# Patient Record
Sex: Male | Born: 1955 | Race: White | Hispanic: No | Smoking: Former smoker
Health system: Southern US, Community
[De-identification: ages and names within clinical notes are randomized; demographics above are authoritative.]

## PROBLEM LIST (undated history)

## (undated) DIAGNOSIS — I10 Essential (primary) hypertension: Secondary | ICD-10-CM

## (undated) DIAGNOSIS — T148XXA Other injury of unspecified body region, initial encounter: Secondary | ICD-10-CM

## (undated) DIAGNOSIS — E119 Type 2 diabetes mellitus without complications: Secondary | ICD-10-CM

## (undated) DIAGNOSIS — L24A9 Irritant contact dermatitis due friction or contact with other specified body fluids: Secondary | ICD-10-CM

## (undated) DIAGNOSIS — M1990 Unspecified osteoarthritis, unspecified site: Secondary | ICD-10-CM

## (undated) HISTORY — PX: NO PAST SURGERIES: SHX2092

## (undated) HISTORY — PX: COLONOSCOPY: SHX174

---

## 1998-08-20 ENCOUNTER — Emergency Department (HOSPITAL_COMMUNITY): Admission: EM | Admit: 1998-08-20 | Discharge: 1998-08-20 | Payer: Self-pay | Admitting: Emergency Medicine

## 1998-08-20 ENCOUNTER — Encounter: Payer: Self-pay | Admitting: Emergency Medicine

## 2006-01-23 ENCOUNTER — Ambulatory Visit (HOSPITAL_COMMUNITY): Admission: RE | Admit: 2006-01-23 | Discharge: 2006-01-23 | Payer: Self-pay | Admitting: Family Medicine

## 2006-04-27 ENCOUNTER — Encounter (HOSPITAL_BASED_OUTPATIENT_CLINIC_OR_DEPARTMENT_OTHER): Admission: RE | Admit: 2006-04-27 | Discharge: 2006-07-26 | Payer: Self-pay | Admitting: Surgery

## 2006-07-02 ENCOUNTER — Encounter: Admission: RE | Admit: 2006-07-02 | Discharge: 2006-07-02 | Payer: Self-pay | Admitting: Family Medicine

## 2006-07-30 ENCOUNTER — Encounter (HOSPITAL_BASED_OUTPATIENT_CLINIC_OR_DEPARTMENT_OTHER): Admission: RE | Admit: 2006-07-30 | Discharge: 2006-10-28 | Payer: Self-pay | Admitting: Internal Medicine

## 2013-07-09 ENCOUNTER — Inpatient Hospital Stay (HOSPITAL_COMMUNITY)
Admission: EM | Admit: 2013-07-09 | Discharge: 2013-07-18 | DRG: 728 | Disposition: A | Discharge status: Home Health | Payer: Medicare HMO | Attending: Internal Medicine | Admitting: Internal Medicine

## 2013-07-09 ENCOUNTER — Encounter (HOSPITAL_COMMUNITY): Payer: Self-pay | Admitting: Emergency Medicine

## 2013-07-09 DIAGNOSIS — Z6841 Body Mass Index (BMI) 40.0 and over, adult: Secondary | ICD-10-CM

## 2013-07-09 DIAGNOSIS — Z79899 Other long term (current) drug therapy: Secondary | ICD-10-CM

## 2013-07-09 DIAGNOSIS — E1165 Type 2 diabetes mellitus with hyperglycemia: Secondary | ICD-10-CM

## 2013-07-09 DIAGNOSIS — F17200 Nicotine dependence, unspecified, uncomplicated: Secondary | ICD-10-CM | POA: Diagnosis present

## 2013-07-09 DIAGNOSIS — R609 Edema, unspecified: Secondary | ICD-10-CM | POA: Diagnosis present

## 2013-07-09 DIAGNOSIS — N498 Inflammatory disorders of other specified male genital organs: Principal | ICD-10-CM | POA: Diagnosis present

## 2013-07-09 DIAGNOSIS — I861 Scrotal varices: Secondary | ICD-10-CM | POA: Diagnosis present

## 2013-07-09 DIAGNOSIS — D72829 Elevated white blood cell count, unspecified: Secondary | ICD-10-CM | POA: Diagnosis present

## 2013-07-09 DIAGNOSIS — R319 Hematuria, unspecified: Secondary | ICD-10-CM | POA: Diagnosis present

## 2013-07-09 DIAGNOSIS — N433 Hydrocele, unspecified: Secondary | ICD-10-CM | POA: Diagnosis present

## 2013-07-09 DIAGNOSIS — IMO0002 Reserved for concepts with insufficient information to code with codable children: Secondary | ICD-10-CM | POA: Diagnosis present

## 2013-07-09 DIAGNOSIS — I872 Venous insufficiency (chronic) (peripheral): Secondary | ICD-10-CM | POA: Diagnosis present

## 2013-07-09 DIAGNOSIS — IMO0001 Reserved for inherently not codable concepts without codable children: Secondary | ICD-10-CM

## 2013-07-09 DIAGNOSIS — L97909 Non-pressure chronic ulcer of unspecified part of unspecified lower leg with unspecified severity: Secondary | ICD-10-CM | POA: Diagnosis present

## 2013-07-09 DIAGNOSIS — N492 Inflammatory disorders of scrotum: Secondary | ICD-10-CM | POA: Diagnosis present

## 2013-07-09 HISTORY — DX: Type 2 diabetes mellitus without complications: E11.9

## 2013-07-09 NOTE — ED Notes (Signed)
Pt arrived to the ED with a complaint of testicle pain.  Pt states that the pain started on Friday.  Pt wife states that the color has been red but that one time today she states they appeared bluish.  Pt states that he has been having difficulty urinating.  Pt states it has been painful to urinate.

## 2013-07-10 ENCOUNTER — Emergency Department (HOSPITAL_COMMUNITY): Payer: Medicare HMO

## 2013-07-10 ENCOUNTER — Encounter (HOSPITAL_COMMUNITY): Payer: Self-pay

## 2013-07-10 DIAGNOSIS — N492 Inflammatory disorders of scrotum: Secondary | ICD-10-CM | POA: Diagnosis present

## 2013-07-10 DIAGNOSIS — N498 Inflammatory disorders of other specified male genital organs: Principal | ICD-10-CM

## 2013-07-10 LAB — POCT I-STAT, CHEM 8
BUN: 27 mg/dL — ABNORMAL HIGH (ref 6–23)
Calcium, Ion: 1.09 mmol/L — ABNORMAL LOW (ref 1.12–1.23)
Chloride: 100 mEq/L (ref 96–112)
Creatinine, Ser: 1 mg/dL (ref 0.50–1.35)
Glucose, Bld: 363 mg/dL — ABNORMAL HIGH (ref 70–99)
HCT: 50 % (ref 39.0–52.0)
Hemoglobin: 17 g/dL (ref 13.0–17.0)
Potassium: 4.1 mEq/L (ref 3.7–5.3)
Sodium: 133 mEq/L — ABNORMAL LOW (ref 137–147)
TCO2: 21 mmol/L (ref 0–100)

## 2013-07-10 LAB — URINALYSIS, ROUTINE W REFLEX MICROSCOPIC
Glucose, UA: >1000 mg/dL — AB
Hgb urine dipstick: NEGATIVE
Ketones, ur: 15 mg/dL — AB
Nitrite: NEGATIVE
Protein, ur: 100 mg/dL — AB
Specific Gravity, Urine: >1.046 — ABNORMAL HIGH (ref 1.005–1.030)
Urobilinogen, UA: 4 mg/dL — ABNORMAL HIGH (ref 0.0–1.0)
pH: 5 (ref 5.0–8.0)

## 2013-07-10 LAB — CBC
HCT: 45.5 % (ref 39.0–52.0)
Hemoglobin: 15.8 g/dL (ref 13.0–17.0)
MCH: 29.6 pg (ref 26.0–34.0)
MCHC: 34.7 g/dL (ref 30.0–36.0)
MCV: 85.4 fL (ref 78.0–100.0)
Platelets: 177 10*3/uL (ref 150–400)
RBC: 5.33 MIL/uL (ref 4.22–5.81)
RDW: 14.1 % (ref 11.5–15.5)
WBC: 19.4 10*3/uL — ABNORMAL HIGH (ref 4.0–10.5)

## 2013-07-10 LAB — COMPREHENSIVE METABOLIC PANEL
ALT: 12 U/L (ref 0–53)
AST: 13 U/L (ref 0–37)
Albumin: 2.9 g/dL — ABNORMAL LOW (ref 3.5–5.2)
Alkaline Phosphatase: 103 U/L (ref 39–117)
BUN: 24 mg/dL — ABNORMAL HIGH (ref 6–23)
CO2: 20 mEq/L (ref 19–32)
Calcium: 8.5 mg/dL (ref 8.4–10.5)
Chloride: 95 mEq/L — ABNORMAL LOW (ref 96–112)
Creatinine, Ser: 0.99 mg/dL (ref 0.50–1.35)
GFR calc Af Amer: >90 mL/min (ref >90)
GFR calc non Af Amer: 90 mL/min — ABNORMAL LOW (ref >90)
Glucose, Bld: 358 mg/dL — ABNORMAL HIGH (ref 70–99)
Potassium: 4.3 mEq/L (ref 3.7–5.3)
Sodium: 132 mEq/L — ABNORMAL LOW (ref 137–147)
Total Bilirubin: 1.3 mg/dL — ABNORMAL HIGH (ref 0.3–1.2)
Total Protein: 7.7 g/dL (ref 6.0–8.3)

## 2013-07-10 LAB — BASIC METABOLIC PANEL
BUN: 24 mg/dL — ABNORMAL HIGH (ref 6–23)
CO2: 23 mEq/L (ref 19–32)
Calcium: 8.4 mg/dL (ref 8.4–10.5)
Chloride: 97 mEq/L (ref 96–112)
Creatinine, Ser: 0.83 mg/dL (ref 0.50–1.35)
GFR calc Af Amer: >90 mL/min (ref >90)
GFR calc non Af Amer: >90 mL/min (ref >90)
Glucose, Bld: 321 mg/dL — ABNORMAL HIGH (ref 70–99)
Potassium: 4.1 mEq/L (ref 3.7–5.3)
Sodium: 132 mEq/L — ABNORMAL LOW (ref 137–147)

## 2013-07-10 LAB — HEMOGLOBIN A1C
Hgb A1c MFr Bld: 11 % — ABNORMAL HIGH (ref <5.7)
Mean Plasma Glucose: 269 mg/dL — ABNORMAL HIGH (ref <117)

## 2013-07-10 LAB — URINE MICROSCOPIC-ADD ON

## 2013-07-10 LAB — GLUCOSE, CAPILLARY: Glucose-Capillary: 308 mg/dL — ABNORMAL HIGH (ref 70–99)

## 2013-07-10 LAB — CG4 I-STAT (LACTIC ACID): Lactic Acid, Venous: 2.06 mmol/L (ref 0.5–2.2)

## 2013-07-10 MED ORDER — ONDANSETRON HCL 4 MG/2ML IJ SOLN
4.0000 mg | Freq: Once | INTRAMUSCULAR | Status: AC
  Administered 2013-07-10: 4 mg via INTRAVENOUS
  Filled 2013-07-10: qty 2

## 2013-07-10 MED ORDER — ZOLPIDEM TARTRATE 5 MG PO TABS: 5.0000 mg | ORAL_TABLET | Freq: Every evening | ORAL | Status: DC | PRN

## 2013-07-10 MED ORDER — SODIUM CHLORIDE 0.9 % IV SOLN
INTRAVENOUS | Status: DC
  Administered 2013-07-10 – 2013-07-18 (×6): via INTRAVENOUS

## 2013-07-10 MED ORDER — POLYETHYLENE GLYCOL 3350 17 G PO PACK
17.0000 g | PACK | Freq: Every day | ORAL | Status: DC | PRN
  Filled 2013-07-10: qty 1

## 2013-07-10 MED ORDER — INSULIN ASPART 100 UNIT/ML ~~LOC~~ SOLN
0.0000 [IU] | Freq: Three times a day (TID) | SUBCUTANEOUS | Status: DC
  Administered 2013-07-10: 15 [IU] via SUBCUTANEOUS
  Administered 2013-07-11 (×3): 11 [IU] via SUBCUTANEOUS
  Administered 2013-07-12: 7 [IU] via SUBCUTANEOUS
  Administered 2013-07-12: 11 [IU] via SUBCUTANEOUS
  Administered 2013-07-12: 15:00:00 via SUBCUTANEOUS
  Administered 2013-07-13 (×3): 7 [IU] via SUBCUTANEOUS
  Administered 2013-07-14 (×3): 4 [IU] via SUBCUTANEOUS
  Administered 2013-07-15 (×2): 3 [IU] via SUBCUTANEOUS
  Administered 2013-07-16: 4 [IU] via SUBCUTANEOUS
  Administered 2013-07-16 – 2013-07-17 (×3): 3 [IU] via SUBCUTANEOUS

## 2013-07-10 MED ORDER — OXYCODONE HCL 5 MG PO TABS
5.0000 mg | ORAL_TABLET | ORAL | Status: DC | PRN
  Administered 2013-07-10 – 2013-07-16 (×7): 5 mg via ORAL
  Filled 2013-07-10 (×7): qty 1

## 2013-07-10 MED ORDER — HYDROMORPHONE HCL PF 1 MG/ML IJ SOLN
1.0000 mg | INTRAMUSCULAR | Status: DC | PRN
  Administered 2013-07-10 – 2013-07-17 (×7): 1 mg via INTRAVENOUS
  Filled 2013-07-10 (×8): qty 1

## 2013-07-10 MED ORDER — PIPERACILLIN-TAZOBACTAM 3.375 G IVPB
3.3750 g | Freq: Three times a day (TID) | INTRAVENOUS | Status: DC
  Filled 2013-07-10: qty 50

## 2013-07-10 MED ORDER — ONDANSETRON HCL 4 MG PO TABS: 4.0000 mg | ORAL_TABLET | Freq: Four times a day (QID) | ORAL | Status: DC | PRN

## 2013-07-10 MED ORDER — ONDANSETRON HCL 4 MG/2ML IJ SOLN: 4.0000 mg | Freq: Four times a day (QID) | INTRAMUSCULAR | Status: DC | PRN

## 2013-07-10 MED ORDER — PIPERACILLIN-TAZOBACTAM 3.375 G IVPB
3.3750 g | Freq: Four times a day (QID) | INTRAVENOUS | Status: DC
  Filled 2013-07-10 (×2): qty 50

## 2013-07-10 MED ORDER — PIPERACILLIN-TAZOBACTAM 3.375 G IVPB
3.3750 g | Freq: Four times a day (QID) | INTRAVENOUS | Status: DC
  Administered 2013-07-10 – 2013-07-18 (×31): 3.375 g via INTRAVENOUS
  Filled 2013-07-10 (×36): qty 50

## 2013-07-10 MED ORDER — VANCOMYCIN HCL 10 G IV SOLR
2000.0000 mg | Freq: Once | INTRAVENOUS | Status: AC
  Administered 2013-07-10: 2000 mg via INTRAVENOUS
  Filled 2013-07-10: qty 2000

## 2013-07-10 MED ORDER — VANCOMYCIN HCL 10 G IV SOLR
1500.0000 mg | Freq: Two times a day (BID) | INTRAVENOUS | Status: DC
  Administered 2013-07-10 – 2013-07-12 (×5): 1500 mg via INTRAVENOUS
  Filled 2013-07-10 (×5): qty 1500

## 2013-07-10 MED ORDER — PIPERACILLIN-TAZOBACTAM 3.375 G IVPB 30 MIN
3.3750 g | INTRAVENOUS | Status: AC
  Administered 2013-07-10: 3.375 g via INTRAVENOUS
  Filled 2013-07-10: qty 50

## 2013-07-10 MED ORDER — PIPERACILLIN-TAZOBACTAM 3.375 G IVPB
3.3750 g | Freq: Once | INTRAVENOUS | Status: AC
  Administered 2013-07-10: 3.375 g via INTRAVENOUS
  Filled 2013-07-10: qty 50

## 2013-07-10 MED ORDER — INSULIN ASPART 100 UNIT/ML ~~LOC~~ SOLN
0.0000 [IU] | Freq: Every day | SUBCUTANEOUS | Status: DC
  Administered 2013-07-10 – 2013-07-12 (×3): 3 [IU] via SUBCUTANEOUS
  Administered 2013-07-13 – 2013-07-16 (×2): 2 [IU] via SUBCUTANEOUS

## 2013-07-10 MED ORDER — FENTANYL CITRATE 0.05 MG/ML IJ SOLN
50.0000 ug | INTRAMUSCULAR | Status: DC | PRN
  Administered 2013-07-10 (×2): 50 ug via INTRAVENOUS
  Filled 2013-07-10 (×2): qty 2

## 2013-07-10 MED ORDER — IOHEXOL 300 MG/ML  SOLN
100.0000 mL | Freq: Once | INTRAMUSCULAR | Status: AC | PRN
  Administered 2013-07-10: 100 mL via INTRAVENOUS

## 2013-07-10 NOTE — ED Notes (Signed)
Patient has been transferred to a hospital bed.

## 2013-07-10 NOTE — Consult Note (Signed)
Urology Consult  CC: Referring physician: Dr. Rod Can Reason for referral: Scrotal cellulitis  History of Present Illness: [name] is a 58 year old male who reports he has never been previously diagnosed with diabetes. 2 days ago he began to have some discomfort in the area of the scrotum. This progressed to pain. It was not associated with any urinary symptoms such as dysuria or hematuria. He also denies any objective fever but felt as if he might have had a low-grade fever while at home. He has never had cellulitis in the past nor has he had any prior urologic difficulties. He reports there has been swelling of the scrotum. The pain in his scrotum is not relieved by positional change and is worsened by palpation. It's moderate in severity. He has been found to have an elevated white count and elevated serum glucose.  History reviewed. No pertinent past medical history. History reviewed. No pertinent past surgical history.  Medications: Prior to Admission:  (Not in a hospital admission) Scheduled:  Continuous:   Allergies: No Known Allergies  History reviewed. No pertinent family history.  Social History:  reports that he has been smoking Cigarettes.  He has been smoking about 1.00 pack per day. He does not have any smokeless tobacco history on file. He reports that he does not drink alcohol or use illicit drugs.  Review of Systems: Pertinent items are noted in HPI. A comprehensive review of systems was negative except as noted above.  Physical Exam:  Vital signs in last 24 hours: Temp:  [98.6 F (37 C)-98.8 F (37.1 C)] 98.6 F (37 C) (01/26 0528) Pulse Rate:  [89-98] 98 (01/26 0528) Resp:  [17-20] 20 (01/26 0528) BP: (98-107)/(53-72) 98/53 mmHg (01/26 0528) SpO2:  [90 %-96 %] 90 % (01/26 0528) Weight:  [204.119 kg (450 lb)] 204.119 kg (450 lb) (01/25 2110) General appearance: alert and appears stated age Head: Normocephalic, without obvious abnormality, atraumatic Eyes:  conjunctivae/corneas clear. EOM's intact.  Oropharynx: moist mucous membranes Neck: supple, symmetrical, trachea midline Resp: normal respiratory effort Cardio: regular rate and rhythm Back: symmetric, no curvature. ROM normal. No CVA tenderness. GI: soft, obese, non-tender; bowel sounds normal; no masses,  no organomegaly  Male genitalia: Phallus is enveloped by the scrotal skin but palpable. I was able to visualize the glans and it appeared normal. The scrotal skin is edematous with no crepitus or fluctuance. It is mildly erythematous and warm. It is slightly tender. The testis are difficult to palpate do to scrotal wall edema. Extremities: extremities normal, atraumatic, no cyanosis or edema Skin: Skin color normal. He has significant lower extremity venous stasis changes. Neurologic: Grossly normal  Laboratory Data:   Recent Labs  07/10/13 0035 07/10/13 0057  WBC 19.4*  --   HGB 15.8 17.0  HCT 45.5 50.0   BMET  Recent Labs  07/10/13 0035 07/10/13 0057  NA 132* 133*  K 4.3 4.1  CL 95* 100  CO2 20  --   GLUCOSE 358* 363*  BUN 24* 27*  CREATININE 0.99 1.00  CALCIUM 8.5  --    No results found for this basename: LABPT, INR,  in the last 72 hours No results found for this basename: LABURIN,  in the last 72 hours No results found for this or any previous visit. Creatinine:  Recent Labs  07/10/13 0035 07/10/13 0057  CREATININE 0.99 1.00    Imaging: Ct Pelvis W Contrast  07/10/2013   CLINICAL DATA:  Testicular pain and discoloration  EXAM: CT PELVIS  WITH CONTRAST  TECHNIQUE: Multidetector CT imaging of the pelvis was performed using the standard protocol following the bolus administration of intravenous contrast.  CONTRAST:  100mL OMNIPAQUE IOHEXOL 300 MG/ML  SOLN  COMPARISON:  07/10/2013 ultrasound  FINDINGS: Visualized intraperitoneal contents within normal limits.  Left greater than right pelvic sidewall adenopathy, measuring 2.7 cm on the left and 1.6 cm on the  right. Prominent bilateral inguinal chain adenopathy, left greater than right.  Marked scrotal wall edema and subcutaneous fat stranding of the perineum. No subcutaneous gas. No significant inguinal hernia.  No acute osseous finding.  No appreciable vascular abnormality.  IMPRESSION: Scrotal wall thickening/ edema. May reflect cellulitis. Fournier's gangrene not excluded.  Enlarged pelvic and inguinal lymph nodes, most pronounced along the left pelvic sidewall. Recommend follow up to document resolution/ exclude malignancy.   Electronically Signed   By: Jearld LeschAndrew  DelGaizo M.D.   On: 07/10/2013 03:01   [place] Scrotum  07/10/2013   CLINICAL DATA:  Testicular pain and swelling  EXAM: SCROTAL ULTRASOUND  DOPPLER ULTRASOUND OF THE TESTICLES  TECHNIQUE: Complete ultrasound examination of the testicles, epididymis, and other scrotal structures was performed. Color and spectral Doppler ultrasound were also utilized to evaluate blood flow to the testicles.  COMPARISON:  None.  FINDINGS: Right testicle  Measurements: 4.3 x 2.0 x 2.9 cm. No mass or microlithiasis visualized.  Left testicle  Measurements: 3.8 x 2.2 x 2.7 cm. No mass or microlithiasis visualized.  Right epididymis:  Normal in size and appearance.  Left epididymis:  Normal in size and appearance.  Hydrocele:  Trace on the left.  Varicocele:  None visualized.  Pulsed Doppler interrogation of both testes demonstrates low resistance arterial and venous waveforms bilaterally.  Other:  Diffuse scrotal wall edema.  IMPRESSION: Normal sonographic appearance to the testicles. Color Doppler flow with arterial and venous waveforms documented bilaterally.  Diffuse scrotal wall edema.   Electronically Signed   By: Jearld LeschAndrew  DelGaizo M.D.   On: 07/10/2013 01:07   [place] Art/ven Flow Abd Pelv Doppler  07/10/2013   CLINICAL DATA:  Testicular pain and swelling  EXAM: SCROTAL ULTRASOUND  DOPPLER ULTRASOUND OF THE TESTICLES  TECHNIQUE: Complete ultrasound examination of the testicles,  epididymis, and other scrotal structures was performed. Color and spectral Doppler ultrasound were also utilized to evaluate blood flow to the testicles.  COMPARISON:  None.  FINDINGS: Right testicle  Measurements: 4.3 x 2.0 x 2.9 cm. No mass or microlithiasis visualized.  Left testicle  Measurements: 3.8 x 2.2 x 2.7 cm. No mass or microlithiasis visualized.  Right epididymis:  Normal in size and appearance.  Left epididymis:  Normal in size and appearance.  Hydrocele:  Trace on the left.  Varicocele:  None visualized.  Pulsed Doppler interrogation of both testes demonstrates low resistance arterial and venous waveforms bilaterally.  Other:  Diffuse scrotal wall edema.  IMPRESSION: Normal sonographic appearance to the testicles. Color Doppler flow with arterial and venous waveforms documented bilaterally.  Diffuse scrotal wall edema.   Electronically Signed   By: Jearld LeschAndrew  DelGaizo M.D.   On: 07/10/2013 01:07   I have reviewed his CT scan and scrotal ultrasound images. The testicles appear normal and there appears to be no evidence of abscess or gas within the scrotal skin.  Impression/Assessment:  Scrotal cellulitis: It appears he has a significant scrotal cellulitis with edema of the scrotal skin but there is no evidence of crepitus or fluctuance and therefore no need for any form of surgical intervention at this time. I  have discussed with the patient that this condition can progress to a point where surgical intervention may be necessary despite appropriate antibiotic therapy although it would appear this has been discovered and is being treated adequately at a stage therefore abscess formation or Fournier's gangrene has formed. His white count is elevated and although he has not been diagnosed with diabetes previously it would appear from his blood sugar that he may be diabetic as well. He is currently on Zosyn and vancomycin which is adequate coverage.  Plan:  1. I agree completely with the current  antibiotic regimen. 2. Observation with serial examination but no need for surgical intervention at this time.  Bright Spielmann C 07/10/2013, 6:52 AM

## 2013-07-10 NOTE — Progress Notes (Addendum)
[facility] PROGRESS NOTE  [name] EVO:[id number] DOB: 24-Oct-1955 DOA: 07/09/2013 PCP: No primary provider on file.  Brief narrative: 58 year old male who presented to [facility] ED 07/09/2013 with scrotal edema. CT abd/pelvis revealed possible cellulitis and/or Fournier gangrene. Pt started on zosyn on admission. Normal scrotal ultrasound.  Assessment/Plan:  Principal Problem:   Cellulitis, scrotum - agree with the management of cellulitis with zosyn - appreciate GU consult and recommendations    Code Status: full code Family Communication: no family at the bedside Disposition Plan: home when stable   Manson Passey, MD  [facility] Pager [phone]  If 7PM-7AM, please contact night-coverage [email or web address] Password [email or web address] 07/10/2013, 6:12 PM   LOS: 1 day   Consultants:  Urology   Procedures:  None   Antibiotics:  Zosyn 07/10/2013 -->  vanco 07/10/2013 -->  HPI/Subjective: No acute events since admission.   Objective: Filed Vitals:   07/09/13 2110 07/10/13 0528 07/10/13 1123 07/10/13 1503  BP: 107/72 98/53 108/54 123/73  Pulse: 89 98 96 97  Temp: 98.8 F (37.1 C) 98.6 F (37 C) 98.8 F (37.1 C) 98.6 F (37 C)  TempSrc: Oral Oral Oral Oral  Resp: 17 20 14 16   Height: 6\' 1"  (1.854 m)     Weight: 204.119 kg (450 lb)     SpO2: 96% 90% 92% 94%   No intake or output data in the 24 hours ending 07/10/13 1812  Exam:   General:  Pt is alert, follows commands appropriately, not in acute distress  Cardiovascular: Regular rate and rhythm, S1/S2 appreciated  Respiratory: Clear to auscultation bilaterally, no wheezing, no crackles, no rhonchi  Abdomen: Soft, non tender, non distended, bowel sounds present, no guarding  Extremities: No edema, pulses DP and PT palpable bilaterally; perineal area cellulitis  Neuro: Grossly nonfocal  Data Reviewed: Basic Metabolic Panel:  Recent Labs Lab 07/10/13 0035 07/10/13 0057 07/10/13 1421  NA 132* 133*  132*  K 4.3 4.1 4.1  CL 95* 100 97  CO2 20  --  23  GLUCOSE 358* 363* 321*  BUN 24* 27* 24*  CREATININE 0.99 1.00 0.83  CALCIUM 8.5  --  8.4   Liver Function Tests:  Recent Labs Lab 07/10/13 0035  AST 13  ALT 12  ALKPHOS 103  BILITOT 1.3*  PROT 7.7  ALBUMIN 2.9*   No results found for this basename: LIPASE, AMYLASE,  in the last 168 hours No results found for this basename: AMMONIA,  in the last 168 hours CBC:  Recent Labs Lab 07/10/13 0035 07/10/13 0057  WBC 19.4*  --   HGB 15.8 17.0  HCT 45.5 50.0  MCV 85.4  --   PLT 177  --    Cardiac Enzymes: No results found for this basename: CKTOTAL, CKMB, CKMBINDEX, TROPONINI,  in the last 168 hours BNP: No components found with this basename: POCBNP,  CBG:  Recent Labs Lab 07/10/13 1555  GLUCAP 308*    No results found for this or any previous visit (from the past 240 hour(s)).   Studies: Ct Pelvis W Contrast 07/10/2013    IMPRESSION: Scrotal wall thickening/ edema. May reflect cellulitis. Fournier's gangrene not excluded.  Enlarged pelvic and inguinal lymph nodes, most pronounced along the left pelvic sidewall. Recommend follow up to document resolution/ exclude malignancy.     US Scrotum 07/10/2013    IMPRESSION: Normal sonographic appearance to the testicles. Color Doppler flow with arterial and venous waveforms documented bilaterally.  Diffuse scrotal wall edema.  [place] Art/ven Flow Abd Pelv Doppler 07/10/2013    IMPRESSION: Normal sonographic appearance to the testicles. Color Doppler flow with arterial and venous waveforms documented bilaterally.  Diffuse scrotal wall edema.      Scheduled Meds: . insulin aspart  0-20 Units Subcutaneous TID WC  . insulin aspart  0-5 Units Subcutaneous QHS  . piperacillin-tazobactam (ZOSYN)  IV  3.375 g Intravenous Q6H  . vancomycin  1,500 mg Intravenous Q12H   Continuous Infusions: . sodium chloride 100 mL/hr at 07/10/13 [phone]

## 2013-07-10 NOTE — Progress Notes (Signed)
ANTIBIOTIC CONSULT NOTE - INITIAL  Pharmacy Consult for Zosyn, vancomycin Indication: scrotal cellulitis  No Known Allergies  Patient Measurements: Height: 6\' 1"  (185.4 cm) Weight: 450 lb (204.119 kg) IBW/kg (Calculated) : 79.9   Vital Signs: Temp: 98.8 F (37.1 C) (01/26 1123) Temp src: Oral (01/26 1123) BP: 108/54 mmHg (01/26 1123) Pulse Rate: 96 (01/26 1123) Intake/Output from previous day:   Intake/Output from this shift:    Labs:  Recent Labs  07/10/13 0035 07/10/13 0057  WBC 19.4*  --   HGB 15.8 17.0  PLT 177  --   CREATININE 0.99 1.00   Estimated Creatinine Clearance: 151.2 ml/min (by C-G formula based on Cr of 1). No results found for this basename: VANCOTROUGH, VANCOPEAK, VANCORANDOM, GENTTROUGH, GENTPEAK, GENTRANDOM, TOBRATROUGH, TOBRAPEAK, TOBRARND, AMIKACINPEAK, AMIKACINTROU, AMIKACIN,  in the last 72 hours   Microbiology: No results found for this or any previous visit (from the past 720 hour(s)).  Medical History: Morbid obesity   Medications:  Scheduled:  . insulin aspart  0-20 Units Subcutaneous TID WC  . insulin aspart  0-5 Units Subcutaneous QHS   Infusions:  . sodium chloride 100 mL/hr at 07/10/13 0640  . piperacillin-tazobactam    . piperacillin-tazobactam (ZOSYN)  IV    . vancomycin     PRN: HYDROmorphone (DILAUDID) injection, ondansetron (ZOFRAN) IV, ondansetron, oxyCODONE, zolpidem  Assessment: 58 y/o M presented to ED with scrotal cellulitis.  Has already received Zosyn 3.375 grams IV x1 (0113) and vancomycin 2000 mg IV x 1 (0200).  Orders received to continue both antibiotics with pharmacy dosing.  Risk factors for Fournier's gangrene as outlined by MD are noted (morbid obesity, hyperglycemia with likely underlying DM).  Goal of Therapy:   Proper antibiotic dosing for weight and renal function; eradication of infection  Vancomycin trough 15-20  Plan:  1. Repeat Zosyn 3.375 grams IV over 30 mins x 1 stat 2. Zosyn 3.375 grams  IV q6h by extended infusion, each dose over 4 hours.  (Empirically increased dosage from the standard q8h extended infusion regimen due to morbid obesity). 3. Vancomycin 1500 mg IV q12h, next dose at 2pm 4. Check vancomycin trough at steady-state 5. Follow serum creatinine, any culture data, and clinical course.   Elie Goody, PharmD, BCPS Pager: [phone] 07/10/2013  12:56 PM

## 2013-07-10 NOTE — Progress Notes (Signed)
Inpatient Diabetes Program Recommendations  AACE/ADA: New Consensus Statement on Inpatient Glycemic Control (2013)  Target Ranges:  Prepandial:   less than 140 mg/dL      Peak postprandial:   less than 180 mg/dL (1-2 hours)      Critically ill patients:  140 - 180 mg/dL   Reason for Visit: Hyperglycemia  Briefly spoke with pt about hyperglycemia.  Awaiting HgbA1C results for firm diagnosis of DM.  Pt has symptoms of hyperglycemia , polyuria, polydipsia and has felt sluggish with no energy in last few weeks. Has positive family hx DM. Encouraged pt to view diabetes videos on pt ed channel and will order Living Well With Diabetes book. Pt interested in OP Diabetes Education. Discussed HgbA1C meaning very briefly.  Results for AMEN, STASZAK (MRN [id number]) as of 07/10/2013 17:25  Ref. Range 07/10/2013 15:55  Glucose-Capillary Latest Range: 70-99 mg/dL 308 (H)  Results for ABRHAM, MASLOWSKI (MRN [id number]) as of 07/10/2013 17:25  Ref. Range 07/10/2013 14:21  Sodium Latest Range: 137-147 mEq/L 132 (L)  Potassium Latest Range: 3.7-5.3 mEq/L 4.1  Chloride Latest Range: 96-112 mEq/L 97  CO2 Latest Range: 19-32 mEq/L 23  BUN Latest Range: 6-23 mg/dL 24 (H)  Creatinine Latest Range: 0.50-1.35 mg/dL 0.83  Calcium Latest Range: 8.4-10.5 mg/dL 8.4  GFR calc non Af Amer Latest Range: >90 mL/min >90  GFR calc Af Amer Latest Range: >90 mL/min >90  Glucose Latest Range: 70-99 mg/dL 321 (H)   Results for RITHIK, ODEA (MRN [id number]) as of 07/10/2013 17:25  Ref. Range 07/10/2013 00:35 07/10/2013 00:57 07/10/2013 14:21  Glucose Latest Range: 70-99 mg/dL 358 (H) 363 (H) 321 (H)   ?new diagnosis of DM. Will f/u in am with HgbA1C results.   Will probably need to go home on insulin.  If so will order starter kit and RN to begin education.  Thank you. Lorenda Peck, RD, LDN, CDE Inpatient Diabetes Coordinator [phone]

## 2013-07-10 NOTE — ED Provider Notes (Signed)
CSN: [id number]     Arrival date & time 07/09/13  2104 History   First MD Initiated Contact with Patient 07/09/13 2353     Chief Complaint  Patient presents with  . Testicle Pain   (Consider location/radiation/quality/duration/timing/severity/associated sxs/prior Treatment) HPI History provided by patient. Testicle pain and swelling and redness onset 3 days ago and progressively worsening. He has associated hard knot left inguinal region. He denies history of trauma or history of hernia in the past. He presents tonight because now he has associated difficulty urinating. He had some hematuria at home tonight. No dysuria. No urgency or frequency. No urinary retention. No abdominal pain otherwise. No back pain. No fevers or chills. No nausea vomiting. No history of same. is not diabetic.  History reviewed. No pertinent past medical history. History reviewed. No pertinent past surgical history. History reviewed. No pertinent family history. History  Substance Use Topics  . Smoking status: Current Some Day Smoker -- 1.00 packs/day    Types: Cigarettes  . Smokeless tobacco: Not on file  . Alcohol Use: No    Review of Systems  Constitutional: Negative for fever and chills.  Respiratory: Negative for shortness of breath.   Cardiovascular: Negative for chest pain.  Gastrointestinal: Negative for abdominal pain.  Genitourinary: Positive for hematuria, scrotal swelling, difficulty urinating and testicular pain.  Musculoskeletal: Negative for back pain.  Skin: Positive for rash.  Neurological: Negative for headaches.  All other systems reviewed and are negative.    Allergies  Review of patient's allergies indicates no known allergies.  Home Medications   Current Outpatient Rx  Name  Route  Sig  Dispense  Refill  . diphenhydrAMINE (BENADRYL) 25 MG tablet   Oral   Take 25 mg by mouth every 6 (six) hours as needed for itching or allergies.         . furosemide (LASIX) 20 MG tablet  Oral   Take 20 mg by mouth daily as needed for edema.         . naproxen sodium (ANAPROX) 220 MG tablet   Oral   Take 440 mg by mouth 2 (two) times daily as needed (pain).          BP 107/72  Pulse 89  Temp(Src) 98.8 F (37.1 C) (Oral)  Resp 17  Ht 6\' 1"  (1.854 m)  Wt 450 lb (204.119 kg)  BMI 59.38 kg/m2  SpO2 96% Physical Exam  Constitutional: He is oriented to person, place, and time. He appears well-developed and well-nourished.  HENT:  Head: Normocephalic and atraumatic.  Eyes: EOM are normal. Pupils are equal, round, and reactive to light.  Neck: Neck supple.  Cardiovascular: Normal rate, regular rhythm and intact distal pulses.   Pulmonary/Chest: Effort normal and breath sounds normal. No respiratory distress.  Abdominal: Soft. He exhibits no distension. There is no tenderness.  Obese  Genitourinary:  Scrotum is enlarged erythematous. No crepitus. Erythema extends to the inguinal region. There is fullness in the left inguinal region with mild tenderness. No reducible hernia.  Musculoskeletal: Normal range of motion. He exhibits no edema.  Neurological: He is alert and oriented to person, place, and time.  Skin: Skin is warm and dry.    ED Course  Procedures (including critical care time) Labs Review Labs Reviewed  CBC - Abnormal; Notable for the following:    WBC 19.4 (*)    All other components within normal limits  URINALYSIS, ROUTINE W REFLEX MICROSCOPIC - Abnormal; Notable for the following:  Color, Urine AMBER (*)    APPearance CLOUDY (*)    Specific Gravity, Urine >1.046 (*)    Glucose, UA >1000 (*)    Bilirubin Urine MODERATE (*)    Ketones, ur 15 (*)    Protein, ur 100 (*)    Urobilinogen, UA 4.0 (*)    Leukocytes, UA TRACE (*)    All other components within normal limits  COMPREHENSIVE METABOLIC PANEL - Abnormal; Notable for the following:    Sodium 132 (*)    Chloride 95 (*)    Glucose, Bld 358 (*)    BUN 24 (*)    Albumin 2.9 (*)     Total Bilirubin 1.3 (*)    GFR calc non Af Amer 90 (*)    All other components within normal limits  URINE MICROSCOPIC-ADD ON - Abnormal; Notable for the following:    Squamous Epithelial / LPF FEW (*)    Casts GRANULAR CAST (*)    All other components within normal limits  POCT I-STAT, CHEM 8 - Abnormal; Notable for the following:    Sodium 133 (*)    BUN 27 (*)    Glucose, Bld 363 (*)    Calcium, Ion 1.09 (*)    All other components within normal limits  URINALYSIS, ROUTINE W REFLEX MICROSCOPIC  CG4 I-STAT (LACTIC ACID)   Imaging Review Ct Pelvis W Contrast  07/10/2013   CLINICAL DATA:  Testicular pain and discoloration  EXAM: CT PELVIS WITH CONTRAST  TECHNIQUE: Multidetector CT imaging of the pelvis was performed using the standard protocol following the bolus administration of intravenous contrast.  CONTRAST:  100mL OMNIPAQUE IOHEXOL 300 MG/ML  SOLN  COMPARISON:  07/10/2013 ultrasound  FINDINGS: Visualized intraperitoneal contents within normal limits.  Left greater than right pelvic sidewall adenopathy, measuring 2.7 cm on the left and 1.6 cm on the right. Prominent bilateral inguinal chain adenopathy, left greater than right.  Marked scrotal wall edema and subcutaneous fat stranding of the perineum. No subcutaneous gas. No significant inguinal hernia.  No acute osseous finding.  No appreciable vascular abnormality.  IMPRESSION: Scrotal wall thickening/ edema. May reflect cellulitis. Fournier's gangrene not excluded.  Enlarged pelvic and inguinal lymph nodes, most pronounced along the left pelvic sidewall. Recommend follow up to document resolution/ exclude malignancy.   Electronically Signed   By: Jearld LeschAndrew  DelGaizo M.D.   On: 07/10/2013 03:01   [place] Scrotum  07/10/2013   CLINICAL DATA:  Testicular pain and swelling  EXAM: SCROTAL ULTRASOUND  DOPPLER ULTRASOUND OF THE TESTICLES  TECHNIQUE: Complete ultrasound examination of the testicles, epididymis, and other scrotal structures was performed.  Color and spectral Doppler ultrasound were also utilized to evaluate blood flow to the testicles.  COMPARISON:  None.  FINDINGS: Right testicle  Measurements: 4.3 x 2.0 x 2.9 cm. No mass or microlithiasis visualized.  Left testicle  Measurements: 3.8 x 2.2 x 2.7 cm. No mass or microlithiasis visualized.  Right epididymis:  Normal in size and appearance.  Left epididymis:  Normal in size and appearance.  Hydrocele:  Trace on the left.  Varicocele:  None visualized.  Pulsed Doppler interrogation of both testes demonstrates low resistance arterial and venous waveforms bilaterally.  Other:  Diffuse scrotal wall edema.  IMPRESSION: Normal sonographic appearance to the testicles. Color Doppler flow with arterial and venous waveforms documented bilaterally.  Diffuse scrotal wall edema.   Electronically Signed   By: Jearld LeschAndrew  DelGaizo M.D.   On: 07/10/2013 01:07   [place] Art/ven Flow Abd Pelv Doppler  07/10/2013   CLINICAL DATA:  Testicular pain and swelling  EXAM: SCROTAL ULTRASOUND  DOPPLER ULTRASOUND OF THE TESTICLES  TECHNIQUE: Complete ultrasound examination of the testicles, epididymis, and other scrotal structures was performed. Color and spectral Doppler ultrasound were also utilized to evaluate blood flow to the testicles.  COMPARISON:  None.  FINDINGS: Right testicle  Measurements: 4.3 x 2.0 x 2.9 cm. No mass or microlithiasis visualized.  Left testicle  Measurements: 3.8 x 2.2 x 2.7 cm. No mass or microlithiasis visualized.  Right epididymis:  Normal in size and appearance.  Left epididymis:  Normal in size and appearance.  Hydrocele:  Trace on the left.  Varicocele:  None visualized.  Pulsed Doppler interrogation of both testes demonstrates low resistance arterial and venous waveforms bilaterally.  Other:  Diffuse scrotal wall edema.  IMPRESSION: Normal sonographic appearance to the testicles. Color Doppler flow with arterial and venous waveforms documented bilaterally.  Diffuse scrotal wall edema.   Electronically  Signed   By: Jearld Lesch M.D.   On: 07/10/2013 01:07   IVfs, IV fentanyl.IV ABx.  On recheck pain improved and is feeling better.   4:01 AM d/w Urology DR Vernie Ammons will evaluate. Plan MED admit  MDM  Diagnosis: Cellulitis of the scrotum, elevated blood sugar  Evaluated with ultrasound, labs, CT scan reviewed as above Treated with IV fluids, IV antibiotics, IV narcotics Urology consult in MED admit   Sunnie Nielsen, MD 07/10/13 [phone]

## 2013-07-10 NOTE — H&P (Signed)
[facility]  History and Physical [name] L. Matthew Snuffer, MD Pager [phone] (if 7P to 7A, page night hospitalist on amion.[name] BSJ:[id number] DOB: [birth year] DOA: 07/09/2013  Referring physician: ED  PCP: No primary provider on file.   Chief Complaint: scrotal cellulitis   HPI:  Pt presents w/ scrotal edema. U/S shows thickening wall but normal doppler parameters to r/o torsion and hydro/varicocele. CT A/P shows cellulitis w/ associated inguinal/pelvic LAD. Pt is afebrile  But does have leukocytosis and hyperglycemia. Van/zosyn given in ED.   Pt was also having complaints of hematuria. U/A pending at this time   Review of Systems:  Neg except as noted above   History reviewed. No pertinent past medical history.  History reviewed. No pertinent past surgical history.  Social History:  reports that he has been smoking Cigarettes.  He has been smoking about 1.00 pack per day. He does not have any smokeless tobacco history on file. He reports that he does not drink alcohol or use illicit drugs.  No Known Allergies  History reviewed. No pertinent family history.   Prior to Admission medications   Medication Sig Start Date End Date Taking? Authorizing Provider  diphenhydrAMINE (BENADRYL) 25 MG tablet Take 25 mg by mouth every 6 (six) hours as needed for itching or allergies.   Yes Historical Provider, MD  furosemide (LASIX) 20 MG tablet Take 20 mg by mouth daily as needed for edema.   Yes Historical Provider, MD  naproxen sodium (ANAPROX) 220 MG tablet Take 440 mg by mouth 2 (two) times daily as needed (pain).   Yes Historical Provider, MD   Physical Exam: Filed Vitals:   07/09/13 2110  BP: 107/72  Pulse: 89  Temp: 98.8 F (37.1 C)  TempSrc: Oral  Resp: 17  Height: 6\' 1"  (1.854 m)  Weight: 204.119 kg (450 lb)  SpO2: 96%     General:  WM in NAD   HEENT:  Anicteric sclera, MMM, partially edentulous  Cardiovascular: RRR, no MRG   Respiratory: CTAB, no  wheezes/rales   Abdomen: soft, NT, ND   Skin: scrotal edema. Lacy erythematous rash of UE B/L   Musculoskeletal: no focal deficits   GU:  Induration, edema, and erythema of scrotum. Induration and erythema spreads proximally on the L towards the inguinal region   Wt Readings from Last 3 Encounters:  07/09/13 204.119 kg (450 lb)    Labs on Admission:  Basic Metabolic Panel:  Recent Labs Lab 07/10/13 0035 07/10/13 0057  NA 132* 133*  K 4.3 4.1  CL 95* 100  CO2 20  --   GLUCOSE 358* 363*  BUN 24* 27*  CREATININE 0.99 1.00  CALCIUM 8.5  --     Liver Function Tests:  Recent Labs Lab 07/10/13 0035  AST 13  ALT 12  ALKPHOS 103  BILITOT 1.3*  PROT 7.7  ALBUMIN 2.9*   No results found for this basename: LIPASE, AMYLASE,  in the last 168 hours No results found for this basename: AMMONIA,  in the last 168 hours  CBC:  Recent Labs Lab 07/10/13 0035 07/10/13 0057  WBC 19.4*  --   HGB 15.8 17.0  HCT 45.5 50.0  MCV 85.4  --   PLT 177  --     Cardiac Enzymes: No results found for this basename: CKTOTAL, CKMB, CKMBINDEX, TROPONINI,  in the last 168 hours  Troponin (Point of Care Test) No results found for this basename: TROPIPOC,  in the last 72 hours  BNP (last 3 results) No results found for this basename: PROBNP,  in the last 8760 hours  CBG: No results found for this basename: GLUCAP,  in the last 168 hours   Radiological Exams on Admission: Ct Pelvis W Contrast  07/10/2013   CLINICAL DATA:  Testicular pain and discoloration  EXAM: CT PELVIS WITH CONTRAST  TECHNIQUE: Multidetector CT imaging of the pelvis was performed using the standard protocol following the bolus administration of intravenous contrast.  CONTRAST:  100mL OMNIPAQUE IOHEXOL 300 MG/ML  SOLN  COMPARISON:  07/10/2013 ultrasound  FINDINGS: Visualized intraperitoneal contents within normal limits.  Left greater than right pelvic sidewall adenopathy, measuring 2.7 cm on the left and 1.6 cm on  the right. Prominent bilateral inguinal chain adenopathy, left greater than right.  Marked scrotal wall edema and subcutaneous fat stranding of the perineum. No subcutaneous gas. No significant inguinal hernia.  No acute osseous finding.  No appreciable vascular abnormality.  IMPRESSION: Scrotal wall thickening/ edema. May reflect cellulitis. Fournier's gangrene not excluded.  Enlarged pelvic and inguinal lymph nodes, most pronounced along the left pelvic sidewall. Recommend follow up to document resolution/ exclude malignancy.   Electronically Signed   By: Jearld LeschAndrew  DelGaizo M.D.   On: 07/10/2013 03:01   [place] Scrotum  07/10/2013   CLINICAL DATA:  Testicular pain and swelling  EXAM: SCROTAL ULTRASOUND  DOPPLER ULTRASOUND OF THE TESTICLES  TECHNIQUE: Complete ultrasound examination of the testicles, epididymis, and other scrotal structures was performed. Color and spectral Doppler ultrasound were also utilized to evaluate blood flow to the testicles.  COMPARISON:  None.  FINDINGS: Right testicle  Measurements: 4.3 x 2.0 x 2.9 cm. No mass or microlithiasis visualized.  Left testicle  Measurements: 3.8 x 2.2 x 2.7 cm. No mass or microlithiasis visualized.  Right epididymis:  Normal in size and appearance.  Left epididymis:  Normal in size and appearance.  Hydrocele:  Trace on the left.  Varicocele:  None visualized.  Pulsed Doppler interrogation of both testes demonstrates low resistance arterial and venous waveforms bilaterally.  Other:  Diffuse scrotal wall edema.  IMPRESSION: Normal sonographic appearance to the testicles. Color Doppler flow with arterial and venous waveforms documented bilaterally.  Diffuse scrotal wall edema.   Electronically Signed   By: Jearld LeschAndrew  DelGaizo M.D.   On: 07/10/2013 01:07   [place] Art/ven Flow Abd Pelv Doppler  07/10/2013   CLINICAL DATA:  Testicular pain and swelling  EXAM: SCROTAL ULTRASOUND  DOPPLER ULTRASOUND OF THE TESTICLES  TECHNIQUE: Complete ultrasound examination of the  testicles, epididymis, and other scrotal structures was performed. Color and spectral Doppler ultrasound were also utilized to evaluate blood flow to the testicles.  COMPARISON:  None.  FINDINGS: Right testicle  Measurements: 4.3 x 2.0 x 2.9 cm. No mass or microlithiasis visualized.  Left testicle  Measurements: 3.8 x 2.2 x 2.7 cm. No mass or microlithiasis visualized.  Right epididymis:  Normal in size and appearance.  Left epididymis:  Normal in size and appearance.  Hydrocele:  Trace on the left.  Varicocele:  None visualized.  Pulsed Doppler interrogation of both testes demonstrates low resistance arterial and venous waveforms bilaterally.  Other:  Diffuse scrotal wall edema.  IMPRESSION: Normal sonographic appearance to the testicles. Color Doppler flow with arterial and venous waveforms documented bilaterally.  Diffuse scrotal wall edema.   Electronically Signed   By: Jearld LeschAndrew  DelGaizo M.D.   On: 07/10/2013 01:07        Assessment/Plan Scrotal cellulitis  - cont on vanc/zosyn. NS  at 100cc/hr. Dilaudid 2mg  q2hr IV prn pain control. Daily CBC. ED physician is consulting urology this morning given his risk for having fournier's gangrene (uncontrolled diabetes and the proximal advancement of the induration into the inguinal region)  Hyperglycemia - need to r/o DM. check A1C. SSI. IVF   Code Status: full Family Communication: significant other at bedside  Disposition Plan/Anticipated LOS: 5 days   Time spent: 40 minutes    Alysia Penna, MD  Internal Medicine Pager [phone] If 7PM-7AM, please contact night-coverage at [email or web address], password [facility] 07/10/2013, 3:31 AM

## 2013-07-11 DIAGNOSIS — IMO0001 Reserved for inherently not codable concepts without codable children: Secondary | ICD-10-CM

## 2013-07-11 DIAGNOSIS — E1165 Type 2 diabetes mellitus with hyperglycemia: Secondary | ICD-10-CM

## 2013-07-11 LAB — CBC
HCT: 40.3 % (ref 39.0–52.0)
Hemoglobin: 13.6 g/dL (ref 13.0–17.0)
MCH: 29.1 pg (ref 26.0–34.0)
MCHC: 33.7 g/dL (ref 30.0–36.0)
MCV: 86.3 fL (ref 78.0–100.0)
Platelets: 160 10*3/uL (ref 150–400)
RBC: 4.67 MIL/uL (ref 4.22–5.81)
RDW: 14.2 % (ref 11.5–15.5)
WBC: 14.7 10*3/uL — ABNORMAL HIGH (ref 4.0–10.5)

## 2013-07-11 LAB — GLUCOSE, CAPILLARY
Glucose-Capillary: 297 mg/dL — ABNORMAL HIGH (ref 70–99)
Glucose-Capillary: 288 mg/dL — ABNORMAL HIGH (ref 70–99)
Glucose-Capillary: 254 mg/dL — ABNORMAL HIGH (ref 70–99)
Glucose-Capillary: 211 mg/dL — ABNORMAL HIGH (ref 70–99)
Glucose-Capillary: 271 mg/dL — ABNORMAL HIGH (ref 70–99)

## 2013-07-11 MED ORDER — LIVING WELL WITH DIABETES BOOK
Freq: Once | Status: AC
  Administered 2013-07-11: 12:00:00
  Filled 2013-07-11: qty 1

## 2013-07-11 MED ORDER — BD GETTING STARTED TAKE HOME KIT: 1/2ML X 30G SYRINGES: 1.0000 | Freq: Once | Status: DC

## 2013-07-11 MED ORDER — BD GETTING STARTED TAKE HOME KIT: 3/10ML X 30G SYRINGES
1.0000 | Freq: Once | Status: AC
  Administered 2013-07-11: 1
  Filled 2013-07-11: qty 1

## 2013-07-11 NOTE — Progress Notes (Signed)
Patient evaluated for [facility] Care Management services. Confirmed at bedside patient's Primary Care MD as being Dr [name]. Explained services to patient who states he is not interested in a nurse calling him frequently and requesting home visits. States he has had experience in the past with an insurance company that had similar services and he changed insurance carriers because of it. He states it is bothersome and he is not interested at this time. Explained that [facility] Care Management could assist with education and so forth regarding his newly diagnosis of DM. Despite offer and explanation, patient declined. Left brochure at bedside and contact information for him to call in case he changes his mind. Will make inpatient RNCM aware of visit.  Matthew Noble, MSN, RN,[name]- [facility] [phone]

## 2013-07-11 NOTE — Progress Notes (Signed)
Patient ID: [name], male   DOB: 07/09/1955, 58 y.o.   MRN: [id number]  Subjective: The patient reports a discomfort in his scrotum has diminished. He indicates that there is still swelling of the scrotum. He was concerned about that. Is not having any other complaint.  Objective: Vital signs in last 24 hours: Temp:  [98.6 F (37 C)-99.1 F (37.3 C)] 99.1 F (37.3 C) (01/27 0600) Pulse Rate:  [74-97] 74 (01/27 0600) Resp:  [14-16] 16 (01/27 0600) BP: (108-148)/(54-76) 148/76 mmHg (01/27 0600) SpO2:  [92 %-94 %] 92 % (01/27 0600) Weight:  [192.552 kg (424 lb 8 oz)] 192.552 kg (424 lb 8 oz) (01/27 0600)A  Intake/Output from previous day: 01/26 0701 - 01/27 0700 In: 2258.3 [I.V.:2258.3] Out: 600 [Urine:600] Intake/Output this shift:    History reviewed. No pertinent past medical history.  Physical Exam:  Lungs - Normal respiratory effort, chest expands symmetrically.  Abdomen - Soft, non-tender & non-distended. GU - the scrotum remains swollen and edematous. There is erythema. It does extend into the left inguinal region with some induration in that location. There is no fluctuance or crepitus. It is mildly tender to palpation.  Lab Results:  Recent Labs  07/10/13 0035 07/10/13 0057 07/11/13 0525  WBC 19.4*  --  14.7*  HGB 15.8 17.0 13.6  HCT 45.5 50.0 40.3   BMET  Recent Labs  07/10/13 0035 07/10/13 0057 07/10/13 1421  NA 132* 133* 132*  K 4.3 4.1 4.1  CL 95* 100 97  CO2 20  --  23  GLUCOSE 358* 363* 321*  BUN 24* 27* 24*  CREATININE 0.99 1.00 0.83  CALCIUM 8.5  --  8.4   No results found for this basename: LABURIN,  in the last 72 hours No results found for this or any previous visit.  Studies/Results: @[id number] @  Assessment: Scrotal cellulitis - he has had clinical improvement with a decrease in discomfort in his scrotum. His examination continues to reveal no evidence of Fournier's gangrene or abscess formation. He does have a fairly significant  cellulitis with associated scrotal swelling. I have reassured the patient that the swelling will eventually resolved but may lag behind the resolution of his cellulitis. He appears to be responding to antibiotic therapy appropriately with no evidence of fever and a marked improvement in his white blood cell count.  Plan: 1. Continue current antibiotic regimen. 2. Recheck CBC in a.m. 3. I will reexamine him again in 24 hours.  Kaelan Amble C 07/11/2013, 7:08 AM

## 2013-07-11 NOTE — Progress Notes (Signed)
[facility] PROGRESS NOTE  Katheren ShamsCharles J Bose ZOX:[id number] DOB: 01/23/1956 DOA: 07/09/2013 PCP: [name],[name], MD  Brief narrative: 58 year old [facility] with scrotal edema. CT abd/pelvis revealed possible cellulitis and/or Fournier gangrene. Pt started on zosyn on admission. Normal scrotal ultrasound.   Assessment/Plan:   Principal Problem:  Cellulitis, scrotum  - agree with the management of cellulitis with zosyn and vancomycin - appreciate GU following   Code Status: full code  Family Communication: no family at the bedside  Disposition Plan: home when stable   Consultants:  Urology  Procedures:  None  Antibiotics:  Zosyn 07/10/2013 -->  vanco 07/10/2013 -->    Manson PasseyEVINE, Graeson Nouri, MD  [facility] Pager [phone]  If 7PM-7AM, please contact night-coverage [email or web address] Password [email or web address] 07/11/2013, 8:32 PM   LOS: 2 days    HPI/Subjective: No acute overnight events.   Objective: Filed Vitals:   07/10/13 1503 07/10/13 2200 07/11/13 0600 07/11/13 1412  BP: 123/73 123/64 148/76 100/71  Pulse: 97 87 74 96  Temp: 98.6 F (37 C) 99 F (37.2 C) 99.1 F (37.3 C) 98.8 F (37.1 C)  TempSrc: Oral Oral Oral Oral  Resp: 16 16 16 18   Height:      Weight:   192.552 kg (424 lb 8 oz)   SpO2: 94% 92% 92% 94%    Intake/Output Summary (Last 24 hours) at 07/11/13 2032 Last data filed at 07/11/13 1847  Gross per 24 hour  Intake 4110.33 ml  Output   1800 ml  Net 2310.33 ml    Exam:   General:  Pt is alert, follows commands appropriately, not in acute distress  Cardiovascular: Regular rate and rhythm, S1/S2 appreciated   Respiratory: Clear to auscultation bilaterally, no wheezing, no crackles, no rhonchi  Abdomen: Soft, non tender, non distended, bowel sounds present, no guarding  Extremities: chronic skin changes bilaterally, scrotal swelling and cellulitis  Neuro: Grossly nonfocal  Data Reviewed: Basic Metabolic  Panel:  Recent Labs Lab 07/10/13 0035 07/10/13 0057 07/10/13 1421  NA 132* 133* 132*  K 4.3 4.1 4.1  CL 95* 100 97  CO2 20  --  23  GLUCOSE 358* 363* 321*  BUN 24* 27* 24*  CREATININE 0.99 1.00 0.83  CALCIUM 8.5  --  8.4   Liver Function Tests:  Recent Labs Lab 07/10/13 0035  AST 13  ALT 12  ALKPHOS 103  BILITOT 1.3*  PROT 7.7  ALBUMIN 2.9*   No results found for this basename: LIPASE, AMYLASE,  in the last 168 hours No results found for this basename: AMMONIA,  in the last 168 hours CBC:  Recent Labs Lab 07/10/13 0035 07/10/13 0057 07/11/13 0525  WBC 19.4*  --  14.7*  HGB 15.8 17.0 13.6  HCT 45.5 50.0 40.3  MCV 85.4  --  86.3  PLT 177  --  160   Cardiac Enzymes: No results found for this basename: CKTOTAL, CKMB, CKMBINDEX, TROPONINI,  in the last 168 hours BNP: No components found with this basename: POCBNP,  CBG:  Recent Labs Lab 07/10/13 1555 07/10/13 2151 07/11/13 0742 07/11/13 1108  GLUCAP 308* 297* 288* 254*    No results found for this or any previous visit (from the past 240 hour(s)).   Studies: Ct Pelvis W Contrast  07/10/2013   CLINICAL DATA:  Testicular pain and discoloration  EXAM: CT PELVIS WITH CONTRAST  TECHNIQUE: Multidetector CT imaging of the pelvis was performed using the standard protocol following the bolus administration  of intravenous contrast.  CONTRAST:  OMNIPAQUE IOHEXOL 300 MG/ML  SOLN  COMPARISON:  07/10/2013 ultrasound  FINDINGS: Visualized intraperitoneal contents within normal limits.  Left greater than right pelvic sidewall adenopathy, measuring 2.7 cm on the left and 1.6 cm on the right. Prominent bilateral inguinal chain adenopathy, left greater than right.  Marked scrotal wall edema and subcutaneous fat stranding of the perineum. No subcutaneous gas. No significant inguinal hernia.  No acute osseous finding.  No appreciable vascular abnormality.  IMPRESSION: Scrotal wall thickening/ edema. May reflect cellulitis.  Fournier's gangrene not excluded.  Enlarged pelvic and inguinal lymph nodes, most pronounced along the left pelvic sidewall. Recommend follow up to document resolution/ exclude malignancy.   Electronically Signed   By: Jearld Lesch M.D.   On: 07/10/2013 03:01   US Scrotum  07/10/2013   CLINICAL DATA:  Testicular pain and swelling  EXAM: SCROTAL ULTRASOUND  DOPPLER ULTRASOUND OF THE TESTICLES  TECHNIQUE: Complete ultrasound examination of the testicles, epididymis, and other scrotal structures was performed. Color and spectral Doppler ultrasound were also utilized to evaluate blood flow to the testicles.  COMPARISON:  None.  FINDINGS: Right testicle  Measurements: 4.3 x 2.0 x 2.9 cm. No mass or microlithiasis visualized.  Left testicle  Measurements: 3.8 x 2.2 x 2.7 cm. No mass or microlithiasis visualized.  Right epididymis:  Normal in size and appearance.  Left epididymis:  Normal in size and appearance.  Hydrocele:  Trace on the left.  Varicocele:  None visualized.  Pulsed Doppler interrogation of both testes demonstrates low resistance arterial and venous waveforms bilaterally.  Other:  Diffuse scrotal wall edema.  IMPRESSION: Normal sonographic appearance to the testicles. Color Doppler flow with arterial and venous waveforms documented bilaterally.  Diffuse scrotal wall edema.   Electronically Signed   By: Jearld Lesch M.D.   On: 07/10/2013 01:07   [place] Art/ven Flow Abd Pelv Doppler  07/10/2013   CLINICAL DATA:  Testicular pain and swelling  EXAM: SCROTAL ULTRASOUND  DOPPLER ULTRASOUND OF THE TESTICLES  TECHNIQUE: Complete ultrasound examination of the testicles, epididymis, and other scrotal structures was performed. Color and spectral Doppler ultrasound were also utilized to evaluate blood flow to the testicles.  COMPARISON:  None.  FINDINGS: Right testicle  Measurements: 4.3 x 2.0 x 2.9 cm. No mass or microlithiasis visualized.  Left testicle  Measurements: 3.8 x 2.2 x 2.7 cm. No mass or  microlithiasis visualized.  Right epididymis:  Normal in size and appearance.  Left epididymis:  Normal in size and appearance.  Hydrocele:  Trace on the left.  Varicocele:  None visualized.  Pulsed Doppler interrogation of both testes demonstrates low resistance arterial and venous waveforms bilaterally.  Other:  Diffuse scrotal wall edema.  IMPRESSION: Normal sonographic appearance to the testicles. Color Doppler flow with arterial and venous waveforms documented bilaterally.  Diffuse scrotal wall edema.   Electronically Signed   By: Jearld Lesch M.D.   On: 07/10/2013 01:07    Scheduled Meds: . insulin aspart  0-20 Units Subcutaneous TID WC  . insulin aspart  0-5 Units Subcutaneous QHS  . piperacillin-tazobactam (ZOSYN)  IV  3.375 g Intravenous Q6H  . vancomycin  1,500 mg Intravenous Q12H   Continuous Infusions: . sodium chloride 100 mL/hr at 07/11/13 5597

## 2013-07-11 NOTE — Progress Notes (Signed)
Inpatient Diabetes Program Recommendations  AACE/ADA: New Consensus Statement on Inpatient Glycemic Control (2013)  Target Ranges:  Prepandial:   less than 140 mg/dL      Peak postprandial:   less than 180 mg/dL (1-2 hours)      Critically ill patients:  140 - 180 mg/dL   Reason for Visit: Newly-diagnosed DM  Diabetes history: None Outpatient Diabetes medications: None Current orders for Inpatient glycemic control: Novolog resistant tidwc and hs  Pt eating well. Discussed results of HgbA1C and what the goals are.  Discussed importance of diabetes education, learning insulin administration and monitoring blood sugars at home. Will need PCP to manage diabetes after discharge. Will also need prescription for glucose meter and supplies prior to discharge.  RN to begin teaching insulin administration today. Answered questions and pt appears to be motivated to make some lifestyle changes to assist with glycemic control.  Inpatient Diabetes Program Recommendations Insulin - Basal: Add Lantus 38 units QHS     (192 x .2units/kg) Insulin - Meal Coverage: Add meal coverage insulin - Novolog 6 units tidwc HgbA1C: 11.0% - uncontrolled Outpatient Referral: Will order OP Diabetes Education consult for newly-diagnosed DM  Note: Will continue to follow. Thank you. Ailene Ards, RD, LDN, CDE Inpatient Diabetes Coordinator [phone]

## 2013-07-11 NOTE — Consult Note (Signed)
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  Left LE with linear ulceration. He is self care at home; at one time and for two years he was seen by the outpatient wound care center and he stopped going because the wound was not improving, the copays were getting expensive and he decided he could care for his legs at home. Wound type:venous insufficiency vs lymphedema Pressure Ulcer POA: No Measurement:left lateral LE:  4cm x 1cm x 0.4cm Wound bed:red, moist.  75% red, 25% thin yellow slough Drainage (amount, consistency, odor) scant amount of exudate.  Wound is currently left open to air Periwound:intact with hemosiderin staining Dressing procedure/placement/frequency: I will implement a conservative POC as the wounds are not the reason for the admission and they are stable.  Xeroform gauze is the wound contact layer with light compression applied from toe to knee (metatarsal head to patellar notch). The right lateral LE has a recently healed ulceration that is completely reepithelialized. WOC nursing team will not follow, but will remain available to this patient, the nursing and medical team.  Please re-consult if needed. Thanks, Ladona Mow, MSN, RN, GNP, [place], CWON-AP [phone])

## 2013-07-11 NOTE — Progress Notes (Signed)
Pt given booklet on living with diabetes and diabetic home starter kit. He declined watching videos on diabetes. Also not interested in instructions on insulin administration at this time.States he is able to monitor his own CBG and self administer insulin if needed.Will continue to monitor.

## 2013-07-11 NOTE — Clinical Documentation Improvement (Signed)
Possible Clinical conditions  Morbid Obesity W/ BMI 59.38 Other condition___________________ Cannot clinically determine _____________  Risk Factors: Sign & Symptoms: Ht.:  6\' 1"    Wt.:  450 lbs  BMI:  59.38  Treatment Carb modified diet  Thank You, Harless Litten ,RN Clinical Documentation Specialist:  [phone]  [facility]- Health Information Management

## 2013-07-12 ENCOUNTER — Inpatient Hospital Stay (HOSPITAL_COMMUNITY): Payer: Medicare HMO

## 2013-07-12 DIAGNOSIS — IMO0002 Reserved for concepts with insufficient information to code with codable children: Secondary | ICD-10-CM | POA: Diagnosis present

## 2013-07-12 DIAGNOSIS — E1165 Type 2 diabetes mellitus with hyperglycemia: Secondary | ICD-10-CM | POA: Diagnosis present

## 2013-07-12 LAB — CBC
HCT: 40.8 % (ref 39.0–52.0)
Hemoglobin: 13.5 g/dL (ref 13.0–17.0)
MCH: 28.7 pg (ref 26.0–34.0)
MCHC: 33.1 g/dL (ref 30.0–36.0)
MCV: 86.6 fL (ref 78.0–100.0)
Platelets: 170 10*3/uL (ref 150–400)
RBC: 4.71 MIL/uL (ref 4.22–5.81)
RDW: 14.1 % (ref 11.5–15.5)
WBC: 13.4 10*3/uL — ABNORMAL HIGH (ref 4.0–10.5)

## 2013-07-12 LAB — BASIC METABOLIC PANEL
BUN: 15 mg/dL (ref 6–23)
CO2: 21 mEq/L (ref 19–32)
Calcium: 7.6 mg/dL — ABNORMAL LOW (ref 8.4–10.5)
Chloride: 97 mEq/L (ref 96–112)
Creatinine, Ser: 0.82 mg/dL (ref 0.50–1.35)
GFR calc Af Amer: >90 mL/min (ref >90)
GFR calc non Af Amer: >90 mL/min (ref >90)
Glucose, Bld: 374 mg/dL — ABNORMAL HIGH (ref 70–99)
Potassium: 3.6 mEq/L — ABNORMAL LOW (ref 3.7–5.3)
Sodium: 131 mEq/L — ABNORMAL LOW (ref 137–147)

## 2013-07-12 LAB — GLUCOSE, CAPILLARY
Glucose-Capillary: 252 mg/dL — ABNORMAL HIGH (ref 70–99)
Glucose-Capillary: 236 mg/dL — ABNORMAL HIGH (ref 70–99)
Glucose-Capillary: 269 mg/dL — ABNORMAL HIGH (ref 70–99)
Glucose-Capillary: 290 mg/dL — ABNORMAL HIGH (ref 70–99)

## 2013-07-12 LAB — VANCOMYCIN, TROUGH: Vancomycin Tr: 7.8 ug/mL — ABNORMAL LOW (ref 10.0–20.0)

## 2013-07-12 MED ORDER — ENOXAPARIN SODIUM 60 MG/0.6ML ~~LOC~~ SOLN
50.0000 mg | SUBCUTANEOUS | Status: DC
  Administered 2013-07-12 – 2013-07-17 (×6): 50 mg via SUBCUTANEOUS
  Filled 2013-07-12 (×7): qty 0.6

## 2013-07-12 MED ORDER — INSULIN ASPART 100 UNIT/ML ~~LOC~~ SOLN
6.0000 [IU] | Freq: Three times a day (TID) | SUBCUTANEOUS | Status: DC
  Administered 2013-07-12 – 2013-07-14 (×5): 6 [IU] via SUBCUTANEOUS

## 2013-07-12 MED ORDER — VANCOMYCIN HCL 10 G IV SOLR
1250.0000 mg | Freq: Three times a day (TID) | INTRAVENOUS | Status: DC
  Administered 2013-07-13 – 2013-07-14 (×4): 1250 mg via INTRAVENOUS
  Filled 2013-07-12 (×7): qty 1250

## 2013-07-12 MED ORDER — INSULIN GLARGINE 100 UNIT/ML ~~LOC~~ SOLN
35.0000 [IU] | Freq: Every day | SUBCUTANEOUS | Status: DC
  Administered 2013-07-12: 35 [IU] via SUBCUTANEOUS
  Filled 2013-07-12 (×2): qty 0.35

## 2013-07-12 MED ORDER — BD GETTING STARTED TAKE HOME KIT: 3/10ML X 30G SYRINGES
1.0000 | Freq: Once | Status: AC
  Administered 2013-07-12: 1
  Filled 2013-07-12: qty 1

## 2013-07-12 MED ORDER — INSULIN PEN STARTER KIT: 1.0000 | Freq: Once | Status: DC

## 2013-07-12 NOTE — Progress Notes (Addendum)
Diabetic kit given to pt. Encourage to watch Videos on Diabetes. Scrotum very edematous and reddened, pt refused to elevate scrotum.

## 2013-07-12 NOTE — Consult Note (Signed)
   Subjective: The patient reports continued scrotal swelling but no new pain or systemic symptoms such as diaphoresis or fevers.  Objective: Vital signs in last 24 hours: Temp:  [98.7 F (37.1 C)-99.8 F (37.7 C)] 98.7 F (37.1 C) (01/28 0700) Pulse Rate:  [91-100] 100 (01/28 0700) Resp:  [18-20] 18 (01/28 0700) BP: (100-130)/(62-77) 130/77 mmHg (01/28 0700) SpO2:  [92 %-94 %] 92 % (01/28 0700) Weight:  [192.325 kg (424 lb)] 192.325 kg (424 lb) (01/28 0700)A  Intake/Output from previous day: 01/27 0701 - 01/28 0700 In: 1732 [P.O.:920; I.V.:812] Out: 1200 [Urine:1200] Intake/Output this shift:    History reviewed. No pertinent past medical history.  Physical Exam:  Lungs - Normal respiratory effort, chest expands symmetrically.  Abdomen - Soft, non-tender & non-distended. Scrotum - the scrotum is still erythematous and edematous.  There is no crepitus and I am not able to appreciate any fluctuance.  It is slightly tender to palpation.There is induration involving the left inguinal region as well.  Lab Results:  Recent Labs  07/10/13 0035 07/10/13 0057 07/11/13 0525 07/12/13 0530  WBC 19.4*  --  14.7* 13.4*  HGB 15.8 17.0 13.6 13.5  HCT 45.5 50.0 40.3 40.8   BMET  Recent Labs  07/10/13 0035 07/10/13 0057 07/10/13 1421  NA 132* 133* 132*  K 4.3 4.1 4.1  CL 95* 100 97  CO2 20  --  23  GLUCOSE 358* 363* 321*  BUN 24* 27* 24*  CREATININE 0.99 1.00 0.83  CALCIUM 8.5  --  8.4   No results found for this basename: LABURIN,  in the last 72 hours No results found for this or any previous visit.  Studies/Results: @RISRSLT24 @  Assessment: He continues to have scrotal erythema and edema but has no evidence whatsoever of Fournier's gangrene or crepitus.  I am not able to appreciate any fluctuance although he is still slightly tender.  His white blood cell count has continued to fall on antibiotic therapy however I'm concerned that the erythema has not diminished  significantly nor has the swelling.  I discussed with the patient repeating his scrotal ultrasound today to be sure there was no evidence for an infected hydrocele or other cause of his persistent swelling and erythema.  Plan:1.  Continue antibiotic therapy. 2.  I have ordered a repeat scrotal ultrasound for today.  Virna Livengood C 07/12/2013, 8:24 AM

## 2013-07-12 NOTE — Progress Notes (Signed)
Inpatient Diabetes Program Recommendations  AACE/ADA: New Consensus Statement on Inpatient Glycemic Control (2013)  Target Ranges:  Prepandial:   less than 140 mg/dL      Peak postprandial:   less than 180 mg/dL (1-2 hours)      Critically ill patients:  140 - 180 mg/dL   Reason for Visit: Hyperglycemia  Results for [name], [name] (MRN [id number]) as of 07/12/2013 12:03  Ref. Range 07/10/2013 15:55 07/10/2013 21:51 07/11/2013 07:42 07/11/2013 11:08 07/11/2013 16:39 07/11/2013 22:15 07/12/2013 07:54 07/12/2013 11:16  Glucose-Capillary Latest Range: 70-99 mg/dL 361 (H) 443 (H) 154 (H) 254 (H) 211 (H) 271 (H) 252 (H) 236 (H)   Hyperglycemia x 3 days - Needs basal insulin added. Would also benefit from addition of Novolog meal coverage insulin  Inpatient Diabetes Program Recommendations Insulin - Basal: Add Lantus 38 units QHS     (192 x .2units/kg) Insulin - Meal Coverage: Add meal coverage insulin - Novolog 6 units tidwc HgbA1C: 11.0% - uncontrolled Outpatient Referral: Will order OP Diabetes Education consult for newly-diagnosed DM  Note: Pt declined to watch diabetes videos and seems uninterested in learning how to inject insulin per RN notes. Will continue to encourage pt and discuss importance of glucose control.  Thank you. Ailene Ards, RD, LDN, CDE Inpatient Diabetes Coordinator [phone]

## 2013-07-12 NOTE — Progress Notes (Addendum)
ANTIBIOTIC CONSULT NOTE - Follow up  Pharmacy Consult for Zosyn, Vancomycin Indication: scrotal cellulitis  No Known Allergies  Patient Measurements: Height: 6\' 1"  (185.4 cm) Weight: 424 lb (192.325 kg) IBW/kg (Calculated) : 79.9   Vital Signs: Temp: 98.9 F (37.2 C) (01/28 0857) Temp src: Oral (01/28 0857) BP: 128/89 mmHg (01/28 0857) Pulse Rate: 86 (01/28 0857) Intake/Output from previous day: 01/27 0701 - 01/28 0700 In: 1732 [P.O.:920; I.V.:812] Out: 1200 [Urine:1200] Intake/Output from this shift: Total I/O In: 240 [P.O.:240] Out: -   Labs:  Recent Labs  07/10/13 0035 07/10/13 0057 07/10/13 1421 07/11/13 0525 07/12/13 0530  WBC 19.4*  --   --  14.7* 13.4*  HGB 15.8 17.0  --  13.6 13.5  PLT 177  --   --  160 170  CREATININE 0.99 1.00 0.83  --   --    Estimated Creatinine Clearance: 175.6 ml/min (by C-G formula based on Cr of 0.83). No results found for this basename: VANCOTROUGH, VANCOPEAK, VANCORANDOM, GENTTROUGH, GENTPEAK, GENTRANDOM, TOBRATROUGH, TOBRAPEAK, TOBRARND, AMIKACINPEAK, AMIKACINTROU, AMIKACIN,  in the last 72 hours   Microbiology: No results found for this or any previous visit (from the past 720 hour(s)).  Medical History: Morbid obesity   Medications:  Scheduled:  . insulin aspart  0-20 Units Subcutaneous TID WC  . insulin aspart  0-5 Units Subcutaneous QHS  . piperacillin-tazobactam (ZOSYN)  IV  3.375 g Intravenous Q6H  . vancomycin  1,500 mg Intravenous Q12H   Infusions:  . sodium chloride 100 mL/hr at 07/12/13 0846   PRN: HYDROmorphone (DILAUDID) injection, ondansetron (ZOFRAN) IV, ondansetron, oxyCODONE, polyethylene glycol, zolpidem  Assessment: 58 y/o M presented to ED with scrotal cellulitis. On 1/26 received Zosyn 3.375g IV x1 (0113) and Vanc 2g IV x 1 (0200) with orders to continue per pharmacy dosing. Risk factors for Fournier's gangrene as outlined by MD are noted (morbid obesity, hyperglycemia with likely underlying  DM).  1/26 >> Zosyn >> 1/26 >> Vanco >>  Tmax: AF WBCs: elev but improving Renal: SCr 0.83 on 1/26 (improved), CrCl 100 N  No cultures  Due to ongoing erythema and swelling, repeating ultrasound and continuing empiric abx for now.   Goal of Therapy:   Proper antibiotic dosing for weight and renal function; eradication of infection  Vancomycin trough 15-20  Plan:   Continue Zosyn 3.375g IV q6h by extended infusion, each dose over 4 hours.  (Empirically increased dosage from the standard q8h extended infusion regimen due to morbid obesity).  Continue Vancomycin 1500mg  IV q12h.   Check VT and SCr at 13:30 today  Follow serum creatinine, any culture data, and clinical course.  Charolotte Eke, PharmD, pager [phone]. 07/12/2013,9:53 AM.  Addendum:  Vanc trough 7.8mg /l, below goal on 1500mg  IV q12h. SCr remains wnl. Change Vanc to 1250mg  IV q8h, start tonight. Recheck Vanc trough as steady-state in ~24hrs.  Charolotte Eke, PharmD, pager [phone]. 07/12/2013,3:07 PM.

## 2013-07-12 NOTE — Progress Notes (Signed)
[facility] PROGRESS NOTE  Matthew ShamsCharles [name] General ZOX:[id number] DOB: 01/15/1956 DOA: 07/09/2013 PCP: [name]  Brief narrative: 58 year old male [facility] with scrotal edema. CT abd/pelvis revealed possible cellulitis and/or Fournier gangrene. Pt started on zosyn on admission. Normal scrotal ultrasound.   Assessment/Plan:   Principal Problem:  Cellulitis, scrotum  - On IV zosyn and vancomycin day 3 - per Dr.Ottelin -scrotal USG today -pain control  Leg wound -wound care  Uncontrolled DM -new onset -hbaic 11 -DM education -start lantus and meal coverage  DVT proph: start lovenox  Code Status: full code  Family Communication: no family at the bedside  Disposition Plan: home when stable   Consultants:  Urology  Procedures:  None  Antibiotics:  Zosyn 07/10/2013 -->  vanco 07/10/2013 -->    [name]  [facility] Pager [phone]  If 7PM-7AM, please contact night-coverage [email or web address] Password [email or web address] 07/12/2013, 12:56 PM   LOS: 3 days    HPI/Subjective: No acute overnight events, still complains of scrotal swelling and pain   Objective: Filed Vitals:   07/11/13 1412 07/11/13 2200 07/12/13 0700 07/12/13 0857  BP: 100/71 108/62 130/77 128/89  Pulse: 96 91 100 86  Temp: 98.8 F (37.1 C) 99.8 F (37.7 C) 98.7 F (37.1 C) 98.9 F (37.2 C)  TempSrc: Oral Oral Oral Oral  Resp: 18 20 18 20   Height:      Weight:   192.325 kg (424 lb)   SpO2: 94% 94% 92% 93%    Intake/Output Summary (Last 24 hours) at 07/12/13 1256 Last data filed at 07/12/13 0840  Gross per 24 hour  Intake   1612 ml  Output   1200 ml  Net    412 ml    Exam:   General:  Pt is alert, follows commands appropriately, not in acute distress  Cardiovascular: Regular rate and rhythm, S1/S2 appreciated   Respiratory: Clear to auscultation bilaterally, no wheezing, no crackles, no rhonchi  Abdomen: Soft, non tender, non distended, bowel  sounds present, no guarding  Extremities: chronic skin changes bilaterally, L lower leg with ulcer  GU: scrotal swelling and cellulitis  Neuro: Grossly nonfocal  Data Reviewed: Basic Metabolic Panel:  Recent Labs Lab 07/10/13 0035 07/10/13 0057 07/10/13 1421  NA 132* 133* 132*  K 4.3 4.1 4.1  CL 95* 100 97  CO2 20  --  23  GLUCOSE 358* 363* 321*  BUN 24* 27* 24*  CREATININE 0.99 1.00 0.83  CALCIUM 8.5  --  8.4   Liver Function Tests:  Recent Labs Lab 07/10/13 0035  AST 13  ALT 12  ALKPHOS 103  BILITOT 1.3*  PROT 7.7  ALBUMIN 2.9*   No results found for this basename: LIPASE, AMYLASE,  in the last 168 hours No results found for this basename: AMMONIA,  in the last 168 hours CBC:  Recent Labs Lab 07/10/13 0035 07/10/13 0057 07/11/13 0525 07/12/13 0530  WBC 19.4*  --  14.7* 13.4*  HGB 15.8 17.0 13.6 13.5  HCT 45.5 50.0 40.3 40.8  MCV 85.4  --  86.3 86.6  PLT 177  --  160 170   Cardiac Enzymes: No results found for this basename: CKTOTAL, CKMB, CKMBINDEX, TROPONINI,  in the last 168 hours BNP: No components found with this basename: POCBNP,  CBG:  Recent Labs Lab 07/11/13 1108 07/11/13 1639 07/11/13 2215 07/12/13 0754 07/12/13 1116  GLUCAP 254* 211* 271* 252* 236*    No results found for this or  any previous visit (from the past 240 hour(s)).   Studies: No results found.  Scheduled Meds: . insulin aspart  0-20 Units Subcutaneous TID WC  . insulin aspart  0-5 Units Subcutaneous QHS  . insulin aspart  6 Units Subcutaneous TID WC  . insulin glargine  35 Units Subcutaneous QHS  . piperacillin-tazobactam (ZOSYN)  IV  3.375 g Intravenous Q6H  . vancomycin  1,500 mg Intravenous Q12H   Continuous Infusions: . sodium chloride 100 mL/hr at 07/12/13 [phone]

## 2013-07-13 LAB — GLUCOSE, CAPILLARY
Glucose-Capillary: 210 mg/dL — ABNORMAL HIGH (ref 70–99)
Glucose-Capillary: 218 mg/dL — ABNORMAL HIGH (ref 70–99)
Glucose-Capillary: 220 mg/dL — ABNORMAL HIGH (ref 70–99)
Glucose-Capillary: 210 mg/dL — ABNORMAL HIGH (ref 70–99)

## 2013-07-13 LAB — BASIC METABOLIC PANEL
BUN: 13 mg/dL (ref 6–23)
CO2: 22 mEq/L (ref 19–32)
Calcium: 7.6 mg/dL — ABNORMAL LOW (ref 8.4–10.5)
Chloride: 98 mEq/L (ref 96–112)
Creatinine, Ser: 0.79 mg/dL (ref 0.50–1.35)
GFR calc Af Amer: >90 mL/min (ref >90)
GFR calc non Af Amer: >90 mL/min (ref >90)
Glucose, Bld: 252 mg/dL — ABNORMAL HIGH (ref 70–99)
Potassium: 3.3 mEq/L — ABNORMAL LOW (ref 3.7–5.3)
Sodium: 133 mEq/L — ABNORMAL LOW (ref 137–147)

## 2013-07-13 LAB — CBC
HCT: 39.7 % (ref 39.0–52.0)
Hemoglobin: 13.2 g/dL (ref 13.0–17.0)
MCH: 28.8 pg (ref 26.0–34.0)
MCHC: 33.2 g/dL (ref 30.0–36.0)
MCV: 86.5 fL (ref 78.0–100.0)
Platelets: 193 10*3/uL (ref 150–400)
RBC: 4.59 MIL/uL (ref 4.22–5.81)
RDW: 14.3 % (ref 11.5–15.5)
WBC: 11.5 10*3/uL — ABNORMAL HIGH (ref 4.0–10.5)

## 2013-07-13 MED ORDER — INSULIN GLARGINE 100 UNIT/ML ~~LOC~~ SOLN
45.0000 [IU] | Freq: Every day | SUBCUTANEOUS | Status: DC
  Administered 2013-07-13: 45 [IU] via SUBCUTANEOUS
  Filled 2013-07-13: qty 0.45

## 2013-07-13 NOTE — Progress Notes (Signed)
Patient ID: [name], male   DOB: 01-09-1956, 58 y.o.   MRN: [id number]  Subjective: [name] reports feeling better but indicates there is some drainage from the scrotum. He is otherwise without complaint.  Objective: Vital signs in last 24 hours: Temp:  [98.6 F (37 C)-100.7 F (38.2 C)] 98.6 F (37 C) (01/29 0500) Pulse Rate:  [86-102] 89 (01/29 0500) Resp:  [20] 20 (01/29 0500) BP: (111-131)/(70-89) 131/70 mmHg (01/29 0500) SpO2:  [93 %-95 %] 95 % (01/29 0500)A  Intake/Output from previous day: 01/28 0701 - 01/29 0700 In: 4002.3 [P.O.:1414; I.V.:2538.3; IV Piggyback:50] Out: 1340 [Urine:1340] Intake/Output this shift:    History reviewed. No pertinent past medical history.  Physical Exam:  Lungs - Normal respiratory effort, chest expands symmetrically.  Abdomen - Soft, obese, non-tender & non-distended. GU - the scrotum is erythematous. There is no crepitus. There is an area on the anterior aspect of the left hemiscrotum it appears to have opened and is draining serous fluid. It appears to be associated with an area of ecchymoses approximately 2 cm in size. This area was palpated extensively and I cannot appreciate fluctuance or express purulence.  Lab Results:  Recent Labs  07/11/13 0525 07/12/13 0530 07/13/13 0449  WBC 14.7* 13.4* 11.5*  HGB 13.6 13.5 13.2  HCT 40.3 40.8 39.7   BMET  Recent Labs  07/12/13 1415 07/13/13 0449  NA 131* 133*  K 3.6* 3.3*  CL 97 98  CO2 21 22  GLUCOSE 374* 252*  BUN 15 13  CREATININE 0.82 0.79  CALCIUM 7.6* 7.6*   I independently reviewed his scrotal ultrasound images.  Studies/Results: @RISRSLT24 @  Assessment: Scrotal cellulitis - clinically he continues to feel better and is not having any fever. His white blood cell count continues to fall and is nearly normal now. His scrotal ultrasound reveals no areas of abscess. There is thickening of the scrotal skin. There is a mild reactive hydrocele on the left-hand  side. He has developed some breakdown of the skin anteriorly on the scrotum. This currently does not appear to be associated with an abscess. I was unable to appreciate any fluctuance or express any purulence from the area and no abscess was identified on his ultrasound therefore there does not appear to be any surgical intervention that could be undertaken to hasten his recovery/improvement. I do not feel the small left hydrocele is of clinical significance as he does have some induration involving the cord region and likely decreased lymphatic drainage resulting in the formation of the hydrocele as well as the contribution from the overlying edematous skin. A varicocele was noted on his scrotal ultrasound however this is of no clinical significance.  Plan: 1. Continue intravenous antibiotics. 2. Local wound care to anterior scrotal skin.  3. He continues to have no evidence of surgically treatable disease.   [name] C 07/13/2013, 7:07 AM

## 2013-07-13 NOTE — Progress Notes (Signed)
Inpatient Diabetes Program Recommendations  AACE/ADA: New Consensus Statement on Inpatient Glycemic Control (2013)  Target Ranges:  Prepandial:   less than 140 mg/dL      Peak postprandial:   less than 180 mg/dL (1-2 hours)      Critically ill patients:  140 - 180 mg/dL   Reason for Visit: Hyperglycemia  Inpatient Diabetes Program Recommendations Insulin - Basal: Increase Lantus to 50 units QHS Insulin - Meal Coverage: Increase Novolog to 10 units tidwc  HgbA1C: 11.0% - uncontrolled Outpatient Referral: Will order OP Diabetes Education consult for newly-diagnosed DM  Note: Continue with inpatient education. Pt should be drawing up insulin and injecting. Also, allow pt to test CBGs. Will follow. Thank you. Ailene Ards, RD, LDN, CDE Inpatient Diabetes Coordinator [phone]

## 2013-07-13 NOTE — Progress Notes (Signed)
[facility] PROGRESS NOTE  [name] [id number] DOB: 04/14/1956 DOA: 07/09/2013 PCP: [name],[name], MD  Brief narrative: 58 year old [facility] with scrotal edema. CT abd/pelvis revealed possible cellulitis and/or Fournier gangrene. Pt started on zosyn on admission. Normal scrotal ultrasound.   Assessment/Plan:   Principal Problem:  Cellulitis, scrotum  -now with skin breakdown and discharge -On IV zosyn and vancomycin day 4 - per Dr.Ottelin -scrotal USG 1/28 noted -pain control  Leg wound -wound care  Uncontrolled DM -new onset -hbaic 11 -DM education -increase lantus to 45 units, continue meal coverage and SSI  DVT proph: start lovenox  Code Status: full code  Family Communication: no family at the bedside  Disposition Plan: home when stable   Consultants:  Urology  Procedures:  None  Antibiotics:  Zosyn 07/10/2013 -->  vanco 07/10/2013 -->    Zannie CoveJOSEPH,Zykeria Laguardia, MD  [facility] Pager [phone]  If 7PM-7AM, please contact night-coverage [email or web address] Password [email or web address] 07/13/2013, 11:39 AM   LOS: 4 days    HPI/Subjective: No acute overnight events, complains of scrotal swelling and discharge  Objective: Filed Vitals:   07/12/13 0700 07/12/13 0857 07/12/13 2051 07/13/13 0500  BP: 130/77 128/89 111/71 131/70  Pulse: 100 86 102 89  Temp: 98.7 F (37.1 C) 98.9 F (37.2 C) 100.7 F (38.2 C) 98.6 F (37 C)  TempSrc: Oral Oral Oral Oral  Resp: 18 20 20 20   Height:      Weight: 192.325 kg (424 lb)     SpO2: 92% 93% 94% 95%    Intake/Output Summary (Last 24 hours) at 07/13/13 1139 Last data filed at 07/13/13 1030  Gross per 24 hour  Intake 3765.33 ml  Output   1190 ml  Net 2575.33 ml    Exam:   General:  Pt is alert, follows commands appropriately, not in acute distress  Cardiovascular: Regular rate and rhythm, S1/S2 appreciated   Respiratory: Clear to auscultation bilaterally, no wheezing, no  crackles, no rhonchi  Abdomen: Soft, non tender, non distended, bowel sounds present, no guarding  Extremities: chronic skin changes bilaterally, L lower leg with ulcer  GU: scrotal swelling and cellulitis  Neuro: Grossly nonfocal  Data Reviewed: Basic Metabolic Panel:  Recent Labs Lab 07/10/13 0035 07/10/13 0057 07/10/13 1421 07/12/13 1415 07/13/13 0449  NA 132* 133* 132* 131* 133*  K 4.3 4.1 4.1 3.6* 3.3*  CL 95* 100 97 97 98  CO2 20  --  23 21 22   GLUCOSE 358* 363* 321* 374* 252*  BUN 24* 27* 24* 15 13  CREATININE 0.99 1.00 0.83 0.82 0.79  CALCIUM 8.5  --  8.4 7.6* 7.6*   Liver Function Tests:  Recent Labs Lab 07/10/13 0035  AST 13  ALT 12  ALKPHOS 103  BILITOT 1.3*  PROT 7.7  ALBUMIN 2.9*   No results found for this basename: LIPASE, AMYLASE,  in the last 168 hours No results found for this basename: AMMONIA,  in the last 168 hours CBC:  Recent Labs Lab 07/10/13 0035 07/10/13 0057 07/11/13 0525 07/12/13 0530 07/13/13 0449  WBC 19.4*  --  14.7* 13.4* 11.5*  HGB 15.8 17.0 13.6 13.5 13.2  HCT 45.5 50.0 40.3 40.8 39.7  MCV 85.4  --  86.3 86.6 86.5  PLT 177  --  160 170 193   Cardiac Enzymes: No results found for this basename: CKTOTAL, CKMB, CKMBINDEX, TROPONINI,  in the last 168 hours BNP: No components found with this basename: POCBNP,  CBG:  Recent Labs Lab 07/12/13 0754 07/12/13 1116 07/12/13 1723 07/12/13 2047 07/13/13 0736  GLUCAP 252* 236* 269* 290* 210*    No results found for this or any previous visit (from the past 240 hour(s)).   Studies: US Scrotum  07/12/2013   CLINICAL DATA:  Scrotal swelling.  EXAM: ULTRASOUND OF SCROTUM  TECHNIQUE: Complete ultrasound examination of the testicles, epididymis, and other scrotal structures was performed.  COMPARISON:  None.  FINDINGS: Right testicle  Measurements: 4.2 x 2.4 x 2.4 cm. No mass or microlithiasis visualized.  Left testicle  Measurements: 3.6 x 2.4 x 2.7 cm. No mass or  microlithiasis visualized.  Right epididymis:  Normal in size and appearance.  Left epididymis:  Normal in size and appearance.  Hydrocele: Small right hydrocele is identified. There is a larger partially loculated left hydrocele.  Varicocele:  Large right varicocele.  Other.  There is diffuse edema involving the scrotum.  IMPRESSION: 1. Scrotal edema. 2. Bilateral hydroceles, left greater than right. The left hydrocele appears partially loculated. 3. Right varicocele. 4. No evidence for testicular mass or torsion.   Electronically Signed   By: Signa Kell M.D.   On: 07/12/2013 13:20    Scheduled Meds: . enoxaparin (LOVENOX) injection  50 mg Subcutaneous Q24H  . insulin aspart  0-20 Units Subcutaneous TID WC  . insulin aspart  0-5 Units Subcutaneous QHS  . insulin aspart  6 Units Subcutaneous TID WC  . insulin glargine  45 Units Subcutaneous QHS  . piperacillin-tazobactam (ZOSYN)  IV  3.375 g Intravenous Q6H  . vancomycin  1,250 mg Intravenous Q8H   Continuous Infusions: . sodium chloride 100 mL/hr at 07/12/13 [id number]

## 2013-07-14 DIAGNOSIS — N492 Inflammatory disorders of scrotum: Secondary | ICD-10-CM | POA: Diagnosis not present

## 2013-07-14 LAB — CBC
HCT: 40 % (ref 39.0–52.0)
Hemoglobin: 13.5 g/dL (ref 13.0–17.0)
MCH: 29.1 pg (ref 26.0–34.0)
MCHC: 33.8 g/dL (ref 30.0–36.0)
MCV: 86.2 fL (ref 78.0–100.0)
Platelets: 224 10*3/uL (ref 150–400)
RBC: 4.64 MIL/uL (ref 4.22–5.81)
RDW: 14.5 % (ref 11.5–15.5)
WBC: 9.1 10*3/uL (ref 4.0–10.5)

## 2013-07-14 LAB — BASIC METABOLIC PANEL
BUN: 10 mg/dL (ref 6–23)
CO2: 23 mEq/L (ref 19–32)
Calcium: 7.7 mg/dL — ABNORMAL LOW (ref 8.4–10.5)
Chloride: 100 mEq/L (ref 96–112)
Creatinine, Ser: 0.69 mg/dL (ref 0.50–1.35)
GFR calc Af Amer: >90 mL/min (ref >90)
GFR calc non Af Amer: >90 mL/min (ref >90)
Glucose, Bld: 184 mg/dL — ABNORMAL HIGH (ref 70–99)
Potassium: 3.4 mEq/L — ABNORMAL LOW (ref 3.7–5.3)
Sodium: 136 mEq/L — ABNORMAL LOW (ref 137–147)

## 2013-07-14 LAB — GLUCOSE, CAPILLARY
Glucose-Capillary: 158 mg/dL — ABNORMAL HIGH (ref 70–99)
Glucose-Capillary: 181 mg/dL — ABNORMAL HIGH (ref 70–99)
Glucose-Capillary: 165 mg/dL — ABNORMAL HIGH (ref 70–99)
Glucose-Capillary: 123 mg/dL — ABNORMAL HIGH (ref 70–99)

## 2013-07-14 LAB — VANCOMYCIN, TROUGH: Vancomycin Tr: 19.1 ug/mL (ref 10.0–20.0)

## 2013-07-14 MED ORDER — INSULIN ASPART 100 UNIT/ML ~~LOC~~ SOLN
8.0000 [IU] | Freq: Three times a day (TID) | SUBCUTANEOUS | Status: DC
  Administered 2013-07-14 – 2013-07-17 (×10): 8 [IU] via SUBCUTANEOUS

## 2013-07-14 MED ORDER — VANCOMYCIN HCL 10 G IV SOLR
1500.0000 mg | Freq: Two times a day (BID) | INTRAVENOUS | Status: DC
  Administered 2013-07-14 – 2013-07-18 (×9): 1500 mg via INTRAVENOUS
  Filled 2013-07-14 (×10): qty 1500

## 2013-07-14 MED ORDER — INSULIN GLARGINE 100 UNIT/ML ~~LOC~~ SOLN
50.0000 [IU] | Freq: Every day | SUBCUTANEOUS | Status: DC
  Administered 2013-07-14 – 2013-07-17 (×4): 50 [IU] via SUBCUTANEOUS
  Filled 2013-07-14 (×5): qty 0.5

## 2013-07-14 NOTE — Progress Notes (Signed)
Patient ID: [name], male   DOB: 09/13/1955, 58 y.o.   MRN: [id number]  Subjective: The patient reports having experienced more drainage from the scrotum. He is not having any significant pain. The swelling of the scrotum has not diminished.  Objective: Vital signs in last 24 hours: Temp:  [98.2 F (36.8 C)-99.1 F (37.3 C)] 98.2 F (36.8 C) (01/30 0508) Pulse Rate:  [83-90] 86 (01/30 0508) Resp:  [20] 20 (01/30 0508) BP: (115-153)/(63-78) 123/63 mmHg (01/30 0508) SpO2:  [96 %] 96 % (01/30 0508) Weight:  [189.6 kg (417 lb 15.9 oz)-201.3 kg (443 lb 12.6 oz)] 189.6 kg (417 lb 15.9 oz) (01/30 0508)A  Intake/Output from previous day: 01/29 0701 - 01/30 0700 In: 1080 [P.O.:480; I.V.:600] Out: 950 [Urine:950] Intake/Output this shift:    History reviewed. No pertinent past medical history.  Physical Exam:  Lungs - Normal respiratory effort, chest expands symmetrically.  Abdomen - Soft, obese, non-tender & non-distended. Scrotum - It remains swollen and erythematous. I noted an area that appeared to be ecchymotic yesterday he now has turned necrotic. He's draining clear amber serous fluid however palpation revealed fluctuance and I was able to express purulent material. The scrotum was lifted and was fully evaluated with no other areas of fluctuance or concern. I was able to evaluate the undersurface of the scrotum and the perineum all the way down to the rectal region. No further areas of concern were noted.  Lab Results:  Recent Labs  07/12/13 0530 07/13/13 0449 07/14/13 0515  WBC 13.4* 11.5* 9.1  HGB 13.5 13.2 13.5  HCT 40.8 39.7 40.0   BMET  Recent Labs  07/13/13 0449 07/14/13 0515  NA 133* 136*  K 3.3* 3.4*  CL 98 100  CO2 22 23  GLUCOSE 252* 184*  BUN 13 10  CREATININE 0.79 0.69  CALCIUM 7.6* 7.7*   No results found for this basename: LABURIN,  in the last 72 hours No results found for this or any previous  visit.  Studies/Results: @RISRSLT24 @  Assessment: The scrotum has declared itself with the formation of an abscess and this is clearly why he was not improving as anticipated. His white blood cell count has continued to improve and is now within the normal range and he is afebrile indicating that the antibiotics are obviously helping however he needs abscess drainage and I discussed that with him today. It appears to be limited and not associated with any signs of Fournier's gangrene and therefore I do not believe this needs to be managed in the operating room and could be explored initially at the bedside with the possible need for surgical exploration depending on my findings. I went over the procedure of abscess drainage with him including its risks and complications and the anticipated probability of success as well as postoperative course. He understands and elected to proceed.  Procedure: At the bedside with the patient in the supine position I sterilely prepped the scrotum with Betadine and draped it with a sterile drape. I then injected 2% plain lidocaine into the scrotal skin over the area of fluctuance and made an incision approximately 2 cm in length. I then obtained a specimen for culture. The wound was then digitally explored and was found to be limited to the anterior scrotum. I broke up some loculations digitally and then irrigated the wound copiously with normal saline. I then packed the wound with half-inch plain gauze, applied a 4 x 4 and secured this with a Curlex. The patient  tolerated the procedure well.  Plan: 1. Continue antibiotics. 2. Wound care team will be consulted. 3. Due to the patient's body habitus he will not be able to change his dressing at home and therefore home health will need to be involved. 4. There was still some induration extending into the inguinal region which I can't explain but will need to be monitored as well. 5. A culture of the wound was obtained  today.  [name] C 07/14/2013, 10:17 AM

## 2013-07-14 NOTE — Progress Notes (Signed)
ANTIBIOTIC CONSULT NOTE - Follow up  Pharmacy Consult for Zosyn, Vancomycin Indication: scrotal cellulitis  No Known Allergies  Patient Measurements: Height: 6\' 1"  (185.4 cm) Weight: 417 lb 15.9 oz (189.6 kg) IBW/kg (Calculated) : 79.9   Vital Signs: Temp: 98.2 F (36.8 C) (01/30 0508) Temp src: Oral (01/30 0508) BP: 123/63 mmHg (01/30 0508) Pulse Rate: 86 (01/30 0508) Intake/Output from previous day: 01/29 0701 - 01/30 0700 In: 1080 [P.O.:480; I.V.:600] Out: 950 [Urine:950] Intake/Output from this shift:    Labs:  Recent Labs  07/12/13 0530 07/12/13 1415 07/13/13 0449 07/14/13 0515  WBC 13.4*  --  11.5* 9.1  HGB 13.5  --  13.2 13.5  PLT 170  --  193 224  CREATININE  --  0.82 0.79 0.69   Estimated Creatinine Clearance: 180.5 ml/min (by C-G formula based on Cr of 0.69).  Recent Labs  07/12/13 1415 07/14/13 0515  VANCOTROUGH 7.8* 19.1     Microbiology: No results found for this or any previous visit (from the past 720 hour(s)).  Medical History: Morbid obesity   Medications:  Scheduled:  . enoxaparin (LOVENOX) injection  50 mg Subcutaneous Q24H  . insulin aspart  0-20 Units Subcutaneous TID WC  . insulin aspart  0-5 Units Subcutaneous QHS  . insulin aspart  6 Units Subcutaneous TID WC  . insulin glargine  45 Units Subcutaneous QHS  . piperacillin-tazobactam (ZOSYN)  IV  3.375 g Intravenous Q6H  . vancomycin  1,250 mg Intravenous Q8H   Infusions:  . sodium chloride 50 mL/hr at 07/13/13 1234   PRN: HYDROmorphone (DILAUDID) injection, ondansetron (ZOFRAN) IV, ondansetron, oxyCODONE, polyethylene glycol, zolpidem  Assessment: 58 y/o M presented to ED with scrotal cellulitis. Risk factors for Fournier's gangrene as outlined by MD were noted (morbid obesity, hyperglycemia with likely underlying DM).  Orders were received to begin empiric Zosyn and vancomycin with pharmacy dosing.  1/26 >> Zosyn >> 1/26 >> Vanco >>  1/30: Tmax: AF WBCs: improved,  WNL Renal: SCr 0.69, CrCl > 100 mL/min/72kg  (weight-normalized)  No culture data Per urologist examination, no evidence of abscess or other surgically treatable disease.  Vancomycin trough therapeutic (19.1) but at upper end of goal range.   Concerned that further vancomycin accumulation will continue on q8h regimen and could increase the risk of nephrotoxicity.  Goal of Therapy:   Proper antibiotic dosing for weight and renal function; eradication of infection  Vancomycin trough 15-20  Plan:   Change vancomycin dosage to 1500 mg IV q12h  Continue Zosyn 3.375g IV q6h by extended infusion, each dose over 4 hours.  (Empirically using a dosage higher than usual q8h extended infusion regimen due to morbid obesity).  Follow serum creatinine and clinical course.  Elie Goody, PharmD, BCPS Pager: [phone] 07/14/2013  7:45 AM

## 2013-07-14 NOTE — Care Management Note (Unsigned)
    Page 1 of 1   07/14/2013     3:43:30 PM   CARE MANAGEMENT NOTE 07/14/2013  Patient:  [name], [name]   Account Number:  [id number]  Date Initiated:  07/14/2013  Documentation initiated by:  [facility]  Subjective/Objective Assessment:   58 year old male admitted with scrotal abscess.     Action/Plan:   From home, needs [facility] services for wound care.   Anticipated DC Date:  07/17/2013   Anticipated DC Plan:  [place]  CM consult      Choice offered to / List presented to:  C-1 Patient        [place] arranged  HH-1 RN      [facility].   Status of service:  In process, will continue to follow Medicare Important Message given?  NA - LOS <3 / Initial given by admissions (If response is "NO", the following Medicare IM given date fields will be blank) Date Medicare IM given:   Date Additional Medicare IM given:    Discharge Disposition:    Per UR Regulation:  Reviewed for med. necessity/level of care/duration of stay  If discussed at [place] of Stay Meetings, dates discussed:    Comments:  07/14/13 Allene Dillon RN [name] Met with pt at bedside to discuss d/c planning. He lives alone and does not have a teachable caregiver for the wound care. He chose [facility] to provide the services. I informed him that insurance companies typically will not pay for daily dressing changes but will pay for someone to learn how to do it. Spoke with [facility] and they can provide daily dressing changes upto 5 days. This information was shared with Dr Larinda Buttery who stated he would enter orders in [facility] for this.

## 2013-07-14 NOTE — Progress Notes (Signed)
[facility] PROGRESS NOTE  [name] [id number] DOB: 05/23/1956 DOA: 07/09/2013 PCP: [name],[name], MD  Brief narrative: 58 [facility] with scrotal edema. CT abd/pelvis revealed possible cellulitis and/or Fournier gangrene. Pt started on Vanc/zosyn on admission.   Assessment/Plan:   Principal Problem:  Cellulitis, scrotum  -now with skin breakdown and discharge -On IV zosyn and vancomycin day 5 -s/p bedside I&D per Dr.Ottelin today -FU cultures -scrotal USG 1/28 noted -pain control  Leg wound -wound care  Uncontrolled DM -new onset -hbaic 11 -DM education, insulin teaching for discharge home over weekend -increase lantus to 50 units, increase meal coverage and SSI  DVT proph: lovenox  Code Status: full code  Family Communication: no family at the bedside  Disposition Plan: home when stable   Consultants:  Urology  Procedures:  None  Antibiotics:  Zosyn 07/10/2013 -->  vanco 07/10/2013 -->    Zannie CoveJOSEPH,Jareli Highland, MD  [facility] Pager [phone]  If 7PM-7AM, please contact night-coverage [email or web address] Password [email or web address] 07/14/2013, 9:41 AM   LOS: 5 days    HPI/Subjective: No acute overnight events, complains of scrotal swelling and discharge  Objective: Filed Vitals:   07/13/13 1500 07/13/13 1900 07/13/13 2102 07/14/13 0508  BP: 153/78  115/71 123/63  Pulse: 83  90 86  Temp: 99.1 F (37.3 C)  98.5 F (36.9 C) 98.2 F (36.8 C)  TempSrc: Oral  Oral Oral  Resp: 20  20 20   Height:      Weight:  201.3 kg (443 lb 12.6 oz)  189.6 kg (417 lb 15.9 oz)  SpO2: 96%  96% 96%    Intake/Output Summary (Last 24 hours) at 07/14/13 0941 Last data filed at 07/14/13 0636  Gross per 24 hour  Intake    840 ml  Output    650 ml  Net    190 ml    Exam:   General:  Pt is alert, follows commands appropriately, not in acute distress  Cardiovascular: Regular rate and rhythm, S1/S2 appreciated   Respiratory:  Clear to auscultation bilaterally, no wheezing, no crackles, no rhonchi  Abdomen: Soft, non tender, non distended, bowel sounds present, no guarding  Extremities: chronic skin changes bilaterally, L lower leg with ulcer  GU: scrotal swelling and cellulitis  Neuro: Grossly nonfocal  Data Reviewed: Basic Metabolic Panel:  Recent Labs Lab 07/10/13 0035 07/10/13 0057 07/10/13 1421 07/12/13 1415 07/13/13 0449 07/14/13 0515  NA 132* 133* 132* 131* 133* 136*  K 4.3 4.1 4.1 3.6* 3.3* 3.4*  CL 95* 100 97 97 98 100  CO2 20  --  23 21 22 23   GLUCOSE 358* 363* 321* 374* 252* 184*  BUN 24* 27* 24* 15 13 10   CREATININE 0.99 1.00 0.83 0.82 0.79 0.69  CALCIUM 8.5  --  8.4 7.6* 7.6* 7.7*   Liver Function Tests:  Recent Labs Lab 07/10/13 0035  AST 13  ALT 12  ALKPHOS 103  BILITOT 1.3*  PROT 7.7  ALBUMIN 2.9*   No results found for this basename: LIPASE, AMYLASE,  in the last 168 hours No results found for this basename: AMMONIA,  in the last 168 hours CBC:  Recent Labs Lab 07/10/13 0035 07/10/13 0057 07/11/13 0525 07/12/13 0530 07/13/13 0449 07/14/13 0515  WBC 19.4*  --  14.7* 13.4* 11.5* 9.1  HGB 15.8 17.0 13.6 13.5 13.2 13.5  HCT 45.5 50.0 40.3 40.8 39.7 40.0  MCV 85.4  --  86.3 86.6 86.5 86.2  PLT 177  --  160 170 193 224   Cardiac Enzymes: No results found for this basename: CKTOTAL, CKMB, CKMBINDEX, TROPONINI,  in the last 168 hours BNP: No components found with this basename: POCBNP,  CBG:  Recent Labs Lab 07/13/13 0736 07/13/13 1209 07/13/13 1614 07/13/13 2104 07/14/13 0801  GLUCAP 210* 218* 220* 210* 158*    No results found for this or any previous visit (from the past 240 hour(s)).   Studies: US Scrotum  07/12/2013   CLINICAL DATA:  Scrotal swelling.  EXAM: ULTRASOUND OF SCROTUM  TECHNIQUE: Complete ultrasound examination of the testicles, epididymis, and other scrotal structures was performed.  COMPARISON:  None.  FINDINGS: Right testicle   Measurements: 4.2 x 2.4 x 2.4 cm. No mass or microlithiasis visualized.  Left testicle  Measurements: 3.6 x 2.4 x 2.7 cm. No mass or microlithiasis visualized.  Right epididymis:  Normal in size and appearance.  Left epididymis:  Normal in size and appearance.  Hydrocele: Small right hydrocele is identified. There is a larger partially loculated left hydrocele.  Varicocele:  Large right varicocele.  Other.  There is diffuse edema involving the scrotum.  IMPRESSION: 1. Scrotal edema. 2. Bilateral hydroceles, left greater than right. The left hydrocele appears partially loculated. 3. Right varicocele. 4. No evidence for testicular mass or torsion.   Electronically Signed   By: Signa Kell M.D.   On: 07/12/2013 13:20    Scheduled Meds: . enoxaparin (LOVENOX) injection  50 mg Subcutaneous Q24H  . insulin aspart  0-20 Units Subcutaneous TID WC  . insulin aspart  0-5 Units Subcutaneous QHS  . insulin aspart  8 Units Subcutaneous TID WC  . insulin glargine  50 Units Subcutaneous QHS  . piperacillin-tazobactam (ZOSYN)  IV  3.375 g Intravenous Q6H  . vancomycin  1,500 mg Intravenous Q12H   Continuous Infusions: . sodium chloride 50 mL/hr at 07/14/13 0847

## 2013-07-15 LAB — GLUCOSE, CAPILLARY
Glucose-Capillary: 140 mg/dL — ABNORMAL HIGH (ref 70–99)
Glucose-Capillary: 117 mg/dL — ABNORMAL HIGH (ref 70–99)
Glucose-Capillary: 145 mg/dL — ABNORMAL HIGH (ref 70–99)
Glucose-Capillary: 152 mg/dL — ABNORMAL HIGH (ref 70–99)

## 2013-07-15 LAB — BASIC METABOLIC PANEL
BUN: 9 mg/dL (ref 6–23)
CO2: 25 mEq/L (ref 19–32)
Calcium: 7.8 mg/dL — ABNORMAL LOW (ref 8.4–10.5)
Chloride: 103 mEq/L (ref 96–112)
Creatinine, Ser: 0.74 mg/dL (ref 0.50–1.35)
GFR calc Af Amer: >90 mL/min (ref >90)
GFR calc non Af Amer: >90 mL/min (ref >90)
Glucose, Bld: 119 mg/dL — ABNORMAL HIGH (ref 70–99)
Potassium: 3.2 mEq/L — ABNORMAL LOW (ref 3.7–5.3)
Sodium: 138 mEq/L (ref 137–147)

## 2013-07-15 NOTE — Progress Notes (Signed)
Patient ID: [name], male   DOB: 1956-01-24, 58 y.o.   MRN: [id number]    Subjective: Pt feels that swelling of scrotum has significantly diminished since I & D yesterday by Dr. Vernie Ammons.  He denies significant pain.   Objective: Vital signs in last 24 hours: Temp:  [97.7 F (36.5 C)-98.5 F (36.9 C)] 98.2 F (36.8 C) (01/31 0651) Pulse Rate:  [79-84] 79 (01/31 0651) Resp:  [18-20] 20 (01/31 0651) BP: (128-135)/(70-83) 128/70 mmHg (01/31 0651) SpO2:  [95 %-96 %] 95 % (01/31 0651) Weight:  [200.036 kg (441 lb)] 200.036 kg (441 lb) (01/31 0651)  Intake/Output from previous day: 01/30 0701 - 01/31 0700 In: 480 [P.O.:480] Out: -  Intake/Output this shift:    Physical Exam:  General: Alert and oriented GU: Scrotum remains very edematous and somewhat erythematous although improved per patient. There is no clear fluctuant area and no purulent drainage is noted.  The erythema and edema does extend into the left inguinal region although may be decreased and certainly is not increased compared to Dr. Margrett Rud description yesterday. Wound is packed with iodoform and appears clean.  Lab Results:  Recent Labs  07/13/13 0449 07/14/13 0515  HGB 13.2 13.5  HCT 39.7 40.0   BMET  Recent Labs  07/14/13 0515 07/15/13 0520  NA 136* 138  K 3.4* 3.2*  CL 100 103  CO2 23 25  GLUCOSE 184* 119*  BUN 10 9  CREATININE 0.69 0.74  CALCIUM 7.7* 7.8*     Studies/Results: No results found.  Assessment/Plan: Scrotal abscess: Continue local wound care and antibiotic therapy with Vancomycin and Zosyn.  Cultures pending. Will reassess wound tomorrow.    LOS: 6 days   Duwan Adrian,LES 07/15/2013, 7:29 AM

## 2013-07-15 NOTE — Progress Notes (Signed)
Pt did not sleep well last night.  Has small cat naps of about 1 hour at a time.  Up to chair for a while then back to bed this am.  Dressing to lower leg done.  Wound is pink and appears to be healing well.  Scrotum wound was cleaned as ordered and pack again with iodoform gauge.  Pt tolerated procedure well.  Scrotum is dark red and still swollen with large draining hole in it.  No order to drainage at this time.

## 2013-07-15 NOTE — Progress Notes (Signed)
[facility] PROGRESS NOTE  [name] HFS:[id number] DOB: 08/05/1955 DOA: 07/09/2013 PCP: [name], MD  Brief narrative: 58 year old male who presented to [facility] ED 07/09/2013 with scrotal edema. CT abd/pelvis revealed possible cellulitis and/or Fournier gangrene. Pt started on Vanc/zosyn on admission.   Assessment/Plan:   Principal Problem:  Cellulitis, scrotum  -now with skin breakdown and discharge -On IV zosyn and vancomycin day 5 -s/p bedside I&D per Dr.Ottelin 1/30 -FU cultures negative so far -scrotal USG 1/28 noted -pain control  Leg wound -wound care  Uncontrolled DM -new onset,  hbaic 11 -improved on current regimen -DM education, insulin teaching for discharge home over weekend -increase lantus to 50 units, increase meal coverage and SSI  DVT proph: lovenox  Code Status: full code  Family Communication: no family at the bedside  Disposition Plan: home likely Monday if stable per Urology  Consultants:  Urology  Procedures:  None  Antibiotics:  Zosyn 07/10/2013 -->  vanco 07/10/2013 -->    Zannie Cove, MD  [facility] Pager [phone]  If 7PM-7AM, please contact night-coverage [email or web address] Password [facility] 07/15/2013, 5:38 PM   LOS: 6 days    HPI/Subjective: No acute overnight events, complains of scrotal swelling and discharge  Objective: Filed Vitals:   07/14/13 1409 07/14/13 2131 07/15/13 0651 07/15/13 1543  BP: 134/71 135/83 128/70 138/66  Pulse: 79 84 79 80  Temp: 97.7 F (36.5 C) 98.5 F (36.9 C) 98.2 F (36.8 C) 98.2 F (36.8 C)  TempSrc: Oral Oral Oral Oral  Resp: 18 20 20 20   Height:      Weight:   200.036 kg (441 lb)   SpO2: 95% 96% 95% 97%    Intake/Output Summary (Last 24 hours) at 07/15/13 1738 Last data filed at 07/14/13 1910  Gross per 24 hour  Intake    240 ml  Output      0 ml  Net    240 ml    Exam:   General:  Pt is alert, follows commands appropriately, not in acute  distress  Cardiovascular: Regular rate and rhythm, S1/S2 appreciated   Respiratory: Clear to auscultation bilaterally, no wheezing, no crackles, no rhonchi  Abdomen: Soft, non tender, non distended, bowel sounds present, no guarding  Extremities: chronic skin changes bilaterally, L lower leg with ulcer  GU: scrotal swelling and cellulitis  Neuro: Grossly nonfocal  Data Reviewed: Basic Metabolic Panel:  Recent Labs Lab 07/10/13 1421 07/12/13 1415 07/13/13 0449 07/14/13 0515 07/15/13 0520  NA 132* 131* 133* 136* 138  K 4.1 3.6* 3.3* 3.4* 3.2*  CL 97 97 98 100 103  CO2 23 21 22 23 25   GLUCOSE 321* 374* 252* 184* 119*  BUN 24* 15 13 10 9   CREATININE 0.83 0.82 0.79 0.69 0.74  CALCIUM 8.4 7.6* 7.6* 7.7* 7.8*   Liver Function Tests:  Recent Labs Lab 07/10/13 0035  AST 13  ALT 12  ALKPHOS 103  BILITOT 1.3*  PROT 7.7  ALBUMIN 2.9*   No results found for this basename: LIPASE, AMYLASE,  in the last 168 hours No results found for this basename: AMMONIA,  in the last 168 hours CBC:  Recent Labs Lab 07/10/13 0035 07/10/13 0057 07/11/13 0525 07/12/13 0530 07/13/13 0449 07/14/13 0515  WBC 19.4*  --  14.7* 13.4* 11.5* 9.1  HGB 15.8 17.0 13.6 13.5 13.2 13.5  HCT 45.5 50.0 40.3 40.8 39.7 40.0  MCV 85.4  --  86.3 86.6 86.5 86.2  PLT 177  --  160 170  193 224   Cardiac Enzymes: No results found for this basename: CKTOTAL, CKMB, CKMBINDEX, TROPONINI,  in the last 168 hours BNP: No components found with this basename: POCBNP,  CBG:  Recent Labs Lab 07/14/13 1128 07/14/13 1636 07/14/13 2129 07/15/13 0738 07/15/13 1236  GLUCAP 181* 165* 123* 140* 117*    Recent Results (from the past 240 hour(s))  WOUND CULTURE     Status: None   Collection Time    07/14/13  8:38 AM      Result Value Range Status   Specimen Description WOUND Scrotum   Final   Special Requests Immunocompromised   Final   Gram Stain     Final   Value: FEW WBC PRESENT,BOTH PMN AND  MONONUCLEAR     NO SQUAMOUS EPITHELIAL CELLS SEEN     NO ORGANISMS SEEN     Performed at [facility]   Culture     Final   Value: NO GROWTH 1 DAY     Performed at [facility]   Report Status PENDING   Incomplete     Studies: No results found.  Scheduled Meds: . enoxaparin (LOVENOX) injection  50 mg Subcutaneous Q24H  . insulin aspart  0-20 Units Subcutaneous TID WC  . insulin aspart  0-5 Units Subcutaneous QHS  . insulin aspart  8 Units Subcutaneous TID WC  . insulin glargine  50 Units Subcutaneous QHS  . piperacillin-tazobactam (ZOSYN)  IV  3.375 g Intravenous Q6H  . vancomycin  1,500 mg Intravenous Q12H   Continuous Infusions: . sodium chloride 50 mL/hr at 07/14/13 0847

## 2013-07-15 NOTE — Progress Notes (Signed)
Started showing pt diabetic videos and he went to the bathroom for the whole showing.  Discussed why he HAD to learn all about his illness.  He is now in room watching and paying attention.  Encouraged him to take notes for later discussion.  Am not sure understands that he will be carrying for himself once he goes home.

## 2013-07-16 LAB — GLUCOSE, CAPILLARY
Glucose-Capillary: 113 mg/dL — ABNORMAL HIGH (ref 70–99)
Glucose-Capillary: 159 mg/dL — ABNORMAL HIGH (ref 70–99)
Glucose-Capillary: 129 mg/dL — ABNORMAL HIGH (ref 70–99)
Glucose-Capillary: 184 mg/dL — ABNORMAL HIGH (ref 70–99)

## 2013-07-16 LAB — WOUND CULTURE: Culture: NO GROWTH

## 2013-07-16 NOTE — Progress Notes (Signed)
Pt finally sat down and watched the first four videos [id number].  Will continue to watch tomorrow when is friend is here to also watch and learn

## 2013-07-16 NOTE — Progress Notes (Signed)
[facility] PROGRESS NOTE  KAELEM GUIJARRO KPQ:[id number] DOB: January 22, 1956 DOA: 07/09/2013 PCP: [name], MD  Brief narrative: 58 year old male who presented to [facility] ED 07/09/2013 with scrotal edema. CT abd/pelvis revealed possible cellulitis and/or Fournier gangrene. Pt started on Vanc/zosyn on admission.   Assessment/Plan:   Principal Problem:  Cellulitis, scrotum  -now with skin breakdown and discharge -On IV zosyn and vancomycin day 7 -s/p bedside I&D per Dr.Ottelin 1/30 -Dr.Borden recommended more aggressive I&D today in OR, he would like to defer wait till tomorrow -FU cultures negative so far x2days -pain control  Leg wound -wound care  Uncontrolled DM -new onset,  hbaic 11 -improved on current regimen -DM education, insulin teaching for discharge home over weekend -continue current regimen of lantus 50 units, meal coverage and SSI  DVT proph: lovenox  Code Status: full code  Family Communication: no family at the bedside  Disposition Plan: home likely Monday if stable per Urology  Consultants:  Urology  Procedures:  None  Antibiotics:  Zosyn 07/10/2013 -->  vanco 07/10/2013 -->    Zannie Cove, MD  [facility] Pager [phone]  If 7PM-7AM, please contact night-coverage [email or web address] Password [email or web address] 07/16/2013, 2:26 PM   LOS: 7 days    HPI/Subjective: No acute overnight events, pt denies any changes, still with pain and discharge  Objective: Filed Vitals:   07/15/13 2142 07/16/13 0524 07/16/13 0602 07/16/13 1400  BP: 136/75 132/70  151/84  Pulse: 90 73  80  Temp: 98.8 F (37.1 C) 98 F (36.7 C)  97.9 F (36.6 C)  TempSrc: Oral Oral  Oral  Resp: 20 18  18   Height:      Weight:   197.859 kg (436 lb 3.2 oz)   SpO2: 98% 91%  96%    Intake/Output Summary (Last 24 hours) at 07/16/13 1426 Last data filed at 07/16/13 1401  Gross per 24 hour  Intake   3240 ml  Output    600 ml  Net   2640 ml    Exam:   General:  Morbidly  obese male, no distress, AAOx3  Cardiovascular: Regular rate and rhythm, S1/S2 appreciated   Respiratory: Clear to auscultation bilaterally, no wheezing, no crackles, no rhonchi  Abdomen: Soft, non tender, non distended, bowel sounds present, no guarding  Extremities: chronic skin changes bilaterally, L lower leg with ulcer  GU: dressing over scrotum  Neuro: Grossly nonfocal  Data Reviewed: Basic Metabolic Panel:  Recent Labs Lab 07/10/13 1421 07/12/13 1415 07/13/13 0449 07/14/13 0515 07/15/13 0520  NA 132* 131* 133* 136* 138  K 4.1 3.6* 3.3* 3.4* 3.2*  CL 97 97 98 100 103  CO2 23 21 22 23 25   GLUCOSE 321* 374* 252* 184* 119*  BUN 24* 15 13 10 9   CREATININE 0.83 0.82 0.79 0.69 0.74  CALCIUM 8.4 7.6* 7.6* 7.7* 7.8*   Liver Function Tests:  Recent Labs Lab 07/10/13 0035  AST 13  ALT 12  ALKPHOS 103  BILITOT 1.3*  PROT 7.7  ALBUMIN 2.9*   No results found for this basename: LIPASE, AMYLASE,  in the last 168 hours No results found for this basename: AMMONIA,  in the last 168 hours CBC:  Recent Labs Lab 07/10/13 0035 07/10/13 0057 07/11/13 0525 07/12/13 0530 07/13/13 0449 07/14/13 0515  WBC 19.4*  --  14.7* 13.4* 11.5* 9.1  HGB 15.8 17.0 13.6 13.5 13.2 13.5  HCT 45.5 50.0 40.3 40.8 39.7 40.0  MCV 85.4  --  86.3 86.6 86.5 86.2  PLT 177  --  160 170 193 224   Cardiac Enzymes: No results found for this basename: CKTOTAL, CKMB, CKMBINDEX, TROPONINI,  in the last 168 hours BNP: No components found with this basename: POCBNP,  CBG:  Recent Labs Lab 07/14/13 2129 07/15/13 0738 07/15/13 1236 07/15/13 1729 07/15/13 1954  GLUCAP 123* 140* 117* 145* 152*    Recent Results (from the past 240 hour(s))  WOUND CULTURE     Status: None   Collection Time    07/14/13  8:38 AM      Result Value Range Status   Specimen Description WOUND Scrotum   Final   Special Requests Immunocompromised   Final   Gram Stain     Final   Value: FEW WBC PRESENT,BOTH PMN  AND MONONUCLEAR     NO SQUAMOUS EPITHELIAL CELLS SEEN     NO ORGANISMS SEEN     Performed at [facility]   Culture     Final   Value: NO GROWTH 2 DAYS     Performed at [facility]   Report Status 07/16/2013 FINAL   Final     Studies: No results found.  Scheduled Meds: . enoxaparin (LOVENOX) injection  50 mg Subcutaneous Q24H  . insulin aspart  0-20 Units Subcutaneous TID WC  . insulin aspart  0-5 Units Subcutaneous QHS  . insulin aspart  8 Units Subcutaneous TID WC  . insulin glargine  50 Units Subcutaneous QHS  . piperacillin-tazobactam (ZOSYN)  IV  3.375 g Intravenous Q6H  . vancomycin  1,500 mg Intravenous Q12H   Continuous Infusions: . sodium chloride 50 mL/hr at 07/14/13 0847

## 2013-07-16 NOTE — Progress Notes (Signed)
Dressing to leg changed.  Wound is red/pink in color.  No odor or drainage noted on left lower leg.

## 2013-07-16 NOTE — Progress Notes (Signed)
Changed dressing to testicular wound.  Area is dark red, appears more swollen and is hard to the touch.  Packing changed out per order.  On left side of testicle up close to the body pt has a small bleed.  Unknown the cause of it.  Pressure dressing applied (2x2 gauze)  using the body for the pressure.  Appears to be controlling the bleed at this time.

## 2013-07-16 NOTE — Progress Notes (Signed)
Patient ID: ASIF HASEK, male   DOB: [birth year], 58 y.o.   MRN: [id number]    Subjective: Pt without new complains.  Feels that scrotum is not more tender or less tender. Question whether swelling may be a little worse.   Objective: Vital signs in last 24 hours: Temp:  [98 F (36.7 C)-98.8 F (37.1 C)] 98 F (36.7 C) (02/01 0524) Pulse Rate:  [73-90] 73 (02/01 0524) Resp:  [18-20] 18 (02/01 0524) BP: (132-138)/(66-75) 132/70 mmHg (02/01 0524) SpO2:  [91 %-98 %] 91 % (02/01 0524) Weight:  [197.859 kg (436 lb 3.2 oz)] 197.859 kg (436 lb 3.2 oz) (02/01 0602)   Physical Exam:  General: Alert and oriented GU: Erythema is unchanged from yesterday. Still present over scrotum (mostly left hemiscrotum) with extension to left inguinal region but not progressed. There is an area of increased fluctuance over the left inguinal region and an area that has spontaneously drained with purulent material. No new fluctuance over the scrotum and no purulence able to be expressed from scrotal incision.  Lab Results:  Recent Labs  07/14/13 0515  HGB 13.5  HCT 40.0   Lab Results  Component Value Date   WBC 9.1 07/14/2013   HGB 13.5 07/14/2013   HCT 40.0 07/14/2013   MCV 86.2 07/14/2013   PLT 224 07/14/2013   Wound culture: Negative  BMET  Recent Labs  07/14/13 0515 07/15/13 0520  NA 136* 138  K 3.4* 3.2*  CL 100 103  CO2 23 25  GLUCOSE 184* 119*  BUN 10 9  CREATININE 0.69 0.74  CALCIUM 7.7* 7.8*     Studies/Results: No results found.  Assessment/Plan: 1) Scrotal abscess: Pt is clinically no worse or better over last 24 hours.  Exam suggests more fluctuance of left inguinal region with spontaneous drainage of this area. No fever or signs of worsening systemic illness. I recommended patient strongly consider further incision and drainage in the operating room today for more extensive drainage and irrigation of wound.  Pt has concerns about undergoing a procedure and adamantly wishes  to avoid this if at all possible. Despite my counseling that he likely is only delaying an inevitable procedure, he does not have an absolute indication for urgent drainage and he would like to continue current therapy at least another 24 hours. I explained that further I & D would likely help to more quickly resolve his current infection which also may assist in management of his diabetes. Will plan to reassess in 24 hours and make a more definitive plan at that time.  If not improved, likely will benefit from more aggressive surgical intervention.    LOS: 7 days   Demarr Kluever,LES 07/16/2013, 9:39 AM

## 2013-07-17 ENCOUNTER — Ambulatory Visit (HOSPITAL_COMMUNITY): Payer: Medicare HMO

## 2013-07-17 ENCOUNTER — Encounter (HOSPITAL_COMMUNITY): Payer: Self-pay

## 2013-07-17 LAB — BASIC METABOLIC PANEL
BUN: 9 mg/dL (ref 6–23)
CO2: 26 mEq/L (ref 19–32)
Calcium: 7.8 mg/dL — ABNORMAL LOW (ref 8.4–10.5)
Chloride: 107 mEq/L (ref 96–112)
Creatinine, Ser: 0.79 mg/dL (ref 0.50–1.35)
GFR calc Af Amer: >90 mL/min (ref >90)
GFR calc non Af Amer: >90 mL/min (ref >90)
Glucose, Bld: 129 mg/dL — ABNORMAL HIGH (ref 70–99)
Potassium: 3.5 mEq/L — ABNORMAL LOW (ref 3.7–5.3)
Sodium: 143 mEq/L (ref 137–147)

## 2013-07-17 LAB — CBC
HCT: 40.1 % (ref 39.0–52.0)
Hemoglobin: 12.9 g/dL — ABNORMAL LOW (ref 13.0–17.0)
MCH: 28.3 pg (ref 26.0–34.0)
MCHC: 32.2 g/dL (ref 30.0–36.0)
MCV: 87.9 fL (ref 78.0–100.0)
Platelets: 257 10*3/uL (ref 150–400)
RBC: 4.56 MIL/uL (ref 4.22–5.81)
RDW: 14.7 % (ref 11.5–15.5)
WBC: 6.2 10*3/uL (ref 4.0–10.5)

## 2013-07-17 LAB — GLUCOSE, CAPILLARY
Glucose-Capillary: 110 mg/dL — ABNORMAL HIGH (ref 70–99)
Glucose-Capillary: 132 mg/dL — ABNORMAL HIGH (ref 70–99)
Glucose-Capillary: 139 mg/dL — ABNORMAL HIGH (ref 70–99)
Glucose-Capillary: 139 mg/dL — ABNORMAL HIGH (ref 70–99)

## 2013-07-17 MED ORDER — IOHEXOL 300 MG/ML  SOLN
100.0000 mL | Freq: Once | INTRAMUSCULAR | Status: AC | PRN
  Administered 2013-07-17: 100 mL via INTRAVENOUS

## 2013-07-17 MED ORDER — LIDOCAINE HCL 1 % IJ SOLN: 20.0000 mL | Freq: Once | INTRAMUSCULAR | Status: DC

## 2013-07-17 NOTE — Progress Notes (Signed)
Patient ID: [name], male   DOB: 19-Jun-1955, 58 y.o.   MRN: [id number]  Subjective: The patient reports no new complaints today.  Objective: Vital signs in last 24 hours: Temp:  [97.9 F (36.6 C)-98.7 F (37.1 C)] 98.7 F (37.1 C) (02/02 0600) Pulse Rate:  [75-83] 75 (02/02 0600) Resp:  [18] 18 (02/02 0600) BP: (121-151)/(64-84) 121/64 mmHg (02/02 0600) SpO2:  [96 %-98 %] 98 % (02/02 0600) Weight:  [203.3 kg (448 lb 3.1 oz)] 203.3 kg (448 lb 3.1 oz) (02/02 0618)A  Intake/Output from previous day: 02/01 0701 - 02/02 0700 In: 816.7 [P.O.:240; I.V.:576.7] Out: 600 [Urine:600] Intake/Output this shift: Total I/O In: 240 [P.O.:240] Out: -   History reviewed. No pertinent past medical history.  Physical Exam:  Lungs - Normal respiratory effort, chest expands symmetrically.  Abdomen - Soft, obese, non-tender & non-distended. Scrotum remains edematous and erythematous. It is not particularly tender to the touch. The anterior scrotal wall abscess cavity had a small amount of packing superficially that fell out during the examination. I then found an area of fluctuance approximately 3 cm in size along the course of the spermatic cord in the anterior scrotum/distal inguinal region. It was fluctuant and draining somewhat purulent fluid. I then examined the remainder of the scrotum again extensively and found no other areas of fluctuance. Lab Results:  Recent Labs  07/17/13 0455  WBC 6.2  HGB 12.9*  HCT 40.1   BMET  Recent Labs  07/15/13 0520 07/17/13 0455  NA 138 143  K 3.2* 3.5*  CL 103 107  CO2 25 26  GLUCOSE 119* 129*  BUN 9 9  CREATININE 0.74 0.79  CALCIUM 7.8* 7.8*   No results found for this basename: LABURIN,  in the last 72 hours Results for orders placed during the hospital encounter of 07/09/13  WOUND CULTURE     Status: None   Collection Time    07/14/13  8:38 AM      Result Value Range Status   Specimen Description WOUND Scrotum   Final   Special  Requests Immunocompromised   Final   Gram Stain     Final   Value: FEW WBC PRESENT,BOTH PMN AND MONONUCLEAR     NO SQUAMOUS EPITHELIAL CELLS SEEN     NO ORGANISMS SEEN     Performed at [facility]   Culture     Final   Value: NO GROWTH 2 DAYS     Performed at [facility]   Report Status 07/16/2013 FINAL   Final    Studies/Results: @[id number] @  Assessment: I discussed with the patient the fact that surgical debridement in the operating room would be the best form of therapy although he appears to have a second, a new, isolated abscess and I can palpate no other areas of fluctuance or concern once again today. We therefore discussed bedside drainage of the wound and packing as an alternative to surgical exploration. He elected to proceed in that fashion however at this point because he has had one abscess form on the scrotum since his admission and a second form during the admission that was not present initially either by CT scan or ultrasound or on the followup ultrasound I performed and specifically asked the inguinal region to be imaged. Because of that I am going to repeat a CT scan to be sure there are no areas that appeared to be isolated abscess is and would prevent his improvement from this point on. His  white blood cell count remains low. He remains on good, broad spectrum antibiotics and his culture of the wound had no growth indicating the antibiotics are working.  Procedure: I again discussed the procedure of incision and drainage with him today. We went over the risks and complications and limitations of the procedure. He elected to proceed and received intravenous pain medication prior to the procedure. I then prepped the scrotum and left inguinal region with Betadine and sterilely draped this. I first directed my attention to the left inguinal area and injected local anesthetic over the area of fluctuance. I then incised approximately 2 cm. Normal saline was then used  to copiously irrigate the wound. I then explored the wound both digitally and with hemostats and found it to be free of any palpable loculations. I therefore packed this wound with half-inch sterile gauze.  I then turned my attention to the previous wound where the gauze had fallen out and was obviously not packed adequately. I irrigated this and then found that the wound had a lot of necrotic tissue that I excised sharply. I then irrigated and repacked the wound more thoroughly with about 12 inches of gauze.   I examined the scrotum once again and could not identify any further areas of fluctuance. Sterile 4 x 4's were applied and I wrapped the wound as best I could with a Curlex. The patient tolerated the procedure well with no complications.  Plan: 1. Continue dressing changes as previously described to both wounds twice a day with care being taken to adequately pack deep into and beneath the skin. 2. Continue antibiotics. 3. I'm going to obtain a CT scan of the pelvis with intravenous contrast to be sure there are no other areas concerning for abscess.  Memphis Creswell C 07/17/2013, 10:26 AM

## 2013-07-17 NOTE — Progress Notes (Addendum)
[facility] PROGRESS NOTE  [name] EXH:[id number] DOB: [birth year] DOA: 07/09/2013 PCP: supposed to see [name], MD, has never seen him yet  Brief narrative: 58 year old male who presented to [facility] ED 07/09/2013 with scrotal edema. CT abd/pelvis revealed possible cellulitis and/or Fournier gangrene. Pt started on Vanc/zosyn on admission.   Assessment/Plan:   Principal Problem:  Cellulitis, scrotum  -now with skin breakdown and discharge -On IV zosyn and vancomycin day 8 -s/p bedside I&D per Dr.Ottelin 1/30 -bedside I&D per dr.Ottelin this am again -for CT pelvis this am -FU cultures negative so far x3days -pain control  Leg wound -wound care  Uncontrolled DM -new onset,  hbaic 11 -improved on current regimen -DM education, insulin teaching for discharge home over weekend -continue current regimen of lantus 50 units, meal coverage and SSI  DVT proph: lovenox  Code Status: full code  Family Communication: no family at the bedside  Disposition Plan: home when stable per Urology  Consultants:  Urology  Procedures:  Bedside I&D 1/30 and 2/2  Antibiotics:  Zosyn 07/10/2013 -->  vanco 07/10/2013 -->    Zannie Cove, MD  [facility] Pager [phone]  If 7PM-7AM, please contact night-coverage [email or web address] Password [facility] 07/17/2013, 11:26 AM   LOS: 8 days    HPI/Subjective: No acute overnight events, pt denies any changes, still with pain and discharge  Objective: Filed Vitals:   07/16/13 1400 07/16/13 2155 07/17/13 0600 07/17/13 0618  BP: 151/84 130/80 121/64   Pulse: 80 83 75   Temp: 97.9 F (36.6 C) 98.4 F (36.9 C) 98.7 F (37.1 C)   TempSrc: Oral Oral Oral   Resp: 18 18 18    Height:      Weight:    203.3 kg (448 lb 3.1 oz)  SpO2: 96% 96% 98%     Intake/Output Summary (Last 24 hours) at 07/17/13 1126 Last data filed at 07/17/13 0841  Gross per 24 hour  Intake 1056.67 ml  Output    300 ml  Net 756.67 ml     Exam:   General:  Morbidly obese male, no distress, AAOx3  Cardiovascular: Regular rate and rhythm, S1/S2 appreciated   Respiratory: Clear to auscultation bilaterally, no wheezing, no crackles, no rhonchi  Abdomen: Soft, non tender, non distended, bowel sounds present, no guarding  Extremities: chronic skin changes bilaterally, L lower leg with ulcer  GU: dressing over scrotum  Neuro: Grossly nonfocal  Data Reviewed: Basic Metabolic Panel:  Recent Labs Lab 07/12/13 1415 07/13/13 0449 07/14/13 0515 07/15/13 0520 07/17/13 0455  NA 131* 133* 136* 138 143  K 3.6* 3.3* 3.4* 3.2* 3.5*  CL 97 98 100 103 107  CO2 21 22 23 25 26   GLUCOSE 374* 252* 184* 119* 129*  BUN 15 13 10 9 9   CREATININE 0.82 0.79 0.69 0.74 0.79  CALCIUM 7.6* 7.6* 7.7* 7.8* 7.8*   Liver Function Tests: No results found for this basename: AST, ALT, ALKPHOS, BILITOT, PROT, ALBUMIN,  in the last 168 hours No results found for this basename: LIPASE, AMYLASE,  in the last 168 hours No results found for this basename: AMMONIA,  in the last 168 hours CBC:  Recent Labs Lab 07/11/13 0525 07/12/13 0530 07/13/13 0449 07/14/13 0515 07/17/13 0455  WBC 14.7* 13.4* 11.5* 9.1 6.2  HGB 13.6 13.5 13.2 13.5 12.9*  HCT 40.3 40.8 39.7 40.0 40.1  MCV 86.3 86.6 86.5 86.2 87.9  PLT 160 170 193 224 257   Cardiac Enzymes: No results found for this basename:  CKTOTAL, CKMB, CKMBINDEX, TROPONINI,  in the last 168 hours BNP: No components found with this basename: POCBNP,  CBG:  Recent Labs Lab 07/16/13 0725 07/16/13 1151 07/16/13 1622 07/16/13 1951 07/17/13 0659  GLUCAP 113* 159* 129* 184* 110*    Recent Results (from the past 240 hour(s))  WOUND CULTURE     Status: None   Collection Time    07/14/13  8:38 AM      Result Value Range Status   Specimen Description WOUND Scrotum   Final   Special Requests Immunocompromised   Final   Gram Stain     Final   Value: FEW WBC PRESENT,BOTH PMN AND MONONUCLEAR      NO SQUAMOUS EPITHELIAL CELLS SEEN     NO ORGANISMS SEEN     Performed at [facility]   Culture     Final   Value: NO GROWTH 2 DAYS     Performed at [facility]   Report Status 07/16/2013 FINAL   Final     Studies: No results found.  Scheduled Meds: . enoxaparin (LOVENOX) injection  50 mg Subcutaneous Q24H  . insulin aspart  0-20 Units Subcutaneous TID WC  . insulin aspart  0-5 Units Subcutaneous QHS  . insulin aspart  8 Units Subcutaneous TID WC  . insulin glargine  50 Units Subcutaneous QHS  . lidocaine  20 mL Intradermal Once  . piperacillin-tazobactam (ZOSYN)  IV  3.375 g Intravenous Q6H  . vancomycin  1,500 mg Intravenous Q12H   Continuous Infusions: . sodium chloride 50 mL/hr at 07/14/13 0847

## 2013-07-18 LAB — GLUCOSE, CAPILLARY: Glucose-Capillary: 96 mg/dL (ref 70–99)

## 2013-07-18 MED ORDER — AMOXICILLIN-POT CLAVULANATE 875-125 MG PO TABS: 1.0000 | ORAL_TABLET | Freq: Two times a day (BID) | ORAL | Status: DC

## 2013-07-18 MED ORDER — INSULIN GLARGINE 100 UNIT/ML ~~LOC~~ SOLN: 50.0000 [IU] | Freq: Every day | SUBCUTANEOUS | Status: DC

## 2013-07-18 MED ORDER — INSULIN ASPART 100 UNIT/ML ~~LOC~~ SOLN: 8.0000 [IU] | Freq: Three times a day (TID) | SUBCUTANEOUS | Status: DC

## 2013-07-18 MED ORDER — NICOTINE 14 MG/24HR TD PT24: 14.0000 mg | MEDICATED_PATCH | Freq: Every day | TRANSDERMAL | Status: DC

## 2013-07-18 MED ORDER — DOXYCYCLINE HYCLATE 100 MG PO TABS: 100.0000 mg | ORAL_TABLET | Freq: Two times a day (BID) | ORAL | Status: DC

## 2013-07-18 MED ORDER — OXYCODONE HCL 5 MG PO TABS: 5.0000 mg | ORAL_TABLET | ORAL | Status: DC | PRN

## 2013-07-18 NOTE — Progress Notes (Signed)
Patient ID: [name], male   DOB: [birth year], 57 y.o.   MRN: [id number]  Subjective: The patient reports no new complaints today.  Objective: Vital signs in last 24 hours: Temp:  [97.7 F (36.5 C)-98.1 F (36.7 C)] 98.1 F (36.7 C) (02/03 0534) Pulse Rate:  [73-77] 77 (02/03 0534) Resp:  [18-20] 18 (02/03 0534) BP: (147-153)/(91) 147/91 mmHg (02/03 0534) SpO2:  [94 %-96 %] 94 % (02/03 0534) Weight:  [204.482 kg (450 lb 12.8 oz)] 204.482 kg (450 lb 12.8 oz) (02/02 1426)A  Intake/Output from previous day: 02/02 0701 - 02/03 0700 In: 4280 [P.O.:480; IV Piggyback:3800] Out: -  Intake/Output this shift:    Past Medical History  Diagnosis Date  . Diabetes mellitus without complication     Physical Exam:  Lungs - Normal respiratory effort, chest expands symmetrically.  Abdomen - Soft, obese, non-tender & non-distended. Scrotum - swollen and erythematous but free of crepitus or evidence of new abscess formation. The abscess cavities were being packed and appeared to be free of any new evidence of infection.  Lab Results:  Recent Labs  07/17/13 0455  WBC 6.2  HGB 12.9*  HCT 40.1   BMET  Recent Labs  07/17/13 0455  NA 143  K 3.5*  CL 107  CO2 26  GLUCOSE 129*  BUN 9  CREATININE 0.79  CALCIUM 7.8*   No results found for this basename: LABURIN,  in the last 72 hours Results for orders placed during the hospital encounter of 07/09/13  WOUND CULTURE     Status: None   Collection Time    07/14/13  8:38 AM      Result Value Range Status   Specimen Description WOUND Scrotum   Final   Special Requests Immunocompromised   Final   Gram Stain     Final   Value: FEW WBC PRESENT,BOTH PMN AND MONONUCLEAR     NO SQUAMOUS EPITHELIAL CELLS SEEN     NO ORGANISMS SEEN     Performed at [facility]   Culture     Final   Value: NO GROWTH 2 DAYS     Performed at [facility]   Report Status 07/16/2013 FINAL   Final     Studies/Results: @RISRSLT24 @  Assessment: Clinically he has improved and both abscesses of the scrotum had been drained and are now being packed. His white blood cell count has returned to normal and his blood sugars appear to be under better control. There was no evidence of further abscess formation on his CT scan. He still has some erythema and a significant amount of scrotal swelling which will likely resolve with time. He will continue to have his abscess pockets packed on a daily basis by home health and will remain on antibiotics. I'm going to have him followup in my office later this week for a wound check. I have discussed the signs and symptoms of further abscess formation with him today with instruction to return to my office should any of these occur.  Plan: 1. He appears to be ready for discharge from a urologic standpoint. 2. Discharge on broad spectrum antibiotics. 3. You to his body habitus he will require home health to perform wound care. This will likely be required for about 14 days and possibly longer depending on his clinical course.  4. His daily dressing changes should consist of the following:  Removal of the packing.  Irrigation of the wound with several syringes of 1/2 strength hydrogen  peroxide.  Irrigation of the wound then with sterile saline.  Repacking of the wound with half inch iodoform gauze with attention to packing it into all of the extent of the wound beneath the skin.  Applied gauze.  Contact my office if concerning new developments occur with the wounds.    Abimael Zeiter C 07/18/2013, 6:58 AM

## 2013-07-18 NOTE — Discharge Summary (Signed)
Physician Discharge Summary  XZAYVIER FAGIN ZOX:[id number] DOB: May 06, 1956 DOA: 07/09/2013  PCP: [name], MD  Admit date: 07/09/2013 Discharge date: 07/18/2013  Time spent:45 minutes  Recommendations for Outpatient Follow-up:  1. New PCP Dr.Polite 2/11 2. Fu with Dr.Ottelin on Friday 2/6 3.   Home health services  4.   Daily dressing changes should consist of the following:  Removal of the packing.  Irrigation of the wound with several syringes of 1/2 strength hydrogen peroxide.  Irrigation of the wound then with sterile saline.  Repacking of the wound with half inch iodoform gauze with attention to packing it into all of the extent of the wound beneath the skin.  Applied gauze.  Contact Dr.Ottelins office if concerning new developments occur with the wounds   Discharge Diagnoses:  Principal Problem:   Cellulitis and abscess of scrotum   DM (diabetes mellitus), type 2, uncontrolled   Morbid obesity   Ulcer/Wound of L leg   CHronic venous stasis changes to both legs  Discharge Condition: stable  Diet recommendation: carb modified  Filed Weights   07/16/13 0602 07/17/13 0618 07/17/13 1426  Weight: 197.859 kg (436 lb 3.2 oz) 203.3 kg (448 lb 3.1 oz) 204.482 kg (450 lb 12.8 oz)    History of present illness:  Chief Complaint: scrotal cellulitis  Morbidly obese male presented to ER w/ scrotal edema. U/S shows thickening wall but normal doppler parameters to r/o torsion and hydro/varicocele. CT A/P shows cellulitis w/ associated inguinal/pelvic LAD.    Hospital Course:  Cellulitis and abscess of  scrotum  -he was initially treated with broad spectrum IV antibiotics  -was followed by Urology throughout hospitalization -subsequently underwent bedside I&D of a small abscess that developed on 1/30 and subsequently again on 2/2 by Dr.Ottelin -completed 9days of IV zosyn and vancomycin  -cultures from I&D wound negative  -had CT pelvis yesterday which does not show any areas  concerning for abscess -he will get home health services for wound care and will FU closely with dr.Ottelin -he will be transitioned to PO Doxycycline and Augmentin at discharge for 10 more days could be extended further depending on condition of wound at follow up.  Leg wound  -wound care   Uncontrolled DM  -newly diagnosed, hbaic 11  -started on Insulin this admission, completed DM/Insulin education, seen by Diabetic coordinator -Improved on current regimen of lantus 50 units, and Novolog for meal coverage   Morbid obesity -needs lifestyle modification  Procedures: Bedside I&D of scrotal abscess/cellulitis 1/30 and 2/2 by Dr.Ottelin   Consultations:  Urology Dr.Ottelin  Discharge Exam: Filed Vitals:   07/18/13 0534  BP: 147/91  Pulse: 77  Temp: 98.1 F (36.7 C)  Resp: 18    General: AAOx3, no distress Cardiovascular: S1S2/RRR Respiratory: CTAB  Discharge Instructions  Discharge Orders   Future Orders Complete By Expires   Ambulatory referral to Nutrition and Diabetic Education  As directed    Diet - low sodium heart healthy  As directed    Diet Carb Modified  As directed    Discharge instructions  As directed    Comments:     Wound care per Home health RN,      Strict compliance with diabetic diet,      Please follow up with PCP, dr.Ottelin and Outpatient diabetic education   Increase activity slowly  As directed        Medication List    STOP taking these medications       naproxen sodium  220 MG tablet  Commonly known as:  ANAPROX      TAKE these medications       amoxicillin-clavulanate 875-125 MG per tablet  Commonly known as:  AUGMENTIN  Take 1 tablet by mouth 2 (two) times daily. For 10days     diphenhydrAMINE 25 MG tablet  Commonly known as:  BENADRYL  Take 25 mg by mouth every 6 (six) hours as needed for itching or allergies.     doxycycline 100 MG tablet  Commonly known as:  VIBRA-TABS  Take 1 tablet (100 mg total) by mouth 2 (two)  times daily. For 10 days     furosemide 20 MG tablet  Commonly known as:  LASIX  Take 20 mg by mouth daily as needed for edema.     insulin aspart 100 UNIT/ML injection  Commonly known as:  novoLOG  - Inject 8 Units into the skin 3 (three) times daily with meals. If CBGs <100: No Insulin  - CBGs 101-120: take 5 Units before meal if you will eat a whole meal  - CBGs  121-140:take 8 Units before meal if you will eat a whole meal  - CBGs 140-200: take 10 units before meal if you will eat a whole meal  - CBGs 201-300: take 14 units before meal if you will eat a whole meal     insulin glargine 100 UNIT/ML injection  Commonly known as:  LANTUS  Inject 0.5 mLs (50 Units total) into the skin at bedtime.     nicotine 14 mg/24hr patch  Commonly known as:  EQ NICOTINE  Place 1 patch (14 mg total) onto the skin daily.     oxyCODONE 5 MG immediate release tablet  Commonly known as:  Oxy IR/ROXICODONE  Take 1 tablet (5 mg total) by mouth every 4 (four) hours as needed for moderate pain or severe pain.       No Known Allergies     Follow-up Information   Call Garnett Farm, MD. (Call the office to make sure you get in for a wound check on Friday.)    Specialty:  Urology   Contact information:   [address] [facility]  [place] [address] [phone]       Follow up with [name], MD On 07/26/2013. (Time: 9:00am You will receive a new patient packet in the mail prior to this appt. Please fill out and bring in with you to your first appt. You may fax this information to [phone], Attention: Dr. [name] if you would like.)    Specialty:  Internal Medicine   Contact information:   [address] [place] [address] [phone]        The results of significant diagnostics from this hospitalization (including imaging, microbiology, ancillary and laboratory) are listed below for reference.    Significant  Diagnostic Studies: Ct Pelvis W Contrast  07/17/2013   CLINICAL DATA:  Scrotal swelling. Patient developed an abscess of the scrotum and underwent drainage on 07/14/2013.  EXAM: CT PELVIS WITH CONTRAST  TECHNIQUE: Multidetector CT imaging of the pelvis was performed using the standard protocol following the bolus administration of intravenous contrast.  CONTRAST:  OMNIPAQUE IOHEXOL 300 MG/ML  SOLN  COMPARISON:  07/10/2013  FINDINGS: There is persistent diffuse scrotal edema. Inferiorly, in the left hemiscrotum is a soft tissue defect which appears to have internal packing material. Gas in the scrotum at this level is consistent with the recent incision and  drainage. There is also a new 2.5 cm focus of high attenuation more cranially in the left hemiscrotum, of indeterminate significance by CT imaging. This may also be related to some packing material.  Stable appearance of the borderline inguinal lymphadenopathy bilaterally. There is slightly more skin thickening and subcutaneous edema in the suprapubic lower anterior abdominal wall.  No evidence for gas tracking up from the scrotum into the perineum.  IMPRESSION: Status post incision and drainage for left scrotal abscess. No evidence for a residual scrotal abscess at this time although there is persistent substantial scrotal edema.  Some interval increase in skin thickening and subcutaneous edema in the suprapubic lower anterior abdominal wall, but no evidence for edema or gas tracking up from the scrotum into the tissues of the perineum.   Electronically Signed   By: Kennith Center M.D.   On: 07/17/2013 15:31   Ct Pelvis W Contrast  07/10/2013   CLINICAL DATA:  Testicular pain and discoloration  EXAM: CT PELVIS WITH CONTRAST  TECHNIQUE: Multidetector CT imaging of the pelvis was performed using the standard protocol following the bolus administration of intravenous contrast.  CONTRAST:  OMNIPAQUE IOHEXOL 300 MG/ML  SOLN  COMPARISON:  07/10/2013  ultrasound  FINDINGS: Visualized intraperitoneal contents within normal limits.  Left greater than right pelvic sidewall adenopathy, measuring 2.7 cm on the left and 1.6 cm on the right. Prominent bilateral inguinal chain adenopathy, left greater than right.  Marked scrotal wall edema and subcutaneous fat stranding of the perineum. No subcutaneous gas. No significant inguinal hernia.  No acute osseous finding.  No appreciable vascular abnormality.  IMPRESSION: Scrotal wall thickening/ edema. May reflect cellulitis. Fournier's gangrene not excluded.  Enlarged pelvic and inguinal lymph nodes, most pronounced along the left pelvic sidewall. Recommend follow up to document resolution/ exclude malignancy.   Electronically Signed   By: Jearld Lesch M.D.   On: 07/10/2013 03:01   US Scrotum  07/12/2013   CLINICAL DATA:  Scrotal swelling.  EXAM: ULTRASOUND OF SCROTUM  TECHNIQUE: Complete ultrasound examination of the testicles, epididymis, and other scrotal structures was performed.  COMPARISON:  None.  FINDINGS: Right testicle  Measurements: 4.2 x 2.4 x 2.4 cm. No mass or microlithiasis visualized.  Left testicle  Measurements: 3.6 x 2.4 x 2.7 cm. No mass or microlithiasis visualized.  Right epididymis:  Normal in size and appearance.  Left epididymis:  Normal in size and appearance.  Hydrocele: Small right hydrocele is identified. There is a larger partially loculated left hydrocele.  Varicocele:  Large right varicocele.  Other.  There is diffuse edema involving the scrotum.  IMPRESSION: 1. Scrotal edema. 2. Bilateral hydroceles, left greater than right. The left hydrocele appears partially loculated. 3. Right varicocele. 4. No evidence for testicular mass or torsion.   Electronically Signed   By: Signa Kell M.D.   On: 07/12/2013 13:20   US Scrotum  07/10/2013   CLINICAL DATA:  Testicular pain and swelling  EXAM: SCROTAL ULTRASOUND  DOPPLER ULTRASOUND OF THE TESTICLES  TECHNIQUE: Complete ultrasound  examination of the testicles, epididymis, and other scrotal structures was performed. Color and spectral Doppler ultrasound were also utilized to evaluate blood flow to the testicles.  COMPARISON:  None.  FINDINGS: Right testicle  Measurements: 4.3 x 2.0 x 2.9 cm. No mass or microlithiasis visualized.  Left testicle  Measurements: 3.8 x 2.2 x 2.7 cm. No mass or microlithiasis visualized.  Right epididymis:  Normal in size and appearance.  Left epididymis:  Normal in size  and appearance.  Hydrocele:  Trace on the left.  Varicocele:  None visualized.  Pulsed Doppler interrogation of both testes demonstrates low resistance arterial and venous waveforms bilaterally.  Other:  Diffuse scrotal wall edema.  IMPRESSION: Normal sonographic appearance to the testicles. Color Doppler flow with arterial and venous waveforms documented bilaterally.  Diffuse scrotal wall edema.   Electronically Signed   By: Jearld LeschAndrew  DelGaizo M.D.   On: 07/10/2013 01:07   [place] Art/ven Flow Abd Pelv Doppler  07/10/2013   CLINICAL DATA:  Testicular pain and swelling  EXAM: SCROTAL ULTRASOUND  DOPPLER ULTRASOUND OF THE TESTICLES  TECHNIQUE: Complete ultrasound examination of the testicles, epididymis, and other scrotal structures was performed. Color and spectral Doppler ultrasound were also utilized to evaluate blood flow to the testicles.  COMPARISON:  None.  FINDINGS: Right testicle  Measurements: 4.3 x 2.0 x 2.9 cm. No mass or microlithiasis visualized.  Left testicle  Measurements: 3.8 x 2.2 x 2.7 cm. No mass or microlithiasis visualized.  Right epididymis:  Normal in size and appearance.  Left epididymis:  Normal in size and appearance.  Hydrocele:  Trace on the left.  Varicocele:  None visualized.  Pulsed Doppler interrogation of both testes demonstrates low resistance arterial and venous waveforms bilaterally.  Other:  Diffuse scrotal wall edema.  IMPRESSION: Normal sonographic appearance to the testicles. Color Doppler flow with arterial and  venous waveforms documented bilaterally.  Diffuse scrotal wall edema.   Electronically Signed   By: Jearld LeschAndrew  DelGaizo M.D.   On: 07/10/2013 01:07    Microbiology: Recent Results (from the past 240 hour(s))  WOUND CULTURE     Status: None   Collection Time    07/14/13  8:38 AM      Result Value Range Status   Specimen Description WOUND Scrotum   Final   Special Requests Immunocompromised   Final   Gram Stain     Final   Value: FEW WBC PRESENT,BOTH PMN AND MONONUCLEAR     NO SQUAMOUS EPITHELIAL CELLS SEEN     NO ORGANISMS SEEN     Performed at [facility]   Culture     Final   Value: NO GROWTH 2 DAYS     Performed at [facility]   Report Status 07/16/2013 FINAL   Final     Labs: Basic Metabolic Panel:  Recent Labs Lab 07/12/13 1415 07/13/13 0449 07/14/13 0515 07/15/13 0520 07/17/13 0455  NA 131* 133* 136* 138 143  K 3.6* 3.3* 3.4* 3.2* 3.5*  CL 97 98 100 103 107  CO2 21 22 23 25 26   GLUCOSE 374* 252* 184* 119* 129*  BUN 15 13 10 9 9   CREATININE 0.82 0.79 0.69 0.74 0.79  CALCIUM 7.6* 7.6* 7.7* 7.8* 7.8*   Liver Function Tests: No results found for this basename: AST, ALT, ALKPHOS, BILITOT, PROT, ALBUMIN,  in the last 168 hours No results found for this basename: LIPASE, AMYLASE,  in the last 168 hours No results found for this basename: AMMONIA,  in the last 168 hours CBC:  Recent Labs Lab 07/12/13 0530 07/13/13 0449 07/14/13 0515 07/17/13 0455  WBC 13.4* 11.5* 9.1 6.2  HGB 13.5 13.2 13.5 12.9*  HCT 40.8 39.7 40.0 40.1  MCV 86.6 86.5 86.2 87.9  PLT 170 193 224 257   Cardiac Enzymes: No results found for this basename: CKTOTAL, CKMB, CKMBINDEX, TROPONINI,  in the last 168 hours BNP: BNP (last 3 results) No results found for this basename: PROBNP,  in the last 8760 hours CBG:  Recent Labs Lab 07/17/13 0659 07/17/13 1141 07/17/13 1630 07/17/13 2116 07/18/13 0705  GLUCAP 110* 132* 139* 139* 96        Signed:  Sondi Desch  [facility] 07/18/2013, 1:02 PM

## 2013-07-19 LAB — GLUCOSE, CAPILLARY: Glucose-Capillary: 104 mg/dL — ABNORMAL HIGH (ref 70–99)

## 2013-08-02 ENCOUNTER — Ambulatory Visit: Payer: Medicare HMO | Admitting: Dietician

## 2013-08-04 ENCOUNTER — Encounter: Payer: Medicare HMO | Attending: Internal Medicine | Admitting: Dietician

## 2013-08-04 ENCOUNTER — Encounter: Payer: Self-pay | Admitting: Dietician

## 2013-08-04 VITALS — Ht 73.0 in | Wt >= 6400 oz

## 2013-08-04 DIAGNOSIS — IMO0002 Reserved for concepts with insufficient information to code with codable children: Secondary | ICD-10-CM

## 2013-08-04 DIAGNOSIS — E1165 Type 2 diabetes mellitus with hyperglycemia: Secondary | ICD-10-CM

## 2013-08-04 DIAGNOSIS — E119 Type 2 diabetes mellitus without complications: Secondary | ICD-10-CM | POA: Insufficient documentation

## 2013-08-04 NOTE — Progress Notes (Signed)
Appt start time: 1330 end time:  1430.  Assessment:  Patient was seen on 08/04/13 for individual diabetes education. Pt was recently hospitalized for infection, where he was diagnosed diabetic. He has made some significant changes since then, which include complete avoidance of pasta, bread, and most sweets.  Current HbA1c: 11.0  Preferred Learning Style:   No preference indicated   Learning Readiness:   Ready  MEDICATIONS: see list  DIETARY INTAKE: Usual eating pattern includes 3 meals and 1 snacks per day. Everyday foods include coffee, cereal, fruit.  Avoided foods include bread, pasta, sweets.    24-hr recall:  B ( AM): bowl of cereal (rice krispies or cheerios) with skim milk and banana. Coffee (french vanilla creamer 2-3 tbsp). May make eggs with [place] sausage in the morning. Has cut back on biscuits.  Snk ( AM): none. Cup of coffee  L ( PM): leftovers from dinner. A few kosher hot dogs (no bread). Coffee or diet ginger ale Snk ( PM): none D ( PM): sausage, peppers and onions, meatloaf, usually with a salad (lettuce/mixed greens with carrots, purple cabbage, italian dressing or [place]) and a veg. Recently been cutting back on meat portions in favor of salad or grilled veg. Says he has given up pasta, bread, sweets.  Snk ( PM): before bed will have a fruit cup in juice Beverages: coffee, diet ginger ale, water.   Pt says he has made a focus on limiting or removing starches, sweets, and cut back on meats in favor of veg to fill him.   Usual physical activity: walking throughout halls of his building- 15 min each 1-3 times per day.   Progress Towards Goal(s):  In progress.   Nutritional Diagnosis:  NI-5.8.2 Excessive carbohydrate intake As related to history of large portions of breads, pastas, and sweets.  As evidenced by HgbA1c 11.0, BMI>40.    Intervention:  Nutrition counseling provided.  Discussed diabetes disease process and treatment options.  Discussed  physiology of diabetes and role of obesity on insulin resistance.  Encouraged moderate weight reduction to improve glucose levels.  Discussed role of medications and diet in glucose control  Provided education on macronutrients on glucose levels.  Provided education on carb counting, importance of regularly scheduled meals/snacks, and meal planning  Discussed effects of physical activity on glucose levels and long-term glucose control.  Recommended 150 minutes of physical activity/week.  Reviewed patient medications.  Discussed role of medication on blood glucose and possible side effects  Discussed blood glucose monitoring and interpretation.  Discussed recommended target ranges and individual ranges.    Described short-term complications: hyper- and hypo-glycemia.  Discussed causes,symptoms, and treatment options.  Discussed prevention, detection, and treatment of long-term complications.  Discussed the role of prolonged elevated glucose levels on body systems.  Discussed role of stress on blood glucose levels and discussed strategies to manage psychosocial issues.  Discussed recommendations for long-term diabetes self-care.  Established checklist for medical, dental, and emotional self-care.  Teaching Method Utilized:  Visual Auditory  Handouts given during visit include:  Living Well with Diabetes  Best Pro, Fat, and CHO rich foods  The Plate Methods  [employer]  RD advised pt to increase exercise regimen to a goal of 60 minutes per day every day, in minimum 15 minutes increments.   Barriers to learning/adherence to lifestyle change: some food preferences, previously sedentary lifestyle, morbid obesity  Diabetes self-care support plan:   [facility] support group  Demonstrated degree of understanding via:  Teach  Back   Monitoring/Evaluation:  Dietary intake, exercise, portion control, and body weight in 2 month(s).

## 2013-08-30 ENCOUNTER — Encounter: Payer: Self-pay | Admitting: Podiatry

## 2013-08-30 ENCOUNTER — Ambulatory Visit (INDEPENDENT_AMBULATORY_CARE_PROVIDER_SITE_OTHER): Payer: Commercial Managed Care - HMO | Admitting: Podiatry

## 2013-08-30 VITALS — BP 111/51 | HR 87 | Resp 18 | Ht 73.0 in | Wt >= 6400 oz

## 2013-08-30 DIAGNOSIS — M79609 Pain in unspecified limb: Secondary | ICD-10-CM

## 2013-08-30 DIAGNOSIS — B351 Tinea unguium: Secondary | ICD-10-CM

## 2013-08-30 NOTE — Patient Instructions (Signed)
Diabetes and Foot Care Diabetes may cause you to have problems because of poor blood supply (circulation) to your feet and legs. This may cause the skin on your feet to become thinner, break easier, and heal more slowly. Your skin may become dry, and the skin may peel and crack. You may also have nerve damage in your legs and feet causing decreased feeling in them. You may not notice minor injuries to your feet that could lead to infections or more serious problems. Taking care of your feet is one of the most important things you can do for yourself.  HOME CARE INSTRUCTIONS  Wear shoes at all times, even in the house. Do not go barefoot. Bare feet are easily injured.  Check your feet daily for blisters, cuts, and redness. If you cannot see the bottom of your feet, use a mirror or ask someone for help.  Wash your feet with warm water (do not use hot water) and mild soap. Then pat your feet and the areas between your toes until they are completely dry. Do not soak your feet as this can dry your skin.  Apply a moisturizing lotion or petroleum jelly (that does not contain alcohol and is unscented) to the skin on your feet and to dry, brittle toenails. Do not apply lotion between your toes.  Trim your toenails straight across. Do not dig under them or around the cuticle. File the edges of your nails with an emery board or nail file.  Do not cut corns or calluses or try to remove them with medicine.  Wear clean socks or stockings every day. Make sure they are not too tight. Do not wear knee-high stockings since they may decrease blood flow to your legs.  Wear shoes that fit properly and have enough cushioning. To break in new shoes, wear them for just a few hours a day. This prevents you from injuring your feet. Always look in your shoes before you put them on to be sure there are no objects inside.  Do not cross your legs. This may decrease the blood flow to your feet.  If you find a minor scrape,  cut, or break in the skin on your feet, keep it and the skin around it clean and dry. These areas may be cleansed with mild soap and water. Do not cleanse the area with peroxide, alcohol, or iodine.  When you remove an adhesive bandage, be sure not to damage the skin around it.  If you have a wound, look at it several times a day to make sure it is healing.  Do not use heating pads or hot water bottles. They may burn your skin. If you have lost feeling in your feet or legs, you may not know it is happening until it is too late.  Make sure your health care provider performs a complete foot exam at least annually or more often if you have foot problems. Report any cuts, sores, or bruises to your health care provider immediately. SEEK MEDICAL CARE IF:   You have an injury that is not healing.  You have cuts or breaks in the skin.  You have an ingrown nail.  You notice redness on your legs or feet.  You feel burning or tingling in your legs or feet.  You have pain or cramps in your legs and feet.  Your legs or feet are numb.  Your feet always feel cold. SEEK IMMEDIATE MEDICAL CARE IF:   There is increasing redness,   swelling, or pain in or around a wound.  There is a red line that goes up your leg.  Pus is coming from a wound.  You develop a fever or as directed by your health care provider.  You notice a bad smell coming from an ulcer or wound. Document Released: 05/29/2000 Document Revised: 02/01/2013 Document Reviewed: 11/08/2012 [employer] Patient Information 2014 [employer]  

## 2013-08-30 NOTE — Progress Notes (Signed)
   Subjective:    Patient ID: [name], male    DOB: 12-17-1955, 58 y.o.   MRN: [id number]  HPI Comments: "Just checking the feet"  Patient states that he is diabetic and it was recommended by PCP to have feet checked. No real concerns, but he would like his toenails trimmed.      Review of Systems  Skin:       Open sores   All other systems reviewed and are negative.       Objective:   Physical Exam 58 year old white male orientated x3  Vascular: PTs 2/4 bilaterally. DP is 1/4 bilaterally  Neurological: Vibratory sensation intact bilaterally. Sensation to 10 g monofilament wire intact 3/5 right and 4/5 left. Ankle reflexes are reactive bilaterally.  Dermatological: Manson Passey hyperpigmentation skin lower legs with varicosities noted bilaterally. The toenails are elongated, brittle, discolored, incurvated and tender to palpation.  Musculoskeletal: No deformities noted. There is no restriction in the ankle, subtalar joint, midtarsal joints bilaterally.       Assessment & Plan:   Assessment: Mild sensory neuropathy bilaterally Satisfactory vascular status Venous disease bilaterally Symptomatic onychomycoses x10  Plan: Nails x10 are debrided back without a bleeding. Reappoint at three-month intervals. Information about diabetic footcare provided.

## 2013-08-31 ENCOUNTER — Encounter: Payer: Self-pay | Admitting: Podiatry

## 2013-10-06 ENCOUNTER — Encounter: Payer: Self-pay | Admitting: Dietician

## 2013-10-06 ENCOUNTER — Encounter: Payer: Medicare HMO | Attending: Internal Medicine | Admitting: Dietician

## 2013-10-06 VITALS — Wt 393.7 lb

## 2013-10-06 DIAGNOSIS — E1165 Type 2 diabetes mellitus with hyperglycemia: Secondary | ICD-10-CM

## 2013-10-06 DIAGNOSIS — IMO0002 Reserved for concepts with insufficient information to code with codable children: Secondary | ICD-10-CM

## 2013-10-06 DIAGNOSIS — E119 Type 2 diabetes mellitus without complications: Secondary | ICD-10-CM | POA: Insufficient documentation

## 2013-10-06 NOTE — Progress Notes (Signed)
A: Pt returns for F/U on type 2 DM and morbid obesity with fantastic results- nearly 30 lbs wt loss since visit here in late February. Long term goal of dropping to 350 lbs by end of calendar year.   Now walks at least 1/2 mile per day, up to 1 mile per day.   Now restricts pasta and bread, starch in general. Most meals are only proteins and vegetables- he portions mainly protein, veg, and salad. When he has sweet cravings, he mainly has fruit, fruit cups.   BG records show consistent readings in the 90-130 range.   For drinks, his focus is water, unsweetened seltzer water, coffee with sugar free and fat free creamer.   I: Continue plan of care. Increase exercise by 5 min per day q 2 weeks until 60 minutes per day is achieved. Pt is clearly well motivated, and understands methods to control cravings and reduce starch in his diet.  M/E: Monitor weight, HgbA1c, BG records, diet, exercise. F/U PRN.

## 2013-11-01 ENCOUNTER — Encounter (HOSPITAL_COMMUNITY): Payer: Medicare HMO

## 2013-11-29 ENCOUNTER — Encounter: Payer: Self-pay | Admitting: Podiatry

## 2013-11-29 ENCOUNTER — Ambulatory Visit (INDEPENDENT_AMBULATORY_CARE_PROVIDER_SITE_OTHER): Payer: Commercial Managed Care - HMO | Admitting: Podiatry

## 2013-11-29 VITALS — BP 148/79 | HR 80 | Resp 12

## 2013-11-29 DIAGNOSIS — B351 Tinea unguium: Secondary | ICD-10-CM

## 2013-11-29 DIAGNOSIS — M79609 Pain in unspecified limb: Secondary | ICD-10-CM | POA: Diagnosis not present

## 2013-11-29 NOTE — Progress Notes (Signed)
Patient ID: [name], male   DOB: 1955-09-08, 58 y.o.   MRN: [id number]  Subjective: This patient presents complaining of painful toenails  Objective: Discolored, hypertrophic, incurvated toenails x10  Assessment: Symptomatic onychomycoses x10  Plan: Nails x10 are debrided without a bleeding  Reappoint at three-month intervals

## 2013-11-30 ENCOUNTER — Other Ambulatory Visit (HOSPITAL_COMMUNITY): Payer: Self-pay | Admitting: Cardiology

## 2013-12-01 ENCOUNTER — Other Ambulatory Visit (HOSPITAL_COMMUNITY): Payer: Self-pay | Admitting: Cardiology

## 2013-12-01 DIAGNOSIS — I739 Peripheral vascular disease, unspecified: Secondary | ICD-10-CM

## 2013-12-04 ENCOUNTER — Ambulatory Visit (HOSPITAL_COMMUNITY): Payer: Medicare HMO | Attending: Geriatric Medicine | Admitting: Cardiology

## 2013-12-04 DIAGNOSIS — I739 Peripheral vascular disease, unspecified: Secondary | ICD-10-CM

## 2013-12-04 DIAGNOSIS — E785 Hyperlipidemia, unspecified: Secondary | ICD-10-CM | POA: Insufficient documentation

## 2013-12-04 DIAGNOSIS — F17200 Nicotine dependence, unspecified, uncomplicated: Secondary | ICD-10-CM | POA: Insufficient documentation

## 2013-12-04 DIAGNOSIS — L97209 Non-pressure chronic ulcer of unspecified calf with unspecified severity: Secondary | ICD-10-CM | POA: Insufficient documentation

## 2013-12-04 DIAGNOSIS — E1159 Type 2 diabetes mellitus with other circulatory complications: Secondary | ICD-10-CM | POA: Insufficient documentation

## 2013-12-04 NOTE — Progress Notes (Signed)
LEA Doppler performed 

## 2014-03-07 ENCOUNTER — Ambulatory Visit: Payer: Commercial Managed Care - HMO | Admitting: Podiatry

## 2014-03-28 ENCOUNTER — Ambulatory Visit: Payer: Commercial Managed Care - HMO | Admitting: Podiatry

## 2014-06-27 ENCOUNTER — Other Ambulatory Visit: Payer: Self-pay | Admitting: Gastroenterology

## 2014-08-13 ENCOUNTER — Other Ambulatory Visit: Payer: Self-pay | Admitting: Gastroenterology

## 2014-08-15 ENCOUNTER — Encounter (HOSPITAL_COMMUNITY): Payer: Self-pay | Admitting: *Deleted

## 2014-08-26 NOTE — Anesthesia Preprocedure Evaluation (Signed)
Anesthesia Evaluation    Reviewed: Allergy & Precautions, H&P , NPO status , Patient's Chart, lab work & pertinent test results  Airway        Dental   Pulmonary Current Smoker,          Cardiovascular negative cardio ROS      Neuro/Psych negative neurological ROS  negative psych ROS   GI/Hepatic negative GI ROS, Neg liver ROS,   Endo/Other  diabetes  Renal/GU negative Renal ROS     Musculoskeletal   Abdominal   Peds  Hematology   Anesthesia Other Findings   Reproductive/Obstetrics negative OB ROS                             Anesthesia Physical Anesthesia Plan  ASA: III  Anesthesia Plan: MAC   Post-op Pain Management:    Induction:   Airway Management Planned:   Additional Equipment:   Intra-op Plan:   Post-operative Plan:   Informed Consent:   Plan Discussed with:   Anesthesia Plan Comments:         Anesthesia Quick Evaluation

## 2014-08-27 ENCOUNTER — Ambulatory Visit (HOSPITAL_COMMUNITY)
Admission: RE | Admit: 2014-08-27 | Discharge: 2014-08-27 | Disposition: A | Discharge status: Home / Self Care | Payer: Commercial Managed Care - HMO | Source: Ambulatory Visit | Attending: Gastroenterology | Admitting: Gastroenterology

## 2014-08-27 ENCOUNTER — Ambulatory Visit (HOSPITAL_COMMUNITY): Payer: Commercial Managed Care - HMO | Admitting: Anesthesiology

## 2014-08-27 ENCOUNTER — Encounter (HOSPITAL_COMMUNITY): Payer: Self-pay

## 2014-08-27 ENCOUNTER — Ambulatory Visit (HOSPITAL_COMMUNITY): Admit: 2014-08-27 | Payer: Self-pay | Admitting: Gastroenterology

## 2014-08-27 ENCOUNTER — Encounter (HOSPITAL_COMMUNITY)
Admission: RE | Disposition: A | Discharge status: Home / Self Care | Payer: Self-pay | Source: Ambulatory Visit | Attending: Gastroenterology

## 2014-08-27 DIAGNOSIS — Z1211 Encounter for screening for malignant neoplasm of colon: Secondary | ICD-10-CM | POA: Diagnosis not present

## 2014-08-27 DIAGNOSIS — Z6841 Body Mass Index (BMI) 40.0 and over, adult: Secondary | ICD-10-CM | POA: Insufficient documentation

## 2014-08-27 DIAGNOSIS — F17210 Nicotine dependence, cigarettes, uncomplicated: Secondary | ICD-10-CM | POA: Insufficient documentation

## 2014-08-27 DIAGNOSIS — E119 Type 2 diabetes mellitus without complications: Secondary | ICD-10-CM | POA: Insufficient documentation

## 2014-08-27 DIAGNOSIS — K5730 Diverticulosis of large intestine without perforation or abscess without bleeding: Secondary | ICD-10-CM | POA: Insufficient documentation

## 2014-08-27 HISTORY — DX: Irritant contact dermatitis due friction or contact with other specified body fluids: L24.A9

## 2014-08-27 HISTORY — DX: Unspecified osteoarthritis, unspecified site: M19.90

## 2014-08-27 HISTORY — PX: FLEXIBLE SIGMOIDOSCOPY: SHX5431

## 2014-08-27 HISTORY — DX: Other injury of unspecified body region, initial encounter: T14.8XXA

## 2014-08-27 LAB — GLUCOSE, CAPILLARY: Glucose-Capillary: 84 mg/dL (ref 70–99)

## 2014-08-27 SURGERY — SIGMOIDOSCOPY, FLEXIBLE
Anesthesia: Monitor Anesthesia Care

## 2014-08-27 SURGERY — COLONOSCOPY WITH PROPOFOL
Anesthesia: Monitor Anesthesia Care

## 2014-08-27 MED ORDER — PROPOFOL 10 MG/ML IV BOLUS
INTRAVENOUS | Status: AC
  Filled 2014-08-27: qty 20

## 2014-08-27 MED ORDER — LIDOCAINE HCL (CARDIAC) 20 MG/ML IV SOLN
INTRAVENOUS | Status: AC
  Filled 2014-08-27: qty 5

## 2014-08-27 MED ORDER — KETAMINE HCL 10 MG/ML IJ SOLN
INTRAMUSCULAR | Status: DC | PRN
  Administered 2014-08-27 (×3): 10 mg via INTRAVENOUS

## 2014-08-27 MED ORDER — PROPOFOL 10 MG/ML IV BOLUS
INTRAVENOUS | Status: DC | PRN
  Administered 2014-08-27: 20 mg via INTRAVENOUS
  Administered 2014-08-27: 10 mg via INTRAVENOUS
  Administered 2014-08-27: 50 mg via INTRAVENOUS

## 2014-08-27 MED ORDER — PROPOFOL INFUSION 10 MG/ML OPTIME
INTRAVENOUS | Status: DC | PRN
  Administered 2014-08-27: 300 ug/kg/min via INTRAVENOUS

## 2014-08-27 MED ORDER — SODIUM CHLORIDE 0.9 % IV SOLN
INTRAVENOUS | Status: DC
  Administered 2014-08-27: 1000 mL via INTRAVENOUS

## 2014-08-27 MED ORDER — LACTATED RINGERS IV SOLN: INTRAVENOUS | Status: DC

## 2014-08-27 MED ORDER — METOCLOPRAMIDE HCL 5 MG/ML IJ SOLN
INTRAMUSCULAR | Status: DC | PRN
  Administered 2014-08-27: 10 mg via INTRAVENOUS

## 2014-08-27 MED ORDER — METOCLOPRAMIDE HCL 5 MG/ML IJ SOLN
INTRAMUSCULAR | Status: AC
Start: 2014-08-27 — End: 2014-08-27
  Filled 2014-08-27: qty 2

## 2014-08-27 MED ORDER — PROMETHAZINE HCL 25 MG/ML IJ SOLN: 6.2500 mg | INTRAMUSCULAR | Status: DC | PRN

## 2014-08-27 MED ORDER — LIDOCAINE HCL (CARDIAC) 20 MG/ML IV SOLN
INTRAVENOUS | Status: DC | PRN
  Administered 2014-08-27: 50 mg via INTRAVENOUS

## 2014-08-27 SURGICAL SUPPLY — 21 items

## 2014-08-27 NOTE — Anesthesia Postprocedure Evaluation (Signed)
Anesthesia Post Note  Patient: [name]  Procedure(s) Performed: Procedure(s) (LRB): COLONOSCOPY WITH PROPOFOL (N/A) FLEXIBLE SIGMOIDOSCOPY (N/A)  Anesthesia type: MAC  Patient location: PACU  Post pain: Pain level controlled  Post assessment: Patient's Cardiovascular Status Stable  Last Vitals:  Filed Vitals:   08/27/14 0835  BP: 100/47  Pulse: 78  Temp: 37 C  Resp: 17    Post vital signs: Reviewed and stable  Level of consciousness: sedated  Complications: No apparent anesthesia complications

## 2014-08-27 NOTE — Transfer of Care (Signed)
Immediate Anesthesia Transfer of Care Note  Patient: [name]  Procedure(s) Performed: Procedure(s): COLONOSCOPY WITH PROPOFOL (N/A) FLEXIBLE SIGMOIDOSCOPY (N/A)  Patient Location: Endo Recovery  Anesthesia Type:MAC  Level of Consciousness: Patient easily awoken, sedated, comfortable, cooperative, following commands, responds to stimulation.   Airway & Oxygen Therapy: Patient spontaneously breathing, ventilating well, oxygen via simple oxygen mask.  Post-op Assessment: Report given to PACU RN, vital signs reviewed and stable, moving all extremities.   Post vital signs: Reviewed and stable.  Complications: No apparent anesthesia complications

## 2014-08-27 NOTE — Op Note (Signed)
Procedure: Baseline screening colonoscopy. Unsatisfactory colon preparation  Endoscopist: Danise Edge  Premedication: Propofol administered by anesthesia  Procedure: The patient was placed in the left lateral decubitus position. Anal inspection and digital rectal exam were normal. The Pentax pediatric colonoscope was introduced into the rectum and advanced to the cecum. A normal-appearing ileocecal valve was identified. A normal-appearing appendiceal orifice was identified. Colonic preparation for the exam today was unsatisfactory to identify polyps. Withdrawal time was 18 minutes.  Rectum. Normal. Retroflexed view of the distal rectum normal  Sigmoid colon. Left colonic diverticulosis  Descending colon. Unsatisfactory preparation  Splenic flexure. Normal  Transverse colon. Unsatisfactory preparation  Hepatic flexure. Normal  Ascending colon. Unsatisfactory preparation  Cecum and ileocecal valve. Normal  Assessment:  #1. Unsatisfactory colon prep to identify colon polyps  #2. Left colonic diverticulosis  Recommendation: Reschedule screening colonoscopy

## 2014-08-27 NOTE — Discharge Instructions (Signed)

## 2014-08-27 NOTE — H&P (Signed)
Procedure: Baseline screening colonoscopy. No family history of colon cancer. BMI=47  History: The patient is a 59 year old male born 05/20/1946. He is scheduled to undergo his first screening colonoscopy with polypectomy to prevent colon cancer. He currently smokes cigarettes.  Past medical history: Morbid obesity. Hypercholesterolemia. Venous stasis dermatitis of the lower extremities. Type 2 diabetes mellitus. Drainage of a scrotal abscess.  Medication allergies: None  Exam: The patient is alert and lying comfortably on the endoscopy stretcher. Cardiac exam reveals a regular rhythm. Abdomen is soft and nontender to palpation. Lungs are clear to auscultation.  Plan: Proceed with baseline screening colonoscopy

## 2014-08-28 ENCOUNTER — Encounter (HOSPITAL_COMMUNITY): Payer: Self-pay | Admitting: Gastroenterology

## 2015-10-22 DIAGNOSIS — I8310 Varicose veins of unspecified lower extremity with inflammation: Secondary | ICD-10-CM | POA: Diagnosis not present

## 2015-10-22 DIAGNOSIS — Z0000 Encounter for general adult medical examination without abnormal findings: Secondary | ICD-10-CM | POA: Diagnosis not present

## 2015-10-22 DIAGNOSIS — Z1389 Encounter for screening for other disorder: Secondary | ICD-10-CM | POA: Diagnosis not present

## 2015-10-22 DIAGNOSIS — Z7984 Long term (current) use of oral hypoglycemic drugs: Secondary | ICD-10-CM | POA: Diagnosis not present

## 2015-10-22 DIAGNOSIS — Z6841 Body Mass Index (BMI) 40.0 and over, adult: Secondary | ICD-10-CM | POA: Diagnosis not present

## 2015-10-22 DIAGNOSIS — E7800 Pure hypercholesterolemia, unspecified: Secondary | ICD-10-CM | POA: Diagnosis not present

## 2015-10-22 DIAGNOSIS — E119 Type 2 diabetes mellitus without complications: Secondary | ICD-10-CM | POA: Diagnosis not present

## 2015-10-22 DIAGNOSIS — Z125 Encounter for screening for malignant neoplasm of prostate: Secondary | ICD-10-CM | POA: Diagnosis not present

## 2015-10-22 DIAGNOSIS — F17210 Nicotine dependence, cigarettes, uncomplicated: Secondary | ICD-10-CM | POA: Diagnosis not present

## 2016-01-20 ENCOUNTER — Other Ambulatory Visit: Payer: Self-pay | Admitting: Gastroenterology

## 2016-03-02 ENCOUNTER — Encounter (HOSPITAL_COMMUNITY): Payer: Self-pay | Admitting: *Deleted

## 2016-03-09 ENCOUNTER — Ambulatory Visit (HOSPITAL_COMMUNITY): Admission: RE | Admit: 2016-03-09 | Payer: PPO | Source: Ambulatory Visit | Admitting: Gastroenterology

## 2016-03-09 ENCOUNTER — Encounter (HOSPITAL_COMMUNITY): Admission: RE | Payer: Self-pay | Source: Ambulatory Visit

## 2016-03-09 SURGERY — COLONOSCOPY WITH PROPOFOL
Anesthesia: Monitor Anesthesia Care

## 2016-04-29 DIAGNOSIS — I8310 Varicose veins of unspecified lower extremity with inflammation: Secondary | ICD-10-CM | POA: Diagnosis not present

## 2016-04-29 DIAGNOSIS — Z6841 Body Mass Index (BMI) 40.0 and over, adult: Secondary | ICD-10-CM | POA: Diagnosis not present

## 2016-04-29 DIAGNOSIS — E119 Type 2 diabetes mellitus without complications: Secondary | ICD-10-CM | POA: Diagnosis not present

## 2016-04-29 DIAGNOSIS — Z794 Long term (current) use of insulin: Secondary | ICD-10-CM | POA: Diagnosis not present

## 2016-04-29 DIAGNOSIS — E7800 Pure hypercholesterolemia, unspecified: Secondary | ICD-10-CM | POA: Diagnosis not present

## 2016-04-29 DIAGNOSIS — F17210 Nicotine dependence, cigarettes, uncomplicated: Secondary | ICD-10-CM | POA: Diagnosis not present

## 2016-04-29 DIAGNOSIS — Z23 Encounter for immunization: Secondary | ICD-10-CM | POA: Diagnosis not present

## 2017-09-01 DIAGNOSIS — Z6841 Body Mass Index (BMI) 40.0 and over, adult: Secondary | ICD-10-CM | POA: Diagnosis not present

## 2017-09-01 DIAGNOSIS — Z7984 Long term (current) use of oral hypoglycemic drugs: Secondary | ICD-10-CM | POA: Diagnosis not present

## 2017-09-01 DIAGNOSIS — I8310 Varicose veins of unspecified lower extremity with inflammation: Secondary | ICD-10-CM | POA: Diagnosis not present

## 2017-09-01 DIAGNOSIS — E119 Type 2 diabetes mellitus without complications: Secondary | ICD-10-CM | POA: Diagnosis not present

## 2017-09-01 DIAGNOSIS — E7800 Pure hypercholesterolemia, unspecified: Secondary | ICD-10-CM | POA: Diagnosis not present

## 2017-09-01 DIAGNOSIS — F17210 Nicotine dependence, cigarettes, uncomplicated: Secondary | ICD-10-CM | POA: Diagnosis not present

## 2018-08-18 ENCOUNTER — Other Ambulatory Visit: Payer: Self-pay | Admitting: Internal Medicine

## 2018-08-18 ENCOUNTER — Other Ambulatory Visit (HOSPITAL_COMMUNITY): Payer: Self-pay | Admitting: Internal Medicine

## 2018-08-18 DIAGNOSIS — E1169 Type 2 diabetes mellitus with other specified complication: Secondary | ICD-10-CM | POA: Diagnosis not present

## 2018-08-18 DIAGNOSIS — F17210 Nicotine dependence, cigarettes, uncomplicated: Secondary | ICD-10-CM | POA: Diagnosis not present

## 2018-08-18 DIAGNOSIS — Z794 Long term (current) use of insulin: Secondary | ICD-10-CM | POA: Diagnosis not present

## 2018-08-18 DIAGNOSIS — I509 Heart failure, unspecified: Secondary | ICD-10-CM | POA: Diagnosis not present

## 2018-08-18 DIAGNOSIS — I87332 Chronic venous hypertension (idiopathic) with ulcer and inflammation of left lower extremity: Secondary | ICD-10-CM | POA: Diagnosis not present

## 2018-08-18 DIAGNOSIS — I10 Essential (primary) hypertension: Secondary | ICD-10-CM | POA: Diagnosis not present

## 2018-08-18 DIAGNOSIS — E7800 Pure hypercholesterolemia, unspecified: Secondary | ICD-10-CM | POA: Diagnosis not present

## 2018-08-18 DIAGNOSIS — L97929 Non-pressure chronic ulcer of unspecified part of left lower leg with unspecified severity: Secondary | ICD-10-CM | POA: Diagnosis not present

## 2018-09-14 ENCOUNTER — Ambulatory Visit (HOSPITAL_COMMUNITY): Payer: PPO

## 2018-10-03 DIAGNOSIS — I872 Venous insufficiency (chronic) (peripheral): Secondary | ICD-10-CM | POA: Diagnosis not present

## 2018-10-03 DIAGNOSIS — L03116 Cellulitis of left lower limb: Secondary | ICD-10-CM | POA: Diagnosis not present

## 2018-11-29 ENCOUNTER — Telehealth (HOSPITAL_COMMUNITY): Payer: Self-pay | Admitting: Cardiology

## 2018-11-29 NOTE — Telephone Encounter (Signed)
No answer, no message left

## 2018-11-30 ENCOUNTER — Other Ambulatory Visit (HOSPITAL_COMMUNITY): Payer: PPO

## 2018-12-13 ENCOUNTER — Telehealth (HOSPITAL_COMMUNITY): Payer: Self-pay

## 2018-12-13 NOTE — Telephone Encounter (Signed)

## 2018-12-13 NOTE — Telephone Encounter (Signed)
LMTCB COVID prescreening for echo. Cannot leave echo appt details due to no DPR on file. 

## 2018-12-14 ENCOUNTER — Ambulatory Visit (HOSPITAL_COMMUNITY): Payer: PPO | Attending: Cardiovascular Disease

## 2018-12-14 ENCOUNTER — Other Ambulatory Visit: Payer: Self-pay

## 2018-12-14 DIAGNOSIS — I509 Heart failure, unspecified: Secondary | ICD-10-CM

## 2018-12-20 ENCOUNTER — Encounter (HOSPITAL_BASED_OUTPATIENT_CLINIC_OR_DEPARTMENT_OTHER): Payer: PPO | Attending: Internal Medicine

## 2018-12-20 ENCOUNTER — Other Ambulatory Visit: Payer: Self-pay

## 2018-12-20 DIAGNOSIS — F17210 Nicotine dependence, cigarettes, uncomplicated: Secondary | ICD-10-CM | POA: Insufficient documentation

## 2018-12-20 DIAGNOSIS — I890 Lymphedema, not elsewhere classified: Secondary | ICD-10-CM | POA: Diagnosis not present

## 2018-12-20 DIAGNOSIS — L97822 Non-pressure chronic ulcer of other part of left lower leg with fat layer exposed: Secondary | ICD-10-CM | POA: Insufficient documentation

## 2018-12-20 DIAGNOSIS — Z794 Long term (current) use of insulin: Secondary | ICD-10-CM | POA: Diagnosis not present

## 2018-12-20 DIAGNOSIS — L97212 Non-pressure chronic ulcer of right calf with fat layer exposed: Secondary | ICD-10-CM | POA: Diagnosis not present

## 2018-12-20 DIAGNOSIS — L97222 Non-pressure chronic ulcer of left calf with fat layer exposed: Secondary | ICD-10-CM | POA: Diagnosis not present

## 2018-12-20 DIAGNOSIS — I872 Venous insufficiency (chronic) (peripheral): Secondary | ICD-10-CM | POA: Diagnosis not present

## 2018-12-20 DIAGNOSIS — E11622 Type 2 diabetes mellitus with other skin ulcer: Secondary | ICD-10-CM | POA: Insufficient documentation

## 2018-12-27 DIAGNOSIS — E11622 Type 2 diabetes mellitus with other skin ulcer: Secondary | ICD-10-CM | POA: Diagnosis not present

## 2018-12-27 DIAGNOSIS — I872 Venous insufficiency (chronic) (peripheral): Secondary | ICD-10-CM | POA: Diagnosis not present

## 2018-12-27 DIAGNOSIS — L97222 Non-pressure chronic ulcer of left calf with fat layer exposed: Secondary | ICD-10-CM | POA: Diagnosis not present

## 2018-12-27 DIAGNOSIS — L97212 Non-pressure chronic ulcer of right calf with fat layer exposed: Secondary | ICD-10-CM | POA: Diagnosis not present

## 2018-12-27 DIAGNOSIS — I890 Lymphedema, not elsewhere classified: Secondary | ICD-10-CM | POA: Diagnosis not present

## 2018-12-30 DIAGNOSIS — E11622 Type 2 diabetes mellitus with other skin ulcer: Secondary | ICD-10-CM | POA: Diagnosis not present

## 2019-01-03 DIAGNOSIS — L97212 Non-pressure chronic ulcer of right calf with fat layer exposed: Secondary | ICD-10-CM | POA: Diagnosis not present

## 2019-01-03 DIAGNOSIS — L97222 Non-pressure chronic ulcer of left calf with fat layer exposed: Secondary | ICD-10-CM | POA: Diagnosis not present

## 2019-01-03 DIAGNOSIS — E11622 Type 2 diabetes mellitus with other skin ulcer: Secondary | ICD-10-CM | POA: Diagnosis not present

## 2019-01-03 DIAGNOSIS — I872 Venous insufficiency (chronic) (peripheral): Secondary | ICD-10-CM | POA: Diagnosis not present

## 2019-01-06 DIAGNOSIS — E11622 Type 2 diabetes mellitus with other skin ulcer: Secondary | ICD-10-CM | POA: Diagnosis not present

## 2019-01-10 DIAGNOSIS — E11622 Type 2 diabetes mellitus with other skin ulcer: Secondary | ICD-10-CM | POA: Diagnosis not present

## 2019-01-10 DIAGNOSIS — I872 Venous insufficiency (chronic) (peripheral): Secondary | ICD-10-CM | POA: Diagnosis not present

## 2019-01-10 DIAGNOSIS — I890 Lymphedema, not elsewhere classified: Secondary | ICD-10-CM | POA: Diagnosis not present

## 2019-01-10 DIAGNOSIS — L97212 Non-pressure chronic ulcer of right calf with fat layer exposed: Secondary | ICD-10-CM | POA: Diagnosis not present

## 2019-01-10 DIAGNOSIS — L97222 Non-pressure chronic ulcer of left calf with fat layer exposed: Secondary | ICD-10-CM | POA: Diagnosis not present

## 2019-01-13 DIAGNOSIS — E11622 Type 2 diabetes mellitus with other skin ulcer: Secondary | ICD-10-CM | POA: Diagnosis not present

## 2019-01-17 ENCOUNTER — Other Ambulatory Visit: Payer: Self-pay

## 2019-01-17 ENCOUNTER — Encounter (HOSPITAL_BASED_OUTPATIENT_CLINIC_OR_DEPARTMENT_OTHER): Payer: PPO | Attending: Internal Medicine

## 2019-01-17 DIAGNOSIS — I110 Hypertensive heart disease with heart failure: Secondary | ICD-10-CM | POA: Diagnosis not present

## 2019-01-17 DIAGNOSIS — L97822 Non-pressure chronic ulcer of other part of left lower leg with fat layer exposed: Secondary | ICD-10-CM | POA: Diagnosis not present

## 2019-01-17 DIAGNOSIS — L03115 Cellulitis of right lower limb: Secondary | ICD-10-CM | POA: Diagnosis not present

## 2019-01-17 DIAGNOSIS — L97812 Non-pressure chronic ulcer of other part of right lower leg with fat layer exposed: Secondary | ICD-10-CM | POA: Diagnosis not present

## 2019-01-17 DIAGNOSIS — I890 Lymphedema, not elsewhere classified: Secondary | ICD-10-CM | POA: Insufficient documentation

## 2019-01-17 DIAGNOSIS — I509 Heart failure, unspecified: Secondary | ICD-10-CM | POA: Diagnosis not present

## 2019-01-17 DIAGNOSIS — I872 Venous insufficiency (chronic) (peripheral): Secondary | ICD-10-CM | POA: Diagnosis not present

## 2019-01-17 DIAGNOSIS — E119 Type 2 diabetes mellitus without complications: Secondary | ICD-10-CM | POA: Diagnosis not present

## 2019-01-17 DIAGNOSIS — L97222 Non-pressure chronic ulcer of left calf with fat layer exposed: Secondary | ICD-10-CM | POA: Diagnosis not present

## 2019-01-17 DIAGNOSIS — L97212 Non-pressure chronic ulcer of right calf with fat layer exposed: Secondary | ICD-10-CM | POA: Diagnosis not present

## 2019-01-20 DIAGNOSIS — L97822 Non-pressure chronic ulcer of other part of left lower leg with fat layer exposed: Secondary | ICD-10-CM | POA: Diagnosis not present

## 2019-01-24 ENCOUNTER — Other Ambulatory Visit (HOSPITAL_COMMUNITY)
Admission: RE | Admit: 2019-01-24 | Discharge: 2019-01-24 | Disposition: A | Discharge status: Home / Self Care | Payer: PPO | Source: Other Acute Inpatient Hospital | Attending: Internal Medicine | Admitting: Internal Medicine

## 2019-01-24 DIAGNOSIS — E119 Type 2 diabetes mellitus without complications: Secondary | ICD-10-CM | POA: Insufficient documentation

## 2019-01-24 DIAGNOSIS — L97222 Non-pressure chronic ulcer of left calf with fat layer exposed: Secondary | ICD-10-CM | POA: Diagnosis not present

## 2019-01-24 DIAGNOSIS — L97212 Non-pressure chronic ulcer of right calf with fat layer exposed: Secondary | ICD-10-CM | POA: Diagnosis not present

## 2019-01-24 DIAGNOSIS — I872 Venous insufficiency (chronic) (peripheral): Secondary | ICD-10-CM | POA: Diagnosis not present

## 2019-01-24 DIAGNOSIS — L97822 Non-pressure chronic ulcer of other part of left lower leg with fat layer exposed: Secondary | ICD-10-CM | POA: Diagnosis not present

## 2019-01-27 DIAGNOSIS — L97822 Non-pressure chronic ulcer of other part of left lower leg with fat layer exposed: Secondary | ICD-10-CM | POA: Diagnosis not present

## 2019-01-28 LAB — AEROBIC CULTURE W GRAM STAIN (SUPERFICIAL SPECIMEN): Gram Stain: NONE SEEN

## 2019-01-28 LAB — AEROBIC CULTURE? (SUPERFICIAL SPECIMEN)

## 2019-01-31 DIAGNOSIS — L97212 Non-pressure chronic ulcer of right calf with fat layer exposed: Secondary | ICD-10-CM | POA: Diagnosis not present

## 2019-01-31 DIAGNOSIS — I872 Venous insufficiency (chronic) (peripheral): Secondary | ICD-10-CM | POA: Diagnosis not present

## 2019-01-31 DIAGNOSIS — I890 Lymphedema, not elsewhere classified: Secondary | ICD-10-CM | POA: Diagnosis not present

## 2019-01-31 DIAGNOSIS — L97822 Non-pressure chronic ulcer of other part of left lower leg with fat layer exposed: Secondary | ICD-10-CM | POA: Diagnosis not present

## 2019-01-31 DIAGNOSIS — L97222 Non-pressure chronic ulcer of left calf with fat layer exposed: Secondary | ICD-10-CM | POA: Diagnosis not present

## 2019-02-03 DIAGNOSIS — L97822 Non-pressure chronic ulcer of other part of left lower leg with fat layer exposed: Secondary | ICD-10-CM | POA: Diagnosis not present

## 2019-02-07 DIAGNOSIS — L97822 Non-pressure chronic ulcer of other part of left lower leg with fat layer exposed: Secondary | ICD-10-CM | POA: Diagnosis not present

## 2019-02-07 DIAGNOSIS — I872 Venous insufficiency (chronic) (peripheral): Secondary | ICD-10-CM | POA: Diagnosis not present

## 2019-02-07 DIAGNOSIS — L97212 Non-pressure chronic ulcer of right calf with fat layer exposed: Secondary | ICD-10-CM | POA: Diagnosis not present

## 2019-02-10 DIAGNOSIS — L97822 Non-pressure chronic ulcer of other part of left lower leg with fat layer exposed: Secondary | ICD-10-CM | POA: Diagnosis not present

## 2019-02-14 ENCOUNTER — Other Ambulatory Visit: Payer: Self-pay

## 2019-02-14 ENCOUNTER — Encounter (HOSPITAL_BASED_OUTPATIENT_CLINIC_OR_DEPARTMENT_OTHER): Payer: PPO | Attending: Internal Medicine

## 2019-02-14 DIAGNOSIS — I872 Venous insufficiency (chronic) (peripheral): Secondary | ICD-10-CM | POA: Diagnosis not present

## 2019-02-14 DIAGNOSIS — I509 Heart failure, unspecified: Secondary | ICD-10-CM | POA: Diagnosis not present

## 2019-02-14 DIAGNOSIS — E11622 Type 2 diabetes mellitus with other skin ulcer: Secondary | ICD-10-CM | POA: Diagnosis not present

## 2019-02-14 DIAGNOSIS — L97822 Non-pressure chronic ulcer of other part of left lower leg with fat layer exposed: Secondary | ICD-10-CM | POA: Insufficient documentation

## 2019-02-14 DIAGNOSIS — E1151 Type 2 diabetes mellitus with diabetic peripheral angiopathy without gangrene: Secondary | ICD-10-CM | POA: Diagnosis not present

## 2019-02-14 DIAGNOSIS — L97812 Non-pressure chronic ulcer of other part of right lower leg with fat layer exposed: Secondary | ICD-10-CM | POA: Insufficient documentation

## 2019-02-14 DIAGNOSIS — I110 Hypertensive heart disease with heart failure: Secondary | ICD-10-CM | POA: Diagnosis not present

## 2019-02-14 DIAGNOSIS — L97212 Non-pressure chronic ulcer of right calf with fat layer exposed: Secondary | ICD-10-CM | POA: Diagnosis not present

## 2019-02-14 DIAGNOSIS — L97222 Non-pressure chronic ulcer of left calf with fat layer exposed: Secondary | ICD-10-CM | POA: Diagnosis not present

## 2019-02-14 DIAGNOSIS — I890 Lymphedema, not elsewhere classified: Secondary | ICD-10-CM | POA: Insufficient documentation

## 2019-02-17 DIAGNOSIS — E11622 Type 2 diabetes mellitus with other skin ulcer: Secondary | ICD-10-CM | POA: Diagnosis not present

## 2019-02-21 DIAGNOSIS — E11622 Type 2 diabetes mellitus with other skin ulcer: Secondary | ICD-10-CM | POA: Diagnosis not present

## 2019-02-23 DIAGNOSIS — I872 Venous insufficiency (chronic) (peripheral): Secondary | ICD-10-CM | POA: Diagnosis not present

## 2019-02-23 DIAGNOSIS — L97219 Non-pressure chronic ulcer of right calf with unspecified severity: Secondary | ICD-10-CM | POA: Diagnosis not present

## 2019-02-23 DIAGNOSIS — R600 Localized edema: Secondary | ICD-10-CM | POA: Diagnosis not present

## 2019-02-23 DIAGNOSIS — L97229 Non-pressure chronic ulcer of left calf with unspecified severity: Secondary | ICD-10-CM | POA: Diagnosis not present

## 2019-02-23 DIAGNOSIS — E11622 Type 2 diabetes mellitus with other skin ulcer: Secondary | ICD-10-CM | POA: Diagnosis not present

## 2019-02-28 DIAGNOSIS — E11622 Type 2 diabetes mellitus with other skin ulcer: Secondary | ICD-10-CM | POA: Diagnosis not present

## 2019-03-03 DIAGNOSIS — L97212 Non-pressure chronic ulcer of right calf with fat layer exposed: Secondary | ICD-10-CM | POA: Diagnosis not present

## 2019-03-03 DIAGNOSIS — L97222 Non-pressure chronic ulcer of left calf with fat layer exposed: Secondary | ICD-10-CM | POA: Diagnosis not present

## 2019-03-03 DIAGNOSIS — R600 Localized edema: Secondary | ICD-10-CM | POA: Diagnosis not present

## 2019-03-03 DIAGNOSIS — I872 Venous insufficiency (chronic) (peripheral): Secondary | ICD-10-CM | POA: Diagnosis not present

## 2019-03-03 DIAGNOSIS — E11622 Type 2 diabetes mellitus with other skin ulcer: Secondary | ICD-10-CM | POA: Diagnosis not present

## 2019-03-07 DIAGNOSIS — E11622 Type 2 diabetes mellitus with other skin ulcer: Secondary | ICD-10-CM | POA: Diagnosis not present

## 2019-03-10 DIAGNOSIS — I872 Venous insufficiency (chronic) (peripheral): Secondary | ICD-10-CM | POA: Diagnosis not present

## 2019-03-10 DIAGNOSIS — L97212 Non-pressure chronic ulcer of right calf with fat layer exposed: Secondary | ICD-10-CM | POA: Diagnosis not present

## 2019-03-10 DIAGNOSIS — L97222 Non-pressure chronic ulcer of left calf with fat layer exposed: Secondary | ICD-10-CM | POA: Diagnosis not present

## 2019-03-10 DIAGNOSIS — E11622 Type 2 diabetes mellitus with other skin ulcer: Secondary | ICD-10-CM | POA: Diagnosis not present

## 2019-03-10 DIAGNOSIS — L97812 Non-pressure chronic ulcer of other part of right lower leg with fat layer exposed: Secondary | ICD-10-CM | POA: Diagnosis not present

## 2019-03-14 DIAGNOSIS — E11622 Type 2 diabetes mellitus with other skin ulcer: Secondary | ICD-10-CM | POA: Diagnosis not present

## 2019-03-16 DIAGNOSIS — I87311 Chronic venous hypertension (idiopathic) with ulcer of right lower extremity: Secondary | ICD-10-CM | POA: Diagnosis not present

## 2019-03-17 ENCOUNTER — Other Ambulatory Visit: Payer: Self-pay

## 2019-03-17 ENCOUNTER — Encounter (HOSPITAL_BASED_OUTPATIENT_CLINIC_OR_DEPARTMENT_OTHER): Payer: PPO | Attending: Internal Medicine | Admitting: Internal Medicine

## 2019-03-17 DIAGNOSIS — I509 Heart failure, unspecified: Secondary | ICD-10-CM | POA: Insufficient documentation

## 2019-03-17 DIAGNOSIS — L97212 Non-pressure chronic ulcer of right calf with fat layer exposed: Secondary | ICD-10-CM | POA: Diagnosis not present

## 2019-03-17 DIAGNOSIS — I890 Lymphedema, not elsewhere classified: Secondary | ICD-10-CM | POA: Insufficient documentation

## 2019-03-17 DIAGNOSIS — E11622 Type 2 diabetes mellitus with other skin ulcer: Secondary | ICD-10-CM | POA: Diagnosis not present

## 2019-03-17 DIAGNOSIS — L97222 Non-pressure chronic ulcer of left calf with fat layer exposed: Secondary | ICD-10-CM | POA: Diagnosis not present

## 2019-03-17 DIAGNOSIS — L97822 Non-pressure chronic ulcer of other part of left lower leg with fat layer exposed: Secondary | ICD-10-CM | POA: Diagnosis not present

## 2019-03-17 DIAGNOSIS — I110 Hypertensive heart disease with heart failure: Secondary | ICD-10-CM | POA: Insufficient documentation

## 2019-03-17 DIAGNOSIS — I872 Venous insufficiency (chronic) (peripheral): Secondary | ICD-10-CM | POA: Diagnosis not present

## 2019-03-17 DIAGNOSIS — L97819 Non-pressure chronic ulcer of other part of right lower leg with unspecified severity: Secondary | ICD-10-CM | POA: Diagnosis not present

## 2019-03-17 NOTE — Progress Notes (Signed)
ALIXANDER, RALLIS ([id number]) Visit Report for 03/17/2019 Multi-Disciplinary Care Plan Details Patient Name: Date of Service: ALARIK, RADU 03/17/2019 8:00 AM Medical Record [id number] Patient Account Number: [id number] Date of Birth/Sex: Treating RN: 10-28-1955 (63 y.o. Ernestene Mention Primary Care Dozier Berkovich: Kandice Hams Other Clinician: Referring Janitza Revuelta: Treating Makailey Hodgkin/Extender:Robson, Alice Reichert, Lowella Dandy in Treatment: 0 Active Inactive Venous Leg Ulcer Nursing Diagnoses: Knowledge deficit related to disease process and management Potential for venous Insuffiency (use before diagnosis confirmed) Goals: Patient will maintain optimal edema control Date Initiated: 03/17/2019 Target Resolution Date: 04/14/2019 Goal Status: Active Interventions: Assess peripheral edema status every visit. Compression as ordered Treatment Activities: Therapeutic compression applied : 03/17/2019 Notes: Wound/Skin Impairment Nursing Diagnoses: Impaired tissue integrity Knowledge deficit related to ulceration/compromised skin integrity Goals: Patient/caregiver will verbalize understanding of skin care regimen Date Initiated: 03/17/2019 Target Resolution Date: 04/14/2019 Goal Status: Active Ulcer/skin breakdown will heal within 14 weeks Date Initiated: 03/17/2019 Target Resolution Date: 03/31/2019 Goal Status: Active Interventions: Assess patient/caregiver ability to obtain necessary supplies Assess patient/caregiver ability to perform ulcer/skin care regimen upon admission and as needed Assess ulceration(s) every visit Treatment Activities: Skin care regimen initiated : 03/17/2019 Topical wound management initiated : 03/17/2019 Notes: Electronic Signature(s) Signed: 03/17/2019 7:05:19 PM By: Baruch Gouty RN, [name] Entered By: Baruch Gouty on 03/17/2019 08:14:27 -------------------------------------------------------------------------------- Patient/Caregiver  Education Details Patient Name: Date of Service: [name] 10/2/2020andnbsp8:00 AM Medical Record [id number] Patient Account Number: [id number] Date of Birth/Gender: Treating RN: [birth year] (63 y.o. Ernestene Mention Primary Care Physician: Kandice Hams Other Clinician: Referring Physician: Treating Physician/Extender:Robson, Alice Reichert, Lowella Dandy in Treatment: 0 Education Assessment Education Provided To: Patient Education Topics Provided Venous: Methods: Explain/Verbal Responses: Reinforcements needed, State content correctly Wound/Skin Impairment: Methods: Explain/Verbal Responses: Reinforcements needed, State content correctly Electronic Signature(s) Signed: 03/17/2019 7:05:19 PM By: Baruch Gouty RN, [name] Entered By: Baruch Gouty on 03/17/2019 [place]

## 2019-03-17 NOTE — Progress Notes (Signed)
[name], Matthew J. ([id number]) Visit Report for 03/17/2019 HPI Details Patient Name: Date of Service: [name], Matthew J. 03/17/2019 8:00 AM Medical Record [id number] Patient Account Number: Date of Birth/Sex: Treating RN: 09/26/1955 (62 y.o. Male) Zenaida DeedBoehlein, Linda Primary Care Provider: [name], [name] Other Clinician: Referring Provider: Treating Provider/Extender:Daaiyah Baumert, Almira CoasterMichael Polite, [name] in Treatment: 12 History of Present Illness HPI Description: 63 year old male with several years history of bilateral lower extremity lymphedema, apparently had been established with wound clinic in 2008, now presents with worsening lower extremity ulcers on both sides. Patient was seen in April by his primary care physician and was prescribed a course of Bactrim for right leg ulcer with cellulitis, he has not been under any form of compression recently. Since April he has noticed gradual worsening of these wounds on both legs. He denies any significant pain, he has been using pads and dressing from over-the-counter purchase to keep the drainage down and he has noticed this has been increasing over the past couple of weeks. He denies any systemic symptoms of fevers chills or shakes. He continues to smoke a pack a day. He does not have a diagnosis of sleep apnea or has never been tested for it. Patient has history of congestive heart failure although latest echo shows preserved ejection fraction and normal chambers, type 2 diabetes on insulin and last known ABIs prior to the clinic in 2015 that were normal ABIs today in the clinic are 1.43 on the left and 1.22 on the right 7/14; patient admitted to the clinic last week. He has longstanding wounds on the left leg about a year and on the right leg for the last 6 months. The area on the left is almost circumferential. He has lymphedema changes of chronic venous insufficiency. Comes in today with leaking edema fluid extremely malodorous. He  is going to need to come in for a nurse change on Friday. 7/21; substantial wounds on the left leg and right lateral leg. All of these covered in adherent surface slough. We have been using silver alginate under 4-layer compression 7/28 substantial wounds on the left greater than right leg. The left is on the medial and posterior right on the lateral. Severe chronic venous insufficiency with secondary lymphedema. The patient states he has a history of wounds with the last one was 3 years ago. He does not wear stockings 8/4-Patient returns at 1 week, wounds are slightly improved than last time, we are continuing 4 layer compression 8/11-Patient returns at 1 week after being in 4 layer compression continues to have significant drainage on the right and apparently malodorous drainage on the left from the 2 wounds. We are using silver alginate 8/18; 4 layer compression for large wounds on the right lateral and left medial calfs in the setting of chronic venous insufficiency and secondary lymphedema. A culture was done last week that showed both Proteus and MSSA. I prescribed Augmentin twice daily for 7 days. This will cover any on cultured anaerobes there is apparently a very significant odor last week with purulent looking drainage 8/25; wounds have some surface debris but not much overall. The area on the left measures smaller. There are islands of epithelialization on the right lateral. Been using silver alginate. He is completing his Augmentin 9/1; surface area of the wounds is down, wound surfaces look excellent using Hydrofera Blue under 4-layer compression 9/10-Patient returns at 1 week with both legs in 4 layer compression wraps with Hydrofera Blue. The wound surfaces are continuing to improve  with a fair amount of exudate from the surfaces as expected 9/18; continue with Hydrofera Blue under 4-layer compression making good improvements in both wound areas. We are going to order him  bilateral juxta lites. With regards to the compression pumps he did not seem to enamored about the possible cost of this as he does not have a secondary insurance 9/25; the patient has a small wound on the right lateral and left posterior calf. Comes in today with considerable amount of edema in the right calf is wrapped fell down. There is also edema in the left mid calf. Even with our 4- layer compression we really do not have good edema control 10/2; small wound on the right is closed over. May be some non-completely epithelialized areas on the back of the right calf. Left posterior calf not much change although surface looks good. We do not have perfect edema control using Hydrofera Blue Were having issues with external compression garment ordering. Compression pumps are in the works. He will definitely need the latter Electronic Signature(s) Signed: 03/17/2019 6:19:43 PM By: Baltazar Najjar MD Entered By: Baltazar Najjar on 03/17/2019 09:20:42 -------------------------------------------------------------------------------- Physical Exam Details Patient Name: Date of Service: [name], [name] 03/17/2019 8:00 AM Medical Record [id number] Patient Account Number: Date of Birth/Sex: Treating RN: 1955-10-08 (62 y.o. Male) Zenaida Deed Primary Care Provider: [name] Other Clinician: Referring Provider: Treating Provider/Extender:Alonza Knisley, [name], [name] in Treatment: 12 Constitutional Patient is hypertensive.. Pulse regular and within target range for patient.Marland Kitchen Respirations regular, non-labored and within target range.. Temperature is normal and within the target range for the patient.Marland Kitchen Appears in no distress. Eyes Conjunctivae clear. No discharge.no icterus. Respiratory work of breathing is normal. Cardiovascular Pedal pulses palpable and strong bilaterally.. Edema present in both extremities. Nonpitting. We especially do not have good edema control on  the left leg. Integumentary (Hair, Skin) Severe bilateral hemosiderin deposition. Psychiatric appears at normal baseline. Electronic Signature(s) Signed: 03/17/2019 6:19:43 PM By: Baltazar Najjar MD Entered By: Baltazar Najjar on 03/17/2019 09:23:56 -------------------------------------------------------------------------------- Physician Orders Details Patient Name: Date of Service: [name], [name] 03/17/2019 8:00 AM Medical Record [id number] Patient Account Number: Date of Birth/Sex: Treating RN: 11-Aug-1955 (62 y.o. Male) Zenaida Deed Primary Care Provider: [name] Other Clinician: Referring Provider: Treating Provider/Extender:Savannha Welle, [name], [name] in Treatment: 12 Verbal / Phone Orders: No Diagnosis Coding ICD-10 Coding Code Description [phone] Non-pressure chronic ulcer of right calf with fat layer exposed L97.222 Non-pressure chronic ulcer of left calf with fat layer exposed I89.0 Lymphedema, not elsewhere classified E11.9 Type 2 diabetes mellitus without complications L03.115 Cellulitis of right lower limb Follow-up Appointments Return Appointment in 1 week. - Friday Nurse Visit: - Tuesday for rewrap if needed Dressing Change Frequency Wound #4 Left,Circumferential Lower Leg Do not change entire dressing for one week. Wound #5 Right,Lateral Lower Leg Do not change entire dressing for one week. Skin Barriers/Peri-Wound Care Barrier cream - as needed for maceration, wetness, or redness. Moisturizing lotion Wound Cleansing May shower with protection. Primary Wound Dressing Wound #4 Left,Circumferential Lower Leg Hydrofera Blue Wound #5 Right,Lateral Lower Leg Hydrofera Blue Secondary Dressing Wound #4 Left,Circumferential Lower Leg Dry Gauze Wound #5 Right,Lateral Lower Leg Dry Gauze ABD pad Edema Control 4 layer compression - Bilateral - use unna layer at top of calves to secure Avoid standing for long periods of  time Elevate legs to the level of the heart or above for 30 minutes daily and/or when sitting, a frequency of: - throughout  the day. Exercise regularly Segmental Compressive Device. - lymphadema pumps 60 minutes 2 times per day when available Electronic Signature(s) Signed: 03/17/2019 6:19:43 PM By: Baltazar Najjarobson, Teila Skalsky MD Signed: 03/17/2019 7:05:19 PM By: Zenaida DeedBoehlein, Linda RN, [name] Entered By: Zenaida DeedBoehlein, Linda on 03/17/2019 09:03:48 -------------------------------------------------------------------------------- Problem List Details Patient Name: Date of Service: [name], Matthew J. 03/17/2019 8:00 AM Medical Record [id number] Patient Account Number: Date of Birth/Sex: Treating RN: 02/20/1956 (62 y.o. Male) Zenaida DeedBoehlein, Linda Primary Care Provider: [name], [name] Other Clinician: Referring Provider: Treating Provider/Extender:[place] Scheiderer, Almira CoasterMichael Polite, [name] in Treatment: 12 Active Problems ICD-10 Evaluated Encounter Code Description Active Date Today Diagnosis L97.212 Non-pressure chronic ulcer of right calf with fat layer 12/20/2018 No Yes exposed L97.222 Non-pressure chronic ulcer of left calf with fat layer 12/20/2018 No Yes exposed I89.0 Lymphedema, not elsewhere classified 12/20/2018 No Yes E11.9 Type 2 diabetes mellitus without complications 12/20/2018 No Yes Inactive Problems ICD-10 Code Description Active Date Inactive Date L03.115 Cellulitis of right lower limb 01/31/2019 01/31/2019 Resolved Problems Electronic Signature(s) Signed: 03/17/2019 6:19:43 PM By: Baltazar Najjarobson, Jannifer Fischler MD Entered By: Baltazar Najjarobson, Braxtin Bamba on 03/17/2019 09:19:10 -------------------------------------------------------------------------------- Progress Note Details Patient Name: Date of Service: [name], Matthew J. 03/17/2019 8:00 AM Medical Record [id number] Patient Account Number: Date of Birth/Sex: Treating RN: 04/20/1956 (62 y.o. Male) Zenaida DeedBoehlein, Linda Primary Care Provider: [name], [name] Other  Clinician: Referring Provider: Treating Provider/Extender:Karinda Cabriales, Almira CoasterMichael Polite, Deirdre Peeronald D Weeks in Treatment: 12 Subjective History of Present Illness (HPI) 63 year old male with several years history of bilateral lower extremity lymphedema, apparently had been established with wound clinic in 2008, now presents with worsening lower extremity ulcers on both sides. Patient was seen in April by his primary care physician and was prescribed a course of Bactrim for right leg ulcer with cellulitis, he has not been under any form of compression recently. Since April he has noticed gradual worsening of these wounds on both legs. He denies any significant pain, he has been using pads and dressing from over-the- counter purchase to keep the drainage down and he has noticed this has been increasing over the past couple of weeks. He denies any systemic symptoms of fevers chills or shakes. He continues to smoke a pack a day. He does not have a diagnosis of sleep apnea or has never been tested for it. Patient has history of congestive heart failure although latest echo shows preserved ejection fraction and normal chambers, type 2 diabetes on insulin and last known ABIs prior to the clinic in 2015 that were normal ABIs today in the clinic are 1.43 on the left and 1.22 on the right 7/14; patient admitted to the clinic last week. He has longstanding wounds on the left leg about a year and on the right leg for the last 6 months. The area on the left is almost circumferential. He has lymphedema changes of chronic venous insufficiency. Comes in today with leaking edema fluid extremely malodorous. He is going to need to come in for a nurse change on Friday. 7/21; substantial wounds on the left leg and right lateral leg. All of these covered in adherent surface slough. We have been using silver alginate under 4-layer compression 7/28 substantial wounds on the left greater than right leg. The left is on the medial  and posterior right on the lateral. Severe chronic venous insufficiency with secondary lymphedema. The patient states he has a history of wounds with the last one was 3 years ago. He does not wear stockings 8/4-Patient returns at 1 week, wounds are slightly improved  than last time, we are continuing 4 layer compression 8/11-Patient returns at 1 week after being in 4 layer compression continues to have significant drainage on the right and apparently malodorous drainage on the left from the 2 wounds. We are using silver alginate 8/18; 4 layer compression for large wounds on the right lateral and left medial calfs in the setting of chronic venous insufficiency and secondary lymphedema. A culture was done last week that showed both Proteus and MSSA. I prescribed Augmentin twice daily for 7 days. This will cover any on cultured anaerobes there is apparently a very significant odor last week with purulent looking drainage 8/25; wounds have some surface debris but not much overall. The area on the left measures smaller. There are islands of epithelialization on the right lateral. Been using silver alginate. He is completing his Augmentin 9/1; surface area of the wounds is down, wound surfaces look excellent using Hydrofera Blue under 4-layer compression 9/10-Patient returns at 1 week with both legs in 4 layer compression wraps with Hydrofera Blue. The wound surfaces are continuing to improve with a fair amount of exudate from the surfaces as expected 9/18; continue with Hydrofera Blue under 4-layer compression making good improvements in both wound areas. We are going to order him bilateral juxta lites. With regards to the compression pumps he did not seem to enamored about the possible cost of this as he does not have a secondary insurance 9/25; the patient has a small wound on the right lateral and left posterior calf. Comes in today with considerable amount of edema in the right calf is wrapped fell  down. There is also edema in the left mid calf. Even with our 4- layer compression we really do not have good edema control 10/2; small wound on the right is closed over. May be some non-completely epithelialized areas on the back of the right calf. Left posterior calf not much change although surface looks good. We do not have perfect edema control using Hydrofera Blue Were having issues with external compression garment ordering. Compression pumps are in the works. He will definitely need the latter Objective Constitutional Patient is hypertensive.. Pulse regular and within target range for patient.Marland Kitchen Respirations regular, non-labored and within target range.. Temperature is normal and within the target range for the patient.Marland Kitchen Appears in no distress. Vitals Time Taken: 8:00 AM, Height: 73 in, Weight: 425 lbs, BMI: 56.1, Temperature: 98 F, Pulse: 89 bpm, Respiratory Rate: 16 breaths/min, Blood Pressure: 153/86 mmHg, Capillary Blood Glucose: 126 mg/dl. Eyes Conjunctivae clear. No discharge.no icterus. Respiratory work of breathing is normal. Cardiovascular Pedal pulses palpable and strong bilaterally.. Edema present in both extremities. Nonpitting. We especially do not have good edema control on the left leg. Psychiatric appears at normal baseline. Integumentary (Hair, Skin) Severe bilateral hemosiderin deposition. Wound #4 status is Open. Original cause of wound was Gradually Appeared. The wound is located on the Left,Circumferential Lower Leg. The wound measures 7.2cm length x 4.5cm width x 0.1cm depth; 25.447cm^2 area and 2.545cm^3 volume. There is Fat Layer (Subcutaneous Tissue) Exposed exposed. There is no tunneling or undermining noted. There is a medium amount of serosanguineous drainage noted. The wound margin is distinct with the outline attached to the wound base. There is large (67-100%) red, pink granulation within the wound bed. There is a small (1-33%) amount of necrotic  tissue within the wound bed including Adherent Slough. Wound #5 status is Open. Original cause of wound was Gradually Appeared. The wound is located on the Right,Lateral  Lower Leg. The wound measures 0.5cm length x 0.5cm width x 0.1cm depth; 0.196cm^2 area and 0.02cm^3 volume. There is Fat Layer (Subcutaneous Tissue) Exposed exposed. There is no tunneling or undermining noted. There is a small amount of serosanguineous drainage noted. The wound margin is distinct with the outline attached to the wound base. There is large (67-100%) red, pink granulation within the wound bed. There is no necrotic tissue within the wound bed. Wound #6 status is Healed - Epithelialized. Original cause of wound was Gradually Appeared. The wound is located on the Right,Proximal,Lateral Lower Leg. The wound measures 0cm length x 0cm width x 0cm depth; 0cm^2 area and 0cm^3 volume. 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 There was a Four Layer Compression Therapy Procedure by Shawn Stall, RN. Post procedure Diagnosis Wound #4: Same as Pre-Procedure Wound #5 Pre-procedure diagnosis of Wound #5 is a Venous Leg Ulcer located on the Right,Lateral Lower Leg . There was a Four Layer Compression Therapy Procedure by Shawn Stall, RN. Post procedure Diagnosis Wound #5: Same as Pre-Procedure Plan Follow-up Appointments: Return Appointment in 1 week. - Friday Nurse Visit: - Tuesday for rewrap if needed Dressing Change Frequency: Wound #4 Left,Circumferential Lower Leg: Do not change entire dressing for one week. Wound #5 Right,Lateral Lower Leg: Do not change entire dressing for one  week. Skin Barriers/Peri-Wound Care: Barrier cream - as needed for maceration, wetness, or redness. Moisturizing lotion Wound Cleansing: May shower with protection. Primary Wound Dressing: Wound #4 Left,Circumferential Lower Leg: Hydrofera Blue Wound #5 Right,Lateral Lower Leg: Hydrofera Blue Secondary Dressing: Wound #4 Left,Circumferential Lower Leg: Dry Gauze Wound #5 Right,Lateral Lower Leg: Dry Gauze ABD pad Edema Control: 4 layer compression - Bilateral - use unna layer at top of calves to secure Avoid standing for long periods of time Elevate legs to the level of the heart or above for 30 minutes daily and/or when sitting, a frequency of: - throughout the day. Exercise regularly Segmental Compressive Device. - lymphadema pumps 60 minutes 2 times per day when available 1. Hydrofera Blue is the primary dressing 2. 4 layer compression bilaterally 3. We especially do not have good edema control even with 4-layer compression on the left. There is no evidence of infection however and no evidence of a DVT 4. He will definitely require external compression pumps. I am doubtful that juxta lites will maintain this by themselves 5. He may need a lymphedema clinic. I would like to have the wounds healed Electronic Signature(s) Signed: 03/17/2019 6:19:43 PM By: Baltazar Najjar MD Entered By: Baltazar Najjar on 03/17/2019 09:25:29 -------------------------------------------------------------------------------- SuperBill Details Patient Name: Date of Service: [name], [name] 03/17/2019 Medical Record [id number] Patient Account Number: Date of Birth/Sex: Treating RN: [birth year] (62 y.o. Male) Zenaida Deed Primary Care Provider: [name] Other Clinician: Referring Provider: Treating Provider/Extender:Nicle Connole, [name], Deirdre Peer Weeks in Treatment: 12 Diagnosis Coding ICD-10 Codes Code Description [phone] Non-pressure chronic ulcer of right calf with fat  layer exposed L97.222 Non-pressure chronic ulcer of left calf with fat layer exposed I89.0 Lymphedema, not elsewhere classified E11.9 Type 2 diabetes mellitus without complications L03.115 Cellulitis of right lower limb Facility Procedures CPT4: Code [id number] Description: 581 BILATERAL: Application of multi-layer venous compression system; leg (below knee),  including ankle and foot. Modifier Quantity: 1 Physician Procedures Electronic Signature(s) Signed: 03/17/2019 6:19:43 PM By: Baltazar Najjar MD Entered By: Baltazar Najjar on 03/17/2019 09:25:53

## 2019-03-21 ENCOUNTER — Encounter (HOSPITAL_BASED_OUTPATIENT_CLINIC_OR_DEPARTMENT_OTHER): Payer: PPO | Admitting: Internal Medicine

## 2019-03-24 ENCOUNTER — Encounter (HOSPITAL_BASED_OUTPATIENT_CLINIC_OR_DEPARTMENT_OTHER): Payer: PPO | Admitting: Internal Medicine

## 2019-03-24 ENCOUNTER — Other Ambulatory Visit: Payer: Self-pay

## 2019-03-24 DIAGNOSIS — E11622 Type 2 diabetes mellitus with other skin ulcer: Secondary | ICD-10-CM | POA: Diagnosis not present

## 2019-03-24 DIAGNOSIS — L97219 Non-pressure chronic ulcer of right calf with unspecified severity: Secondary | ICD-10-CM | POA: Diagnosis not present

## 2019-03-24 DIAGNOSIS — I872 Venous insufficiency (chronic) (peripheral): Secondary | ICD-10-CM | POA: Diagnosis not present

## 2019-03-24 DIAGNOSIS — L97222 Non-pressure chronic ulcer of left calf with fat layer exposed: Secondary | ICD-10-CM | POA: Diagnosis not present

## 2019-03-27 DIAGNOSIS — I890 Lymphedema, not elsewhere classified: Secondary | ICD-10-CM | POA: Diagnosis not present

## 2019-03-28 ENCOUNTER — Ambulatory Visit (HOSPITAL_BASED_OUTPATIENT_CLINIC_OR_DEPARTMENT_OTHER): Payer: PPO | Admitting: Internal Medicine

## 2019-03-28 NOTE — Progress Notes (Signed)
CAYSON, KALB ([id number]) Visit Report for 03/24/2019 Arrival Information Details Patient Name: Date of Service: [name], [name] 03/24/2019 9:00 AM Medical Record [id number] Patient Account Number: [id number] Date of Birth/Sex: Treating RN: [birth year] (63 y.o. [name] Primary Care Briawna Carver: [name] Other Clinician: Referring Vedanshi Massaro: Treating Alois Mincer/Extender:Robson, [name], [name] in Treatment: 1 Visit Information History Since Last Visit Added or deleted any medications: No Patient Arrived: Ambulatory Any new allergies or adverse reactions: No Arrival Time: 09:05 Had a fall or experienced change in No Accompanied By: self activities of daily living that may affect Transfer Assistance: None risk of falls: Patient Identification Verified: Yes Signs or symptoms of abuse/neglect since last No Secondary Verification Process Completed: Yes visito Patient Requires Transmission-Based No Hospitalized since last visit: No Precautions: Implantable device outside of the clinic excluding No Patient Has Alerts: No cellular tissue based products placed in the center since last visit: Has Dressing in Place as Prescribed: Yes Has Compression in Place as Prescribed: Yes Pain Present Now: No Electronic Signature(s) Signed: 03/24/2019 5:13:27 PM By: Cherylin Mylar Entered By: Cherylin Mylar on 03/24/2019 09:05:44 -------------------------------------------------------------------------------- Compression Therapy Details Patient Name: Date of Service: [name] 03/24/2019 9:00 AM Medical Record [id number] Patient Account Number: [id number] Date of Birth/Sex: Treating RN: Aug 07, 1955 (63 y.o. Matthew Koller Primary Care Jerelene Salaam: [name] Other Clinician: Referring Harnoor Kohles: Treating Abdikadir Fohl/Extender:Robson, [name], [name] in Treatment: 1 Compression Therapy Performed for Wound Wound #4  Left,Circumferential Lower Leg Assessment: Performed By: Clinician Zandra Abts, RN Compression Type: Four Layer Post Procedure Diagnosis Same as Pre-procedure Electronic Signature(s) Signed: 03/28/2019 5:52:09 PM By: Zandra Abts RN, [name] Entered By: Zandra Abts on 03/24/2019 10:07:35 -------------------------------------------------------------------------------- Compression Therapy Details Patient Name: Date of Service: [name] 03/24/2019 9:00 AM Medical Record [id number] Patient Account Number: [id number] Date of Birth/Sex: Treating RN: 09/03/1955 (63 y.o. Matthew Koller Primary Care Lucy Woolever: [name] Other Clinician: Referring Sheilia Reznick: Treating Wesleigh Markovic/Extender:Robson, [name], [name] in Treatment: 1 Compression Therapy Performed for Wound NonWound Condition Lymphedema - [name] Leg Assessment: Performed By: Clinician Zandra Abts, RN Compression Type: Four Layer Post Procedure Diagnosis Same as Pre-procedure Electronic Signature(s) Signed: 03/28/2019 5:52:09 PM By: Zandra Abts RN, [name] Entered By: Zandra Abts on 03/24/2019 10:07:49 -------------------------------------------------------------------------------- Encounter Discharge Information Details Patient Name: Date of Service: [name] 03/24/2019 9:00 AM Medical Record [id number] Patient Account Number: [id number] Date of Birth/Sex: Treating RN: 14-Jul-1955 (63 y.o. Tammy Sours Primary Care Jolette Lana: [name] Other Clinician: Referring Jacayla Nordell: Treating Nalini Alcaraz/Extender:Robson, [name], [name] in Treatment: 1 Encounter Discharge Information Items Discharge Condition: Stable Ambulatory Status: Ambulatory Discharge Destination: Home Transportation: Private Auto Accompanied By: self Schedule Follow-up Appointment: Yes Clinical Summary of Care: Electronic Signature(s) Signed: 03/24/2019 6:01:54 PM By: Shawn Stall Entered By: Shawn Stall on 03/24/2019 10:59:51 -------------------------------------------------------------------------------- Lower Extremity Assessment Details Patient Name: Date of Service: [name], [name] 03/24/2019 9:00 AM Medical Record [id number] Patient Account Number: [id number] Date of Birth/Sex: Treating RN: 1956/02/15 (62 y.o. [name] Primary Care Davyn Elsasser: [name] Other Clinician: Referring Maple Odaniel: Treating Laveyah Oriol/Extender:Robson, [name], Deirdre Peer Weeks in Treatment: 1 Edema Assessment Assessed: [Left: No] [[name]: No] Edema: [Left: Yes] [[name]: Yes] Calf Left: [name]: Point of Measurement: 31 cm From Medial Instep 58.5 cm 55.5 cm Ankle Left: [name]: Point of Measurement: 11 cm From Medial Instep 28.5 cm 28.5 cm Vascular Assessment Pulses: Dorsalis Pedis Palpable: [Left:Yes] [[name]:Yes] Electronic Signature(s) Signed: 03/24/2019 5:13:27 PM  By: Cherylin Mylarwiggins, Shannon Entered By: Cherylin Mylarwiggins, Shannon on 03/24/2019 09:12:54 -------------------------------------------------------------------------------- Multi Wound Chart Details Patient Name: Date of Service: Matthew ShamsGATTO, [name]. 03/24/2019 9:00 AM Medical Record [id number] Patient Account Number: [id number] Date of Birth/Sex: Treating RN: 03/03/1956 (62 y.o. [name]) [name], [name] Primary Care Rudolph Dobler: [name], [name] Other Clinician: Referring Shelbee Apgar: Treating Khaliel Morey/Extender:Robson, Almira CoasterMichael Polite, [name] in Treatment: 1 Vital Signs Height(in): 73 Capillary Blood 135 Glucose(mg/dl): Weight(lbs): 914425 Pulse(bpm): 85 Body Mass Index(BMI): 56 Blood Pressure(mmHg): 161/78 Temperature(F): 97.8 Respiratory 17 Rate(breaths/min): Photos: [4:No Photos] [5:No Photos] [N/A:N/A] Wound Location: [4:Left Lower Leg - Circumferential] [5:[name] Lower Leg - Lateral N/A] Wounding Event: [4:Gradually Appeared] [5:Gradually Appeared] [N/A:N/A] 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] [5:Venous Leg Ulcer] [N/A:N/A] 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] [5:Lower Extremity] Date Acquired: [4:04/15/2018] [5:04/15/2018] [N/A:N/A] Weeks of Treatment: [4:13] [5:13] [N/A:N/A] Wound Status: [4:Open] [5:Healed - Epithelialized] [N/A:N/A] Clustered Wound: [4:Yes] [5:Yes] [N/A:N/A] Clustered Quantity: [4:3] [5:N/A] [N/A:N/A] Measurements L x W x D 3.5x4.5x0.1 [5:0x0x0] [N/A:N/A] (cm) Area (cm) : [4:12.37] [5:0] [N/A:N/A] Volume (cm) : [4:1.237] [5:0] [N/A:N/A] % Reduction in Area: [4:97.20%] [5:100.00%] [N/A:N/A] % Reduction in Volume: 97.20% [5:100.00%] [N/A:N/A] Classification: [4:Full Thickness Without Exposed Support Structures Exposed Support Structures] [5:Full Thickness Without] [N/A:N/A] Exudate Amount: [4:Medium] [5:None Present] [N/A:N/A] Exudate Type: [4:Serosanguineous] [5:N/A] [N/A:N/A] Exudate Color: [4:red, brown] [5:N/A] [N/A:N/A] Wound Margin: [4:Distinct, outline attached Distinct, outline attached N/A] Granulation Amount: [4:Large (67-100%)] [5:None Present (0%)] [N/A:N/A] Granulation Quality: [4:Red, Pink] [5:N/A] [N/A:N/A] Necrotic Amount: [4:Small (1-33%)] [5:None Present (0%)] [N/A:N/A] Exposed Structures: [4:Fat Layer (Subcutaneous Fascia: No Tissue) Exposed: Yes Fascia: No Tendon: No Muscle: No Joint: No Bone: No] [5:Fat Layer (Subcutaneous Tissue) Exposed: No Tendon: No Muscle: No Joint: No Bone: No] [N/A:N/A] Epithelialization: [4:Medium (34-66%)] [5:Large (67-100%) N/A] [N/A:N/A N/A] Treatment Notes Electronic Signature(s) Signed: 03/26/2019 10:27:00 AM By: Baltazar Najjarobson, Michael MD Signed: 03/28/2019 5:52:09 PM By: Zandra AbtsLynch, [name] RN, [name] Entered By: Baltazar Najjarobson, Michael on 03/24/2019 10:34:05 -------------------------------------------------------------------------------- Multi-Disciplinary Care Plan Details Patient Name: Date of Service: Matthew ShamsGATTO, Idriss [name]. 03/24/2019 9:00 AM Medical  Record [id number] Patient Account Number: [id number] Date of Birth/Sex: Treating RN: 11/29/1955 (63 y.o. [name]) [name], [name] Primary Care Timmia Cogburn: [name], [name] Other Clinician: Referring Kirstine Jacquin: Treating Koa Palla/Extender:Robson, Almira CoasterMichael Polite, [name] in Treatment: 1 Active Inactive Venous Leg Ulcer Nursing Diagnoses: Knowledge deficit related to disease process and management Potential for venous Insuffiency (use before diagnosis confirmed) Goals: Patient will maintain optimal edema control Date Initiated: 03/03/2019 Target Resolution Date: 03/31/2019 Goal Status: Active Interventions: Compression as ordered Provide education on venous insufficiency Treatment Activities: Therapeutic compression applied : 03/03/2019 Notes: Electronic Signature(s) Signed: 03/28/2019 5:52:09 PM By: Zandra AbtsLynch, [name] RN, [name] Entered By: Zandra AbtsLynch, [name] on 03/24/2019 10:15:57 -------------------------------------------------------------------------------- Pain Assessment Details Patient Name: Date of Service: Matthew ShamsGATTO, Truett [name]. 03/24/2019 9:00 AM Medical Record [id number] Patient Account Number: [id number] Date of Birth/Sex: Treating RN: 07/27/1955 (63 y.o. Matthew RightM) Dwiggins, Shannon Primary Care Zubair Lofton: [name], [name] Other Clinician: Referring Rodney Yera: Treating Verlyn Lambert/Extender:Robson, Almira CoasterMichael Polite, [name] in Treatment: 1 Active Problems Location of Pain Severity and Description of Pain Patient Has Paino No Site Locations Pain Management and Medication Current Pain Management: Electronic Signature(s) Signed: 03/24/2019 5:13:27 PM By: Cherylin Mylarwiggins, Shannon Entered By: Cherylin Mylarwiggins, Shannon on 03/24/2019 09:09:48 -------------------------------------------------------------------------------- Patient/Caregiver Education Details Patient Name: Date of Service: Matthew ShamsGATTO, Tyland [name]. 10/9/2020andnbsp9:00 AM Medical Record [id number] Patient Account Number:  [id number] Date of Birth/Gender: Treating RN: 01/22/1956 [age] y.o. [name]) [name], [name] Primary Care Physician: [name], [name] Other Clinician: Referring  Physician: Treating Physician/Extender:Robson, [name], [name] in Treatment: 1 Education Assessment Education Provided To: Patient Education Topics Provided Wound/Skin Impairment: Methods: Explain/Verbal Responses: State content correctly Electronic Signature(s) Signed: 03/28/2019 5:52:09 PM By: Zandra Abts RN, [name] Entered By: Zandra Abts on 03/24/2019 10:15:16 -------------------------------------------------------------------------------- Wound Assessment Details Patient Name: Date of Service: [name] 03/24/2019 9:00 AM Medical Record [id number] Patient Account Number: [id number] Date of Birth/Sex: Treating RN: 1955/09/27 (62 y.o. [name] Primary Care Shalayah Beagley: [name] Other Clinician: Referring Arvin Abello: Treating Juandedios Dudash/Extender:Robson, [name], Deirdre Peer Weeks in Treatment: 1 Wound Status Wound Number: 4 Primary Etiology: Venous Leg Ulcer Wound Location: Left Lower Leg - Circumferential Secondary Diabetic Wound/Ulcer of the Lower Etiology: Extremity Wounding Event: Gradually Appeared Wound Status: Open Date Acquired: 04/15/2018 Weeks Of Treatment: 13 Clustered Wound: Yes Photos Wound Measurements Length: (cm) 3.5 % Reduct Width: (cm) 4.5 % Reduct Depth: (cm) 0.1 Epitheli Clustered Quantity: 3 Tunnelin Area: (cm) 12.37 Undermi Volume: (cm) 1.237 Wound Description Full Thickness Without Exposed Support Foul Odo Classification: Structures Slough/F Wound Distinct, outline attached Margin: Exudate Medium Amount: Exudate Serosanguineous Type: Exudate red, brown Color: Wound Bed Granulation Amount: Large (67-100%) Granulation Quality: Red, Pink Fascia E Necrotic Amount: Small (1-33%) Fat Laye Necrotic Quality: Adherent Slough Tendon  E Muscle E Joint Ex Bon r After Cleansing: No ibrino Yes Exposed Structure xposed: No r (Subcutaneous Tissue) Exposed: Yes xposed: No xposed: No posed: No e Exposed: No ion in Area: 97.2% ion in Volume: 97.2% alization: Medium (34-66%) g: No ning: No Treatment Notes Wound #4 (Left, Circumferential Lower Leg) 1. Cleanse With Wound Cleanser Soap and water 2. Periwound Care Moisturizing lotion 3. Primary Dressing Applied Hydrofera Blue 4. Secondary Dressing ABD Pad 6. Support Layer Applied 4 layer compression wrap Notes netting. first layer of unna boot applied to upper portion of lower leg to aid in keeping compression wraps in place. Electronic Signature(s) Signed: 03/27/2019 3:43:33 PM By: Benjaman Kindler EMT/HBOT Signed: 03/27/2019 5:18:03 PM By: Cherylin Mylar Previous Signature: 03/24/2019 5:13:27 PM Version By: Cherylin Mylar Entered By: Benjaman Kindler on 03/27/2019 08:51:26 -------------------------------------------------------------------------------- Wound Assessment Details Patient Name: Date of Service: DORA, PRAH 03/24/2019 9:00 AM Medical Record [id number] Patient Account Number: [id number] Date of Birth/Sex: Treating RN: August 26, 1955 (63 y.o. [name] Primary Care Lari Linson: [name] Other Clinician: Referring Obadiah Dennard: Treating Mamta Rimmer/Extender:Robson, [name], Deirdre Peer Weeks in Treatment: 1 Wound Status Wound Number: 5 Primary Etiology: Venous Leg Ulcer Wound Location: [name] Lower Leg - Lateral Secondary Diabetic Wound/Ulcer of the Lower Etiology: Extremity Wounding Event: Gradually Appeared Wound Status: Healed - Epithelialized Date Acquired: 04/15/2018 Weeks Of Treatment: 13 Clustered Wound: Yes Photos Wound Measurements Length: (cm) 0 % Reduct Width: (cm) 0 % Reduct Depth: (cm) 0 Epitheli Area: (cm) 0 Tunneli Volume: (cm) 0 Undermi Wound Description Classification: Full Thickness Without  Exposed Support Foul Odo Structures Slough/F Wound Distinct, outline attached Margin: Exudate None Present Amount: Wound Bed Granulation Amount: None Present (0%) Necrotic Amount: None Present (0%) Fascia E Fat Laye Tendon E Muscle E Joint Ex Bone Exp r After Cleansing: No ibrino No Exposed Structure xposed: No r (Subcutaneous Tissue) Exposed: No xposed: No xposed: No posed: No osed: No ion in Area: 100% ion in Volume: 100% alization: Large (67-100%) ng: No ning: No Electronic Signature(s) Signed: 03/27/2019 3:43:33 PM By: Benjaman Kindler EMT/HBOT Signed: 03/27/2019 5:18:03 PM By: Cherylin Mylar Previous Signature: 03/24/2019 5:13:27 PM Version By: Cherylin Mylar Entered By: Benjaman Kindler on 03/27/2019 08:51:51 --------------------------------------------------------------------------------  Vitals Details Patient Name: Date of Service: ASHTIAN, VILLACIS 03/24/2019 9:00 AM Medical Record [id number] Patient Account Number: [id number] Date of Birth/Sex: Treating RN: 11-18-1955 (63 y.o. [name] Primary Care Loray Akard: Kandice Hams Other Clinician: Referring Khala Tarte: Treating Lowry Bala/Extender:Robson, Alice Reichert, Lowella Dandy in Treatment: 1 Vital Signs Time Taken: 09:05 Temperature (F): 97.8 Height (in): 73 Pulse (bpm): 85 Weight (lbs): 425 Respiratory Rate (breaths/min): 17 Body Mass Index (BMI): 56.1 Blood Pressure (mmHg): 161/78 Capillary Blood Glucose (mg/dl): 135 Reference Range: 80 - 120 mg / dl Notes patient reported CBG of 135 this morning Electronic Signature(s) Signed: 03/24/2019 5:13:27 PM By: Kela Millin Entered By: Kela Millin on 03/24/2019 09:09:42

## 2019-03-28 NOTE — Progress Notes (Signed)
[name], [name] ([id number]) Visit Report for 03/24/2019 HPI Details Patient Name: Date of Service: [name], [name] 03/24/2019 9:00 AM Medical Record [id number] Patient Account Number: [id number] Date of Birth/Sex: Treating RN: 1956-04-06 (63 y.o. [name] Primary Care Provider: Katy [name] Other Clinician: Referring Provider: Treating Provider/Extender:[name], [name], [name] in Treatment: 1 History of Present Illness HPI Description: 63 year old male with several years history of bilateral lower extremity lymphedema, apparently had been established with wound clinic in 2008, now presents with worsening lower extremity ulcers on both sides. Patient was seen in April by his primary care physician and was prescribed a course of Bactrim for right leg ulcer with cellulitis, he has not been under any form of compression recently. Since April he has noticed gradual worsening of these wounds on both legs. He denies any significant pain, he has been using pads and dressing from over-the-counter purchase to keep the drainage down and he has noticed this has been increasing over the past couple of weeks. He denies any systemic symptoms of fevers chills or shakes. He continues to smoke a pack a day. He does not have a diagnosis of sleep apnea or has never been tested for it. Patient has history of congestive heart failure although latest echo shows preserved ejection fraction and normal chambers, type 2 diabetes on insulin and last known ABIs prior to the clinic in 2015 that were normal ABIs today in the clinic are 1.43 on the left and 1.22 on the right 7/14; patient admitted to the clinic last week. He has longstanding wounds on the left leg about a year and on the right leg for the last 6 months. The area on the left is almost circumferential. He has lymphedema changes of chronic venous insufficiency. Comes in today with leaking edema fluid extremely malodorous.  He is going to need to come in for a nurse change on Friday. 7/21; substantial wounds on the left leg and right lateral leg. All of these covered in adherent surface slough. We have been using silver alginate under 4-layer compression 7/28 substantial wounds on the left greater than right leg. The left is on the medial and posterior right on the lateral. Severe chronic venous insufficiency with secondary lymphedema. The patient states he has a history of wounds with the last one was 3 years ago. He does not wear stockings 8/4-Patient returns at 1 week, wounds are slightly improved than last time, we are continuing 4 layer compression 8/11-Patient returns at 1 week after being in 4 layer compression continues to have significant drainage on the right and apparently malodorous drainage on the left from the 2 wounds. We are using silver alginate 8/18; 4 layer compression for large wounds on the right lateral and left medial calfs in the setting of chronic venous insufficiency and secondary lymphedema. A culture was done last week that showed both Proteus and MSSA. I prescribed Augmentin twice daily for 7 days. This will cover any on cultured anaerobes there is apparently a very significant odor last week with purulent looking drainage 8/25; wounds have some surface debris but not much overall. The area on the left measures smaller. There are islands of epithelialization on the right lateral. Been using silver alginate. He is completing his Augmentin 9/1; surface area of the wounds is down, wound surfaces look excellent using Hydrofera Blue under 4-layer compression 9/10-Patient returns at 1 week with both legs in 4 layer compression wraps with Hydrofera Blue. The wound surfaces are continuing to  improve with a fair amount of exudate from the surfaces as expected 9/18; continue with Hydrofera Blue under 4-layer compression making good improvements in both wound areas. We are going to order him  bilateral juxta lites. With regards to the compression pumps he did not seem to enamored about the possible cost of this as he does not have a secondary insurance 9/25; the patient has a small wound on the right lateral and left posterior calf. Comes in today with considerable amount of edema in the right calf is wrapped fell down. There is also edema in the left mid calf. Even with our 4- layer compression we really do not have good edema control 10/2; small wound on the right is closed over. May be some non-completely epithelialized areas on the back of the right calf. Left posterior calf not much change although surface looks good. We do not have perfect edema control using Hydrofera Blue Were having issues with external compression garment ordering. Compression pumps are in the works. He will definitely need the latter 10/9; he still does not have the juxta lite stockings. We are in process of ordering external compression pumps I think he will need both of these in order to maintain skin integrity. He does not have an open wound currently on the right leg. The only thing he has on the left is a superficial area on the medial mid calf. Electronic Signature(s) Signed: 03/26/2019 10:27:00 AM By: [name], Matthew Solorio MD Entered By: [name], [name] on 03/24/2019 10:39:23 -------------------------------------------------------------------------------- Physical Exam Details Patient Name: Date of Service: [name], Matthew J. 03/24/2019 9:00 AM Medical Record [id number] Patient Account Number: [id number] Date of Birth/Sex: Treating RN: 08/17/1955 (63 y.o. [name]) [name], Matthew Primary Care Provider: [name], [name] Other Clinician: Referring Provider: Treating Provider/Extender:[name], Matthew CoasterMichael [name], Matthew Guestonald [name] Weeks in Treatment: 1 Constitutional Patient is hypertensive.. Pulse regular and within target range for patient.Marland Kitchen. Respirations regular, non-labored and within target range.. Temperature  is normal and within the target range for the patient.Marland Kitchen. Appears in no distress. Eyes Conjunctivae clear. No discharge.no icterus. Respiratory work of breathing is normal. Cardiovascular Pedal pulses are palpable. Lymphatic None palpable in the popliteal area bilaterally. Integumentary (Hair, Skin) Severe hemosiderin deposition. Psychiatric appears at normal baseline. Notes Wound exam; the only remaining wound is on the left lateral mid calf. Very superficial no debridement is required. We have good edema control in our bilateral compression wraps. There is no open wound on the right Electronic Signature(s) Signed: 03/26/2019 10:27:00 AM By: [name], Kimberlyann Hollar MD Entered By: [name], Remedios Mckone on 03/24/2019 10:40:42 -------------------------------------------------------------------------------- Physician Orders Details Patient Name: Date of Service: [name], Matthew J. 03/24/2019 9:00 AM Medical Record [id number] Patient Account Number: [id number] Date of Birth/Sex: Treating RN: 10/03/1955 (63 y.o. [name]) [name], Matthew Primary Care Provider: [name], [name] Other Clinician: Referring Provider: Treating Provider/Extender:Zackry Deines, Matthew CoasterMichael [name], Matthew Guestonald [name] Weeks in Treatment: 1 Verbal / Phone Orders: No Diagnosis Coding ICD-10 Coding Code Description [phone] Non-pressure chronic ulcer of right calf with fat layer exposed L97.222 Non-pressure chronic ulcer of left calf with fat layer exposed I89.0 Lymphedema, not elsewhere classified E11.9 Type 2 diabetes mellitus without complications Follow-up Appointments Return Appointment in 1 week. - Friday Nurse Visit: - Tuesday for rewrap Dressing Change Frequency Wound #4 Left,Circumferential Lower Leg Do not change entire dressing for one week. Skin Barriers/Peri-Wound Care Wound #4 Left,Circumferential Lower Leg Barrier cream - as needed for maceration, wetness, or redness. Moisturizing lotion Wound Cleansing Wound #4  Left,Circumferential Lower Leg May shower with protection. Primary Wound  Dressing Wound #4 Left,Circumferential Lower Leg Hydrofera Blue Secondary Dressing Wound #4 Left,Circumferential Lower Leg ABD pad Edema Control 4 layer compression - Bilateral - use unna layer at top of calves to secure Avoid standing for long periods of time Elevate legs to the level of the heart or above for 30 minutes daily and/or when sitting, a frequency of: - throughout the day. Exercise regularly Segmental Compressive Device. - lymphadema pumps 60 minutes 2 times per day when available Other: - May [name]/c compression wrap on right leg and apply Juxtalite on next nurse visit Electronic Signature(s) Signed: 03/26/2019 10:27:00 AM By: Matthew Najjar MD Signed: 03/28/2019 5:52:09 PM By: Zandra Abts RN, [name] Entered By: Zandra Abts on 03/24/2019 10:06:15 -------------------------------------------------------------------------------- Problem List Details Patient Name: Date of Service: [name], [name] 03/24/2019 9:00 AM Medical Record [id number] Patient Account Number: [id number] Date of Birth/Sex: Treating RN: 09/18/1955 (63 y.o. [name] Primary Care Provider: Katy [name] Other Clinician: Referring Provider: Treating Provider/Extender:Kaylaann Mountz, [name], [name] in Treatment: 1 Active Problems ICD-10 Evaluated Encounter Code Description Active Date Today Diagnosis L97.212 Non-pressure chronic ulcer of right calf with fat layer 12/20/2018 No Yes exposed L97.222 Non-pressure chronic ulcer of left calf with fat layer 12/20/2018 No Yes exposed I89.0 Lymphedema, not elsewhere classified 12/20/2018 No Yes E11.9 Type 2 diabetes mellitus without complications 12/20/2018 No Yes Inactive Problems ICD-10 Code Description Active Date Inactive Date L03.115 Cellulitis of right lower limb 01/31/2019 01/31/2019 Resolved Problems Electronic Signature(s) Signed: 03/26/2019 10:27:00 AM  By: Matthew Najjar MD Entered By: Matthew Najjar on 03/24/2019 10:32:00 -------------------------------------------------------------------------------- Progress Note Details Patient Name: Date of Service: Matthew Shams 03/24/2019 9:00 AM Medical Record [id number] Patient Account Number: [id number] Date of Birth/Sex: Treating RN: 07-Feb-1956 (63 y.o. [name] Primary Care Provider: Katy [name] Other Clinician: Referring Provider: Treating Provider/Extender:Ruvim Risko, [name], [name] in Treatment: 1 Subjective History of Present Illness (HPI) 63 year old male with several years history of bilateral lower extremity lymphedema, apparently had been established with wound clinic in 2008, now presents with worsening lower extremity ulcers on both sides. Patient was seen in April by his primary care physician and was prescribed a course of Bactrim for right leg ulcer with cellulitis, he has not been under any form of compression recently. Since April he has noticed gradual worsening of these wounds on both legs. He denies any significant pain, he has been using pads and dressing from over-the- counter purchase to keep the drainage down and he has noticed this has been increasing over the past couple of weeks. He denies any systemic symptoms of fevers chills or shakes. He continues to smoke a pack a day. He does not have a diagnosis of sleep apnea or has never been tested for it. Patient has history of congestive heart failure although latest echo shows preserved ejection fraction and normal chambers, type 2 diabetes on insulin and last known ABIs prior to the clinic in 2015 that were normal ABIs today in the clinic are 1.43 on the left and 1.22 on the right 7/14; patient admitted to the clinic last week. He has longstanding wounds on the left leg about a year and on the right leg for the last 6 months. The area on the left is almost circumferential. He has  lymphedema changes of chronic venous insufficiency. Comes in today with leaking edema fluid extremely malodorous. He is going to need to come in for a nurse change on Friday. 7/21; substantial wounds on the left leg  and right lateral leg. All of these covered in adherent surface slough. We have been using silver alginate under 4-layer compression 7/28 substantial wounds on the left greater than right leg. The left is on the medial and posterior right on the lateral. Severe chronic venous insufficiency with secondary lymphedema. The patient states he has a history of wounds with the last one was 3 years ago. He does not wear stockings 8/4-Patient returns at 1 week, wounds are slightly improved than last time, we are continuing 4 layer compression 8/11-Patient returns at 1 week after being in 4 layer compression continues to have significant drainage on the right and apparently malodorous drainage on the left from the 2 wounds. We are using silver alginate 8/18; 4 layer compression for large wounds on the right lateral and left medial calfs in the setting of chronic venous insufficiency and secondary lymphedema. A culture was done last week that showed both Proteus and MSSA. I prescribed Augmentin twice daily for 7 days. This will cover any on cultured anaerobes there is apparently a very significant odor last week with purulent looking drainage 8/25; wounds have some surface debris but not much overall. The area on the left measures smaller. There are islands of epithelialization on the right lateral. Been using silver alginate. He is completing his Augmentin 9/1; surface area of the wounds is down, wound surfaces look excellent using Hydrofera Blue under 4-layer compression 9/10-Patient returns at 1 week with both legs in 4 layer compression wraps with Hydrofera Blue. The wound surfaces are continuing to improve with a fair amount of exudate from the surfaces as expected 9/18; continue with  Hydrofera Blue under 4-layer compression making good improvements in both wound areas. We are going to order him bilateral juxta lites. With regards to the compression pumps he did not seem to enamored about the possible cost of this as he does not have a secondary insurance 9/25; the patient has a small wound on the right lateral and left posterior calf. Comes in today with considerable amount of edema in the right calf is wrapped fell down. There is also edema in the left mid calf. Even with our 4- layer compression we really do not have good edema control 10/2; small wound on the right is closed over. May be some non-completely epithelialized areas on the back of the right calf. Left posterior calf not much change although surface looks good. We do not have perfect edema control using Hydrofera Blue Were having issues with external compression garment ordering. Compression pumps are in the works. He will definitely need the latter 10/9; he still does not have the juxta lite stockings. We are in process of ordering external compression pumps I think he will need both of these in order to maintain skin integrity. He does not have an open wound currently on the right leg. The only thing he has on the left is a superficial area on the medial mid calf. Objective Constitutional Patient is hypertensive.. Pulse regular and within target range for patient.Marland Kitchen Respirations regular, non-labored and within target range.. Temperature is normal and within the target range for the patient.Marland Kitchen Appears in no distress. Vitals Time Taken: 9:05 AM, Height: 73 in, Weight: 425 lbs, BMI: 56.1, Temperature: 97.8 F, Pulse: 85 bpm, Respiratory Rate: 17 breaths/min, Blood Pressure: 161/78 mmHg, Capillary Blood Glucose: 135 mg/dl. General Notes: patient reported CBG of 135 this morning Eyes Conjunctivae clear. No discharge.no icterus. Respiratory work of breathing is normal. Cardiovascular Pedal pulses are  palpable.  Lymphatic None palpable in the popliteal area bilaterally. Psychiatric appears at normal baseline. General Notes: Wound exam; the only remaining wound is on the left lateral mid calf. Very superficial no debridement is required. We have good edema control in our bilateral compression wraps. There is no open wound on the right Integumentary (Hair, Skin) Severe hemosiderin deposition. Wound #4 status is Open. Original cause of wound was Gradually Appeared. The wound is located on the Left,Circumferential Lower Leg. The wound measures 3.5cm length x 4.5cm width x 0.1cm depth; 12.37cm^2 area and 1.237cm^3 volume. There is Fat Layer (Subcutaneous Tissue) Exposed exposed. There is no tunneling or undermining noted. There is a medium amount of serosanguineous drainage noted. The wound margin is distinct with the outline attached to the wound base. There is large (67-100%) red, pink granulation within the wound bed. There is a small (1-33%) amount of necrotic tissue within the wound bed including Adherent Slough. Wound #5 status is Healed - Epithelialized. Original cause of wound was Gradually Appeared. The wound is located on the Right,Lateral Lower Leg. The wound measures 0cm length x 0cm width x 0cm depth; 0cm^2 area and 0cm^3 volume. There is no tunneling or undermining noted. There is a none present amount of drainage noted. The wound margin is distinct with the outline attached to the wound base. There is no granulation within the wound bed. There is no necrotic tissue within the wound bed. 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 There was a Four Layer  Compression Therapy Procedure by Zandra Abts, RN. Post procedure Diagnosis Wound #4: Same as Pre-Procedure There was a Four Layer Compression Therapy Procedure by Zandra Abts, RN. Post procedure Diagnosis Wound #: Same as Pre-Procedure Plan Follow-up Appointments: Return Appointment in 1 week. - Friday Nurse Visit: - Tuesday for rewrap Dressing Change Frequency: Wound #4 Left,Circumferential Lower Leg: Do not change entire dressing for one week. Skin Barriers/Peri-Wound Care: Wound #4 Left,Circumferential Lower Leg: Barrier cream - as needed for maceration, wetness, or redness. Moisturizing lotion Wound Cleansing: Wound #4 Left,Circumferential Lower Leg: May shower with protection. Primary Wound Dressing: Wound #4 Left,Circumferential Lower Leg: Hydrofera Blue Secondary Dressing: Wound #4 Left,Circumferential Lower Leg: ABD pad Edema Control: 4 layer compression - Bilateral - use unna layer at top of calves to secure Avoid standing for long periods of time Elevate legs to the level of the heart or above for 30 minutes daily and/or when sitting, a frequency of: - throughout the day. Exercise regularly Segmental Compressive Device. - lymphadema pumps 60 minutes 2 times per day when available Other: - May [name]/c compression wrap on right leg and apply Juxtalite on next nurse visit 1. Continue Hydrofera Blue ABDs 4-layer compression 2. I have wrapped his right leg with 4-layer compression even though he does not have a wound care while we await for his bilateral juxta lite stockings. Electronic Signature(s) Signed: 03/26/2019 10:27:00 AM By: Matthew Najjar MD Entered By: Matthew Najjar on 03/24/2019 10:43:31 -------------------------------------------------------------------------------- SuperBill Details Patient Name: Date of Service: [name], [name] 03/24/2019 Medical Record [id number] Patient Account Number: [id number] Date of Birth/Sex: Treating RN: 1956/03/18  (63 y.o. [name] Primary Care Provider: Katy [name] Other Clinician: Referring Provider: Treating Provider/Extender:Jamelia Varano, [name], Deirdre Peer  Weeks in Treatment: 1 Diagnosis Coding ICD-10 Codes Code Description [phone] Non-pressure chronic ulcer of right calf with fat layer exposed L97.222 Non-pressure chronic ulcer of left calf with fat layer exposed I89.0 Lymphedema, not elsewhere classified E11.9 Type 2 diabetes mellitus without complications Facility Procedures CPT4: Description Modifier Quantity Code [id number] BILATERAL: Application of multi-layer venous compression system; leg 1 (below knee), including ankle and foot. Physician Procedures CPT4 Code: [id number] Description: 99213 - WC PHYS LEVEL 3 - EST PT ICD-10 Diagnosis Description L97.222 Non-pressure chronic ulcer of left calf with fat lay L97.212 Non-pressure chronic ulcer of right calf with fat la I89.0 Lymphedema, not elsewhere classified E11.9 Type 2  diabetes mellitus without complications Modifier: er exposed yer exposed Quantity: 1 Electronic Signature(s) Signed: 03/26/2019 10:27:00 AM By: [name], Shamari Lofquist MD Entered By: [name], Theoplis Garciagarcia on 03/24/2019 10:44:15

## 2019-03-31 ENCOUNTER — Encounter (HOSPITAL_BASED_OUTPATIENT_CLINIC_OR_DEPARTMENT_OTHER): Payer: PPO | Admitting: Internal Medicine

## 2019-03-31 ENCOUNTER — Other Ambulatory Visit: Payer: Self-pay

## 2019-03-31 DIAGNOSIS — I890 Lymphedema, not elsewhere classified: Secondary | ICD-10-CM | POA: Diagnosis not present

## 2019-03-31 DIAGNOSIS — E11622 Type 2 diabetes mellitus with other skin ulcer: Secondary | ICD-10-CM | POA: Diagnosis not present

## 2019-03-31 DIAGNOSIS — L97222 Non-pressure chronic ulcer of left calf with fat layer exposed: Secondary | ICD-10-CM | POA: Diagnosis not present

## 2019-03-31 DIAGNOSIS — I872 Venous insufficiency (chronic) (peripheral): Secondary | ICD-10-CM | POA: Diagnosis not present

## 2019-03-31 NOTE — Progress Notes (Signed)
[name], Minard J. ([id number]) Visit Report for 03/31/2019 HPI Details Patient Name: Date of Service: [name], Bader J. 03/31/2019 8:45 AM Medical Record [id number] Patient Account Number: [id number] Date of Birth/Sex: Treating RN: 03/28/1956 (63 y.o. [name]) [name], [name] Primary Care Provider: [name], [name] Other Clinician: Referring Provider: Treating Provider/Extender:Reniya Mcclees, Almira CoasterMichael Polite, [name] in Treatment: 2 History of Present Illness HPI Description: 63 year old male with several years history of bilateral lower extremity lymphedema, apparently had been established with wound clinic in 2008, now presents with worsening lower extremity ulcers on both sides. Patient was seen in April by his primary care physician and was prescribed a course of Bactrim for right leg ulcer with cellulitis, he has not been under any form of compression recently. Since April he has noticed gradual worsening of these wounds on both legs. He denies any significant pain, he has been using pads and dressing from over-the-counter purchase to keep the drainage down and he has noticed this has been increasing over the past couple of weeks. He denies any systemic symptoms of fevers chills or shakes. He continues to smoke a pack a day. He does not have a diagnosis of sleep apnea or has never been tested for it. Patient has history of congestive heart failure although latest echo shows preserved ejection fraction and normal chambers, type 2 diabetes on insulin and last known ABIs prior to the clinic in 2015 that were normal ABIs today in the clinic are 1.43 on the left and 1.22 on the right 7/14; patient admitted to the clinic last week. He has longstanding wounds on the left leg about a year and on the right leg for the last 6 months. The area on the left is almost circumferential. He has lymphedema changes of chronic venous insufficiency. Comes in today with leaking edema fluid extremely  malodorous. He is going to need to come in for a nurse change on Friday. 7/21; substantial wounds on the left leg and right lateral leg. All of these covered in adherent surface slough. We have been using silver alginate under 4-layer compression 7/28 substantial wounds on the left greater than right leg. The left is on the medial and posterior right on the lateral. Severe chronic venous insufficiency with secondary lymphedema. The patient states he has a history of wounds with the last one was 3 years ago. He does not wear stockings 8/4-Patient returns at 1 week, wounds are slightly improved than last time, we are continuing 4 layer compression 8/11-Patient returns at 1 week after being in 4 layer compression continues to have significant drainage on the right and apparently malodorous drainage on the left from the 2 wounds. We are using silver alginate 8/18; 4 layer compression for large wounds on the right lateral and left medial calfs in the setting of chronic venous insufficiency and secondary lymphedema. A culture was done last week that showed both Proteus and MSSA. I prescribed Augmentin twice daily for 7 days. This will cover any on cultured anaerobes there is apparently a very significant odor last week with purulent looking drainage 8/25; wounds have some surface debris but not much overall. The area on the left measures smaller. There are islands of epithelialization on the right lateral. Been using silver alginate. He is completing his Augmentin 9/1; surface area of the wounds is down, wound surfaces look excellent using Hydrofera Blue under 4-layer compression 9/10-Patient returns at 1 week with both legs in 4 layer compression wraps with Hydrofera Blue. The wound surfaces are continuing to  improve with a fair amount of exudate from the surfaces as expected 9/18; continue with Hydrofera Blue under 4-layer compression making good improvements in both wound areas. We are going to  order him bilateral juxta lites. With regards to the compression pumps he did not seem to enamored about the possible cost of this as he does not have a secondary insurance 9/25; the patient has a small wound on the right lateral and left posterior calf. Comes in today with considerable amount of edema in the right calf is wrapped fell down. There is also edema in the left mid calf. Even with our 4- layer compression we really do not have good edema control 10/2; small wound on the right is closed over. May be some non-completely epithelialized areas on the back of the right calf. Left posterior calf not much change although surface looks good. We do not have perfect edema control using Hydrofera Blue Were having issues with external compression garment ordering. Compression pumps are in the works. He will definitely need the latter 10/9; he still does not have the juxta lite stockings. We are in process of ordering external compression pumps I think he will need both of these in order to maintain skin integrity. He does not have an open wound currently on the right leg. The only thing he has on the left is a superficial area on the medial mid calf. 10/16; he has his juxta lite stockings removed without on the right leg today. He also has his compression pumps. 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 [name] Primary Care Provider: [name] Other Clinician: Referring Provider: Treating  Provider/Extender:Joycelynn Fritsche, [name], [name] in Treatment: 2 Constitutional Patient is hypertensive.. Pulse regular and within target range for patient.Marland Kitchen Respirations regular, non-labored and within target range.. Temperature is normal and within the target range for the patient.Marland Kitchen Appears in no distress. Respiratory work of breathing is normal. Cardiovascular Pedal pulses palpable.. Integumentary (Hair, Skin) Severe bilateral hemosiderin deposition secondary to chronic venous insufficiency. Secondary lymphedema. Notes Wound exam; the only remaining wound is on the left lateral mid calf. Superficial. We have decent edema control bilaterally. Peripheral pulses are palpable Electronic Signature(s) Signed: 03/31/2019 5:33:39 PM By: Baltazar Najjar MD Entered By: Baltazar Najjar on 03/31/2019 10:08:35 -------------------------------------------------------------------------------- Physician Orders Details Patient Name: Date of Service: LUISMANUEL, CORMAN 03/31/2019 8:45 AM Medical Record [id number] Patient Account Number: [id number] Date of Birth/Sex: Treating RN: 1955/09/24 (63 y.o. [name] Primary Care Provider: [name] Other Clinician: Referring Provider: Treating Provider/Extender:Tenleigh Byer, [name], [name] in Treatment: 2 Verbal / Phone Orders: No Diagnosis Coding ICD-10 Coding Code Description [phone] Non-pressure chronic ulcer of right calf with fat layer exposed L97.222 Non-pressure chronic ulcer of left calf with fat layer exposed I89.0 Lymphedema, not elsewhere classified E11.9 Type 2 diabetes mellitus without complications Follow-up Appointments Return Appointment in 1 week. - Friday Dressing Change Frequency Wound #4 Left,Circumferential Lower Leg Do not change entire dressing for one week. Skin Barriers/Peri-Wound Care Wound #4 Left,Circumferential Lower Leg Barrier cream - as needed for maceration, wetness, or  redness. Moisturizing lotion Wound Cleansing Wound #  4 Left,Circumferential Lower Leg May shower with protection. Primary Wound Dressing Wound #4 Left,Circumferential Lower Leg Hydrofera Blue Secondary Dressing Wound #4 Left,Circumferential Lower Leg ABD pad Edema Control 4 layer compression: Left lower extremity - use unna layer at top of calf to secure Avoid standing for long periods of time Elevate legs to the level of the heart or above for 30 minutes daily and/or when sitting, a frequency of: - throughout the day. Exercise regularly Support Garment 30-40 mm/Hg pressure to: - Juxtalite to right leg daily Segmental Compressive Device. - lymphadema pumps 60 minutes 2 times per day when available Electronic Signature(s) Signed: 03/31/2019 5:33:39 PM By: Linton Ham MD Signed: 03/31/2019 6:00:55 PM By: Levan Hurst RN, [name] Entered By: Levan Hurst on 03/31/2019 09:58:45 -------------------------------------------------------------------------------- Problem List Details Patient Name: Date of Service: RAMIREZ, FULLBRIGHT 03/31/2019 8:45 AM Medical Record [id number] Patient Account Number: [id number] Date of Birth/Sex: Treating RN: Jun 18, 1955 (63 y.o. [name] Primary Care Provider: Kandice Hams Other Clinician: Referring Provider: Treating Provider/Extender:Roxanna Mcever, Alice Reichert, Lowella Dandy in Treatment: 2 Active Problems ICD-10 Evaluated Encounter Code Description Active Date Today Diagnosis L97.212 Non-pressure chronic ulcer of right calf with fat layer 12/20/2018 No Yes exposed L97.222 Non-pressure chronic ulcer of left calf with fat layer 12/20/2018 No Yes exposed I89.0 Lymphedema, not elsewhere classified 12/20/2018 No Yes E11.9 Type 2 diabetes mellitus without complications 1/0/9323 No Yes Inactive Problems ICD-10 Code Description Active Date Inactive Date L03.115 Cellulitis of right lower limb 01/31/2019 01/31/2019 Resolved  Problems Electronic Signature(s) Signed: 03/31/2019 5:33:39 PM By: Linton Ham MD Entered By: Linton Ham on 03/31/2019 10:06:15 -------------------------------------------------------------------------------- Progress Note Details Patient Name: Date of Service: [name] 03/31/2019 8:45 AM Medical Record [id number] Patient Account Number: [id number] Date of Birth/Sex: Treating RN: 1956/02/13 (63 y.o. [name] Primary Care Provider: Kandice Hams Other Clinician: Referring Provider: Treating Provider/Extender:Kayleeann Huxford, Alice Reichert, Lowella Dandy in Treatment: 2 Subjective History of Present Illness (HPI) 63 year old male with several years history of bilateral lower extremity lymphedema, apparently had been established with wound clinic in 2008, now presents with worsening lower extremity ulcers on both sides. Patient was seen in April by his primary care physician and was prescribed a course of Bactrim for right leg ulcer with cellulitis, he has not been under any form of compression recently. Since April he has noticed gradual worsening of these wounds on both legs. He denies any significant pain, he has been using pads and dressing from over-the- counter purchase to keep the drainage down and he has noticed this has been increasing over the past couple of weeks. He denies any systemic symptoms of fevers chills or shakes. He continues to smoke a pack a day. He does not have a diagnosis of sleep apnea or has never been tested for it. Patient has history of congestive heart failure although latest echo shows preserved ejection fraction and normal chambers, type 2 diabetes on insulin and last known ABIs prior to the clinic in 2015 that were normal ABIs today in the clinic are 1.43 on the left and 1.22 on the right 7/14; patient admitted to the clinic last week. He has longstanding wounds on the left leg about a year and on the right leg for the last 6  months. The area on the left is almost circumferential. He has lymphedema changes of chronic venous insufficiency. Comes in today with leaking edema fluid extremely malodorous. He is going to need to come in for a nurse change on Friday.  7/21; substantial wounds on the left leg and right lateral leg. All of these covered in adherent surface slough. We have been using silver alginate under 4-layer compression 7/28 substantial wounds on the left greater than right leg. The left is on the medial and posterior right on the lateral. Severe chronic venous insufficiency with secondary lymphedema. The patient states he has a history of wounds with the last one was 3 years ago. He does not wear stockings 8/4-Patient returns at 1 week, wounds are slightly improved than last time, we are continuing 4 layer compression 8/11-Patient returns at 1 week after being in 4 layer compression continues to have significant drainage on the right and apparently malodorous drainage on the left from the 2 wounds. We are using silver alginate 8/18; 4 layer compression for large wounds on the right lateral and left medial calfs in the setting of chronic venous insufficiency and secondary lymphedema. A culture was done last week that showed both Proteus and MSSA. I prescribed Augmentin twice daily for 7 days. This will cover any on cultured anaerobes there is apparently a very significant odor last week with purulent looking drainage 8/25; wounds have some surface debris but not much overall. The area on the left measures smaller. There are islands of epithelialization on the right lateral. Been using silver alginate. He is completing his Augmentin 9/1; surface area of the wounds is down, wound surfaces look excellent using Hydrofera Blue under 4-layer compression 9/10-Patient returns at 1 week with both legs in 4 layer compression wraps with Hydrofera Blue. The wound surfaces are continuing to improve with a fair amount of  exudate from the surfaces as expected 9/18; continue with Hydrofera Blue under 4-layer compression making good improvements in both wound areas. We are going to order him bilateral juxta lites. With regards to the compression pumps he did not seem to enamored about the possible cost of this as he does not have a secondary insurance 9/25; the patient has a small wound on the right lateral and left posterior calf. Comes in today with considerable amount of edema in the right calf is wrapped fell down. There is also edema in the left mid calf. Even with our 4- layer compression we really do not have good edema control 10/2; small wound on the right is closed over. May be some non-completely epithelialized areas on the back of the right calf. Left posterior calf not much change although surface looks good. We do not have perfect edema control using Hydrofera Blue Were having issues with external compression garment ordering. Compression pumps are in the works. He will definitely need the latter 10/9; he still does not have the juxta lite stockings. We are in process of ordering external compression pumps I think he will need both of these in order to maintain skin integrity. He does not have an open wound currently on the right leg. The only thing he has on the left is a superficial area on the medial mid calf. 10/16; he has his juxta lite stockings removed without on the right leg today. He also has his compression pumps. We still have an area open on the left leg we have been using Hydrofera Blue and wrapping this and 4-layer Objective Constitutional Patient is hypertensive.. Pulse regular and within target range for patient.Marland Kitchen. Respirations regular, non-labored and within target range.. Temperature is normal and within the target range for the patient.Marland Kitchen. Appears in no distress. Vitals Time Taken: 9:31 AM, Height: 73 in, Weight: 425 lbs,  BMI: 56.1, Temperature: 97.8 F, Pulse: 90 bpm, Respiratory  Rate: 18 breaths/min, Blood Pressure: 160/89 mmHg. Respiratory work of breathing is normal. Cardiovascular Pedal pulses palpable.. General Notes: Wound exam; the only remaining wound is on the left lateral mid calf. Superficial. We have decent edema control bilaterally. Peripheral pulses are palpable Integumentary (Hair, Skin) Severe bilateral hemosiderin deposition secondary to chronic venous insufficiency. Secondary lymphedema. Wound #4 status is Open. Original cause of wound was Gradually Appeared. The wound is located on the Left,Circumferential Lower Leg. The wound measures 2cm length x 1cm width x 0.1cm depth; 1.571cm^2 area and 0.157cm^3 volume. There is Fat Layer (Subcutaneous Tissue) Exposed exposed. There is no tunneling or undermining noted. There is a medium amount of serosanguineous drainage noted. The wound margin is distinct with the outline attached to the wound base. There is large (67-100%) red, pink granulation within the wound bed. There is a small (1-33%) amount of necrotic tissue within the wound bed including Adherent Slough. 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 There was a Four Layer Compression Therapy Procedure by Zandra Abts, RN. Post procedure Diagnosis Wound #4: Same as Pre-Procedure Plan Follow-up Appointments: Return Appointment in 1 week. - Friday Dressing Change Frequency: Wound #4 Left,Circumferential Lower Leg: Do not change entire dressing for one week. Skin Barriers/Peri-Wound Care: Wound #4 Left,Circumferential Lower Leg: Barrier cream - as needed for maceration, wetness, or redness. Moisturizing lotion Wound  Cleansing: Wound #4 Left,Circumferential Lower Leg: May shower with protection. Primary Wound Dressing: Wound #4 Left,Circumferential Lower Leg: Hydrofera Blue Secondary Dressing: Wound #4 Left,Circumferential Lower Leg: ABD pad Edema Control: 4 layer compression: Left lower extremity - use unna layer at top of calf to secure Avoid standing for long periods of time Elevate legs to the level of the heart or above for 30 minutes daily and/or when sitting, a frequency of: - throughout the day. Exercise regularly Support Garment 30-40 mm/Hg pressure to: - Juxtalite to right leg daily Segmental Compressive Device. - lymphadema pumps 60 minutes 2 times per day when available 1. Hydrofera Blue/ABDs/4-layer compression. 2. Juxta lite stocking to the right leg. 3. His compression pumps are being set up Electronic Signature(s) Signed: 03/31/2019 5:33:39 PM By: Baltazar Najjar MD Entered By: Baltazar Najjar on 03/31/2019 10:09:24 -------------------------------------------------------------------------------- SuperBill Details Patient Name: Date of Service: AKIO, HUDNALL 03/31/2019 Medical Record [id number] Patient Account Number: [id number] Date of Birth/Sex: Treating RN: Sep 06, 1955 (63 y.o. [name] Primary Care Provider: [name] Other Clinician: Referring Provider: Treating Provider/Extender:Lekeisha Arenas, [name], [name] in Treatment: 2 Diagnosis Coding ICD-10 Codes Code Description [phone] Non-pressure chronic ulcer of right calf with fat layer exposed L97.222 Non-pressure chronic ulcer of left calf with fat layer exposed I89.0 Lymphedema, not elsewhere classified E11.9 Type 2 diabetes mellitus without complications Facility Procedures CPT4 Code Description: [id number] (Facility Use Only) [phone] - APPLY MULTLAY COMPRS LWR LT LEG Modifier: Quantity: 1 Physician Procedures CPT4 Code: [id number] Description: 99213 - WC PHYS LEVEL 3 - EST PT  ICD-10 Diagnosis Description L97.212 Non-pressure chronic ulcer of right calf with fat la L97.222 Non-pressure chronic ulcer of left calf with fat lay I89.0 Lymphedema, not elsewhere classified Modifier: yer exposed er exposed Quantity: 1 Electronic Signature(s) Signed: 03/31/2019 5:33:39 PM  By: Baltazar Najjar MD Entered By: Baltazar Najjar on 03/31/2019 10:09:44

## 2019-04-07 ENCOUNTER — Other Ambulatory Visit: Payer: Self-pay

## 2019-04-07 ENCOUNTER — Encounter (HOSPITAL_BASED_OUTPATIENT_CLINIC_OR_DEPARTMENT_OTHER): Payer: PPO | Admitting: Internal Medicine

## 2019-04-07 DIAGNOSIS — I890 Lymphedema, not elsewhere classified: Secondary | ICD-10-CM | POA: Diagnosis not present

## 2019-04-07 DIAGNOSIS — E11622 Type 2 diabetes mellitus with other skin ulcer: Secondary | ICD-10-CM | POA: Diagnosis not present

## 2019-04-07 DIAGNOSIS — L97222 Non-pressure chronic ulcer of left calf with fat layer exposed: Secondary | ICD-10-CM | POA: Diagnosis not present

## 2019-04-07 DIAGNOSIS — I872 Venous insufficiency (chronic) (peripheral): Secondary | ICD-10-CM | POA: Diagnosis not present

## 2019-04-07 NOTE — Progress Notes (Signed)
[name], [name] ([id number]) Visit Report for 04/07/2019 HPI Details Patient Name: Date of Service: [name] 04/07/2019 9:00 AM Medical Record [id number] Patient Account Number: [id number] Date of Birth/Sex: Treating RN: May 03, 1956 (63 y.o. [name] Primary Care Provider: [name] Other Clinician: Referring Provider: Treating Provider/Extender:Robson, [name], [name] in Treatment: 3 History of Present Illness HPI Description: 63 year old male with several years history of bilateral lower extremity lymphedema, apparently had been established with wound clinic in 2008, now presents with worsening lower extremity ulcers on both sides. Patient was seen in April by his primary care physician and was prescribed a course of Bactrim for right leg ulcer with cellulitis, he has not been under any form of compression recently. Since April he has noticed gradual worsening of these wounds on both legs. He denies any significant pain, he has been using pads and dressing from over-the-counter purchase to keep the drainage down and he has noticed this has been increasing over the past couple of weeks. He denies any systemic symptoms of fevers chills or shakes. He continues to smoke a pack a day. He does not have a diagnosis of sleep apnea or has never been tested for it. Patient has history of congestive heart failure although latest echo shows preserved ejection fraction and normal chambers, type 2 diabetes on insulin and last known ABIs prior to the clinic in 2015 that were normal ABIs today in the clinic are 1.43 on the left and 1.22 on the right 7/14; patient admitted to the clinic last week. He has longstanding wounds on the left leg about a year and on the right leg for the last 6 months. The area on the left is almost circumferential. He has lymphedema changes of chronic venous insufficiency. Comes in today with leaking edema fluid extremely  malodorous. He is going to need to come in for a nurse change on Friday. 7/21; substantial wounds on the left leg and right lateral leg. All of these covered in adherent surface slough. We have been using silver alginate under 4-layer compression 7/28 substantial wounds on the left greater than right leg. The left is on the medial and posterior right on the lateral. Severe chronic venous insufficiency with secondary lymphedema. The patient states he has a history of wounds with the last one was 3 years ago. He does not wear stockings 8/4-Patient returns at 1 week, wounds are slightly improved than last time, we are continuing 4 layer compression 8/11-Patient returns at 1 week after being in 4 layer compression continues to have significant drainage on the right and apparently malodorous drainage on the left from the 2 wounds. We are using silver alginate 8/18; 4 layer compression for large wounds on the right lateral and left medial calfs in the setting of chronic venous insufficiency and secondary lymphedema. A culture was done last week that showed both Proteus and MSSA. I prescribed Augmentin twice daily for 7 days. This will cover any on cultured anaerobes there is apparently a very significant odor last week with purulent looking drainage 8/25; wounds have some surface debris but not much overall. The area on the left measures smaller. There are islands of epithelialization on the right lateral. Been using silver alginate. He is completing his Augmentin 9/1; surface area of the wounds is down, wound surfaces look excellent using Hydrofera Blue under 4-layer compression 9/10-Patient returns at 1 week with both legs in 4 layer compression wraps with Hydrofera Blue. The wound surfaces are continuing to  improve with a fair amount of exudate from the surfaces as expected 9/18; continue with Hydrofera Blue under 4-layer compression making good improvements in both wound areas. We are going to  order him bilateral juxta lites. With regards to the compression pumps he did not seem to enamored about the possible cost of this as he does not have a secondary insurance 9/25; the patient has a small wound on the right lateral and left posterior calf. Comes in today with considerable amount of edema in the right calf is wrapped fell down. There is also edema in the left mid calf. Even with our 4- layer compression we really do not have good edema control 10/2; small wound on the right is closed over. May be some non-completely epithelialized areas on the back of the right calf. Left posterior calf not much change although surface looks good. We do not have perfect edema control using Hydrofera Blue Were having issues with external compression garment ordering. Compression pumps are in the works. He will definitely need the latter 10/9; he still does not have the juxta lite stockings. We are in process of ordering external compression pumps I think he will need both of these in order to maintain skin integrity. He does not have an open wound currently on the right leg. The only thing he has on the left is a superficial area on the medial mid calf. 10/16; he has his juxta lite stockings removed without on the right leg today. He also has his compression pumps. We still have an area open on the left leg we have been using Hydrofera Blue and wrapping this and 4-layer 10/23; juxta lite stocking on the right leg. He is also at the use his compression pumps. We still have an open area on the medial left leg which is large although it appears that his wrap once again fell down there is localized edema just above the wound. We have been using Hydrofera Blue and 4-layer compression. Electronic Signature(s) Signed: 04/07/2019 6:06:00 PM By: Baltazar Najjarobson, Michael MD Entered By: Baltazar Najjarobson, Michael on 04/07/2019 09:38:30 -------------------------------------------------------------------------------- Physical Exam  Details Patient Name: Date of Service: Matthew ShamsGATTO, Matthew J. 04/07/2019 9:00 AM Medical Record [id number] Patient Account Number: [id number] Date of Birth/Sex: Treating RN: 09/07/1955 (63 y.o. Matthew KollerM) [name], [name] Primary Care Provider: [name], [name] Other Clinician: Referring Provider: Treating Provider/Extender:Robson, Almira CoasterMichael Polite, [name] in Treatment: 3 Constitutional Patient is hypertensive.. Pulse regular and within target range for patient.Marland Kitchen. Respirations regular, non-labored and within target range.. Temperature is normal and within the target range for the patient.Marland Kitchen. Appears in no distress. Eyes Conjunctivae clear. No discharge.no icterus. Respiratory work of breathing is normal. Cardiovascular Pedal pulses palpable on the left. Integumentary (Hair, Skin) No evidence of surrounding infection. Psychiatric appears at normal baseline. Notes Wound exam; the only remaining wound is on the left lateral mid calf which is superficial but larger. Not edema control is not as good today there is considerable swelling above the wound area. Peripheral pulses are palpable there is no surrounding infection Electronic Signature(s) Signed: 04/07/2019 6:06:00 PM By: Baltazar Najjarobson, Michael MD Entered By: Baltazar Najjarobson, Michael on 04/07/2019 09:39:39 -------------------------------------------------------------------------------- Physician Orders Details Patient Name: Date of Service: Matthew ShamsGATTO, Matthew J. 04/07/2019 9:00 AM Medical Record [id number] Patient Account Number: [id number] Date of Birth/Sex: Treating RN: 02/08/1956 (63 y.o. Matthew KollerM) [name], [name] Primary Care Provider: [name], [name] Other Clinician: Referring Provider: Treating Provider/Extender:Robson, Almira CoasterMichael Polite, [name] in Treatment: 3 Verbal / Phone Orders: No Diagnosis Coding ICD-10 Coding  Code Description [phone] Non-pressure chronic ulcer of right calf with fat layer exposed L97.222 Non-pressure chronic  ulcer of left calf with fat layer exposed I89.0 Lymphedema, not elsewhere classified E11.9 Type 2 diabetes mellitus without complications Follow-up Appointments Return Appointment in 2 weeks. Nurse Visit: - 1 week for rewrap Dressing Change Frequency Wound #4 Left,Circumferential Lower Leg Do not change entire dressing for one week. Skin Barriers/Peri-Wound Care Wound #4 Left,Circumferential Lower Leg Barrier cream - as needed for maceration, wetness, or redness. Moisturizing lotion Wound Cleansing Wound #4 Left,Circumferential Lower Leg May shower with protection. Primary Wound Dressing Wound #4 Left,Circumferential Lower Leg Polymem Silver Secondary Dressing Wound #4 Left,Circumferential Lower Leg ABD pad Edema Control 4 layer compression: Left lower extremity - use unna layer at top of calf to secure Avoid standing for long periods of time Elevate legs to the level of the heart or above for 30 minutes daily and/or when sitting, a frequency of: - throughout the day. Exercise regularly Support Garment 30-40 mm/Hg pressure to: - Juxtalite to right leg daily Segmental Compressive Device. - lymphadema pumps 60 minutes 2 times per day when available Electronic Signature(s) Signed: 04/07/2019 5:59:58 PM By: Zandra Abts RN, [name] Signed: 04/07/2019 6:06:00 PM By: Baltazar Najjar MD Entered By: Zandra Abts on 04/07/2019 09:20:29 -------------------------------------------------------------------------------- Problem List Details Patient Name: Date of Service: Matthew, [name] 04/07/2019 9:00 AM Medical Record [id number] Patient Account Number: [id number] Date of Birth/Sex: Treating RN: [birth year] (63 y.o. [name] Primary Care Provider: [name] Other Clinician: Referring Provider: Treating Provider/Extender:Robson, [name], [name] in Treatment: 3 Active Problems ICD-10 Evaluated Encounter Code Description Active Date Today  Diagnosis L97.212 Non-pressure chronic ulcer of right calf with fat layer 12/20/2018 No Yes exposed L97.222 Non-pressure chronic ulcer of left calf with fat layer 12/20/2018 No Yes exposed I89.0 Lymphedema, not elsewhere classified 12/20/2018 No Yes E11.9 Type 2 diabetes mellitus without complications 12/20/2018 No Yes Inactive Problems ICD-10 Code Description Active Date Inactive Date L03.115 Cellulitis of right lower limb 01/31/2019 01/31/2019 Resolved Problems Electronic Signature(s) Signed: 04/07/2019 6:06:00 PM By: Baltazar Najjar MD Entered By: Baltazar Najjar on 04/07/2019 09:37:38 -------------------------------------------------------------------------------- Progress Note Details Patient Name: Date of Service: Matthew Shams 04/07/2019 9:00 AM Medical Record [id number] Patient Account Number: [id number] Date of Birth/Sex: Treating RN: 06-20-1955 (63 y.o. [name] Primary Care Provider: [name] Other Clinician: Referring Provider: Treating Provider/Extender:Robson, [name], [name] in Treatment: 3 Subjective History of Present Illness (HPI) 63 year old male with several years history of bilateral lower extremity lymphedema, apparently had been established with wound clinic in 2008, now presents with worsening lower extremity ulcers on both sides. Patient was seen in April by his primary care physician and was prescribed a course of Bactrim for right leg ulcer with cellulitis, he has not been under any form of compression recently. Since April he has noticed gradual worsening of these wounds on both legs. He denies any significant pain, he has been using pads and dressing from over-the- counter purchase to keep the drainage down and he has noticed this has been increasing over the past couple of weeks. He denies any systemic symptoms of fevers chills or shakes. He continues to smoke a pack a day. He does not have a diagnosis of sleep apnea or  has never been tested for it. Patient has history of congestive heart failure although latest echo shows preserved ejection fraction and normal chambers, type 2 diabetes on insulin and last known ABIs prior to  the clinic in 2015 that were normal ABIs today in the clinic are 1.43 on the left and 1.22 on the right 7/14; patient admitted to the clinic last week. He has longstanding wounds on the left leg about a year and on the right leg for the last 6 months. The area on the left is almost circumferential. He has lymphedema changes of chronic venous insufficiency. Comes in today with leaking edema fluid extremely malodorous. He is going to need to come in for a nurse change on Friday. 7/21; substantial wounds on the left leg and right lateral leg. All of these covered in adherent surface slough. We have been using silver alginate under 4-layer compression 7/28 substantial wounds on the left greater than right leg. The left is on the medial and posterior right on the lateral. Severe chronic venous insufficiency with secondary lymphedema. The patient states he has a history of wounds with the last one was 3 years ago. He does not wear stockings 8/4-Patient returns at 1 week, wounds are slightly improved than last time, we are continuing 4 layer compression 8/11-Patient returns at 1 week after being in 4 layer compression continues to have significant drainage on the right and apparently malodorous drainage on the left from the 2 wounds. We are using silver alginate 8/18; 4 layer compression for large wounds on the right lateral and left medial calfs in the setting of chronic venous insufficiency and secondary lymphedema. A culture was done last week that showed both Proteus and MSSA. I prescribed Augmentin twice daily for 7 days. This will cover any on cultured anaerobes there is apparently a very significant odor last week with purulent looking drainage 8/25; wounds have some surface debris but not  much overall. The area on the left measures smaller. There are islands of epithelialization on the right lateral. Been using silver alginate. He is completing his Augmentin 9/1; surface area of the wounds is down, wound surfaces look excellent using Hydrofera Blue under 4-layer compression 9/10-Patient returns at 1 week with both legs in 4 layer compression wraps with Hydrofera Blue. The wound surfaces are continuing to improve with a fair amount of exudate from the surfaces as expected 9/18; continue with Hydrofera Blue under 4-layer compression making good improvements in both wound areas. We are going to order him bilateral juxta lites. With regards to the compression pumps he did not seem to enamored about the possible cost of this as he does not have a secondary insurance 9/25; the patient has a small wound on the right lateral and left posterior calf. Comes in today with considerable amount of edema in the right calf is wrapped fell down. There is also edema in the left mid calf. Even with our 4- layer compression we really do not have good edema control 10/2; small wound on the right is closed over. May be some non-completely epithelialized areas on the back of the right calf. Left posterior calf not much change although surface looks good. We do not have perfect edema control using Hydrofera Blue Were having issues with external compression garment ordering. Compression pumps are in the works. He will definitely need the latter 10/9; he still does not have the juxta lite stockings. We are in process of ordering external compression pumps I think he will need both of these in order to maintain skin integrity. He does not have an open wound currently on the right leg. The only thing he has on the left is a superficial area on the  medial mid calf. 10/16; he has his juxta lite stockings removed without on the right leg today. He also has his compression pumps. We still have an area open on  the left leg we have been using Hydrofera Blue and wrapping this and 4-layer 10/23; juxta lite stocking on the right leg. He is also at the use his compression pumps. We still have an open area on the medial left leg which is large although it appears that his wrap once again fell down there is localized edema just above the wound. We have been using Hydrofera Blue and 4-layer compression. Objective Constitutional Patient is hypertensive.. Pulse regular and within target range for patient.Marland Kitchen Respirations regular, non-labored and within target range.. Temperature is normal and within the target range for the patient.Marland Kitchen Appears in no distress. Vitals Time Taken: 8:56 AM, Height: 73 in, Weight: 425 lbs, BMI: 56.1, Temperature: 97.7 F, Pulse: 87 bpm, Respiratory Rate: 19 breaths/min, Blood Pressure: 154/87 mmHg. Eyes Conjunctivae clear. No discharge.no icterus. Respiratory work of breathing is normal. Cardiovascular Pedal pulses palpable on the left. Psychiatric appears at normal baseline. General Notes: Wound exam; the only remaining wound is on the left lateral mid calf which is superficial but larger. Not edema control is not as good today there is considerable swelling above the wound area. Peripheral pulses are palpable there is no surrounding infection Integumentary (Hair, Skin) No evidence of surrounding infection. Wound #4 status is Open. Original cause of wound was Gradually Appeared. The wound is located on the Left,Circumferential Lower Leg. The wound measures 2.8cm length x 2.4cm width x 0.1cm depth; 5.278cm^2 area and 0.528cm^3 volume. There is Fat Layer (Subcutaneous Tissue) Exposed exposed. There is no tunneling or undermining noted. There is a medium amount of serosanguineous drainage noted. The wound margin is distinct with the outline attached to the wound base. There is large (67-100%) red, pink granulation within the wound bed. There is a small (1-33%) amount of necrotic  tissue within the wound bed including Adherent Slough. 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 There was a Four Layer Compression Therapy Procedure by Levan Hurst, RN. Post procedure Diagnosis Wound #4: Same as Pre-Procedure Plan Follow-up Appointments: Return Appointment in 2 weeks. Nurse Visit: - 1 week for rewrap Dressing Change Frequency: Wound #4 Left,Circumferential Lower Leg: Do not change entire dressing for one week. Skin Barriers/Peri-Wound Care: Wound #4 Left,Circumferential Lower Leg: Barrier cream - as needed for maceration, wetness, or redness. Moisturizing lotion Wound Cleansing: Wound #4 Left,Circumferential Lower Leg: May shower with protection. Primary Wound Dressing: Wound #4 Left,Circumferential Lower Leg: Polymem Silver Secondary Dressing: Wound #4 Left,Circumferential Lower Leg: ABD pad Edema Control: 4 layer compression: Left lower extremity - use unna layer at top of calf to secure Avoid standing for long periods of time Elevate legs to the level of the heart or above for 30 minutes daily and/or when sitting, a frequency of: - throughout the day. Exercise regularly Support Garment 30-40 mm/Hg pressure to: - Juxtalite to right leg daily Segmental Compressive Device. - lymphadema pumps 60 minutes 2 times per day when available 1. Change the primary dressing to PolyMem Ag 2. Replace the 4-layer compression 3. I have asked him to use his compression pumps twice  a day to both legs over the wraps and juxta lite stocking Electronic Signature(s) Signed: 04/07/2019 6:06:00 PM By: Baltazar Najjar MD Entered By: Baltazar Najjar on  04/07/2019 09:40:31 -------------------------------------------------------------------------------- SuperBill Details Patient Name: Date of Service: Matthew, [name] 04/07/2019 Medical Record [id number] Patient Account Number: [id number] Date of Birth/Sex: Treating RN: [birth year] (63 y.o. [name] Primary Care Provider: [name] Other Clinician: Referring Provider: Treating Provider/Extender:Robson, [name], [name] in Treatment: 3 Diagnosis Coding ICD-10 Codes Code Description [phone] Non-pressure chronic ulcer of right calf with fat layer exposed L97.222 Non-pressure chronic ulcer of left calf with fat layer exposed I89.0 Lymphedema, not elsewhere classified E11.9 Type 2 diabetes mellitus without complications Facility Procedures CPT4 Code Description: [id number] (Facility Use Only) [phone] - APPLY MULTLAY COMPRS LWR LT LEG Modifier: Quantity: 1 Physician Procedures CPT4 Code: [id number] Description: 99213 - WC PHYS LEVEL 3 - EST PT ICD-10 Diagnosis Description L97.222 Non-pressure chronic ulcer of left calf with fat laye I89.0 Lymphedema, not elsewhere classified Modifier: r exposed Quantity: 1 Electronic Signature(s) Signed: 04/07/2019 6:06:00 PM By: Baltazar Najjar MD Entered By: Baltazar Najjar on 04/07/2019 09:41:15

## 2019-04-10 NOTE — Progress Notes (Signed)
NOACH, CALVILLO ([id number]) Visit Report for 04/07/2019 Arrival Information Details Patient Name: Date of Service: [name], [name] 04/07/2019 9:00 AM Medical Record [id number] Patient Account Number: [id number] Date of Birth/Sex: Treating RN: [birth year] (63 y.o. [name] Primary Care Vernica Wachtel: Kandice Hams Other Clinician: Referring Khrystal Jeanmarie: Treating Joron Velis/Extender:Robson, [name], Lowella Dandy in Treatment: 3 Visit Information History Since Last Visit Added or deleted any medications: No Patient Arrived: Ambulatory Any new allergies or adverse reactions: No Arrival Time: 08:53 Had a fall or experienced change in No Accompanied By: self activities of daily living that may affect Transfer Assistance: None risk of falls: Patient Identification Verified: Yes Signs or symptoms of abuse/neglect since last No Secondary Verification Process Completed: Yes visito Patient Requires Transmission-Based No Hospitalized since last visit: No Precautions: Implantable device outside of the clinic excluding No Patient Has Alerts: No cellular tissue based products placed in the center since last visit: Has Dressing in Place as Prescribed: Yes Has Compression in Place as Prescribed: Yes Pain Present Now: No Electronic Signature(s) Signed: 04/10/2019 5:21:50 PM By: Kela Millin Entered By: Kela Millin on 04/07/2019 08:56:18 -------------------------------------------------------------------------------- Compression Therapy Details Patient Name: Date of Service: [name] 04/07/2019 9:00 AM Medical Record [id number] Patient Account Number: [id number] Date of Birth/Sex: Treating RN: Apr 09, 1956 (63 y.o. [name] Primary Care Teleshia Lemere: Kandice Hams Other Clinician: Referring Dael Howland: Treating Leniyah Martell/Extender:Robson, [name], Lowella Dandy in Treatment: 3 Compression Therapy Performed for Wound Wound #4  Left,Circumferential Lower Leg Assessment: Performed By: Clinician Levan Hurst, RN Compression Type: Four Layer Post Procedure Diagnosis Same as Pre-procedure Electronic Signature(s) Signed: 04/07/2019 5:59:58 PM By: Levan Hurst RN, [name] Entered By: Levan Hurst on 04/07/2019 09:21:52 -------------------------------------------------------------------------------- Encounter Discharge Information Details Patient Name: Date of Service: [name] 04/07/2019 9:00 AM Medical Record [id number] Patient Account Number: [id number] Date of Birth/Sex: Treating RN: 02-21-1956 (63 y.o. [name] Primary Care Veatrice Eckstein: Kandice Hams Other Clinician: Referring Zakyria Metzinger: Treating Jadian Karman/Extender:Robson, [name], Lowella Dandy in Treatment: 3 Encounter Discharge Information Items Discharge Condition: Stable Ambulatory Status: Ambulatory Discharge Destination: Home Transportation: Private Auto Accompanied By: self Schedule Follow-up Appointment: Yes Clinical Summary of Care: Patient Declined Electronic Signature(s) Signed: 04/10/2019 5:21:50 PM By: Kela Millin Entered By: Kela Millin on 04/07/2019 09:48:13 -------------------------------------------------------------------------------- Lower Extremity Assessment Details Patient Name: Date of Service: [name], [name] 04/07/2019 9:00 AM Medical Record [id number] Patient Account Number: [id number] Date of Birth/Sex: Treating RN: [birth year] (62 y.o. [name] Primary Care Blaiden Werth: Kandice Hams Other Clinician: Referring Tahje Borawski: Treating Latajah Thuman/Extender:Robson, [name], Ashby Dawes Weeks in Treatment: 3 Edema Assessment Assessed: [Left: No] [Right: No] Edema: [Left: Ye] [Right: s] Calf Left: Right: Point of Measurement: 31 cm From Medial Instep 62 cm cm Ankle Left: Right: Point of Measurement: 11 cm From Medial Instep 29.9 cm cm Vascular  Assessment Pulses: Dorsalis Pedis Palpable: [Left:Yes] Electronic Signature(s) Signed: 04/10/2019 5:21:50 PM By: Kela Millin Entered By: Kela Millin on 04/07/2019 09:01:56 -------------------------------------------------------------------------------- Multi Wound Chart Details Patient Name: Date of Service: [name] 04/07/2019 9:00 AM Medical Record [id number] Patient Account Number: [id number] Date of Birth/Sex: Treating RN: May 18, 1956 (63 y.o. 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] Date Acquired: [4:04/15/2018] [N/A:N/A] Weeks of Treatment: [4:15] [N/A:N/A] Wound Status: [4:Open] [N/A:N/A] Clustered Wound: [4:Yes] [N/A:N/A] Clustered Quantity: [4:2] [N/A:N/A] Measurements L x W x D 2.8x2.4x0.1 [N/A:N/A] (cm) Area (cm) : [4:5.278] [N/A:N/A] Volume (cm) : [4:0.528] [N/A:N/A] % Reduction in Area: [4:98.80%] [N/A:N/A] % Reduction in Volume: [4:98.80%] [N/A:N/A] Classification: [4:Full Thickness Without Exposed Support Structures] [N/A:N/A] Exudate Amount: [4:Medium] [N/A:N/A] Exudate Type: [4:Serosanguineous] [N/A:N/A] Exudate Color: [4:red, brown] [N/A:N/A] Wound Margin: [4:Distinct, outline attached N/A] Granulation Amount: [4:Large (67-100%)] [N/A:N/A] Granulation Quality: [4:Red, Pink] [N/A:N/A] Necrotic Amount: [4:Small (1-33%)] [N/A:N/A] Exposed Structures: [4:Fat  Layer (Subcutaneous N/A Tissue) Exposed: Yes Fascia: No Tendon: No Muscle: No Joint: No Bone: No] Epithelialization: [4:Small (1-33%) Compression Therapy] [N/A:N/A N/A] Treatment Notes Electronic Signature(s) Signed: 04/07/2019 5:59:58 PM By: Zandra Abts RN, [name] Signed: 04/07/2019 6:06:00 PM By: Baltazar Najjar MD Entered By: Baltazar Najjar on 04/07/2019 09:37:49 -------------------------------------------------------------------------------- Multi-Disciplinary Care Plan Details Patient Name: Date of Service: [name] 04/07/2019 9:00 AM Medical Record [id number] Patient Account Number: [id number] Date of Birth/Sex: Treating RN: 09/28/1955 (63 y.o. [name] Primary Care Hani Patnode: [name] Other Clinician: Referring Jesalyn Finazzo: Treating Ahmon Tosi/Extender:Robson, [name], [name] in Treatment: 3 Active Inactive Venous Leg Ulcer Nursing Diagnoses: Knowledge deficit related to disease process and management Potential for venous Insuffiency (use before diagnosis confirmed) Goals: Patient will maintain optimal edema control Date Initiated: 03/03/2019 Target Resolution Date: 04/28/2019 Goal Status: Active Interventions: Compression as ordered Provide education on venous insufficiency Treatment Activities: Therapeutic compression applied : 03/03/2019 Notes: Electronic Signature(s) Signed: 04/07/2019 5:59:58 PM By: Zandra Abts RN, [name] Entered By: Zandra Abts on 04/07/2019 09:10:48 -------------------------------------------------------------------------------- Pain Assessment Details Patient Name: Date of Service: [name] 04/07/2019 9:00 AM Medical Record [id number] Patient Account Number: [id number] Date of Birth/Sex: Treating RN: Jan 09, 1956 (63 y.o. [name] Primary Care Kailan Carmen: [name] Other Clinician: Referring Kayode Petion: Treating Neko Mcgeehan/Extender:Robson, [name], [name] in Treatment: 3 Active Problems Location of Pain Severity and Description of Pain Patient Has Paino No Site Locations Pain Management and Medication Current Pain Management: Electronic Signature(s) Signed: 04/10/2019 5:21:50 PM By: Cherylin Mylar Entered By: Cherylin Mylar on 04/07/2019 08:57:19 -------------------------------------------------------------------------------- Patient/Caregiver Education Details Patient Name: Date of Service: [name] 10/23/2020andnbsp9:00 AM Medical Record [id number] Patient Account Number: [id number] Date of Birth/Gender: Treating RN: [birth year] (62 y.o. [name] Primary Care Physician: [name] Other Clinician: Referring Physician: Treating Physician/Extender:Robson, [name], [name] in Treatment: 3 Education Assessment Education Provided To: Patient Education Topics Provided Venous: Methods: Explain/Verbal Responses: State content correctly Wound/Skin Impairment: Methods: Explain/Verbal Responses: State content correctly Electronic Signature(s) Signed: 04/07/2019 5:59:58 PM By: Zandra Abts RN, [name] Entered By: Zandra Abts on 04/07/2019 09:11:00 -------------------------------------------------------------------------------- Wound Assessment Details Patient Name: Date of Service: [name] 04/07/2019 9:00 AM Medical Record [id number] Patient Account Number: [id number] Date of Birth/Sex: Treating RN: [name], 1956 (62 y.o. [name] Primary Care Peri Kreft: [name] Other Clinician: Referring Ramonica Grigg: Treating Itha Kroeker/Extender:Robson, [name], Deirdre Peer Weeks in Treatment: 3 Wound Status Wound Number: 4 Primary Etiology: Venous Leg Ulcer Wound Location: Left Lower Leg - Circumferential Secondary Diabetic Wound/Ulcer of the Lower Etiology: Extremity Wounding Event: Gradually Appeared Wound Status: Open Date Acquired:  04/15/2018 Weeks Of Treatment: 15 Clustered Wound: Yes Photos Wound Measurements Length: (cm) 2.8 % Red Width: (cm) 2.4 % Red Depth: (cm) 0.1 Epith Clustered Quantity: 2 Tunne Area: (cm)  5.278 Unde Volume: (cm) 0.528 Wound Description Full Thickness Without Exposed Support Classification: Structures Wound Distinct, outline attached Margin: Exudate Medium Amount: Exudate Serosanguineous Type: Exudate red, brown Color: Wound Bed Granulation Amount: Large (67-100%) Granulation Quality: Red, Pink Necrotic Amount: Small (1-33%) Necrotic Quality: Adherent Slough Foul Odor After Cleansing: No Slough/Fibrino Yes Exposed Structure Fascia Exposed: No Fat Layer (Subcutaneous Tissue) Exposed: Yes Tendon Exposed: No Muscle Exposed: No Joint Exposed: No Bone Exposed: No uction in Area: 98.8% uction in Volume: 98.8% elialization: Small (1-33%) ling: No rmining: No Treatment Notes Wound #4 (Left, Circumferential Lower Leg) 1. Cleanse With Wound Cleanser Soap and water 2. Periwound Care Barrier cream Moisturizing lotion 3. Primary Dressing Applied Polymem Ag 4. Secondary Dressing ABD Pad 6. Support Layer Applied 4 layer compression wrap [employer] Notes unna boot to upper lower leg. stockinette instead of stretch net Electronic Signature(s) Signed: 04/10/2019 4:53:58 PM By: Benjaman Kindler EMT/HBOT Signed: 04/10/2019 5:21:50 PM By: Cherylin Mylar Entered By: Benjaman Kindler on 04/10/2019 08:46:30 -------------------------------------------------------------------------------- Vitals Details Patient Name: Date of Service: [name] 04/07/2019 9:00 AM Medical Record [id number] Patient Account Number: [id number] Date of Birth/Sex: Treating RN: 06-Apr-1956 (63 y.o. [name] Primary Care Dilcia Rybarczyk: [name] Other Clinician: Referring Kandis Henry: Treating Chrys Landgrebe/Extender:Robson, [name], [name] in Treatment: 3 Vital  Signs Time Taken: 08:56 Temperature (F): 97.7 Height (in): 73 Pulse (bpm): 87 Weight (lbs): 425 Respiratory Rate (breaths/min): 19 Body Mass Index (BMI): 56.1 Blood Pressure (mmHg): 154/87 Reference Range: 80 - 120 mg / dl Electronic Signature(s) Signed: 04/10/2019 5:21:50 PM By: Cherylin Mylar Entered By: Cherylin Mylar on 04/07/2019 08:57:13

## 2019-04-12 ENCOUNTER — Encounter (HOSPITAL_BASED_OUTPATIENT_CLINIC_OR_DEPARTMENT_OTHER): Payer: PPO | Admitting: Physician Assistant

## 2019-04-12 ENCOUNTER — Other Ambulatory Visit: Payer: Self-pay

## 2019-04-12 DIAGNOSIS — E11622 Type 2 diabetes mellitus with other skin ulcer: Secondary | ICD-10-CM | POA: Diagnosis not present

## 2019-04-19 NOTE — Progress Notes (Signed)
ZYIER, DYKEMA ([id number]) Visit Report for 04/12/2019 SuperBill Details Patient Name: Date of Service: SPURGEON, GANCARZ 04/12/2019 Medical Record [id number] Patient Account Number: [id number] Date of Birth/Sex: Treating RN: [birth year] (63 y.o. 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

## 2019-04-21 ENCOUNTER — Encounter (HOSPITAL_BASED_OUTPATIENT_CLINIC_OR_DEPARTMENT_OTHER): Payer: PPO | Attending: Internal Medicine | Admitting: Internal Medicine

## 2019-04-21 ENCOUNTER — Other Ambulatory Visit: Payer: Self-pay

## 2019-04-21 DIAGNOSIS — L97222 Non-pressure chronic ulcer of left calf with fat layer exposed: Secondary | ICD-10-CM | POA: Diagnosis not present

## 2019-04-21 DIAGNOSIS — I110 Hypertensive heart disease with heart failure: Secondary | ICD-10-CM | POA: Insufficient documentation

## 2019-04-21 DIAGNOSIS — F17200 Nicotine dependence, unspecified, uncomplicated: Secondary | ICD-10-CM | POA: Diagnosis not present

## 2019-04-21 DIAGNOSIS — L97212 Non-pressure chronic ulcer of right calf with fat layer exposed: Secondary | ICD-10-CM | POA: Diagnosis not present

## 2019-04-21 DIAGNOSIS — Z794 Long term (current) use of insulin: Secondary | ICD-10-CM | POA: Diagnosis not present

## 2019-04-21 DIAGNOSIS — E11621 Type 2 diabetes mellitus with foot ulcer: Secondary | ICD-10-CM | POA: Insufficient documentation

## 2019-04-21 DIAGNOSIS — I890 Lymphedema, not elsewhere classified: Secondary | ICD-10-CM | POA: Insufficient documentation

## 2019-04-21 DIAGNOSIS — I872 Venous insufficiency (chronic) (peripheral): Secondary | ICD-10-CM | POA: Insufficient documentation

## 2019-04-21 DIAGNOSIS — I509 Heart failure, unspecified: Secondary | ICD-10-CM | POA: Insufficient documentation

## 2019-04-23 NOTE — Progress Notes (Addendum)
[name], [name] ([id number]) Visit Report for 04/21/2019 Arrival Information Details Patient Name: Date of Service: [name], [name] 04/21/2019 8:00 AM Medical Record [id number] Patient Account Number: [id number] Date of Birth/Sex: Treating RN: [birth year] (63 y.o. Harlon Flor, Millard.Loa Primary Care Gurnie Duris: [name] Other Clinician: Referring Adalynd Donahoe: Treating Tereka Thorley/Extender:Robson, [name], [name] in Treatment: 5 Visit Information History Since Last Visit Added or deleted any medications: No Patient Arrived: Ambulatory Any new allergies or adverse reactions: No Arrival Time: 08:00 Had a fall or experienced change in No Accompanied By: self activities of daily living that may affect Transfer Assistance: None risk of falls: Patient Identification Verified: Yes Signs or symptoms of abuse/neglect since last No Secondary Verification Process Completed: Yes visito Patient Requires Transmission-Based No Hospitalized since last visit: No Precautions: Implantable device outside of the clinic excluding No Patient Has Alerts: No cellular tissue based products placed in the center since last visit: Has Dressing in Place as Prescribed: Yes Has Compression in Place as Prescribed: Yes Pain Present Now: No Electronic Signature(s) Signed: 04/21/2019 5:26:58 PM By: Shawn Stall Entered By: Shawn Stall on 04/21/2019 08:01:19 -------------------------------------------------------------------------------- Compression Therapy Details Patient Name: Date of Service: Matthew, [name] 04/21/2019 8:00 AM Medical Record [id number] Patient Account Number: [id number] Date of Birth/Sex: Treating RN: Aug 17, 1955 (62 y.o. [name] Primary Care Greg Eckrich: [name] Other Clinician: Referring Savier Trickett: Treating Gaila Engebretsen/Extender:Robson, [name], [name] in Treatment: 5 Compression Therapy Performed for Wound Wound #4  Left,Circumferential Lower Leg Assessment: Performed By: Clinician Shawn Stall, RN Compression Type: Four Layer Post Procedure Diagnosis Same as Pre-procedure Electronic Signature(s) Signed: 04/21/2019 6:04:56 PM By: Cherylin Mylar Entered By: Cherylin Mylar on 04/21/2019 08:27:51 -------------------------------------------------------------------------------- Encounter Discharge Information Details Patient Name: Date of Service: Matthew Shams. 04/21/2019 8:00 AM Medical Record [id number] Patient Account Number: [id number] Date of Birth/Sex: Treating RN: 08/29/1955 (62 y.o. Tammy Sours Primary Care Lorik Guo: [name] Other Clinician: Referring Dilia Alemany: Treating Juvenal Umar/Extender:Robson, [name], [name] in Treatment: 5 Encounter Discharge Information Items Discharge Condition: Stable Ambulatory Status: Ambulatory Discharge Destination: Home Transportation: Private Auto Accompanied By: self Schedule Follow-up Appointment: Yes Clinical Summary of Care: Electronic Signature(s) Signed: 04/21/2019 5:26:58 PM By: Shawn Stall Entered By: Shawn Stall on 04/21/2019 08:49:00 -------------------------------------------------------------------------------- Lower Extremity Assessment Details Patient Name: Date of Service: Matthew, [name] 04/21/2019 8:00 AM Medical Record [id number] Patient Account Number: [id number] Date of Birth/Sex: Treating RN: [birth year] (63 y.o. Tammy Sours Primary Care Cloa Bushong: [name] Other Clinician: Referring Lun Muro: Treating Kaaren Nass/Extender:Robson, [name], Deirdre Peer Weeks in Treatment: 5 Edema Assessment Assessed: [Left: Yes] [Right: No] Edema: [Left: Ye] [Right: s] Calf Left: Right: Point of Measurement: 31 cm From Medial Instep 51 cm cm Ankle Left: Right: Point of Measurement: 11 cm From Medial Instep 27.1 cm cm Vascular Assessment Pulses: Dorsalis Pedis Palpable:  [Left:Yes] Electronic Signature(s) Signed: 04/21/2019 5:26:58 PM By: Shawn Stall Entered By: Shawn Stall on 04/21/2019 08:06:37 -------------------------------------------------------------------------------- Multi Wound Chart Details Patient Name: Date of Service: Matthew Shams 04/21/2019 8:00 AM Medical Record [id number] Patient Account Number: [id number] Date of Birth/Sex: Treating RN: January 28, 1956 (63 y.o. 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] Date Acquired: [4:04/15/2018] [N/A:N/A] Weeks of Treatment: [4:17] [N/A:N/A] Wound Status: [4:Open] [N/A:N/A] Clustered Wound: [4:Yes] [N/A:N/A] Clustered Quantity: [4:0] [N/A:N/A] Measurements L x W x D 0.8x0.4x0.1 [N/A:N/A] (cm) Area (cm) : [4:0.251] [N/A:N/A] Volume (cm) : [4:0.025] [N/A:N/A] % Reduction in Area: [4:99.90%] [N/A:N/A] % Reduction in Volume: [4:99.90%] [N/A:N/A] Classification: [4:Full Thickness Without Exposed Support Structures] [N/A:N/A] Exudate Amount: [4:Small] [N/A:N/A] Exudate Type: [4:Serosanguineous] [N/A:N/A] Exudate Color: [4:red, brown] [N/A:N/A] Wound Margin: [4:Flat and Intact] [N/A:N/A] Granulation Amount: [4:Large (67-100%)] [N/A:N/A] Granulation Quality: [4:Red, Pink] [N/A:N/A] Necrotic Amount: [4:None Present (0%)] [N/A:N/A] Exposed Structures: [4:Fat Layer (Subcutaneous N/A  Tissue) Exposed: Yes Fascia: No Tendon: No Muscle: No Joint: No Bone: No] Epithelialization: [4:Large (67-100%) Compression Therapy] [N/A:N/A N/A] Treatment Notes Electronic Signature(s) Signed: 04/21/2019 6:12:06 PM By: Zenaida Deed RN, [name] Signed: 04/23/2019 8:54:13 AM By: Baltazar Najjar MD Entered By: Baltazar Najjar on 04/21/2019 08:27:55 -------------------------------------------------------------------------------- Multi-Disciplinary Care Plan Details Patient Name: Date of Service: Matthew Shams. 04/21/2019 8:00 AM Medical Record [id number] Patient Account Number: [id number] Date of Birth/Sex: Treating RN: 04-16-1956 (63 y.o. Damaris Schooner Primary Care Vyctoria Dickman: [name] Other Clinician: Referring Ceira Hoeschen: Treating Lamyiah Crawshaw/Extender:Robson, [name], [name] in Treatment: 5 Active Inactive Venous Leg Ulcer Nursing Diagnoses: Knowledge deficit related to disease process and management Potential for venous Insuffiency (use before diagnosis confirmed) Goals: Patient will maintain optimal edema control Date Initiated: 03/03/2019 Target Resolution Date: 04/28/2019 Goal Status: Active Interventions: Compression as ordered Provide education on venous insufficiency Treatment Activities: Therapeutic compression applied : 03/03/2019 Notes: Electronic Signature(s) Signed: 04/21/2019 6:12:06 PM By: Zenaida Deed RN, [name] Entered By: Zenaida Deed on 04/21/2019 08:30:41 -------------------------------------------------------------------------------- Pain Assessment Details Patient Name: Date of Service: Matthew Shams 04/21/2019 8:00 AM Medical Record [id number] Patient Account Number: [id number] Date of Birth/Sex: Treating RN: [birth year] (63 y.o. Tammy Sours Primary Care Arnice Vanepps: [name] Other Clinician: Referring Jarrod Mcenery: Treating Angell Honse/Extender:Robson, [name], [name] in Treatment: 5 Active  Problems Location of Pain Severity and Description of Pain Patient Has Paino No Site Locations Rate the pain. Current Pain Level: 0 Pain Management and Medication Current Pain Management: Medication: No Cold Application: No Rest: No Massage: No Activity: No T.E.N.S.: No Heat Application: No Leg drop or elevation: No Is the Current Pain Management Adequate: Adequate How does your wound impact your activities of daily livingo Sleep: No Bathing: No Appetite: No Relationship With Others: No Bladder Continence: No Emotions: No Bowel Continence: No Work: No Toileting: No Drive: No Dressing: No Hobbies: No Electronic Signature(s) Signed: 04/21/2019 5:26:58 PM By: Shawn Stall Entered By: Shawn Stall on 04/21/2019 08:01:33 -------------------------------------------------------------------------------- Patient/Caregiver Education Details Patient Name: Date of Service: Matthew Shams 11/6/2020andnbsp8:00 AM Medical Record [id number] Patient Account Number: [id number] Date of Birth/Gender: Treating RN: 12-06-1955 (63 y.o. Damaris Schooner Primary Care Physician: [name] Other Clinician: Referring Physician: Treating Physician/Extender:Robson, [name], [name] in Treatment: 5 Education Assessment Education Provided To: Patient Education Topics Provided Venous: Handouts: Controlling Swelling with Compression Stockings , Controlling Swelling with Multilayered Compression Wraps Methods: Explain/Verbal Responses: State content correctly Electronic Signature(s) Signed: 04/21/2019 6:12:06 PM By: Zenaida Deed RN, [name] Entered By: Zenaida Deed on 04/21/2019 08:30:57 -------------------------------------------------------------------------------- Wound Assessment Details Patient Name: Date of Service: Matthew Shams 04/21/2019 8:00 AM Medical Record [id number] Patient Account Number: [id number] Date of Birth/Sex: Treating  RN: 12/18/1955 (63 y.o. Tammy Sours Primary Care Kae Lauman: [name]  Other Clinician: Referring Jacarie Pate: Treating Donshay Lupinski/Extender:Robson, Alice Reichert, Ashby Dawes Weeks in Treatment: 5 Wound Status Wound Number: 4 Primary Etiology: Venous Leg Ulcer Wound Location: Left Lower Leg - Circumferential Secondary Diabetic Wound/Ulcer of the Lower Etiology: Extremity Wounding Event: Gradually Appeared Wound Status: Open Date Acquired: 04/15/2018 Weeks Of Treatment: 17 Clustered Wound: Yes Photos Wound Measurements Length: (cm) 0.8 % Reduct Width: (cm) 0.4 % Reduct Depth: (cm) 0.1 Epitheli Clustered Quantity: 0 Tunnelin Area: (cm) 0.251 Undermi Volume: (cm) 0.025 Wound Description Full Thickness Without Exposed Support Foul Odo Classification: Structures Slough/F Wound Flat and Intact Margin: Exudate Small Amount: Exudate Serosanguineous Type: Exudate red, brown Color: Wound Bed Granulation Amount: Large (67-100%) Granulation Quality: Red, Pink Fascia E Necrotic Amount: None Present (0%) Fat Laye Tendon E Muscle E Joint Ex Bone Exp r After Cleansing: No ibrino No Exposed Structure xposed: No r (Subcutaneous Tissue) Exposed: Yes xposed: No xposed: No posed: No osed: No ion in Area: 99.9% ion in Volume: 99.9% alization: Large (67-100%) g: No ning: No Treatment Notes Wound #4 (Left, Circumferential Lower Leg) 1. Cleanse With Wound Cleanser Soap and water 2. Periwound Care Moisturizing lotion 3. Primary Dressing Applied Polymem Ag 4. Secondary Dressing Dry Gauze 6. Support Layer Applied 4 layer compression wrap Notes unna boot to upper lower leg. stockinette instead of stretch net Electronic Signature(s) Signed: 04/26/2019 4:27:20 PM By: Mikeal Hawthorne EMT/HBOT Signed: 04/26/2019 5:42:00 PM By: Deon Pilling Previous Signature: 04/21/2019 5:26:58 PM Version By: Deon Pilling Entered By: Mikeal Hawthorne on 04/26/2019  13:35:02 -------------------------------------------------------------------------------- Vitals Details Patient Name: Date of Service: [name]. 04/21/2019 8:00 AM Medical Record [id number] Patient Account Number: [id number] Date of Birth/Sex: Treating RN: [birth year] (63 y.o. Hessie Diener Primary Care Avari Gelles: Kandice Hams Other Clinician: Referring Marya Lowden: Treating Luevenia Mcavoy/Extender:Robson, Alice Reichert, Lowella Dandy in Treatment: 5 Vital Signs Time Taken: 08:00 Temperature (F): 98.1 Height (in): 73 Pulse (bpm): 81 Weight (lbs): 425 Respiratory Rate (breaths/min): 20 Body Mass Index (BMI): 56.1 Blood Pressure (mmHg): 160/84 Capillary Blood Glucose (mg/dl): 126 Reference Range: 80 - 120 mg / dl Electronic Signature(s) Signed: 04/21/2019 5:26:58 PM By: Deon Pilling Entered By: Deon Pilling on 04/21/2019 08:02:42

## 2019-04-23 NOTE — Progress Notes (Signed)
[name], [name] ([id number]) Visit Report for 04/21/2019 HPI Details Patient Name: Date of Service: [name], [name] 04/21/2019 8:00 AM Medical Record [id number] Patient Account Number: [id number] Date of Birth/Sex: Treating RN: 1956-01-19 (63 y.o. [name] Primary Care Provider: [name] Other Clinician: Referring Provider: Treating Provider/Extender:Dare Sanger, [name], [name] in Treatment: 5 History of Present Illness HPI Description: 63 year old male with several years history of bilateral lower extremity lymphedema, apparently had been established with wound clinic in 2008, now presents with worsening lower extremity ulcers on both sides. Patient was seen in April by his primary care physician and was prescribed a course of Bactrim for [name] leg ulcer with cellulitis, he has not been under any form of compression recently. Since April he has noticed gradual worsening of these wounds on both legs. He denies any significant pain, he has been using pads and dressing from over-the-counter purchase to keep the drainage down and he has noticed this has been increasing over the past couple of weeks. He denies any systemic symptoms of fevers chills or shakes. He continues to smoke a pack a day. He does not have a diagnosis of sleep apnea or has never been tested for it. Patient has history of congestive heart failure although latest echo shows preserved ejection fraction and normal chambers, type 2 diabetes on insulin and last known ABIs prior to the clinic in 2015 that were normal ABIs today in the clinic are 1.43 on the left and 1.22 on the [name] 7/14; patient admitted to the clinic last week. He has longstanding wounds on the left leg about a year and on the [name] leg for the last 6 months. The area on the left is almost circumferential. He has lymphedema changes of chronic venous insufficiency. Comes in today with leaking edema fluid extremely  malodorous. He is going to need to come in for a nurse change on Friday. 7/21; substantial wounds on the left leg and [name] lateral leg. All of these covered in adherent surface slough. We have been using silver alginate under 4-layer compression 7/28 substantial wounds on the left greater than [name] leg. The left is on the medial and posterior [name] on the lateral. Severe chronic venous insufficiency with secondary lymphedema. The patient states he has a history of wounds with the last one was 3 years ago. He does not wear stockings 8/4-Patient returns at 1 week, wounds are slightly improved than last time, we are continuing 4 layer compression 8/11-Patient returns at 1 week after being in 4 layer compression continues to have significant drainage on the [name] and apparently malodorous drainage on the left from the 2 wounds. We are using silver alginate 8/18; 4 layer compression for large wounds on the [name] lateral and left medial calfs in the setting of chronic venous insufficiency and secondary lymphedema. A culture was done last week that showed both Proteus and MSSA. I prescribed Augmentin twice daily for 7 days. This will cover any on cultured anaerobes there is apparently a very significant odor last week with purulent looking drainage 8/25; wounds have some surface debris but not much overall. The area on the left measures smaller. There are islands of epithelialization on the [name] lateral. Been using silver alginate. He is completing his Augmentin 9/1; surface area of the wounds is down, wound surfaces look excellent using Hydrofera Blue under 4-layer compression 9/10-Patient returns at 1 week with both legs in 4 layer compression wraps with Hydrofera Blue. The wound surfaces are continuing to  improve with a fair amount of exudate from the surfaces as expected 9/18; continue with Hydrofera Blue under 4-layer compression making good improvements in both wound areas. We are going to  order him bilateral juxta lites. With regards to the compression pumps he did not seem to enamored about the possible cost of this as he does not have a secondary insurance 9/25; the patient has a small wound on the [name] lateral and left posterior calf. Comes in today with considerable amount of edema in the [name] calf is wrapped fell down. There is also edema in the left mid calf. Even with our 4- layer compression we really do not have good edema control 10/2; small wound on the [name] is closed over. May be some non-completely epithelialized areas on the back of the [name] calf. Left posterior calf not much change although surface looks good. We do not have perfect edema control using Hydrofera Blue Were having issues with external compression garment ordering. Compression pumps are in the works. He will definitely need the latter 10/9; he still does not have the juxta lite stockings. We are in process of ordering external compression pumps I think he will need both of these in order to maintain skin integrity. He does not have an open wound currently on the [name] leg. The only thing he has on the left is a superficial area on the medial mid calf. 10/16; he has his juxta lite stockings removed without on the [name] leg today. He also has his compression pumps. We still have an area open on the left leg we have been using Hydrofera Blue and wrapping this and 4-layer 10/23; juxta lite stocking on the [name] leg. He is also at the use his compression pumps. We still have an open area on the medial left leg which is large although it appears that his wrap once again fell down there is localized edema just above the wound. We have been using Hydrofera Blue and 4-layer compression. 11/6; juxta lite stocking on the [name] leg he also uses his compression pumps twice a day he appears to have nice edema control. Still a very superficial open area on the medial left leg which is all that is left of a  circumferential area of skin breakdown. We have been using polymen since last week and 4-layer compression Electronic Signature(s) Signed: 04/23/2019 8:54:13 AM By: Linton Ham MD Entered By: Linton Ham on 04/21/2019 08:31:14 -------------------------------------------------------------------------------- Physical Exam Details Patient Name: Date of Service: [name] 04/21/2019 8:00 AM Medical Record [id number] Patient Account Number: [id number] Date of Birth/Sex: Treating RN: 1956/04/04 (63 y.o. [name] Primary Care Provider: Kandice Hams Other Clinician: Referring Provider: Treating Provider/Extender:Epimenio Schetter, Alice Reichert, Lowella Dandy in Treatment: 5 Constitutional Patient is hypertensive.. Pulse regular and within target range for patient.Marland Kitchen Respirations regular, non-labored and within target range.. Temperature is normal and within the target range for the patient.Marland Kitchen Appears in no distress. Eyes Conjunctivae clear. No discharge.no icterus. Respiratory work of breathing is normal. Cardiovascular Palpable on the left. Integumentary (Hair, Skin) Hemosiderin deposition. Psychiatric appears at normal baseline. Notes Wound exam; he seems to have an open area on the left medial calf I do not see much else here. Edema control is adequate certainly not ideal peripheral pulses are palpable there is no surrounding infection Electronic Signature(s) Signed: 04/23/2019 8:54:13 AM By: Linton Ham MD Entered By: Linton Ham on 04/21/2019 08:33:41 -------------------------------------------------------------------------------- Physician Orders Details Patient Name: Date of Service: [name]. 04/21/2019 8:00  AM Medical Record [id number] Patient Account Number: [id number] Date of Birth/Sex: Treating RN: 1956/03/11 (63 y.o. 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 Dressing Change Frequency Wound #4 Left,Circumferential Lower Leg Do not change entire dressing for one week. Skin Barriers/Peri-Wound Care Wound #4 Left,Circumferential Lower Leg Barrier cream - as needed for maceration, wetness, or redness. Moisturizing lotion Wound Cleansing Wound #4 Left,Circumferential Lower Leg May shower with protection. Primary Wound Dressing Wound #4 Left,Circumferential Lower Leg Polymem Silver Secondary Dressing Wound #4 Left,Circumferential Lower Leg ABD pad Edema Control 4 layer compression: Left lower extremity - use unna layer at top of calf to secure Avoid standing for long periods of time Elevate legs to the level of the heart or above for 30 minutes daily and/or when sitting, a frequency of: - throughout the day. Exercise regularly Support Garment 30-40 mm/Hg pressure to: - Juxtalite to [name] leg daily Segmental Compressive Device. - lymphadema pumps 60 minutes 2 times per day when available Electronic Signature(s) Signed: 04/21/2019 6:04:56 PM By: Cherylin Mylar Signed: 04/23/2019 8:54:13 AM By: Baltazar Najjar MD Entered By: Cherylin Mylar on 04/21/2019 08:26:55 -------------------------------------------------------------------------------- Problem List Details Patient Name: Date of Service: [name], [name] 04/21/2019 8:00 AM Medical Record [id number] Patient Account Number: [id number] Date of Birth/Sex: Treating RN: 09/27/1955 (63 y.o. [name] Primary Care Provider: [name] Other Clinician: Referring Provider: Treating Provider/Extender:Jared Cahn, [name], [name] in Treatment: 5 Active Problems ICD-10 Evaluated Encounter Code Description Active Date Today Diagnosis [id number] Non-pressure chronic ulcer of [name] calf with fat layer 12/20/2018 No Yes exposed L97.222 Non-pressure chronic ulcer of left calf with fat layer 12/20/2018 No Yes exposed I89.0 Lymphedema, not elsewhere classified 12/20/2018 No Yes E11.9 Type 2 diabetes mellitus without complications 12/20/2018 No Yes Inactive Problems ICD-10 Code Description Active Date Inactive Date L03.115 Cellulitis of [name] lower limb 01/31/2019 01/31/2019 Resolved Problems Electronic Signature(s) Signed: 04/23/2019 8:54:13 AM By: Baltazar Najjar MD Entered By: Baltazar Najjar on 04/21/2019 08:27:47 -------------------------------------------------------------------------------- Progress Note Details Patient Name: Date of Service: [name] 04/21/2019 8:00 AM Medical Record [id number] Patient Account Number: [id number] Date of Birth/Sex: Treating RN: [birth year] (63 y.o. [name] Primary Care Provider: [name] Other Clinician: Referring Provider: Treating Provider/Extender:Kayzen Kendzierski, [name], [name] in Treatment: 5 Subjective History of Present Illness (HPI) 63 year old male with several years history of bilateral lower extremity lymphedema, apparently had been established with wound clinic in 2008, now presents with worsening lower extremity ulcers on both sides. Patient was seen in April by his primary care physician and was prescribed a course of Bactrim for [name] leg ulcer with cellulitis, he has not been under any form of compression recently. Since April he has noticed gradual worsening of these wounds on both legs. He denies any significant pain, he has been using pads and dressing from over-the- counter purchase to keep the drainage down and  he has noticed this has been increasing over the past couple of weeks. He denies any systemic symptoms of fevers chills or shakes. He continues to smoke a pack a day. He does not have a  diagnosis of sleep apnea or has never been tested for it. Patient has history of congestive heart failure although latest echo shows preserved ejection fraction and normal chambers, type 2 diabetes on insulin and last known ABIs prior to the clinic in 2015 that were normal ABIs today in the clinic are 1.43 on the left and 1.22 on the [name] 7/14; patient admitted to the clinic last week. He has longstanding wounds on the left leg about a year and on the [name] leg for the last 6 months. The area on the left is almost circumferential. He has lymphedema changes of chronic venous insufficiency. Comes in today with leaking edema fluid extremely malodorous. He is going to need to come in for a nurse change on Friday. 7/21; substantial wounds on the left leg and [name] lateral leg. All of these covered in adherent surface slough. We have been using silver alginate under 4-layer compression 7/28 substantial wounds on the left greater than [name] leg. The left is on the medial and posterior [name] on the lateral. Severe chronic venous insufficiency with secondary lymphedema. The patient states he has a history of wounds with the last one was 3 years ago. He does not wear stockings 8/4-Patient returns at 1 week, wounds are slightly improved than last time, we are continuing 4 layer compression 8/11-Patient returns at 1 week after being in 4 layer compression continues to have significant drainage on the [name] and apparently malodorous drainage on the left from the 2 wounds. We are using silver alginate 8/18; 4 layer compression for large wounds on the [name] lateral and left medial calfs in the setting of chronic venous insufficiency and secondary lymphedema. A culture was done last week that showed both Proteus and MSSA.  I prescribed Augmentin twice daily for 7 days. This will cover any on cultured anaerobes there is apparently a very significant odor last week with purulent looking drainage 8/25; wounds have some surface debris but not much overall. The area on the left measures smaller. There are islands of epithelialization on the [name] lateral. Been using silver alginate. He is completing his Augmentin 9/1; surface area of the wounds is down, wound surfaces look excellent using Hydrofera Blue under 4-layer compression 9/10-Patient returns at 1 week with both legs in 4 layer compression wraps with Hydrofera Blue. The wound surfaces are continuing to improve with a fair amount of exudate from the surfaces as expected 9/18; continue with Hydrofera Blue under 4-layer compression making good improvements in both wound areas. We are going to order him bilateral juxta lites. With regards to the compression pumps he did not seem to enamored about the possible cost of this as he does not have a secondary insurance 9/25; the patient has a small wound on the [name] lateral and left posterior calf. Comes in today with considerable amount of edema in the [name] calf is wrapped fell down. There is also edema in the left mid calf. Even with our 4- layer compression we really do not have good edema control 10/2; small wound on the [name] is closed over. May be some non-completely epithelialized areas on the back of the [name] calf. Left posterior calf not much change although surface looks good. We do not have perfect edema control using Hydrofera Blue Were having issues with external compression garment ordering. Compression pumps are in the works. He will definitely need the latter 10/9; he still does not have the juxta lite stockings. We are in process of ordering external compression pumps I  think he will need both of these in order to maintain skin integrity. He does not have an open wound currently on the [name] leg. The  only thing he has on the left is a superficial area on the medial mid calf. 10/16; he has his juxta lite stockings removed without on the [name] leg today. He also has his compression pumps. We still have an area open on the left leg we have been using Hydrofera Blue and wrapping this and 4-layer 10/23; juxta lite stocking on the [name] leg. He is also at the use his compression pumps. We still have an open area on the medial left leg which is large although it appears that his wrap once again fell down there is localized edema just above the wound. We have been using Hydrofera Blue and 4-layer compression. 11/6; juxta lite stocking on the [name] leg he also uses his compression pumps twice a day he appears to have nice edema control. Still a very superficial open area on the medial left leg which is all that is left of a circumferential area of skin breakdown. We have been using polymen since last week and 4-layer compression Objective Constitutional Patient is hypertensive.. Pulse regular and within target range for patient.Marland Kitchen. Respirations regular, non-labored and within target range.. Temperature is normal and within the target range for the patient.Marland Kitchen. Appears in no distress. Vitals Time Taken: 8:00 AM, Height: 73 in, Weight: 425 lbs, BMI: 56.1, Temperature: 98.1 F, Pulse: 81 bpm, Respiratory Rate: 20 breaths/min, Blood Pressure: 160/84 mmHg, Capillary Blood Glucose: 126 mg/dl. Eyes Conjunctivae clear. No discharge.no icterus. Respiratory work of breathing is normal. Cardiovascular Palpable on the left. Psychiatric appears at normal baseline. General Notes: Wound exam; he seems to have an open area on the left medial calf I do not see much else here. Edema control is adequate certainly not ideal peripheral pulses are palpable there is no surrounding infection Integumentary (Hair, Skin) Hemosiderin deposition. Wound #4 status is Open. Original cause of wound was Gradually Appeared. The  wound is located on the Left,Circumferential Lower Leg. The wound measures 0.8cm length x 0.4cm width x 0.1cm depth; 0.251cm^2 area and 0.025cm^3 volume. There is Fat Layer (Subcutaneous Tissue) Exposed exposed. There is no tunneling or undermining noted. There is a small amount of serosanguineous drainage noted. The wound margin is flat and intact. There is large (67-100%) red, pink granulation within the wound bed. There is no necrotic tissue within the wound bed. 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 There was a Four Layer Compression Therapy Procedure by Shawn Stalleaton, Bobbi, RN. Post procedure Diagnosis Wound #4: Same as Pre-Procedure Plan Follow-up Appointments: Return Appointment in 1 week. Dressing Change Frequency: Wound #4 Left,Circumferential Lower Leg: Do not change entire dressing for one week. Skin Barriers/Peri-Wound Care: Wound #4 Left,Circumferential Lower Leg: Barrier cream - as needed for maceration, wetness, or redness. Moisturizing lotion Wound Cleansing: Wound #4 Left,Circumferential Lower Leg: May shower with protection. Primary Wound Dressing: Wound #4 Left,Circumferential Lower Leg: Polymem Silver Secondary Dressing: Wound #4 Left,Circumferential Lower Leg: ABD pad Edema Control: 4 layer compression: Left lower extremity - use unna layer at top of calf to secure Avoid standing for long periods of time Elevate legs to the  level of the heart or above for 30 minutes daily and/or when sitting, a frequency of: - throughout the day. Exercise regularly Support Garment 30-40 mm/Hg pressure to: - Juxtalite to [name] leg daily Segmental  Compressive Device. - lymphadema pumps 60 minutes 2 times per day when available 1. I continued with the polymen ABDs under 4-layer compression 2. He has a juxta lite stocking for the left leg 3. It appears to have adequate edema control in the left leg 4. Tolerating juxta light and pumping on the [name] Electronic Signature(s) Signed: 04/23/2019 8:54:13 AM By: Baltazar Najjarobson, Raden Byington MD Entered By: Baltazar Najjarobson, Uriyah Massimo on 04/21/2019 08:34:24 -------------------------------------------------------------------------------- SuperBill Details Patient Name: Date of Service: Matthew ShamsGATTO, Matthew J. 04/21/2019 Medical Record [id number] Patient Account Number: [id number] Date of Birth/Sex: Treating RN: 10/20/1955 (62 y.o. 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 L97.222 Non-pressure chronic ulcer of left calf with fat layer exposed I89.0 Lymphedema, not elsewhere classified E11.9 Type 2 diabetes mellitus without complications Facility Procedures CPT4 Code Description: [id number] (Facility Use Only) [id number] - APPLY MULTLAY COMPRS LWR LT LEG Modifier: Quantity: 1 Physician Procedures CPT4 Code: [id number] Description: 99213 - WC PHYS LEVEL 3 - EST PT ICD-10 Diagnosis Description L97.222 Non-pressure chronic ulcer of left calf with fat laye I89.0 Lymphedema, not elsewhere classified Modifier: r exposed Quantity: 1 Electronic Signature(s) Signed: 04/23/2019 8:54:13 AM By: Baltazar Najjarobson, Demeka Sutter MD Entered By: Baltazar Najjarobson, Lillianne Eick on 04/21/2019 08:34:47

## 2019-04-28 ENCOUNTER — Other Ambulatory Visit: Payer: Self-pay

## 2019-04-28 ENCOUNTER — Encounter (HOSPITAL_BASED_OUTPATIENT_CLINIC_OR_DEPARTMENT_OTHER): Payer: PPO | Admitting: Internal Medicine

## 2019-04-28 DIAGNOSIS — L97222 Non-pressure chronic ulcer of left calf with fat layer exposed: Secondary | ICD-10-CM | POA: Diagnosis not present

## 2019-04-28 DIAGNOSIS — E11621 Type 2 diabetes mellitus with foot ulcer: Secondary | ICD-10-CM | POA: Diagnosis not present

## 2019-04-28 DIAGNOSIS — I872 Venous insufficiency (chronic) (peripheral): Secondary | ICD-10-CM | POA: Diagnosis not present

## 2019-04-28 NOTE — Progress Notes (Addendum)
JAMEAL, [name] ([id number]) Visit Report for 04/28/2019 Debridement Details Patient Name: Date of Service: [name] 04/28/2019 8:00 AM Medical Record [id number] Patient Account Number: [id number] Date of Birth/Sex: 1955/07/14 (63 y.o. M) Treating RN: Zandra Abts Primary Care Provider: [name] Other Clinician: Referring Provider: Treating Provider/Extender:Jaimes Eckert, [name], [name] in Treatment: 6 Debridement Performed for Wound #4 Left,Circumferential Lower Leg Assessment: Performed By: Physician Maxwell Caul., MD Debridement Type: Debridement Severity of Tissue Pre Fat layer exposed Debridement: Level of Consciousness (Pre- Awake and Alert procedure): Pre-procedure Verification/Time Out Taken: Yes - 08:48 Start Time: 08:48 Total Area Debrided (L x W): 0.6 (cm) x 0.3 (cm) = 0.18 (cm) Tissue and other material Viable, Non-Viable, Subcutaneous, Skin: Epidermis, Fibrin/Exudate debrided: Level: Skin/Subcutaneous Tissue Debridement Description: Excisional Instrument: Curette Bleeding: Minimum Hemostasis Achieved: Pressure End Time: 08:49 Response to Treatment: Procedure was tolerated well Level of Consciousness Awake and Alert (Post-procedure): Post Debridement Measurements of Total Wound Length: (cm) 0.6 Width: (cm) 0.3 Depth: (cm) 0.1 Volume: (cm) 0.014 Character of Wound/Ulcer Post Improved Debridement: Severity of Tissue Post Debridement: Fat layer exposed Post Procedure Diagnosis Same as Pre-procedure Electronic Signature(s) Signed: 05/01/2019 5:46:17 PM By: Baltazar Najjar MD Signed: 05/01/2019 5:59:11 PM By: Zandra Abts RN, [name] Previous Signature: 04/28/2019 5:20:59 PM Version By: Cherylin Mylar Previous Signature: 04/28/2019 5:33:43 PM Version By: Baltazar Najjar MD Previous Signature: 04/28/2019 5:33:43 PM Version By: Baltazar Najjar MD Entered By: Zandra Abts on 05/01/2019  14:30:17 -------------------------------------------------------------------------------- HPI Details Patient Name: Date of Service: [name] 04/28/2019 8:00 AM Medical Record [id number] Patient Account Number: [id number] Date of Birth/Sex: Treating RN: 24-Jun-1955 (63 y.o. [name] Primary Care Provider: [name] Other Clinician: Referring Provider: Treating Provider/Extender:Ethridge Sollenberger, [name], [name] in Treatment: 6 History of Present Illness HPI Description: 63 year old male with several years history of bilateral lower extremity lymphedema, apparently had been established with wound clinic in 2008, now presents with worsening lower extremity ulcers on both sides. Patient was seen in April by his primary care physician and was prescribed a course of Bactrim for [name] leg ulcer with cellulitis, he has not been under any form of compression recently. Since April he has noticed gradual worsening of these wounds on both legs. He denies any significant pain, he has been using pads and dressing from over-the-counter purchase to keep the drainage down and he has noticed this has been increasing over the past couple of weeks. He denies any systemic symptoms of fevers chills or shakes. He continues to smoke a pack a day. He does not have a diagnosis of sleep apnea or has never been tested for it. Patient has history of congestive heart failure although latest echo shows preserved ejection fraction and normal chambers, type 2 diabetes on insulin and last known ABIs prior to the clinic in 2015 that were normal ABIs today in the clinic are 1.43 on the left and 1.22 on the [name] 7/14; patient admitted to the clinic last week. He has longstanding wounds on the left leg about a year and on the [name] leg for the last 6 months. The area on the left is almost circumferential. He has lymphedema changes of chronic venous insufficiency. Comes in today with  leaking edema fluid extremely malodorous. He is going to need to come in for a nurse change on Friday. 7/21; substantial wounds on the left leg and [name] lateral leg. All of these covered in adherent surface slough. We have been using silver alginate under  4-layer compression 7/28 substantial wounds on the left greater than [name] leg. The left is on the medial and posterior [name] on the lateral. Severe chronic venous insufficiency with secondary lymphedema. The patient states he has a history of wounds with the last one was 3 years ago. He does not wear stockings 8/4-Patient returns at 1 week, wounds are slightly improved than last time, we are continuing 4 layer compression 8/11-Patient returns at 1 week after being in 4 layer compression continues to have significant drainage on the [name] and apparently malodorous drainage on the left from the 2 wounds. We are using silver alginate 8/18; 4 layer compression for large wounds on the [name] lateral and left medial calfs in the setting of chronic venous insufficiency and secondary lymphedema. A culture was done last week that showed both Proteus and MSSA. I prescribed Augmentin twice daily for 7 days. This will cover any on cultured anaerobes there is apparently a very significant odor last week with purulent looking drainage 8/25; wounds have some surface debris but not much overall. The area on the left measures smaller. There are islands of epithelialization on the [name] lateral. Been using silver alginate. He is completing his Augmentin 9/1; surface area of the wounds is down, wound surfaces look excellent using Hydrofera Blue under 4-layer compression 9/10-Patient returns at 1 week with both legs in 4 layer compression wraps with Hydrofera Blue. The wound surfaces are continuing to improve with a fair amount of exudate from the surfaces as expected 9/18; continue with Hydrofera Blue under 4-layer compression making good improvements in both  wound areas. We are going to order him bilateral juxta lites. With regards to the compression pumps he did not seem to enamored about the possible cost of this as he does not have a secondary insurance 9/25; the patient has a small wound on the [name] lateral and left posterior calf. Comes in today with considerable amount of edema in the [name] calf is wrapped fell down. There is also edema in the left mid calf. Even with our 4- layer compression we really do not have good edema control 10/2; small wound on the [name] is closed over. May be some non-completely epithelialized areas on the back of the [name] calf. Left posterior calf not much change although surface looks good. We do not have perfect edema control using Hydrofera Blue Were having issues with external compression garment ordering. Compression pumps are in the works. He will definitely need the latter 10/9; he still does not have the juxta lite stockings. We are in process of ordering external compression pumps I think he will need both of these in order to maintain skin integrity. He does not have an open wound currently on the [name] leg. The only thing he has on the left is a superficial area on the medial mid calf. 10/16; he has his juxta lite stockings removed without on the [name] leg today. He also has his compression pumps. We still have an area open on the left leg we have been using Hydrofera Blue and wrapping this and 4-layer 10/23; juxta lite stocking on the [name] leg. He is also at the use his compression pumps. We still have an open area on the medial left leg which is large although it appears that his wrap once again fell down there is localized edema just above the wound. We have been using Hydrofera Blue and 4-layer compression. 11/6; juxta lite stocking on the [name] leg he also uses his compression pumps  twice a day he appears to have nice edema control. Still a very superficial open area on the medial left leg which is  all that is left of a circumferential area of skin breakdown. We have been using polymen since last week and 4-layer compression 11/13; juxta lite stocking on the [name] leg. He still has a small area on the medial left calf. We have been using polymen under compression. He claims to be compliant with his compression pumps Electronic Signature(s) Signed: 04/28/2019 5:33:43 PM By: Baltazar Najjarobson, Winda Summerall MD Entered By: Baltazar Najjarobson, Larwence Tu on 04/28/2019 09:46:44 -------------------------------------------------------------------------------- Physical Exam Details Patient Name: Date of Service: [name], Vashaun J. 04/28/2019 8:00 AM Medical Record [id number] Patient Account Number: [id number] Date of Birth/Sex: Treating RN: 01/27/1956 (63 y.o. Matthew RightM) Dwiggins, Shannon Primary Care Provider: [name], [name] Other Clinician: Referring Provider: Treating Provider/Extender:Shanise Balch, Almira CoasterMichael Polite, [name] in Treatment: 6 Constitutional Sitting or standing Blood Pressure is within target range for patient.. Pulse regular and within target range for patient.Marland Kitchen. Respirations regular, non-labored and within target range.. Temperature is normal and within the target range for the patient.Marland Kitchen. Appears in no distress. Notes Wound exam; open area on the left medial calf which is the same as last week. Using a #3 curette flaking skin and debris from the wound edges removed. The surface of the eye wound itself actually look quite good. No debridement was required. His edema control I think should be adequate for healing although it certainly not exemplary Electronic Signature(s) Signed: 04/28/2019 5:33:43 PM By: Baltazar Najjarobson, Dorothie Wah MD Entered By: Baltazar Najjarobson, Kinzi Frediani on 04/28/2019 09:47:32 -------------------------------------------------------------------------------- Physician Orders Details Patient Name: Date of Service: [name], Hector J. 04/28/2019 8:00 AM Medical Record [id number] Patient Account Number:  [id number] Date of Birth/Sex: Treating RN: 04/07/1956 (63 y.o. Matthew RightM) Dwiggins, Shannon Primary Care Provider: [name], [name] Other Clinician: Referring Provider: Treating Provider/Extender:Kanyia Heaslip, Almira CoasterMichael Polite, [name] in Treatment: 6 Verbal / Phone Orders: No Diagnosis Coding Follow-up Appointments Return Appointment in 1 week. Dressing Change Frequency Wound #4 Left,Circumferential Lower Leg Do not change entire dressing for one week. Skin Barriers/Peri-Wound Care Wound #4 Left,Circumferential Lower Leg Barrier cream - as needed for maceration, wetness, or redness. Moisturizing lotion Wound Cleansing Wound #4 Left,Circumferential Lower Leg May shower with protection. Primary Wound Dressing Wound #4 Left,Circumferential Lower Leg Polymem Silver Secondary Dressing Wound #4 Left,Circumferential Lower Leg ABD pad Edema Control 4 layer compression: Left lower extremity - use unna layer at top of calf to secure Avoid standing for long periods of time Elevate legs to the level of the heart or above for 30 minutes daily and/or when sitting, a frequency of: - throughout the day. Exercise regularly Support Garment 30-40 mm/Hg pressure to: - Juxtalite to [name] leg daily Segmental Compressive Device. - lymphadema pumps 60 minutes 2 times per day when available Electronic Signature(s) Signed: 04/28/2019 5:20:59 PM By: Cherylin Mylarwiggins, Shannon Signed: 04/28/2019 5:33:43 PM By: Baltazar Najjarobson, Erian Lariviere MD Entered By: Cherylin Mylarwiggins, Shannon on 04/28/2019 07:58:34 -------------------------------------------------------------------------------- Problem List Details Patient Name: Date of Service: [name], Larz J. 04/28/2019 8:00 AM Medical Record [id number] Patient Account Number: [id number] Date of Birth/Sex: Treating RN: 04/08/1956 (63 y.o. 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 12/20/2018 No Yes exposed L97.222 Non-pressure chronic ulcer of left calf with fat layer 12/20/2018 No Yes exposed I89.0 Lymphedema,  not elsewhere classified 12/20/2018 No Yes E11.9 Type 2 diabetes mellitus without complications 12/20/2018 No Yes Inactive Problems ICD-10 Code Description Active Date Inactive Date L03.115 Cellulitis of [name] lower limb 01/31/2019 01/31/2019 Resolved Problems Electronic Signature(s) Signed: 04/28/2019 5:33:43 PM By: Baltazar Najjar MD Entered By: Baltazar Najjar on 04/28/2019 09:45:47 -------------------------------------------------------------------------------- Progress Note Details Patient Name: Date of Service: [name] 04/28/2019 8:00 AM Medical Record [id number] Patient Account Number: [id number] Date of Birth/Sex: Treating RN: February 27, 1956 (63 y.o. [name] Primary Care Provider: [name] Other Clinician: Referring Provider: Treating Provider/Extender:Majestic Molony, [name], [name] in Treatment: 6 Subjective History of Present Illness (HPI) 63 year old male with several years history of bilateral lower extremity lymphedema, apparently had been established with wound clinic in 2008, now presents with worsening lower extremity ulcers on both sides. Patient was seen in April by his primary care physician and was prescribed a course of Bactrim for [name] leg ulcer with cellulitis, he has not been under any form of compression recently. Since April he has noticed gradual worsening of these wounds on both legs. He denies any significant pain, he has been using pads and dressing from over-the- counter purchase to keep the drainage down and he has noticed this has been increasing over the past couple of weeks. He denies any systemic  symptoms of fevers chills or shakes. He continues to smoke a pack a day. He does not have a diagnosis of sleep apnea or has never been tested for it. Patient has history of congestive heart failure although latest echo shows preserved ejection fraction and normal chambers, type 2 diabetes on insulin and last known ABIs prior to the clinic in 2015 that were normal ABIs today in the clinic are 1.43 on the left and 1.22 on the [name] 7/14; patient admitted to the clinic last week. He has longstanding wounds on the left leg about a year and on the [name] leg for the last 6 months. The area on the left is almost circumferential. He has lymphedema changes of chronic venous insufficiency. Comes in today with leaking edema fluid extremely malodorous. He is going to need to come in for a nurse change on Friday. 7/21; substantial wounds on the left leg and [name] lateral leg. All of these covered in adherent surface slough. We have been using silver alginate under 4-layer compression 7/28 substantial wounds on the left greater than [name] leg. The left is on the medial and posterior [name] on the lateral. Severe chronic venous insufficiency with secondary lymphedema. The patient states he has a history of wounds with the last one was 3 years ago. He does not wear stockings 8/4-Patient returns at 1 week, wounds are slightly improved than last time, we are continuing 4 layer compression 8/11-Patient returns at 1 week after being in 4 layer compression continues to have significant drainage on the [name] and apparently malodorous drainage on the left from the 2 wounds. We are using silver alginate 8/18; 4 layer compression for large wounds on the [name] lateral and left medial calfs in the setting of chronic venous insufficiency and secondary lymphedema. A culture was done last week that showed both Proteus and MSSA. I prescribed Augmentin twice daily for 7 days. This will cover any on cultured anaerobes there is  apparently a very significant odor last week with purulent looking drainage 8/25; wounds have some surface debris but not much overall. The area on the left measures smaller. There are islands of epithelialization on the [name] lateral. Been  using silver alginate. He is completing his Augmentin 9/1; surface area of the wounds is down, wound surfaces look excellent using Hydrofera Blue under 4-layer compression 9/10-Patient returns at 1 week with both legs in 4 layer compression wraps with Hydrofera Blue. The wound surfaces are continuing to improve with a fair amount of exudate from the surfaces as expected 9/18; continue with Hydrofera Blue under 4-layer compression making good improvements in both wound areas. We are going to order him bilateral juxta lites. With regards to the compression pumps he did not seem to enamored about the possible cost of this as he does not have a secondary insurance 9/25; the patient has a small wound on the [name] lateral and left posterior calf. Comes in today with considerable amount of edema in the [name] calf is wrapped fell down. There is also edema in the left mid calf. Even with our 4- layer compression we really do not have good edema control 10/2; small wound on the [name] is closed over. May be some non-completely epithelialized areas on the back of the [name] calf. Left posterior calf not much change although surface looks good. We do not have perfect edema control using Hydrofera Blue Were having issues with external compression garment ordering. Compression pumps are in the works. He will definitely need the latter 10/9; he still does not have the juxta lite stockings. We are in process of ordering external compression pumps I think he will need both of these in order to maintain skin integrity. He does not have an open wound currently on the [name] leg. The only thing he has on the left is a superficial area on the medial mid calf. 10/16; he has his  juxta lite stockings removed without on the [name] leg today. He also has his compression pumps. We still have an area open on the left leg we have been using Hydrofera Blue and wrapping this and 4-layer 10/23; juxta lite stocking on the [name] leg. He is also at the use his compression pumps. We still have an open area on the medial left leg which is large although it appears that his wrap once again fell down there is localized edema just above the wound. We have been using Hydrofera Blue and 4-layer compression. 11/6; juxta lite stocking on the [name] leg he also uses his compression pumps twice a day he appears to have nice edema control. Still a very superficial open area on the medial left leg which is all that is left of a circumferential area of skin breakdown. We have been using polymen since last week and 4-layer compression 11/13; juxta lite stocking on the [name] leg. He still has a small area on the medial left calf. We have been using polymen under compression. He claims to be compliant with his compression pumps Objective Constitutional Sitting or standing Blood Pressure is within target range for patient.. Pulse regular and within target range for patient.Marland Kitchen Respirations regular, non-labored and within target range.. Temperature is normal and within the target range for the patient.Marland Kitchen Appears in no distress. Vitals Time Taken: 8:02 AM, Height: 73 in, Weight: 425 lbs, BMI: 56.1, Temperature: 98.4 F, Pulse: 95 bpm, Respiratory Rate: 20 breaths/min, Blood Pressure: 139/66 mmHg, Capillary Blood Glucose: 126 mg/dl. General Notes: Wound exam; open area on the left medial calf which is the same as last week. Using a #3 curette flaking skin and debris from the wound edges removed. The surface of the eye wound itself actually look quite good. No  debridement was required. His edema control I think should be adequate for healing although it certainly not exemplary Integumentary (Hair,  Skin) Wound #4 status is Open. Original cause of wound was Gradually Appeared. The wound is located on the Left,Circumferential Lower Leg. The wound measures 0.6cm length x 0.3cm width x 0.1cm depth; 0.141cm^2 area and 0.014cm^3 volume. There is Fat Layer (Subcutaneous Tissue) Exposed exposed. There is no tunneling or undermining noted. There is a small amount of serosanguineous drainage noted. The wound margin is flat and intact. There is large (67-100%) red, pink granulation within the wound bed. There is no necrotic tissue within the wound bed. 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 There was a Excisional Skin/Subcutaneous Tissue Debridement with a total area of 0.18 sq cm performed by Maxwell Caulobson, Koty Anctil G., MD. With the following instrument(s): Curette to remove Viable and Non-Viable tissue/material. Material removed includes Subcutaneous Tissue, Skin: Epidermis, and Fibrin/Exudate. No specimens were taken. A time out was conducted at 08:48, prior to the start of the procedure. A Minimum amount of bleeding was controlled with Pressure. The procedure was tolerated well. Post Debridement Measurements: 0.6cm length x 0.3cm width x 0.1cm depth; 0.014cm^3 volume. Character of Wound/Ulcer Post Debridement is improved. Severity of Tissue Post Debridement is: Fat layer exposed. Post procedure Diagnosis Wound #4: Same as Pre-Procedure Pre-procedure diagnosis of Wound #4 is a Venous Leg Ulcer located on the Left,Circumferential Lower Leg . There was a Four Layer Compression Therapy Procedure by Shawn Stalleaton, Bobbi,  RN. Post procedure Diagnosis Wound #4: Same as Pre-Procedure Plan Follow-up Appointments: Return Appointment in 1 week. Dressing Change Frequency: Wound #4 Left,Circumferential Lower Leg: Do not change entire dressing for one week. Skin Barriers/Peri-Wound Care: Wound #4 Left,Circumferential Lower Leg: Barrier cream - as needed for maceration, wetness, or redness. Moisturizing lotion Wound Cleansing: Wound #4 Left,Circumferential Lower Leg: May shower with protection. Primary Wound Dressing: Wound #4 Left,Circumferential Lower Leg: Polymem Silver Secondary Dressing: Wound #4 Left,Circumferential Lower Leg: ABD pad Edema Control: 4 layer compression: Left lower extremity - use unna layer at top of calf to secure Avoid standing for long periods of time Elevate legs to the level of the heart or above for 30 minutes daily and/or when sitting, a frequency of: - throughout the day. Exercise regularly Support Garment 30-40 mm/Hg pressure to: - Juxtalite to [name] leg daily Segmental Compressive Device. - lymphadema pumps 60 minutes 2 times per day when available 1. Continue with polymenooABD under 4-layer compression 2. If this is not closed by next week we will have to change the primary dressing 3. His edema control should be adequate to heal this. I do not see any evidence of infection Electronic Signature(s) Signed: 05/01/2019 5:46:17 PM By: Baltazar Najjarobson, Kiarah Eckstein MD Signed: 05/01/2019 5:59:11 PM By: Zandra AbtsLynch, [name] RN, [name] Previous Signature: 04/28/2019 5:33:43 PM Version By: Baltazar Najjarobson, Tameshia Bonneville MD Entered By: Zandra AbtsLynch, [name] on 05/01/2019 14:30:28 -------------------------------------------------------------------------------- SuperBill Details Patient Name: Date of Service: [name], Raybon J. 04/28/2019 Medical Record [id number] Patient Account Number: [id number] Date of Birth/Sex: Treating RN: 01/20/1956 (63 y.o. 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 L97.222 Non-pressure chronic ulcer of left calf with fat layer exposed I89.0 Lymphedema, not elsewhere classified E11.9 Type 2 diabetes mellitus without complications Facility Procedures CPT4 Code: 91478295 Description: 11042 - DEB SUBQ TISSUE 20 SQ CM/< ICD-10 Diagnosis Description L97.222 Non-pressure chronic ulcer of left calf with fat layer Modifier: exposed Quantity: 1 Physician Procedures CPT4 Code: 6213086 Description: 11042 - WC PHYS SUBQ TISS 20 SQ CM ICD-10 Diagnosis Description L97.222 Non-pressure chronic ulcer of left calf with fat layer Modifier: exposed Quantity: 1 Electronic Signature(s) Signed: 04/28/2019 5:33:43 PM By: Baltazar Najjar MD Entered By: Baltazar Najjar on 04/28/2019 09:48:25

## 2019-05-05 ENCOUNTER — Other Ambulatory Visit: Payer: Self-pay

## 2019-05-05 ENCOUNTER — Encounter (HOSPITAL_BASED_OUTPATIENT_CLINIC_OR_DEPARTMENT_OTHER): Payer: PPO | Admitting: Internal Medicine

## 2019-05-05 DIAGNOSIS — I890 Lymphedema, not elsewhere classified: Secondary | ICD-10-CM | POA: Diagnosis not present

## 2019-05-05 DIAGNOSIS — L97222 Non-pressure chronic ulcer of left calf with fat layer exposed: Secondary | ICD-10-CM | POA: Diagnosis not present

## 2019-05-05 DIAGNOSIS — E11621 Type 2 diabetes mellitus with foot ulcer: Secondary | ICD-10-CM | POA: Diagnosis not present

## 2019-05-08 NOTE — Progress Notes (Signed)
NYHEEM, BINETTE ([id number]) Visit Report for 05/05/2019 HPI Details Patient Name: Date of Service: [name], [name] 05/05/2019 8:00 AM Medical Record [id number] Patient Account Number: [id number] Date of Birth/Sex: Treating RN: 1956/06/06 (63 y.o. Matthew Right Primary Care Provider: [name] Other Clinician: Referring Provider: Treating Provider/Extender:Hart Haas, [name], [name] in Treatment: 7 History of Present Illness HPI Description: 63 year old male with several years history of bilateral lower extremity lymphedema, apparently had been established with wound clinic in 2008, now presents with worsening lower extremity ulcers on both sides. Patient was seen in April by his primary care physician and was prescribed a course of Bactrim for right leg ulcer with cellulitis, he has not been under any form of compression recently. Since April he has noticed gradual worsening of these wounds on both legs. He denies any significant pain, he has been using pads and dressing from over-the-counter purchase to keep the drainage down and he has noticed this has been increasing over the past couple of weeks. He denies any systemic symptoms of fevers chills or shakes. He continues to smoke a pack a day. He does not have a diagnosis of sleep apnea or has never been tested for it. Patient has history of congestive heart failure although latest echo shows preserved ejection fraction and normal chambers, type 2 diabetes on insulin and last known ABIs prior to the clinic in 2015 that were normal ABIs today in the clinic are 1.43 on the left and 1.22 on the right 7/14; patient admitted to the clinic last week. He has longstanding wounds on the left leg about a year and on the right leg for the last 6 months. The area on the left is almost circumferential. He has lymphedema changes of chronic venous insufficiency. Comes in today with leaking edema fluid extremely  malodorous. He is going to need to come in for a nurse change on Friday. 7/21; substantial wounds on the left leg and right lateral leg. All of these covered in adherent surface slough. We have been using silver alginate under 4-layer compression 7/28 substantial wounds on the left greater than right leg. The left is on the medial and posterior right on the lateral. Severe chronic venous insufficiency with secondary lymphedema. The patient states he has a history of wounds with the last one was 3 years ago. He does not wear stockings 8/4-Patient returns at 1 week, wounds are slightly improved than last time, we are continuing 4 layer compression 8/11-Patient returns at 1 week after being in 4 layer compression continues to have significant drainage on the right and apparently malodorous drainage on the left from the 2 wounds. We are using silver alginate 8/18; 4 layer compression for large wounds on the right lateral and left medial calfs in the setting of chronic venous insufficiency and secondary lymphedema. A culture was done last week that showed both Proteus and MSSA. I prescribed Augmentin twice daily for 7 days. This will cover any on cultured anaerobes there is apparently a very significant odor last week with purulent looking drainage 8/25; wounds have some surface debris but not much overall. The area on the left measures smaller. There are islands of epithelialization on the right lateral. Been using silver alginate. He is completing his Augmentin 9/1; surface area of the wounds is down, wound surfaces look excellent using Hydrofera Blue under 4-layer compression 9/10-Patient returns at 1 week with both legs in 4 layer compression wraps with Hydrofera Blue. The wound surfaces are continuing to  improve with a fair amount of exudate from the surfaces as expected 9/18; continue with Hydrofera Blue under 4-layer compression making good improvements in both wound areas. We are going to  order him bilateral juxta lites. With regards to the compression pumps he did not seem to enamored about the possible cost of this as he does not have a secondary insurance 9/25; the patient has a small wound on the right lateral and left posterior calf. Comes in today with considerable amount of edema in the right calf is wrapped fell down. There is also edema in the left mid calf. Even with our 4- layer compression we really do not have good edema control 10/2; small wound on the right is closed over. May be some non-completely epithelialized areas on the back of the right calf. Left posterior calf not much change although surface looks good. We do not have perfect edema control using Hydrofera Blue Were having issues with external compression garment ordering. Compression pumps are in the works. He will definitely need the latter 10/9; he still does not have the juxta lite stockings. We are in process of ordering external compression pumps I think he will need both of these in order to maintain skin integrity. He does not have an open wound currently on the right leg. The only thing he has on the left is a superficial area on the medial mid calf. 10/16; he has his juxta lite stockings removed without on the right leg today. He also has his compression pumps. We still have an area open on the left leg we have been using Hydrofera Blue and wrapping this and 4-layer 10/23; juxta lite stocking on the right leg. He is also at the use his compression pumps. We still have an open area on the medial left leg which is large although it appears that his wrap once again fell down there is localized edema just above the wound. We have been using Hydrofera Blue and 4-layer compression. 11/6; juxta lite stocking on the right leg he also uses his compression pumps twice a day he appears to have nice edema control. Still a very superficial open area on the medial left leg which is all that is left of a  circumferential area of skin breakdown. We have been using polymen since last week and 4-layer compression 11/13; juxta lite stocking on the right leg. He still has a small area on the medial left calf. We have been using polymen under compression. He claims to be compliant with his compression pumps 11/20; juxta lite stockings on the right leg. Everything is closed on the medial and posterior left calf although this area is going to be difficult for him. Skin here is dry. He has juxta lite stockings for the left leg he is also compliant with compression pumps twice a day. Electronic Signature(s) Signed: 05/05/2019 6:05:38 PM By: Linton Ham MD Entered By: Linton Ham on 05/05/2019 09:04:47 -------------------------------------------------------------------------------- Physical Exam Details Patient Name: Date of Service: [name], [name] 05/05/2019 8:00 AM Medical Record [id number] Patient Account Number: [id number] Date of Birth/Sex: Treating RN: April 23, 1956 (63 y.o. [name] Primary Care Provider: Kandice Hams Other Clinician: Referring Provider: Treating Provider/Extender:Ladaisha Portillo, Alice Reichert, Lowella Dandy in Treatment: 7 Constitutional Sitting or standing Blood Pressure is within target range for patient.. Pulse regular and within target range for patient.Marland Kitchen Respirations regular, non-labored and within target range.. Temperature is normal and within the target range for the patient.Marland Kitchen Appears in no distress. Eyes Conjunctivae  clear. No discharge.no icterus. Respiratory work of breathing is normal. Cardiovascular Pedal pulses palpable and strong bilaterally.. Edema control is acceptable. Integumentary (Hair, Skin) Skin is dry and flaking. There is severe hemosiderin deposition and secondary lymphedema.Marland Kitchen Psychiatric appears at normal baseline. Notes Wound exam; open area on the left medial calf from last week is closed. The rest of the skin here  looks satisfactory there is still dry flaking skin and debris but no open wound. His edema control/lymphedema looks quite good. Electronic Signature(s) Signed: 05/05/2019 6:05:38 PM By: Baltazar Najjar MD Entered By: Baltazar Najjar on 05/05/2019 09:06:29 -------------------------------------------------------------------------------- Physician Orders Details Patient Name: Date of Service: TRYNT, PICKUS 05/05/2019 8:00 AM Medical Record [id number] Patient Account Number: [id number] Date of Birth/Sex: Treating RN: Dec 24, 1955 (63 y.o. 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 Exercise regularly Support Garment 30-40 mm/Hg pressure to: - Juxtalites Segmental Compressive Device. - lymphadema pumps 60 minutes 2 times per day when available Electronic Signature(s) Signed: 05/05/2019 6:05:38 PM By: Baltazar Najjar MD Signed: 05/08/2019 2:17:56 PM By: Cherylin Mylar Entered By: Cherylin Mylar on 05/05/2019 08:53:16 -------------------------------------------------------------------------------- Problem List Details Patient Name: Date of Service: [name], [name] 05/05/2019 8:00 AM Medical Record [id number] Patient Account Number:  [id number] Date of Birth/Sex: Treating RN: 11/22/1955 (63 y.o. Matthew Right Primary Care Provider: [name] Other Clinician: Referring Provider: Treating Provider/Extender:Cantrell Martus, [name], [name] in Treatment: 7 Active Problems ICD-10 Evaluated Encounter Code Description Active Date Today Diagnosis L97.212 Non-pressure chronic ulcer of right calf with fat layer 12/20/2018 No Yes exposed L97.222 Non-pressure chronic ulcer of left calf with fat layer 12/20/2018 No Yes exposed I89.0 Lymphedema, not elsewhere classified 12/20/2018 No Yes E11.9 Type 2 diabetes mellitus without complications 12/20/2018 No Yes Inactive Problems ICD-10 Code Description Active Date Inactive Date L03.115 Cellulitis of right lower limb 01/31/2019 01/31/2019 Resolved Problems Electronic Signature(s) Signed: 05/05/2019 6:05:38 PM By: Baltazar Najjar MD Entered By: Baltazar Najjar on 05/05/2019 09:03:54 -------------------------------------------------------------------------------- Progress Note Details Patient Name: Date of Service: Katheren [name] 05/05/2019 8:00 AM Medical Record [id number] Patient Account Number: [id number] Date of Birth/Sex: Treating RN: 1956-03-19 (63 y.o. Matthew Right Primary Care Provider: [name] Other Clinician: Referring Provider: Treating Provider/Extender:Janiel Derhammer, [name], [name] in Treatment: 7 Subjective History of Present Illness (HPI) 63 year old male with several years history of bilateral lower extremity lymphedema, apparently had been established with wound clinic in 2008, now presents with worsening lower extremity ulcers on both sides. Patient was seen in April by his primary care physician and was prescribed a course of Bactrim for right leg ulcer with cellulitis, he has not been under any form of compression recently. Since April he has noticed gradual worsening of these wounds on both legs. He denies  any significant pain, he has been using pads and dressing from over-the- counter purchase to keep the  drainage down and he has noticed this has been increasing over the past couple of weeks. He denies any systemic symptoms of fevers chills or shakes. He continues to smoke a pack a day. He does not have a diagnosis of sleep apnea or has never been tested for it. Patient has history of congestive heart failure although latest echo shows preserved ejection fraction and normal chambers, type 2 diabetes on insulin and last known ABIs prior to the clinic in 2015 that were normal ABIs today in the clinic are 1.43 on the left and 1.22 on the right 7/14; patient admitted to the clinic last week. He has longstanding wounds on the left leg about a year and on the right leg for the last 6 months. The area on the left is almost circumferential. He has lymphedema changes of chronic venous insufficiency. Comes in today with leaking edema fluid extremely malodorous. He is going to need to come in for a nurse change on Friday. 7/21; substantial wounds on the left leg and right lateral leg. All of these covered in adherent surface slough. We have been using silver alginate under 4-layer compression 7/28 substantial wounds on the left greater than right leg. The left is on the medial and posterior right on the lateral. Severe chronic venous insufficiency with secondary lymphedema. The patient states he has a history of wounds with the last one was 3 years ago. He does not wear stockings 8/4-Patient returns at 1 week, wounds are slightly improved than last time, we are continuing 4 layer compression 8/11-Patient returns at 1 week after being in 4 layer compression continues to have significant drainage on the right and apparently malodorous drainage on the left from the 2 wounds. We are using silver alginate 8/18; 4 layer compression for large wounds on the right lateral and left medial calfs in the setting of  chronic venous insufficiency and secondary lymphedema. A culture was done last week that showed both Proteus and MSSA. I prescribed Augmentin twice daily for 7 days. This will cover any on cultured anaerobes there is apparently a very significant odor last week with purulent looking drainage 8/25; wounds have some surface debris but not much overall. The area on the left measures smaller. There are islands of epithelialization on the right lateral. Been using silver alginate. He is completing his Augmentin 9/1; surface area of the wounds is down, wound surfaces look excellent using Hydrofera Blue under 4-layer compression 9/10-Patient returns at 1 week with both legs in 4 layer compression wraps with Hydrofera Blue. The wound surfaces are continuing to improve with a fair amount of exudate from the surfaces as expected 9/18; continue with Hydrofera Blue under 4-layer compression making good improvements in both wound areas. We are going to order him bilateral juxta lites. With regards to the compression pumps he did not seem to enamored about the possible cost of this as he does not have a secondary insurance 9/25; the patient has a small wound on the right lateral and left posterior calf. Comes in today with considerable amount of edema in the right calf is wrapped fell down. There is also edema in the left mid calf. Even with our 4- layer compression we really do not have good edema control 10/2; small wound on the right is closed over. May be some non-completely epithelialized areas on the back of the right calf. Left posterior calf not much change although surface looks good. We do not have perfect edema control using Hydrofera Blue Were  having issues with external compression garment ordering. Compression pumps are in the works. He will definitely need the latter 10/9; he still does not have the juxta lite stockings. We are in process of ordering external compression pumps I think he will  need both of these in order to maintain skin integrity. He does not have an open wound currently on the right leg. The only thing he has on the left is a superficial area on the medial mid calf. 10/16; he has his juxta lite stockings removed without on the right leg today. He also has his compression pumps. We still have an area open on the left leg we have been using Hydrofera Blue and wrapping this and 4-layer 10/23; juxta lite stocking on the right leg. He is also at the use his compression pumps. We still have an open area on the medial left leg which is large although it appears that his wrap once again fell down there is localized edema just above the wound. We have been using Hydrofera Blue and 4-layer compression. 11/6; juxta lite stocking on the right leg he also uses his compression pumps twice a day he appears to have nice edema control. Still a very superficial open area on the medial left leg which is all that is left of a circumferential area of skin breakdown. We have been using polymen since last week and 4-layer compression 11/13; juxta lite stocking on the right leg. He still has a small area on the medial left calf. We have been using polymen under compression. He claims to be compliant with his compression pumps 11/20; juxta lite stockings on the right leg. Everything is closed on the medial and posterior left calf although this area is going to be difficult for him. Skin here is dry. He has juxta lite stockings for the left leg he is also compliant with compression pumps twice a day. Objective Constitutional Sitting or standing Blood Pressure is within target range for patient.. Pulse regular and within target range for patient.Marland Kitchen. Respirations regular, non-labored and within target range.. Temperature is normal and within the target range for the patient.Marland Kitchen. Appears in no distress. Vitals Time Taken: 8:08 AM, Height: 73 in, Weight: 425 lbs, BMI: 56.1, Temperature: 97.9 F,  Pulse: 85 bpm, Respiratory Rate: 18 breaths/min, Blood Pressure: 134/79 mmHg. Eyes Conjunctivae clear. No discharge.no icterus. Respiratory work of breathing is normal. Cardiovascular Pedal pulses palpable and strong bilaterally.. Edema control is acceptable. Psychiatric appears at normal baseline. General Notes: Wound exam; open area on the left medial calf from last week is closed. The rest of the skin here looks satisfactory there is still dry flaking skin and debris but no open wound. His edema control/lymphedema looks quite good. Integumentary (Hair, Skin) Skin is dry and flaking. There is severe hemosiderin deposition and secondary lymphedema.. Wound #4 status is Open. Original cause of wound was Gradually Appeared. The wound is located on the Left,Circumferential Lower Leg. The wound measures 0cm length x 0cm width x 0cm depth; 0cm^2 area and 0cm^3 volume. There is Fat Layer (Subcutaneous Tissue) Exposed exposed. There is no tunneling or undermining noted. There is a small amount of serosanguineous drainage noted. The wound margin is flat and intact. There is large (67- 100%) red, pink granulation within the wound bed. There is no necrotic tissue within the wound bed. 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 Exercise regularly Support Garment 30-40 mm/Hg pressure to: - Juxtalites Segmental Compressive Device. - lymphadema pumps 60 minutes 2 times per day when available 1. I have  changed changed him to juxta lites emphasizing that the compression needs to be in the 30/40 mmHg range 2. He will need to lubricate his skin 3. Pumping twice a day and he appears to be compliant. 4. He can be discharged from the clinic Electronic Signature(s) Signed: 05/05/2019 6:05:38 PM By: Baltazar Najjar MD Entered By: Baltazar Najjar on 05/05/2019 09:07:41 -------------------------------------------------------------------------------- SuperBill Details Patient Name: Date of Service: [name], [name] 05/05/2019 Medical Record [id number] Patient Account Number: [id number] Date of Birth/Sex: Treating RN: [birth year] (63 y.o. 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

## 2019-05-23 NOTE — Progress Notes (Signed)
CLARON, ROSENCRANS ([id number]) Visit Report for 05/05/2019 Arrival Information Details Patient Name: Date of Service: [name], [name] 05/05/2019 8:00 AM Medical Record [id number] Patient Account Number: [id number] Date of Birth/Sex: Treating RN: 08/12/1955 (62 y.o. [name]) Yevonne Pax Primary Care Amil Moseman: [name] Other Clinician: Referring Antonetta Clanton: Treating Alanys Godino/Extender:Robson, [name], [name] in Treatment: 7 Visit Information History Since Last Visit All ordered tests and consults were completed: No Patient Arrived: Ambulatory Added or deleted any medications: No Arrival Time: 08:06 Any new allergies or adverse reactions: No Accompanied By: self Had a fall or experienced change in No Transfer Assistance: None activities of daily living that may affect Patient Identification Verified: Yes risk of falls: Secondary Verification Process Completed: Yes Signs or symptoms of abuse/neglect since last No Patient Requires Transmission-Based No visito Precautions: Hospitalized since last visit: No Patient Has Alerts: No Implantable device outside of the clinic excluding No cellular tissue based products placed in the center since last visit: Has Dressing in Place as Prescribed: Yes Has Compression in Place as Prescribed: Yes Pain Present Now: No Electronic Signature(s) Signed: 05/23/2019 2:50:47 PM By: Yevonne Pax RN Entered By: Yevonne Pax on 05/05/2019 08:08:19 -------------------------------------------------------------------------------- Clinic Level of Care Assessment Details Patient Name: Date of Service: [name], [name] 05/05/2019 8:00 AM Medical Record [id number] Patient Account Number: [id number] Date of Birth/Sex: Treating RN: 1956-01-29 (63 y.o. [name] Primary Care Elisha Mcgruder: [name] Other Clinician: Referring Kerolos Nehme: Treating Johanna Matto/Extender:Robson, [name], [name] in Treatment:  7 Clinic Level of Care Assessment Items TOOL 4 Quantity Score X - Use when only an EandM is performed on FOLLOW-UP visit 1 0 ASSESSMENTS - Nursing Assessment / Reassessment X - Reassessment of Co-morbidities (includes updates in patient status) 1 10 X - Reassessment of Adherence to Treatment Plan 1 5 ASSESSMENTS - Wound and Skin Assessment / Reassessment X - Simple Wound Assessment / Reassessment - one wound 1 5 []  - Complex Wound Assessment / Reassessment - multiple wounds 0 []  - Dermatologic / Skin Assessment (not related to wound area) 0 ASSESSMENTS - Focused Assessment X - Circumferential Edema Measurements - multi extremities 1 5 []  - Nutritional Assessment / Counseling / Intervention 0 []  - Lower Extremity Assessment (monofilament, tuning fork, pulses) 0 []  - Peripheral Arterial Disease Assessment (using hand held doppler) 0 ASSESSMENTS - Ostomy and/or Continence Assessment and Care []  - Incontinence Assessment and Management 0 []  - Ostomy Care Assessment and Management (repouching, etc.) 0 PROCESS - Coordination of Care X - Simple Patient / Family Education for ongoing care 1 15 []  - Complex (extensive) Patient / Family Education for ongoing care 0 X - Staff obtains , Records, Test Results / Process Orders 1 10 []  - Staff telephones HHA, Nursing Homes / Clarify orders / etc 0 []  - Routine Transfer to another Facility (non-emergent condition) 0 []  - Routine Hospital Admission (non-emergent condition) 0 []  - New Admissions / / Ordering NPWT, Apligraf, etc. 0 []  - Emergency Hospital Admission (emergent condition) 0 X - Simple Discharge Coordination 1 10 []  - Complex (extensive) Discharge Coordination 0 PROCESS - Special Needs []  - Pediatric / Minor Patient Management 0 []  - Isolation Patient Management 0 []  - Hearing / Language / Visual special needs 0 []  - Assessment of Community assistance (transportation, [name]/C planning, etc.) 0 []  - Additional  assistance / Altered mentation 0 []  - Support Surface(s) Assessment (bed, cushion, seat, etc.) 0 INTERVENTIONS - Wound Cleansing / Measurement X - Simple Wound  Cleansing - one wound 1 5 []  - Complex Wound Cleansing - multiple wounds 0 X - Wound Imaging (photographs - any number of wounds) 1 5 []  - Wound Tracing (instead of photographs) 0 X - Simple Wound Measurement - one wound 1 5 []  - Complex Wound Measurement - multiple wounds 0 INTERVENTIONS - Wound Dressings []  - Small Wound Dressing one or multiple wounds 0 []  - Medium Wound Dressing one or multiple wounds 0 []  - Large Wound Dressing one or multiple wounds 0 []  - Application of Medications - topical 0 []  - Application of Medications - injection 0 INTERVENTIONS - Miscellaneous []  - External ear exam 0 []  - Specimen Collection (cultures, biopsies, blood, body fluids, etc.) 0 []  - Specimen(s) / Culture(s) sent or taken to Lab for analysis 0 []  - Patient Transfer (multiple staff / Nurse, adultHoyer Lift / Similar devices) 0 []  - Simple Staple / Suture removal (25 or less) 0 []  - Complex Staple / Suture removal (26 or more) 0 []  - Hypo / Hyperglycemic Management (close monitor of Blood Glucose) 0 []  - Ankle / Brachial Index (ABI) - do not check if billed separately 0 X - Vital Signs 1 5 Has the patient been seen at the hospital within the last three years: Yes Total Score: 80 Level Of Care: New/Established - Level 3 Electronic Signature(s) Signed: 05/08/2019 2:17:56 PM By: Cherylin Mylarwiggins, Shannon Entered By: Cherylin Mylarwiggins, Shannon on 05/05/2019 08:54:49 -------------------------------------------------------------------------------- Encounter Discharge Information Details Patient Name: Date of Service: [name], Matthew J. 05/05/2019 8:00 AM Medical Record [id number] Patient Account Number: [id number] Date of Birth/Sex: Treating RN: 04/12/1956 (63 y.o. [name]) Deaton, [name] Primary Care Marrissa Dai: [name], [name] Other Clinician: Referring Brittany Osier:  Treating Zaylan Kissoon/Extender:Robson, Almira CoasterMichael Polite, Chalmers Guestonald [name] Weeks in Treatment: 7 Encounter Discharge Information Items Discharge Condition: Stable Ambulatory Status: Ambulatory Discharge Destination: Home Transportation: Private Auto Accompanied By: self Schedule Follow-up Appointment: No Clinical Summary of Care: Notes demonstrated and educated patient on how to apply juxtalites. Patient in agreement. Electronic Signature(s) Signed: 05/05/2019 6:01:02 PM By: Shawn Stalleaton, [name] Entered By: Shawn Stalleaton, [name] on 05/05/2019 09:21:24 -------------------------------------------------------------------------------- Lower Extremity Assessment Details Patient Name: Date of Service: [name], Matthew J. 05/05/2019 8:00 AM Medical Record [id number] Patient Account Number: [id number] Date of Birth/Sex: Treating RN: 12/23/1955 (62 y.o. Matthew PetitM) Yevonne PaxEpps, Carrie Primary Care Shearon Clonch: [name], [name] Other Clinician: Referring Sierrah Luevano: Treating Dossie Ocanas/Extender:Robson, Almira CoasterMichael Polite, Deirdre Peeronald [name] Weeks in Treatment: 7 Edema Assessment Assessed: [Left: No] [[name]: No] Edema: [Left: Ye] [[name]: s] Calf Left: [name]: Point of Measurement: 31 cm From Medial Instep 52 cm cm Ankle Left: [name]: Point of Measurement: 11 cm From Medial Instep 29 cm cm Electronic Signature(s) Signed: 05/23/2019 2:50:47 PM By: Yevonne PaxEpps, Carrie RN Entered By: Yevonne PaxEpps, Carrie on 05/05/2019 08:17:04 -------------------------------------------------------------------------------- Multi Wound Chart Details Patient Name: Date of Service: [name], Matthew J. 05/05/2019 8:00 AM Medical Record [id number] Patient Account Number: [id number] Date of Birth/Sex: Treating RN: 08/27/1955 (63 y.o. 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] [N/A:N/A] 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] Date Acquired: [4:04/15/2018] [N/A:N/A] Weeks of Treatment: [4:19] [N/A:N/A] Wound Status: [4:Open] [N/A:N/A] Clustered Wound: [4:Yes] [N/A:N/A] Clustered Quantity: [4:0] [N/A:N/A] Measurements L x W x  [name] 0x0x0 [N/A:N/A] (cm) Area (cm) : [4:0] [N/A:N/A] Volume (cm) : [4:0] [N/A:N/A] % Reduction in Area: [4:100.00%] [N/A:N/A] % Reduction in Volume: 100.00% [N/A:N/A] Classification: [4:Full Thickness Without Exposed Support Structures] [N/A:N/A] Exudate Amount: [4:Small] [N/A:N/A] Exudate Type: [4:Serosanguineous] [N/A:N/A] Exudate Color: [4:red, brown] [N/A:N/A] Wound Margin: [4:Flat and Intact] [N/A:N/A] Granulation Amount: [4:Large (67-100%)] [N/A:N/A] Granulation Quality: [4:Red, Pink] [N/A:N/A] Necrotic Amount: [4:None Present (0%)] [N/A:N/A] Exposed Structures: [4:Fat Layer (Subcutaneous N/A Tissue) Exposed: Yes Fascia: No Tendon: No Muscle: No Joint: No Bone: No Large (67-100%)] [N/A:N/A] Treatment Notes Electronic Signature(s) Signed: 05/05/2019 6:05:38 PM By: Baltazar Najjar MD Signed: 05/08/2019 2:17:56 PM By: Cherylin Mylar Entered By: Baltazar Najjar on 05/05/2019 09:04:01 -------------------------------------------------------------------------------- Multi-Disciplinary Care Plan Details Patient Name: Date of Service: Matthew Shams. 05/05/2019 8:00 AM Medical Record [id number] Patient Account Number: [id number] Date of Birth/Sex: Treating RN: 06-22-1955 (63 y.o. [name] Primary Care Elverna Caffee: [name] Other Clinician: Referring Kasy Iannacone: Treating  Takyla Kuchera/Extender:Robson, [name], [name] in Treatment: 7 Active Inactive Electronic Signature(s) Signed: 05/08/2019 2:17:56 PM By: Cherylin Mylar Entered By: Cherylin Mylar on 05/05/2019 08:53:53 -------------------------------------------------------------------------------- Pain Assessment Details Patient Name: Date of Service: [name], [name] 05/05/2019 8:00 AM Medical Record [id number] Patient Account Number: [id number] Date of Birth/Sex: Treating RN: [birth year] (62 y.o. [name]) Yevonne Pax Primary Care Megham Dwyer: [name] Other Clinician: Referring Yamilett Anastos: Treating Rutha Melgoza/Extender:Robson, [name], Deirdre Peer Weeks in Treatment: 7 Active Problems Location of Pain Severity and Description of Pain Patient Has Paino No Site Locations Pain Management and Medication Current Pain Management: Electronic Signature(s) Signed: 05/23/2019 2:50:47 PM By: Yevonne Pax RN Entered By: Yevonne Pax on 05/05/2019 08:10:13 -------------------------------------------------------------------------------- Patient/Caregiver Education Details Patient Name: Date of Service: Matthew Shams 11/20/2020andnbsp8:00 AM Medical Record [phone] Patient Account Number: [id number] Date of Birth/Gender: Treating RN: [birth year] (63 y.o. [name] Primary Care Physician: [name] Other Clinician: Referring Physician: Treating Physician/Extender:Robson, [name], [name] in Treatment: 7 Education Assessment Education Provided To: Patient Education Topics Provided Venous: Handouts: Controlling Swelling with Compression Stockings Methods: Explain/Verbal Responses: See progress note Electronic Signature(s) Signed: 05/08/2019 2:17:56 PM By: Cherylin Mylar Entered By: Cherylin Mylar on 05/05/2019 08:54:11 -------------------------------------------------------------------------------- Wound Assessment  Details Patient Name: Date of Service: Matthew Shams 05/05/2019 8:00 AM Medical Record [id number] Patient Account Number: [id number] Date of Birth/Sex: Treating RN: 04-23-1956 (62 y.o. [name]) Yevonne Pax Primary Care Jonette Wassel: [name] Other Clinician: Referring Reni Hausner: Treating Seibert Keeter/Extender:Robson, [name], Deirdre Peer Weeks in Treatment: 7 Wound Status Wound Number: 4 Primary Etiology: Venous Leg Ulcer Wound Location: Left Lower Leg - Circumferential Secondary Diabetic Wound/Ulcer of the Lower Etiology: Extremity Wounding Event: Gradually Appeared Wound Status: Healed - Epithelialized Date Acquired: 04/15/2018 Weeks Of Treatment: 19 Clustered Wound: Yes Photos Wound Measurements Length: (cm) 0 % Reduct Width: (cm) 0 % Reduct Depth: (cm) 0 Epitheli Clustered Quantity: 0 Tunnelin Area: (cm) 0 Undermi Volume: (cm) 0 Wound Description Classification: Full Thickness Without Exposed Support Foul Od Structures Slough/ Wound Flat and Intact Margin: Exudate Small Amount: Exudate Serosanguineous Type: Exudate red, brown Color: Wound Bed Granulation Amount: Large (67-100%) Granulation Quality: Red, Pink Fascia Necrotic Amount: None Present (0%) Fat Lay Tendon Muscle Joint E Bone Expose or After Cleansing: No Fibrino No Exposed Structure Exposed: No er (Subcutaneous Tissue) Exposed: Yes Exposed: No Exposed: No xposed: No [name]: No ion in Area: 100% ion in Volume: 100% alization: Large (67-100%) g: No ning: No Electronic Signature(s) Signed: 05/09/2019 7:37:04 AM By: Benjaman Kindler EMT/HBOT Signed: 05/23/2019  2:50:47 PM By: [name] RN Entered By: Mikeal Hawthorne on 05/08/2019 07:54:09 -------------------------------------------------------------------------------- Vitals Details Patient Name: Date of Service: [name] 05/05/2019 8:00 AM Medical Record [id number] Patient Account Number: [id number] Date of  Birth/Sex: Treating RN: January 15, 1956 (62 y.o. [name]) [name] Primary Care Taquila Leys: Kandice Hams Other Clinician: Referring Eylin Pontarelli: Treating Marquisa Salih/Extender:Robson, Alice Reichert, Lowella Dandy in Treatment: 7 Vital Signs Time Taken: 08:08 Temperature (F): 97.9 Height (in): 73 Pulse (bpm): 85 Weight (lbs): 425 Respiratory Rate (breaths/min): 18 Body Mass Index (BMI): 56.1 Blood Pressure (mmHg): 134/79 Reference Range: 80 - 120 mg / dl Electronic Signature(s) Signed: 05/23/2019 2:50:47 PM By: [name] RN Entered By: [name] on 05/05/2019 08:10:03

## 2019-05-23 NOTE — Progress Notes (Signed)
JERAMIE, SUGDEN ([id number]) Visit Report for 03/31/2019 Arrival Information Details Patient Name: Date of Service: JULIA, DEC 03/31/2019 8:45 AM Medical Record [id number] Patient Account Number: [id number] Date of Birth/Sex: Treating RN: 1956-03-23 (62 y.o. [name]) Yevonne Pax Primary Care Katalin Colledge: [name] Other Clinician: Referring Bev Drennen: Treating Ivie Savitt/Extender:Robson, [name], [name] in Treatment: 2 Visit Information History Since Last Visit All ordered tests and consults were completed: No Patient Arrived: Ambulatory Added or deleted any medications: No Arrival Time: 09:18 Any new allergies or adverse reactions: No Accompanied By: self Had a fall or experienced change in No Transfer Assistance: None activities of daily living that may affect Patient Identification Verified: Yes risk of falls: Secondary Verification Process Completed: Yes Signs or symptoms of abuse/neglect since last No Patient Requires Transmission-Based No visito Precautions: Hospitalized since last visit: No Patient Has Alerts: No Implantable device outside of the clinic excluding No cellular tissue based products placed in the center since last visit: Has Dressing in Place as Prescribed: Yes Has Compression in Place as Prescribed: Yes Pain Present Now: No Electronic Signature(s) Signed: 05/23/2019 3:01:15 PM By: Yevonne Pax RN Entered By: Yevonne Pax on 03/31/2019 09:31:17 -------------------------------------------------------------------------------- Compression Therapy Details Patient Name: Date of Service: DAKOTTA, SCHOEFF 03/31/2019 8:45 AM Medical Record [id number] Patient Account Number: [id number] Date of Birth/Sex: Treating RN: June 04, 1956 (63 y.o. [name] Primary Care Sabreena Vogan: [name] Other Clinician: Referring Raye Wiens: Treating Maela Takeda/Extender:Robson, [name], [name] in Treatment: 2 Compression  Therapy Performed for Wound Wound #4 Left,Circumferential Lower Leg Assessment: Performed By: Clinician Zandra Abts, RN Compression Type: Four Layer Post Procedure Diagnosis Same as Pre-procedure Electronic Signature(s) Signed: 03/31/2019 6:00:55 PM By: Zandra Abts RN, [name] Entered By: Zandra Abts on 03/31/2019 09:59:36 -------------------------------------------------------------------------------- Encounter Discharge Information Details Patient Name: Date of Service: MORDCHA, BURDETT 03/31/2019 8:45 AM Medical Record [id number] Patient Account Number: [id number] Date of Birth/Sex: Treating RN: 1956-02-12 (63 y.o. [name] Primary Care Tej Murdaugh: [name] Other Clinician: Referring Milla Wahlberg: Treating Arvada Seaborn/Extender:Robson, [name], [name] in Treatment: 2 Encounter Discharge Information Items Discharge Condition: Stable Ambulatory Status: Ambulatory Discharge Destination: Home Transportation: Private Auto Accompanied By: self Schedule Follow-up Appointment: Yes Clinical Summary of Care: Patient Declined Electronic Signature(s) Signed: 04/04/2019 6:16:57 PM By: Cherylin Mylar Entered By: Cherylin Mylar on 03/31/2019 10:36:00 -------------------------------------------------------------------------------- Lower Extremity Assessment Details Patient Name: Date of Service: SUMEDH, CARELOCK 03/31/2019 8:45 AM Medical Record [id number] Patient Account Number: [id number] Date of Birth/Sex: Treating RN: [birth year] (62 y.o. [name]) Yevonne Pax Primary Care Maziah Keeling: [name] Other Clinician: Referring Nathanuel Cabreja: Treating Davante Gerke/Extender:Robson, [name], Deirdre Peer Weeks in Treatment: 2 Edema Assessment Assessed: [Left: No] [Right: No] Edema: [Left: Yes] [Right: Yes] Calf Left: Right: Point of Measurement: 31 cm From Medial Instep 53 cm cm Ankle Left: Right: Point of Measurement: 11 cm From Medial Instep  30 cm cm Electronic Signature(s) Signed: 05/23/2019 3:01:15 PM By: Yevonne Pax RN Entered By: Yevonne Pax on 03/31/2019 09:33:38 -------------------------------------------------------------------------------- Multi Wound Chart Details Patient Name: Date of Service: [name] 03/31/2019 8:45 AM Medical Record [id number] Patient Account Number: [id number] Date of Birth/Sex: Treating RN: 1955-11-25 (63 y.o. 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] Date Acquired: [4:04/15/2018] [N/A:N/A] Weeks of Treatment: [4:14] [N/A:N/A] Wound Status: [4:Open] [N/A:N/A] Clustered Wound: [4:Yes] [N/A:N/A] Clustered Quantity: [4:3] [N/A:N/A] Measurements L x W x D 2x1x0.1 [N/A:N/A] (cm) Area (cm) : [4:1.571] [N/A:N/A] Volume (cm) : [4:0.157] [N/A:N/A] % Reduction in Area: [4:99.60%] [N/A:N/A] % Reduction in Volume: 99.60% [N/A:N/A] Classification: [4:Full Thickness Without Exposed Support Structures] [N/A:N/A] Exudate Amount: [4:Medium] [N/A:N/A] Exudate Type: [4:Serosanguineous] [N/A:N/A] Exudate Color: [4:red, brown] [N/A:N/A] Wound Margin: [4:Distinct, outline attached] [N/A:N/A] Granulation Amount: [4:Large (67-100%)] [N/A:N/A] Granulation Quality: [4:Red, Pink] [N/A:N/A] Necrotic Amount: [4:Small (1-33%)] [N/A:N/A] Exposed Structures: [4:Fat Layer (Subcutaneous Tissue) Exposed: Yes Fascia: No Tendon: No  Muscle: No Joint: No Bone: No] [N/A:N/A] Epithelialization: [4:Medium (34-66%) Compression Therapy] [N/A:N/A N/A] Treatment Notes Electronic Signature(s) Signed: 03/31/2019 5:33:39 PM By: Baltazar Najjar MD Signed: 03/31/2019 6:00:55 PM By: Zandra Abts RN, [name] Entered By: Baltazar Najjar on 03/31/2019 10:06:22 -------------------------------------------------------------------------------- Multi-Disciplinary Care Plan Details Patient Name: Date of Service: XXAVIER, NOON 03/31/2019 8:45 AM Medical Record [id number] Patient Account Number: [id number] Date of Birth/Sex: Treating RN: 1955/10/12 (63 y.o. [name] Primary Care Willodene Stallings: [name] Other Clinician: Referring Kenyen Candy: Treating Ilda Laskin/Extender:Robson, [name], [name] in Treatment: 2 Active Inactive Venous Leg Ulcer Nursing Diagnoses: Knowledge deficit related to disease process and management Potential for venous Insuffiency (use before diagnosis confirmed) Goals: Patient will maintain optimal edema control Date Initiated: 03/03/2019 Target Resolution Date: 04/28/2019 Goal Status: Active Interventions: Compression as ordered Provide education on venous insufficiency Treatment Activities: Therapeutic compression applied : 03/03/2019 Notes: Electronic Signature(s) Signed: 03/31/2019 6:00:55 PM By: Zandra Abts RN, [name] Entered By: Zandra Abts on 03/31/2019 09:24:22 -------------------------------------------------------------------------------- Pain Assessment Details Patient Name: Date of Service: [name] 03/31/2019 8:45 AM Medical Record [id number] Patient Account Number: [id number] Date of Birth/Sex: Treating RN: Jul 08, 1955 (62 y.o. [name] Primary Care Noal Abshier: [name] Other Clinician: Referring Tyshay Adee: Treating Sharvil Hoey/Extender:Robson, [name], Deirdre Peer Weeks in Treatment: 2 Active Problems Location of Pain  Severity and Description of Pain Patient Has Paino No Site Locations Pain Management and Medication Current Pain Management: Electronic Signature(s) Signed: 05/23/2019 3:01:15 PM By: Yevonne Pax RN Entered By: Yevonne Pax on 03/31/2019 09:33:13 -------------------------------------------------------------------------------- Patient/Caregiver Education Details Patient Name: Date of Service: [name] 10/16/2020andnbsp8:45 AM Medical Record [id number] Patient Account Number: [id number] Date of Birth/Gender: 01-03-1956 (62 y.o. M) Treating RN: Zandra Abts Primary Care Physician: [name] Other Clinician: Referring Physician: Treating Physician/Extender:Robson, [name], [name] in Treatment: 2 Education Assessment Education Provided To: Patient Education Topics Provided Wound/Skin Impairment: Methods: Explain/Verbal Responses: State content correctly Electronic Signature(s) Signed: 03/31/2019 6:00:55 PM By: Zandra Abts RN, [name] Entered By: Zandra Abts on 03/31/2019 09:24:32 -------------------------------------------------------------------------------- Wound Assessment Details Patient Name: Date of Service: YAQUB, ARNEY 03/31/2019 8:45 AM Medical Record [id number] Patient Account Number: [id number] Date of Birth/Sex: Treating RN: Jan 28, 1956 (62 y.o. [name]) Yevonne Pax Primary Care Aowyn Rozeboom: [name] Other Clinician: Referring Reylynn Vanalstine: Treating Mashawn Brazil/Extender:Robson, [name], Deirdre Peer Weeks in Treatment: 2 Wound Status Wound Number: 4 Primary Etiology: Venous Leg Ulcer Wound Location: Left Lower Leg - Circumferential Secondary Diabetic Wound/Ulcer of the Lower Etiology: Extremity Wounding Event: Gradually Appeared Wound Status: Open Date Acquired: 04/15/2018 Weeks Of Treatment: 14 Clustered Wound: Yes Photos Wound Measurements Length: (cm) 2 % Reduct Width: (cm) 1 % Reduct Depth: (cm) 0.1  Epitheli Clustered Quantity: 3 Tunnelin Area: (cm) 1.571 Undermi Volume: (  cm) 0.157 Wound Description Classification: Full Thickness Without Exposed Support Structures Wound Distinct, outline attached Margin: Exudate Medium Amount: Exudate Serosanguineous Type: Exudate red, brown Color: Wound Bed Granulation Amount: Large (67-100%) Granulation Quality: Red, Pink Necrotic Amount: Small (1-33%) Necrotic Quality: Adherent Slough Foul Odor After Cleansing: No Slough/Fibrino Yes Exposed Structure Fascia Exposed: No Fat Layer (Subcutaneous Tissue) Exposed: Yes Tendon Exposed: No Muscle Exposed: No Joint Exposed: No Bone Exposed: No ion in Area: 99.6% ion in Volume: 99.6% alization: Medium (34-66%) g: No ning: No Electronic Signature(s) Signed: 04/06/2019 4:25:07 PM By: Mikeal Hawthorne EMT/HBOT Signed: 05/23/2019 3:01:15 PM By: [name] RN Entered By: Mikeal Hawthorne on 04/05/2019 10:08:21 -------------------------------------------------------------------------------- Vitals Details Patient Name: Date of Service: [name] 03/31/2019 8:45 AM Medical Record [id number] Patient Account Number: [id number] Date of Birth/Sex: Treating RN: 1955-11-21 (62 y.o. [name]) [name] Primary Care Shifra Swartzentruber: Kandice Hams Other Clinician: Referring Duan Scharnhorst: Treating Yuki Purves/Extender:Robson, Alice Reichert, Ashby Dawes Weeks in Treatment: 2 Vital Signs Time Taken: 09:31 Temperature (F): 97.8 Height (in): 73 Pulse (bpm): 90 Weight (lbs): 425 Respiratory Rate (breaths/min): 18 Body Mass Index (BMI): 56.1 Blood Pressure (mmHg): 160/89 Reference Range: 80 - 120 mg / dl Electronic Signature(s) Signed: 05/23/2019 3:01:15 PM By: [name] RN Entered By: [name] on 03/31/2019 09:32:48

## 2019-05-23 NOTE — Progress Notes (Signed)
[name], [name] ([id number]) Visit Report for 04/28/2019 Arrival Information Details Patient Name: Date of Service: [name], [name] 04/28/2019 8:00 AM Medical Record [id number] Patient Account Number: [id number] Date of Birth/Sex: Treating RN: [birth year] (63 y.o. [name] Primary Care Shontez Sermon: [name] Other Clinician: Referring Ariann Khaimov: Treating Mckenize Mezera/Extender:Robson, [name], [name] in Treatment: 6 Visit Information History Since Last Visit Added or deleted any medications: No Patient Arrived: Ambulatory Any new allergies or adverse reactions: No Arrival Time: 07:57 Had a fall or experienced change in No Accompanied By: self activities of daily living that may affect Transfer Assistance: None risk of falls: Patient Identification Verified: Yes Signs or symptoms of abuse/neglect since last No Secondary Verification Process Completed: Yes visito Patient Requires Transmission-Based No Hospitalized since last visit: No Precautions: Implantable device outside of the clinic excluding No Patient Has Alerts: No cellular tissue based products placed in the center since last visit: Has Dressing in Place as Prescribed: Yes Pain Present Now: No Electronic Signature(s) Signed: 05/23/2019 3:00:22 PM By: Karl Ito Entered By: Karl Ito on 04/28/2019 08:02:39 -------------------------------------------------------------------------------- Compression Therapy Details Patient Name: Date of Service: [name] 04/28/2019 8:00 AM Medical Record [id number] Patient Account Number: [id number] Date of Birth/Sex: Treating RN: [birth year] (63 y.o. [name] Primary Care Makaylen Thieme: [name] Other Clinician: Referring Omaree Fuqua: Treating Breean Nannini/Extender:Robson, [name], [name] in Treatment: 6 Compression Therapy Performed for Wound Wound #4 Left,Circumferential Lower  Leg Assessment: Performed By: Clinician Shawn Stall, RN Compression Type: Four Layer Post Procedure Diagnosis Same as Pre-procedure Electronic Signature(s) Signed: 04/28/2019 5:20:59 PM By: Cherylin Mylar Entered By: Cherylin Mylar on 04/28/2019 08:48:26 -------------------------------------------------------------------------------- Encounter Discharge Information Details Patient Name: Date of Service: [name]. 04/28/2019 8:00 AM Medical Record [id number] Patient Account Number: [id number] Date of Birth/Sex: Treating RN: January 05, 1956 (63 y.o. Tammy Sours Primary Care Tahj Njoku: [name] Other Clinician: Referring Jyssica Rief: Treating Nery Kalisz/Extender:Robson, [name], [name] in Treatment: 6 Encounter Discharge Information Items Post Procedure Vitals Discharge Condition: Stable Temperature (F): 98.4 Ambulatory Status: Ambulatory Pulse (bpm): 95 Discharge Destination: Home Respiratory Rate (breaths/min): 20 Transportation: Private Auto Blood Pressure (mmHg): 139/66 Accompanied By: self Schedule Follow-up Appointment: Yes Clinical Summary of Care: Electronic Signature(s) Signed: 04/28/2019 5:02:36 PM By: Shawn Stall Entered By: Shawn Stall on 04/28/2019 09:03:34 -------------------------------------------------------------------------------- Lower Extremity Assessment Details Patient Name: Date of Service: [name], [name] 04/28/2019 8:00 AM Medical Record [id number] Patient Account Number: [id number] Date of Birth/Sex: Treating RN: [birth year] (62 y.o. Tammy Sours Primary Care Oren Barella: [name] Other Clinician: Referring Master Touchet: Treating Kasmira Cacioppo/Extender:Robson, [name], Deirdre Peer Weeks in Treatment: 6 Edema Assessment Assessed: [Left: Yes] [Right: No] Edema: [Left: Ye] [Right: s] Calf Left: Right: Point of Measurement: 31 cm From Medial Instep 51 cm cm Ankle Left: Right: Point of  Measurement: 11 cm From Medial Instep 27.1 cm cm Vascular Assessment Pulses: Dorsalis Pedis Palpable: [Left:Yes] Electronic Signature(s) Signed: 04/28/2019 5:02:36 PM By: Shawn Stall Entered By: Shawn Stall on 04/28/2019 08:09:53 -------------------------------------------------------------------------------- Multi Wound Chart Details Patient Name: Date of Service: [name] 04/28/2019 8:00 AM Medical Record [id number] Patient Account Number: [id number] Date of Birth/Sex: Treating RN: 03-Jun-1956 (63 y.o. 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] Date Acquired: [4:04/15/2018] [N/A:N/A] Weeks of Treatment: [4:18] [N/A:N/A] Wound Status: [4:Open] [N/A:N/A] Clustered Wound: [4:Yes] [N/A:N/A] Clustered Quantity: [4:0] [N/A:N/A] Measurements L x W x D 0.6x0.3x0.1 [N/A:N/A] (cm) Area (cm) : [4:0.141] [N/A:N/A] Volume (cm) : [4:0.014] [N/A:N/A] % Reduction in Area: [4:100.00%] [N/A:N/A] % Reduction in Volume: [4:100.00%] [N/A:N/A N/A] Classification: [4:Full Thickness Without Exposed Support Structures] [N/A:N/A N/A] Exudate Amount: [4:Small] [N/A:N/A N/A] Exudate Type: [4:Serosanguineous] [N/A:N/A N/A] Exudate Color: [4:red, brown] [N/A:N/A N/A] Wound Margin: [4:Flat and Intact] [N/A:N/A N/A] Granulation Amount: [4:Large (67-100%)] [N/A:N/A  N/A] Granulation Quality: [4:Red, Pink] [N/A:N/A N/A] Necrotic Amount: [4:None Present (0%)] [N/A:N/A N/A] Exposed Structures: [4:Fat Layer (Subcutaneous N/A Tissue) Exposed: Yes Fascia: No Tendon: No Muscle: No Joint: No Bone: No] [N/A:N/A] Epithelialization: [4:Large (67-100%)] [N/A:N/A N/A] Debridement: [4:Debridement - Excisional N/A] [N/A:N/A] Pre-procedure [4:08:48] [N/A:N/A N/A] Verification/Time Out Taken: Tissue Debrided: [4:Subcutaneous] [N/A:N/A N/A] Level: [4:Skin/Subcutaneous Tissue] [N/A:N/A N/A] Debridement Area (sq cm):0.18 [N/A:N/A N/A] Instrument: [4:Curette] [N/A:N/A N/A] Bleeding: [4:Minimum] [N/A:N/A N/A] Hemostasis Achieved: [4:Pressure] [N/A:N/A N/A] Debridement Treatment Procedure was tolerated [N/A:N/A N/A] Response: [4:well] Post Debridement [4:0.6x0.3x0.1] [N/A:N/A N/A] Measurements L x W x D (cm) Post Debridement [4:0.014] [N/A:N/A N/A] Volume: (cm) Procedures Performed: Compression Therapy [4:Debridement] [N/A:N/A N/A] Treatment Notes Wound #4 (Left, Circumferential Lower Leg) 1. Cleanse With Wound Cleanser Soap and water 2. Periwound Care Moisturizing lotion 3. Primary Dressing Applied Polymem Ag 4. Secondary Dressing Dry Gauze 6. Support Layer Applied 4 layer compression wrap Notes unna boot to upper lower leg. netting. Electronic Signature(s) Signed: 04/28/2019 5:20:59 PM By: Cherylin Mylar Signed: 04/28/2019 5:33:43 PM By: Baltazar Najjar MD Entered By: Baltazar Najjar on 04/28/2019 09:45:57 -------------------------------------------------------------------------------- Multi-Disciplinary Care Plan Details Patient Name: Date of Service: [name], [name] 04/28/2019 8:00 AM Medical Record [id number] Patient Account Number: [id number] Date of Birth/Sex: Treating RN: 08-24-1955 (63 y.o. [name] Primary Care Bray Vickerman: [name] Other Clinician: Referring Adren Dollins: Treating Josey Forcier/Extender:Robson,  [name], [name] in Treatment: 6 Active Inactive Venous Leg Ulcer Nursing Diagnoses: Knowledge deficit related to disease process and management Potential for venous Insuffiency (use before diagnosis confirmed) Goals: Patient will maintain optimal edema control Date Initiated: 03/03/2019 Target Resolution Date: 05/26/2019 Goal Status: Active Interventions: Compression as ordered Provide education on venous insufficiency Treatment Activities: Therapeutic compression applied : 03/03/2019 Notes: Electronic Signature(s) Signed: 04/28/2019 5:20:59 PM By: Cherylin Mylar Entered By: Cherylin Mylar on 04/28/2019 07:58:52 -------------------------------------------------------------------------------- Pain Assessment Details Patient Name: Date of Service: [name] 04/28/2019 8:00 AM Medical Record [id number] Patient Account Number: [id number] Date of Birth/Sex: Treating RN: 1955/11/28 (63 y.o. [name] Primary Care Aundrea Higginbotham: [name] Other Clinician: Referring Alejandria Wessells: Treating Shiah Berhow/Extender:Robson, [name], [name] in Treatment: 6 Active Problems Location of Pain Severity and Description of Pain Patient Has Paino No Site Locations Pain Management and Medication Current Pain Management: Electronic Signature(s) Signed: 04/28/2019 5:20:59 PM By: Cherylin Mylar Signed: 05/23/2019 3:00:22 PM By: Karl Ito Entered By: Karl Ito on 04/28/2019 08:03:10 -------------------------------------------------------------------------------- Patient/Caregiver Education Details Patient Name: Date of Service: [name] 11/13/2020andnbsp8:00 AM Medical Record [id number] Patient Account Number: [id number] Date of Birth/Gender: Treating RN: 1955/06/21 (63 y.o. [name] Primary Care Physician: [name] Other Clinician: Referring Physician: Treating Physician/Extender:Robson,  [name], [name] in Treatment: 6 Education Assessment Education Provided To: Patient Education Topics Provided Venous: Methods: Explain/Verbal Responses: State content  correctly Electronic Signature(s) Signed: 04/28/2019 5:20:59 PM By: Kela Millin Entered By: Kela Millin on 04/28/2019 07:59:18 -------------------------------------------------------------------------------- Wound Assessment Details Patient Name: Date of Service: [name], [name] 04/28/2019 8:00 AM Medical Record [id number] Patient Account Number: [id number] Date of Birth/Sex: Treating RN: 09/02/1955 (63 y.o. [name] Primary Care Denecia Brunette: Kandice Hams Other Clinician: Referring Jesselle Laflamme: Treating Chelse Matas/Extender:Robson, Alice Reichert, Lowella Dandy in Treatment: 6 Wound Status Wound Number: 4 Primary Etiology: Venous Leg Ulcer Wound Location: Left Lower Leg - Circumferential Secondary Diabetic Wound/Ulcer of the Lower Etiology: Extremity Wounding Event: Gradually Appeared Wound Status: Open Date Acquired: 04/15/2018 Weeks Of Treatment: 18 Clustered Wound: Yes Photos Wound Measurements Length: (cm) 0.6 % Reduct Width: (cm) 0.3 % Reduct Depth: (cm) 0.1 Epitheli Clustered Quantity: 0 Tunnelin Area: (cm) 0.141 Undermi Volume: (cm) 0.014 Wound Description Classification: Full Thickness Without Exposed Support Foul Od Structures Slough/ Wound Flat and Intact Margin: Exudate Small Amount: Exudate Serosanguineous Type: Exudate red, brown Color: Wound Bed Granulation Amount: Large (67-100%) Granulation Quality: Red, Pink Fascia Necrotic Amount: None Present (0%) Fat Layer Tendon Exp Muscle Exp Joint Expo Bone Expos or After Cleansing: No Fibrino No Exposed Structure Exposed: No (Subcutaneous Tissue) Exposed: Yes osed: No osed: No sed: No ed: No ion in Area: 100% ion in Volume: 100% alization: Large (67-100%) g: No ning:  No Electronic Signature(s) Signed: 05/02/2019 4:00:22 PM By: Mikeal Hawthorne EMT/HBOT Signed: 05/04/2019 6:05:31 PM By: Kela Millin Previous Signature: 04/28/2019 5:02:36 PM Version By: Deon Pilling Previous Signature: 04/28/2019 5:20:59 PM Version By: Kela Millin Entered By: Mikeal Hawthorne on 05/01/2019 08:41:27 -------------------------------------------------------------------------------- Vitals Details Patient Name: Date of Service: Matthew Henle. 04/28/2019 8:00 AM Medical Record [id number] Patient Account Number: [id number] Date of Birth/Sex: Treating RN: 09/29/1955 (63 y.o. [name] Primary Care Nakiya Rallis: Kandice Hams Other Clinician: Referring Jadier Rockers: Treating Dayami Taitt/Extender:Robson, Alice Reichert, Lowella Dandy in Treatment: 6 Vital Signs Time Taken: 08:02 Temperature (F): 98.4 Height (in): 73 Pulse (bpm): 95 Weight (lbs): 425 Respiratory Rate (breaths/min): 20 Body Mass Index (BMI): 56.1 Blood Pressure (mmHg): 139/66 Capillary Blood Glucose (mg/dl): 126 Reference Range: 80 - 120 mg / dl Electronic Signature(s) Signed: 05/23/2019 3:00:22 PM By: Sandre Kitty Entered By: Sandre Kitty on 04/28/2019 08:03:02

## 2019-05-23 NOTE — Progress Notes (Signed)
LENNON, BOUTWELL ([id number]) Visit Report for 04/12/2019 Arrival Information Details Patient Name: Date of Service: CAILAN, GENERAL 04/12/2019 11:30 AM Medical Record [id number] Patient Account Number: [id number] Date of Birth/Sex: Treating RN: 14-Jun-1956 (63 y.o. M) Primary Care Alontae Chaloux: [name] Other Clinician: Referring Kynzleigh Bandel: Treating Winifred Bodiford/Extender:Stone III, Aleda Grana, [name] in Treatment: 3 Visit Information History Since Last Visit Added or deleted any medications: No Patient Arrived: Ambulatory Any new allergies or adverse reactions: No Arrival Time: 11:16 Had a fall or experienced change in No Accompanied By: self activities of daily living that may affect Transfer Assistance: None risk of falls: Patient Identification Verified: Yes Signs or symptoms of abuse/neglect since last No Secondary Verification Process Yes visito Completed: Hospitalized since last visit: No Patient Requires Transmission-Based No Implantable device outside of the clinic excluding No Precautions: cellular tissue based products placed in the center Patient Has Alerts: No since last visit: Has Dressing in Place as Prescribed: Yes Pain Present Now: No Electronic Signature(s) Signed: 05/23/2019 3:02:15 PM By: Karl Ito Entered By: Karl Ito on 04/12/2019 11:16:28 -------------------------------------------------------------------------------- Compression Therapy Details Patient Name: Date of Service: FREDDRICK, GLADSON 04/12/2019 11:30 AM Medical Record [id number] Patient Account Number: [id number] Date of Birth/Sex: Treating RN: 10-Aug-1955 (63 y.o. Damaris Schooner Primary Care Montarius Kitagawa: [name] Other Clinician: Referring Damari Suastegui: Treating Kolbee Bogusz/Extender:Stone III, Aleda Grana, Deirdre Peer Weeks in Treatment: 3 Compression Therapy Performed for Wound Wound #4 Left,Circumferential Lower Leg Assessment: Performed By:  Clinician Zenaida Deed, RN Compression Type: Four Layer Electronic Signature(s) Signed: 04/12/2019 1:37:03 PM By: Zenaida Deed RN, [name] Entered By: Zenaida Deed on 04/12/2019 11:45:30 -------------------------------------------------------------------------------- Encounter Discharge Information Details Patient Name: Date of Service: ROXANNE, ORNER 04/12/2019 11:30 AM Medical Record [id number] Patient Account Number: [id number] Date of Birth/Sex: Treating RN: July 07, 1955 (63 y.o. Damaris Schooner Primary Care Zakariah Dejarnette: [name] Other Clinician: Referring Kazmir Oki: Treating Camran Keady/Extender:Stone III, Aleda Grana, [name] in Treatment: 3 Encounter Discharge Information Items Discharge Condition: Stable Ambulatory Status: Ambulatory Discharge Destination: Home Transportation: Private Auto Accompanied By: self Schedule Follow-up Appointment: Yes Clinical Summary of Care: Patient Declined Electronic Signature(s) Signed: 04/12/2019 1:37:03 PM By: Zenaida Deed RN, [name] Entered By: Zenaida Deed on 04/12/2019 11:46:28 -------------------------------------------------------------------------------- Patient/Caregiver Education Details Patient Name: [name] 10/28/2020andnbsp11:30 Date of Service: AM Medical Record [id number] Number: Patient Account Number: [id number] Treating RN: Date of Birth/Gender: [birth year] (62 y.o. Damaris Schooner) Other Clinician: Primary Care Treating Polite, Adine Madura Physician: Physician/Extender: Referring Physician: Renee Ramus in Treatment: 3 Education Assessment Education Provided To: Patient Education Topics Provided Venous: Methods: Explain/Verbal Responses: Reinforcements needed, State content correctly Notes pt states using lymphadema pumps 1-2 times per day Electronic Signature(s) Signed: 04/12/2019 1:37:03 PM By: Zenaida Deed RN, [name] Entered By: Zenaida Deed on 04/12/2019 11:47:08 -------------------------------------------------------------------------------- Wound Assessment Details Patient Name: Date of Service: TAKUYA, LARICCIA 04/12/2019 11:30 AM Medical Record [id number] Patient Account Number: [id number] Date of Birth/Sex: Treating RN: 05-Oct-1955 (62 y.o. Damaris Schooner Primary Care Heidy Mccubbin: [name] Other Clinician: Referring Maleek Craver: Treating Ammanda Dobbins/Extender:Stone III, Aleda Grana, Deirdre Peer Weeks in Treatment: 3 Wound Status Wound Number: 4 Primary Venous Leg Ulcer Etiology: Wound Location: Left Lower Leg - Circumferential Secondary Diabetic Wound/Ulcer of the Lower Wounding Event: Gradually Appeared Etiology: Extremity Date Acquired: 04/15/2018 Wound Status: Open Weeks Of Treatment: 16 Clustered Wound: Yes Wound Measurements Length: (cm) 2.8 Width: (cm) 2.4 Depth: (cm) 0.1 Clustered Quantity: 2 Area: (cm) 5.278 Volume: (cm) 0.528  Wound Description Classification: Full Thickness Without Exposed Suppo Structures Wound Flat and Intact Margin: Exudate Small Amount: Exudate Serosanguineous Type: Exudate red, brown Color: Wound Bed Granulation Amount: Large (67-100%) Granulation Quality: Red, Pink Necrotic Amount: Small (1-33%) Necrotic Quality: Adherent Slough dor After Cleansing: No /Fibrino Yes Exposed Structure Exposed: No er (Subcutaneous Tissue) Exposed: Yes Exposed: No Exposed: No ed: No d: No % Reduction in Area: 98.8% % Reduction in Volume: 98.8% Epithelialization: Medium (34-66%) Tunneling: No Undermining: No rt Foul [employer] Fat Lay Tendon Muscle Joint Expos Bone Expose Electronic Signature(s) Signed: 04/12/2019 1:37:03 PM By: Baruch Gouty RN, [name] Entered By: Baruch Gouty on 04/12/2019 11:45:13 -------------------------------------------------------------------------------- [place] Details Patient Name: Date of Service: [name] 04/12/2019 11:30 AM Medical Record [id number] Patient Account Number: [id number] Date of Birth/Sex: Treating RN: [birth year] (63 y.o. M) Primary Care Marv Alfrey: Kandice Hams Other Clinician: Referring Ely Spragg: Treating Kaulder Zahner/Extender:Stone III, Newell Coral, Ashby Dawes Weeks in Treatment: 3 Vital Signs Time Taken: 11:16 Temperature (F): 97.9 Height (in): 73 Pulse (bpm): 86 Weight (lbs): 425 Respiratory Rate (breaths/min): 19 Body Mass Index (BMI): 56.1 Blood Pressure (mmHg): 99/52 Capillary Blood Glucose (mg/dl): 135 Reference Range: 80 - 120 mg / dl Electronic Signature(s) Signed: 05/23/2019 3:02:15 PM By: Sandre Kitty Entered By: Sandre Kitty on 04/12/2019 11:19:42

## 2020-01-31 DIAGNOSIS — E119 Type 2 diabetes mellitus without complications: Secondary | ICD-10-CM | POA: Diagnosis not present

## 2020-01-31 DIAGNOSIS — M25579 Pain in unspecified ankle and joints of unspecified foot: Secondary | ICD-10-CM | POA: Diagnosis not present

## 2020-01-31 DIAGNOSIS — I87332 Chronic venous hypertension (idiopathic) with ulcer and inflammation of left lower extremity: Secondary | ICD-10-CM | POA: Diagnosis not present

## 2020-01-31 DIAGNOSIS — I10 Essential (primary) hypertension: Secondary | ICD-10-CM | POA: Diagnosis not present

## 2020-01-31 DIAGNOSIS — E1169 Type 2 diabetes mellitus with other specified complication: Secondary | ICD-10-CM | POA: Diagnosis not present

## 2020-01-31 DIAGNOSIS — E7800 Pure hypercholesterolemia, unspecified: Secondary | ICD-10-CM | POA: Diagnosis not present

## 2020-01-31 DIAGNOSIS — Z7984 Long term (current) use of oral hypoglycemic drugs: Secondary | ICD-10-CM | POA: Diagnosis not present

## 2020-06-06 ENCOUNTER — Ambulatory Visit: Payer: PPO | Attending: Internal Medicine

## 2020-06-06 DIAGNOSIS — Z23 Encounter for immunization: Secondary | ICD-10-CM

## 2020-06-06 NOTE — Progress Notes (Signed)
   Covid-19 Vaccination Clinic  Name:  [name]    MRN: [id number] DOB: [birth year]  06/06/2020  [name] was observed post Covid-19 immunization for 15 minutes without incident. He was provided with Vaccine Information Sheet and instruction to access the V-Safe system.   [name] was instructed to call 911 with any severe reactions post vaccine: Marland Kitchen Difficulty breathing  . Swelling of face and throat  . A fast heartbeat  . A bad rash all over body  . Dizziness and weakness   Immunizations Administered    Name Date Dose VIS Date Route   Moderna Covid-19 Booster Vaccine 06/06/2020  1:57 PM 0.25 mL 04/03/2020 Intramuscular   Manufacturer: Gala Murdoch   Lot: [id number]   NDC: 53664-403-47

## 2020-09-08 ENCOUNTER — Other Ambulatory Visit: Payer: Self-pay

## 2020-09-08 ENCOUNTER — Encounter (HOSPITAL_COMMUNITY): Payer: Self-pay | Admitting: Emergency Medicine

## 2020-09-08 ENCOUNTER — Encounter (HOSPITAL_COMMUNITY): Payer: Self-pay | Admitting: *Deleted

## 2020-09-08 ENCOUNTER — Emergency Department (HOSPITAL_COMMUNITY): Payer: PPO

## 2020-09-08 ENCOUNTER — Ambulatory Visit (HOSPITAL_COMMUNITY)
Admission: EM | Admit: 2020-09-08 | Discharge: 2020-09-08 | Disposition: A | Discharge status: Nursing Home (Includes ICF) | Payer: PPO | Attending: Emergency Medicine | Admitting: Emergency Medicine

## 2020-09-08 ENCOUNTER — Observation Stay (HOSPITAL_COMMUNITY)
Admission: EM | Admit: 2020-09-08 | Discharge: 2020-09-11 | Disposition: A | Discharge status: Home / Self Care | Payer: PPO | Attending: Internal Medicine | Admitting: Internal Medicine

## 2020-09-08 DIAGNOSIS — S2231XA Fracture of one rib, right side, initial encounter for closed fracture: Secondary | ICD-10-CM | POA: Diagnosis not present

## 2020-09-08 DIAGNOSIS — Z7984 Long term (current) use of oral hypoglycemic drugs: Secondary | ICD-10-CM | POA: Diagnosis not present

## 2020-09-08 DIAGNOSIS — E119 Type 2 diabetes mellitus without complications: Secondary | ICD-10-CM | POA: Diagnosis not present

## 2020-09-08 DIAGNOSIS — Z794 Long term (current) use of insulin: Secondary | ICD-10-CM | POA: Insufficient documentation

## 2020-09-08 DIAGNOSIS — R42 Dizziness and giddiness: Secondary | ICD-10-CM | POA: Diagnosis not present

## 2020-09-08 DIAGNOSIS — Z87891 Personal history of nicotine dependence: Secondary | ICD-10-CM | POA: Diagnosis not present

## 2020-09-08 DIAGNOSIS — IMO0002 Reserved for concepts with insufficient information to code with codable children: Secondary | ICD-10-CM | POA: Diagnosis present

## 2020-09-08 DIAGNOSIS — E785 Hyperlipidemia, unspecified: Secondary | ICD-10-CM | POA: Diagnosis not present

## 2020-09-08 DIAGNOSIS — I110 Hypertensive heart disease with heart failure: Secondary | ICD-10-CM | POA: Diagnosis not present

## 2020-09-08 DIAGNOSIS — I5022 Chronic systolic (congestive) heart failure: Principal | ICD-10-CM | POA: Diagnosis present

## 2020-09-08 DIAGNOSIS — E1165 Type 2 diabetes mellitus with hyperglycemia: Secondary | ICD-10-CM

## 2020-09-08 DIAGNOSIS — Z20822 Contact with and (suspected) exposure to covid-19: Secondary | ICD-10-CM | POA: Diagnosis not present

## 2020-09-08 DIAGNOSIS — R29898 Other symptoms and signs involving the musculoskeletal system: Secondary | ICD-10-CM

## 2020-09-08 DIAGNOSIS — R55 Syncope and collapse: Secondary | ICD-10-CM | POA: Diagnosis not present

## 2020-09-08 DIAGNOSIS — R9431 Abnormal electrocardiogram [ECG] [EKG]: Secondary | ICD-10-CM | POA: Diagnosis not present

## 2020-09-08 DIAGNOSIS — R609 Edema, unspecified: Secondary | ICD-10-CM | POA: Diagnosis not present

## 2020-09-08 DIAGNOSIS — I517 Cardiomegaly: Secondary | ICD-10-CM | POA: Diagnosis not present

## 2020-09-08 DIAGNOSIS — R200 Anesthesia of skin: Secondary | ICD-10-CM | POA: Diagnosis not present

## 2020-09-08 DIAGNOSIS — I429 Cardiomyopathy, unspecified: Secondary | ICD-10-CM | POA: Diagnosis not present

## 2020-09-08 DIAGNOSIS — I890 Lymphedema, not elsewhere classified: Secondary | ICD-10-CM | POA: Diagnosis not present

## 2020-09-08 DIAGNOSIS — R0602 Shortness of breath: Secondary | ICD-10-CM | POA: Diagnosis not present

## 2020-09-08 DIAGNOSIS — I10 Essential (primary) hypertension: Secondary | ICD-10-CM | POA: Diagnosis not present

## 2020-09-08 DIAGNOSIS — Z79899 Other long term (current) drug therapy: Secondary | ICD-10-CM | POA: Diagnosis not present

## 2020-09-08 DIAGNOSIS — I491 Atrial premature depolarization: Secondary | ICD-10-CM | POA: Diagnosis not present

## 2020-09-08 DIAGNOSIS — R531 Weakness: Secondary | ICD-10-CM | POA: Diagnosis not present

## 2020-09-08 HISTORY — DX: Essential (primary) hypertension: I10

## 2020-09-08 LAB — DIFFERENTIAL
Abs Immature Granulocytes: 0.02 10*3/uL (ref 0.00–0.07)
Basophils Absolute: 0.1 10*3/uL (ref 0.0–0.1)
Basophils Relative: 1 %
Eosinophils Absolute: 0.1 10*3/uL (ref 0.0–0.5)
Eosinophils Relative: 2 %
Immature Granulocytes: 0 %
Lymphocytes Relative: 14 %
Lymphs Abs: 1.2 10*3/uL (ref 0.7–4.0)
Monocytes Absolute: 0.6 10*3/uL (ref 0.1–1.0)
Monocytes Relative: 7 %
Neutro Abs: 6.3 10*3/uL (ref 1.7–7.7)
Neutrophils Relative %: 76 %

## 2020-09-08 LAB — CBC
HCT: 48.2 % (ref 39.0–52.0)
Hemoglobin: 16.3 g/dL (ref 13.0–17.0)
MCH: 29.1 pg (ref 26.0–34.0)
MCHC: 33.8 g/dL (ref 30.0–36.0)
MCV: 86.1 fL (ref 80.0–100.0)
Platelets: 167 10*3/uL (ref 150–400)
RBC: 5.6 MIL/uL (ref 4.22–5.81)
RDW: 13.8 % (ref 11.5–15.5)
WBC: 8.2 10*3/uL (ref 4.0–10.5)
nRBC: 0 % (ref 0.0–0.2)

## 2020-09-08 LAB — URINALYSIS, ROUTINE W REFLEX MICROSCOPIC
Bilirubin Urine: NEGATIVE
Glucose, UA: NEGATIVE mg/dL
Hgb urine dipstick: NEGATIVE
Ketones, ur: NEGATIVE mg/dL
Leukocytes,Ua: NEGATIVE
Nitrite: NEGATIVE
Protein, ur: NEGATIVE mg/dL
Specific Gravity, Urine: 1.02 (ref 1.005–1.030)
pH: 6 (ref 5.0–8.0)

## 2020-09-08 LAB — COMPREHENSIVE METABOLIC PANEL
ALT: 15 U/L (ref 0–44)
AST: 15 U/L (ref 15–41)
Albumin: 3.9 g/dL (ref 3.5–5.0)
Alkaline Phosphatase: 63 U/L (ref 38–126)
Anion gap: 8 (ref 5–15)
BUN: 23 mg/dL (ref 8–23)
CO2: 24 mmol/L (ref 22–32)
Calcium: 9.3 mg/dL (ref 8.9–10.3)
Chloride: 103 mmol/L (ref 98–111)
Creatinine, Ser: 0.98 mg/dL (ref 0.61–1.24)
GFR, Estimated: >60 mL/min (ref >60)
Glucose, Bld: 149 mg/dL — ABNORMAL HIGH (ref 70–99)
Potassium: 4.5 mmol/L (ref 3.5–5.1)
Sodium: 135 mmol/L (ref 135–145)
Total Bilirubin: 0.9 mg/dL (ref 0.3–1.2)
Total Protein: 7.2 g/dL (ref 6.5–8.1)

## 2020-09-08 LAB — RESP PANEL BY RT-PCR (FLU A&B, COVID) ARPGX2
Influenza A by PCR: NEGATIVE
Influenza B by PCR: NEGATIVE
SARS Coronavirus 2 by RT PCR: NEGATIVE

## 2020-09-08 LAB — BRAIN NATRIURETIC PEPTIDE: B Natriuretic Peptide: 104.2 pg/mL — ABNORMAL HIGH (ref 0.0–100.0)

## 2020-09-08 LAB — CBG MONITORING, ED: Glucose-Capillary: 140 mg/dL — ABNORMAL HIGH (ref 70–99)

## 2020-09-08 LAB — MAGNESIUM: Magnesium: 2 mg/dL (ref 1.7–2.4)

## 2020-09-08 MED ORDER — SODIUM CHLORIDE 0.9% FLUSH: 3.0000 mL | Freq: Once | INTRAVENOUS | Status: DC

## 2020-09-08 NOTE — ED Notes (Signed)
EMS called, reported pt has ST depression on EKG, emergency transportation requested, cardiac monitor.

## 2020-09-08 NOTE — ED Notes (Signed)
Pt transported to xray 

## 2020-09-08 NOTE — ED Provider Notes (Signed)
[facility] EMERGENCY DEPARTMENT Provider Note   CSN: [id number] Arrival date & time: 09/08/20  1634     History Chief Complaint  Patient presents with  . Weakness    [name] is a 65 y.o. male with history of elevated BMI, diabetes on Metformin, hyperlipidemia, hypertension, lymphedema presents to the ED for evaluation of lightheadedness and difficulty using his right leg.  Reports on Friday he noticed he felt lightheaded like he was going to faint every time he would go from sitting to standing.  His vision sometimes goes intermittently dark.  Had associated shortness of breath, breathing harder when standing.  Denies any room spinning sensation, chest pain, palpitations, syncope. No neck pain, arm pain, jaw pain, abdominal or back pain.  The next day he noticed that every time he would try to stand up from a sitting or laying down position his right leg felt like it would not hold his weight up.  No numbness, tingling or pain just felt like his leg was going to buckle from underneath him.  Now having both light headedness and leg symptoms at the same time when standing up.  After a few minutes of standing up this leg sensation and light headedness resolve and he is able to walk.  Now using a cane to prevent a fall. No headache.  Patient takes 20 mg of furosemide Monday and Sundays for his lymphedema and leg swelling.  At first he thought that maybe he was too dehydrated so he tried to drink some water.  Reports his legs will ache if he has a lot of fluid in them, no changes to this.  Reports baseline leg edema bilaterally but not any worse.  No history of blood clots.  No history of PE.  No history of strokes.  He reports chronic darkening and thickening of his skin in his legs for some time but denies any vessel or vascular problems in his legs.  He reports 2-3 years ago his PCP sent him for "blood flow" tests in his arms and legs but was told testing was normal.  Patient  seen at urgent care prior to ER earlier today.  He was transferred to the ED by urgent care by EMS for further evaluation.  Per urgent care NP note patient had an abnormal EKG "significant ST depression in leads I, 2 aVF, V4-V6".  Patient denies any chest pain.  Reports drinking 6 cups of coffee a day. Quit smoking cigarettes 6 months ago.    EKG interpretation from UC:  Sinus rhythm with Premature supraventricular complexes and with frequent Premature ventricular complexes Left axis deviation Right bundle branch block Abnormal ECG No old tracing to compare Confirmed by Charlton Haws [phone]) on 09/08/2020 5:01:37 PM HPI     Past Medical History:  Diagnosis Date  . Arthritis   . Diabetes mellitus without complication (HCC)   . Wound drainage    right leg 3/4 inch area open wound with clear drainage last 2 days, covering with gauze prn    Patient Active Problem List   Diagnosis Date Noted  . Scrotal wall abscess 07/14/2013    Class: Acute  . DM (diabetes mellitus), type 2, uncontrolled (HCC) 07/12/2013  . Morbid obesity (HCC) 07/12/2013  . Cellulitis, scrotum 07/10/2013    Past Surgical History:  Procedure Laterality Date  . FLEXIBLE SIGMOIDOSCOPY N/A 08/27/2014   Procedure: FLEXIBLE SIGMOIDOSCOPY;  Surgeon: [name], MD;  Location: [facility];  Service: Endoscopy;  Laterality: N/A;  .  NO PAST SURGERIES         Family History  Problem Relation Age of Onset  . Heart disease Mother   . Heart attack Father     Social History   Tobacco Use  . Smoking status: Current Some Day Smoker    Packs/day: 1.00    Years: 30.00    Pack years: 30.00    Types: Cigarettes  . Smokeless tobacco: Never Used  Substance Use Topics  . Alcohol use: No  . Drug use: No    Home Medications Prior to Admission medications   Medication Sig Start Date End Date Taking? Authorizing Provider  aspirin EC 81 MG tablet Take 81 mg by mouth daily.    [provider]  insulin  aspart (NOVOLOG) 100 UNIT/ML injection Inject 8 Units into the skin 3 (three) times daily with meals. If CBGs <100: No Insulin CBGs 101-120: take 5 Units before meal if you will eat a whole meal CBGs  121-140:take 8 Units before meal if you will eat a whole meal CBGs 140-200: take 10 units before meal if you will eat a whole meal CBGs 201-300: take 14 units before meal if you will eat a whole meal Patient not taking: Reported on 08/07/2014 07/18/13   Zannie CoveJoseph, Preetha, MD  insulin glargine (LANTUS) 100 UNIT/ML injection Inject 0.5 mLs (50 Units total) into the skin at bedtime. Patient taking differently: Inject 30 Units into the skin at bedtime.  07/18/13   Zannie CoveJoseph, Preetha, MD  metFORMIN (GLUCOPHAGE) 500 MG tablet Take 1,000 mg by mouth 2 (two) times daily with a meal.    [provider]  Multiple Vitamin (MULTIVITAMIN WITH MINERALS) TABS tablet Take 1 tablet by mouth every evening.     [provider]  tamsulosin (FLOMAX) 0.4 MG CAPS capsule Take 0.4 mg by mouth daily as needed (For urinary retention.).     [provider]    Allergies    Patient has no known allergies.  Review of Systems   Review of Systems  Eyes: Positive for visual disturbance.  Respiratory: Positive for shortness of breath.   Neurological: Positive for weakness (right leg weak, numb) and light-headedness.  All other systems reviewed and are negative.   Physical Exam Updated Vital Signs BP (!) 156/86 (BP Location: Right Wrist)   Pulse 79   Temp 98.8 F (37.1 C) (Oral)   Resp 19   SpO2 95%   Physical Exam Vitals and nursing note reviewed.  Constitutional:      General: He is not in acute distress.    Appearance: He is well-developed.     Comments: NAD.  HENT:     Head: Normocephalic and atraumatic.     Right Ear: External ear normal.     Left Ear: External ear normal.     Nose: Nose normal.  Eyes:     General: No scleral icterus.    Conjunctiva/sclera: Conjunctivae normal.   Cardiovascular:     Rate and Rhythm: Normal rate. Rhythm irregular.     Pulses:          Dorsalis pedis pulses are detected w/ Doppler on the right side and 1+ on the left side.     Heart sounds: Normal heart sounds.     Comments: Distant heart sounds.  Question irregular heartbeat although patient is having PVCs on monitor.  Very soft left DP pulse. Unable to palpate right DP pulse. Lymphedema/obesity limits exam. Will doppler pulses. No calf tenderness. No asymmetric edema between  legs  Pulmonary:     Effort: Pulmonary effort is normal.     Breath sounds: Normal breath sounds. No wheezing.  Abdominal:     General: Bowel sounds are normal.     Palpations: Abdomen is soft.     Tenderness: There is no abdominal tenderness.     Comments: No G/R/R. No suprapubic or CVA tenderness. Negative Murphy's and McBurney's  Musculoskeletal:        General: No deformity. Normal range of motion.     Cervical back: Normal range of motion and neck supple.  Skin:    General: Skin is warm and dry.     Capillary Refill: Capillary refill takes less than 2 seconds.     Comments: Skin over her legs is hyperpigmented, thickened with circular superficial ulcerated lesions that are nontender.  Loss of hair pretibially.  Thickened and discolored toenails bilaterally.  No warmth over legs.  No calf tenderness.  No abscess.  Neurological:     Mental Status: He is alert and oriented to person, place, and time.     Comments:   Mental Status: Patient is awake, alert, oriented to person, place, year, and situation. Patient is able to give a clear and coherent history. Speech is fluent and clear without dysarthria or aphasia. No signs of neglect.  Cranial Nerves: I not tested II visual fields full bilaterally. PERRL.   III, IV, VI EOMs intact without ptosis V sensation to light touch intact in all 3 divisions of trigeminal nerve bilaterally  VII facial movements symmetric bilaterally VIII hearing intact to  voice/conversation  IX, X no uvula deviation, symmetric rise of soft palate/uvula XI 5/5 SCM and trapezius strength bilaterally  XII tongue protrusion midline, symmetric L/R movements  Motor: Strength 5/5 in upper/lower extremities.  Sensation to light touch intact in face, upper/lower extremities. No pronator drift. No leg drop.  Cerebellar: No ataxia with finger to nose.  Patient able to swing his legs over and sit on the edge of the bed.  He stood up without assistance.  Immediately after standing up he reported feeling very lightheaded, sat back down.  Noticed him breathing heavy and reported shortness of breath.  Question PVCs on monitor during this event.  Patient felt better then stood back up and was actually able to take his pants off and take a few steps at the side of the bed, sit back down.   Psychiatric:        Behavior: Behavior normal.        Thought Content: Thought content normal.        Judgment: Judgment normal.     ED Results / Procedures / Treatments   Labs (all labs ordered are listed, but only abnormal results are displayed) Labs Reviewed  COMPREHENSIVE METABOLIC PANEL - Abnormal; Notable for the following components:      Result Value   Glucose, Bld 149 (*)    All other components within normal limits  RESP PANEL BY RT-PCR (FLU A&B, COVID) ARPGX2  CBC  DIFFERENTIAL  MAGNESIUM  URINALYSIS, ROUTINE W REFLEX MICROSCOPIC  BRAIN NATRIURETIC PEPTIDE  TSH    EKG EKG Interpretation  Date/Time:  Sunday September 08 2020 19:22:29 EDT Ventricular Rate:  86 PR Interval:  194 QRS Duration: 179 QT Interval:  448 QTC Calculation: 473 R Axis:   -79 Text Interpretation: Sinus rhythm Supraventricular bigeminy RBBB and LAFB increased ectopy from prior today Confirmed by Meridee Score [phone]) on 09/08/2020 7:23:51 PM   Radiology DG Chest  2 View  Result Date: 09/08/2020 CLINICAL DATA:  Weakness, lightheadedness, shortness of breath. Near syncope. EXAM: CHEST - 2 VIEW  COMPARISON:  None. FINDINGS: The heart is enlarged. There is peribronchial and interstitial thickening. No significant pleural effusion. No confluent consolidation. No pneumothorax. Remote right rib fractures. No acute osseous abnormalities are seen. IMPRESSION: Cardiomegaly. Peribronchial and interstitial thickening, may be pulmonary edema or bronchitis. Electronically Signed   By: Narda Rutherford M.D.   On: 09/08/2020 20:16   CT HEAD WO CONTRAST  Result Date: 09/08/2020 CLINICAL DATA:  Mild generalized weakness, dizziness, near syncope and RIGHT leg weakness since yesterday, history diabetes mellitus EXAM: CT HEAD WITHOUT CONTRAST TECHNIQUE: Contiguous axial images were obtained from the base of the skull through the vertex without intravenous contrast. COMPARISON:  None FINDINGS: Brain: Mild atrophy. Normal ventricular morphology. No midline shift or mass effect. Otherwise normal appearance of brain parenchyma. No intracranial hemorrhage, mass lesion, or evidence of acute infarction. No extra-axial fluid collections. Vascular: No hyperdense vessels. Skull: Demineralized but intact Sinuses/Orbits: Opacified mastoid air cells bilaterally. Paranasal sinuses clear. Other: N/A IMPRESSION: Generalized atrophy. No acute intracranial abnormalities. BILATERAL mastoid effusions. Electronically Signed   By: Ulyses Southward M.D.   On: 09/08/2020 17:57    Procedures Procedures   Medications Ordered in ED Medications  sodium chloride flush (NS) 0.9 % injection 3 mL (3 mLs Intravenous Not Given 09/08/20 1850)    ED Course  I have reviewed the triage vital signs and the nursing notes.  Pertinent labs & imaging results that were available during my care of the patient were reviewed by me and considered in my medical decision making (see chart for details).  Clinical Course as of 09/08/20 2300  Sun Sep 08, 2020  1903 CT HEAD WO CONTRAST IMPRESSION: Generalized atrophy.  No acute intracranial  abnormalities.  BILATERAL mastoid effusions. [CG]  1903 ED EKG Normal sinus rhythm Left axis deviation Right bundle branch block Abnormal ECG improved ectopy from prior today Confirmed by Meridee Score [phone]) on 09/08/2020 6:12:07 PM [CG]  1932 EKG at [facility] Sinus rhythm with Premature supraventricular complexes and with frequent Premature ventricular complexes Left axis deviation Right bundle branch block Abnormal ECG No old tracing to compare Confirmed by Charlton Haws [phone]) on 09/08/2020 5:01:37 PM [CG]  2050 DG Chest 2 View IMPRESSION: Cardiomegaly. Peribronchial and interstitial thickening, may be pulmonary edema or bronchitis. [CG]  7574 65 year old male here with dizziness lightheadedness near syncope.  EKG showing him to be in bigeminy.  Getting labs chest x-ray head CT.  Possible admission for further work-up. [MB]    Clinical Course User Index [CG] Liberty Handy, PA-C [MB] Terrilee Files, MD   MDM Rules/Calculators/A&P                          65 year old male presents from urgent care by EMS for evaluation of positional lightheadedness, feeling faint, shortness of breath as well as right leg weakness, "feels like it is going to buckle under me".  No chest pain.  No syncope. He is morbidly obese, significant leg lymphedema.  He drinks 6 cups of coffee a day.   Slightly hypertensive here. Light headed with standing, resolved after a few seconds and ambulatory here without assistance. No weakness on my exam. No CP, back pain, abdominal pain. Decreased DP pulse on the right.   EMR, triage or nurse notes reviewed to obtain more history and assist with MDM.  Urgent care visit  reviewed.  EKG at urgent care reviewed and per interpretation shows sinus rhythm with frequent supraventricular complexes and frequent PVCs, RBBB, left axis deviation.    Differential diagnosis includes cardiac etiology of near syncope, arrhythmia, orthostasis.  He has no chest pain but has  shortness of breath and symptoms are exertional.  Considered ACS as well but given lack of chest pain this is less likely.  Other considerations include CHF exacerbation, although no history of this. Volume status is difficult to determine given lymphedema and obesity. Not hypoxic. He has no frank focal weakness on my exam or room spinning sensation.  Central nervous system etiology like CVA, vertigo considered as well but again no deficits on my exam and no classic symptomatology.  Decreased DP pulse on the right leg, body habitus and lymphedema limited exam.  Extremities are warm.  He has no limb pain, coolness, calf tenderness.  No chest pain, abdominal mood, tearing back pain and dissection is considered less likely.  Lab work, imaging ordered in triage.  I have added additional lab work.  Labs reveal - glucose 149. Labs otherwise unremarkable. Delay in BNP. TSH pending.  Imaging reveals - CT head ordered in triage shows bilateral mastoid effusions but no other acute findings. CXR with cardiomegaly and bronchitis vs edema, favoring edema since he has no symptoms of bronchitis.  Several EKGs done in the ER.  Overall showing frequent PVCs/bigeminy. He is quite symptomatic with standing, SOB.  EKG changes are new.    Given EKG changes, SOB not considered low risk with SF criteria. Will speak to hospitalist for admission for nearsyncope, EKG changes. May need vascular studies for right leg symptoms. Discussed with EDP Charm Barges.  Will defer further imaging in the ER.  Final Clinical Impression(s) / ED Diagnoses Final diagnoses:  Near syncope  Abnormal EKG    Rx / DC Orders ED Discharge Orders    None       Jerrell Mylar 09/08/20 2300    Terrilee Files, MD 09/09/20 1025

## 2020-09-08 NOTE — ED Triage Notes (Signed)
Pt reports he felt faint several times yesterday and both legs gave out on him yesterday.

## 2020-09-08 NOTE — ED Provider Notes (Signed)
[facility]    CSN: [id number] Arrival date & time: 09/08/20  1530      History   Chief Complaint Chief Complaint  Patient presents with  . Near Syncope  . Extremity Weakness    HPI [name] is a 65 y.o. male.   Patient presents with near syncope and collapse yesterday.  He did not actually fall but felt very lightheaded upon standing.  He reports ongoing numbness in his right leg since yesterday.  He denies dizziness, weakness, chest pain, shortness of breath, nausea, diaphoresis, or other symptoms at this time.  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  The history is provided by the patient and medical records.    Past Medical History:  Diagnosis Date  . Arthritis   . Diabetes mellitus without complication (HCC)   . Wound drainage    right leg 3/4 inch area open wound with clear drainage last 2 days, covering with gauze prn    Patient Active Problem List   Diagnosis Date Noted  . Scrotal wall abscess 07/14/2013    Class: Acute  . DM (diabetes mellitus), type 2, uncontrolled (HCC) 07/12/2013  . Morbid obesity (HCC) 07/12/2013  . Cellulitis, scrotum 07/10/2013    Past Surgical History:  Procedure Laterality Date  . FLEXIBLE SIGMOIDOSCOPY N/A 08/27/2014   Procedure: FLEXIBLE SIGMOIDOSCOPY;  Surgeon: [name], MD;  Location: [facility];  Service: Endoscopy;  Laterality: N/A;  . NO PAST SURGERIES         Home Medications    Prior to Admission medications   Medication Sig Start Date End Date Taking? Authorizing Provider  aspirin EC 81 MG tablet Take 81 mg by mouth daily.    [provider]  insulin aspart (NOVOLOG) 100 UNIT/ML injection Inject 8 Units into the skin 3 (three) times daily with meals. If CBGs <100: No Insulin CBGs 101-120: take 5 Units before meal if you will eat a whole meal CBGs  121-140:take 8 Units before meal if you  will eat a whole meal CBGs 140-200: take 10 units before meal if you will eat a whole meal CBGs 201-300: take 14 units before meal if you will eat a whole meal Patient not taking: Reported on 08/07/2014 07/18/13   Zannie Cove, MD  insulin glargine (LANTUS) 100 UNIT/ML injection Inject 0.5 mLs (50 Units total) into the skin at bedtime. Patient taking differently: Inject 30 Units into the skin at bedtime.  07/18/13   Zannie Cove, MD  metFORMIN (GLUCOPHAGE) 500 MG tablet Take 1,000 mg by mouth 2 (two) times daily with a meal.    [provider]  Multiple Vitamin (MULTIVITAMIN WITH MINERALS) TABS tablet Take 1 tablet by mouth every evening.     [provider]  tamsulosin (FLOMAX) 0.4 MG CAPS capsule Take 0.4 mg by mouth daily as needed (For urinary retention.).     [provider]    Family History Family History  Problem Relation Age of Onset  . Heart disease Mother   . Heart attack Father     Social History Social History   Tobacco Use  . Smoking status: Current Some Day Smoker    Packs/day: 1.00    Years: 30.00    Pack years: 30.00    Types: Cigarettes  . Smokeless tobacco: Never Used  Substance Use Topics  . Alcohol use: No  . Drug use: No     Allergies  Patient has no known allergies.   Review of Systems Review of Systems  Constitutional: Negative for chills, diaphoresis and fever.  HENT: Negative for ear pain and sore throat.   Eyes: Negative for pain and visual disturbance.  Respiratory: Negative for cough and shortness of breath.   Cardiovascular: Negative for chest pain and palpitations.  Gastrointestinal: Negative for abdominal pain, nausea and vomiting.  Genitourinary: Negative for dysuria and hematuria.  Musculoskeletal: Negative for arthralgias and back pain.  Skin: Negative for color change and rash.  Neurological: Positive for light-headedness and numbness. Negative for dizziness, tremors, seizures, syncope, facial asymmetry,  speech difficulty, weakness and headaches.  All other systems reviewed and are negative.    Physical Exam Triage Vital Signs ED Triage Vitals  Enc Vitals Group     BP      Pulse      Resp      Temp      Temp src      SpO2      Weight      Height      Head Circumference      Peak Flow      Pain Score      Pain Loc      Pain Edu?      Excl. in GC?    No data found.  Updated Vital Signs BP (!) 141/73 (BP Location: Left Arm)   Pulse 85   Temp 98 F (36.7 C) (Oral)   Resp 20   SpO2 97%   Visual Acuity Right Eye Distance:   Left Eye Distance:   Bilateral Distance:    Right Eye Near:   Left Eye Near:    Bilateral Near:     Physical Exam Vitals and nursing note reviewed.  Constitutional:      General: He is not in acute distress.    Appearance: He is well-developed. He is obese.  HENT:     Head: Normocephalic and atraumatic.     Mouth/Throat:     Mouth: Mucous membranes are moist.  Eyes:     Conjunctiva/sclera: Conjunctivae normal.  Cardiovascular:     Rate and Rhythm: Normal rate and regular rhythm.     Heart sounds: Normal heart sounds.  Pulmonary:     Effort: Pulmonary effort is normal. No respiratory distress.     Breath sounds: Normal breath sounds.  Abdominal:     Palpations: Abdomen is soft.     Tenderness: There is no abdominal tenderness.  Musculoskeletal:     Cervical back: Neck supple.  Skin:    General: Skin is warm and dry.  Neurological:     General: No focal deficit present.     Mental Status: He is alert and oriented to person, place, and time.     Sensory: No sensory deficit.     Motor: No weakness.     Comments: Ambulatory with cane.  Psychiatric:        Mood and Affect: Mood normal.        Behavior: Behavior normal.      UC Treatments / Results  Labs (all labs ordered are listed, but only abnormal results are displayed) Labs Reviewed  CBG MONITORING, ED - Abnormal; Notable for the following components:      Result Value    Glucose-Capillary 140 (*)    All other components within normal limits    EKG   Radiology No results found.  Procedures Procedures (including critical care time)  Medications Ordered in  UC Medications - No data to display  Initial Impression / Assessment and Plan / UC Course  I have reviewed the triage vital signs and the nursing notes.  Pertinent labs & imaging results that were available during my care of the patient were reviewed by me and considered in my medical decision making (see chart for details).   Near syncope, abnormal EKG, right leg numbness.  EKG shows sinus rhythm, rate 88; significant ST depression in leads I, II, EF, V4, V5, V6; significant ST elevation in aVR.  Sending patient to the ED via EMS.   Final Clinical Impressions(s) / UC Diagnoses   Final diagnoses:  Near syncope  Abnormal EKG  Right leg numbness   Discharge Instructions   None    ED Prescriptions    None     PDMP not reviewed this encounter.   Mickie Bail, NP 09/08/20 1616

## 2020-09-08 NOTE — ED Triage Notes (Signed)
Pt to triage via [facility] from [facility].  Reports generalized weakness, dizziness, near syncope, and R leg numbness since Saturday morning.  20g R hand.  Denies arm weakness/numbness.  Ambulates with cane.

## 2020-09-08 NOTE — ED Notes (Signed)
Patient is being discharged from the [facility] and sent to the Emergency Department via [facility]. Per Leanna Battles, NP, patient is stable but in need of higher level of care due to abnormal EKG, lightheadedness. Patient is aware and verbalizes understanding of plan of care. Report called to Asher Muir, ED Charge RN. Vitals:   09/08/20 1542  BP: (!) 141/73  Pulse: 85  Resp: 20  Temp: 98 F (36.7 C)  SpO2: 97%

## 2020-09-09 ENCOUNTER — Observation Stay (HOSPITAL_BASED_OUTPATIENT_CLINIC_OR_DEPARTMENT_OTHER): Payer: PPO

## 2020-09-09 ENCOUNTER — Encounter (HOSPITAL_COMMUNITY): Payer: Self-pay | Admitting: Family Medicine

## 2020-09-09 DIAGNOSIS — R55 Syncope and collapse: Secondary | ICD-10-CM | POA: Diagnosis not present

## 2020-09-09 DIAGNOSIS — R29898 Other symptoms and signs involving the musculoskeletal system: Secondary | ICD-10-CM | POA: Diagnosis not present

## 2020-09-09 DIAGNOSIS — E1165 Type 2 diabetes mellitus with hyperglycemia: Secondary | ICD-10-CM | POA: Diagnosis not present

## 2020-09-09 DIAGNOSIS — I10 Essential (primary) hypertension: Secondary | ICD-10-CM | POA: Diagnosis not present

## 2020-09-09 DIAGNOSIS — I890 Lymphedema, not elsewhere classified: Secondary | ICD-10-CM

## 2020-09-09 HISTORY — DX: Lymphedema, not elsewhere classified: I89.0

## 2020-09-09 LAB — BASIC METABOLIC PANEL
Anion gap: 8 (ref 5–15)
BUN: 25 mg/dL — ABNORMAL HIGH (ref 8–23)
CO2: 25 mmol/L (ref 22–32)
Calcium: 9.3 mg/dL (ref 8.9–10.3)
Chloride: 103 mmol/L (ref 98–111)
Creatinine, Ser: 0.86 mg/dL (ref 0.61–1.24)
GFR, Estimated: >60 mL/min (ref >60)
Glucose, Bld: 132 mg/dL — ABNORMAL HIGH (ref 70–99)
Potassium: 3.8 mmol/L (ref 3.5–5.1)
Sodium: 136 mmol/L (ref 135–145)

## 2020-09-09 LAB — TROPONIN I (HIGH SENSITIVITY): Troponin I (High Sensitivity): 8 ng/L (ref <18)

## 2020-09-09 LAB — CBG MONITORING, ED
Glucose-Capillary: 134 mg/dL — ABNORMAL HIGH (ref 70–99)
Glucose-Capillary: 177 mg/dL — ABNORMAL HIGH (ref 70–99)

## 2020-09-09 LAB — CBC
HCT: 47.9 % (ref 39.0–52.0)
Hemoglobin: 15.9 g/dL (ref 13.0–17.0)
MCH: 28.6 pg (ref 26.0–34.0)
MCHC: 33.2 g/dL (ref 30.0–36.0)
MCV: 86.3 fL (ref 80.0–100.0)
Platelets: 176 10*3/uL (ref 150–400)
RBC: 5.55 MIL/uL (ref 4.22–5.81)
RDW: 13.8 % (ref 11.5–15.5)
WBC: 7.7 10*3/uL (ref 4.0–10.5)
nRBC: 0 % (ref 0.0–0.2)

## 2020-09-09 LAB — HEMOGLOBIN A1C
Hgb A1c MFr Bld: 6.6 % — ABNORMAL HIGH (ref 4.8–5.6)
Mean Plasma Glucose: 142.72 mg/dL

## 2020-09-09 LAB — GLUCOSE, CAPILLARY
Glucose-Capillary: 128 mg/dL — ABNORMAL HIGH (ref 70–99)
Glucose-Capillary: 175 mg/dL — ABNORMAL HIGH (ref 70–99)

## 2020-09-09 LAB — ECHOCARDIOGRAM COMPLETE
Area-P 1/2: 1.75 cm2
Height: 73 in
S' Lateral: 5.8 cm

## 2020-09-09 LAB — TSH: TSH: 1.42 u[IU]/mL (ref 0.350–4.500)

## 2020-09-09 MED ORDER — INSULIN GLARGINE 100 UNIT/ML ~~LOC~~ SOLN
15.0000 [IU] | Freq: Every day | SUBCUTANEOUS | Status: DC
  Administered 2020-09-09 – 2020-09-10 (×2): 15 [IU] via SUBCUTANEOUS
  Filled 2020-09-09 (×4): qty 0.15

## 2020-09-09 MED ORDER — LORAZEPAM 2 MG/ML IJ SOLN
0.5000 mg | Freq: Once | INTRAMUSCULAR | Status: DC
  Filled 2020-09-09: qty 1

## 2020-09-09 MED ORDER — PERFLUTREN LIPID MICROSPHERE
1.0000 mL | INTRAVENOUS | Status: AC | PRN
  Administered 2020-09-09: 2 mL via INTRAVENOUS
  Filled 2020-09-09: qty 10

## 2020-09-09 MED ORDER — ATORVASTATIN CALCIUM 10 MG PO TABS
10.0000 mg | ORAL_TABLET | Freq: Every day | ORAL | Status: DC
  Administered 2020-09-09 – 2020-09-11 (×3): 10 mg via ORAL
  Filled 2020-09-09 (×3): qty 1

## 2020-09-09 MED ORDER — ENOXAPARIN SODIUM 40 MG/0.4ML ~~LOC~~ SOLN
40.0000 mg | Freq: Every day | SUBCUTANEOUS | Status: DC
  Administered 2020-09-09 – 2020-09-11 (×3): 40 mg via SUBCUTANEOUS
  Filled 2020-09-09 (×3): qty 0.4

## 2020-09-09 MED ORDER — INSULIN ASPART 100 UNIT/ML ~~LOC~~ SOLN
0.0000 [IU] | Freq: Three times a day (TID) | SUBCUTANEOUS | Status: DC
  Administered 2020-09-09: 3 [IU] via SUBCUTANEOUS
  Administered 2020-09-09: 4 [IU] via SUBCUTANEOUS

## 2020-09-09 MED ORDER — ASPIRIN EC 81 MG PO TBEC
81.0000 mg | DELAYED_RELEASE_TABLET | Freq: Every day | ORAL | Status: DC
  Administered 2020-09-09 – 2020-09-11 (×3): 81 mg via ORAL
  Filled 2020-09-09 (×3): qty 1

## 2020-09-09 MED ORDER — PNEUMOCOCCAL VAC POLYVALENT 25 MCG/0.5ML IJ INJ: 0.5000 mL | INJECTION | INTRAMUSCULAR | Status: DC

## 2020-09-09 MED ORDER — INSULIN ASPART 100 UNIT/ML ~~LOC~~ SOLN: 0.0000 [IU] | Freq: Every day | SUBCUTANEOUS | Status: DC

## 2020-09-09 NOTE — H&P (Signed)
History and Physical    LYAN MOYANO [name]:[id number] DOB: 04/07/1956 DOA: 09/08/2020  PCP: [name], MD  Patient coming from: Home  I have personally briefly reviewed patient's old medical records in [facility]  Chief Complaint: lightheadedness and right leg weakness  HPI: [name] is a 65 y.o. male with medical history significant for HTN, peripheral vascular insufficiency, insulin-dependent type 2 diabetes, hyperlipidemia, lymphedema with morbid obesity who presents with symptoms of lightheadedness and right lower extremity weakness.  Patient states that this morning he began to feel very lightheaded each time that he stands up.  This felt worse after he took his Lasix for his lymphedema.  He also feel like his right leg is "liquid" and that he has some calf weakness.  Denies any calf pain or claudication symptoms.  No prior trauma or surgery on that extremity.  He presented to urgent care with the symptoms today and had concerning EKG changes and was transported to the ED.  He denies any concerns of chest pain, palpitation or shortness of breath. Endorse drinking a pot of coffee per day for decades. No alcohol or illicit drug use.   EKG here shows supraventricular bigeminy, RBBB and LAFB.  Have frequent PVCs on telemetry.  Chest x-ray shows cardiomegaly with pulmonary edema versus bronchitis.  Difficulty assessing for any fluid overload given morbid and lymphedema.  BNP of 104.No troponin obtained.  CBC with no leukocytosis or anemia.  BMP unremarkable.  Per ED PA, syncope/lightheadedness symptoms were reproducible when patient went from lying to sitting and standing.  No orthostatic vital signs were obtained. Dorsalis pedis pulse of right LE was found with bedside doppler.   Review of Systems: Constitutional: No Weight Change, No Fever ENT/Mouth: No sore throat, No Rhinorrhea Eyes: No Eye Pain, No Vision Changes Cardiovascular: No Chest Pain, no SOB, No PND, No  Dyspnea on Exertion, No Orthopnea, No Claudication, No Edema, No Palpitations Respiratory: No Cough, No Sputum,  Gastrointestinal: No Nausea, No Vomiting, No Diarrhea, No Constipation, No Pain Genitourinary: No Urgency, No Flank Pain Musculoskeletal: No Arthralgias, No Myalgias Skin: No Skin Lesions, No Pruritus, Neuro: + Weakness, No Numbness,  No Loss of Consciousness, + Syncope Psych: No Anxiety/Panic, No Depression, no decrease appetite Heme/Lymph: No Bruising, No Bleeding  Past Medical History:  Diagnosis Date  . Arthritis   . Diabetes mellitus without complication (HCC)   . Wound drainage    right leg 3/4 inch area open wound with clear drainage last 2 days, covering with gauze prn    Past Surgical History:  Procedure Laterality Date  . FLEXIBLE SIGMOIDOSCOPY N/A 08/27/2014   Procedure: FLEXIBLE SIGMOIDOSCOPY;  Surgeon: [name], MD;  Location: [facility];  Service: Endoscopy;  Laterality: N/A;  . NO PAST SURGERIES       reports that he has been smoking cigarettes. He has a 30.00 pack-year smoking history. He has never used smokeless tobacco. He reports that he does not drink alcohol and does not use drugs. Social History  No Known Allergies  Family History  Problem Relation Age of Onset  . Heart disease Mother   . Heart attack Father      Prior to Admission medications   Medication Sig Start Date End Date Taking? Authorizing Provider  aspirin EC 81 MG tablet Take 81 mg by mouth daily.    [provider]  insulin aspart (NOVOLOG) 100 UNIT/ML injection Inject 8 Units into the skin 3 (three) times daily with meals. If CBGs <  100: No Insulin CBGs 101-120: take 5 Units before meal if you will eat a whole meal CBGs  121-140:take 8 Units before meal if you will eat a whole meal CBGs 140-200: take 10 units before meal if you will eat a whole meal CBGs 201-300: take 14 units before meal if you will eat a whole meal Patient not taking: Reported on 08/07/2014  07/18/13   Zannie Cove, MD  insulin glargine (LANTUS) 100 UNIT/ML injection Inject 0.5 mLs (50 Units total) into the skin at bedtime. Patient taking differently: Inject 30 Units into the skin at bedtime.  07/18/13   Zannie Cove, MD  metFORMIN (GLUCOPHAGE) 500 MG tablet Take 1,000 mg by mouth 2 (two) times daily with a meal.    [provider]  Multiple Vitamin (MULTIVITAMIN WITH MINERALS) TABS tablet Take 1 tablet by mouth every evening.     [provider]  tamsulosin (FLOMAX) 0.4 MG CAPS capsule Take 0.4 mg by mouth daily as needed (For urinary retention.).     [provider]    Physical Exam: Vitals:   09/08/20 2300 09/08/20 2315 09/09/20 0030 09/09/20 0045  BP: (!) 163/74 (!) 160/76 (!) 175/85   Pulse: (!) 41 79 87 84  Resp: 19 18 (!) 21 19  Temp:      TempSrc:      SpO2: 98% 95% 97% 96%    Constitutional: NAD, calm, comfortable, morbidly obese male laying at 40 degree incline in bed Vitals:   09/08/20 2300 09/08/20 2315 09/09/20 0030 09/09/20 0045  BP: (!) 163/74 (!) 160/76 (!) 175/85   Pulse: (!) 41 79 87 84  Resp: 19 18 (!) 21 19  Temp:      TempSrc:      SpO2: 98% 95% 97% 96%   Eyes: PERRL, lids and conjunctivae normal ENMT: Mucous membranes are moist.  Neck: normal, supple Respiratory: clear to auscultation bilaterally, no wheezing, no crackles. Normal respiratory effort. No accessory muscle use.  Cardiovascular: Regular rate and rhythm with frequent PVCs noted on telemetry, no murmurs / rubs / gallops. 2+ pedal pulses. Abdomen: no tenderness, no masses palpated. . Bowel sounds positive.  Musculoskeletal: no clubbing / cyanosis. No joint deformity upper and lower extremities. Good ROM, no contractures. Normal muscle tone.  Large truncal lower extremity with discoloration/chronic venous stasis changes of the skin and lymphedema. Skin: Discoloration and chronic venous stasis changes lower extremity Neurologic: CN 2-12 grossly intact.  Sensation intact. Strength 5/5 in all 4.  Psychiatric: Normal judgment and insight. Alert and oriented x 3. Normal mood.     Labs on Admission: I have personally reviewed following labs and imaging studies  CBC: Recent Labs  Lab 09/08/20 1704  WBC 8.2  NEUTROABS 6.3  HGB 16.3  HCT 48.2  MCV 86.1  PLT 167   Basic Metabolic Panel: Recent Labs  Lab 09/08/20 1704 09/08/20 2223  NA 135  --   K 4.5  --   CL 103  --   CO2 24  --   GLUCOSE 149*  --   BUN 23  --   CREATININE 0.98  --   CALCIUM 9.3  --   MG  --  2.0   GFR: CrCl cannot be calculated (Unknown ideal weight.). Liver Function Tests: Recent Labs  Lab 09/08/20 1704  AST 15  ALT 15  ALKPHOS 63  BILITOT 0.9  PROT 7.2  ALBUMIN 3.9   No results for input(s): LIPASE, AMYLASE in the last 168 hours. No results  for input(s): AMMONIA in the last 168 hours. Coagulation Profile: No results for input(s): INR, PROTIME in the last 168 hours. Cardiac Enzymes: No results for input(s): CKTOTAL, CKMB, CKMBINDEX, TROPONINI in the last 168 hours. BNP (last 3 results) No results for input(s): PROBNP in the last 8760 hours. HbA1C: No results for input(s): HGBA1C in the last 72 hours. CBG: Recent Labs  Lab 09/08/20 1608  GLUCAP 140*   Lipid Profile: No results for input(s): CHOL, HDL, LDLCALC, TRIG, CHOLHDL, LDLDIRECT in the last 72 hours. Thyroid Function Tests: Recent Labs    09/08/20 2258  TSH 1.420   Anemia Panel: No results for input(s): VITAMINB12, FOLATE, FERRITIN, TIBC, IRON, RETICCTPCT in the last 72 hours. Urine analysis:    Component Value Date/Time   COLORURINE YELLOW 09/08/2020 2344   APPEARANCEUR CLEAR 09/08/2020 2344   LABSPEC 1.020 09/08/2020 2344   PHURINE 6.0 09/08/2020 2344   GLUCOSEU NEGATIVE 09/08/2020 2344   HGBUR NEGATIVE 09/08/2020 2344   BILIRUBINUR NEGATIVE 09/08/2020 2344   KETONESUR NEGATIVE 09/08/2020 2344   PROTEINUR NEGATIVE 09/08/2020 2344   UROBILINOGEN 4.0 (H) 07/10/2013  0310   NITRITE NEGATIVE 09/08/2020 2344   LEUKOCYTESUR NEGATIVE 09/08/2020 2344    Radiological Exams on Admission: DG Chest 2 View  Result Date: 09/08/2020 CLINICAL DATA:  Weakness, lightheadedness, shortness of breath. Near syncope. EXAM: CHEST - 2 VIEW COMPARISON:  None. FINDINGS: The heart is enlarged. There is peribronchial and interstitial thickening. No significant pleural effusion. No confluent consolidation. No pneumothorax. Remote right rib fractures. No acute osseous abnormalities are seen. IMPRESSION: Cardiomegaly. Peribronchial and interstitial thickening, may be pulmonary edema or bronchitis. Electronically Signed   By: Narda Rutherford M.D.   On: 09/08/2020 20:16   CT HEAD WO CONTRAST  Result Date: 09/08/2020 CLINICAL DATA:  Mild generalized weakness, dizziness, near syncope and RIGHT leg weakness since yesterday, history diabetes mellitus EXAM: CT HEAD WITHOUT CONTRAST TECHNIQUE: Contiguous axial images were obtained from the base of the skull through the vertex without intravenous contrast. COMPARISON:  None FINDINGS: Brain: Mild atrophy. Normal ventricular morphology. No midline shift or mass effect. Otherwise normal appearance of brain parenchyma. No intracranial hemorrhage, mass lesion, or evidence of acute infarction. No extra-axial fluid collections. Vascular: No hyperdense vessels. Skull: Demineralized but intact Sinuses/Orbits: Opacified mastoid air cells bilaterally. Paranasal sinuses clear. Other: N/A IMPRESSION: Generalized atrophy. No acute intracranial abnormalities. BILATERAL mastoid effusions. Electronically Signed   By: Ulyses Southward M.D.   On: 09/08/2020 17:57      Assessment/Plan  Syncope positional. Awaiting orthostatic vital signs.  Pt had echo in 12/2018 with EF of 50 to 55% and normal diastolic parameters.  There was mild thickening of the mitral and aortic valve. Will repeat echocardiogram to assess for any new valvular abnormalities. CXR shows pulmonary  edema but no dyspnea or chest pain.  Difficult to assess fluid status given morbid obesity.  BNP of 104 but could be falsely low due to obesity. Obtain troponin.  TSH normal Keep on continuous telemetry  Right LE weakness  endorse calf weakness but no pain. No claudication symptoms.Normal ROM.  Had ABI done in 2015 that were normal with 1.43 on the left and 1.22 on the right. Could consider repeat but symptoms not convincing for claudication Could be secondary to morbidly obese habitus Continue to monitor   HTN Hold Lasix for now- unclear if syncope from orthostatic hypotension. Awaiting orthostatic vital signs   Insulin-dependent Type 2 diabetes  Reports he is suppose to be on 50U of  Lantus but has been doing 30qHS due to cost  check HbA1C  15U qHS with resistant SSI for now- admission BG of 149  HLD  continue atorvastatin   Lymphedema follows outpatient with wound clinic   Morbid obesity  DVT prophylaxis:.Lovenox Code Status: Full Family Communication: Plan discussed with patient at bedside  disposition Plan: Home with observation Consults called:  Admission status: Observation     Ching T Tu DO [facility]   If 7PM-7AM, please contact night-coverage [email or web address]   09/09/2020, 1:24 AM

## 2020-09-09 NOTE — ED Notes (Addendum)
Pt finished breakfast but CBG was not completed when this RN took over the pt. No signs of hypoglycemia or hyperglycemia. Pt resting comfortably.

## 2020-09-09 NOTE — Progress Notes (Signed)
Pt seen and examined. Pt admitted earlier this am for near syncope.  Please see Dr Fortunato Curling note for detailed H&P .   [name] is a 65 y.o. male with medical history significant for HTN, peripheral vascular insufficiency, insulin-dependent type 2 diabetes, hyperlipidemia, lymphedema with morbid obesity who presents with symptoms of lightheadedness and right lower extremity weakness.  Initial CT head without contrast is negative for acute stroke.  Follow up MRI brain ordered, but pt refused, saying he is claustrophobic, and will agree for open MRI only.  Troponin is negative.  Echocardiogram ordered and pending.  Will follow.   Kathlen Mody, MD

## 2020-09-09 NOTE — Progress Notes (Signed)
  Echocardiogram 2D Echocardiogram has been performed.  [name] 09/09/2020, 11:43 AM

## 2020-09-09 NOTE — Progress Notes (Signed)
Patient refused premed ativan for MRI and refused MRI. Ativan 0.25 mL wasted. Neta Ehlers, RN witnessed. Cammy Copa, RN witnessed.

## 2020-09-09 NOTE — ED Notes (Signed)
Placed patient on hospital bed and provided warm blankets. Pt also rec'd graham crackers and OJ. Pt updated on plan of care. Bed in position. Resp even and unlabored. Call bell within reach.

## 2020-09-10 ENCOUNTER — Ambulatory Visit (HOSPITAL_COMMUNITY)
Admission: EM | Disposition: A | Discharge status: Home / Self Care | Payer: Self-pay | Source: Home / Self Care | Attending: Emergency Medicine

## 2020-09-10 ENCOUNTER — Encounter (HOSPITAL_COMMUNITY): Payer: Self-pay | Admitting: Cardiology

## 2020-09-10 DIAGNOSIS — I428 Other cardiomyopathies: Secondary | ICD-10-CM | POA: Diagnosis not present

## 2020-09-10 DIAGNOSIS — I5022 Chronic systolic (congestive) heart failure: Secondary | ICD-10-CM | POA: Diagnosis present

## 2020-09-10 DIAGNOSIS — I429 Cardiomyopathy, unspecified: Secondary | ICD-10-CM

## 2020-09-10 DIAGNOSIS — I420 Dilated cardiomyopathy: Secondary | ICD-10-CM

## 2020-09-10 DIAGNOSIS — I890 Lymphedema, not elsewhere classified: Secondary | ICD-10-CM | POA: Diagnosis not present

## 2020-09-10 DIAGNOSIS — I10 Essential (primary) hypertension: Secondary | ICD-10-CM | POA: Diagnosis not present

## 2020-09-10 DIAGNOSIS — E1165 Type 2 diabetes mellitus with hyperglycemia: Secondary | ICD-10-CM | POA: Diagnosis not present

## 2020-09-10 HISTORY — PX: RIGHT/LEFT HEART CATH AND CORONARY ANGIOGRAPHY: CATH118266

## 2020-09-10 LAB — POCT I-STAT 7, (LYTES, BLD GAS, ICA,H+H)
Acid-Base Excess: 1 mmol/L (ref 0.0–2.0)
Bicarbonate: 26.7 mmol/L (ref 20.0–28.0)
Calcium, Ion: 1.23 mmol/L (ref 1.15–1.40)
HCT: 47 % (ref 39.0–52.0)
Hemoglobin: 16 g/dL (ref 13.0–17.0)
O2 Saturation: 98 %
Potassium: 4.1 mmol/L (ref 3.5–5.1)
Sodium: 138 mmol/L (ref 135–145)
TCO2: 28 mmol/L (ref 22–32)
pCO2 arterial: 47.2 mmHg (ref 32.0–48.0)
pH, Arterial: 7.36 (ref 7.350–7.450)
pO2, Arterial: 118 mmHg — ABNORMAL HIGH (ref 83.0–108.0)

## 2020-09-10 LAB — POCT I-STAT EG7
Acid-Base Excess: 1 mmol/L (ref 0.0–2.0)
Acid-Base Excess: 1 mmol/L (ref 0.0–2.0)
Acid-Base Excess: 1 mmol/L (ref 0.0–2.0)
Bicarbonate: 26.7 mmol/L (ref 20.0–28.0)
Bicarbonate: 27 mmol/L (ref 20.0–28.0)
Bicarbonate: 27.1 mmol/L (ref 20.0–28.0)
Calcium, Ion: 1.19 mmol/L (ref 1.15–1.40)
Calcium, Ion: 1.19 mmol/L (ref 1.15–1.40)
Calcium, Ion: 1.2 mmol/L (ref 1.15–1.40)
HCT: 45 % (ref 39.0–52.0)
HCT: 46 % (ref 39.0–52.0)
HCT: 46 % (ref 39.0–52.0)
Hemoglobin: 15.3 g/dL (ref 13.0–17.0)
Hemoglobin: 15.6 g/dL (ref 13.0–17.0)
Hemoglobin: 15.6 g/dL (ref 13.0–17.0)
O2 Saturation: 77 %
O2 Saturation: 81 %
O2 Saturation: 82 %
Potassium: 4.1 mmol/L (ref 3.5–5.1)
Potassium: 4.1 mmol/L (ref 3.5–5.1)
Potassium: 4.1 mmol/L (ref 3.5–5.1)
Sodium: 138 mmol/L (ref 135–145)
Sodium: 139 mmol/L (ref 135–145)
Sodium: 139 mmol/L (ref 135–145)
TCO2: 28 mmol/L (ref 22–32)
TCO2: 28 mmol/L (ref 22–32)
TCO2: 29 mmol/L (ref 22–32)
pCO2, Ven: 46.3 mmHg (ref 44.0–60.0)
pCO2, Ven: 46.8 mmHg (ref 44.0–60.0)
pCO2, Ven: 47.9 mmHg (ref 44.0–60.0)
pH, Ven: 7.361 (ref 7.250–7.430)
pH, Ven: 7.364 (ref 7.250–7.430)
pH, Ven: 7.374 (ref 7.250–7.430)
pO2, Ven: 44 mmHg (ref 32.0–45.0)
pO2, Ven: 48 mmHg — ABNORMAL HIGH (ref 32.0–45.0)
pO2, Ven: 48 mmHg — ABNORMAL HIGH (ref 32.0–45.0)

## 2020-09-10 LAB — GLUCOSE, CAPILLARY
Glucose-Capillary: 144 mg/dL — ABNORMAL HIGH (ref 70–99)
Glucose-Capillary: 128 mg/dL — ABNORMAL HIGH (ref 70–99)
Glucose-Capillary: 124 mg/dL — ABNORMAL HIGH (ref 70–99)
Glucose-Capillary: 136 mg/dL — ABNORMAL HIGH (ref 70–99)

## 2020-09-10 SURGERY — RIGHT/LEFT HEART CATH AND CORONARY ANGIOGRAPHY
Anesthesia: LOCAL

## 2020-09-10 MED ORDER — SODIUM CHLORIDE 0.9 % IV SOLN: INTRAVENOUS | Status: DC

## 2020-09-10 MED ORDER — SODIUM CHLORIDE 0.9 % IV SOLN: 250.0000 mL | INTRAVENOUS | Status: DC | PRN

## 2020-09-10 MED ORDER — FENTANYL CITRATE (PF) 100 MCG/2ML IJ SOLN
INTRAMUSCULAR | Status: AC
  Filled 2020-09-10: qty 2

## 2020-09-10 MED ORDER — SODIUM CHLORIDE 0.9% FLUSH
3.0000 mL | Freq: Two times a day (BID) | INTRAVENOUS | Status: DC
  Administered 2020-09-10: 3 mL via INTRAVENOUS

## 2020-09-10 MED ORDER — ONDANSETRON HCL 4 MG/2ML IJ SOLN: 4.0000 mg | Freq: Four times a day (QID) | INTRAMUSCULAR | Status: DC | PRN

## 2020-09-10 MED ORDER — HEPARIN SODIUM (PORCINE) 1000 UNIT/ML IJ SOLN
INTRAMUSCULAR | Status: AC
  Filled 2020-09-10: qty 1

## 2020-09-10 MED ORDER — IOHEXOL 350 MG/ML SOLN
INTRAVENOUS | Status: DC | PRN
  Administered 2020-09-10: 40 mL via INTRA_ARTERIAL

## 2020-09-10 MED ORDER — VERAPAMIL HCL 2.5 MG/ML IV SOLN
INTRAVENOUS | Status: DC | PRN
  Administered 2020-09-10: 10 mL via INTRA_ARTERIAL

## 2020-09-10 MED ORDER — CARVEDILOL 3.125 MG PO TABS
3.1250 mg | ORAL_TABLET | Freq: Two times a day (BID) | ORAL | Status: DC
  Administered 2020-09-10 – 2020-09-11 (×2): 3.125 mg via ORAL
  Filled 2020-09-10 (×2): qty 1

## 2020-09-10 MED ORDER — HEPARIN (PORCINE) IN NACL 1000-0.9 UT/500ML-% IV SOLN
INTRAVENOUS | Status: DC | PRN
  Administered 2020-09-10 (×4): 500 mL

## 2020-09-10 MED ORDER — HEPARIN (PORCINE) IN NACL 1000-0.9 UT/500ML-% IV SOLN
INTRAVENOUS | Status: AC
  Filled 2020-09-10: qty 1000

## 2020-09-10 MED ORDER — VERAPAMIL HCL 2.5 MG/ML IV SOLN
INTRAVENOUS | Status: AC
  Filled 2020-09-10: qty 2

## 2020-09-10 MED ORDER — LIDOCAINE HCL (PF) 1 % IJ SOLN
INTRAMUSCULAR | Status: AC
  Filled 2020-09-10: qty 30

## 2020-09-10 MED ORDER — MIDAZOLAM HCL 2 MG/2ML IJ SOLN
INTRAMUSCULAR | Status: AC
  Filled 2020-09-10: qty 2

## 2020-09-10 MED ORDER — SODIUM CHLORIDE 0.9% FLUSH: 3.0000 mL | INTRAVENOUS | Status: DC | PRN

## 2020-09-10 MED ORDER — ACETAMINOPHEN 325 MG PO TABS: 650.0000 mg | ORAL_TABLET | ORAL | Status: DC | PRN

## 2020-09-10 MED ORDER — HEPARIN SODIUM (PORCINE) 1000 UNIT/ML IJ SOLN
INTRAMUSCULAR | Status: DC | PRN
  Administered 2020-09-10: 5000 [IU] via INTRAVENOUS

## 2020-09-10 MED ORDER — INSULIN ASPART 100 UNIT/ML ~~LOC~~ SOLN
0.0000 [IU] | Freq: Three times a day (TID) | SUBCUTANEOUS | Status: DC
  Administered 2020-09-10 – 2020-09-11 (×3): 3 [IU] via SUBCUTANEOUS

## 2020-09-10 MED ORDER — MIDAZOLAM HCL 2 MG/2ML IJ SOLN
INTRAMUSCULAR | Status: DC | PRN
  Administered 2020-09-10: 2 mg via INTRAVENOUS

## 2020-09-10 MED ORDER — LOSARTAN POTASSIUM 25 MG PO TABS
25.0000 mg | ORAL_TABLET | Freq: Every day | ORAL | Status: DC
  Administered 2020-09-11: 25 mg via ORAL
  Filled 2020-09-10: qty 1

## 2020-09-10 MED ORDER — LIDOCAINE HCL (PF) 1 % IJ SOLN
INTRAMUSCULAR | Status: DC | PRN
  Administered 2020-09-10 (×2): 2 mL via INTRADERMAL

## 2020-09-10 MED ORDER — FENTANYL CITRATE (PF) 100 MCG/2ML IJ SOLN
INTRAMUSCULAR | Status: DC | PRN
  Administered 2020-09-10: 25 ug via INTRAVENOUS

## 2020-09-10 SURGICAL SUPPLY — 13 items
CATH OPTITORQUE TIG 4.0 5F (CATHETERS) ×1 IMPLANT
CATH SWAN GANZ 7F STRAIGHT (CATHETERS) ×1 IMPLANT
DEVICE RAD COMP TR BAND LRG (VASCULAR PRODUCTS) ×1 IMPLANT
DEVICE RAD TR BAND REGULAR (VASCULAR PRODUCTS) ×1 IMPLANT
GLIDESHEATH SLEND A-KIT 6F 22G (SHEATH) ×1 IMPLANT
GLIDESHEATH SLENDER 7FR .021G (SHEATH) ×1 IMPLANT
GUIDEWIRE INQWIRE 1.5J.035X260 (WIRE) IMPLANT
INQWIRE 1.5J .035X260CM (WIRE) ×2
KIT HEART LEFT (KITS) ×2 IMPLANT
MAT PREVALON FULL STRYKER (MISCELLANEOUS) ×1 IMPLANT
PACK CARDIAC CATHETERIZATION (CUSTOM PROCEDURE TRAY) ×2 IMPLANT
TRANSDUCER W/STOPCOCK (MISCELLANEOUS) ×2 IMPLANT
TUBING CIL FLEX 10 FLL-RA (TUBING) ×2 IMPLANT

## 2020-09-10 NOTE — Interval H&P Note (Signed)
History and Physical Interval Note:  09/10/2020 2:33 PM  [name]  has presented today for surgery, with the diagnosis of cardiomyopathy.  The various methods of treatment have been discussed with the patient and family. After consideration of risks, benefits and other options for treatment, the patient has consented to  Procedure(s): RIGHT/LEFT HEART CATH AND CORONARY ANGIOGRAPHY (N/A) and possible angioplasty as a surgical intervention.  The patient's history has been reviewed, patient examined, no change in status, stable for surgery.  I have reviewed the patient's chart and labs.  Questions were answered to the patient's satisfaction.   Cath Lab Visit (complete for each Cath Lab visit)  Clinical Evaluation Leading to the Procedure:   ACS: No.  Non-ACS:    Anginal Classification: CCS III  Anti-ischemic medical therapy: Minimal Therapy (1 class of medications)  Non-Invasive Test Results: No non-invasive testing performed  Prior CABG: No previous CABG  [name]

## 2020-09-10 NOTE — H&P (View-Only) (Signed)
CARDIOLOGY CONSULT NOTE  Patient ID: BURNETT SPRAY MRN: [id number] DOB/AGE: 1956/05/03 65 y.o.  Admit date: 09/08/2020 Referring Physician  Kathlen Mody, MD Primary Physician:  [name], MD Reason for Consultation  Cardiomyopathy  Patient ID: GEVORK AYYAD, male    DOB: 1955/10/18, 65 y.o.   MRN: [id number]  Chief Complaint  Patient presents with  . Weakness   HPI:    LAYTON NAVES  is a 65 y.o. Caucasian male with hypertension, hyperlipidemia, type 2 diabetes mellitus, chronic lymphedema of the bilateral lower extremity, morbid obesity, tobacco use disorder quit in September 2021, who lives by himself and is presently disabled was admitted to the hospital with dizziness and generalized weakness in his legs.    He denied chest pain or shortness of breath.  He has no known coronary artery disease or peripheral arterial disease.  He has been semi-compliant in using his pneumatic pumps for his lymphedema.  He endorses no PND or orthopnea.  An echocardiogram was obtained which had revealed severely reduced LVEF and hence a consultation was placed no management of cardiomyopathy.  Past Medical History:  Diagnosis Date  . Arthritis   . Diabetes mellitus without complication (HCC)   . Hypertension   . Lymphedema 09/09/2020  . Wound drainage    right leg 3/4 inch area open wound with clear drainage last 2 days, covering with gauze prn   Past Surgical History:  Procedure Laterality Date  . COLONOSCOPY  2014 ?  Marland Kitchen FLEXIBLE SIGMOIDOSCOPY N/A 08/27/2014   Procedure: FLEXIBLE SIGMOIDOSCOPY;  Surgeon: [name], MD;  Location: [facility];  Service: Endoscopy;  Laterality: N/A;  . NO PAST SURGERIES     Social History   Tobacco Use  . Smoking status: Former Smoker    Packs/day: 1.00    Years: 30.00    Pack years: 30.00    Types: Cigarettes    Quit date: 05/03/2020    Years since quitting: 0.3  . Smokeless tobacco: Never Used  Substance Use Topics  . Alcohol use: No     Family History  Problem Relation Age of Onset  . Heart disease Mother   . Heart attack Father     Marital Sttus: Divorced  ROS  Review of Systems  Constitutional: Negative.  HENT: Negative.   Cardiovascular: Positive for leg swelling. Negative for chest pain, dyspnea on exertion, orthopnea, palpitations, paroxysmal nocturnal dyspnea and syncope.  Musculoskeletal: Positive for muscle weakness.  Gastrointestinal: Negative.  Negative for melena.  Genitourinary: Negative.   Neurological: Positive for dizziness and weakness (bilateral lower extremity).  Psychiatric/Behavioral: Negative.   All other systems reviewed and are negative.  Objective   Vitals with BMI 09/10/2020 09/10/2020 09/09/2020  Height - - 6\' 1"   Weight - - 427 lbs 11 oz  BMI - - 56.44  Systolic 129 133  Diastolic 91 90 82  Pulse 63 77 79    Blood pressure (!) 129/91, pulse 63, temperature 97.8 F (36.6 C), temperature source Oral, resp. rate 18, height 6\' 1"  (1.854 m), weight (!) 194 kg, SpO2 95 %.    Physical Exam Constitutional:      Comments: Morbidly obese in no acute distress.  Cardiovascular:     Rate and Rhythm: Normal rate and regular rhythm.     Pulses:          Carotid pulses are 2+ on the right side and 2+ on the left side.      Dorsalis pedis pulses are 1+ on  the right side and 2+ on the left side.       Posterior tibial pulses are 0 on the right side and 0 on the left side.     Heart sounds: Normal heart sounds. No murmur heard. No gallop.      Comments: Femoral and popliteal pulse difficult to feel due to patient's body habitus.  No JVD Pulmonary:     Effort: Pulmonary effort is normal.     Breath sounds: Normal breath sounds.  Abdominal:     General: Bowel sounds are normal.     Palpations: Abdomen is soft.     Comments: Obese. Pannus present  Musculoskeletal:     Cervical back: Normal range of motion and neck supple.     Right lower leg: Edema (non pitting. Chronic venostasis  changes noted) present.     Left lower leg: Edema (non pitting. Chronic venostasis changes noted) present.    Laboratory examination:   Recent Labs    09/08/20 1704 09/09/20 0344  NA 135 136  K 4.5 3.8  CL 103 103  CO2 24 25  GLUCOSE 149* 132*  BUN 23 25*  CREATININE 0.98 0.86  CALCIUM 9.3 9.3  GFRNONAA >60 >60   estimated creatinine clearance is 156.1 mL/min (by C-G formula based on SCr of 0.86 mg/dL).  CMP Latest Ref Rng & Units 09/09/2020 09/08/2020 07/17/2013  Glucose 70 - 99 mg/dL 902(I) 097(D) 532(D)  BUN 8 - 23 mg/dL 92(E) 23 9  Creatinine 0.61 - 1.24 mg/dL 2.68 3.41 9.62  Sodium 135 - 145 mmol/L 136 135 143  Potassium 3.5 - 5.1 mmol/L 3.8 4.5 3.5(L)  Chloride 98 - 111 mmol/L 103 103 107  CO2 22 - 32 mmol/L 25 24 26   Calcium 8.9 - 10.3 mg/dL 9.3 9.3 2.2(L)  Total Protein 6.5 - 8.1 g/dL - 7.2 -  Total Bilirubin 0.3 - 1.2 mg/dL - 0.9 -  Alkaline Phos 38 - 126 U/L - 63 -  AST 15 - 41 U/L - 15 -  ALT 0 - 44 U/L - 15 -   CBC Latest Ref Rng & Units 09/09/2020 09/08/2020 07/17/2013  WBC 4.0 - 10.5 K/uL 7.7 8.2 6.2  Hemoglobin 13.0 - 17.0 g/dL 79.8 92.1 12.9(L)  Hematocrit 39.0 - 52.0 % 47.9 48.2 40.1  Platelets 150 - 400 K/uL 176 167 257   Lipid Panel No results for input(s): CHOL, TRIG, LDLCALC, VLDL, HDL, CHOLHDL, LDLDIRECT in the last 8760 hours.  HEMOGLOBIN A1C Lab Results  Component Value Date   HGBA1C 6.6 (H) 09/09/2020   MPG 142.72 09/09/2020   TSH Recent Labs    09/08/20 2258  TSH 1.420   BNP (last 3 results) Recent Labs    09/08/20 2223  BNP 104.2*   Medications and allergies  No Known Allergies   Current Meds  Medication Sig  . aspirin EC 81 MG tablet Take 81 mg by mouth daily.  Marland Kitchen atorvastatin (LIPITOR) 10 MG tablet Take 10 mg by mouth at bedtime.  . furosemide (LASIX) 20 MG tablet Take 20 mg by mouth 2 (two) times a week. Wednesday and Sunday  . insulin glargine (LANTUS) 100 UNIT/ML injection Inject 0.5 mLs (50 Units total) into the skin at  bedtime. (Patient taking differently: Inject 30 Units into the skin at bedtime.)  . metFORMIN (GLUCOPHAGE) 500 MG tablet Take 1,000 mg by mouth 2 (two) times daily with a meal.  . Multiple Vitamin (MULTIVITAMIN WITH MINERALS) TABS tablet Take 1 tablet by mouth every  evening.   . ramipril (ALTACE) 2.5 MG capsule Take 2.5 mg by mouth daily.    Scheduled Meds: . aspirin EC  81 mg Oral Daily  . atorvastatin  10 mg Oral Daily  . carvedilol  3.125 mg Oral BID WC  . enoxaparin (LOVENOX) injection  40 mg Subcutaneous Daily  . insulin aspart  0-20 Units Subcutaneous TID WC  . insulin aspart  0-5 Units Subcutaneous QHS  . insulin glargine  15 Units Subcutaneous QHS  . LORazepam  0.5 mg Intravenous Once  . losartan  25 mg Oral Daily  . pneumococcal 23 valent vaccine  0.5 mL Intramuscular Tomorrow-1000  . sodium chloride flush  3 mL Intravenous Once   Continuous Infusions: PRN Meds:.   I/O last 3 completed shifts: In: 240 [P.O.:240] Out: 300 [Urine:300] Total I/O In: 240 [P.O.:240] Out: -     Radiology:   DG Chest 2 View  Result Date: 09/08/2020 CLINICAL DATA:  Weakness, lightheadedness, shortness of breath. Near syncope. EXAM: CHEST - 2 VIEW COMPARISON:  None. FINDINGS: The heart is enlarged. There is peribronchial and interstitial thickening. No significant pleural effusion. No confluent consolidation. No pneumothorax. Remote right rib fractures. No acute osseous abnormalities are seen. IMPRESSION: Cardiomegaly. Peribronchial and interstitial thickening, may be pulmonary edema or bronchitis. Electronically Signed   By: Narda Rutherford M.D.   On: 09/08/2020 20:16   CT HEAD WO CONTRAST  Result Date: 09/08/2020 CLINICAL DATA:  Mild generalized weakness, dizziness, near syncope and RIGHT leg weakness since yesterday, history diabetes mellitus EXAM: CT HEAD WITHOUT CONTRAST TECHNIQUE: Contiguous axial images were obtained from the base of the skull through the vertex without intravenous  contrast. COMPARISON:  None FINDINGS: Brain: Mild atrophy. Normal ventricular morphology. No midline shift or mass effect. Otherwise normal appearance of brain parenchyma. No intracranial hemorrhage, mass lesion, or evidence of acute infarction. No extra-axial fluid collections. Vascular: No hyperdense vessels. Skull: Demineralized but intact Sinuses/Orbits: Opacified mastoid air cells bilaterally. Paranasal sinuses clear. Other: N/A IMPRESSION: Generalized atrophy. No acute intracranial abnormalities. BILATERAL mastoid effusions. Electronically Signed   By: Ulyses Southward M.D.   On: 09/08/2020 17:57   Cardiac Studies:   Echocardiogram 09/09/2020:    1. Left ventricular ejection fraction, by estimation, is 30%. The left ventricle has moderate to severely decreased function. The left ventricle demonstrates global hypokinesis. The left ventricular internal cavity size was moderately dilated. Left  ventricular diastolic parameters are consistent with Grade I diastolic dysfunction (impaired relaxation).  2. Right ventricular systolic function was not well visualized. The right ventricular size is not well visualized.  3. The mitral valve is normal in structure. No evidence of mitral valve regurgitation. No evidence of mitral stenosis.  4. The aortic valve is tricuspid. Aortic valve regurgitation is not visualized. No aortic stenosis is present.  5. Aortic dilatation noted. There is mild dilatation of the aortic root, measuring 37 mm.  6. The inferior vena cava is dilated in size with >50% respiratory variability, suggesting right atrial pressure of 8 mmHg.  7. Technically difficult study with poor acoustic windows.  Compared to 12/14/2018, LVEF was normal.   EKG:  EKG 09/09/2020: Normal sinus rhythm at rate of 86 bpm, left axis deviation, left anterior fascicular block.  Right bundle branch block.  Bifascicular block.  Poor R wave progression.  Low-voltage complexes.  Frequent PVCs in bigeminal pattern.  No  ST-T wave changes of ischemia, pulmonary disease pattern.  Assessment   1.  New onset systolic heart failure 2.  Cardiomyopathy, dilated etiology unknown with multiple cardiovascular risk factors including hypertension, hyperlipidemia, diabetes mellitus, obesity, tobacco use disorder. 3.  Chronic lymphedema bilateral lower extremity 4.  Morbid obesity Recommendations:   ROE WILNER  is a 65 y.o. Caucasian male with hypertension, hyperlipidemia, type 2 diabetes mellitus, chronic lymphedema of the bilateral lower extremity, morbid obesity, tobacco use disorder quit in September 2021, who lives by himself and is presently disabled was admitted to the hospital with dizziness and generalized weakness in his legs.    He denied chest pain or shortness of breath.  He has no known coronary artery disease or peripheral arterial disease.  He has been semi-compliant in using his pneumatic pumps for his lymphedema.  He endorses no PND or orthopnea.  As LVEF is changed compared to year and a half ago, in view of multiple cardiovascular factors, underlying multivessel coronary disease need to be excluded.  Although he is not in any acute decompensated heart failure presently, not having active symptoms of heart failure or angina, given his morbid obesity, stress test may not be optimal.  We will schedule him for left and right heart catheterization today.  Schedule for cardiac catheterization, and possible angioplasty. We discussed regarding risks, benefits, alternatives to this including stress testing, CTA and continued medical therapy. Patient wants to proceed. Understands <1-2% risk of death, stroke, MI, urgent CABG, bleeding, infection, renal failure but not limited to these.   I have started him on losartan 25 mg in the evening and Coreg 3.125 mg twice daily.  He will need guideline directed medical therapy including probably starting him on Farxiga or Jardiance in view of his cardiac issues.    Compliance with lymphedema pump discussed.    [name], MD, [facility] 09/10/2020, 9:29 AM Office: [phone]

## 2020-09-10 NOTE — Progress Notes (Signed)
PROGRESS NOTE    [name]  TUU:[id number] DOB: 12-Dec-1955 DOA: 09/08/2020 PCP: [name], MD    Chief Complaint  Patient presents with  . Weakness    Brief Narrative: [name] a 65 y.o.malewith medical history significant forHTN,peripheral vascular insufficiency, insulin-dependent type 2 diabetes, hyperlipidemia, lymphedema with morbid obesity who presents with symptoms of lightheadedness and right lower extremity weakness. Pt reports that both his legs were weak.   Initial CT head without contrast is negative for acute stroke.  Follow up MRI brain ordered, but pt refused, saying he is claustrophobic, and will agree for open MRI only.  Echocardiogram showed ejection fraction of 30% with moderately severely decreased function, with global hypokinesis.  Left ventricular diastolic parameters are consistent with grade 1 diastolic dysfunction. Neurology consulted and is scheduled for cardiac catheterization later today   Assessment & Plan:   Principal Problem:   Chronic systolic heart failure (HCC) Active Problems:   DM (diabetes mellitus), type 2, uncontrolled (HCC)   Morbid obesity (HCC)   Syncope   Weakness of right lower extremity   Primary hypertension   Lymphedema   Cardiomyopathy (HCC)    Near syncope Unclear etiology, no arrhythmias is on telemetry. Cardiology consulted for abnormal echo, plan for cardiac cath later today.  Pt currently denies any chest pain or sob.    Bilateral lower extremity edema , as per the patient, chronic.  Chronic lymphedema Will get venous duplex to evaluate for DVT Follows up with wound clinic    Insulin-dependent type 2 diabetes mellitus Resume home dose of Lantus. A1c is 6.6%  Hyperlipidemia Continue with statin. Get lipid panel in the morning   Body mass index is 56.43 kg/m. Morbid obesity. Recommend outpatient follow up with PCP for weight loss and   DVT prophylaxis: Lovenox Code Status: Full  code Family Communication: None at bedside  disposition:   Status is: Observation  The patient will require care spanning > 2 midnights and should be moved to inpatient because: Ongoing diagnostic testing needed not appropriate for outpatient work up  Dispo: The patient is from: Home              Anticipated d/c is to: Home              Patient currently is not medically stable to d/c.   Difficult to place patient No       Consultants:   cardiology  Procedures:cardiac catheterization.   Antimicrobials:none.   Subjective: Pt reports some lightheadedness. Frustrated and anxious.   Objective: Vitals:   09/09/20 1421 09/10/20 0020 09/10/20 0633 09/10/20 1138  BP: (!) 152/82 133/90 (!) 129/91 124/80  Pulse: 79 77 63 77  Resp: 20 18 18 18   Temp: 97.9 F (36.6 C) 98.5 F (36.9 C) 97.8 F (36.6 C) 98.2 F (36.8 C)  TempSrc: Oral Oral Oral Oral  SpO2: 97%  95% 99%  Weight: (!) 194 kg     Height: 6\' 1"  (1.854 m)       Intake/Output Summary (Last 24 hours) at 09/10/2020 1542 Last data filed at 09/10/2020 0835 Gross per 24 hour  Intake 480 ml  Output 300 ml  Net 180 ml   Filed Weights   09/09/20 1419 09/09/20 1421  Weight: (!) 194 kg (!) 194 kg    Examination:  General exam: Appears calm and comfortable  Respiratory system: Clear to auscultation. Respiratory effort normal. Cardiovascular system: S1 & S2 heard, RRR. No JVD,  No pedal edema. Gastrointestinal system:  Abdomen is nondistended, soft and nontender.Normal bowel sounds heard. Central nervous system: Alert and oriented. No focal neurological deficits. Extremities: bilateral chronic leg edema.  Skin: No rashes, lesions or ulcers Psychiatry:  Anxious.      Data Reviewed: I have personally reviewed following labs and imaging studies  CBC: Recent Labs  Lab 09/08/20 1704 09/09/20 0344  WBC 8.2 7.7  NEUTROABS 6.3  --   HGB 16.3 15.9  HCT 48.2 47.9  MCV 86.1 86.3  PLT 167 176    Basic Metabolic  Panel: Recent Labs  Lab 09/08/20 1704 09/08/20 2223 09/09/20 0344  NA 135  --  136  K 4.5  --  3.8  CL 103  --  103  CO2 24  --  25  GLUCOSE 149*  --  132*  BUN 23  --  25*  CREATININE 0.98  --  0.86  CALCIUM 9.3  --  9.3  MG  --  2.0  --     GFR: Estimated Creatinine Clearance: 156.1 mL/min (by C-G formula based on SCr of 0.86 mg/dL).  Liver Function Tests: Recent Labs  Lab 09/08/20 1704  AST 15  ALT 15  ALKPHOS 63  BILITOT 0.9  PROT 7.2  ALBUMIN 3.9    CBG: Recent Labs  Lab 09/09/20 1321 09/09/20 1643 09/09/20 1914 09/10/20 0629 09/10/20 1214  GLUCAP 177* 128* 175* 144* 128*     Recent Results (from the past 240 hour(s))  Resp Panel by RT-PCR (Flu A&B, Covid) Nasopharyngeal Swab     Status: None   Collection Time: 09/08/20 10:17 PM   Specimen: Nasopharyngeal Swab; Nasopharyngeal(NP) swabs in vial transport medium  Result Value Ref Range Status   SARS Coronavirus 2 by RT PCR NEGATIVE NEGATIVE Final    Comment: (NOTE) SARS-CoV-2 target nucleic acids are NOT DETECTED.  The SARS-CoV-2 RNA is generally detectable in upper respiratory specimens during the acute phase of infection. The lowest concentration of SARS-CoV-2 viral copies this assay can detect is 138 copies/mL. A negative result does not preclude SARS-Cov-2 infection and should not be used as the sole basis for treatment or other patient management decisions. A negative result may occur with  improper specimen collection/handling, submission of specimen other than nasopharyngeal swab, presence of viral mutation(s) within the areas targeted by this assay, and inadequate number of viral copies(<138 copies/mL). A negative result must be combined with clinical observations, patient history, and epidemiological information. The expected result is Negative.  Fact Sheet for Patients:  BloggerCourse.com  Fact Sheet for Healthcare Providers:   SeriousBroker.it  This test is no t yet approved or cleared by the [place] FDA and  has been authorized for detection and/or diagnosis of SARS-CoV-2 by FDA under an Emergency Use Authorization (EUA). This EUA will remain  in effect (meaning this test can be used) for the duration of the COVID-19 declaration under Section 564(b)(1) of the Act, 21 U.S.C.section 360bbb-3(b)(1), unless the authorization is terminated  or revoked sooner.       Influenza A by PCR NEGATIVE NEGATIVE Final   Influenza B by PCR NEGATIVE NEGATIVE Final    Comment: (NOTE) The Xpert Xpress SARS-CoV-2/FLU/RSV plus assay is intended as an aid in the diagnosis of influenza from Nasopharyngeal swab specimens and should not be used as a sole basis for treatment. Nasal washings and aspirates are unacceptable for Xpert Xpress SARS-CoV-2/FLU/RSV testing.  Fact Sheet for Patients: BloggerCourse.com  Fact Sheet for Healthcare Providers: SeriousBroker.it  This test is not yet approved or  cleared by the [place] and has been authorized for detection and/or diagnosis of SARS-CoV-2 by FDA under an Emergency Use Authorization (EUA). This EUA will remain in effect (meaning this test can be used) for the duration of the COVID-19 declaration under Section 564(b)(1) of the Act, 21 U.S.C. section 360bbb-3(b)(1), unless the authorization is terminated or revoked.  Performed at [facility], [address] [place], [place] [address]          Radiology Studies: DG Chest 2 View  Result Date: 09/08/2020 CLINICAL DATA:  Weakness, lightheadedness, shortness of breath. Near syncope. EXAM: CHEST - 2 VIEW COMPARISON:  None. FINDINGS: The heart is enlarged. There is peribronchial and interstitial thickening. No significant pleural effusion. No confluent consolidation. No pneumothorax. Remote right rib fractures. No acute osseous  abnormalities are seen. IMPRESSION: Cardiomegaly. Peribronchial and interstitial thickening, may be pulmonary edema or bronchitis. Electronically Signed   By: Narda Rutherford M.D.   On: 09/08/2020 20:16   CT HEAD WO CONTRAST  Result Date: 09/08/2020 CLINICAL DATA:  Mild generalized weakness, dizziness, near syncope and RIGHT leg weakness since yesterday, history diabetes mellitus EXAM: CT HEAD WITHOUT CONTRAST TECHNIQUE: Contiguous axial images were obtained from the base of the skull through the vertex without intravenous contrast. COMPARISON:  None FINDINGS: Brain: Mild atrophy. Normal ventricular morphology. No midline shift or mass effect. Otherwise normal appearance of brain parenchyma. No intracranial hemorrhage, mass lesion, or evidence of acute infarction. No extra-axial fluid collections. Vascular: No hyperdense vessels. Skull: Demineralized but intact Sinuses/Orbits: Opacified mastoid air cells bilaterally. Paranasal sinuses clear. Other: N/A IMPRESSION: Generalized atrophy. No acute intracranial abnormalities. BILATERAL mastoid effusions. Electronically Signed   By: Ulyses Southward M.D.   On: 09/08/2020 17:57   ECHOCARDIOGRAM COMPLETE  Result Date: 09/09/2020    ECHOCARDIOGRAM REPORT   Patient Name:   ISEAH PLOUFF Date of Exam: 09/09/2020 Medical Rec #:  [id number]       Height:       73.0 in Accession #:    [id number]      Weight:       350.0 lb Date of Birth:  Jun 13, 1956       BSA:          2.729 m Patient Age:    64 years        BP:           130/83 mmHg Patient Gender: M               HR:           79 bpm. Exam Location:  Inpatient Procedure: 2D Echo, Cardiac Doppler, Color Doppler and Intracardiac            Opacification Agent Indications:    R55 Syncope  History:        Patient has prior history of Echocardiogram examinations, most                 recent 12/14/2018. Risk Factors:Diabetes.  Sonographer:    Elmarie Shiley Dance Referring Phys: [id number] CHING T TU  Sonographer Comments: Patient is  morbidly obese. IMPRESSIONS  1. Left ventricular ejection fraction, by estimation, is 30%. The left ventricle has moderate to severely decreased function. The left ventricle demonstrates global hypokinesis. The left ventricular internal cavity size was moderately dilated. Left ventricular diastolic parameters are consistent with Grade I diastolic dysfunction (impaired relaxation).  2. Right ventricular systolic function was not well visualized. The right ventricular size is not well visualized.  3. The mitral valve is normal in structure. No evidence of mitral valve regurgitation. No evidence of mitral stenosis.  4. The aortic valve is tricuspid. Aortic valve regurgitation is not visualized. No aortic stenosis is present.  5. Aortic dilatation noted. There is mild dilatation of the aortic root, measuring 37 mm.  6. The inferior vena cava is dilated in size with >50% respiratory variability, suggesting right atrial pressure of 8 mmHg.  7. Technically difficult study with poor acoustic windows. FINDINGS  Left Ventricle: Left ventricular ejection fraction, by estimation, is 30%. The left ventricle has moderate to severely decreased function. The left ventricle demonstrates global hypokinesis. Definity contrast agent was given IV to delineate the left ventricular endocardial borders. The left ventricular internal cavity size was moderately dilated. There is no left ventricular hypertrophy. Left ventricular diastolic parameters are consistent with Grade I diastolic dysfunction (impaired relaxation). Right Ventricle: The right ventricular size is not well visualized. Right vetricular wall thickness was not well visualized. Right ventricular systolic function was not well visualized. Left Atrium: Left atrial size was normal in size. Right Atrium: Right atrial size was normal in size. Pericardium: Trivial pericardial effusion is present. Mitral Valve: The mitral valve is normal in structure. No evidence of mitral valve  regurgitation. No evidence of mitral valve stenosis. Tricuspid Valve: The tricuspid valve is normal in structure. Tricuspid valve regurgitation is not demonstrated. Aortic Valve: The aortic valve is tricuspid. Aortic valve regurgitation is not visualized. No aortic stenosis is present. Pulmonic Valve: The pulmonic valve was normal in structure. Pulmonic valve regurgitation is not visualized. Aorta: Aortic dilatation noted. There is mild dilatation of the aortic root, measuring 37 mm. Venous: The inferior vena cava is dilated in size with greater than 50% respiratory variability, suggesting right atrial pressure of 8 mmHg. IAS/Shunts: No atrial level shunt detected by color flow Doppler.  LEFT VENTRICLE PLAX 2D LVIDd:         6.40 cm LVIDs:         5.80 cm LV PW:         1.30 cm LV IVS:        1.10 cm LVOT diam:     2.40 cm LV SV:         53 LV SV Index:   19 LVOT Area:     4.52 cm  RIGHT VENTRICLE         IVC TAPSE (M-mode): 2.4 cm  IVC diam: 3.00 cm LEFT ATRIUM           Index LA diam:      3.80 cm 1.39 cm/m LA Vol (A4C): 37.6 ml 13.78 ml/m  AORTIC VALVE LVOT Vmax:   66.15 cm/s LVOT Vmean:  40.800 cm/s LVOT VTI:    0.116 m  AORTA Ao Root diam: 3.70 cm Ao Asc diam:  3.85 cm MITRAL VALVE MV Area (PHT): 1.75 cm    SHUNTS MV Decel Time: 433 msec    Systemic VTI:  0.12 m MV E velocity: 53.10 cm/s  Systemic Diam: 2.40 cm MV A velocity: 59.60 cm/s MV E/A ratio:  0.89 Marca Anconaalton Mclean MD Electronically signed by Marca Anconaalton Mclean MD Signature Date/Time: 09/09/2020/3:23:14 PM    Final         Scheduled Meds: . [MAR Hold] aspirin EC  81 mg Oral Daily  . [MAR Hold] atorvastatin  10 mg Oral Daily  . [MAR Hold] carvedilol  3.125 mg Oral BID WC  . [MAR Hold] enoxaparin (LOVENOX) injection  40 mg Subcutaneous  Daily  . [MAR Hold] insulin aspart  0-20 Units Subcutaneous TID WC  . [MAR Hold] insulin aspart  0-5 Units Subcutaneous QHS  . [MAR Hold] insulin glargine  15 Units Subcutaneous QHS  . [MAR Hold] LORazepam  0.5 mg  Intravenous Once  . [MAR Hold] losartan  25 mg Oral Daily  . pneumococcal 23 valent vaccine  0.5 mL Intramuscular Tomorrow-1000  . [MAR Hold] sodium chloride flush  3 mL Intravenous Once  . [MAR Hold] sodium chloride flush  3 mL Intravenous Q12H   Continuous Infusions: . sodium chloride    . sodium chloride 75 mL/hr at 09/10/20 1320     LOS: 0 days        Kathlen Mody, MD [facility]   To contact the attending provider between 7A-7P or the covering provider during after hours 7P-7A, please log into the web site [email or web address] and access using universal Alligator password for that web site. If you do not have the password, please call the hospital operator.  09/10/2020, 3:42 PM

## 2020-09-10 NOTE — Consult Note (Signed)
CARDIOLOGY CONSULT NOTE  Patient ID: [name] MRN: [id number] DOB/AGE: 1956/05/03 65 y.o.  Admit date: 09/08/2020 Referring Physician  Kathlen Mody, MD Primary Physician:  [name], MD Reason for Consultation  Cardiomyopathy  Patient ID: [name], male    DOB: 1955/10/18, 65 y.o.   MRN: [id number]  Chief Complaint  Patient presents with  . Weakness   HPI:    [name]  is a 65 y.o. Caucasian male with hypertension, hyperlipidemia, type 2 diabetes mellitus, chronic lymphedema of the bilateral lower extremity, morbid obesity, tobacco use disorder quit in September 2021, who lives by himself and is presently disabled was admitted to the hospital with dizziness and generalized weakness in his legs.    He denied chest pain or shortness of breath.  He has no known coronary artery disease or peripheral arterial disease.  He has been semi-compliant in using his pneumatic pumps for his lymphedema.  He endorses no PND or orthopnea.  An echocardiogram was obtained which had revealed severely reduced LVEF and hence a consultation was placed no management of cardiomyopathy.  Past Medical History:  Diagnosis Date  . Arthritis   . Diabetes mellitus without complication (HCC)   . Hypertension   . Lymphedema 09/09/2020  . Wound drainage    right leg 3/4 inch area open wound with clear drainage last 2 days, covering with gauze prn   Past Surgical History:  Procedure Laterality Date  . COLONOSCOPY  2014 ?  Marland Kitchen FLEXIBLE SIGMOIDOSCOPY N/A 08/27/2014   Procedure: FLEXIBLE SIGMOIDOSCOPY;  Surgeon: [name], MD;  Location: [facility];  Service: Endoscopy;  Laterality: N/A;  . NO PAST SURGERIES     Social History   Tobacco Use  . Smoking status: Former Smoker    Packs/day: 1.00    Years: 30.00    Pack years: 30.00    Types: Cigarettes    Quit date: 05/03/2020    Years since quitting: 0.3  . Smokeless tobacco: Never Used  Substance Use Topics  . Alcohol use: No     Family History  Problem Relation Age of Onset  . Heart disease Mother   . Heart attack Father     Marital Sttus: Divorced  ROS  Review of Systems  Constitutional: Negative.  HENT: Negative.   Cardiovascular: Positive for leg swelling. Negative for chest pain, dyspnea on exertion, orthopnea, palpitations, paroxysmal nocturnal dyspnea and syncope.  Musculoskeletal: Positive for muscle weakness.  Gastrointestinal: Negative.  Negative for melena.  Genitourinary: Negative.   Neurological: Positive for dizziness and weakness (bilateral lower extremity).  Psychiatric/Behavioral: Negative.   All other systems reviewed and are negative.  Objective   Vitals with BMI 09/10/2020 09/10/2020 09/09/2020  Height - - 6\' 1"   Weight - - 427 lbs 11 oz  BMI - - 56.44  Systolic 129 133  Diastolic 91 90 82  Pulse 63 77 79    Blood pressure (!) 129/91, pulse 63, temperature 97.8 F (36.6 C), temperature source Oral, resp. rate 18, height 6\' 1"  (1.854 m), weight (!) 194 kg, SpO2 95 %.    Physical Exam Constitutional:      Comments: Morbidly obese in no acute distress.  Cardiovascular:     Rate and Rhythm: Normal rate and regular rhythm.     Pulses:          Carotid pulses are 2+ on the right side and 2+ on the left side.      Dorsalis pedis pulses are 1+ on  the right side and 2+ on the left side.       Posterior tibial pulses are 0 on the right side and 0 on the left side.     Heart sounds: Normal heart sounds. No murmur heard. No gallop.      Comments: Femoral and popliteal pulse difficult to feel due to patient's body habitus.  No JVD Pulmonary:     Effort: Pulmonary effort is normal.     Breath sounds: Normal breath sounds.  Abdominal:     General: Bowel sounds are normal.     Palpations: Abdomen is soft.     Comments: Obese. Pannus present  Musculoskeletal:     Cervical back: Normal range of motion and neck supple.     Right lower leg: Edema (non pitting. Chronic venostasis  changes noted) present.     Left lower leg: Edema (non pitting. Chronic venostasis changes noted) present.    Laboratory examination:   Recent Labs    09/08/20 1704 09/09/20 0344  NA 135 136  K 4.5 3.8  CL 103 103  CO2 24 25  GLUCOSE 149* 132*  BUN 23 25*  CREATININE 0.98 0.86  CALCIUM 9.3 9.3  GFRNONAA >60 >60   estimated creatinine clearance is 156.1 mL/min (by C-G formula based on SCr of 0.86 mg/dL).  CMP Latest Ref Rng & Units 09/09/2020 09/08/2020 07/17/2013  Glucose 70 - 99 mg/dL 132(H) 149(H) 129(H)  BUN 8 - 23 mg/dL 25(H) 23 9  Creatinine 0.61 - 1.24 mg/dL 0.86 0.98 0.79  Sodium 135 - 145 mmol/L 136 135 143  Potassium 3.5 - 5.1 mmol/L 3.8 4.5 3.5(L)  Chloride 98 - 111 mmol/L 103 103 107  CO2 22 - 32 mmol/L 25 24 26  Calcium 8.9 - 10.3 mg/dL 9.3 9.3 7.8(L)  Total Protein 6.5 - 8.1 g/dL - 7.2 -  Total Bilirubin 0.3 - 1.2 mg/dL - 0.9 -  Alkaline Phos 38 - 126 U/L - 63 -  AST 15 - 41 U/L - 15 -  ALT 0 - 44 U/L - 15 -   CBC Latest Ref Rng & Units 09/09/2020 09/08/2020 07/17/2013  WBC 4.0 - 10.5 K/uL 7.7 8.2 6.2  Hemoglobin 13.0 - 17.0 g/dL 15.9 16.3 12.9(L)  Hematocrit 39.0 - 52.0 % 47.9 48.2 40.1  Platelets 150 - 400 K/uL 176 167 257   Lipid Panel No results for input(s): CHOL, TRIG, LDLCALC, VLDL, HDL, CHOLHDL, LDLDIRECT in the last 8760 hours.  HEMOGLOBIN A1C Lab Results  Component Value Date   HGBA1C 6.6 (H) 09/09/2020   MPG 142.72 09/09/2020   TSH Recent Labs    09/08/20 2258  TSH 1.420   BNP (last 3 results) Recent Labs    09/08/20 2223  BNP 104.2*   Medications and allergies  No Known Allergies   Current Meds  Medication Sig  . aspirin EC 81 MG tablet Take 81 mg by mouth daily.  . atorvastatin (LIPITOR) 10 MG tablet Take 10 mg by mouth at bedtime.  . furosemide (LASIX) 20 MG tablet Take 20 mg by mouth 2 (two) times a week. Wednesday and Sunday  . insulin glargine (LANTUS) 100 UNIT/ML injection Inject 0.5 mLs (50 Units total) into the skin at  bedtime. (Patient taking differently: Inject 30 Units into the skin at bedtime.)  . metFORMIN (GLUCOPHAGE) 500 MG tablet Take 1,000 mg by mouth 2 (two) times daily with a meal.  . Multiple Vitamin (MULTIVITAMIN WITH MINERALS) TABS tablet Take 1 tablet by mouth every   evening.   . ramipril (ALTACE) 2.5 MG capsule Take 2.5 mg by mouth daily.    Scheduled Meds: . aspirin EC  81 mg Oral Daily  . atorvastatin  10 mg Oral Daily  . carvedilol  3.125 mg Oral BID WC  . enoxaparin (LOVENOX) injection  40 mg Subcutaneous Daily  . insulin aspart  0-20 Units Subcutaneous TID WC  . insulin aspart  0-5 Units Subcutaneous QHS  . insulin glargine  15 Units Subcutaneous QHS  . LORazepam  0.5 mg Intravenous Once  . losartan  25 mg Oral Daily  . pneumococcal 23 valent vaccine  0.5 mL Intramuscular Tomorrow-1000  . sodium chloride flush  3 mL Intravenous Once   Continuous Infusions: PRN Meds:.   I/O last 3 completed shifts: In: 240 [P.O.:240] Out: 300 [Urine:300] Total I/O In: 240 [P.O.:240] Out: -     Radiology:   DG Chest 2 View  Result Date: 09/08/2020 CLINICAL DATA:  Weakness, lightheadedness, shortness of breath. Near syncope. EXAM: CHEST - 2 VIEW COMPARISON:  None. FINDINGS: The heart is enlarged. There is peribronchial and interstitial thickening. No significant pleural effusion. No confluent consolidation. No pneumothorax. Remote right rib fractures. No acute osseous abnormalities are seen. IMPRESSION: Cardiomegaly. Peribronchial and interstitial thickening, may be pulmonary edema or bronchitis. Electronically Signed   By: Narda Rutherford M.D.   On: 09/08/2020 20:16   CT HEAD WO CONTRAST  Result Date: 09/08/2020 CLINICAL DATA:  Mild generalized weakness, dizziness, near syncope and RIGHT leg weakness since yesterday, history diabetes mellitus EXAM: CT HEAD WITHOUT CONTRAST TECHNIQUE: Contiguous axial images were obtained from the base of the skull through the vertex without intravenous  contrast. COMPARISON:  None FINDINGS: Brain: Mild atrophy. Normal ventricular morphology. No midline shift or mass effect. Otherwise normal appearance of brain parenchyma. No intracranial hemorrhage, mass lesion, or evidence of acute infarction. No extra-axial fluid collections. Vascular: No hyperdense vessels. Skull: Demineralized but intact Sinuses/Orbits: Opacified mastoid air cells bilaterally. Paranasal sinuses clear. Other: N/A IMPRESSION: Generalized atrophy. No acute intracranial abnormalities. BILATERAL mastoid effusions. Electronically Signed   By: Ulyses Southward M.D.   On: 09/08/2020 17:57   Cardiac Studies:   Echocardiogram 09/09/2020:    1. Left ventricular ejection fraction, by estimation, is 30%. The left ventricle has moderate to severely decreased function. The left ventricle demonstrates global hypokinesis. The left ventricular internal cavity size was moderately dilated. Left  ventricular diastolic parameters are consistent with Grade I diastolic dysfunction (impaired relaxation).  2. Right ventricular systolic function was not well visualized. The right ventricular size is not well visualized.  3. The mitral valve is normal in structure. No evidence of mitral valve regurgitation. No evidence of mitral stenosis.  4. The aortic valve is tricuspid. Aortic valve regurgitation is not visualized. No aortic stenosis is present.  5. Aortic dilatation noted. There is mild dilatation of the aortic root, measuring 37 mm.  6. The inferior vena cava is dilated in size with >50% respiratory variability, suggesting right atrial pressure of 8 mmHg.  7. Technically difficult study with poor acoustic windows.  Compared to 12/14/2018, LVEF was normal.   EKG:  EKG 09/09/2020: Normal sinus rhythm at rate of 86 bpm, left axis deviation, left anterior fascicular block.  Right bundle branch block.  Bifascicular block.  Poor R wave progression.  Low-voltage complexes.  Frequent PVCs in bigeminal pattern.  No  ST-T wave changes of ischemia, pulmonary disease pattern.  Assessment   1.  New onset systolic heart failure 2.  Cardiomyopathy, dilated etiology unknown with multiple cardiovascular risk factors including hypertension, hyperlipidemia, diabetes mellitus, obesity, tobacco use disorder. 3.  Chronic lymphedema bilateral lower extremity 4.  Morbid obesity Recommendations:   [name]  is a 65 y.o. Caucasian male with hypertension, hyperlipidemia, type 2 diabetes mellitus, chronic lymphedema of the bilateral lower extremity, morbid obesity, tobacco use disorder quit in September 2021, who lives by himself and is presently disabled was admitted to the hospital with dizziness and generalized weakness in his legs.    He denied chest pain or shortness of breath.  He has no known coronary artery disease or peripheral arterial disease.  He has been semi-compliant in using his pneumatic pumps for his lymphedema.  He endorses no PND or orthopnea.  As LVEF is changed compared to year and a half ago, in view of multiple cardiovascular factors, underlying multivessel coronary disease need to be excluded.  Although he is not in any acute decompensated heart failure presently, not having active symptoms of heart failure or angina, given his morbid obesity, stress test may not be optimal.  We will schedule him for left and right heart catheterization today.  Schedule for cardiac catheterization, and possible angioplasty. We discussed regarding risks, benefits, alternatives to this including stress testing, CTA and continued medical therapy. Patient wants to proceed. Understands <1-2% risk of death, stroke, MI, urgent CABG, bleeding, infection, renal failure but not limited to these.   I have started him on losartan 25 mg in the evening and Coreg 3.125 mg twice daily.  He will need guideline directed medical therapy including probably starting him on Farxiga or Jardiance in view of his cardiac issues.    Compliance with lymphedema pump discussed.    [name], MD, [facility] 09/10/2020, 9:29 AM Office: [phone]

## 2020-09-11 ENCOUNTER — Observation Stay (HOSPITAL_BASED_OUTPATIENT_CLINIC_OR_DEPARTMENT_OTHER): Payer: PPO

## 2020-09-11 DIAGNOSIS — R609 Edema, unspecified: Secondary | ICD-10-CM | POA: Diagnosis not present

## 2020-09-11 DIAGNOSIS — I5022 Chronic systolic (congestive) heart failure: Secondary | ICD-10-CM | POA: Diagnosis not present

## 2020-09-11 LAB — GLUCOSE, CAPILLARY
Glucose-Capillary: 127 mg/dL — ABNORMAL HIGH (ref 70–99)
Glucose-Capillary: 117 mg/dL — ABNORMAL HIGH (ref 70–99)

## 2020-09-11 MED ORDER — CARVEDILOL 6.25 MG PO TABS: 6.2500 mg | ORAL_TABLET | Freq: Two times a day (BID) | ORAL | 1 refills | Status: DC

## 2020-09-11 MED ORDER — CARVEDILOL 3.125 MG PO TABS: 3.1250 mg | ORAL_TABLET | Freq: Two times a day (BID) | ORAL | 2 refills | Status: DC

## 2020-09-11 MED FILL — Midazolam HCl Inj 2 MG/2ML (Base Equivalent): INTRAMUSCULAR | Qty: 2 | Status: AC

## 2020-09-11 NOTE — Progress Notes (Signed)
Heart Failure Nurse Navigator Progress Note  PCP: [name], MD PCP-Cardiologist: [facility] [facility] (new) Admission Diagnosis: near syncope Admitted from: home alone  Presentation:   [name] presented with lightheadedness and BLE weakness. Completed interview via phone. Pt stated this information is all new to him regarding cardiac issues and heart failure.   ECHO/ LVEF: <30%  Clinical Course:  Past Medical History:  Diagnosis Date  . Arthritis   . Diabetes mellitus without complication (HCC)   . Hypertension   . Lymphedema 09/09/2020  . Wound drainage    right leg 3/4 inch area open wound with clear drainage last 2 days, covering with gauze prn    Social History   Socioeconomic History  . Marital status: Divorced    Spouse name: Not on file  . Number of children: Not on file  . Years of education: Not on file  . Highest education level: Not on file  Occupational History  . Occupation: disability  Tobacco Use  . Smoking status: Former Smoker    Packs/day: 1.50    Years: 42.00    Pack years: 63.00    Types: Cigarettes    Quit date: 05/03/2020    Years since quitting: 0.3  . Smokeless tobacco: Never Used  . Tobacco comment: congratulated and encouraged continued cessation.  Vaping Use  . Vaping Use: Never used  Substance and Sexual Activity  . Alcohol use: No  . Drug use: No  . Sexual activity: Yes  Other Topics Concern  . Not on file  Social History Narrative  . Not on file   Social Determinants of Health   Financial Resource Strain: Low Risk   . Difficulty of Paying Living Expenses: Not very hard  Food Insecurity: No Food Insecurity  . Worried About Programme researcher, broadcasting/film/video in the Last Year: Never true  . Ran Out of Food in the Last Year: Never true  Transportation Needs: No Transportation Needs  . Lack of Transportation (Medical): No  . Lack of Transportation (Non-Medical): No  Physical Activity: Inactive  . Days of Exercise per Week: 0 days  .  Minutes of Exercise per Session: 0 min  Stress: Not on file  Social Connections: Not on file   High Risk Criteria for Readmission and/or Poor Patient Outcomes:  Heart failure hospital admissions (last 6 months): 1   No Show rate: 0%  Difficult social situation: no  Demonstrates medication adherence: yes  Primary Language: English  Literacy level: able to read/write and comprehend .hfnaved Barriers of Care:   -new HF dx -functional status  Considerations/Referrals:   Referral made to Heart Failure Pharmacist Stewardship: yes, to see at [facility] appt Referral made to Heart & Vascular TOC clinic: yes, 4/5 @ 9AM. Confirmed pt has transportation.  Items for Follow-up on DC/TOC: -medication optimization -HF education/lifestyle modifications (needs book, scale if weight appropriate) -medication cost  [name], RN, [name] Heart Failure Nurse Navigator [phone]  Pt asked to call and reschedule [facility] appt to the following week or end of April. Navigator contacted [facility]: appt moved to 4/19 @ 10:30 with [place], [place]. Emailed patient information regarding new/changed appts per pt request. Also gave directions to [facility] clinic appt.

## 2020-09-11 NOTE — Progress Notes (Signed)
PT Cancellation Note  Patient Details Name: [name] MRN: [id number] DOB: 10-Apr-1956   Cancelled Treatment:    Reason Eval/Treat Not Completed: Other (comment).  Declining PT, he is discharged, at baseline.  Discharge PT today.     Ivar Drape 09/11/2020, 12:12 PM   Samul Dada, PT MS Acute Rehab Dept. Number: [facility] [id number] and [facility] [phone]

## 2020-09-11 NOTE — CV Procedure (Signed)
BLE venous duplex completed.  Results can be found under chart review under CV PROC. 09/11/2020 11:51 AM Chantay Whitelock RVT, RDMS

## 2020-09-11 NOTE — Plan of Care (Signed)
  Problem: Education: Goal: Knowledge of General Education information will improve Description: Including pain rating scale, medication(s)/side effects and non-pharmacologic comfort measures Outcome: Adequate for Discharge   Problem: Education: Goal: Knowledge of General Education information will improve Description: Including pain rating scale, medication(s)/side effects and non-pharmacologic comfort measures Outcome: Adequate for Discharge   Problem: Health Behavior/Discharge Planning: Goal: Ability to manage health-related needs will improve Outcome: Adequate for Discharge   Problem: Clinical Measurements: Goal: Ability to maintain clinical measurements within normal limits will improve Outcome: Adequate for Discharge Goal: Will remain free from infection Outcome: Adequate for Discharge Goal: Diagnostic test results will improve Outcome: Adequate for Discharge Goal: Respiratory complications will improve Outcome: Adequate for Discharge Goal: Cardiovascular complication will be avoided Outcome: Adequate for Discharge   Problem: Activity: Goal: Risk for activity intolerance will decrease Outcome: Adequate for Discharge   Problem: Nutrition: Goal: Adequate nutrition will be maintained Outcome: Adequate for Discharge   Problem: Coping: Goal: Level of anxiety will decrease Outcome: Adequate for Discharge   Problem: Elimination: Goal: Will not experience complications related to bowel motility Outcome: Adequate for Discharge Goal: Will not experience complications related to urinary retention Outcome: Adequate for Discharge   Problem: Pain Managment: Goal: General experience of comfort will improve Outcome: Adequate for Discharge   Problem: Safety: Goal: Ability to remain free from injury will improve Outcome: Adequate for Discharge   Problem: Skin Integrity: Goal: Risk for impaired skin integrity will decrease Outcome: Adequate for Discharge   Problem:  Education: Goal: Knowledge of condition and prescribed therapy will improve Outcome: Adequate for Discharge   Problem: Cardiac: Goal: Will achieve and/or maintain adequate cardiac output Outcome: Adequate for Discharge   Problem: Physical Regulation: Goal: Complications related to the disease process, condition or treatment will be avoided or minimized Outcome: Adequate for Discharge

## 2020-09-11 NOTE — Discharge Summary (Signed)
Physician Discharge Summary  KORREY COPPS AVW:[id number] DOB: Oct 08, 1955 DOA: 09/08/2020  PCP: [name], MD  Admit date: 09/08/2020 Discharge date: 09/11/2020  Admitted From: Home Disposition: Home  Recommendations for Outpatient Follow-up:  1. Follow up with PCP in 1-2 weeks 2. Please obtain BMP/CBC in one week 3. Cardiology will schedule follow-up with you  Home Health: Not applicable Equipment/Devices: At home  Discharge Condition: Stable CODE STATUS: Full code Diet recommendation: Low-salt, low-carb diet  Discharge summary: 65 year old gentleman with history of hypertension, peripheral vascular disease, morbid obesity, insulin-dependent diabetes, hyperlipidemia, bilateral lymphedema presented to the hospital with lightheadedness and transient right lower extremity weakness.  Later on he complained that both of his legs were weak.  Initial CT head without contrast negative for acute a stroke.  Echocardiogram showed ejection fraction 30% with global hypokinesis.  Due to new findings of cardiomyopathy, he was admitted to the hospital and underwent cardiac cath.  Nonischemic cardiomyopathy: Underwent cardiac catheterization, Dr. Jacinto Halim on 3/29 with normal coronary.  Echocardiogram 30%.  Euvolemic on exam.  Lower extremity duplex is negative for DVT.  Initial CT head was normal.  No arrhythmias on telemetry.  Diagnosed as nonischemic cardiomyopathy. Patient is already on ramipril that he will continue.  Cardiology recommended increasing dose of carvedilol 6.25 mg twice a day.  Patient is on aspirin 81 mg daily that he will continue.  Patient will continue to take twice a week Lasix.  Patient is already on Lipitor 10 mg daily that he will continue. His blood sugars are well controlled on current doses of Metformin and insulin.  She will start Metformin tomorrow 3/31.  Patient has no evidence of neurological deficits.  Unlikely TIA or stroke.  Initially plan for MRI, however patient  declined.  Cardiology will schedule follow-up with him.    Discharge Diagnoses:  Principal Problem:   Chronic systolic heart failure (HCC) Active Problems:   DM (diabetes mellitus), type 2, uncontrolled (HCC)   Morbid obesity (HCC)   Syncope   Weakness of right lower extremity   Primary hypertension   Lymphedema   Cardiomyopathy [facility])    Discharge Instructions  Discharge Instructions    Call MD for:  difficulty breathing, headache or visual disturbances   Complete by: As directed    Call MD for:  persistant dizziness or light-headedness   Complete by: As directed    Diet - low sodium heart healthy   Complete by: As directed    Diet Carb Modified   Complete by: As directed    Increase activity slowly   Complete by: As directed      Allergies as of 09/11/2020   No Known Allergies     Medication List    TAKE these medications   aspirin EC 81 MG tablet Take 81 mg by mouth daily.   atorvastatin 10 MG tablet Commonly known as: LIPITOR Take 10 mg by mouth at bedtime.   carvedilol 6.25 MG tablet Commonly known as: COREG Take 1 tablet (6.25 mg total) by mouth 2 (two) times daily with a meal.   furosemide 20 MG tablet Commonly known as: LASIX Take 20 mg by mouth 2 (two) times a week. Wednesday and Sunday   insulin glargine 100 UNIT/ML injection Commonly known as: LANTUS Inject 0.5 mLs (50 Units total) into the skin at bedtime. What changed: how much to take   metFORMIN 500 MG tablet Commonly known as: GLUCOPHAGE Take 1,000 mg by mouth 2 (two) times daily with a meal.   multivitamin  with minerals Tabs tablet Take 1 tablet by mouth every evening.   ramipril 2.5 MG capsule Commonly known as: ALTACE Take 2.5 mg by mouth daily.       Follow-up Information    Rayford Halsted, PA-C On 09/24/2020.   Specialty: Cardiology Why: 10:30 AM. Please bring all medications Contact information: [address] [place] [address] [phone]               No Known Allergies  Consultations:  Cardiology   Procedures/Studies: DG Chest 2 View  Result Date: 09/08/2020 CLINICAL DATA:  Weakness, lightheadedness, shortness of breath. Near syncope. EXAM: CHEST - 2 VIEW COMPARISON:  None. FINDINGS: The heart is enlarged. There is peribronchial and interstitial thickening. No significant pleural effusion. No confluent consolidation. No pneumothorax. Remote right rib fractures. No acute osseous abnormalities are seen. IMPRESSION: Cardiomegaly. Peribronchial and interstitial thickening, may be pulmonary edema or bronchitis. Electronically Signed   By: Narda Rutherford M.D.   On: 09/08/2020 20:16   CT HEAD WO CONTRAST  Result Date: 09/08/2020 CLINICAL DATA:  Mild generalized weakness, dizziness, near syncope and RIGHT leg weakness since yesterday, history diabetes mellitus EXAM: CT HEAD WITHOUT CONTRAST TECHNIQUE: Contiguous axial images were obtained from the base of the skull through the vertex without intravenous contrast. COMPARISON:  None FINDINGS: Brain: Mild atrophy. Normal ventricular morphology. No midline shift or mass effect. Otherwise normal appearance of brain parenchyma. No intracranial hemorrhage, mass lesion, or evidence of acute infarction. No extra-axial fluid collections. Vascular: No hyperdense vessels. Skull: Demineralized but intact Sinuses/Orbits: Opacified mastoid air cells bilaterally. Paranasal sinuses clear. Other: N/A IMPRESSION: Generalized atrophy. No acute intracranial abnormalities. BILATERAL mastoid effusions. Electronically Signed   By: Ulyses Southward M.D.   On: 09/08/2020 17:57   CARDIAC CATHETERIZATION  Result Date: 09/10/2020 Right + Left Heart Catheterization 09/10/20 Hemodynamic data: RA 12/11, mean 8 mmHg.  RA saturation 77%.  RV 37/5, EDP 10 mmHg.  PA 35/22, mean 28 mmHg.  PA saturation 81%.  PW 18/18, mean 16 mmHg.  Aortic saturation 98%. QT/QS 1.24 suggest insignificant left-to-right shunting.  May be at the  right atrial level. Cardiac output 8.69, cardiac index 2.93 by Fick. LV 145/13, EDP 21 mmHg.  Aorta 112/25 with a mean of 91 mmHg. No pressure gradient across the aortic valve. Angiographic data: LV: Dilated.  Global hypokinesis.  EF 30 to 35%.  EDP mildly elevated. Right dominant circulation.  No significant coronary artery disease with D1 being equivalent to ramus intermediate, large vessel.  All vessels are mild luminal irregularity. 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  He also has left to right shunt however may be clinically insignificant.  Shunt potentially could be at the level of right atrium. Recommendation: Therapy for nonischemic dilated cardiomyopathy.  He is well compensated.  Can be discharged at any point from cardiac standpoint.  Outpatient follow-up has been set up.  ECHOCARDIOGRAM COMPLETE  Result Date: 09/09/2020    ECHOCARDIOGRAM REPORT   Patient Name:   LONNIE RETH Date of Exam: 09/09/2020 Medical Rec #:  [id number]       Height:       73.0 in Accession #:    [id number]      Weight:       350.0 lb Date of Birth:  [birth year]       BSA:          2.729  m Patient Age:    64 years        BP:           130/83 mmHg Patient Gender: M               HR:           79 bpm. Exam Location:  Inpatient Procedure: 2D Echo, Cardiac Doppler, Color Doppler and Intracardiac            Opacification Agent Indications:    R55 Syncope  History:        Patient has prior history of Echocardiogram examinations, most                 recent 12/14/2018. Risk Factors:Diabetes.  Sonographer:    Elmarie Shiley Dance Referring Phys: [id number] CHING T TU  Sonographer Comments: Patient is morbidly obese. IMPRESSIONS  1. Left ventricular ejection fraction, by estimation, is 30%. The left ventricle has moderate to severely decreased function. The left ventricle demonstrates global hypokinesis. The left  ventricular internal cavity size was moderately dilated. Left ventricular diastolic parameters are consistent with Grade I diastolic dysfunction (impaired relaxation).  2. Right ventricular systolic function was not well visualized. The right ventricular size is not well visualized.  3. The mitral valve is normal in structure. No evidence of mitral valve regurgitation. No evidence of mitral stenosis.  4. The aortic valve is tricuspid. Aortic valve regurgitation is not visualized. No aortic stenosis is present.  5. Aortic dilatation noted. There is mild dilatation of the aortic root, measuring 37 mm.  6. The inferior vena cava is dilated in size with >50% respiratory variability, suggesting right atrial pressure of 8 mmHg.  7. Technically difficult study with poor acoustic windows. FINDINGS  Left Ventricle: Left ventricular ejection fraction, by estimation, is 30%. The left ventricle has moderate to severely decreased function. The left ventricle demonstrates global hypokinesis. Definity contrast agent was given IV to delineate the left ventricular endocardial borders. The left ventricular internal cavity size was moderately dilated. There is no left ventricular hypertrophy. Left ventricular diastolic parameters are consistent with Grade I diastolic dysfunction (impaired relaxation). Right Ventricle: The right ventricular size is not well visualized. Right vetricular wall thickness was not well visualized. Right ventricular systolic function was not well visualized. Left Atrium: Left atrial size was normal in size. Right Atrium: Right atrial size was normal in size. Pericardium: Trivial pericardial effusion is present. Mitral Valve: The mitral valve is normal in structure. No evidence of mitral valve regurgitation. No evidence of mitral valve stenosis. Tricuspid Valve: The tricuspid valve is normal in structure. Tricuspid valve regurgitation is not demonstrated. Aortic Valve: The aortic valve is tricuspid. Aortic  valve regurgitation is not visualized. No aortic stenosis is present. Pulmonic Valve: The pulmonic valve was normal in structure. Pulmonic valve regurgitation is not visualized. Aorta: Aortic dilatation noted. There is mild dilatation of the aortic root, measuring 37 mm. Venous: The inferior vena cava is dilated in size with greater than 50% respiratory variability, suggesting right atrial pressure of 8 mmHg. IAS/Shunts: No atrial level shunt detected by color flow Doppler.  LEFT VENTRICLE PLAX 2D LVIDd:         6.40 cm LVIDs:         5.80 cm LV PW:         1.30 cm LV IVS:        1.10 cm LVOT diam:     2.40 cm LV SV:  53 LV SV Index:   19 LVOT Area:     4.52 cm  RIGHT VENTRICLE         IVC TAPSE (M-mode): 2.4 cm  IVC diam: 3.00 cm LEFT ATRIUM           Index LA diam:      3.80 cm 1.39 cm/m LA Vol (A4C): 37.6 ml 13.78 ml/m  AORTIC VALVE LVOT Vmax:   66.15 cm/s LVOT Vmean:  40.800 cm/s LVOT VTI:    0.116 m  AORTA Ao Root diam: 3.70 cm Ao Asc diam:  3.85 cm MITRAL VALVE MV Area (PHT): 1.75 cm    SHUNTS MV Decel Time: 433 msec    Systemic VTI:  0.12 m MV E velocity: 53.10 cm/s  Systemic Diam: 2.40 cm MV A velocity: 59.60 cm/s MV E/A ratio:  0.89 Marca Ancona MD Electronically signed by Marca Ancona MD Signature Date/Time: 09/09/2020/3:23:14 PM    Final    VAS [place] LOWER EXTREMITY VENOUS (DVT)  Result Date: 09/11/2020  Lower Venous DVT Study Indications: Edema.  Risk Factors: Lymphedema, CHF. Limitations: Body habitus and poor ultrasound/tissue interface. Comparison Study: No previous exams Performing Technologist: Ernestene Mention  Examination Guidelines: A complete evaluation includes B-mode imaging, spectral Doppler, color Doppler, and power Doppler as needed of all accessible portions of each vessel. Bilateral testing is considered an integral part of a complete examination. Limited examinations for reoccurring indications may be performed as noted. The reflux portion of the exam is performed with the patient  in reverse Trendelenburg.  +---------+---------------+---------+-----------+----------+------------------+ RIGHT    CompressibilityPhasicitySpontaneityPropertiesThrombus Aging     +---------+---------------+---------+-----------+----------+------------------+ CFV      Full           Yes      Yes                                     +---------+---------------+---------+-----------+----------+------------------+ SFJ      Full                                                            +---------+---------------+---------+-----------+----------+------------------+ FV Prox  Full           Yes      Yes                                     +---------+---------------+---------+-----------+----------+------------------+ FV Mid   Full           Yes      Yes                                     +---------+---------------+---------+-----------+----------+------------------+ FV DistalFull           Yes      Yes                                     +---------+---------------+---------+-----------+----------+------------------+ PFV      Full                                                            +---------+---------------+---------+-----------+----------+------------------+  POP      Full           Yes      Yes                                     +---------+---------------+---------+-----------+----------+------------------+ PTV      Full                                         Not well                                                                 visualized         +---------+---------------+---------+-----------+----------+------------------+ PERO                                                  Not seen on this                                                         exam               +---------+---------------+---------+-----------+----------+------------------+   Right Technical Findings: Not visualized segments include Peroneal veins.   +---------+---------------+---------+-----------+----------+------------------+ LEFT     CompressibilityPhasicitySpontaneityPropertiesThrombus Aging     +---------+---------------+---------+-----------+----------+------------------+ CFV      Full           Yes      Yes                                     +---------+---------------+---------+-----------+----------+------------------+ SFJ      Full                                                            +---------+---------------+---------+-----------+----------+------------------+ FV Prox  Full           Yes      Yes                                     +---------+---------------+---------+-----------+----------+------------------+ FV Mid   Full           Yes      Yes                                     +---------+---------------+---------+-----------+----------+------------------+ FV DistalFull           Yes      Yes                                     +---------+---------------+---------+-----------+----------+------------------+  PFV      Full                                                            +---------+---------------+---------+-----------+----------+------------------+ POP      Full           Yes      Yes                                     +---------+---------------+---------+-----------+----------+------------------+ PTV      Full                                         Not well                                                                 visualized         +---------+---------------+---------+-----------+----------+------------------+ PERO                                                  Not seen on this                                                         exam               +---------+---------------+---------+-----------+----------+------------------+   Left Technical Findings: Not visualized segments include Peroneal veins.   Summary: BILATERAL: -No evidence  of popliteal cyst, bilaterally. RIGHT: - There is no evidence of deep vein thrombosis in the lower extremity. However, portions of this examination were limited- see technologist comments above.  LEFT: - There is no evidence of deep vein thrombosis in the lower extremity. However, portions of this examination were limited- see technologist comments above.  *See table(s) above for measurements and observations.    Preliminary    (Echo, Carotid, EGD, Colonoscopy, ERCP)    Subjective: Patient seen and examined.  Denies any chest pain or shortness of breath.  He is eager to go home.  Walking around the room, on room air.   Discharge Exam: Vitals:   09/11/20 0735 09/11/20 1146  BP: 132/77 115/84  Pulse:  71  Resp:  18  Temp:  97.9 F (36.6 C)  SpO2:  98%   Vitals:   09/10/20 1955 09/11/20 0332 09/11/20 0735 09/11/20 1146  BP: 131/78 140/73 132/77 115/84  Pulse: 75   71  Resp: 16 18  18   Temp: 97.9 F (36.6 C) 98.2 F (36.8 C)  97.9 F (36.6 C)  TempSrc: Oral Oral  Oral  SpO2: 95% 98%  98%  Weight:  (!) 192.1 kg  Height:        General: Pt is alert, awake, not in acute distress Cardiovascular: RRR, S1/S2 +, no rubs, no gallops Respiratory: CTA bilaterally, no wheezing, no rhonchi Abdominal: Soft, NT, ND, bowel sounds +, obese and pendulous. Extremities: Patient has chronic lymphedema both legs, venous stasis changes.  Nontender.  No pedal edema.    The results of significant diagnostics from this hospitalization (including imaging, microbiology, ancillary and laboratory) are listed below for reference.     Microbiology: Recent Results (from the past 240 hour(s))  Resp Panel by RT-PCR (Flu A&B, Covid) Nasopharyngeal Swab     Status: None   Collection Time: 09/08/20 10:17 PM   Specimen: Nasopharyngeal Swab; Nasopharyngeal(NP) swabs in vial transport medium  Result Value Ref Range Status   SARS Coronavirus 2 by RT PCR NEGATIVE NEGATIVE Final    Comment: (NOTE) SARS-CoV-2  target nucleic acids are NOT DETECTED.  The SARS-CoV-2 RNA is generally detectable in upper respiratory specimens during the acute phase of infection. The lowest concentration of SARS-CoV-2 viral copies this assay can detect is 138 copies/mL. A negative result does not preclude SARS-Cov-2 infection and should not be used as the sole basis for treatment or other patient management decisions. A negative result may occur with  improper specimen collection/handling, submission of specimen other than nasopharyngeal swab, presence of viral mutation(s) within the areas targeted by this assay, and inadequate number of viral copies(<138 copies/mL). A negative result must be combined with clinical observations, patient history, and epidemiological information. The expected result is Negative.  Fact Sheet for Patients:  BloggerCourse.com  Fact Sheet for Healthcare Providers:  SeriousBroker.it  This test is no t yet approved or cleared by the [place] FDA and  has been authorized for detection and/or diagnosis of SARS-CoV-2 by FDA under an Emergency Use Authorization (EUA). This EUA will remain  in effect (meaning this test can be used) for the duration of the COVID-19 declaration under Section 564(b)(1) of the Act, 21 U.S.C.section 360bbb-3(b)(1), unless the authorization is terminated  or revoked sooner.       Influenza A by PCR NEGATIVE NEGATIVE Final   Influenza B by PCR NEGATIVE NEGATIVE Final    Comment: (NOTE) The Xpert Xpress SARS-CoV-2/FLU/RSV plus assay is intended as an aid in the diagnosis of influenza from Nasopharyngeal swab specimens and should not be used as a sole basis for treatment. Nasal washings and aspirates are unacceptable for Xpert Xpress SARS-CoV-2/FLU/RSV testing.  Fact Sheet for Patients: BloggerCourse.com  Fact Sheet for Healthcare  Providers: SeriousBroker.it  This test is not yet approved or cleared by the [place] FDA and has been authorized for detection and/or diagnosis of SARS-CoV-2 by FDA under an Emergency Use Authorization (EUA). This EUA will remain in effect (meaning this test can be used) for the duration of the COVID-19 declaration under Section 564(b)(1) of the Act, 21 U.S.C. section 360bbb-3(b)(1), unless the authorization is terminated or revoked.  Performed at [facility], [address], [place], [place] [address]      Labs: BNP (last 3 results) Recent Labs    09/08/20 2223  BNP 104.2*   Basic Metabolic Panel: Recent Labs  Lab 09/08/20 1704 09/08/20 2223 09/09/20 0344 09/10/20 1511 09/10/20 1516 09/10/20 1520  NA 135  --  136 138  139 139 138  K 4.5  --  3.8 4.1  4.1 4.1 4.1  CL 103  --  103  --   --   --   CO2  24  --  25  --   --   --   GLUCOSE 149*  --  132*  --   --   --   BUN 23  --  25*  --   --   --   CREATININE 0.98  --  0.86  --   --   --   CALCIUM 9.3  --  9.3  --   --   --   MG  --  2.0  --   --   --   --    Liver Function Tests: Recent Labs  Lab 09/08/20 1704  AST 15  ALT 15  ALKPHOS 63  BILITOT 0.9  PROT 7.2  ALBUMIN 3.9   No results for input(s): LIPASE, AMYLASE in the last 168 hours. No results for input(s): AMMONIA in the last 168 hours. CBC: Recent Labs  Lab 09/08/20 1704 09/09/20 0344 09/10/20 1511 09/10/20 1516 09/10/20 1520  WBC 8.2 7.7  --   --   --   NEUTROABS 6.3  --   --   --   --   HGB 16.3 15.9 15.6  15.6 15.3 16.0  HCT 48.2 47.9 46.0  46.0 45.0 47.0  MCV 86.1 86.3  --   --   --   PLT 167 176  --   --   --    Cardiac Enzymes: No results for input(s): CKTOTAL, CKMB, CKMBINDEX, TROPONINI in the last 168 hours. BNP: Invalid input(s): POCBNP CBG: Recent Labs  Lab 09/10/20 1214 09/10/20 1609 09/10/20 2125 09/11/20 0642 09/11/20 1147  GLUCAP 128* 124* 136* 127* 117*   D-Dimer No  results for input(s): DDIMER in the last 72 hours. Hgb A1c Recent Labs    09/09/20 0344  HGBA1C 6.6*   Lipid Profile No results for input(s): CHOL, HDL, LDLCALC, TRIG, CHOLHDL, LDLDIRECT in the last 72 hours. Thyroid function studies Recent Labs    09/08/20 2258  TSH 1.420   Anemia work up No results for input(s): VITAMINB12, FOLATE, FERRITIN, TIBC, IRON, RETICCTPCT in the last 72 hours. Urinalysis    Component Value Date/Time   COLORURINE YELLOW 09/08/2020 2344   APPEARANCEUR CLEAR 09/08/2020 2344   LABSPEC 1.020 09/08/2020 2344   PHURINE 6.0 09/08/2020 2344   GLUCOSEU NEGATIVE 09/08/2020 2344   HGBUR NEGATIVE 09/08/2020 2344   BILIRUBINUR NEGATIVE 09/08/2020 2344   KETONESUR NEGATIVE 09/08/2020 2344   PROTEINUR NEGATIVE 09/08/2020 2344   UROBILINOGEN 4.0 (H) 07/10/2013 0310   NITRITE NEGATIVE 09/08/2020 2344   LEUKOCYTESUR NEGATIVE 09/08/2020 2344   Sepsis Labs Invalid input(s): PROCALCITONIN,  WBC,  LACTICIDVEN Microbiology Recent Results (from the past 240 hour(s))  Resp Panel by RT-PCR (Flu A&B, Covid) Nasopharyngeal Swab     Status: None   Collection Time: 09/08/20 10:17 PM   Specimen: Nasopharyngeal Swab; Nasopharyngeal(NP) swabs in vial transport medium  Result Value Ref Range Status   SARS Coronavirus 2 by RT PCR NEGATIVE NEGATIVE Final    Comment: (NOTE) SARS-CoV-2 target nucleic acids are NOT DETECTED.  The SARS-CoV-2 RNA is generally detectable in upper respiratory specimens during the acute phase of infection. The lowest concentration of SARS-CoV-2 viral copies this assay can detect is 138 copies/mL. A negative result does not preclude SARS-Cov-2 infection and should not be used as the sole basis for treatment or other patient management decisions. A negative result may occur with  improper specimen collection/handling, submission of specimen other than nasopharyngeal swab, presence of viral mutation(s) within the areas targeted by this  assay, and  inadequate number of viral copies(<138 copies/mL). A negative result must be combined with clinical observations, patient history, and epidemiological information. The expected result is Negative.  Fact Sheet for Patients:  BloggerCourse.comhttps://www.fda.gov/media/152166/download  Fact Sheet for Healthcare Providers:  SeriousBroker.ithttps://www.fda.gov/media/152162/download  This test is no t yet approved or cleared by the [place] FDA and  has been authorized for detection and/or diagnosis of SARS-CoV-2 by FDA under an Emergency Use Authorization (EUA). This EUA will remain  in effect (meaning this test can be used) for the duration of the COVID-19 declaration under Section 564(b)(1) of the Act, 21 U.S.C.section 360bbb-3(b)(1), unless the authorization is terminated  or revoked sooner.       Influenza A by PCR NEGATIVE NEGATIVE Final   Influenza B by PCR NEGATIVE NEGATIVE Final    Comment: (NOTE) The Xpert Xpress SARS-CoV-2/FLU/RSV plus assay is intended as an aid in the diagnosis of influenza from Nasopharyngeal swab specimens and should not be used as a sole basis for treatment. Nasal washings and aspirates are unacceptable for Xpert Xpress SARS-CoV-2/FLU/RSV testing.  Fact Sheet for Patients: BloggerCourse.comhttps://www.fda.gov/media/152166/download  Fact Sheet for Healthcare Providers: SeriousBroker.ithttps://www.fda.gov/media/152162/download  This test is not yet approved or cleared by the [place] FDA and has been authorized for detection and/or diagnosis of SARS-CoV-2 by FDA under an Emergency Use Authorization (EUA). This EUA will remain in effect (meaning this test can be used) for the duration of the COVID-19 declaration under Section 564(b)(1) of the Act, 21 U.S.C. section 360bbb-3(b)(1), unless the authorization is terminated or revoked.  Performed at [facility], [address] [place], [place] [address]      Time coordinating discharge:  35 minutes  SIGNED:   Dorcas CarrowKuber Reva Pinkley, MD  [facility] 09/11/2020, 12:46 PM

## 2020-09-13 ENCOUNTER — Encounter (HOSPITAL_COMMUNITY): Payer: Self-pay | Admitting: Family Medicine

## 2020-09-16 ENCOUNTER — Telehealth (HOSPITAL_COMMUNITY): Payer: Self-pay

## 2020-09-16 NOTE — Telephone Encounter (Signed)
Heart Failure Nurse Navigator Progress Note  Returned call to patient requesting call back with questions regarding HV TOC appt tomorrow @ 9am. Questions answered, pt confirmed appt.   [name], RN, [name] Heart Failure Nurse Navigator [phone]

## 2020-09-17 ENCOUNTER — Ambulatory Visit (HOSPITAL_COMMUNITY)
Admission: RE | Admit: 2020-09-17 | Discharge: 2020-09-17 | Disposition: A | Discharge status: Home / Self Care | Payer: PPO | Source: Ambulatory Visit | Attending: Internal Medicine | Admitting: Internal Medicine

## 2020-09-17 ENCOUNTER — Encounter (HOSPITAL_COMMUNITY): Payer: Self-pay

## 2020-09-17 ENCOUNTER — Other Ambulatory Visit (HOSPITAL_COMMUNITY): Payer: Self-pay

## 2020-09-17 ENCOUNTER — Other Ambulatory Visit: Payer: Self-pay

## 2020-09-17 ENCOUNTER — Ambulatory Visit (HOSPITAL_COMMUNITY)
Admission: RE | Admit: 2020-09-17 | Discharge: 2020-09-17 | Disposition: A | Discharge status: Home / Self Care | Payer: PPO | Source: Ambulatory Visit

## 2020-09-17 VITALS — BP 144/82 | HR 85 | Wt >= 6400 oz

## 2020-09-17 DIAGNOSIS — I4510 Unspecified right bundle-branch block: Secondary | ICD-10-CM | POA: Diagnosis not present

## 2020-09-17 DIAGNOSIS — I420 Dilated cardiomyopathy: Secondary | ICD-10-CM | POA: Insufficient documentation

## 2020-09-17 DIAGNOSIS — I5022 Chronic systolic (congestive) heart failure: Secondary | ICD-10-CM | POA: Diagnosis not present

## 2020-09-17 DIAGNOSIS — I110 Hypertensive heart disease with heart failure: Secondary | ICD-10-CM | POA: Insufficient documentation

## 2020-09-17 DIAGNOSIS — E1165 Type 2 diabetes mellitus with hyperglycemia: Secondary | ICD-10-CM | POA: Diagnosis not present

## 2020-09-17 DIAGNOSIS — I493 Ventricular premature depolarization: Secondary | ICD-10-CM

## 2020-09-17 DIAGNOSIS — Z7982 Long term (current) use of aspirin: Secondary | ICD-10-CM | POA: Diagnosis not present

## 2020-09-17 DIAGNOSIS — E1151 Type 2 diabetes mellitus with diabetic peripheral angiopathy without gangrene: Secondary | ICD-10-CM | POA: Insufficient documentation

## 2020-09-17 DIAGNOSIS — Z87891 Personal history of nicotine dependence: Secondary | ICD-10-CM | POA: Diagnosis not present

## 2020-09-17 DIAGNOSIS — I2722 Pulmonary hypertension due to left heart disease: Secondary | ICD-10-CM | POA: Insufficient documentation

## 2020-09-17 DIAGNOSIS — Z8249 Family history of ischemic heart disease and other diseases of the circulatory system: Secondary | ICD-10-CM | POA: Insufficient documentation

## 2020-09-17 DIAGNOSIS — Z7984 Long term (current) use of oral hypoglycemic drugs: Secondary | ICD-10-CM | POA: Insufficient documentation

## 2020-09-17 DIAGNOSIS — I5042 Chronic combined systolic (congestive) and diastolic (congestive) heart failure: Secondary | ICD-10-CM | POA: Diagnosis not present

## 2020-09-17 DIAGNOSIS — Z6841 Body Mass Index (BMI) 40.0 and over, adult: Secondary | ICD-10-CM | POA: Insufficient documentation

## 2020-09-17 DIAGNOSIS — Z79899 Other long term (current) drug therapy: Secondary | ICD-10-CM | POA: Insufficient documentation

## 2020-09-17 DIAGNOSIS — E785 Hyperlipidemia, unspecified: Secondary | ICD-10-CM | POA: Insufficient documentation

## 2020-09-17 DIAGNOSIS — Z794 Long term (current) use of insulin: Secondary | ICD-10-CM | POA: Insufficient documentation

## 2020-09-17 MED ORDER — SPIRONOLACTONE 25 MG PO TABS: 12.5000 mg | ORAL_TABLET | Freq: Every day | ORAL | 3 refills | Status: DC

## 2020-09-17 NOTE — Progress Notes (Signed)
[facility] Transitions of Care [facility]  ZOX:WRUEAV, Ronald Primary Cardiologist: [name]  HPI:  [name] is a 65 y.o.  male  with a PMH significant for Morbid obesity, T2DM, HTN, chronic venous insufficiency of lower extremities, peripheral vascular disease, recently diagnosed systolic CHF.    No real prior cardiac history until 09/08/20 where he was admitted for presyncope with negative orthostatic vital signs.  He was found to have new low EF CHF but never required diuresis, cardiology was consulted and performed L/RHC with results below:  Hemodynamic data: RA 12/11, mean 8 mmHg.  RA saturation 77%.  RV 37/5, EDP 10 mmHg.  PA 35/22, mean 28 mmHg.  PA saturation 81%.  PW 18/18, mean 16 mmHg.  Aortic saturation 98%. QT/QS 1.24 suggest insignificant left-to-right shunting.  May be at the right atrial level. Cardiac output 8.69, cardiac index 2.93 by Fick. LV 145/13, EDP 21 mmHg.  Aorta 112/25 with a mean of 91 mmHg. No pressure gradient across the aortic valve.  Angiographic data: LV: Dilated.  Global hypokinesis.  EF 30 to 35%.  EDP mildly elevated. Right dominant circulation.  No significant coronary artery disease with D1 being equivalent to ramus intermediate, large vessel.  All vessels are mild luminal irregularity.  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  He also has left to right shunt however may be clinically insignificant. Shunt potentially could be at the level of right atrium.  Blood pressure was doing well with his pcp over the years was well controlled over the last 7-8 years.  Quit smoking November of last year.  Not weighing himself at home. 192.1kg on discharge and 197.5 today. Taking lasix on Sunday and Wednesday.  A little more short of breath today.  Still having occasional dizziness with standing with concurrent leg  weakness but seems to have improved.       ROS: All systems negative except as listed in HPI, PMH and Problem List.  SH:  Social History   Socioeconomic History  . Marital status: Divorced    Spouse name: Not on file  . Number of children: Not on file  . Years of education: Not on file  . Highest education level: Not on file  Occupational History  . Occupation: disability  Tobacco Use  . Smoking status: Former Smoker    Packs/day: 1.50    Years: 42.00    Pack years: 63.00    Types: Cigarettes    Quit date: 05/03/2020    Years since quitting: 0.3  . Smokeless tobacco: Never Used  . Tobacco comment: congratulated and encouraged continued cessation.  Vaping Use  . Vaping Use: Never used  Substance and Sexual Activity  . Alcohol use: No  . Drug use: No  . Sexual activity: Yes  Other Topics Concern  . Not on file  Social History Narrative  . Not on file   Social Determinants of Health   Financial Resource Strain: Low Risk   . Difficulty of Paying Living Expenses: Not very hard  Food Insecurity: No Food Insecurity  . Worried About Programme researcher, broadcasting/film/video in the Last Year: Never true  . Ran Out of Food in the Last Year: Never true  Transportation Needs: No Transportation Needs  . Lack of Transportation (Medical): No  . Lack of Transportation (Non-Medical): No  Physical Activity: Inactive  . Days of Exercise per Week: 0 days  .  Minutes of Exercise per Session: 0 min  Stress: Not on file  Social Connections: Not on file  Intimate Partner Violence: Not on file    FH:  Family History  Problem Relation Age of Onset  . Heart disease Mother   . Heart attack Father     Past Medical History:  Diagnosis Date  . Arthritis   . Diabetes mellitus without complication (HCC)   . Hypertension   . Lymphedema 09/09/2020  . Wound drainage    right leg 3/4 inch area open wound with clear drainage last 2 days, covering with gauze prn    Current Outpatient Medications   Medication Sig Dispense Refill  . aspirin EC 81 MG tablet Take 81 mg by mouth daily.    Marland Kitchen atorvastatin (LIPITOR) 10 MG tablet Take 10 mg by mouth at bedtime.    . carvedilol (COREG) 6.25 MG tablet Take 1 tablet (6.25 mg total) by mouth 2 (two) times daily with a meal. 180 tablet 1  . furosemide (LASIX) 20 MG tablet Take 20 mg by mouth 2 (two) times a week. Wednesday and Sunday    . insulin glargine (LANTUS) 100 UNIT/ML injection Inject 30 Units into the skin daily.    . metFORMIN (GLUCOPHAGE) 500 MG tablet Take 1,000 mg by mouth 2 (two) times daily with a meal.    . Multiple Vitamin (MULTIVITAMIN WITH MINERALS) TABS tablet Take 1 tablet by mouth every evening.     . ramipril (ALTACE) 2.5 MG capsule Take 2.5 mg by mouth daily.     No current facility-administered medications for this encounter.    Vitals:   09/17/20 0908  BP: (!) 144/82  Pulse: 85  SpO2: 97%  Weight: (!) 197.5 kg (435 lb 6.4 oz)    PHYSICAL EXAM: Cardiac: JVD difficult to assess, normal rate irregular rhythm, clear s1 and s2, no murmurs, rubs or gallops, chronic venous stasis of bilateral lower extremities Pulmonary: distant breath sounds, not in distress Abdominal: non distended abdomen, soft and nontender Psych: Alert, conversant, in good spirits  ECG   NSR rate 76, pvc's, RBBB  ASSESSMENT & PLAN: Chronic systolic and diastolic CHF, NICM: -ECHO normal in 12/2018 EF 50-55%  frequent pvc's noted during study, 08/2020 EF now 30%, no valve issues -3/022 L/RHC with normal coronaries, mild L->R shunt, mild pulmonary hypertension -NYHA Class III symptoms (although severely obese and deconditioned), weight up and edema worse per patient -He agreed to take his lasix today and tomorrow but doesn't want to take it more than twice weekly.  Says it interferes with his daily activities and would like to continue taking it twice weekly.   -continue carvedilol 6.25mg , ramipril 2.5mg (will likely change next visit) -add spiro  12.5mg  first to help with volume -will avoid SGLT2i for now with hx of peripheral vascular disease, scrotal cellulitis, poor hygiene -send for sleep study -zio monitor to assess PVC burden  High PVC burden: -may be contributing to symptoms of occasional lightheadedness  -zio monitor to assess  Morbid Obesity: -doesn't want referral to healthy weight clinic, wants to eat salads cut out carbs and increase activity levels  T2DM: -on metformin and lantus currently  -recommend glp-1 RA to help with weight loss hopefully able to decrease insulin -avoid SGLT2i with hx of peripheral vascular disease, scrotal cellulitis, poor hygiene   Follow up with me in one week

## 2020-09-17 NOTE — Progress Notes (Signed)
Patient Name: [name]        DOB: December 14, 1955      Height: 6'1    Weight:435.4lbs  Office Name: [facility]         Referring Provider: Kimberlee Nearing. Winfrey  Today's Date:09/17/20  Date:  09/17/20 STOP BANG RISK ASSESSMENT S (snore) Have you been told that you snore?     YES   T (tired) Are you often tired, fatigued, or sleepy during the day?   YES  O (obstruction) Do you stop breathing, choke, or gasp during sleep? NO   P (pressure) Do you have or are you being treated for high blood pressure? YES   B (BMI) Is your body index greater than 35 kg/m? YES   A (age) Are you 15 years old or older? YES   N (neck) Do you have a neck circumference greater than 16 inches?   YES/NO   G (gender) Are you a male? YES   TOTAL STOP/BANG "YES" ANSWERS 6                                                                       For Office Use Only              Procedure Order Form    YES to 3+ Stop Bang questions OR two clinical symptoms - patient qualifies for WatchPAT (CPT 95800)             Clinical Notes: Will consult Sleep Specialist and refer for management of therapy due to patient increased risk of Sleep Apnea. Ordering a sleep study due to the following two clinical symptoms: Excessive daytime sleepiness G47.10 /  Loud snoring R06.83 / History of high blood pressure R03.0   I understand that I am proceeding with a home sleep apnea test as ordered by my treating physician. I understand that untreated sleep apnea is a serious cardiovascular risk factor and it is my responsibility to perform the test and seek management for sleep apnea. I will be contacted with the results and be managed for sleep apnea by a local sleep physician. I will be receiving equipment and further instructions from [facility]. I shall promptly ship back the equipment via the included mailing label. I understand my insurance will be billed for the test and as the patient I am responsible for any  insurance related out-of-pocket costs incurred. I have been provided with written instructions and can call for additional video or telephonic instruction, with 24-hour availability of qualified personnel to answer any questions: Patient Help Desk [phone].  Patient Signature ______________________________________________________   Date______________________ Patient Telemedicine Verbal Consent

## 2020-09-17 NOTE — Patient Instructions (Addendum)
No Labs done today.   START Spironolactone 12.5mg  (1/2 tablet) by mouth daily.   No other medication changes were made. Please continue all current medications as prescribed.  Your provider has recommended that you have a home sleep study.  We have provided you with the equipment in our office today. Please download the app and follow the instructions. YOUR PIN NUMBER IS: [id number]. Once you have completed the test you just dispose of the equipment, the information is automatically uploaded to [place] via blue-tooth technology. If your test is positive for sleep apnea and you need a home CPAP machine you will be contacted by Dr Norris Cross office [facility]) to set this up.  Your provider has recommended that  you wear a Zio Patch for 7 days.  This monitor will record your heart rhythm for our review.  IF you have any symptoms while wearing the monitor please press the button.  If you have any issues with the patch or you notice a red or orange light on it please call the company at [phone].  Once you remove the patch please mail it back to the company as soon as possible so we can get the results.  Your physician recommends that you schedule a follow-up appointment in: 1 week   If you have any questions or concerns before your next appointment please send [place] a message through [place] or call our office at [phone].    TO LEAVE A MESSAGE FOR THE NURSE SELECT OPTION 2, PLEASE LEAVE A MESSAGE INCLUDING: . YOUR NAME . DATE OF BIRTH . CALL BACK NUMBER . REASON FOR CALL**this is important as we prioritize the call backs  YOU WILL RECEIVE A CALL BACK THE SAME DAY AS LONG AS YOU CALL BEFORE 4:00 PM

## 2020-09-17 NOTE — Progress Notes (Signed)
Patient was given a home sleep study device and was instructed to complete home sleep study after notified regarding prior insurance approval. He acknowledges understanding and will await call in order to complete study.

## 2020-09-17 NOTE — Progress Notes (Signed)
[facility] Transitions of Care Clinic Heart Failure Pharmacist Encounter  HPI:  65 yo M with PMH of HTN, HLD, T2DM, chronic lymphedema, morbid obesity, and prior tobacco use. He presented to the ED on 09/08/20 with weakness, shortness of breath, and lightheadedness. An ECHO was done on 09/09/20 and LVEF was 30% (down from 50-55% in July 2020). A R/LHC was done on 09/10/20 and found to have NICM with mild pulmonary hypertension. He was then discharged on 09/11/20.  Today, [name] presents to the [facility] for follow up. He denies having shortness of breath, DOE, orthopnea/PND. He has chronic lymphedema in both LE. He does not have a scale at home to check daily weights. He has a BP cuff at home but he states this is currently packed away at the moment because he is in the middle of transitioning his housing (temporarily). He has been taking all medications as prescribed.   HF Medications: Furosemide 20 mg twice weekly (Wed and Sun) Carvedilol 6.25 mg BID Ramipril 2.5 mg daily  Has the patient been experiencing any side effects to the medications prescribed?  no  Does the patient have any problems obtaining medications due to transportation or finances?   no  Understanding of regimen: good Understanding of indications: good Potential of compliance: good Patient understands to avoid NSAIDs. Patient understands to avoid decongestants.   Pertinent Lab Values: . Serum creatinine 0.86, BUN 25, Potassium 3.8, Sodium 136, BNP 104.2, Magnesium 2.0  Vital Signs: . Weight: 435 lbs (discharge weight: 423 lbs) . Blood pressure: 144/82 mmHg  . Heart rate: 85 bpm   Medication Assistance / Insurance Benefits Check: Does the patient have prescription insurance?  Yes Type of insurance plan: [employer] Medicare  Does the patient qualify for medication assistance through manufacturers or grants?   Yes . Eligible grants and/or patient assistance programs:  pending . Medication assistance applications in progress: none  . Medication assistance applications approved: none Approved medication assistance renewals will be completed by: pending  Outpatient Pharmacy:  Current outpatient pharmacy: [facility] Was the [facility] pharmacy used to supply discharge medications? no  Is the patient willing to transition their outpatient pharmacy to utilize a [facility] outpatient pharmacy with or without mail order? No - prefers [facility] mail order for all chronic meds  Assessment: 1) Chronic systolic CHF (EF 10%), due to NICM. NYHA class II symptoms. - Instructed to take a dose of furosemide 20 mg today, then continue furosemide 20 mg twice weekly (Wed and Sun) - Continue carvedilol 6.25 mg BID - Continue ramipril 2.5 mg daily - would like to optimize to Entresto at next visit - Start spironolactone 12.5 mg daily today. BMET in 1 week. - No SGLT2i with history of scrotal cellulitis requiring hospitalization in 2015  Plan: 1) Medication changes: - Furosemide 20 mg x 1 today, then continue 20 mg twice weekly - Start spironolactone 12.5 mg daily - At next visit, will switch ramipril to Entresto if tolerating addition of spironolactone and BP room to optimize HF regimen further  2) Patient Assistance: - Instructed him to purchase scale  3) Follow up: - Next appointment with HF [facility] clinic on 09/24/20  Sharen Hones, PharmD, BCPS Heart Failure Transitions of Care Clinic Pharmacist [phone]

## 2020-09-17 NOTE — Addendum Note (Signed)
Encounter addended by: Crissie Figures, RN on: 09/17/2020 3:54 PM  Actions taken: Clinical Note Signed

## 2020-09-18 ENCOUNTER — Telehealth (HOSPITAL_COMMUNITY): Payer: Self-pay | Admitting: Surgery

## 2020-09-18 NOTE — Telephone Encounter (Signed)
I called to inform [name] that no insurance prior authorization is required and that he can proceed with his home sleep study as ordered.  He understands and verbalizes that he has no questions or concerns at this time.  He plans to complete the study tonite or tomorrow night.

## 2020-09-23 ENCOUNTER — Other Ambulatory Visit (HOSPITAL_COMMUNITY): Payer: Self-pay | Admitting: Internal Medicine

## 2020-09-23 ENCOUNTER — Other Ambulatory Visit (HOSPITAL_COMMUNITY): Payer: Self-pay | Admitting: Surgery

## 2020-09-23 DIAGNOSIS — R55 Syncope and collapse: Secondary | ICD-10-CM

## 2020-09-23 DIAGNOSIS — I5022 Chronic systolic (congestive) heart failure: Secondary | ICD-10-CM

## 2020-09-23 DIAGNOSIS — I493 Ventricular premature depolarization: Secondary | ICD-10-CM

## 2020-09-24 ENCOUNTER — Other Ambulatory Visit: Payer: Self-pay

## 2020-09-24 ENCOUNTER — Other Ambulatory Visit (HOSPITAL_COMMUNITY): Payer: Self-pay

## 2020-09-24 ENCOUNTER — Ambulatory Visit: Payer: Self-pay | Admitting: Student

## 2020-09-24 ENCOUNTER — Encounter (HOSPITAL_COMMUNITY): Payer: Self-pay

## 2020-09-24 ENCOUNTER — Ambulatory Visit (HOSPITAL_COMMUNITY)
Admission: RE | Admit: 2020-09-24 | Discharge: 2020-09-24 | Disposition: A | Discharge status: Home / Self Care | Payer: PPO | Source: Ambulatory Visit | Attending: Internal Medicine | Admitting: Internal Medicine

## 2020-09-24 VITALS — BP 144/88 | HR 84 | Ht 72.0 in | Wt >= 6400 oz

## 2020-09-24 DIAGNOSIS — I5022 Chronic systolic (congestive) heart failure: Secondary | ICD-10-CM | POA: Diagnosis not present

## 2020-09-24 DIAGNOSIS — R42 Dizziness and giddiness: Secondary | ICD-10-CM | POA: Insufficient documentation

## 2020-09-24 DIAGNOSIS — I890 Lymphedema, not elsewhere classified: Secondary | ICD-10-CM | POA: Insufficient documentation

## 2020-09-24 DIAGNOSIS — I420 Dilated cardiomyopathy: Secondary | ICD-10-CM | POA: Insufficient documentation

## 2020-09-24 DIAGNOSIS — I2722 Pulmonary hypertension due to left heart disease: Secondary | ICD-10-CM | POA: Insufficient documentation

## 2020-09-24 DIAGNOSIS — I10 Essential (primary) hypertension: Secondary | ICD-10-CM

## 2020-09-24 DIAGNOSIS — Z8249 Family history of ischemic heart disease and other diseases of the circulatory system: Secondary | ICD-10-CM | POA: Diagnosis not present

## 2020-09-24 DIAGNOSIS — Z7984 Long term (current) use of oral hypoglycemic drugs: Secondary | ICD-10-CM | POA: Insufficient documentation

## 2020-09-24 DIAGNOSIS — I110 Hypertensive heart disease with heart failure: Secondary | ICD-10-CM | POA: Insufficient documentation

## 2020-09-24 DIAGNOSIS — E1165 Type 2 diabetes mellitus with hyperglycemia: Secondary | ICD-10-CM | POA: Diagnosis not present

## 2020-09-24 DIAGNOSIS — I5042 Chronic combined systolic (congestive) and diastolic (congestive) heart failure: Secondary | ICD-10-CM | POA: Insufficient documentation

## 2020-09-24 DIAGNOSIS — Z79899 Other long term (current) drug therapy: Secondary | ICD-10-CM | POA: Insufficient documentation

## 2020-09-24 DIAGNOSIS — I872 Venous insufficiency (chronic) (peripheral): Secondary | ICD-10-CM | POA: Diagnosis not present

## 2020-09-24 DIAGNOSIS — E785 Hyperlipidemia, unspecified: Secondary | ICD-10-CM | POA: Diagnosis not present

## 2020-09-24 DIAGNOSIS — E1151 Type 2 diabetes mellitus with diabetic peripheral angiopathy without gangrene: Secondary | ICD-10-CM | POA: Insufficient documentation

## 2020-09-24 DIAGNOSIS — Z7901 Long term (current) use of anticoagulants: Secondary | ICD-10-CM | POA: Insufficient documentation

## 2020-09-24 DIAGNOSIS — Z7982 Long term (current) use of aspirin: Secondary | ICD-10-CM | POA: Insufficient documentation

## 2020-09-24 DIAGNOSIS — Z794 Long term (current) use of insulin: Secondary | ICD-10-CM | POA: Diagnosis not present

## 2020-09-24 DIAGNOSIS — Z87891 Personal history of nicotine dependence: Secondary | ICD-10-CM | POA: Diagnosis not present

## 2020-09-24 LAB — BASIC METABOLIC PANEL
Anion gap: 7 (ref 5–15)
BUN: 23 mg/dL (ref 8–23)
CO2: 24 mmol/L (ref 22–32)
Calcium: 8.8 mg/dL — ABNORMAL LOW (ref 8.9–10.3)
Chloride: 107 mmol/L (ref 98–111)
Creatinine, Ser: 0.83 mg/dL (ref 0.61–1.24)
GFR, Estimated: >60 mL/min (ref >60)
Glucose, Bld: 137 mg/dL — ABNORMAL HIGH (ref 70–99)
Potassium: 4.2 mmol/L (ref 3.5–5.1)
Sodium: 138 mmol/L (ref 135–145)

## 2020-09-24 MED ORDER — ENTRESTO 24-26 MG PO TABS: 1.0000 | ORAL_TABLET | Freq: Two times a day (BID) | ORAL | 3 refills | Status: DC

## 2020-09-24 NOTE — Progress Notes (Signed)
[facility] Transitions of Care Clinic Heart Failure Pharmacist Encounter  HPI:  65 yo M with PMH of HTN, HLD, T2DM, chronic lymphedema, morbid obesity, and prior tobacco use. He presented to the ED on 09/08/20 with weakness, shortness of breath, and lightheadedness. An ECHO was done on 09/09/20 and LVEF was 30% (down from 50-55% in July 2020). A R/LHC was done on 09/10/20 and found to have NICM with mild pulmonary hypertension. He was then discharged on 09/11/20.  He presented for follow up in the HF [facility] clinic on 09/17/20 and was doing well. He denied shortness of breath, DOE, orthopnea/PND. He has chronic lymphedema in both LE. BP was 144/82 during that visit and spironolactone 12.5 mg daily was added.   Today, [name] presents to the Heart Failure TOC Clinic for follow up. He denies having shortness of breath, DOE, orthopnea/PND, lightheadedness or dizziness. He has not received his scale in the mail yet - he states this should arrive on Sunday. His BP cuff is still packed away during his temporary move. He has not been following a low-sodium/fluid restricted diet. He states he currently drinks 112 oz of fluid per day (coffee and sparkling water). Encouraged him to drink no more than 64 oz of fluid in a day. He has been taking all medications as prescribed and he has a phone alarm to remind him to take medications to improve compliance.   HF Medications: Furosemide 20 mg twice weekly (Wed and Sun) Carvedilol 6.25 mg BID Ramipril 2.5 mg daily Spironolactone 12.5 mg daily  Has the patient been experiencing any side effects to the medications prescribed?  no  Does the patient have any problems obtaining medications due to transportation or finances?   no  Understanding of regimen: good Understanding of indications: good Potential of compliance: good Patient understands to avoid NSAIDs. Patient understands to avoid decongestants.   Pertinent Lab Values: . Serum creatinine  0.83, BUN 23, Potassium 4.2, Sodium 138  Vital Signs: . Weight: 435 lbs (last clinic weight: 435 lbs) . Blood pressure: 144/88 mmHg  . Heart rate: 84 bpm   Medication Assistance / Insurance Benefits Check: Does the patient have prescription insurance?  Yes Type of insurance plan: [employer] Medicare  Outpatient Pharmacy:  Current outpatient pharmacy: [facility] Was the [facility] pharmacy used to supply discharge medications? no  Is the patient willing to transition their outpatient pharmacy to utilize a [facility] outpatient pharmacy with or without mail order? No - prefers [facility] for all chronic meds.  Assessment: 1) Chronicsystolic CHF (EF 56%), due to NICM. NYHA class IIsymptoms. - Continue furosemide 20 mg twice weekly (Wed and Sun) - Continue carvedilol 6.25 mg BID - Stop ramipril 2.5 mg daily (last dose was 4/11 PM) - Start Entresto 24/26 mg BID (first dose to begin on 4/13 AM) - Start spironolactone 12.5 mg daily today. BMET in 1 week. - No SGLT2i with history of scrotal cellulitis requiring hospitalization in 2015  Plan: 1) Medication changes: - Stop ramipril 2.5 mg daily - Start Entresto 24/26 mg BID  2) Patient Assistance: - Prior authorization for [name] is required - submitted [place] on CoverMyMeds (Key [id number]). Status is approved.  - Copay $45 per month - pt states he cannot afford this on top of his insulin cost. - Patient assistance application to [employer] has been initiated - patient portion signed and completed, awaiting MD signature  3) Follow up: - Next appointment with HF  clinic on 10/28/20  Sharen Hones, PharmD, BCPS Heart Failure Transitions of Care Clinic Pharmacist [phone]

## 2020-09-24 NOTE — Progress Notes (Signed)
[facility]  FTD:DUKGUR, Ronald Primary Cardiologist: [name]  HPI:  [name] is a 65 y.o.  male  with a PMH significant for Morbid obesity, T2DM, HTN, chronic venous insufficiency of lower extremities, peripheral vascular disease, recently diagnosed systolic CHF.    No real prior cardiac history until 09/08/20 where he was admitted for presyncope with negative orthostatic vital signs.  He was found to have new low EF CHF but never required diuresis, cardiology was consulted and performed L/RHC with results below:  Hemodynamic data: RA 12/11, mean 8 mmHg.  RA saturation 77%.  RV 37/5, EDP 10 mmHg.  PA 35/22, mean 28 mmHg.  PA saturation 81%.  PW 18/18, mean 16 mmHg.  Aortic saturation 98%. QT/QS 1.24 suggest insignificant left-to-right shunting.  May be at the right atrial level. Cardiac output 8.69, cardiac index 2.93 by Fick. LV 145/13, EDP 21 mmHg.  Aorta 112/25 with a mean of 91 mmHg. No pressure gradient across the aortic valve.  Angiographic data: LV: Dilated.  Global hypokinesis.  EF 30 to 35%.  EDP mildly elevated. Right dominant circulation.  No significant coronary artery disease with D1 being equivalent to ramus intermediate, large vessel.  All vessels are mild luminal irregularity.  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  He also has left to right shunt however may be clinically insignificant. Shunt potentially could be at the level of right atrium.  Blood pressure was doing well with his pcp over the years was well controlled over the last 7-8 years.  Quit smoking November of last year.  Last visit some extra volume noted weight up a few kg from discharge 197.5. Only taking lasix on Sunday and Wednesday but agreed to take an extra dose Tuesday when I saw him.   Overall feeling well other than his  sleep schedule is out of order.  Breathing well, dizzy spells have decreased in frequency dramatically only one episode when getting abruptly.   Can walk from the apartment to the dumpster to the car today without getting short of breath today.  Tries to walk daily outside around the apartment building.      ROS: All systems negative except as listed in HPI, PMH and Problem List.  SH:  Social History   Socioeconomic History  . Marital status: Divorced    Spouse name: Not on file  . Number of children: Not on file  . Years of education: Not on file  . Highest education level: Not on file  Occupational History  . Occupation: disability  Tobacco Use  . Smoking status: Former Smoker    Packs/day: 1.50    Years: 42.00    Pack years: 63.00    Types: Cigarettes    Quit date: 05/03/2020    Years since quitting: 0.3  . Smokeless tobacco: Never Used  . Tobacco comment: congratulated and encouraged continued cessation.  Vaping Use  . Vaping Use: Never used  Substance and Sexual Activity  . Alcohol use: No  . Drug use: No  . Sexual activity: Yes  Other Topics Concern  . Not on file  Social History Narrative  . Not on file   Social Determinants of Health   Financial Resource Strain: Low Risk   . Difficulty of Paying Living Expenses: Not very hard  Food Insecurity: No Food Insecurity  . Worried About Programme researcher, broadcasting/film/video in  the Last Year: Never true  . Ran Out of Food in the Last Year: Never true  Transportation Needs: No Transportation Needs  . Lack of Transportation (Medical): No  . Lack of Transportation (Non-Medical): No  Physical Activity: Inactive  . Days of Exercise per Week: 0 days  . Minutes of Exercise per Session: 0 min  Stress: Not on file  Social Connections: Not on file  Intimate Partner Violence: Not on file    FH:  Family History  Problem Relation Age of Onset  . Heart disease Mother   . Heart attack Father     Past Medical History:  Diagnosis Date  .  Arthritis   . Diabetes mellitus without complication (HCC)   . Hypertension   . Lymphedema 09/09/2020  . Wound drainage    right leg 3/4 inch area open wound with clear drainage last 2 days, covering with gauze prn    Current Outpatient Medications  Medication Sig Dispense Refill  . aspirin EC 81 MG tablet Take 81 mg by mouth daily.    Marland Kitchen atorvastatin (LIPITOR) 10 MG tablet Take 10 mg by mouth at bedtime.    . carvedilol (COREG) 6.25 MG tablet Take 1 tablet (6.25 mg total) by mouth 2 (two) times daily with a meal. 180 tablet 1  . furosemide (LASIX) 20 MG tablet Take 20 mg by mouth 2 (two) times a week. Wednesday and Sunday    . insulin glargine (LANTUS) 100 UNIT/ML injection Inject 30 Units into the skin daily.    . metFORMIN (GLUCOPHAGE) 500 MG tablet Take 1,000 mg by mouth 2 (two) times daily with a meal.    . Multiple Vitamin (MULTIVITAMIN WITH MINERALS) TABS tablet Take 1 tablet by mouth every evening.     . ramipril (ALTACE) 2.5 MG capsule Take 2.5 mg by mouth daily.    Marland Kitchen spironolactone (ALDACTONE) 25 MG tablet Take 0.5 tablets (12.5 mg total) by mouth daily. 45 tablet 3   No current facility-administered medications for this encounter.    Vitals:   09/24/20 1013  BP: (!) 144/88  Pulse: 84  SpO2: 97%  Weight: (!) 197.4 kg (435 lb 3.2 oz)  Height: 6' (1.829 m)    PHYSICAL EXAM: Cardiac: JVD difficult to assess, normal rate irregular rhythm, clear s1 and s2, no murmurs, rubs or gallops, chronic venous stasis of bilateral lower extremities Pulmonary: distant breath sounds but clear,  not in distress Abdominal: obese, non distended abdomen, soft and nontender Psych: Alert, conversant, in good spirits   ASSESSMENT & PLAN: Chronic systolic and diastolic CHF, NICM: -ECHO normal in 12/2018 EF 50-55%  frequent pvc's noted during study, 08/2020 EF now 30%, no valve issues -3/022 L/RHC with normal coronaries, mild L->R shunt, mild pulmonary hypertension -NYHA Class III symptoms  (although severely obese and deconditioned), weight is stable today, surprisingly looks less overloaded despite weight being exactly the same, could be due to weight gain from poor diet in the setting of quitting smoking and moving -I will continue his twice weekly lasix   -continue carvedilol 6.25mg , spiro 12.5mg  -stop ramipril and start entresto 24/26 after 48hrs -will avoid SGLT2i  with hx of peripheral vascular disease, scrotal cellulitis -still has not done sleep study due to poor sleep pattern -zio monitor to assess PVC burden remains in place results in around one week  High PVC burden: -may be contributing to symptoms of occasional lightheadedness and cardiomyopathy  -zio monitor to assess PVC burden remains in place results in around one  week -on carvedilol 6.25mg   Morbid Obesity: -doesn't want referral to healthy weight clinic, would like to make changes on his own.  He has increased activity level but admits diet remains poor due to quitting smoking and he is in the process of moving.  T2DM: -on metformin and lantus currently  -recommend glp-1 RA to help with weight loss hopefully able to decrease insulin -avoid SGLT2i with hx of peripheral vascular disease, scrotal cellulitis, poor hygiene   Follow up with [facility]

## 2020-09-24 NOTE — Patient Instructions (Addendum)
Good to see you!  Stop Ramipril Start Entresto 24/26 mg twice daily ---in 48 hours 09/26/2020 Lab work today.  We will call with abnormals only.  Follow up in the [facility] Clinic with Advanced Practice Provider in one month. Complete ordered home sleep study

## 2020-09-25 ENCOUNTER — Telehealth (HOSPITAL_COMMUNITY): Payer: Self-pay | Admitting: Pharmacy Technician

## 2020-09-25 DIAGNOSIS — Z1211 Encounter for screening for malignant neoplasm of colon: Secondary | ICD-10-CM | POA: Diagnosis not present

## 2020-09-25 DIAGNOSIS — Z0000 Encounter for general adult medical examination without abnormal findings: Secondary | ICD-10-CM | POA: Diagnosis not present

## 2020-09-25 DIAGNOSIS — E7800 Pure hypercholesterolemia, unspecified: Secondary | ICD-10-CM | POA: Diagnosis not present

## 2020-09-25 DIAGNOSIS — I428 Other cardiomyopathies: Secondary | ICD-10-CM | POA: Diagnosis not present

## 2020-09-25 DIAGNOSIS — E1169 Type 2 diabetes mellitus with other specified complication: Secondary | ICD-10-CM | POA: Diagnosis not present

## 2020-09-25 DIAGNOSIS — I10 Essential (primary) hypertension: Secondary | ICD-10-CM | POA: Diagnosis not present

## 2020-09-25 DIAGNOSIS — I872 Venous insufficiency (chronic) (peripheral): Secondary | ICD-10-CM | POA: Diagnosis not present

## 2020-09-25 DIAGNOSIS — Z1389 Encounter for screening for other disorder: Secondary | ICD-10-CM | POA: Diagnosis not present

## 2020-09-25 NOTE — Telephone Encounter (Signed)
Sent in [employer] application via fax.  Will follow up.  

## 2020-10-01 ENCOUNTER — Other Ambulatory Visit: Payer: Self-pay

## 2020-10-01 ENCOUNTER — Encounter: Payer: Self-pay | Admitting: Student

## 2020-10-01 ENCOUNTER — Ambulatory Visit: Payer: PPO | Admitting: Student

## 2020-10-01 VITALS — BP 130/75 | HR 82 | Temp 97.4°F | Resp 16 | Ht 72.0 in | Wt >= 6400 oz

## 2020-10-01 DIAGNOSIS — I5022 Chronic systolic (congestive) heart failure: Secondary | ICD-10-CM

## 2020-10-01 DIAGNOSIS — I10 Essential (primary) hypertension: Secondary | ICD-10-CM

## 2020-10-01 DIAGNOSIS — I493 Ventricular premature depolarization: Secondary | ICD-10-CM | POA: Diagnosis not present

## 2020-10-01 DIAGNOSIS — E1165 Type 2 diabetes mellitus with hyperglycemia: Secondary | ICD-10-CM

## 2020-10-01 NOTE — Progress Notes (Signed)
Primary Physician/Referring:  [name], Ronald, MD  Patient ID: [name], male    DOB: 04/17/1956, 65 y.o.   MRN: [id number]  Chief Complaint  PatieRenford [name] presents with  . Congestive Heart Failure  . Cardiomyopathy  . Hypertension  . New Patient (Initial Visit)   HPI:    [name]  is a 65 y.o. Caucasian male with hypertension, hyperlipidemia, type 2 diabetes mellitus, chronic lymphedema of the bilateral lower extremity, morbid obesity, tobacco use disorder quit in November 2021, who lives by himself and is presently disabled.   Patient was admitted 09/08/2020 to the hospital with dizziness and generalized weakness in his legs.  Echocardiogram at the time revealed severely reduced LVEF in view of cardiovascular risk factors patient underwent left and right heart catheterization.  Heart catheterization revealed mildly elevated LVEDP and no significant coronary artery disease.  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  Patient was recently seen by Dr. Frances [name] in the heart and vascular transitions clinic on 09/17/2020.  At this time he was started on Lasix twice weekly, spironolactone 12.5 mg daily, referred for sleep study, and ZIO monitor placed to assess PVC burden.  At this time shared decision was to avoid SGLT2 inhibitors due to patient's history of peripheral vascular disease, scrotal cellulitis, and poor hygiene.  He was then transition[facility]. Frances [name] on 09/24/2020.  Patient now presents for follow-up.  Patient states he is feeling well overall since discharge, however he reports significant life stress at this time.  States his apartment building is being remodeled and he is currently in the process of moving into a hotel for the next 3-5 weeks.  He states he has been slowly increasing his physical activity, walking around his apartment building daily without  issue.  He reports compliance with low-sodium diet.  Presently taking Lasix 20 mg 4-5 days/week.  He has had no recurrence of dizziness since discharge from the hospital.  However he does report a single episode of dyspnea on exertion while cooking a large Holiday meal recently.  Patient is in the process of undergoing sleep apnea evaluation at this time as well.  Past Medical History:  Diagnosis Date  . Arthritis   . Diabetes mellitus without complication (HCC)   . Hypertension   . Lymphedema 09/09/2020  . Wound drainage    right leg 3/4 inch area open wound with clear drainage last 2 days, covering with gauze prn   Past Surgical History:  Procedure Laterality Date  . COLONOSCOPY  2014 ?  Marland Kitchen. FLEXIBLE SIGMOIDOSCOPY N/A 08/27/2014   Procedure: FLEXIBLE SIGMOIDOSCOPY;  Surgeon: Matthew BumpersMartin K Johnson, MD;  Location: [facility];  Service: Endoscopy;  Laterality: N/A;  . NO PAST SURGERIES    . RIGHT/LEFT HEART CATH AND CORONARY ANGIOGRAPHY N/A 09/10/2020   Procedure: RIGHT/LEFT HEART CATH AND CORONARY ANGIOGRAPHY;  Surgeon: [name], Jay, MD;  Location: [facility];  Service: Cardiovascular;  Laterality: N/A;   Family History  Problem Relation Age of Onset  . Heart disease Mother   . Heart attack Father     Social History   Tobacco Use  . Smoking status: Former Smoker    Packs/day: 1.50    Years: 42.00    Pack years: 63.00    Types: Cigarettes    Quit date: 05/03/2020    Years since quitting: 0.4  . Smokeless tobacco: Never Used  . Tobacco  comment: congratulated and encouraged continued cessation.  Substance Use Topics  . Alcohol use: No   Marital Status: Divorced   ROS  Review of Systems  Constitutional: Negative for malaise/fatigue and weight gain.  Cardiovascular: Positive for dyspnea on exertion. Negative for chest pain, claudication, leg swelling, near-syncope, orthopnea, palpitations, paroxysmal nocturnal dyspnea and syncope.  Hematologic/Lymphatic: Does not bruise/bleed  easily.  Gastrointestinal: Negative for melena.  Neurological: Negative for dizziness and weakness.    Objective  Blood pressure 130/75, pulse 82, temperature (!) 97.4 F (36.3 C), resp. rate 16, height 6' (1.829 m), weight (!) 435 lb (197.3 kg), SpO2 97 %.  Vitals with BMI 10/01/2020 09/24/2020 09/17/2020  Height 6\' 0"  6\' 0"  -  Weight 435 lbs 435 lbs 3 oz 435 lbs 6 oz  BMI 58.98 59.01 57.46  Systolic 130 144  Diastolic 75 88 82  Pulse 82 84 85     Physical Exam Vitals reviewed.  Constitutional:      Comments: Morbidly obese   HENT:     Head: Normocephalic and atraumatic.  Cardiovascular:     Rate and Rhythm: Normal rate and regular rhythm.     Pulses: Intact distal pulses.          Carotid pulses are 2+ on the right side and 2+ on the left side.      Dorsalis pedis pulses are 1+ on the right side and 2+ on the left side.       Posterior tibial pulses are 0 on the right side and 0 on the left side.     Heart sounds: S1 normal and S2 normal. No murmur heard. No gallop.      Comments: Femoral and popliteal pulse difficult to feel due to patient's body habitus.  Pulmonary:     Effort: Pulmonary effort is normal. No respiratory distress.     Breath sounds: No wheezing, rhonchi or rales.  Musculoskeletal:     Right lower leg: Edema (Non-pitting, chronic venostasis changes noted) present.     Left lower leg: Edema (Non-pitting, chronic venostasis changes noted) present.  Skin:    Comments: Right shin wound.   Neurological:     Mental Status: He is alert.    Laboratory examination:   Recent Labs    09/08/20 1704 09/09/20 0344 09/10/20 1511 09/10/20 1516 09/10/20 1520 09/24/20 1108  NA 135 136   < > 139 138 138  K 4.5 3.8   < > 4.1 4.1 4.2  CL 103 103  --   --   --  107  CO2 24 25  --   --   --  24  GLUCOSE 149* 132*  --   --   --  137*  BUN 23 25*  --   --   --  23  CREATININE 0.98 0.86  --   --   --  0.83  CALCIUM 9.3 9.3  --   --   --  8.8*  GFRNONAA >60 >60   --   --   --  >60   < > = values in this interval not displayed.   estimated creatinine clearance is 161.7 mL/min (by C-G formula based on SCr of 0.83 mg/dL).  CMP Latest Ref Rng & Units 09/24/2020 09/10/2020 09/10/2020  Glucose 70 - 99 mg/dL 09/12/2020) - -  BUN 8 - 23 mg/dL 23 - -  Creatinine 09/12/2020 - 1.24 mg/dL 195(K - -  Sodium 9.32 - 145 mmol/L 138 138 139  Potassium  3.5 - 5.1 mmol/L 4.2 4.1 4.1  Chloride 98 - 111 mmol/L 107 - -  CO2 22 - 32 mmol/L 24 - -  Calcium 8.9 - 10.3 mg/dL 6.7(Y) - -  Total Protein 6.5 - 8.1 g/dL - - -  Total Bilirubin 0.3 - 1.2 mg/dL - - -  Alkaline Phos 38 - 126 U/L - - -  AST 15 - 41 U/L - - -  ALT 0 - 44 U/L - - -   CBC Latest Ref Rng & Units 09/10/2020 09/10/2020 09/10/2020  WBC 4.0 - 10.5 K/uL - - -  Hemoglobin 13.0 - 17.0 g/dL 19.5 09.3 26.7  Hematocrit 39.0 - 52.0 % 47.0 45.0 46.0  Platelets 150 - 400 K/uL - - -    Lipid Panel No results for input(s): CHOL, TRIG, LDLCALC, VLDL, HDL, CHOLHDL, LDLDIRECT in the last 8760 hours.  HEMOGLOBIN A1C Lab Results  Component Value Date   HGBA1C 6.6 (H) 09/09/2020   MPG 142.72 09/09/2020   TSH Recent Labs    09/08/20 2258  TSH 1.420    External labs:   None   Medications and allergies  No Known Allergies   Outpatient Medications Prior to Visit  Medication Sig Dispense Refill  . aspirin EC 81 MG tablet Take 81 mg by mouth daily.    Marland Kitchen atorvastatin (LIPITOR) 10 MG tablet Take 10 mg by mouth at bedtime.    . carvedilol (COREG) 6.25 MG tablet Take 1 tablet (6.25 mg total) by mouth 2 (two) times daily with a meal. 180 tablet 1  . furosemide (LASIX) 20 MG tablet Take 20 mg by mouth 2 (two) times a week. Wednesday and Sunday    . insulin glargine (LANTUS) 100 UNIT/ML injection Inject 30 Units into the skin daily.    . metFORMIN (GLUCOPHAGE) 500 MG tablet Take 1,000 mg by mouth 2 (two) times daily with a meal.    . Multiple Vitamin (MULTIVITAMIN WITH MINERALS) TABS tablet Take 1 tablet by mouth every evening.      . sacubitril-valsartan (ENTRESTO) 24-26 MG Take 1 tablet by mouth 2 (two) times daily. 60 tablet 3  . spironolactone (ALDACTONE) 25 MG tablet Take 0.5 tablets (12.5 mg total) by mouth daily. 45 tablet 3   No facility-administered medications prior to visit.     Radiology:   No results found.  DG Chest 2 View 09/08/2020 The heart is enlarged. There is peribronchial and interstitial thickening. No significant pleural effusion. No confluent consolidation. No pneumothorax. Remote right rib fractures. No acute osseous abnormalities are seen. IMPRESSION: Cardiomegaly. Peribronchial and interstitial thickening, may be pulmonary edema or bronchitis.   CT HEAD WO CONTRAST 09/08/2020 FINDINGS: Brain: Mild atrophy. Normal ventricular morphology. No midline shift or mass effect. Otherwise normal appearance of brain parenchyma. No intracranial hemorrhage, mass lesion, or evidence of acute infarction. No extra-axial fluid collections. Vascular: No hyperdense vessels. Skull: Demineralized but intact Sinuses/Orbits: Opacified mastoid air cells bilaterally. Paranasal sinuses clear. Other: N/A  IMPRESSION: Generalized atrophy. No acute intracranial abnormalities. BILATERAL mastoid effusions.   Cardiac Studies:   Ambulatory cardiac telemetry: Pending  Bilateral DVT study 08/15/2020: BILATERAL:  -No evidence of popliteal cyst, bilaterally.  RIGHT:  - There is no evidence of deep vein thrombosis in the lower extremity.  However, portions of this examination were limited- see technologist  comments above.  LEFT:  - There is no evidence of deep vein thrombosis in the lower extremity.  However, portions of this examination were limited- see technologist  comments  above.  Right + Left Heart Catheterization 09/10/20  Hemodynamic data: RA 12/11, mean 8 mmHg.  RA saturation 77%.  RV 37/5, EDP 10 mmHg.  PA 35/22, mean 28 mmHg.  PA saturation 81%.  PW 18/18, mean 16 mmHg.  Aortic saturation 98%. QT/QS 1.24  suggest insignificant left-to-right shunting.  May be at the right atrial level. Cardiac output 8.69, cardiac index 2.93 by Fick. LV 145/13, EDP 21 mmHg.  Aorta 112/25 with a mean of 91 mmHg. No pressure gradient across the aortic valve. Angiographic data: LV: Dilated.  Global hypokinesis.  EF 30 to 35%.  EDP mildly elevated. Right dominant circulation.  No significant coronary artery disease with D1 being equivalent to ramus intermediate, large vessel.  All vessels are mild luminal irregularity. 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  He also has left to right shunt however may be clinically insignificant.  Shunt potentially could be at the level of right atrium. Recommendation: Therapy for nonischemic dilated cardiomyopathy.  He is well compensated.  Can be discharged at any point from cardiac standpoint.  Outpatient follow-up has been set up.  Echocardiogram 09/09/2020:   1. Left ventricular ejection fraction, by estimation, is 30%. The left ventricle has moderate to severely decreased function. The left ventricle demonstrates global hypokinesis. The left ventricular internal cavity size was moderately dilated. Left  ventricular diastolic parameters are consistent with Grade I diastolic dysfunction (impaired relaxation). 2. Right ventricular systolic function was not well visualized. The right ventricular size is not well visualized. 3. The mitral valve is normal in structure. No evidence of mitral valve regurgitation. No evidence of mitral stenosis. 4. The aortic valve is tricuspid. Aortic valve regurgitation is not visualized. No aortic stenosis is present. 5. Aortic dilatation noted. There is mild dilatation of the aortic root, measuring 37 mm. 6. The inferior vena cava is dilated in size with >50% respiratory variability, suggesting right atrial pressure of 8  mmHg. 7. Technically difficult study with poor acoustic windows.  Compared to 12/14/2018, LVEF was normal.  EKG:   EKG 10/01/2020: Sinus rhythm at a rate of 77 bpm.  Left axis, left anterior fascicular block.  Right bundle branch block.  Bifascicular block.  Low voltage complexes.  No ST-T wave changes suggestive of ischemia or underlying injury pattern. Pulmonary disease pattern.  Compared to EKG 09/09/2020, no bigeminy.  EKG 09/09/2020: Normal sinus rhythm at rate of 86 bpm, left axis deviation, left anterior fascicular block.  Right bundle branch block.  Bifascicular block.  Poor R wave progression.  Low-voltage complexes.  Frequent PVCs in bigeminal pattern.  No ST-T wave changes of ischemia, pulmonary disease pattern.  Assessment     ICD-10-CM   1. Chronic systolic heart failure (HCC)  X44.81 EKG 12-Lead  2. Primary hypertension  I10   3. Morbid obesity (HCC)  E66.01   4. Uncontrolled type 2 diabetes mellitus with hyperglycemia (HCC)  E11.65   5. Premature ventricular beats  I49.3      There are no discontinued medications.  No orders of the defined types were placed in this encounter.   Recommendations:   [name] is a 65 y.o. Caucasian male with hypertension, hyperlipidemia, type 2 diabetes mellitus, chronic lymphedema of the bilateral lower extremity, morbid obesity, tobacco use disorder quit in November 2021, who lives by himself and is presently disabled.  He underwent left and right heart catheterization on 09/10/2020 revealing nonischemic cardiomyopathy  and mild pulmonary hypertension, WHO group 2.  Patient now presents for follow up.  Patient is presently feeling relatively well without clinical signs of heart failure.  Blood pressure is well controlled. In view of LVEF of 30% would recommend uptitration of Entresto, however patient is very anxious about making medication changes at this time as he is transitioning to live in a hotel for the next 3 weeks as his apartment is  being renovated. Patient;s dyspnea on exertion has improved and continues to do so. He has had no recurrence of dizziness since discharge. Patient is currently taking Lasix 20 mg 4-5 time per week. Encouraged patient to continue to slowly increase physical activity. Advised regarding low salt diet and diet and lifestyle modifications to reduce cardiovascular risk.   Patient does have right shin wound, advised him to follow up with wound management as needed in view of chronic lymphedema.   Patient presently has follow-up scheduled with her vascular transition clinic in 4 weeks, and therefore we will schedule follow-up in our office in 6 weeks.  During this visit I reviewed and updated: Tobacco history  allergies medication reconciliation  medical history  surgical history  family history  social history.  This note was created using a voice recognition software as a result there may be grammatical errors inadvertently enclosed that do not reflect the nature of this encounter. Every attempt is made to correct such errors.   Matthew Halsted, PA-C 10/08/2020, 8:28 AM Office: [phone]

## 2020-10-03 ENCOUNTER — Telehealth: Payer: Self-pay

## 2020-10-03 ENCOUNTER — Telehealth (HOSPITAL_COMMUNITY): Payer: Self-pay | Admitting: Pharmacy Technician

## 2020-10-03 NOTE — Telephone Encounter (Signed)
Per Raina at [facility] prior Berkley Harvey is not required for CPT 95800  RainaP 13:55 [id number].  Pt reports he has been moving due to renovations of his apt.  He plans to complete sleep study soon.  He states he understands instructions he was given.

## 2020-10-03 NOTE — Telephone Encounter (Signed)
Advanced Heart Failure Patient Advocate Encounter   Patient was approved to receive Entresto from [employer]  Patient ID: [id number] Effective dates: 09/30/20 through 06/14/21  Called and spoke with the patient.   Archer Asa, CPhT

## 2020-10-14 DIAGNOSIS — I493 Ventricular premature depolarization: Secondary | ICD-10-CM | POA: Diagnosis not present

## 2020-10-16 NOTE — Addendum Note (Signed)
Encounter addended by: Crissie Figures, RN on: 10/16/2020 10:14 AM  Actions taken: Imaging Exam ended

## 2020-10-28 ENCOUNTER — Encounter (HOSPITAL_COMMUNITY): Payer: PPO

## 2020-10-31 ENCOUNTER — Telehealth (HOSPITAL_COMMUNITY): Payer: Self-pay | Admitting: Surgery

## 2020-10-31 NOTE — Telephone Encounter (Signed)
I called [name] regarding his home sleep study ordered.  He has not an opportunity to complete the study secondary to being displaced from his apartment for an extended time.  He is now back and plans to complete that study this weekend.

## 2020-11-05 ENCOUNTER — Encounter: Payer: Self-pay | Admitting: Internal Medicine

## 2020-11-06 ENCOUNTER — Encounter (HOSPITAL_COMMUNITY): Payer: PPO

## 2020-11-10 NOTE — Progress Notes (Signed)
Will do and discus with patient, may be Mexilitine in view of his age. Always appreciate your input Jesusita Oka.

## 2020-11-14 ENCOUNTER — Ambulatory Visit: Payer: PPO | Admitting: Student

## 2020-11-14 ENCOUNTER — Ambulatory Visit: Payer: PPO

## 2020-11-14 DIAGNOSIS — I5022 Chronic systolic (congestive) heart failure: Secondary | ICD-10-CM

## 2020-11-20 NOTE — Progress Notes (Signed)
Primary Physician/Referring:  [name], MD  Patient ID: AREND BAHL, male    DOB: Oct 15, 1955, 65 y.o.   MRN: [id number]  Chief Complaint  Patient presents with   Chronic systolic heart failure   Hyperlipidemia   Hypertension   Follow-up   HPI:    RICHAR DUNKLEE  is a 65 y.o. Caucasian male with hypertension, hyperlipidemia, type 2 diabetes mellitus, chronic lymphedema of the bilateral lower extremity, morbid obesity, tobacco use disorder quit in November 2021, who lives by himself and is presently disabled.   Patient was admitted 09/08/2020 to the hospital with dizziness and generalized weakness in his legs.  Echocardiogram at the time revealed severely reduced LVEF in view of cardiovascular risk factors patient underwent left and right heart catheterization.  Heart catheterization revealed mildly elevated LVEDP and no significant coronary artery disease.  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 Guideline direct medical therapy was initiated. However, previously shared decision has been to avoid SGLT2 inhibitors due to patient's history of peripheral vascular disease, scrotal cellulitis, and poor hygiene.   Patient presents for 6-week follow-up of heart failure.  Last visit had recommended up titration of Entresto, however patient was hesitant as he was moving into a hotel for a period of time.  Since last visit amatory cardiac telemetry revealed frequent PVCs with 10.7% burden.  Discussion between Dr. Jacinto Halim and Dr. Gala Romney recommended initiation of mexiletine at 150 mg twice daily. Patient is feeling well overall. His wound noted at last visit has healed. He has now moved back into his renovated apartment. Patient denies dyspnea, chest pain, dizziness, syncope, near-syncope, orthopnea, PND. He is presently taking Lasix 20 mg every other day.   Patient has not yet unpacked his home  blood pressure monitor, so no readings available. Patient has chronic lymphedema which is presently stable.   Patient is in the process of undergoing sleep apnea evaluation at this time as well.  Past Medical History:  Diagnosis Date   Arthritis    Diabetes mellitus without complication (HCC)    Hypertension    Lymphedema 09/09/2020   Wound drainage    right leg 3/4 inch area open wound with clear drainage last 2 days, covering with gauze prn   Past Surgical History:  Procedure Laterality Date   COLONOSCOPY  2014 ?   FLEXIBLE SIGMOIDOSCOPY N/A 08/27/2014   Procedure: FLEXIBLE SIGMOIDOSCOPY;  Surgeon: [name], MD;  Location: [facility];  Service: Endoscopy;  Laterality: N/A;   NO PAST SURGERIES     RIGHT/LEFT HEART CATH AND CORONARY ANGIOGRAPHY N/A 09/10/2020   Procedure: RIGHT/LEFT HEART CATH AND CORONARY ANGIOGRAPHY;  Surgeon: [name], MD;  Location: [facility];  Service: Cardiovascular;  Laterality: N/A;   Family History  Problem Relation Age of Onset   Heart disease Mother    Heart attack Father     Social History   Tobacco Use   Smoking status: Former    Packs/day: 1.50    Years: 42.00    Pack years: 63.00    Types: Cigarettes    Quit date: 05/03/2020    Years since quitting: 0.5   Smokeless tobacco: Never   Tobacco comments:    congratulated and encouraged continued cessation.  Substance Use Topics   Alcohol use: No   Marital Status: Divorced   ROS  Review of Systems  Constitutional: Negative for malaise/fatigue and weight gain.  Cardiovascular:  Negative for chest pain, claudication, dyspnea on exertion (resolved), near-syncope, orthopnea, palpitations, paroxysmal nocturnal dyspnea and syncope. Leg swelling: chronic lymphedema. Respiratory:  Negative for shortness of breath.   Hematologic/Lymphatic: Does not bruise/bleed easily.  Gastrointestinal:  Negative for melena.  Neurological:  Negative for dizziness and weakness.   Objective   Blood pressure 119/73, pulse 84, temperature (!) 96.3 F (35.7 C), height 6' (1.829 m), weight (!) 433 lb (196.4 kg), SpO2 97 %.  Vitals with BMI 11/21/2020 10/01/2020 09/24/2020  Height 6\' 0"  6\' 0"  6\' 0"   Weight 433 lbs 435 lbs 435 lbs 3 oz  BMI 58.71 58.98 59.01  Systolic 119 130 161  Diastolic 73 75 88  Pulse 84 82 84     Physical Exam Vitals reviewed.  Constitutional:      Comments: Morbidly obese   HENT:     Head: Normocephalic and atraumatic.  Cardiovascular:     Rate and Rhythm: Normal rate and regular rhythm.     Pulses: Intact distal pulses.          Carotid pulses are 2+ on the right side and 2+ on the left side.      Dorsalis pedis pulses are 1+ on the right side and 2+ on the left side.       Posterior tibial pulses are 0 on the right side and 0 on the left side.     Heart sounds: S1 normal and S2 normal. No murmur heard.   No gallop.     Comments: Femoral and popliteal pulse difficult to feel due to patient's body habitus.  Pulmonary:     Effort: Pulmonary effort is normal. No respiratory distress.     Breath sounds: No wheezing, rhonchi or rales.  Musculoskeletal:     Right lower leg: Edema (Non-pitting, chronic venostasis changes noted) present.     Left lower leg: Edema (Non-pitting, chronic venostasis changes noted) present.  Skin:    Comments: No open wounds or ulcers.   Neurological:     Mental Status: He is alert.   Laboratory examination:   Recent Labs    09/08/20 1704 09/09/20 0344 09/10/20 1511 09/10/20 1516 09/10/20 1520 09/24/20 1108  NA 135 136   < > 139 138 138  K 4.5 3.8   < > 4.1 4.1 4.2  CL 103 103  --   --   --  107  CO2 24 25  --   --   --  24  GLUCOSE 149* 132*  --   --   --  137*  BUN 23 25*  --   --   --  23  CREATININE 0.98 0.86  --   --   --  0.83  CALCIUM 9.3 9.3  --   --   --  8.8*  GFRNONAA >60 >60  --   --   --  >60   < > = values in this interval not displayed.   CrCl cannot be calculated (Patient's most recent lab  result is older than the maximum 21 days allowed.).  CMP Latest Ref Rng & Units 09/24/2020 09/10/2020 09/10/2020  Glucose 70 - 99 mg/dL 096(E) - -  BUN 8 - 23 mg/dL 23 - -  Creatinine 4.54 - 1.24 mg/dL 0.98 - -  Sodium 119 - 145 mmol/L 138 138 139  Potassium 3.5 - 5.1 mmol/L 4.2 4.1 4.1  Chloride 98 - 111 mmol/L 107 - -  CO2 22 - 32 mmol/L 24 - -  Calcium  8.9 - 10.3 mg/dL 6.6(A) - -  Total Protein 6.5 - 8.1 g/dL - - -  Total Bilirubin 0.3 - 1.2 mg/dL - - -  Alkaline Phos 38 - 126 U/L - - -  AST 15 - 41 U/L - - -  ALT 0 - 44 U/L - - -   CBC Latest Ref Rng & Units 09/10/2020 09/10/2020 09/10/2020  WBC 4.0 - 10.5 K/uL - - -  Hemoglobin 13.0 - 17.0 g/dL 63.0 16.0 10.9  Hematocrit 39.0 - 52.0 % 47.0 45.0 46.0  Platelets 150 - 400 K/uL - - -    Lipid Panel No results for input(s): CHOL, TRIG, LDLCALC, VLDL, HDL, CHOLHDL, LDLDIRECT in the last 8760 hours.  HEMOGLOBIN A1C Lab Results  Component Value Date   HGBA1C 6.6 (H) 09/09/2020   MPG 142.72 09/09/2020   TSH Recent Labs    09/08/20 2258  TSH 1.420    External labs:  None   Allergies  No Known Allergies    Medications Prior to Visit:   Outpatient Medications Prior to Visit  Medication Sig Dispense Refill   aspirin EC 81 MG tablet Take 81 mg by mouth daily.     atorvastatin (LIPITOR) 10 MG tablet Take 10 mg by mouth at bedtime.     carvedilol (COREG) 6.25 MG tablet Take 1 tablet (6.25 mg total) by mouth 2 (two) times daily with a meal. 180 tablet 1   furosemide (LASIX) 20 MG tablet Take 20 mg by mouth 3 (three) times a week.     insulin glargine (LANTUS) 100 UNIT/ML injection Inject 30 Units into the skin daily.     metFORMIN (GLUCOPHAGE) 500 MG tablet Take 1,000 mg by mouth 2 (two) times daily with a meal.     Multiple Vitamin (MULTIVITAMIN WITH MINERALS) TABS tablet Take 1 tablet by mouth every evening.      sacubitril-valsartan (ENTRESTO) 24-26 MG Take 1 tablet by mouth 2 (two) times daily. 60 tablet 3    spironolactone (ALDACTONE) 25 MG tablet Take 0.5 tablets (12.5 mg total) by mouth daily. 45 tablet 3   No facility-administered medications prior to visit.     Final Medications at End of Visit    Current Meds  Medication Sig   aspirin EC 81 MG tablet Take 81 mg by mouth daily.   atorvastatin (LIPITOR) 10 MG tablet Take 10 mg by mouth at bedtime.   carvedilol (COREG) 6.25 MG tablet Take 1 tablet (6.25 mg total) by mouth 2 (two) times daily with a meal.   furosemide (LASIX) 20 MG tablet Take 20 mg by mouth 3 (three) times a week.   insulin glargine (LANTUS) 100 UNIT/ML injection Inject 30 Units into the skin daily.   metFORMIN (GLUCOPHAGE) 500 MG tablet Take 1,000 mg by mouth 2 (two) times daily with a meal.   mexiletine (MEXITIL) 150 MG capsule Take 1 capsule (150 mg total) by mouth 2 (two) times daily.   Multiple Vitamin (MULTIVITAMIN WITH MINERALS) TABS tablet Take 1 tablet by mouth every evening.    sacubitril-valsartan (ENTRESTO) 24-26 MG Take 1 tablet by mouth 2 (two) times daily.   spironolactone (ALDACTONE) 25 MG tablet Take 0.5 tablets (12.5 mg total) by mouth daily.   Radiology:   No results found.  DG Chest 2 View 09/08/2020 The heart is enlarged. There is peribronchial and interstitial thickening. No significant pleural effusion. No confluent consolidation. No pneumothorax. Remote right rib fractures. No acute osseous abnormalities are seen. IMPRESSION: Cardiomegaly. Peribronchial and interstitial  thickening, may be pulmonary edema or bronchitis.    CT HEAD WO CONTRAST 09/08/2020 FINDINGS: Brain: Mild atrophy. Normal ventricular morphology. No midline shift or mass effect. Otherwise normal appearance of brain parenchyma. No intracranial hemorrhage, mass lesion, or evidence of acute infarction. No extra-axial fluid collections. Vascular: No hyperdense vessels. Skull: Demineralized but intact Sinuses/Orbits: Opacified mastoid air cells bilaterally. Paranasal sinuses clear. Other:  N/A  IMPRESSION: Generalized atrophy. No acute intracranial abnormalities. BILATERAL mastoid effusions.   Cardiac Studies:   Ambulatory cardiac telemetry 09/17/2020- 10/01/2020: 1. Sinus rhythm -  avg HR of 80 bpm.  2. Two nonsustained VT runs occurred, the run with the fastest interval lasting 4 beats with a max rate of 121 bpm (avg 101 bpm); the run with the fastest interval was also the longest.  3. 59 Supraventricular Tachycardia runs occurred, the run with the fastest interval lasting 5 beats with a max rate of 187 bpm, the longest lasting 17.5 secs with  an avg rate of 115 bpm. 4.  Isolated PACs were occasional (1.2%, 16980),  5. Frequent PVCs (10.7%, [id number]) - two predominant morphologies (5.7% and 5.0%)  Bilateral DVT study 08/15/2020: BILATERAL:  -No evidence of popliteal cyst, bilaterally.  RIGHT:  - There is no evidence of deep vein thrombosis in the lower extremity.  However, portions of this examination were limited- see technologist  comments above.  LEFT:  - There is no evidence of deep vein thrombosis in the lower extremity.  However, portions of this examination were limited- see technologist  comments above.  Right + Left Heart Catheterization 09/10/20  Hemodynamic data: RA 12/11, mean 8 mmHg.  RA saturation 77%.  RV 37/5, EDP 10 mmHg.  PA 35/22, mean 28 mmHg.  PA saturation 81%.  PW 18/18, mean 16 mmHg.  Aortic saturation 98%. QT/QS 1.24 suggest insignificant left-to-right shunting.  May be at the right atrial level. Cardiac output 8.69, cardiac index 2.93 by Fick. LV 145/13, EDP 21 mmHg.  Aorta 112/25 with a mean of 91 mmHg. No pressure gradient across the aortic valve. Angiographic data: LV: Dilated.  Global hypokinesis.  EF 30 to 35%.  EDP mildly elevated. Right dominant circulation.  No significant coronary artery disease with D1 being equivalent to ramus intermediate, large vessel.  All vessels are mild luminal irregularity.  Impression: Nonischemic dilated  cardiomyopathy with mild pulmonary hypertension, WHO group 2 with mild elevation in LVEDP with preserved cardiac output and index.  He also has left to right shunt however may be clinically insignificant.  Shunt potentially could be at the level of right atrium.  Recommendation: Therapy for nonischemic dilated cardiomyopathy.  He is well compensated.  Can be discharged at any point from cardiac standpoint.  Outpatient follow-up has been set up.  Echocardiogram 09/09/2020:    1. Left ventricular ejection fraction, by estimation, is 30%. The left ventricle has moderate to severely decreased function. The left ventricle demonstrates global hypokinesis. The left ventricular internal cavity size was moderately dilated. Left  ventricular diastolic parameters are consistent with Grade I diastolic dysfunction (impaired relaxation).  2. Right ventricular systolic function was not well visualized. The right ventricular size is not well visualized.  3. The mitral valve is normal in structure. No evidence of mitral valve regurgitation. No evidence of mitral stenosis.  4. The aortic valve is tricuspid. Aortic valve regurgitation is not visualized. No aortic stenosis is present.  5. Aortic dilatation noted. There is mild dilatation of the aortic root, measuring 37 mm.  6. The inferior vena  cava is dilated in size with >50% respiratory variability, suggesting right atrial pressure of 8 mmHg.  7. Technically difficult study with poor acoustic windows.  Compared to 12/14/2018, LVEF was normal.  EKG:   EKG 10/01/2020: Sinus rhythm at a rate of 77 bpm.  Left axis, left anterior fascicular block.  Right bundle branch block.  Bifascicular block.  Low voltage complexes.  No ST-T wave changes suggestive of ischemia or underlying injury pattern. Pulmonary disease pattern.  Compared to EKG 09/09/2020, no bigeminy.  EKG 09/09/2020: Normal sinus rhythm at rate of 86 bpm, left axis deviation, left anterior fascicular block.  Right  bundle branch block.  Bifascicular block.  Poor R wave progression.  Low-voltage complexes.  Frequent PVCs in bigeminal pattern.  No ST-T wave changes of ischemia, pulmonary disease pattern.  Assessment     ICD-10-CM   1. Chronic systolic heart failure (HCC)  Z61.09I50.22     2. Primary hypertension  I10     3. Premature ventricular beats  I49.3     4. Morbid obesity (HCC)  E66.01        There are no discontinued medications.  Meds ordered this encounter  Medications   mexiletine (MEXITIL) 150 MG capsule    Sig: Take 1 capsule (150 mg total) by mouth 2 (two) times daily.    Dispense:  180 capsule    Refill:  3    Recommendations:   Katheren ShamsCharles J Perman is a 65 y.o. Caucasian male with hypertension, hyperlipidemia, type 2 diabetes mellitus, chronic lymphedema of the bilateral lower extremity, morbid obesity, tobacco use disorder quit in November 2021, who lives by himself and is presently disabled.  He underwent left and right heart catheterization on 09/10/2020 revealing nonischemic cardiomyopathy and mild pulmonary hypertension, WHO group 2.  Patient was admitted 09/08/2020 to the hospital with dizziness and generalized weakness in his legs.  Echocardiogram at the time revealed severely reduced LVEF in view of cardiovascular risk factors patient underwent left and right heart catheterization.  Heart catheterization revealed mildly elevated LVEDP and no significant coronary artery disease.  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 Guideline direct medical therapy was initiated. However, previously shared decision has been to avoid SGLT2 inhibitors due to patient's history of peripheral vascular disease, scrotal cellulitis, and poor hygiene.   Patient presents for 6-week follow-up of heart failure.  Last visit had recommended up titration of Entresto, however patient was hesitant as he was moving into a  hotel for a period of time.  Since last visit amatory cardiac telemetry revealed frequent PVCs with 10.7% burden.  Discussion between Dr. Jacinto HalimGanji and Dr. Gala RomneyBensimhon recommended initiation of mexiletine at 150 mg twice daily. Patient is agreeable to start Mexiletine. Will plan to repeat cardiac monitor following initiation of therapy.   Patient is presently tolerating guideline directe medical therapy without issue. Will continue aspirin, atorvastatin, carvedilol, Entresto, and spironolactone.  We will continue to hold off on addition of Farxiga at this time.  Would like to [employer] Entresto, however patient's blood pressure is a little soft in the office today.  Shared decision was for patient to monitor home blood pressure readings on a regular basis and bring a written log with him to his next visit.  We will consider increasing Entresto at next office visit.  Heart rate remains >70 bpm, therefore could consider up titration of carvedilol versus addition of Corlanor in the future.  We will  reevaluate heart failure medication adjustments at next office visit.  Again discussed at length with patient regarding importance of diet and lifestyle modifications, particularly weight loss.  Follow-up in 3 weeks, sooner if needed, for heart failure and medication titration.   Rayford Halsted, PA-C 11/21/2020, 10:54 AM Office: [phone]

## 2020-11-21 ENCOUNTER — Encounter: Payer: Self-pay | Admitting: Student

## 2020-11-21 ENCOUNTER — Other Ambulatory Visit: Payer: Self-pay

## 2020-11-21 ENCOUNTER — Ambulatory Visit: Payer: PPO | Admitting: Student

## 2020-11-21 VITALS — BP 119/73 | HR 84 | Temp 96.3°F | Ht 72.0 in | Wt >= 6400 oz

## 2020-11-21 DIAGNOSIS — I5022 Chronic systolic (congestive) heart failure: Secondary | ICD-10-CM | POA: Diagnosis not present

## 2020-11-21 DIAGNOSIS — I493 Ventricular premature depolarization: Secondary | ICD-10-CM | POA: Diagnosis not present

## 2020-11-21 DIAGNOSIS — I10 Essential (primary) hypertension: Secondary | ICD-10-CM

## 2020-11-21 MED ORDER — MEXILETINE HCL 150 MG PO CAPS: 150.0000 mg | ORAL_CAPSULE | Freq: Two times a day (BID) | ORAL | 3 refills | Status: DC

## 2020-12-12 ENCOUNTER — Ambulatory Visit: Payer: PPO | Admitting: Student

## 2020-12-25 ENCOUNTER — Ambulatory Visit: Payer: PPO | Admitting: Student

## 2020-12-25 NOTE — Progress Notes (Deleted)
Primary Physician/Referring:  Renford DillsPolite, Ronald, MD  Patient ID: [name], male    DOB: 11/24/1955, 65 y.o.   MRN: [id number]  No chief complaint on file.  HPI:    Matthew ShamsCharles J Lizaola  is a 65 y.o. Caucasian male with hypertension, hyperlipidemia, type 2 diabetes mellitus, chronic lymphedema of the bilateral lower extremity, morbid obesity, tobacco use disorder quit in November 2021.  Patient diagnosed with HFrEF with heart cath revealing nonischemic cardiomyopathy and mild pulmonary hypertension, who group 2 in March 2022.  Notably patient lives by himself and is presently disabled.   At last visit ambulatory cardiac telemetry revealed frequent PVCs with 10.7% burden, therefore patient was started on mexiletine 150 mg twice daily.  Patient now presents for 3-week follow-up of heart failure and medication titration. ***  ***needs monitor.   Patient was admitted 09/08/2020 to the hospital with dizziness and generalized weakness in his legs.  Echocardiogram at the time revealed severely reduced LVEF in view of cardiovascular risk factors patient underwent left and right heart catheterization.  Heart catheterization revealed mildly elevated LVEDP and no significant coronary artery disease.  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 Guideline direct medical therapy was initiated. However, previously shared decision has been to avoid SGLT2 inhibitors due to patient's history of peripheral vascular disease, scrotal cellulitis, and poor hygiene.   Patient presents for 6-week follow-up of heart failure.  Last visit had recommended up titration of Entresto, however patient was hesitant as he was moving into a hotel for a period of time.  Since last visit amatory cardiac telemetry revealed frequent PVCs with 10.7% burden.  Discussion between Dr. Jacinto HalimGanji and Dr. Gala RomneyBensimhon recommended initiation of mexiletine at 150  mg twice daily. Patient is feeling well overall. His wound noted at last visit has healed. He has now moved back into his renovated apartment. Patient denies dyspnea, chest pain, dizziness, syncope, near-syncope, orthopnea, PND. He is presently taking Lasix 20 mg every other day.   Patient has not yet unpacked his home blood pressure monitor, so no readings available. Patient has chronic lymphedema which is presently stable.   Patient is in the process of undergoing sleep apnea evaluation at this time as well.  Past Medical History:  Diagnosis Date   Arthritis    Diabetes mellitus without complication (HCC)    Hypertension    Lymphedema 09/09/2020   Wound drainage    right leg 3/4 inch area open wound with clear drainage last 2 days, covering with gauze prn   Past Surgical History:  Procedure Laterality Date   COLONOSCOPY  2014 ?   FLEXIBLE SIGMOIDOSCOPY N/A 08/27/2014   Procedure: FLEXIBLE SIGMOIDOSCOPY;  Surgeon: Charolett BumpersMartin K Johnson, MD;  Location: [facility];  Service: Endoscopy;  Laterality: N/A;   NO PAST SURGERIES     RIGHT/LEFT HEART CATH AND CORONARY ANGIOGRAPHY N/A 09/10/2020   Procedure: RIGHT/LEFT HEART CATH AND CORONARY ANGIOGRAPHY;  Surgeon: Yates DecampGanji, Jay, MD;  Location: [facility];  Service: Cardiovascular;  Laterality: N/A;   Family History  Problem Relation Age of Onset   Heart disease Mother    Heart attack Father     Social History   Tobacco Use   Smoking status: Former    Packs/day: 1.50    Years: 42.00    Pack years: 63.00    Types: Cigarettes    Quit date: 05/03/2020    Years since quitting: 0.6  Smokeless tobacco: Never   Tobacco comments:    congratulated and encouraged continued cessation.  Substance Use Topics   Alcohol use: No   Marital Status: Divorced   ROS  Review of Systems  Constitutional: Negative for malaise/fatigue and weight gain.  Cardiovascular:  Negative for chest pain, claudication, dyspnea on exertion (resolved),  near-syncope, orthopnea, palpitations, paroxysmal nocturnal dyspnea and syncope. Leg swelling: chronic lymphedema. Respiratory:  Negative for shortness of breath.   Hematologic/Lymphatic: Does not bruise/bleed easily.  Gastrointestinal:  Negative for melena.  Neurological:  Negative for dizziness and weakness.   Objective  There were no vitals taken for this visit.  Vitals with BMI 11/21/2020 10/01/2020 09/24/2020  Height 6\' 0"  6\' 0"  6\' 0"   Weight 433 lbs 435 lbs 435 lbs 3 oz  BMI 58.71 58.98 59.01  Systolic 119 130  Diastolic 73 75 88  Pulse 84 82 84     Physical Exam Vitals reviewed.  Constitutional:      Comments: Morbidly obese   HENT:     Head: Normocephalic and atraumatic.  Cardiovascular:     Rate and Rhythm: Normal rate and regular rhythm.     Pulses: Intact distal pulses.          Carotid pulses are 2+ on the right side and 2+ on the left side.      Dorsalis pedis pulses are 1+ on the right side and 2+ on the left side.       Posterior tibial pulses are 0 on the right side and 0 on the left side.     Heart sounds: S1 normal and S2 normal. No murmur heard.   No gallop.     Comments: Femoral and popliteal pulse difficult to feel due to patient's body habitus.  Pulmonary:     Effort: Pulmonary effort is normal. No respiratory distress.     Breath sounds: No wheezing, rhonchi or rales.  Musculoskeletal:     Right lower leg: Edema (Non-pitting, chronic venostasis changes noted) present.     Left lower leg: Edema (Non-pitting, chronic venostasis changes noted) present.  Skin:    Comments: No open wounds or ulcers.   Neurological:     Mental Status: He is alert.   Laboratory examination:   Recent Labs    09/08/20 1704 09/09/20 0344 09/10/20 1511 09/10/20 1516 09/10/20 1520 09/24/20 1108  NA 135 136   < > 139 138 138  K 4.5 3.8   < > 4.1 4.1 4.2  CL 103 103  --   --   --  107  CO2 24 25  --   --   --  24  GLUCOSE 149* 132*  --   --   --  137*  BUN 23 25*  --    --   --  23  CREATININE 0.98 0.86  --   --   --  0.83  CALCIUM 9.3 9.3  --   --   --  8.8*  GFRNONAA >60 >60  --   --   --  >60   < > = values in this interval not displayed.    CrCl cannot be calculated (Patient's most recent lab result is older than the maximum 21 days allowed.).  CMP Latest Ref Rng & Units 09/24/2020 09/10/2020 09/10/2020  Glucose 70 - 99 mg/dL 11/24/2020) - -  BUN 8 - 23 mg/dL 23 - -  Creatinine 09/12/2020 - 1.24 mg/dL 09/12/2020 - -  Sodium 256(L - 145 mmol/L  138 138 139  Potassium 3.5 - 5.1 mmol/L 4.2 4.1 4.1  Chloride 98 - 111 mmol/L 107 - -  CO2 22 - 32 mmol/L 24 - -  Calcium 8.9 - 10.3 mg/dL 8.2(S) - -  Total Protein 6.5 - 8.1 g/dL - - -  Total Bilirubin 0.3 - 1.2 mg/dL - - -  Alkaline Phos 38 - 126 U/L - - -  AST 15 - 41 U/L - - -  ALT 0 - 44 U/L - - -   CBC Latest Ref Rng & Units 09/10/2020 09/10/2020 09/10/2020  WBC 4.0 - 10.5 K/uL - - -  Hemoglobin 13.0 - 17.0 g/dL 60.1 56.1 53.7  Hematocrit 39.0 - 52.0 % 47.0 45.0 46.0  Platelets 150 - 400 K/uL - - -    Lipid Panel No results for input(s): CHOL, TRIG, LDLCALC, VLDL, HDL, CHOLHDL, LDLDIRECT in the last 8760 hours.  HEMOGLOBIN A1C Lab Results  Component Value Date   HGBA1C 6.6 (H) 09/09/2020   MPG 142.72 09/09/2020   TSH Recent Labs    09/08/20 2258  TSH 1.420     External labs:  None   Allergies  No Known Allergies    Medications Prior to Visit:   Outpatient Medications Prior to Visit  Medication Sig Dispense Refill   aspirin EC 81 MG tablet Take 81 mg by mouth daily.     atorvastatin (LIPITOR) 10 MG tablet Take 10 mg by mouth at bedtime.     carvedilol (COREG) 6.25 MG tablet Take 1 tablet (6.25 mg total) by mouth 2 (two) times daily with a meal. 180 tablet 1   furosemide (LASIX) 20 MG tablet Take 20 mg by mouth 3 (three) times a week.     insulin glargine (LANTUS) 100 UNIT/ML injection Inject 30 Units into the skin daily.     metFORMIN (GLUCOPHAGE) 500 MG tablet Take 1,000 mg by mouth 2 (two)  times daily with a meal.     mexiletine (MEXITIL) 150 MG capsule Take 1 capsule (150 mg total) by mouth 2 (two) times daily. 180 capsule 3   Multiple Vitamin (MULTIVITAMIN WITH MINERALS) TABS tablet Take 1 tablet by mouth every evening.      sacubitril-valsartan (ENTRESTO) 24-26 MG Take 1 tablet by mouth 2 (two) times daily. 60 tablet 3   spironolactone (ALDACTONE) 25 MG tablet Take 0.5 tablets (12.5 mg total) by mouth daily. 45 tablet 3   No facility-administered medications prior to visit.     Final Medications at End of Visit    No outpatient medications have been marked as taking for the 12/25/20 encounter (Appointment) with [name], Matthew Pomplun [name], [name].   Radiology:   No results found.  DG Chest 2 View 09/08/2020 The heart is enlarged. There is peribronchial and interstitial thickening. No significant pleural effusion. No confluent consolidation. No pneumothorax. Remote right rib fractures. No acute osseous abnormalities are seen. IMPRESSION: Cardiomegaly. Peribronchial and interstitial thickening, may be pulmonary edema or bronchitis.    CT HEAD WO CONTRAST 09/08/2020 FINDINGS: Brain: Mild atrophy. Normal ventricular morphology. No midline shift or mass effect. Otherwise normal appearance of brain parenchyma. No intracranial hemorrhage, mass lesion, or evidence of acute infarction. No extra-axial fluid collections. Vascular: No hyperdense vessels. Skull: Demineralized but intact Sinuses/Orbits: Opacified mastoid air cells bilaterally. Paranasal sinuses clear. Other: N/A  IMPRESSION: Generalized atrophy. No acute intracranial abnormalities. BILATERAL mastoid effusions.   Cardiac Studies:   Ambulatory cardiac telemetry 09/17/2020- 10/01/2020: 1. Sinus rhythm -  avg HR of 80 bpm.  2. Two nonsustained VT runs occurred, the run with the fastest interval lasting 4 beats with a max rate of 121 bpm (avg 101 bpm); the run with the fastest interval was also the longest.  3. 59 Supraventricular  Tachycardia runs occurred, the run with the fastest interval lasting 5 beats with a max rate of 187 bpm, the longest lasting 17.5 secs with  an avg rate of 115 bpm. 4.  Isolated PACs were occasional (1.2%, 16980),  5. Frequent PVCs (10.7%, [id number]) - two predominant morphologies (5.7% and 5.0%)  Bilateral DVT study 08/15/2020: BILATERAL:  -No evidence of popliteal cyst, bilaterally.  RIGHT:  - There is no evidence of deep vein thrombosis in the lower extremity.  However, portions of this examination were limited- see technologist  comments above.  LEFT:  - There is no evidence of deep vein thrombosis in the lower extremity.  However, portions of this examination were limited- see technologist  comments above.  Right + Left Heart Catheterization 09/10/20  Hemodynamic data: RA 12/11, mean 8 mmHg.  RA saturation 77%.  RV 37/5, EDP 10 mmHg.  PA 35/22, mean 28 mmHg.  PA saturation 81%.  PW 18/18, mean 16 mmHg.  Aortic saturation 98%. QT/QS 1.24 suggest insignificant left-to-right shunting.  May be at the right atrial level. Cardiac output 8.69, cardiac index 2.93 by Fick. LV 145/13, EDP 21 mmHg.  Aorta 112/25 with a mean of 91 mmHg. No pressure gradient across the aortic valve. Angiographic data: LV: Dilated.  Global hypokinesis.  EF 30 to 35%.  EDP mildly elevated. Right dominant circulation.  No significant coronary artery disease with D1 being equivalent to ramus intermediate, large vessel.  All vessels are mild luminal irregularity.  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  He also has left to right shunt however may be clinically insignificant.  Shunt potentially could be at the level of right atrium.  Recommendation: Therapy for nonischemic dilated cardiomyopathy.  He is well compensated.  Can be discharged at any point from cardiac standpoint.  Outpatient follow-up has  been set up.  Echocardiogram 09/09/2020:    1. Left ventricular ejection fraction, by estimation, is 30%. The left ventricle has moderate to severely decreased function. The left ventricle demonstrates global hypokinesis. The left ventricular internal cavity size was moderately dilated. Left  ventricular diastolic parameters are consistent with Grade I diastolic dysfunction (impaired relaxation).  2. Right ventricular systolic function was not well visualized. The right ventricular size is not well visualized.  3. The mitral valve is normal in structure. No evidence of mitral valve regurgitation. No evidence of mitral stenosis.  4. The aortic valve is tricuspid. Aortic valve regurgitation is not visualized. No aortic stenosis is present.  5. Aortic dilatation noted. There is mild dilatation of the aortic root, measuring 37 mm.  6. The inferior vena cava is dilated in size with >50% respiratory variability, suggesting right atrial pressure of 8 mmHg.  7. Technically difficult study with poor acoustic windows.  Compared to 12/14/2018, LVEF was normal.  EKG:   EKG 10/01/2020: Sinus rhythm at a rate of 77 bpm.  Left axis, left anterior fascicular block.  Right bundle branch block.  Bifascicular block.  Low voltage complexes.  No ST-T wave changes suggestive of ischemia or underlying injury pattern. Pulmonary disease pattern.  Compared to EKG 09/09/2020, no bigeminy.  EKG 09/09/2020: Normal sinus rhythm at rate of 86 bpm, left axis  deviation, left anterior fascicular block.  Right bundle branch block.  Bifascicular block.  Poor R wave progression.  Low-voltage complexes.  Frequent PVCs in bigeminal pattern.  No ST-T wave changes of ischemia, pulmonary disease pattern.  Assessment   No diagnosis found.    There are no discontinued medications.  No orders of the defined types were placed in this encounter.   Recommendations:   [name] is a 65 y.o. Caucasian male with hypertension,  hyperlipidemia, type 2 diabetes mellitus, chronic lymphedema of the bilateral lower extremity, morbid obesity, tobacco use disorder quit in November 2021.  Patient diagnosed with HFrEF with heart cath revealing nonischemic cardiomyopathy and mild pulmonary hypertension, who group 2 in March 2022.  Notably patient lives by himself and is presently disabled.   At last visit ambulatory cardiac telemetry revealed frequent PVCs with 10.7% burden, therefore patient was started on mexiletine 150 mg twice daily.  Patient now presents for 3-week follow-up of heart failure and medication titration. ***  ***needs monitor.   ***  Patient was admitted 09/08/2020 to the hospital with dizziness and generalized weakness in his legs.  Echocardiogram at the time revealed severely reduced LVEF in view of cardiovascular risk factors patient underwent left and right heart catheterization.  Heart catheterization revealed mildly elevated LVEDP and no significant coronary artery disease.  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 Guideline direct medical therapy was initiated. However, previously shared decision has been to avoid SGLT2 inhibitors due to patient's history of peripheral vascular disease, scrotal cellulitis, and poor hygiene.   Patient presents for 6-week follow-up of heart failure.  Last visit had recommended up titration of Entresto, however patient was hesitant as he was moving into a hotel for a period of time.  Since last visit amatory cardiac telemetry revealed frequent PVCs with 10.7% burden.  Discussion between Dr. Jacinto Halim and Dr. Gala Romney recommended initiation of mexiletine at 150 mg twice daily. Patient is agreeable to start Mexiletine. Will plan to repeat cardiac monitor following initiation of therapy.   Patient is presently tolerating guideline directe medical therapy without issue. Will continue aspirin,  atorvastatin, carvedilol, Entresto, and spironolactone.  We will continue to hold off on addition of Farxiga at this time.  Would like to [employer], however patient's blood pressure is a little soft in the office today.  Shared decision was for patient to monitor home blood pressure readings on a regular basis and bring a written log with him to his next visit.  We will consider increasing Entresto at next office visit.  Heart rate remains >70 bpm, therefore could consider up titration of carvedilol versus addition of Corlanor in the future.  We will reevaluate heart failure medication adjustments at next office visit.  Again discussed at length with patient regarding importance of diet and lifestyle modifications, particularly weight loss.  Follow-up in 3 weeks, sooner if needed, for heart failure and medication titration.   Rayford Halsted, [name] 12/25/2020, 8:36 AM Office: [phone]

## 2020-12-26 ENCOUNTER — Ambulatory Visit: Payer: PPO | Admitting: Student

## 2020-12-30 ENCOUNTER — Telehealth: Payer: Self-pay | Admitting: Cardiology

## 2020-12-30 ENCOUNTER — Telehealth: Payer: Self-pay

## 2020-12-30 ENCOUNTER — Encounter: Payer: Self-pay | Admitting: Cardiology

## 2020-12-30 NOTE — Telephone Encounter (Signed)
Advice not to take lasix and get into hot shower. THis is just Vaso Vagal response. No urgency

## 2020-12-30 NOTE — Telephone Encounter (Signed)
Note Patient called and stated that he had just come out of the shower and dried off and had put on all his clothes and sat down to relax. He began to sweat profusely and checked his HR and it was 120. He did mention that had taken his Lasix 30 minutes prior. No dizziness, just swelling in right leg. Does he need to be seen immediately or can he just keep his appointment for Friday? Please advise.

## 2020-12-30 NOTE — Telephone Encounter (Signed)
Patient had called [place] earlier regarding having taken Lasix and that taking a shower and felt diaphoretic   "Patient called and stated that he had just come out of the shower and dried off and had put on all his clothes and sat down to relax. He began to sweat profusely and checked his HR and it was 120. He did mention that had taken his Lasix 30 minutes prior. No dizziness, just swelling in right leg. Does he need to be seen immediately or can he just keep his appointment for Friday"  Our MA Ms. Darrel Reach call the patient back and did advise that given her to tell him was not to take Lasix and to get into the hot shower and the symptoms are probably vasovagal.  Patient was extremely upset that we did not call him within an hour of the message and he used foul language with our staff.  I called him around 6:05 PM to discuss issues pertaining to this event and his symptoms, but patient would not talk to me but only said "too late to little" and hung up.  Patient will not be seen in our practice and he will be advised to follow-up with his physicians of choice.  I have discussed with my staff/manager regarding unacceptable behaviors and following which will not be tolerated in our practice.   [name], MD, [facility] : [facility] 12/30/2020, 6:08 PM Office: [phone] Fax: [phone] Pager: [phone]   CC: [name], MD

## 2020-12-30 NOTE — Telephone Encounter (Signed)
I called patient, he was highly upset that I did not call him back within the hour of his call. He stated that he called his PCP and he is looking for another cardiologist and wants to have his appointment on Friday canceled. He did not give me a chance to speak at all.

## 2021-01-03 ENCOUNTER — Ambulatory Visit: Payer: PPO | Admitting: Student

## 2021-01-09 ENCOUNTER — Ambulatory Visit (HOSPITAL_BASED_OUTPATIENT_CLINIC_OR_DEPARTMENT_OTHER): Payer: PPO | Admitting: Cardiology

## 2021-01-27 NOTE — Progress Notes (Signed)
Cardiology Office Note   Date:  01/28/2021   ID:  [name], [name] [birth year], MRN [id number]  PCP:  [name], MD  Cardiologist:   Dietrich Pates, MD   Patient presents for f/u of CHF      History of Present Illness: BAUDELIO KARNES is a 65 y.o. male with a history of HTN, HL, DM, lymphedema, morbid obesity  Followed by Dr [name] The patient was admitted in March 2022 for dizziness, weakness  Echo at tjat time showed LVEF was severely reduced.  He went on to have a Land R heart cath   This showed no significant CAD and mild elevated LVEDP     Pt placed on medical Rx    [name] has been started .  In addtion a monitor showed 10.7% PVCs   Beause of palpitations he was placed on mexiletine 150 bid   The pt was initially seen at [facility]   Does not want to go back      Pt says he is rarely dizzy  No palpitatoins  He denies CP   No PND   Breathing is OK  overall Trying to do more walking      Current Meds  Medication Sig   aspirin EC 81 MG tablet Take 81 mg by mouth daily.   atorvastatin (LIPITOR) 10 MG tablet Take 10 mg by mouth at bedtime.   carvedilol (COREG) 6.25 MG tablet Take 1 tablet (6.25 mg total) by mouth 2 (two) times daily with a meal.   furosemide (LASIX) 20 MG tablet Take 20 mg by mouth 3 (three) times a week.   insulin glargine (LANTUS) 100 UNIT/ML injection Inject 30 Units into the skin daily.   metFORMIN (GLUCOPHAGE) 500 MG tablet Take 1,000 mg by mouth 2 (two) times daily with a meal.   mexiletine (MEXITIL) 150 MG capsule Take 1 capsule (150 mg total) by mouth 2 (two) times daily.   Multiple Vitamin (MULTIVITAMIN WITH MINERALS) TABS tablet Take 1 tablet by mouth every evening.    sacubitril-valsartan (ENTRESTO) 24-26 MG Take 1 tablet by mouth 2 (two) times daily.   spironolactone (ALDACTONE) 25 MG tablet Take 0.5 tablets (12.5 mg total) by mouth daily.     Allergies:   Patient has no known allergies.   Past Medical History:  Diagnosis Date    Arthritis    Diabetes mellitus without complication (HCC)    Hypertension    Lymphedema 09/09/2020   Wound drainage    right leg 3/4 inch area open wound with clear drainage last 2 days, covering with gauze prn    Past Surgical History:  Procedure Laterality Date   COLONOSCOPY  2014 ?   FLEXIBLE SIGMOIDOSCOPY N/A 08/27/2014   Procedure: FLEXIBLE SIGMOIDOSCOPY;  Surgeon: [name], MD;  Location: [facility] ENDOSCOPY;  Service: Endoscopy;  Laterality: N/A;   NO PAST SURGERIES     RIGHT/LEFT HEART CATH AND CORONARY ANGIOGRAPHY N/A 09/10/2020   Procedure: RIGHT/LEFT HEART CATH AND CORONARY ANGIOGRAPHY;  Surgeon: [name], MD;  Location: [facility] LAB;  Service: Cardiovascular;  Laterality: N/A;     Social History:  The patient  reports that he quit smoking about 8 months ago. His smoking use included cigarettes. He has a 63.00 pack-year smoking history. He has never used smokeless tobacco. He reports that he does not drink alcohol and does not use drugs.   Family History:  The patient's family history includes Heart attack in his father; Heart disease in  his mother.    ROS:  Please see the history of present illness. All other systems are reviewed and  Negative to the above problem except as noted.    PHYSICAL EXAM: VS:  BP 136/84   Pulse 79   Ht 6\' 1"  (1.854 m)   Wt (!) 418 lb 3.2 oz (189.7 kg)   SpO2 98%   BMI 55.17 kg/m   GEN: Morbidly obese 65 yo in no acute distress  HEENT: normal  Neck: no JVD, Cardiac: RRR; no murmurs, No gallops    LE  1 to 2 + edema   Respiratory:  clear to auscultation bilaterally, normal work of breathing GI: soft, No hepatomegaly   Distended   MS: no deformity Moving all extremities   Skin: warm and dry,  Chornic skine changes LE due to swelling    Sl excoriation R shin   Neuro:  Strength and sensation are intact Psych: euthymic mood, full affect   EKG:  EKG is not ordered today.  Echo:  09/09/20  1. Left ventricular ejection fraction,  by estimation, is 30%. The left ventricle has moderate to severely decreased function. The left ventricle demonstrates global hypokinesis. The left ventricular internal cavity size was moderately dilated. Left ventricular diastolic parameters are consistent with Grade I diastolic dysfunction (impaired relaxation). 2. Right ventricular systolic function was not well visualized. The right ventricular size is not well visualized. 3. The mitral valve is normal in structure. No evidence of mitral valve regurgitation. No evidence of mitral stenosis. 4. The aortic valve is tricuspid. Aortic valve regurgitation is not visualized. No aortic stenosis is present. 5. Aortic dilatation noted. There is mild dilatation of the aortic root, measuring 37 mm. 6. The inferior vena cava is dilated in size with >50% respiratory variability, suggesting right atrial pressure of 8 mmHg. 7. Technically difficult study with poor acoustic windows.  R and L heart cath   09/10/20  RA 12/11, mean 8 mmHg.  RA saturation 77%.  RV 37/5, EDP 10 mmHg.  PA 35/22, mean 28 mmHg.  PA saturation 81%.  PW 18/18, mean 16 mmHg.  Aortic saturation 98%. QT/QS 1.24 suggest insignificant left-to-right shunting.  May be at the right atrial level. Cardiac output 8.69, cardiac index 2.93 by Fick. LV 145/13, EDP 21 mmHg.  Aorta 112/25 with a mean of 91 mmHg. No pressure gradient across the aortic valve.   Angiographic data: LV: Dilated.  Global hypokinesis.  EF 30 to 35%.  EDP mildly elevated. Right dominant circulation.  No significant coronary artery disease with D1 being equivalent to ramus intermediate, large vessel.  All vessels are mild luminal irregularity.   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  He also has left to right shunt however may be clinically insignificant.  Shunt potentially could be at the level of  right atrium.   Recommendation: Therapy for nonischemic dilated cardiomyopathy.  He is well compensated.  Can be discharged at any point from cardiac standpoint.  Outpatient follow-up has been set up.   Monitor   10/16/20  Preliminary Findings Patient had a min HR of 53 bpm, max HR of 212 bpm, and avg HR of 80 bpm. Predominant underlying rhythm was Sinus Rhythm. Bundle Branch Block/IVCD was present. 2 Ventricular Tachycardia runs occurred, the run with the fastest interval lasting 4 beats with a max rate of 121 bpm (avg 101 bpm); the run with the fastest interval  was also the longest. 59 Supraventricular Tachycardia runs occurred, the run with the fastest interval lasting 5 beats with a max rate of 187 bpm, the longest lasting 17.5 secs with an avg rate of 115 bpm. Isolated SVEs were occasional (1.2%, 16980), SVE Couplets were rare (<1.0%, 439), and SVE Triplets were rare (<1.0%, 80). Isolated VEs were frequent (10.7%, 147557), VE Couplets were occasional (1.1%, 7880), and VE Triplets were rare (<1.0%, 74). Ventricular Bigeminy and Trigeminy were present.  Lipid Panel No results found for: CHOL, TRIG, HDL, CHOLHDL, VLDL, LDLCALC, LDLDIRECT    Wt Readings from Last 3 Encounters:  01/28/21 (!) 418 lb 3.2 oz (189.7 kg)  11/21/20 (!) 433 lb (196.4 kg)  10/01/20 (!) 435 lb (197.3 kg)      ASSESSMENT AND PLAN:  1  CHF  Pt with NICM   Echo above   Note cath showed small L to rR shunt at atrial leve.    He was placed on medical Rx at that time   Since seen the patient is doing OK   Tolerating regimen      I would recomm checking labs today    IF OK owuld titrate Entresto for better BP control     Plan for echo to reevaluate LVEF  2  HTN  BP is fair  Again, can be better  Check labs   Plan to titrate  3  Lipds    Will check lipids   No CAD  Goal to primary prevention  4  PVCs  Will get monitor to reassess burden on Mexilitene       5  Lymphedema   Has been seen in wound clinic    Plan for f/u in 4 to 6 wks    Current medicines are reviewed at length with the patient today.  The patient does not have concerns regarding medicines.  Signed, Dietrich Pates, MD  01/28/2021 8:58 AM    [facility] [address] [place], [place], [place]  [address] Phone: [phone]; Fax: [phone]

## 2021-01-28 ENCOUNTER — Other Ambulatory Visit: Payer: Self-pay

## 2021-01-28 ENCOUNTER — Encounter: Payer: Self-pay | Admitting: Internal Medicine

## 2021-01-28 ENCOUNTER — Ambulatory Visit (INDEPENDENT_AMBULATORY_CARE_PROVIDER_SITE_OTHER): Payer: PPO

## 2021-01-28 ENCOUNTER — Ambulatory Visit: Payer: PPO | Admitting: Internal Medicine

## 2021-01-28 VITALS — BP 136/84 | HR 79 | Ht 73.0 in | Wt >= 6400 oz

## 2021-01-28 DIAGNOSIS — I5022 Chronic systolic (congestive) heart failure: Secondary | ICD-10-CM

## 2021-01-28 DIAGNOSIS — I493 Ventricular premature depolarization: Secondary | ICD-10-CM

## 2021-01-28 NOTE — Patient Instructions (Addendum)
Medication Instructions:  No changes *If you need a refill on your cardiac medications before your next appointment, please call your pharmacy*   Lab Work: Today: bmet, cbc, bnp, vit d  If you have labs (blood work) drawn today and your tests are completely normal, you will receive your results only by: MyChart Message (if you have MyChart) OR A paper copy in the mail If you have any lab test that is abnormal or we need to change your treatment, we will call you to review the results.   Testing/Procedures: Your physician has requested that you have an echocardiogram. Echocardiography is a painless test that uses sound waves to create images of your heart. It provides your doctor with information about the size and shape of your heart and how well your heart's chambers and valves are working. This procedure takes approximately one hour. There are no restrictions for this procedure.  ZIO XT - 72 hours --see instructions below  Follow-Up: At [facility], you and your health needs are our priority.  As part of our continuing mission to provide you with exceptional heart care, we have created designated Provider Care Teams.  These Care Teams include your primary Cardiologist (physician) and Advanced Practice Providers (APPs -  Physician Assistants and Nurse Practitioners) who all work together to provide you with the care you need, when you need it.   Your next appointment:   4-6  week(s)  The format for your next appointment:   In Person  Provider:   You may see Dr. Tenny Craw or one of the following Advanced Practice Providers on your designated Care Team:   Tereso Newcomer, PA-C [place], [place]   Other Instructions Christena Deem- Long Term Monitor Instructions  Your physician has requested you wear a ZIO patch monitor for 3 days.  This is a single patch monitor. [employer] supplies one patch monitor per enrollment. Additional stickers are not available. Please do not apply patch if you will be  having a Nuclear Stress Test,  Echocardiogram, Cardiac CT, MRI, or Chest Xray during the period you would be wearing the  monitor. The patch cannot be worn during these tests. You cannot remove and re-apply the  ZIO XT patch monitor.  Your ZIO patch monitor will be mailed 3 day USPS to your address on file. It may take 3-5 days  to receive your monitor after you have been enrolled.  Once you have received your monitor, please review the enclosed instructions. Your monitor  has already been registered assigning a specific monitor serial # to you.  Billing and Patient Assistance Program Information  We have supplied [employer] with any of your insurance information on file for billing purposes. [employer] offers a sliding scale Patient Assistance Program for patients that do not have  insurance, or whose insurance does not completely cover the cost of the ZIO monitor.  You must apply for the Patient Assistance Program to qualify for this discounted rate.  To apply, please call [employer] at [phone], select option 4, select option 2, ask to apply for  Patient Assistance Program. Meredeth Ide will ask your household income, and how many people  are in your household. They will quote your out-of-pocket cost based on that information.  [employer] will also be able to set up a 72-month, interest-free payment plan if needed.  Applying the monitor   Shave hair from upper left chest.  Hold abrader disc by orange tab. Rub abrader in 40 strokes over the upper left chest as  indicated in your monitor instructions.  Clean area with 4 enclosed alcohol pads. Let dry.  Apply patch as indicated in monitor instructions. Patch will be placed under collarbone on left  side of chest with arrow pointing upward.  Rub patch adhesive wings for 2 minutes. Remove white label marked "1". Remove the white  label marked "2". Rub patch adhesive wings for 2 additional minutes.  While looking in a mirror, press and release button  in center of patch. A small green light will  flash 3-4 times. This will be your only indicator that the monitor has been turned on.  Do not shower for the first 24 hours. You may shower after the first 24 hours.  Press the button if you feel a symptom. You will hear a small click. Record Date, Time and  Symptom in the Patient Logbook.  When you are ready to remove the patch, follow instructions on the last 2 pages of Patient  Logbook. Stick patch monitor onto the last page of Patient Logbook.  Place Patient Logbook in the blue and white box. Use locking tab on box and tape box closed  securely. The blue and white box has prepaid postage on it. Please place it in the mailbox as  soon as possible. Your physician should have your test results approximately 7 days after the  monitor has been mailed back to [facility].  Call [facility] Customer Care at [phone] if you have questions regarding  your ZIO XT patch monitor. Call them immediately if you see an orange light blinking on your  monitor.  If your monitor falls off in less than 4 days, contact our Monitor department at [phone].  If your monitor becomes loose or falls off after 4 days call [employer] at [phone] for  suggestions on securing your monitor

## 2021-01-28 NOTE — Progress Notes (Unsigned)
Patient enrolled for [employer] to mail a 3 day ZIO XT to his address on file. 

## 2021-01-29 LAB — CBC
Hematocrit: 46.9 % (ref 37.5–51.0)
Hemoglobin: 15.8 g/dL (ref 13.0–17.7)
MCH: 28.3 pg (ref 26.6–33.0)
MCHC: 33.7 g/dL (ref 31.5–35.7)
MCV: 84 fL (ref 79–97)
Platelets: 182 10*3/uL (ref 150–450)
RBC: 5.59 x10E6/uL (ref 4.14–5.80)
RDW: 13 % (ref 11.6–15.4)
WBC: 6.2 10*3/uL (ref 3.4–10.8)

## 2021-01-29 LAB — LIPID PANEL
Chol/HDL Ratio: 3.1 ratio (ref 0.0–5.0)
Cholesterol, Total: 166 mg/dL (ref 100–199)
HDL: 54 mg/dL (ref >39)
LDL Chol Calc (NIH): 95 mg/dL (ref 0–99)
Triglycerides: 90 mg/dL (ref 0–149)
VLDL Cholesterol Cal: 17 mg/dL (ref 5–40)

## 2021-01-29 LAB — COMPREHENSIVE METABOLIC PANEL
ALT: 10 IU/L (ref 0–44)
AST: 11 IU/L (ref 0–40)
Albumin/Globulin Ratio: 1.6 (ref 1.2–2.2)
Albumin: 4.2 g/dL (ref 3.8–4.8)
Alkaline Phosphatase: 76 IU/L (ref 44–121)
BUN/Creatinine Ratio: 19 (ref 10–24)
BUN: 19 mg/dL (ref 8–27)
Bilirubin Total: 0.6 mg/dL (ref 0.0–1.2)
CO2: 20 mmol/L (ref 20–29)
Calcium: 9 mg/dL (ref 8.6–10.2)
Chloride: 101 mmol/L (ref 96–106)
Creatinine, Ser: 1 mg/dL (ref 0.76–1.27)
Globulin, Total: 2.6 g/dL (ref 1.5–4.5)
Glucose: 143 mg/dL — ABNORMAL HIGH (ref 65–99)
Potassium: 4.9 mmol/L (ref 3.5–5.2)
Sodium: 137 mmol/L (ref 134–144)
Total Protein: 6.8 g/dL (ref 6.0–8.5)
eGFR: 85 mL/min/{1.73_m2} (ref >59)

## 2021-01-29 LAB — VITAMIN D 25 HYDROXY (VIT D DEFICIENCY, FRACTURES): Vit D, 25-Hydroxy: 8.3 ng/mL — ABNORMAL LOW (ref 30.0–100.0)

## 2021-01-29 LAB — PRO B NATRIURETIC PEPTIDE: NT-Pro BNP: 197 pg/mL (ref 0–210)

## 2021-01-31 ENCOUNTER — Other Ambulatory Visit: Payer: Self-pay | Admitting: Internal Medicine

## 2021-02-03 ENCOUNTER — Telehealth: Payer: Self-pay

## 2021-02-03 DIAGNOSIS — I5022 Chronic systolic (congestive) heart failure: Secondary | ICD-10-CM

## 2021-02-03 DIAGNOSIS — Z79899 Other long term (current) drug therapy: Secondary | ICD-10-CM

## 2021-02-03 DIAGNOSIS — E785 Hyperlipidemia, unspecified: Secondary | ICD-10-CM

## 2021-02-03 DIAGNOSIS — I10 Essential (primary) hypertension: Secondary | ICD-10-CM

## 2021-02-03 MED ORDER — ATORVASTATIN CALCIUM 20 MG PO TABS: 20.0000 mg | ORAL_TABLET | Freq: Every day | ORAL | 3 refills | Status: DC

## 2021-02-03 NOTE — Telephone Encounter (Signed)
-----   Message from Dietrich Pates V, MD sent at 01/31/2021 12:29 AM EDT ----- CBC is normal    Fluid number is OK Liver function and kidney function are OK Lpiids should be lower   I would recimm increasing lipitor to 20 mg daily F/U lipids in 8 wks

## 2021-02-03 NOTE — Telephone Encounter (Signed)
Pt verbalized understanding of his lab results and agreed to the increased dose of the Lipitor and will come back the end of Oct 2022 for fasting labs.

## 2021-02-04 ENCOUNTER — Ambulatory Visit (HOSPITAL_COMMUNITY): Payer: PPO

## 2021-02-06 ENCOUNTER — Ambulatory Visit (HOSPITAL_COMMUNITY): Payer: PPO | Attending: Internal Medicine

## 2021-02-06 ENCOUNTER — Other Ambulatory Visit: Payer: Self-pay

## 2021-02-06 DIAGNOSIS — I493 Ventricular premature depolarization: Secondary | ICD-10-CM | POA: Insufficient documentation

## 2021-02-06 DIAGNOSIS — I5022 Chronic systolic (congestive) heart failure: Secondary | ICD-10-CM | POA: Diagnosis not present

## 2021-02-06 LAB — ECHOCARDIOGRAM COMPLETE
Area-P 1/2: 4.68 cm2
Calc EF: 48.1 %
S' Lateral: 5.1 cm
Single Plane A2C EF: 49.4 %
Single Plane A4C EF: 49.1 %

## 2021-02-13 DIAGNOSIS — I493 Ventricular premature depolarization: Secondary | ICD-10-CM | POA: Diagnosis not present

## 2021-02-13 DIAGNOSIS — I5022 Chronic systolic (congestive) heart failure: Secondary | ICD-10-CM | POA: Diagnosis not present

## 2021-03-03 ENCOUNTER — Other Ambulatory Visit: Payer: Self-pay

## 2021-03-03 ENCOUNTER — Ambulatory Visit (INDEPENDENT_AMBULATORY_CARE_PROVIDER_SITE_OTHER): Payer: PPO | Admitting: Internal Medicine

## 2021-03-03 ENCOUNTER — Encounter: Payer: Self-pay | Admitting: Internal Medicine

## 2021-03-03 VITALS — BP 110/70 | HR 79 | Ht 73.0 in | Wt >= 6400 oz

## 2021-03-03 DIAGNOSIS — I5043 Acute on chronic combined systolic (congestive) and diastolic (congestive) heart failure: Secondary | ICD-10-CM

## 2021-03-03 NOTE — Progress Notes (Signed)
Cardiology Office Note   Date:  03/03/2021   ID:  Matthew, [name] 1955-10-29, MRN [id number]  PCP:  [name], MD  Cardiologist:   Dietrich Pates, MD   Patient presents for f/u of CHF      History of Present Illness: [name] is a 65 y.o. male with a history of HTN, HL, DM, lymphedema, morbid obesity  Followed by Dr [name] The patient was admitted in March 2022 for dizziness, weakness  Echo at tjat time showed LVEF was severely reduced.  He went on to have a Land R heart cath   This showed no significant CAD and mild elevated LVEDP     Pt placed on medical Rx    [name] has been started .  In addtion a monitor showed 10.7% PVCs   Beause of palpitations he was placed on mexiletine 150 bid   The pt was initially seen at [facility]   Does not want to go back     I saw the pt in Aug   After the visit he had an echocardiogram done     I also recomm he go up on Entresto Since seen he did not make change in [place]   Worried he would run out  Breathing is unchanged   He denies CP     Current Meds  Medication Sig   aspirin EC 81 MG tablet Take 81 mg by mouth daily.   atorvastatin (LIPITOR) 40 MG tablet Take 40 mg by mouth daily. Take at bedtime by mouth once daily.   carvedilol (COREG) 6.25 MG tablet Take 1 tablet (6.25 mg total) by mouth 2 (two) times daily with a meal.   furosemide (LASIX) 20 MG tablet Take 20 mg by mouth 3 (three) times a week.   insulin glargine (LANTUS) 100 UNIT/ML injection Inject 30 Units into the skin daily.   metFORMIN (GLUCOPHAGE) 500 MG tablet Take 1,000 mg by mouth 2 (two) times daily with a meal.   mexiletine (MEXITIL) 150 MG capsule Take 1 capsule (150 mg total) by mouth 2 (two) times daily.   Multiple Vitamin (MULTIVITAMIN WITH MINERALS) TABS tablet Take 1 tablet by mouth every evening.    sacubitril-valsartan (ENTRESTO) 24-26 MG Take 1 tablet by mouth 2 (two) times daily.   spironolactone (ALDACTONE) 25 MG tablet Take 0.5 tablets (12.5 mg  total) by mouth daily.     Allergies:   Patient has no known allergies.   Past Medical History:  Diagnosis Date   Arthritis    Diabetes mellitus without complication (HCC)    Hypertension    Lymphedema 09/09/2020   Wound drainage    right leg 3/4 inch area open wound with clear drainage last 2 days, covering with gauze prn    Past Surgical History:  Procedure Laterality Date   COLONOSCOPY  2014 ?   FLEXIBLE SIGMOIDOSCOPY N/A 08/27/2014   Procedure: FLEXIBLE SIGMOIDOSCOPY;  Surgeon: [name], MD;  Location: [facility];  Service: Endoscopy;  Laterality: N/A;   NO PAST SURGERIES     RIGHT/LEFT HEART CATH AND CORONARY ANGIOGRAPHY N/A 09/10/2020   Procedure: RIGHT/LEFT HEART CATH AND CORONARY ANGIOGRAPHY;  Surgeon: [name], MD;  Location: [facility];  Service: Cardiovascular;  Laterality: N/A;     Social History:  The patient  reports that he quit smoking about 10 months ago. His smoking use included cigarettes. He has a 63.00 pack-year smoking history. He has never used smokeless tobacco. He reports that  he does not drink alcohol and does not use drugs.   Family History:  The patient's family history includes Heart attack in his father; Heart disease in his mother.    ROS:  Please see the history of present illness. All other systems are reviewed and  Negative to the above problem except as noted.    PHYSICAL EXAM: VS:  BP 110/70   Pulse 79   Ht 6\' 1"  (1.854 m)   Wt (!) 421 lb 3.2 oz (191.1 kg)   SpO2 96%   BMI 55.57 kg/m   GEN: Morbidly obese 65 yo in no acute distress  HEENT: normal  Neck: no JVD, Cardiac: RRR; no murmurs, No gallops    LE  1+ edema   Respiratory:  clear to auscultation bilaterally GI: soft, No hepatomegaly   Distended   MS: no deformity Moving all extremities   Skin: warm and dry,  Chornic skine changes LE due to swelling   Neuro:  Strength and sensation are intact Psych: euthymic mood, full affect   EKG:  EKG is not ordered  today.  Monitor  02/14/21  SR  Average HR 78 bpm   Occsaional PVCs  4% total    Echo   02/06/21  Left ventricular ejection fraction, by estimation, is 45 to 50%. The left ventricle has mildly decreased function. The left ventricle demonstrates global hypokinesis. The left ventricular internal cavity size was mildly dilated. Left ventricular diastolic parameters were normal. 1. Right ventricular systolic function is moderately reduced. The right ventricular size is normal. 2. 3. Right atrial size was mildly dilated. The mitral valve is normal in structure. No evidence of mitral valve regurgitation. No evidence of mitral stenosis. 4. 5. The aortic valve is normal in structure. Aortic valve regurgitation is not visualized.  Echo:  09/09/20  1. Left ventricular ejection fraction, by estimation, is 30%. The left ventricle has moderate to severely decreased function. The left ventricle demonstrates global hypokinesis. The left ventricular internal cavity size was moderately dilated. Left ventricular diastolic parameters are consistent with Grade I diastolic dysfunction (impaired relaxation). 2. Right ventricular systolic function was not well visualized. The right ventricular size is not well visualized. 3. The mitral valve is normal in structure. No evidence of mitral valve regurgitation. No evidence of mitral stenosis. 4. The aortic valve is tricuspid. Aortic valve regurgitation is not visualized. No aortic stenosis is present. 5. Aortic dilatation noted. There is mild dilatation of the aortic root, measuring 37 mm. 6. The inferior vena cava is dilated in size with >50% respiratory variability, suggesting right atrial pressure of 8 mmHg. 7. Technically difficult study with poor acoustic windows.  R and L heart cath   09/10/20  RA 12/11, mean 8 mmHg.  RA saturation 77%.  RV 37/5, EDP 10 mmHg.  PA 35/22, mean 28 mmHg.  PA saturation 81%.  PW 18/18, mean 16 mmHg.  Aortic saturation  98%. QT/QS 1.24 suggest insignificant left-to-right shunting.  May be at the right atrial level. Cardiac output 8.69, cardiac index 2.93 by Fick. LV 145/13, EDP 21 mmHg.  Aorta 112/25 with a mean of 91 mmHg. No pressure gradient across the aortic valve.   Angiographic data: LV: Dilated.  Global hypokinesis.  EF 30 to 35%.  EDP mildly elevated. Right dominant circulation.  No significant coronary artery disease with D1 being equivalent to ramus intermediate, large vessel.  All vessels are mild luminal irregularity.   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  He also has left to right shunt however may be clinically insignificant.  Shunt potentially could be at the level of right atrium.   Recommendation: Therapy for nonischemic dilated cardiomyopathy.  He is well compensated.  Can be discharged at any point from cardiac standpoint.  Outpatient follow-up has been set up.   Monitor   10/16/20  Preliminary Findings Patient had a min HR of 53 bpm, max HR of 212 bpm, and avg HR of 80 bpm. Predominant underlying rhythm was Sinus Rhythm. Bundle Branch Block/IVCD was present. 2 Ventricular Tachycardia runs occurred, the run with the fastest interval lasting 4 beats with a max rate of 121 bpm (avg 101 bpm); the run with the fastest interval was also the longest. 59 Supraventricular Tachycardia runs occurred, the run with the fastest interval lasting 5 beats with a max rate of 187 bpm, the longest lasting 17.5 secs with an avg rate of 115 bpm. Isolated SVEs were occasional (1.2%, 16980), SVE Couplets were rare (<1.0%, 439), and SVE Triplets were rare (<1.0%, 80). Isolated VEs were frequent (10.7%, 147557), VE Couplets were occasional (1.1%, 7880), and VE Triplets were rare (<1.0%, 74). Ventricular Bigeminy and Trigeminy were present.  Lipid Panel    Component Value Date/Time    CHOL 166 01/28/2021 0913   TRIG 90 01/28/2021 0913   HDL 54 01/28/2021 0913   CHOLHDL 3.1 01/28/2021 0913   LDLCALC 95 01/28/2021 0913      Wt Readings from Last 3 Encounters:  03/03/21 (!) 421 lb 3.2 oz (191.1 kg)  01/28/21 (!) 418 lb 3.2 oz (189.7 kg)  11/21/20 (!) 433 lb (196.4 kg)      ASSESSMENT AND PLAN:  1  CHF  Recent echo shows mild LV dysfunction    I would keep him on the same meds    I will give him some samples of the Entresto 49/51   he will try first before switching to make sure he tolerates it  2  HTN  BP is better today than on last visit   Follow at home    3  Lipds    LDL 95  HDL 54    No CAD  Goal to primary prevention  4  PVCs  Recent monitor showed 4% PVCs   Keep on Mexilitene       5  Lymphedema   Has been seen in wound clinic No change  Plan for f/u later this winter   Current medicines are reviewed at length with the patient today.  The patient does not have concerns regarding medicines.  Signed, Dietrich Pates, MD  03/03/2021 10:22 AM    [facility] [address] [place], [place], [place]  [address] Phone: [phone]; Fax: [phone]

## 2021-03-03 NOTE — Patient Instructions (Signed)
Medication Instructions:  PLEASE INCREASE ENTRESTO TO 49-51 MG - ONE TABLET TWICE DAILY  *If you need a refill on your cardiac medications before your next appointment, please call your pharmacy*   Lab Work: NONE If you have labs (blood work) drawn today and your tests are completely normal, you will receive your results only by: MyChart Message (if you have MyChart) OR A paper copy in the mail If you have any lab test that is abnormal or we need to change your treatment, we will call you to review the results.   Testing/Procedures: NONE   Follow-Up: PLEASE USE MYCHART OR CALL AND LET DR. ROSS KNOW IF YOU ARE TOLERATING THE INCREASED DOSE OF ENTRESTO.  IF YOU ARE, WE WILL HAVE THE DOSE CHANGED WITH THE PATIENT ASSISTANCE RX PROGRAM.   Other Instructions

## 2021-03-10 MED ORDER — ENTRESTO 49-51 MG PO TABS: 1.0000 | ORAL_TABLET | Freq: Two times a day (BID) | ORAL | 11 refills | Status: DC

## 2021-03-13 MED ORDER — ENTRESTO 49-51 MG PO TABS: 1.0000 | ORAL_TABLET | Freq: Two times a day (BID) | ORAL | 3 refills | Status: DC

## 2021-04-11 ENCOUNTER — Other Ambulatory Visit: Payer: PPO

## 2021-04-15 ENCOUNTER — Other Ambulatory Visit: Payer: Self-pay

## 2021-04-15 ENCOUNTER — Other Ambulatory Visit (INDEPENDENT_AMBULATORY_CARE_PROVIDER_SITE_OTHER): Payer: PPO | Admitting: *Deleted

## 2021-04-15 DIAGNOSIS — Z79899 Other long term (current) drug therapy: Secondary | ICD-10-CM | POA: Diagnosis not present

## 2021-04-15 DIAGNOSIS — E785 Hyperlipidemia, unspecified: Secondary | ICD-10-CM | POA: Diagnosis not present

## 2021-04-15 DIAGNOSIS — I5022 Chronic systolic (congestive) heart failure: Secondary | ICD-10-CM

## 2021-04-15 DIAGNOSIS — I10 Essential (primary) hypertension: Secondary | ICD-10-CM | POA: Diagnosis not present

## 2021-04-15 LAB — LIPID PANEL
Chol/HDL Ratio: 3 ratio (ref 0.0–5.0)
Cholesterol, Total: 158 mg/dL (ref 100–199)
HDL: 53 mg/dL (ref >39)
LDL Chol Calc (NIH): 88 mg/dL (ref 0–99)
Triglycerides: 89 mg/dL (ref 0–149)
VLDL Cholesterol Cal: 17 mg/dL (ref 5–40)

## 2021-05-15 ENCOUNTER — Telehealth: Payer: Self-pay

## 2021-05-15 NOTE — Telephone Encounter (Deleted)
**Note De-Identified Bengie Kaucher Obfuscation** The pt left his completed [employer] Pt Ass application for [place] with documents.  I have completed the providers page of his application and have emailed all to Dr Charlott Rakes nurse so she can print a Entresto prescription for #180 with 3 refills, obtain Dr Charlott Rakes signature on it and the application, date it, and to fax all to [employer] pt asst at the fax number written on the cover letter or to leave in the to be faxed basket in Medical Records to be faxed.

## 2021-05-15 NOTE — Telephone Encounter (Signed)
**Note De-Identified Jayjay Littles Obfuscation** The pt left his completed [employer] Pt Asst application for [place] with documents at the office.   I have completed the providers page of his application and have emailed all to Dr Charlott Rakes nurse so she can print a Entresto prescription for #180 with 3 refills, obtain Dr Charlott Rakes signature on it and the application, date it, and to fax all to [employer] pt asst at the fax number written on the cover letter or to leave in the to be faxed basket in Medical Records to be faxed.

## 2021-05-19 MED ORDER — ENTRESTO 49-51 MG PO TABS: 1.0000 | ORAL_TABLET | Freq: Two times a day (BID) | ORAL | 3 refills | Status: DC

## 2021-05-19 NOTE — Telephone Encounter (Signed)
RX printed for the pts Entresto and will have Dr. Tenny Craw sign and will fax forms at her next OV 05/21/21.

## 2021-05-21 ENCOUNTER — Other Ambulatory Visit: Payer: Self-pay

## 2021-05-21 MED ORDER — ENTRESTO 49-51 MG PO TABS: 1.0000 | ORAL_TABLET | Freq: Two times a day (BID) | ORAL | 3 refills | Status: DC

## 2021-05-21 MED ORDER — SACUBITRIL-VALSARTAN 49-51 MG PO TABS: 1.0000 | ORAL_TABLET | Freq: Two times a day (BID) | ORAL | 3 refills | Status: DC

## 2021-05-21 NOTE — Telephone Encounter (Signed)
Pt assistance forms and RX for [facility] faxed.

## 2021-06-30 NOTE — Telephone Encounter (Signed)
**Note De-Identified [name] Obfuscation** Approval letter from [employer] was sent to our CHF clinic and they sent message that the pt was approved to receive Entresto through PAP from [employer] through 06/14/22.  His patient ID is [id number].

## 2021-10-13 DIAGNOSIS — I10 Essential (primary) hypertension: Secondary | ICD-10-CM | POA: Diagnosis not present

## 2021-10-13 DIAGNOSIS — R0989 Other specified symptoms and signs involving the circulatory and respiratory systems: Secondary | ICD-10-CM | POA: Diagnosis not present

## 2021-10-13 DIAGNOSIS — Z1331 Encounter for screening for depression: Secondary | ICD-10-CM | POA: Diagnosis not present

## 2021-10-13 DIAGNOSIS — Z23 Encounter for immunization: Secondary | ICD-10-CM | POA: Diagnosis not present

## 2021-10-13 DIAGNOSIS — Z0000 Encounter for general adult medical examination without abnormal findings: Secondary | ICD-10-CM | POA: Diagnosis not present

## 2021-10-13 DIAGNOSIS — E1169 Type 2 diabetes mellitus with other specified complication: Secondary | ICD-10-CM | POA: Diagnosis not present

## 2021-10-13 DIAGNOSIS — I428 Other cardiomyopathies: Secondary | ICD-10-CM | POA: Diagnosis not present

## 2021-10-13 DIAGNOSIS — Z1211 Encounter for screening for malignant neoplasm of colon: Secondary | ICD-10-CM | POA: Diagnosis not present

## 2021-10-13 DIAGNOSIS — E7800 Pure hypercholesterolemia, unspecified: Secondary | ICD-10-CM | POA: Diagnosis not present

## 2021-10-13 DIAGNOSIS — Z794 Long term (current) use of insulin: Secondary | ICD-10-CM | POA: Diagnosis not present

## 2021-10-29 ENCOUNTER — Other Ambulatory Visit: Payer: Self-pay | Admitting: Internal Medicine

## 2021-10-29 DIAGNOSIS — R0989 Other specified symptoms and signs involving the circulatory and respiratory systems: Secondary | ICD-10-CM

## 2021-11-11 ENCOUNTER — Other Ambulatory Visit (HOSPITAL_COMMUNITY): Payer: Self-pay | Admitting: Internal Medicine

## 2021-11-11 DIAGNOSIS — R0989 Other specified symptoms and signs involving the circulatory and respiratory systems: Secondary | ICD-10-CM

## 2021-11-24 ENCOUNTER — Encounter: Payer: Self-pay | Admitting: Internal Medicine

## 2021-11-24 MED ORDER — MEXILETINE HCL 150 MG PO CAPS: 150.0000 mg | ORAL_CAPSULE | Freq: Two times a day (BID) | ORAL | 0 refills | Status: DC

## 2021-11-26 ENCOUNTER — Encounter (HOSPITAL_COMMUNITY): Payer: PPO

## 2021-11-26 ENCOUNTER — Inpatient Hospital Stay (HOSPITAL_COMMUNITY): Admission: RE | Admit: 2021-11-26 | Payer: PPO | Source: Ambulatory Visit

## 2021-11-26 NOTE — Telephone Encounter (Signed)
I spoke with the pt and advised him of the refill we sent in for him and I made him an appt next week with dr Tenny Craw.

## 2021-11-27 ENCOUNTER — Ambulatory Visit: Payer: PPO | Admitting: General Practice

## 2021-12-05 ENCOUNTER — Encounter: Payer: Self-pay | Admitting: Internal Medicine

## 2021-12-05 ENCOUNTER — Ambulatory Visit: Payer: PPO | Admitting: Internal Medicine

## 2021-12-05 VITALS — BP 168/90 | HR 76 | Resp 16 | Ht 73.0 in | Wt >= 6400 oz

## 2021-12-05 DIAGNOSIS — I5043 Acute on chronic combined systolic (congestive) and diastolic (congestive) heart failure: Secondary | ICD-10-CM | POA: Diagnosis not present

## 2021-12-05 DIAGNOSIS — I493 Ventricular premature depolarization: Secondary | ICD-10-CM | POA: Diagnosis not present

## 2021-12-05 MED ORDER — SACUBITRIL-VALSARTAN 49-51 MG PO TABS: 1.0000 | ORAL_TABLET | Freq: Two times a day (BID) | ORAL | 3 refills | Status: DC

## 2021-12-05 MED ORDER — SPIRONOLACTONE 25 MG PO TABS: 12.5000 mg | ORAL_TABLET | Freq: Every day | ORAL | 3 refills | Status: DC

## 2021-12-05 MED ORDER — CARVEDILOL 6.25 MG PO TABS: 6.2500 mg | ORAL_TABLET | Freq: Two times a day (BID) | ORAL | 1 refills | Status: DC

## 2021-12-05 MED ORDER — MEXILETINE HCL 150 MG PO CAPS: 150.0000 mg | ORAL_CAPSULE | Freq: Two times a day (BID) | ORAL | 3 refills | Status: DC

## 2021-12-05 MED ORDER — ATORVASTATIN CALCIUM 40 MG PO TABS: 40.0000 mg | ORAL_TABLET | Freq: Every day | ORAL | 3 refills | Status: DC

## 2021-12-05 MED ORDER — FUROSEMIDE 20 MG PO TABS: 20.0000 mg | ORAL_TABLET | ORAL | 3 refills | Status: DC

## 2021-12-17 ENCOUNTER — Ambulatory Visit: Payer: PPO | Admitting: General Practice

## 2021-12-19 ENCOUNTER — Ambulatory Visit (HOSPITAL_COMMUNITY)
Admission: RE | Admit: 2021-12-19 | Payer: PPO | Source: Ambulatory Visit | Attending: Internal Medicine | Admitting: Internal Medicine

## 2021-12-19 ENCOUNTER — Ambulatory Visit (HOSPITAL_COMMUNITY): Payer: PPO

## 2022-01-27 ENCOUNTER — Telehealth: Payer: Self-pay | Admitting: Internal Medicine

## 2022-01-27 MED ORDER — FUROSEMIDE 20 MG PO TABS: 20.0000 mg | ORAL_TABLET | ORAL | 3 refills | Status: DC

## 2022-01-27 NOTE — Telephone Encounter (Signed)
Pt c/o medication issue:  1. Name of Medication: furosemide (LASIX) 20 MG tablet  2. How are you currently taking this medication (dosage and times per day)?  Take 1 tablet (20 mg total) by mouth 3 (three) times a week.  3. Are you having a reaction (difficulty breathing--STAT)?   4. What is your medication issue? Pharmacy just wants to clarify change of taking it 3 times a week, as they have twice a week. Please advise.

## 2022-01-27 NOTE — Telephone Encounter (Signed)
Resent prescription advising lasix IS 3 times per week.  Await further needs.

## 2022-01-28 ENCOUNTER — Telehealth: Payer: Self-pay | Admitting: Internal Medicine

## 2022-01-28 NOTE — Telephone Encounter (Signed)
Error

## 2022-01-28 NOTE — Telephone Encounter (Signed)
Called [facility] back and spoke with the pharmacy team there.  They were just wanting to clarify that pt is taking lipitor 40 mg po daily by our records, for they have an old prescription from PCP back in May for 20 mg.   Endorsed to the pharmacy team that pt saw Dr. Tenny Craw at the end of June and no med changes to his lipitor was made and he was to continue taking lipitor 40 mg po daily.  They are aware our records show lipitor 40 mg po daily, and this was refilled at his last OV with Dr. Tenny Craw on 6/23.  [facility] verbalized understanding and agrees with this plan and will refill this medication for the pt accordingly.

## 2022-01-28 NOTE — Telephone Encounter (Signed)
Pt c/o medication issue:  1. Name of Medication:   atorvastatin (LIPITOR) 40 MG tablet    2. How are you currently taking this medication (dosage and times per day)? Take 1 tablet (40 mg total) by mouth daily. Take at bedtime by mouth once daily  3. Are you having a reaction (difficulty breathing--STAT)? No  4. What is your medication issue? Pharmacy states that they received a prescription on 5/18 from another provider for Lipitor 20 MG. They would like to know if Dr. Tenny Craw wants to increase medication to 40 MG. Please advise

## 2022-04-28 DIAGNOSIS — Z1211 Encounter for screening for malignant neoplasm of colon: Secondary | ICD-10-CM | POA: Diagnosis not present

## 2022-04-28 DIAGNOSIS — Z5181 Encounter for therapeutic drug level monitoring: Secondary | ICD-10-CM | POA: Diagnosis not present

## 2022-04-28 DIAGNOSIS — I428 Other cardiomyopathies: Secondary | ICD-10-CM | POA: Diagnosis not present

## 2022-04-28 DIAGNOSIS — Z23 Encounter for immunization: Secondary | ICD-10-CM | POA: Diagnosis not present

## 2022-04-28 DIAGNOSIS — R0989 Other specified symptoms and signs involving the circulatory and respiratory systems: Secondary | ICD-10-CM | POA: Diagnosis not present

## 2022-04-28 DIAGNOSIS — I872 Venous insufficiency (chronic) (peripheral): Secondary | ICD-10-CM | POA: Diagnosis not present

## 2022-04-28 DIAGNOSIS — E7800 Pure hypercholesterolemia, unspecified: Secondary | ICD-10-CM | POA: Diagnosis not present

## 2022-04-28 DIAGNOSIS — Z794 Long term (current) use of insulin: Secondary | ICD-10-CM | POA: Diagnosis not present

## 2022-04-28 DIAGNOSIS — Z79899 Other long term (current) drug therapy: Secondary | ICD-10-CM | POA: Diagnosis not present

## 2022-04-28 DIAGNOSIS — I5042 Chronic combined systolic (congestive) and diastolic (congestive) heart failure: Secondary | ICD-10-CM | POA: Diagnosis not present

## 2022-04-28 DIAGNOSIS — E1169 Type 2 diabetes mellitus with other specified complication: Secondary | ICD-10-CM | POA: Diagnosis not present

## 2022-04-28 DIAGNOSIS — I10 Essential (primary) hypertension: Secondary | ICD-10-CM | POA: Diagnosis not present

## 2022-04-29 ENCOUNTER — Telehealth: Payer: Self-pay

## 2022-04-29 ENCOUNTER — Encounter: Payer: Self-pay | Admitting: Internal Medicine

## 2022-04-29 NOTE — Telephone Encounter (Signed)
**Note De-Identified [name] Obfuscation** I called the pt to discuss his [facility] message concerning him leaving his completed [employer] pt assistance application for Entresto at the office while at his f/u on 12/13. Because I will be out of the office on 12/13, I called him to discuss and he states that he wants to bring it to the office to drop off on Monday 11/20 so I can assist with it and so his application will be faxed to [facility] sooner.  He thanked me for calling him to discuss.

## 2022-05-01 ENCOUNTER — Other Ambulatory Visit (HOSPITAL_COMMUNITY): Payer: Self-pay

## 2022-05-01 ENCOUNTER — Telehealth (HOSPITAL_COMMUNITY): Payer: Self-pay

## 2022-05-01 NOTE — Telephone Encounter (Signed)
Advanced Heart Failure Patient Advocate Encounter  Received renewal notification for [employer]). Renewal not submitted at this time.  The patient was approved for a [employer] grant that will help cover the cost of Entresto.  Total amount awarded, $10,000.  Effective: 04/01/2022 - 04/01/2023.  BIN [id number] PCN [id number] Group [id number] ID [id number]  New prescription(s) being sent to [facility]. Patient provided with approval and processing information via email.  Burnell Blanks, CPhT Rx Patient Advocate Phone: [phone]

## 2022-05-14 DIAGNOSIS — E7800 Pure hypercholesterolemia, unspecified: Secondary | ICD-10-CM | POA: Diagnosis not present

## 2022-05-14 DIAGNOSIS — I509 Heart failure, unspecified: Secondary | ICD-10-CM | POA: Diagnosis not present

## 2022-05-14 DIAGNOSIS — E1169 Type 2 diabetes mellitus with other specified complication: Secondary | ICD-10-CM | POA: Diagnosis not present

## 2022-05-14 DIAGNOSIS — I10 Essential (primary) hypertension: Secondary | ICD-10-CM | POA: Diagnosis not present

## 2022-05-18 ENCOUNTER — Other Ambulatory Visit: Payer: Self-pay

## 2022-05-27 ENCOUNTER — Ambulatory Visit: Payer: PPO | Admitting: Internal Medicine

## 2022-06-01 ENCOUNTER — Other Ambulatory Visit: Payer: Self-pay

## 2022-06-01 MED ORDER — SACUBITRIL-VALSARTAN 49-51 MG PO TABS: 1.0000 | ORAL_TABLET | Freq: Two times a day (BID) | ORAL | 1 refills | Status: DC

## 2022-06-21 NOTE — Progress Notes (Deleted)
**Note Matthew-Identified via Obfuscation** Cardiology Office Note   Date:  06/21/2022   ID:  Matthew, [name] 17-Oct-1955, MRN [id number]  PCP:  Matthew Carol, MD  Cardiologist:   Matthew Carnes, MD   Patient presents for f/u of CHF      History of Present Illness: [name] is a 67 y.o. male with a history of HTN, HL, DM, lymphedema, morbid obesity  Followed by Dr Delfina Redwood The patient was admitted in March 2022 for dizziness, weakness  Echo at tjat time showed LVEF was severely reduced.  He went on to have a Land R heart cath   This showed no significant CAD and mild elevated LVEDP     Pt placed on medical Rx    Delene [name] has been started .  In addtion a monitor showed 10.7% PVCs   Beause of palpitations he was placed on mexiletine 150 bid   The pt was initially seen at [facility]   Does not want to go back     I saw the pt in clinic in June 2023        No outpatient medications have been marked as taking for the 06/22/22 encounter (Appointment) with [name], MD.     Allergies:   Patient has no known allergies.   Past Medical History:  Diagnosis Date   Arthritis    Diabetes mellitus without complication ([name])    Hypertension    Lymphedema 09/09/2020   Wound drainage    right leg 3/4 inch area open wound with clear drainage last 2 days, covering with gauze prn    Past Surgical History:  Procedure Laterality Date   COLONOSCOPY  2014 ?   FLEXIBLE SIGMOIDOSCOPY N/A 08/27/2014   Procedure: FLEXIBLE SIGMOIDOSCOPY;  Surgeon: [name], MD;  Location: [facility];  Service: Endoscopy;  Laterality: N/A;   NO PAST SURGERIES     RIGHT/LEFT HEART CATH AND CORONARY ANGIOGRAPHY N/A 09/10/2020   Procedure: RIGHT/LEFT HEART CATH AND CORONARY ANGIOGRAPHY;  Surgeon: [name], MD;  Location: [facility];  Service: Cardiovascular;  Laterality: N/A;     Social History:  The patient  reports that he quit smoking about 2 years ago. His smoking use included cigarettes. He has a 63.00 pack-year smoking  history. He has never used smokeless tobacco. He reports that he does not drink alcohol and does not use drugs.   Family History:  The patient's family history includes Heart attack in his father; Heart disease in his mother.    ROS:  Please see the history of present illness. All other systems are reviewed and  Negative to the above problem except as noted.    PHYSICAL EXAM: VS:  There were no vitals taken for this visit.  GEN: Morbidly obese 67 yo in no acute distress   Examined in chair HEENT: normal  Neck: no JVD, Cardiac: RRR; no murmurs, No gallops    LE  1+ edema  soft    Respiratory:  clear to auscultation bilaterally GI: soft, No hepatomegaly   Distended   MS: no deformity Moving all extremities   Skin: warm and dry,  Chornic skin changes legs  Neuro:  Strength and sensation are intact Psych: euthymic mood, full affect   EKG:  EKG is ordered today  SR 76 bpm   First degree AVB   Occasional PAC, PVC.   RBBB    Monitor  02/14/21  SR  Average HR 78 bpm   Occsaional PVCs  4% total  Echo   02/06/21  Left ventricular ejection fraction, by estimation, is 45 to 50%. The left ventricle has mildly decreased function. The left ventricle demonstrates global hypokinesis. The left ventricular internal cavity size was mildly dilated. Left ventricular diastolic parameters were normal. 1. Right ventricular systolic function is moderately reduced. The right ventricular size is normal. 2. 3. Right atrial size was mildly dilated. The mitral valve is normal in structure. No evidence of mitral valve regurgitation. No evidence of mitral stenosis. 4. 5. The aortic valve is normal in structure. Aortic valve regurgitation is not visualized.  Echo:  09/09/20  1. Left ventricular ejection fraction, by estimation, is 30%. The left ventricle has moderate to severely decreased function. The left ventricle demonstrates global hypokinesis. The left ventricular internal cavity size was  moderately dilated. Left ventricular diastolic parameters are consistent with Grade I diastolic dysfunction (impaired relaxation). 2. Right ventricular systolic function was not well visualized. The right ventricular size is not well visualized. 3. The mitral valve is normal in structure. No evidence of mitral valve regurgitation. No evidence of mitral stenosis. 4. The aortic valve is tricuspid. Aortic valve regurgitation is not visualized. No aortic stenosis is present. 5. Aortic dilatation noted. There is mild dilatation of the aortic root, measuring 37 mm. 6. The inferior vena cava is dilated in size with >50% respiratory variability, suggesting right atrial pressure of 8 mmHg. 7. Technically difficult study with poor acoustic windows.  R and L heart cath   09/10/20  RA 12/11, mean 8 mmHg.  RA saturation 77%.  RV 37/5, EDP 10 mmHg.  PA 35/22, mean 28 mmHg.  PA saturation 81%.  PW 18/18, mean 16 mmHg.  Aortic saturation 98%. QT/QS 1.24 suggest insignificant left-to-right shunting.  May be at the right atrial level. Cardiac output 8.69, cardiac index 2.93 by Fick. LV 145/13, EDP 21 mmHg.  Aorta 112/25 with a mean of 91 mmHg. No pressure gradient across the aortic valve.   Angiographic data: LV: Dilated.  Global hypokinesis.  EF 30 to 35%.  EDP mildly elevated. Right dominant circulation.  No significant coronary artery disease with D1 being equivalent to ramus intermediate, large vessel.  All vessels are mild luminal irregularity.   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  He also has left to right shunt however may be clinically insignificant.  Shunt potentially could be at the level of right atrium.   Recommendation: Therapy for nonischemic dilated cardiomyopathy.  He is well compensated.  Can be discharged at any point from cardiac standpoint.  Outpatient follow-up  has been set up.   Monitor   10/16/20  Preliminary Findings Patient had a min HR of 53 bpm, max HR of 212 bpm, and avg HR of 80 bpm. Predominant underlying rhythm was Sinus Rhythm. Bundle Branch Block/IVCD was present. 2 Ventricular Tachycardia runs occurred, the run with the fastest interval lasting 4 beats with a max rate of 121 bpm (avg 101 bpm); the run with the fastest interval was also the longest. 59 Supraventricular Tachycardia runs occurred, the run with the fastest interval lasting 5 beats with a max rate of 187 bpm, the longest lasting 17.5 secs with an avg rate of 115 bpm. Isolated SVEs were occasional (1.2%, 16980), SVE Couplets were rare (<1.0%, 439), and SVE Triplets were rare (<1.0%, 80). Isolated VEs were frequent (10.7%, 147557), VE Couplets were occasional (1.1%, 7880), and VE Triplets were rare (<1.0%, 74).  Ventricular Bigeminy and Trigeminy were present.  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  The patient does not have concerns regarding medicines.  Signed, Dietrich Pates, MD  06/21/2022 11:10 PM    [facility] [address] [place],  [place], [place]  [address] Phone: [phone]; Fax: [phone]

## 2022-06-22 ENCOUNTER — Ambulatory Visit: Payer: PPO | Admitting: Internal Medicine

## 2022-07-12 NOTE — Progress Notes (Unsigned)
Cardiology Office Note   Date:  07/13/2022   ID:  [name], [name] 27-Feb-1956, MRN [id number]  PCP:  Matthew Dills, MD  Cardiologist:   Matthew Pates, MD   Patient presents for f/u of CHF      History of Present Illness: [name] is a 67 y.o. male with a history of HTN, HL, DM, lymphedema, morbid obesity  Followed by Dr [name] The patient was admitted in March 2022 for dizziness, weakness  Echo at tjat time showed LVEF was severely reduced.  He went on to have a Land R heart cath   This showed no significant CAD and mild elevated LVEDP     Pt placed on medical Rx    [name] has been started .  In addtion a monitor showed 10.7% PVCs   Beause of palpitations he was placed on mexiletine 150 bid   The pt was initially seen at [facility]   Does not want to go back     Monitor in Sept 2022 showed SR   4% PVCs    I saw the pt in June 2023  Since seen he says his  breathing is OK   He denies dizziness  No CP     LEgs are bad   Going to wound care this Thursday Dr POlite is getting grant funding for [employer] Had 2 deaths in family this winter   still stressed     Current Meds  Medication Sig   aspirin EC 81 MG tablet Take 81 mg by mouth daily.   atorvastatin (LIPITOR) 40 MG tablet Take 1 tablet (40 mg total) by mouth daily. Take at bedtime by mouth once daily.   carvedilol (COREG) 6.25 MG tablet Take 1 tablet (6.25 mg total) by mouth 2 (two) times daily with a meal.   furosemide (LASIX) 20 MG tablet Take 1 tablet (20 mg total) by mouth 3 (three) times a week.   insulin glargine (LANTUS) 100 UNIT/ML injection Inject 30 Units into the skin daily.   metFORMIN (GLUCOPHAGE) 500 MG tablet Take 1,000 mg by mouth 2 (two) times daily with a meal.   mexiletine (MEXITIL) 150 MG capsule Take 1 capsule (150 mg total) by mouth 2 (two) times daily.   Multiple Vitamin (MULTIVITAMIN WITH MINERALS) TABS tablet Take 1 tablet by mouth every evening.    sacubitril-valsartan (ENTRESTO) 49-51 MG  Take 1 tablet by mouth 2 (two) times daily.   spironolactone (ALDACTONE) 25 MG tablet Take 0.5 tablets (12.5 mg total) by mouth daily.     Allergies:   Patient has no known allergies.   Past Medical History:  Diagnosis Date   Arthritis    Diabetes mellitus without complication ([facility])    Hypertension    Lymphedema 09/09/2020   Wound drainage    right leg 3/4 inch area open wound with clear drainage last 2 days, covering with gauze prn    Past Surgical History:  Procedure Laterality Date   COLONOSCOPY  2014 ?   FLEXIBLE SIGMOIDOSCOPY N/A 08/27/2014   Procedure: FLEXIBLE SIGMOIDOSCOPY;  Surgeon: Matthew Bumpers, MD;  Location[facility];  Service: Endoscopy;  Laterality: N/A;   NO PAST SURGERIES     RIGHT/LEFT HEART CATH AND CORONARY ANGIOGRAPHY N/A 09/10/2020   Procedure: RIGHT/LEFT HEART CATH AND CORONARY ANGIOGRAPHY;  Surgeon: Matthew Decamp, MD;  Location: [facility];  Service: Cardiovascular;  Laterality: N/A;     Social History:  The patient  reports that he quit smoking  about 2 years ago. His smoking use included cigarettes. He has a 63.00 pack-year smoking history. He has never used smokeless tobacco. He reports that he does not drink alcohol and does not use drugs.   Family History:  The patient's family history includes Heart attack in his father; Heart disease in his mother.    ROS:  Please see the history of present illness. All other systems are reviewed and  Negative to the above problem except as noted.    PHYSICAL EXAM: VS:  BP 128/70   Pulse 72   Wt (!) 358 lb (162.4 kg)   BMI 47.23 kg/m   GEN: Morbidly obese 66 yo in no acute distress   Examined in chair HEENT: normal  Neck: no JVD, Cardiac: RRR; no murmurs, No gallops    LE  12+ edema  Wrapped  SOme oozing    Respiratory:  clear to auscultation bilaterally GI: soft, No hepatomegaly   Distended   MS: no deformity Moving all extremities   Skin: warm and dry,  Chornic skin changes legs  Neuro:   Strength and sensation are intact Psych: euthymic mood, full affect   EKG:  EKG is ordered today  SR 72 bpm  First degree AV block  PR 208 msec   RBBB\\  Monitor  02/14/21 SR  Average HR 78 bpm   Occsaional PVCs  4% total    Echo   02/06/21  Left ventricular ejection fraction, by estimation, is 45 to 50%. The left ventricle has mildly decreased function. The left ventricle demonstrates global hypokinesis. The left ventricular internal cavity size was mildly dilated. Left ventricular diastolic parameters were normal. 1. Right ventricular systolic function is moderately reduced. The right ventricular size is normal. 2. 3. Right atrial size was mildly dilated. The mitral valve is normal in structure. No evidence of mitral valve regurgitation. No evidence of mitral stenosis. 4. 5. The aortic valve is normal in structure. Aortic valve regurgitation is not visualized.  Echo:  09/09/20  1. Left ventricular ejection fraction, by estimation, is 30%. The left ventricle has moderate to severely decreased function. The left ventricle demonstrates global hypokinesis. The left ventricular internal cavity size was moderately dilated. Left ventricular diastolic parameters are consistent with Grade I diastolic dysfunction (impaired relaxation). 2. Right ventricular systolic function was not well visualized. The right ventricular size is not well visualized. 3. The mitral valve is normal in structure. No evidence of mitral valve regurgitation. No evidence of mitral stenosis. 4. The aortic valve is tricuspid. Aortic valve regurgitation is not visualized. No aortic stenosis is present. 5. Aortic dilatation noted. There is mild dilatation of the aortic root, measuring 37 mm. 6. The inferior vena cava is dilated in size with >50% respiratory variability, suggesting right atrial pressure of 8 mmHg. 7. Technically difficult study with poor acoustic windows.  R and L heart cath   09/10/20  RA 12/11,  mean 8 mmHg.  RA saturation 77%.  RV 37/5, EDP 10 mmHg.  PA 35/22, mean 28 mmHg.  PA saturation 81%.  PW 18/18, mean 16 mmHg.  Aortic saturation 98%. QT/QS 1.24 suggest insignificant left-to-right shunting.  May be at the right atrial level. Cardiac output 8.69, cardiac index 2.93 by Fick. LV 145/13, EDP 21 mmHg.  Aorta 112/25 with a mean of 91 mmHg. No pressure gradient across the aortic valve.   Angiographic data: LV: Dilated.  Global hypokinesis.  EF 30 to 35%.  EDP mildly elevated. Right dominant circulation.  No significant coronary  artery disease with D1 being equivalent to ramus intermediate, large vessel.  All vessels are mild luminal irregularity.   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  He also has left to right shunt however may be clinically insignificant.  Shunt potentially could be at the level of right atrium.   Monitor   10/16/20  Preliminary Findings Patient had a min HR of 53 bpm, max HR of 212 bpm, and avg HR of 80 bpm. Predominant underlying rhythm was Sinus Rhythm. Bundle Branch Block/IVCD was present. 2 Ventricular Tachycardia runs occurred, the run with the fastest interval lasting 4 beats with a max rate of 121 bpm (avg 101 bpm); the run with the fastest interval was also the longest. 59 Supraventricular Tachycardia runs occurred, the run with the fastest interval lasting 5 beats with a max rate of 187 bpm, the longest lasting 17.5 secs with an avg rate of 115 bpm. Isolated SVEs were occasional (1.2%, 16980), SVE Couplets were rare (<1.0%, 439), and SVE Triplets were rare (<1.0%, 80). Isolated VEs were frequent (10.7%, 147557), VE Couplets were occasional (1.1%, 7880), and VE Triplets were rare (<1.0%, 74). Ventricular Bigeminy and Trigeminy were present.  Lipid Panel    Component Value Date/Time   CHOL 158 04/15/2021 0917   TRIG 89  04/15/2021 0917   HDL 53 04/15/2021 0917   CHOLHDL 3.0 04/15/2021 0917   LDLCALC 88 04/15/2021 0917      Wt Readings from Last 3 Encounters:  07/13/22 (!) 358 lb (162.4 kg)  12/05/21 (!) 409 lb 9.6 oz (185.8 kg)  03/03/21 (!) 421 lb 3.2 oz (191.1 kg)      ASSESSMENT AND PLAN:  1  HFmrEF   LVEF 45 to 50%   Comfortable  Keep on current meds      2  HTN  BP is ok  Continue current meds   3  Lipds    LDL 72  HDL 54  Trig 80  Keep on lipitor     4  PVCs Last monitor showed 4% PVCs  Keep on Mexilitene        5  Lymphedema  Going to wound care   6 DM   Follows with Dr Delfina Redwood  Plan to start [place] soon   Plan for f/u in late summer   Current medicines are reviewed at length with the patient today.  The patient does not have concerns regarding medicines.  Signed, Dorris Carnes, MD  07/13/2022 3:02 PM    [facility] [place], [place], [place]  [address] Phone: [phone]; Fax: [phone]

## 2022-07-13 ENCOUNTER — Ambulatory Visit: Payer: PPO | Attending: Internal Medicine | Admitting: Internal Medicine

## 2022-07-13 ENCOUNTER — Encounter: Payer: Self-pay | Admitting: Internal Medicine

## 2022-07-13 VITALS — BP 128/70 | HR 72 | Wt 358.0 lb

## 2022-07-13 DIAGNOSIS — I10 Essential (primary) hypertension: Secondary | ICD-10-CM

## 2022-07-13 DIAGNOSIS — I5043 Acute on chronic combined systolic (congestive) and diastolic (congestive) heart failure: Secondary | ICD-10-CM

## 2022-07-13 NOTE — Patient Instructions (Signed)
Medication Instructions:   *If you need a refill on your cardiac medications before your next appointment, please call your pharmacy*   Lab Work:  If you have labs (blood work) drawn today and your tests are completely normal, you will receive your results only by: [place] (if you have MyChart) OR A paper copy in the mail If you have any lab test that is abnormal or we need to change your treatment, we will call you to review the results.   Testing/Procedures:    Follow-Up: At [facility], you and your health needs are our priority.  As part of our continuing mission to provide you with exceptional heart care, we have created designated Provider Care Teams.  These Care Teams include your primary Cardiologist (physician) and Advanced Practice Providers (APPs -  Physician Assistants and Nurse Practitioners) who all work together to provide you with the care you need, when you need it.  We recommend signing up for the patient portal called "MyChart".  Sign up information is provided on this After Visit Summary.  MyChart is used to connect with patients for Virtual Visits (Telemedicine).  Patients are able to view lab/test results, encounter notes, upcoming appointments, etc.  Non-urgent messages can be sent to your provider as well.   To learn more about what you can do with MyChart, go to [email or web address].    Your next appointment:   6 month(s)  Provider:   Dr Dorris Carnes      Other Instructions

## 2022-07-16 ENCOUNTER — Encounter: Payer: PPO | Attending: Physician Assistant | Admitting: Physician Assistant

## 2022-07-16 ENCOUNTER — Other Ambulatory Visit: Payer: Self-pay

## 2022-07-16 DIAGNOSIS — I110 Hypertensive heart disease with heart failure: Secondary | ICD-10-CM | POA: Diagnosis not present

## 2022-07-16 DIAGNOSIS — I890 Lymphedema, not elsewhere classified: Secondary | ICD-10-CM | POA: Insufficient documentation

## 2022-07-16 DIAGNOSIS — E11622 Type 2 diabetes mellitus with other skin ulcer: Secondary | ICD-10-CM | POA: Insufficient documentation

## 2022-07-16 DIAGNOSIS — L97812 Non-pressure chronic ulcer of other part of right lower leg with fat layer exposed: Secondary | ICD-10-CM | POA: Insufficient documentation

## 2022-07-16 DIAGNOSIS — I5042 Chronic combined systolic (congestive) and diastolic (congestive) heart failure: Secondary | ICD-10-CM | POA: Insufficient documentation

## 2022-07-16 DIAGNOSIS — L97822 Non-pressure chronic ulcer of other part of left lower leg with fat layer exposed: Secondary | ICD-10-CM | POA: Diagnosis not present

## 2022-07-16 MED ORDER — CARVEDILOL 6.25 MG PO TABS: 6.2500 mg | ORAL_TABLET | Freq: Two times a day (BID) | ORAL | 3 refills | Status: DC

## 2022-07-16 NOTE — Progress Notes (Signed)
BARNETT, ELZEY ([id number]) [id number] Page 1 of 5 Visit Report for 07/16/2022 Abuse Risk Screen Details Patient Name: Date of Service: [name] 07/16/2022 8:45 A M Medical Record Number: [id number] Patient Account Number: [id number] Date of Birth/Sex: Treating RN: [birth year], [name] (66 y.o. [name]) [name] Primary Care Rawley Harju: [name] Other Clinician: Referring Clotile Whittington: Treating Foxx Klarich/Extender: [name] in Treatment: 0 Abuse Risk Screen Items Answer ABUSE RISK SCREEN: Has anyone close to you tried to hurt or harm you recentlyo No Do you feel uncomfortable with anyone in your familyo No Has anyone forced you do things that you didnt want to doo No Electronic Signature(s) Signed: 07/16/2022 4:39:28 PM By: [name] RN Entered By: [name] on 07/16/2022 09:15:44 -------------------------------------------------------------------------------- Activities of Daily Living Details Patient Name: Date of Service: [name] 07/16/2022 8:45 A M Medical Record Number: [id number] Patient Account Number: [id number] Date of Birth/Sex: Treating RN: [name] (66 y.o. [name]) [name] Primary Care Kamen Hanken: [name] Other Clinician: Referring Margurete Guaman: Treating Sophiea Ueda/Extender: [name] in Treatment: 0 Activities of Daily Living Items Answer Activities of Daily Living (Please select one for each item) Drive Automobile Completely Able T Medications ake Completely Able Use T elephone Completely Able Care for Appearance Completely Able Use T oilet Completely Able Bath / Shower Completely Able Dress Self Completely Able Feed Self Completely Able Walk Completely Able Get In / Out Bed Completely Able Housework Completely [name], [name] ([id number]) [phone] [id number] Page 2 of 5 Prepare Meals Completely Able Handle Money Completely Able Shop  for Self Completely Able Electronic Signature(s) Signed: 07/16/2022 4:39:28 PM By: [name] RN Entered By: [name] on 07/16/2022 09:16:07 -------------------------------------------------------------------------------- Education Screening Details Patient Name: Date of Service: Matthew Broom [name] 07/16/2022 8:45 A M Medical Record Number: [id number] Patient Account Number: [id number] Date of Birth/Sex: Treating RN: [birth year] (66 y.o. [name]) [name] Primary Care Mishti Swanton: [name] Other Clinician: Referring Calina Patrie: Treating Mahlik Lenn/Extender: [name] in Treatment: 0 Primary Learner Assessed: Patient Learning Preferences/Education Level/Primary Language Learning Preference: Explanation Highest Education Level: College or Above Preferred Language: English Cognitive Barrier Language Barrier: No Translator Needed: No Memory Deficit: No Emotional Barrier: No Cultural/Religious Beliefs Affecting Medical Care: No Physical Barrier Impaired Vision: Yes Glasses Knowledge/Comprehension Knowledge Level: Medium Comprehension Level: Medium Ability to understand written instructions: High Ability to understand verbal instructions: High Motivation Anxiety Level: Anxious Cooperation: Cooperative Education Importance: Acknowledges Need Interest in Health Problems: Asks Questions Perception: Coherent Willingness to Engage in Self-Management High Activities: Readiness to Engage in Self-Management High Activities: Electronic Signature(s) Signed: 07/16/2022 4:39:28 PM By: [name] RN Entered By: [name] on 07/16/2022 09:16:44 [name] ([id number]) [id number] Page 3 of 5 -------------------------------------------------------------------------------- Fall Risk Assessment Details Patient Name: Date of Service: [name] 07/16/2022 8:45 A M Medical Record Number: [id number] Patient Account Number:  [id number] Date of Birth/Sex: Treating RN: [name], [name] (66 y.o. [name]) [name] Primary Care Skye Rodarte: [name] Other Clinician: Referring Sandee Bernath: Treating Mikeala Girdler/Extender: [name] in Treatment: 0 Fall Risk Assessment Items Have you had 2 or more falls in the last 12 monthso 0 No Have you had any fall that resulted in injury in the last 12 monthso 0 No FALLS RISK SCREEN History of falling - immediate or within 3 months 0 No Secondary diagnosis (Do you have 2 or more medical diagnoseso) 0 No Ambulatory aid None/bed rest/wheelchair/nurse 0 Yes  Crutches/cane/walker 0 No Furniture 0 No Intravenous therapy Access/Saline/Heparin Lock 0 No Gait/Transferring Normal/ bed rest/ wheelchair 0 Yes Weak (short steps with or without shuffle, stooped but able to lift head while walking, may seek 0 No support from furniture) Impaired (short steps with shuffle, may have difficulty arising from chair, head down, impaired 0 No balance) Mental Status Oriented to own ability 0 Yes Electronic Signature(s) Signed: 07/16/2022 4:39:28 PM By: [name] RN Entered By: [name] on 07/16/2022 09:17:17 -------------------------------------------------------------------------------- Foot Assessment Details Patient Name: Date of Service: Matthew Broom [name] 07/16/2022 8:45 A M Medical Record Number: [id number] Patient Account Number: [id number] Date of Birth/Sex: Treating RN: [name] (66 y.o. [name]) [name] Primary Care Sway Guttierrez: [name] Other Clinician: Referring Xzavian Semmel: Treating Emmory Solivan/Extender: [name] in Treatment: 0 Foot Assessment Items Site Locations [name], [name] ([id number]) [phone] [id number] Page 4 of 5 + = Sensation present, - = Sensation absent, C = Callus, U = Ulcer R = Redness, W = Warmth, M = Maceration, PU = Pre-ulcerative lesion F = Fissure, S = Swelling, D =  Dryness Assessment Right: Left: Other Deformity: No No Prior Foot Ulcer: No No Prior Amputation: No No Charcot Joint: No No Ambulatory Status: Ambulatory Without Help Gait: Steady Electronic Signature(s) Signed: 07/16/2022 4:39:28 PM By: [name] RN Entered By: [name] on 07/16/2022 09:23:03 -------------------------------------------------------------------------------- Nutrition Risk Screening Details Patient Name: Date of Service: [name] 07/16/2022 8:45 A M Medical Record Number: [id number] Patient Account Number: [id number] Date of Birth/Sex: Treating RN: [birth year] (66 y.o. [name]) [name] Primary Care Jowanna Loeffler: [name] Other Clinician: Referring Quanta Roher: Treating Horice Carrero/Extender: [name] in Treatment: 0 Height (in): 73 Weight (lbs): 358 Body Mass Index (BMI): 47.2 Nutrition Risk Screening Items Score Screening NUTRITION RISK SCREEN: I have an illness or condition that made me change the kind and/or amount of food I eat 2 Yes I eat fewer than two meals per day 0 No I eat few fruits and vegetables, or milk products 0 No I have three or more drinks of beer, liquor or wine almost every day 0 No I have tooth or mouth problems that make it hard for me to eat 0 No I don't always have enough money to buy the food I need 0 No [name], [name] ([id number]) [phone] [id number] Page 5 of 5 I eat alone most of the time 0 No I take three or more different prescribed or over-the-counter drugs a day 1 Yes Without wanting to, I have lost or gained 10 pounds in the last six months 0 No I am not always physically able to shop, cook and/or feed myself 0 No Nutrition Protocols Good Risk Protocol Moderate Risk Protocol 0 Provide education on nutrition High Risk Proctocol Risk Level: Moderate Risk Score: 3 Electronic Signature(s) Signed: 07/16/2022 4:39:28 PM By: [name] RN Entered By: [name] on  07/16/2022 09:17:43

## 2022-07-16 NOTE — Progress Notes (Signed)
[name] ([id number]) [id number] Page 1 of 11 Visit Report for 07/16/2022 Allergy List Details Patient Name: Date of Service: [name] 07/16/2022 8:45 A M Medical Record Number: [id number] Patient Account Number: [id number] Date of Birth/Sex: Treating [name]: [birth year] (66 y.o. [name]) [name] Primary Care [name]: [name] Other Clinician: Referring [name]: Treating [name]/Extender: [name] in Treatment: 0 Allergies Active Allergies No Known Allergies Allergy Notes Electronic Signature(s) Signed: 07/16/2022 4:39:28 PM By: [name] Entered By: [name] on 07/16/2022 09:10:52 -------------------------------------------------------------------------------- Arrival Information Details Patient Name: Date of Service: Matthew Broom [name] 07/16/2022 8:45 A M Medical Record Number: [id number] Patient Account Number: [id number] Date of Birth/Sex: Treating [name]: February 19, 1956 (66 y.o. [name] Primary Care [name]: [name] Other Clinician: Referring [name]: Treating [name]/Extender: [name] in Treatment: 0 Visit Information Patient Arrived: [name] Time: 09:09 Accompanied By: wife Transfer Assistance: None Patient Identification Verified: Yes Secondary Verification Process Completed: Yes Patient Requires Transmission-Based Precautions: No Patient Has Alerts: No Electronic Signature(s) Signed: 07/16/2022 4:39:28 PM By: [name] Entered By: [name] on 07/16/2022 09:09:29 [name] ([id number]) [id number] Page 2 of 11 -------------------------------------------------------------------------------- Clinic Level of Care Assessment Details Patient Name: Date of Service: [name] 07/16/2022 8:45 A M Medical Record Number: [id number] Patient Account Number: [id number] Date of Birth/Sex: Treating [name]: 10-24-1955 (66  y.o. [name]) [name] Primary Care [name]: [name] Other Clinician: Referring [name]: Treating [name]/Extender: [name] in Treatment: 0 Clinic Level of Care Assessment Items TOOL 1 Quantity Score X- 1 0 Use when EandM and Procedure is performed on INITIAL visit ASSESSMENTS - Nursing Assessment / Reassessment X- 1 20 General Physical Exam (combine w/ comprehensive assessment (listed just below) when performed on new pt. evals) X- 1 25 Comprehensive Assessment (HX, ROS, Risk Assessments, Wounds Hx, etc.) ASSESSMENTS - Wound and Skin Assessment / Reassessment []  - 0 Dermatologic / Skin Assessment (not related to wound area) ASSESSMENTS - Ostomy and/or Continence Assessment and Care []  - 0 Incontinence Assessment and Management []  - 0 Ostomy Care Assessment and Management (repouching, etc.) PROCESS - Coordination of Care X - Simple Patient / Family Education for ongoing care 1 15 []  - 0 Complex (extensive) Patient / Family Education for ongoing care X- 1 10 Staff obtains Programmer, systems, Records, T Results / Process Orders est []  - 0 Staff telephones HHA, Nursing Homes / Clarify orders / etc []  - 0 Routine Transfer to another Facility (non-emergent condition) []  - 0 Routine Hospital Admission (non-emergent condition) X- 1 15 New Admissions / Biomedical engineer / Ordering NPWT Apligraf, etc. , []  - 0 Emergency Hospital Admission (emergent condition) PROCESS - Special Needs []  - 0 Pediatric / Minor Patient Management []  - 0 Isolation Patient Management []  - 0 Hearing / Language / Visual special needs []  - 0 Assessment of Community assistance (transportation, D/C planning, etc.) []  - 0 Additional assistance / Altered mentation []  - 0 Support Surface(s) Assessment (bed, cushion, seat, etc.) INTERVENTIONS - Miscellaneous []  - 0 External ear exam []  - 0 Patient Transfer (multiple staff / Civil Service fast streamer / Similar devices) []  - 0 Simple  Staple / Suture removal (25 or less) []  - 0 Complex Staple / Suture removal (26 or more) []  - 0 Hypo/Hyperglycemic Management (do not check if billed separately) [name] ([id number]) [id number] Page 3 of 11 X- 1 15 Ankle / Brachial Index (ABI) - do not  check if billed separately Has the patient been seen at the hospital within the last three years: Yes Total Score: 100 Level Of Care: New/Established - Level 3 Electronic Signature(s) Signed: 07/16/2022 4:39:28 PM By: [name] Entered By: [name] on 07/16/2022 10:03:27 -------------------------------------------------------------------------------- Compression Therapy Details Patient Name: Date of Service: Matthew Broom [name] 07/16/2022 8:45 A M Medical Record Number: [id number] Patient Account Number: [id number] Date of Birth/Sex: Treating [name]: March 19, 1956 (66 y.o. [name]) [name] Primary Care [name]: [name] Other Clinician: Referring [name]: Treating [name]/Extender: [name] in Treatment: 0 Compression Therapy Performed for Wound Assessment: Wound #1 Right,Anterior Lower Leg Performed By: Clinician Matthew Coria, [name] Compression Type: Four Layer Post Procedure Diagnosis Same as Pre-procedure Electronic Signature(s) Signed: 07/16/2022 4:39:28 PM By: [name] Entered By: [name] on 07/16/2022 10:00:51 -------------------------------------------------------------------------------- Compression Therapy Details Patient Name: Date of Service: Matthew Broom [name] 07/16/2022 8:45 A M Medical Record Number: [id number] Patient Account Number: [id number] Date of Birth/Sex: Treating [name]: 1955-10-28 (66 y.o. [name] Primary Care Cierra Rothgeb: [name] Other Clinician: Referring Camilla Skeen: Treating Demetrius Mahler/Extender: [name] in Treatment: 0 Compression Therapy Performed for Wound Assessment: Wound #2 Left,Circumferential  Lower Leg Performed By: Clinician Matthew Coria, [name] Compression Type: Four Layer Post Procedure Diagnosis Same as Pre-procedure Electronic Signature(s) Signed: 07/16/2022 4:39:28 PM By: [name] [name] [phone] ByCarlene Coria [name] [phone].pdf Page 4 of 11 Signed: 07/16/2022 4:39:28 Entered By: [name] on 07/16/2022 10:01:03 -------------------------------------------------------------------------------- Encounter Discharge Information Details Patient Name: Date of Service: [name] 07/16/2022 8:45 A M Medical Record Number: [id number] Patient Account Number: [id number] Date of Birth/Sex: Treating [name]: [birth year] (66 y.o. [name]) [name] Primary Care Kilee Hedding: [name] Other Clinician: Referring Dorell Gatlin: Treating Loula Marcella/Extender: [name] in Treatment: 0 Encounter Discharge Information Items Discharge Condition: Stable Ambulatory Status: Ambulatory Discharge Destination: Home Transportation: Private Auto Accompanied By: friend Schedule Follow-up Appointment: Yes Clinical Summary of Care: Electronic Signature(s) Signed: 07/16/2022 4:39:28 PM By: [name] Entered By: [name] on 07/16/2022 10:04:11 -------------------------------------------------------------------------------- Lower Extremity Assessment Details Patient Name: Date of Service: [name] 07/16/2022 8:45 A M Medical Record Number: [id number] Patient Account Number: [id number] Date of Birth/Sex: Treating [name]: 08-Aug-1955 (66 y.o. [name]) [name] Primary Care Elfreda Blanchet: [name] Other Clinician: Referring Harjit Leider: Treating Blayde Bacigalupi/Extender: [name] in Treatment: 0 Edema Assessment Assessed: [Left: No] [Right: No] [Left: Edema] [Right: :] Calf Left: Right: Point of Measurement: 35 cm From Medial Instep 64 cm 60 cm Ankle Left: Right: Point of Measurement: 10 cm From Medial  Instep 32 cm 32 cm [name], Matthew ([id number]) [phone] Page 5 of 11 Knee To Floor Left: Right: From Medial Instep 50 cm 50 cm Vascular Assessment Pulses: Dorsalis Pedis Palpable: [Left:Yes] [Right:Yes] Posterior Tibial Palpable: [Left:Yes] [Right:Yes] Blood Pressure: Brachial: [Left:126] [Right:126] Ankle: [Left:Dorsalis Pedis: 110 0.87] [Right:Dorsalis Pedis: 100 0.79] Electronic Signature(s) Signed: 07/16/2022 4:39:28 PM By: [name] Entered By: [name] on 07/16/2022 09:43:49 -------------------------------------------------------------------------------- Multi Wound Chart Details Patient Name: Date of Service: Matthew Broom [name] 07/16/2022 8:45 A M Medical Record Number: [id number] Patient Account Number: [id number] Date of Birth/Sex: Treating [name]: 1956-03-19 (66 y.o. [name] Primary Care Kiegan Macaraeg: [name] Other Clinician: Referring Georgette Helmer: Treating Perrie Ragin/Extender: [name] in Treatment: 0 Vital Signs Height(in): 73 Pulse(bpm): 80 Weight(lbs): 358 Blood Pressure(mmHg): 126/80 Body Mass Index(BMI): 47.2 Temperature(F):  97.6 Respiratory Rate(breaths/min): 18 [1:Photos:] [N/A:N/A] Right, Anterior Lower Leg Left, Circumferential Lower Leg N/A Wound Location: Gradually Appeared Gradually Appeared N/A Wounding Event: Diabetic Wound/Ulcer of the Lower Diabetic Wound/Ulcer of the Lower N/A Primary Etiology: Extremity Extremity Arrhythmia, Congestive Heart Failure, Arrhythmia, Congestive Heart Failure, N/A Comorbid History: Hypertension, Type II Diabetes Hypertension, Type II Diabetes 06/15/2022 06/15/2022 N/A Date Acquired: 0 0 N/A Weeks of Treatment: Open Open N/A Wound Status: No No N/A Wound Recurrence: 7.5x9.5x0.1 15x51x0.1 N/A Measurements L x W x D (cm) 55.96 600.83 N/A A (cm) : rea 5.596 60.083 N/A Volume (cm) : Grade 1 Grade 1 N/A Classification: [name], [name]  ([id number]) [phone] Page 6 of 11 Medium Medium N/A Exudate Amount: Serosanguineous Serosanguineous N/A Exudate Type: red, brown red, brown N/A Exudate Color: Medium (34-66%) Medium (34-66%) N/A Granulation Amount: Red Red N/A Granulation Quality: Medium (34-66%) Medium (34-66%) N/A Necrotic Amount: Fat Layer (Subcutaneous Tissue): Yes Fat Layer (Subcutaneous Tissue): Yes N/A Exposed Structures: Fascia: No Fascia: No Tendon: No Tendon: No Muscle: No Muscle: No Joint: No Joint: No Bone: No Bone: No None None N/A Epithelialization: Treatment Notes Electronic Signature(s) Signed: 07/16/2022 4:39:28 PM By: [name] Entered By: [name] on 07/16/2022 09:43:58 -------------------------------------------------------------------------------- Multi-Disciplinary Care Plan Details Patient Name: Date of Service: Matthew Broom [name] 07/16/2022 8:45 A M Medical Record Number: [id number] Patient Account Number: [id number] Date of Birth/Sex: Treating [name]: May 16, 1956 (66 y.o. [name]) [name] Primary Care Lolamae Voisin: [name] Other Clinician: Referring Brookelynne Dimperio: Treating Sheretta Grumbine/Extender: [name] in Treatment: 0 Active Inactive Necrotic Tissue Nursing Diagnoses: Knowledge deficit related to management of necrotic/devitalized tissue Goals: Necrotic/devitalized tissue will be minimized in the wound bed Date Initiated: 07/16/2022 Target Resolution Date: 08/14/2022 Goal Status: Active Patient/caregiver will verbalize understanding of reason and process for debridement of necrotic tissue Date Initiated: 07/16/2022 Target Resolution Date: 08/14/2022 Goal Status: Active Interventions: Assess patient pain level pre-, during and post procedure and prior to discharge Provide education on necrotic tissue and debridement process Notes: Wound/Skin Impairment Nursing Diagnoses: Knowledge deficit related to ulceration/compromised  skin integrity Goals: Patient/caregiver will verbalize understanding of skin care regimen Date Initiated: 07/16/2022 Target Resolution Date: 08/14/2022 Goal Status: Active Ulcer/skin breakdown will have a volume reduction of 30% by week 4 Date Initiated: 07/16/2022 Target Resolution Date: 08/14/2022 [name], [name] ([id number]) [id number] Page 7 of 11 Goal Status: Active Ulcer/skin breakdown will have a volume reduction of 50% by week 8 Date Initiated: 07/16/2022 Target Resolution Date: 09/14/2022 Goal Status: Active Ulcer/skin breakdown will have a volume reduction of 80% by week 12 Date Initiated: 07/16/2022 Target Resolution Date: 10/14/2022 Goal Status: Active Ulcer/skin breakdown will heal within 14 weeks Date Initiated: 07/16/2022 Target Resolution Date: 11/14/2022 Goal Status: Active Interventions: Assess patient/caregiver ability to obtain necessary supplies Assess patient/caregiver ability to perform ulcer/skin care regimen upon admission and as needed Assess ulceration(s) every visit Notes: Electronic Signature(s) Signed: 07/16/2022 4:39:28 PM By: [name] Entered By: [name] on 07/16/2022 09:45:27 -------------------------------------------------------------------------------- Pain Assessment Details Patient Name: Date of Service: Matthew Broom [name] 07/16/2022 8:45 A M Medical Record Number: [id number] Patient Account Number: [id number] Date of Birth/Sex: Treating [name]: 1956/04/01 (66 y.o. [name]) [name] Primary Care Shawny Borkowski: [name] Other Clinician: Referring Shanard Treto: Treating Jerrico Covello/Extender: [name] in Treatment: 0 Active Problems Location of Pain Severity and Description of Pain Patient Has Paino No Site Locations Pain Management and Medication Current Pain Management: Electronic Signature(s) Signed: 07/16/2022 4:39:28 PM By: Dolores Lory,  [address] SAMIER, JACO ([id number])  [id number] Page 8 of 11 Entered By: Yevonne Pax on 07/16/2022 09:09:47 -------------------------------------------------------------------------------- Patient/Caregiver Education Details Patient Name: Date of Service: [name] 2/1/2024andnbsp8:45 A M Medical Record Number: [id number] Patient Account Number: [id number] Date of Birth/Gender: Treating [name]: 1956-06-15 (66 y.o. [name]) Yevonne Pax Primary Care Physician: [name] Other Clinician: Referring Physician: Treating Physician/Extender: Matthew Folks in Treatment: 0 Education Assessment Education Provided To: Patient Education Topics Provided Wound/Skin Impairment: Methods: Explain/Verbal Responses: State content correctly Electronic Signature(s) Signed: 07/16/2022 4:39:28 PM By: Yevonne Pax [name] Entered By: Yevonne Pax on 07/16/2022 09:44:22 -------------------------------------------------------------------------------- Wound Assessment Details Patient Name: Date of Service: [name] [name] 07/16/2022 8:45 A M Medical Record Number: [id number] Patient Account Number: [id number] Date of Birth/Sex: Treating [name]: [birth year] (66 y.o. [name]) Yevonne Pax Primary Care Aniqua Briere: [name] Other Clinician: Referring Athelene Hursey: Treating Joely Losier/Extender: Matthew Folks in Treatment: 0 Wound Status Wound Number: 1 Primary Diabetic Wound/Ulcer of the Lower Extremity Etiology: Wound Location: Right, Anterior Lower Leg Wound Status: Open Wounding Event: Gradually Appeared Comorbid Arrhythmia, Congestive Heart Failure, Hypertension, Type II Date Acquired: 06/15/2022 History: Diabetes Weeks Of Treatment: 0 Clustered Wound: No Photos SHRIYAN, ARAKAWA ([id number]) [id number] Page 9 of 11 Wound Measurements Length: (cm) 7.5 Width: (cm) 9.5 Depth: (cm) 0.1 Area: (cm) 55.96 Volume: (cm) 5.596 % Reduction in Area: % Reduction  in Volume: Epithelialization: None Tunneling: No Undermining: No Wound Description Classification: Grade 1 Exudate Amount: Medium Exudate Type: Serosanguineous Exudate Color: red, brown Foul Odor After Cleansing: No Slough/Fibrino Yes Wound Bed Granulation Amount: Medium (34-66%) Exposed Structure Granulation Quality: Red Fascia Exposed: No Necrotic Amount: Medium (34-66%) Fat Layer (Subcutaneous Tissue) Exposed: Yes Necrotic Quality: Adherent Slough Tendon Exposed: No Muscle Exposed: No Joint Exposed: No Bone Exposed: No Treatment Notes Wound #1 (Lower Leg) Wound Laterality: Right, Anterior Cleanser Soap and Water Discharge Instruction: Gently cleanse wound with antibacterial soap, rinse and pat dry prior to dressing wounds Peri-Wound Care Topical Primary Dressing AquacelAg Advantage Dressing, 4x5 (in/in) Discharge Instruction: Apply to wound as directed Secondary Dressing ABD Pad 5x9 (in/in) Discharge Instruction: Cover with ABD pad Secured With Compression Wrap Medichoice 4 layer Compression System, 35-40 mmHG Discharge Instruction: Apply multi-layer wrap as directed. Compression Stockings Add-Ons Electronic Signature(s) Signed: 07/16/2022 4:39:28 PM By: Yevonne Pax [name] Entered By: Yevonne Pax on 07/16/2022 09:41:18 [name] ([id number]) [id number] Page 10 of 11 -------------------------------------------------------------------------------- Wound Assessment Details Patient Name: Date of Service: [name] 07/16/2022 8:45 A M Medical Record Number: [id number] Patient Account Number: [id number] Date of Birth/Sex: Treating [name]: 02-15-1956 (66 y.o. [name]) Yevonne Pax Primary Care Shalondra Wunschel: [name] Other Clinician: Referring Ezzard Ditmer: Treating Destinee Taber/Extender: Matthew Folks in Treatment: 0 Wound Status Wound Number: 2 Primary Diabetic Wound/Ulcer of the Lower Extremity Etiology: Wound Location:  Left, Circumferential Lower Leg Wound Status: Open Wounding Event: Gradually Appeared Comorbid Arrhythmia, Congestive Heart Failure, Hypertension, Type II Date Acquired: 06/15/2022 History: Diabetes Weeks Of Treatment: 0 Clustered Wound: No Photos Wound Measurements Length: (cm) 15 Width: (cm) 51 Depth: (cm) 0.1 Area: (cm) 600.83 Volume: (cm) 60.083 % Reduction in Area: % Reduction in Volume: Epithelialization: None Tunneling: No Undermining: No Wound Description Classification: Grade 1 Exudate Amount: Medium Exudate Type: Serosanguineous Exudate Color: red, brown Foul Odor After Cleansing: No Slough/Fibrino Yes Wound Bed Granulation Amount: Medium (34-66%) Exposed Structure Granulation Quality: Red Fascia Exposed: No Necrotic Amount: Medium (34-66%) Fat Layer (Subcutaneous Tissue) Exposed:  Yes Necrotic Quality: Adherent Slough Tendon Exposed: No Muscle Exposed: No Joint Exposed: No Bone Exposed: No Treatment Notes Wound #2 (Lower Leg) Wound Laterality: Left, Circumferential Cleanser Soap and Water Discharge Instruction: Gently cleanse wound with antibacterial soap, rinse and pat dry prior to dressing wounds Peri-Wound Care AUTUMN, HAMPE ([id number]) [phone].pdf Page 11 of 11 Topical Primary Dressing AquacelAg Advantage Dressing, 4x5 (in/in) Discharge Instruction: Apply to wound as directed Secondary Dressing ABD Pad 5x9 (in/in) Discharge Instruction: Cover with ABD pad Secured With Compression Wrap Medichoice 4 layer Compression System, 35-40 mmHG Discharge Instruction: Apply multi-layer wrap as directed. Compression Stockings Add-Ons Electronic Signature(s) Signed: 07/16/2022 4:39:28 PM By: [name] Entered By: [name] on 07/16/2022 09:42:35 -------------------------------------------------------------------------------- Vitals Details Patient Name: Date of Service: [name], Elderon. 07/16/2022 8:45 A M Medical  Record Number: [id number] Patient Account Number: [id number] Date of Birth/Sex: Treating [name]: [birth year] (66 y.o. [name]) [name] Primary Care Akasha Melena: [name] Other Clinician: Referring Annslee Tercero: Treating Shela Esses/Extender: [name] in Treatment: 0 Vital Signs Time Taken: 09:09 Temperature (F): 97.6 Height (in): 73 Pulse (bpm): 80 Source: Stated Respiratory Rate (breaths/min): 18 Weight (lbs): 358 Blood Pressure (mmHg): 126/80 Source: Stated Reference Range: 80 - 120 mg / dl Body Mass Index (BMI): 47.2 Electronic Signature(s) Signed: 07/16/2022 4:39:28 PM By: [name] Entered By: [name] on 07/16/2022 09:10:27

## 2022-07-16 NOTE — Progress Notes (Signed)
YANIXAN, MELLINGER ([id number]) [id number] Page 1 of 8 Visit Report for 07/16/2022 Chief Complaint Document Details Patient Name: Date of Service: [name] 07/16/2022 8:45 A M Medical Record Number: [id number] Patient Account Number: [id number] Date of Birth/Sex: Treating RN: September 02, 1955 (67 y.o. [name]) [name] Primary Care Provider: [name] Other Clinician: Referring Provider: Treating Provider/Extender: [name] in Treatment: 0 Information Obtained from: Patient Chief Complaint Bilateral LE ulcers and lymphedema Electronic Signature(s) Signed: 07/16/2022 9:52:52 AM By: Worthy Keeler PA-C Entered By: Worthy Keeler on 07/16/2022 09:52:52 -------------------------------------------------------------------------------- HPI Details Patient Name: Date of Service: [name], [place]. 07/16/2022 8:45 A M Medical Record Number: [id number] Patient Account Number: [id number] Date of Birth/Sex: Treating RN: July 01, 1955 (67 y.o. Oval Linsey Primary Care Provider: [name] Other Clinician: Referring Provider: Treating Provider/Extender: [name] in Treatment: 0 History of Present Illness HPI Description: 07-16-2022 upon evaluation today patient appears to be doing poorly currently in regard to his his legs. The left leg is actually worse than the right. This is a patient that is previously been seen in the [place] office and at that point was doing really quite well with compression wrapping and often alginate are either Hydrofera Blue dressings. Fortunately he has gone since November 2020 until just current with out having any issue or need for wound care services which is great news. Unfortunately he is here today because he is having some issues at this point. Patient does have a history of several medical conditions which include diabetes mellitus type 2, lymphedema, hypertension, congestive  heart failure, and cardiac arrhythmia though is not sure that this is actually atrial fibrillation based on what he has been told. He does have lymphedema pumps but tells me he has not used them since the summer when he had to move to a remodel of his building and subsequently never unpacked them. Electronic Signature(s) Signed: 07/16/2022 10:34:33 AM By: Worthy Keeler PA-C Entered By: Worthy Keeler on 07/16/2022 10:34:32 [name] ([id number]) [id number] Page 2 of 8 -------------------------------------------------------------------------------- Physical Exam Details Patient Name: Date of Service: [name] 07/16/2022 8:45 A M Medical Record Number: [id number] Patient Account Number: [id number] Date of Birth/Sex: Treating RN: 06/03/1956 (67 y.o. [name]) [name] Primary Care Provider: [name] Other Clinician: Referring Provider: Treating Provider/Extender: [name] in Treatment: 0 Constitutional sitting or standing blood pressure is within target range for patient.. pulse regular and within target range for patient.Marland Kitchen respirations regular, non-labored and within target range for patient.Marland Kitchen temperature within target range for patient.. Well-nourished and well-hydrated in no acute distress. Eyes conjunctiva clear no eyelid edema noted. pupils equal round and reactive to light and accommodation. Ears, Nose, Mouth, and Throat no gross abnormality of ear auricles or external auditory canals. normal hearing noted during conversation. mucus membranes moist. Respiratory normal breathing without difficulty. Cardiovascular 2+ dorsalis pedis/posterior tibialis pulses. 2+ pitting edema of the bilateral lower extremities. Musculoskeletal normal gait and posture. no significant deformity or arthritic changes, no loss or range of motion, no clubbing. Psychiatric this patient is able to make decisions and demonstrates good  insight into disease process. Alert and Oriented x 3. pleasant and cooperative. Notes Upon inspection patient's wounds currently do not appear to be too deep the left leg again inspection of the medial aspect is worse than the right leg for sure. With that being said I do not see any signs of active  infection locally nor systemically which is great news and overall I am extremely pleased with where we stand today. In general I do think that he is going require compression therapy to help calm down some of the irritation in regard to the legs and help get the fluid out as well. I discussed that with him today. With that being said I do believe that he is overall though doing excellent and I think that if he follows the directions with the compression wraps and elevation gets back on using his pumps he should do really well as far as healing is concerned. Electronic Signature(s) Signed: 07/16/2022 10:35:35 AM By: Lenda Kelp PA-C Entered By: Lenda Kelp on 07/16/2022 10:35:35 -------------------------------------------------------------------------------- Physician Orders Details Patient Name: Date of Service: [name] 07/16/2022 8:45 A M Medical Record Number: [id number] Patient Account Number: [id number] Date of Birth/Sex: Treating RN: 1956-05-25 (67 y.o. [name] Primary Care Provider: [name] Other Clinician: Referring Provider: Treating Provider/Extender: Matthew Folks in Treatment: 0 KAMERAN, MCNEESE ([id number]) [id number] Page 3 of 8 Verbal / Phone Orders: No Diagnosis Coding ICD-10 Coding Code Description E11.622 Type 2 diabetes mellitus with other skin ulcer I89.0 Lymphedema, not elsewhere classified L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 L97.812 Non-pressure chronic ulcer of other part of right lower leg with fat layer exposed I10 Essential (primary)  hypertension I50.42 Chronic combined systolic (congestive) and diastolic (congestive) heart failure I49.9 Cardiac arrhythmia, unspecified Follow-up Appointments Return Appointment in 1 week. Bathing/ Shower/ Hygiene May shower with wound dressing protected with water repellent cover or cast protector. Anesthetic (Use 'Patient Medications' Section for Anesthetic Order Entry) Lidocaine applied to wound bed Edema Control - Lymphedema / Segmental Compressive Device / Other Elevate, Exercise Daily and A void Standing for Long Periods of Time. Elevate legs to the level of the heart and pump ankles as often as possible Elevate leg(s) parallel to the floor when sitting. Compression Pump: Use compression pump on left lower extremity for 60 minutes, twice daily. Compression Pump: Use compression pump on right lower extremity for 60 minutes, twice daily. Wound Treatment Wound #1 - Lower Leg Wound Laterality: Right, Anterior Cleanser: Soap and Water 1 x Per Week/30 Days Discharge Instructions: Gently cleanse wound with antibacterial soap, rinse and pat dry prior to dressing wounds Prim Dressing: AquacelAg Advantage Dressing, 4x5 (in/in) 1 x Per Week/30 Days ary Discharge Instructions: Apply to wound as directed Secondary Dressing: ABD Pad 5x9 (in/in) 1 x Per Week/30 Days Discharge Instructions: Cover with ABD pad Compression Wrap: Medichoice 4 layer Compression System, 35-40 mmHG 1 x Per Week/30 Days Discharge Instructions: Apply multi-layer wrap as directed. Wound #2 - Lower Leg Wound Laterality: Left, Circumferential Cleanser: Soap and Water 1 x Per Week/30 Days Discharge Instructions: Gently cleanse wound with antibacterial soap, rinse and pat dry prior to dressing wounds Prim Dressing: AquacelAg Advantage Dressing, 4x5 (in/in) 1 x Per Week/30 Days ary Discharge Instructions: Apply to wound as directed Secondary Dressing: ABD Pad 5x9 (in/in) 1 x Per Week/30 Days Discharge Instructions: Cover  with ABD pad Compression Wrap: Medichoice 4 layer Compression System, 35-40 mmHG 1 x Per Week/30 Days Discharge Instructions: Apply multi-layer wrap as directed. Patient Medications llergies: No Known Allergies A Notifications Medication Indication Start End 07/16/2022 doxycycline hyclate DOSE 1 - oral 100 mg capsule - 1 capsule oral twice a day x 14 days Electronic Signature(s) Signed: 07/16/2022 10:37:54 AM By: Linwood Dibbles,  Margarita Grizzle PA-C Entered By: Worthy Keeler on 07/16/2022 10:37:53 [name] ([id number]) [id number] Page 4 of 8 -------------------------------------------------------------------------------- Problem List Details Patient Name: Date of Service: [name] 07/16/2022 8:45 A M Medical Record Number: [id number] Patient Account Number: [id number] Date of Birth/Sex: Treating RN: 1955-10-07 (66 y.o. [name]) [name] Primary Care Provider: [name] Other Clinician: Referring Provider: Treating Provider/Extender: [name] in Treatment: 0 Active Problems ICD-10 Encounter Code Description Active Date MDM Diagnosis E11.622 Type 2 diabetes mellitus with other skin ulcer 07/16/2022 No Yes I89.0 Lymphedema, not elsewhere classified 07/16/2022 No Yes L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2/06/2022 No Yes L97.812 Non-pressure chronic ulcer of other part of right lower leg with fat layer 07/16/2022 No Yes exposed [place] (primary) hypertension 07/16/2022 No Yes I50.42 Chronic combined systolic (congestive) and diastolic (congestive) heart failure 07/16/2022 No Yes I49.9 Cardiac arrhythmia, unspecified 07/16/2022 No Yes Inactive Problems Resolved Problems Electronic Signature(s) Signed: 07/16/2022 9:52:29 AM By: Worthy Keeler PA-C Entered By: Worthy Keeler on 07/16/2022 09:52:29 [name] ([id number]) [id number] Page 5 of  8 -------------------------------------------------------------------------------- Progress Note Details Patient Name: Date of Service: [name] 07/16/2022 8:45 A M Medical Record Number: [id number] Patient Account Number: [id number] Date of Birth/Sex: Treating RN: 1956-06-13 (66 y.o. [name]) [name] Primary Care Provider: [name] Other Clinician: Referring Provider: Treating Provider/Extender: [name] in Treatment: 0 Subjective Chief Complaint Information obtained from Patient Bilateral LE ulcers and lymphedema History of Present Illness (HPI) 07-16-2022 upon evaluation today patient appears to be doing poorly currently in regard to his his legs. The left leg is actually worse than the right. This is a patient that is previously been seen in the [place] office and at that point was doing really quite well with compression wrapping and often alginate are either Hydrofera Blue dressings. Fortunately he has gone since November 2020 until just current with out having any issue or need for wound care services which is great news. Unfortunately he is here today because he is having some issues at this point. Patient does have a history of several medical conditions which include diabetes mellitus type 2, lymphedema, hypertension, congestive heart failure, and cardiac arrhythmia though is not sure that this is actually atrial fibrillation based on what he has been told. He does have lymphedema pumps but tells me he has not used them since the summer when he had to move to a remodel of his building and subsequently never unpacked them. Patient History Allergies No Known Allergies Social History Former smoker, Marital Status - Divorced, Alcohol Use - Never, Drug Use - No History, Caffeine Use - Daily. Medical History Cardiovascular Patient has history of Arrhythmia, Congestive Heart Failure, Hypertension Endocrine Patient has history of Type II  Diabetes Patient is treated with Insulin. Review of Systems (ROS) Eyes Complains or has symptoms of Vision Changes. Integumentary (Skin) Complains or has symptoms of Swelling. Objective Constitutional sitting or standing blood pressure is within target range for patient.. pulse regular and within target range for patient.Marland Kitchen respirations regular, non-labored and within target range for patient.Marland Kitchen temperature within target range for patient.. Well-nourished and well-hydrated in no acute distress. Vitals Time Taken: 9:09 AM, Height: 73 in, Source: Stated, Weight: 358 lbs, Source: Stated, BMI: 47.2, Temperature: 97.6 F, Pulse: 80 bpm, Respiratory Rate: 18 breaths/min, Blood Pressure: 126/80 mmHg. Eyes conjunctiva clear no eyelid edema noted. pupils equal round  and reactive to light and accommodation. Ears, Nose, Mouth, and Throat no gross abnormality of ear auricles or external auditory canals. normal hearing noted during conversation. mucus membranes moist. Respiratory DEONDRE, MARINARO ([id number]) [id number] Page 6 of 8 normal breathing without difficulty. Cardiovascular 2+ dorsalis pedis/posterior tibialis pulses. 2+ pitting edema of the bilateral lower extremities. Musculoskeletal normal gait and posture. no significant deformity or arthritic changes, no loss or range of motion, no clubbing. Psychiatric this patient is able to make decisions and demonstrates good insight into disease process. Alert and Oriented x 3. pleasant and cooperative. General Notes: Upon inspection patient's wounds currently do not appear to be too deep the left leg again inspection of the medial aspect is worse than the right leg for sure. With that being said I do not see any signs of active infection locally nor systemically which is great news and overall I am extremely pleased with where we stand today. In general I do think that he is going require compression therapy to help calm  down some of the irritation in regard to the legs and help get the fluid out as well. I discussed that with him today. With that being said I do believe that he is overall though doing excellent and I think that if he follows the directions with the compression wraps and elevation gets back on using his pumps he should do really well as far as healing is concerned. Integumentary (Hair, Skin) Wound #1 status is Open. Original cause of wound was Gradually Appeared. The date acquired was: 06/15/2022. The wound is located on the Right,Anterior Lower Leg. The wound measures 7.5cm length x 9.5cm width x 0.1cm depth; 55.96cm^2 area and 5.596cm^3 volume. There is Fat Layer (Subcutaneous Tissue) exposed. There is no tunneling or undermining noted. There is a medium amount of serosanguineous drainage noted. There is medium (34-66%) red granulation within the wound bed. There is a medium (34-66%) amount of necrotic tissue within the wound bed including Adherent Slough. Wound #2 status is Open. Original cause of wound was Gradually Appeared. The date acquired was: 06/15/2022. The wound is located on the Left,Circumferential Lower Leg. The wound measures 15cm length x 51cm width x 0.1cm depth; 600.83cm^2 area and 60.083cm^3 volume. There is Fat Layer (Subcutaneous Tissue) exposed. There is no tunneling or undermining noted. There is a medium amount of serosanguineous drainage noted. There is medium (34-66%) red granulation within the wound bed. There is a medium (34-66%) amount of necrotic tissue within the wound bed including Adherent Slough. Assessment Active Problems ICD-10 Type 2 diabetes mellitus with other skin ulcer Lymphedema, not elsewhere classified Non-pressure chronic ulcer of other part of left lower leg with fat layer exposed Non-pressure chronic ulcer of other part of right lower leg with fat layer exposed Essential (primary) hypertension Chronic combined systolic (congestive) and diastolic  (congestive) heart failure Cardiac arrhythmia, unspecified Procedures Wound #1 Pre-procedure diagnosis of Wound #1 is a Diabetic Wound/Ulcer of the Lower Extremity located on the Right,Anterior Lower Leg . There was a Four Layer Compression Therapy Procedure by [name], RN. Post procedure Diagnosis Wound #1: Same as Pre-Procedure Wound #2 Pre-procedure diagnosis of Wound #2 is a Diabetic Wound/Ulcer of the Lower Extremity located on the Left,Circumferential Lower Leg . There was a Four Layer Compression Therapy Procedure by [name], RN. Post procedure Diagnosis Wound #2: Same as Pre-Procedure Plan Follow-up Appointments: Return Appointment in 1 week. Bathing/ Shower/ Hygiene: May shower with wound dressing protected with water repellent cover or cast  protector. Anesthetic (Use 'Patient Medications' Section for Anesthetic Order Entry): Lidocaine applied to wound bed Edema Control - Lymphedema / Segmental Compressive Device / Other: Elevate, Exercise Daily and Avoid Standing for Long Periods of Time. Elevate legs to the level of the heart and pump ankles as often as possible Elevate leg(s) parallel to the floor when sitting. Compression Pump: Use compression pump on left lower extremity for 60 minutes, twice daily. Compression Pump: Use compression pump on right lower extremity for 60 minutes, twice daily. The following medication(s) was prescribed: doxycycline hyclate oral 100 mg capsule 1 1 capsule oral twice a day x 14 days starting 07/16/2022 WOUND #1: - Lower Leg Wound Laterality: Right, Anterior Cleanser: Soap and Water 1 x Per Week/30 Days Discharge Instructions: Gently cleanse wound with antibacterial soap, rinse and pat dry prior to dressing wounds Prim Dressing: AquacelAg Advantage Dressing, 4x5 (in/in) 1 x Per Week/30 Days RILAN, EILAND ([id number]) [id number] Page 7 of 8 Discharge Instructions: Apply to wound as directed Secondary  Dressing: ABD Pad 5x9 (in/in) 1 x Per Week/30 Days Discharge Instructions: Cover with ABD pad Com pression Wrap: Medichoice 4 layer Compression System, 35-40 mmHG 1 x Per Week/30 Days Discharge Instructions: Apply multi-layer wrap as directed. WOUND #2: - Lower Leg Wound Laterality: Left, Circumferential Cleanser: Soap and Water 1 x Per Week/30 Days Discharge Instructions: Gently cleanse wound with antibacterial soap, rinse and pat dry prior to dressing wounds Prim Dressing: AquacelAg Advantage Dressing, 4x5 (in/in) 1 x Per Week/30 Days ary Discharge Instructions: Apply to wound as directed Secondary Dressing: ABD Pad 5x9 (in/in) 1 x Per Week/30 Days Discharge Instructions: Cover with ABD pad Com pression Wrap: Medichoice 4 layer Compression System, 35-40 mmHG 1 x Per Week/30 Days Discharge Instructions: Apply multi-layer wrap as directed. 1. I am going to suggest currently that we go ahead and initiate a 4-layer compression wrap for the patient which I think should help him do much better in general. He is in agreement with that plan. 2. I am also going to recommend that the patient should continue to monitor for any signs of infection as far as increased pain is concerned I know we will be wrapping we will be able to see anything however I am going to send in a prescription for him as a broad-spectrum antibiotic just because some of the redness I believe it may be more lymphedema related but with him being wrapped I do not want this to get any worse. 3. I am also can recommend that we continue with the silver alginate dressing along with ABD pads to follow. 4. We did advise that if his wrap starts to slide down he should contact the office soon as possible to get back him he is aware and voiced understanding. We will see patient back for reevaluation in 1 week here in the clinic. If anything worsens or changes patient will contact our office for additional recommendations. Electronic  Signature(s) Signed: 07/16/2022 10:38:08 AM By: Worthy Keeler PA-C Previous Signature: 07/16/2022 10:36:35 AM Version By: Worthy Keeler PA-C Entered By: Worthy Keeler on 07/16/2022 10:38:08 -------------------------------------------------------------------------------- ROS/PFSH Details Patient Name: Date of Service: [name], [place]. 07/16/2022 8:45 A M Medical Record Number: [id number] Patient Account Number: [id number] Date of Birth/Sex: Treating RN: [birth year] (66 y.o. Oval Linsey Primary Care Provider: [name] Other Clinician: Referring Provider: Treating Provider/Extender: [name] in Treatment: 0 Eyes Complaints and Symptoms: Positive for: Vision Changes Integumentary (Skin) Complaints  and Symptoms: Positive for: Swelling Cardiovascular Medical History: Positive for: Arrhythmia; Congestive Heart Failure; Hypertension Endocrine Medical History: Positive for: Type II Diabetes Time with diabetes: 10 Treated with: Insulin JAYVYN, BALINGIT ([id number]) [id number] Page 8 of 8 Immunizations Pneumococcal Vaccine: Received Pneumococcal Vaccination: No Implantable Devices No devices added Family and Social History Former smoker; Marital Status - Divorced; Alcohol Use: Never; Drug Use: No History; Caffeine Use: Daily Electronic Signature(s) Signed: 07/16/2022 4:39:28 PM By: [name] RN Signed: 07/16/2022 4:41:53 PM By: Worthy Keeler PA-C Entered By: [name] on 07/16/2022 09:15:38 -------------------------------------------------------------------------------- SuperBill Details Patient Name: Date of Service: [name] 07/16/2022 Medical Record Number: [id number] Patient Account Number: [id number] Date of Birth/Sex: Treating RN: Feb 07, 1956 (66 y.o. [name]) [name] Primary Care Provider: [name] Other Clinician: Referring Provider: Treating Provider/Extender: [name] in Treatment: 0 Diagnosis Coding ICD-10 Codes Code Description [phone] Type 2 diabetes mellitus with other skin ulcer I89.0 Lymphedema, not elsewhere classified L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 L97.812 Non-pressure chronic ulcer of other part of right lower leg with fat layer exposed I10 Essential (primary) hypertension I50.42 Chronic combined systolic (congestive) and diastolic (congestive) heart failure I49.9 Cardiac arrhythmia, unspecified Facility Procedures : CPT4 Code: [id number] Description: 99213 - WOUND CARE VISIT-LEV 3 EST PT Modifier: Quantity: 1 Physician Procedures : CPT4 Code Description Modifier [id number] - WC PHYS LEVEL 4 - NEW PT ICD-10 Diagnosis Description E11.622 Type 2 diabetes mellitus with other skin ulcer I89.0 Lymphedema, not elsewhere classified L97.822 Non-pressure chronic ulcer of other part of  left lower leg with fat layer exposed L97.812 Non-pressure chronic ulcer of other part of right lower leg with fat layer exposed Quantity: 1 Electronic Signature(s) Signed: 07/16/2022 10:36:47 AM By: Worthy Keeler PA-C Entered By: Worthy Keeler on 07/16/2022 10:36:47

## 2022-07-23 ENCOUNTER — Encounter: Payer: PPO | Admitting: Physician Assistant

## 2022-07-23 DIAGNOSIS — E11622 Type 2 diabetes mellitus with other skin ulcer: Secondary | ICD-10-CM | POA: Diagnosis not present

## 2022-07-23 DIAGNOSIS — L97812 Non-pressure chronic ulcer of other part of right lower leg with fat layer exposed: Secondary | ICD-10-CM | POA: Diagnosis not present

## 2022-07-23 DIAGNOSIS — L97822 Non-pressure chronic ulcer of other part of left lower leg with fat layer exposed: Secondary | ICD-10-CM | POA: Diagnosis not present

## 2022-07-23 NOTE — Progress Notes (Addendum)
[name] ([id number]) [id number] Page 1 of 10 Visit Report for 07/23/2022 Arrival Information Details Patient Name: Date of Service: [name] 07/23/2022 11:15 A M Medical Record Number: [id number] Patient Account Number: [id number] Date of Birth/Sex: Treating RN: [birth year] (67 y.o. [name]) [name] Primary Care [name]: [name] Other Clinician: Referring [name]: Treating [name]/Extender: [name] in Treatment: 1 Visit Information History Since Last Visit Added or deleted any medications: No Patient Arrived: [name] Any new allergies or adverse reactions: No Arrival Time: 11:10 Had a fall or experienced change in No Accompanied By: friend activities of daily living that may affect Transfer Assistance: None risk of falls: Patient Identification Verified: Yes Signs or symptoms of abuse/neglect since last visito No Secondary Verification Process Completed: Yes Hospitalized since last visit: No Patient Requires Transmission-Based Precautions: No Implantable device outside of the clinic excluding No Patient Has Alerts: No cellular tissue based products placed in the center since last visit: Has Dressing in Place as Prescribed: Yes Has Compression in Place as Prescribed: Yes Pain Present Now: No Electronic Signature(s) Signed: 07/23/2022 11:39:38 AM By: Matthew Coria RN Entered By: [name] on 07/23/2022 11:39:37 -------------------------------------------------------------------------------- Clinic Level of Care Assessment Details Patient Name: Date of Service: [name] 07/23/2022 11:15 A M Medical Record Number: [id number] Patient Account Number: [id number] Date of Birth/Sex: Treating RN: June 13, 1956 (67 y.o. [name]) [name] Primary Care [name]: [name] Other Clinician: Referring [name]: Treating [name]/Extender: [name] in Treatment: 1 Clinic Level of Care  Assessment Items TOOL 1 Quantity Score []$  - 0 Use when EandM and Procedure is performed on INITIAL visit ASSESSMENTS - Nursing Assessment / Reassessment []$  - 0 General Physical Exam (combine w/ comprehensive assessment (listed just below) when performed on new pt. evals) []$  - 0 Comprehensive Assessment (HX, ROS, Risk Assessments, Wounds Hx, etc.) [name], [name] ([id number]) [phone].pdf Page 2 of 10 ASSESSMENTS - Wound and Skin Assessment / Reassessment []$  - 0 Dermatologic / Skin Assessment (not related to wound area) ASSESSMENTS - Ostomy and/or Continence Assessment and Care []$  - 0 Incontinence Assessment and Management []$  - 0 Ostomy Care Assessment and Management (repouching, etc.) PROCESS - Coordination of Care []$  - 0 Simple Patient / Family Education for ongoing care []$  - 0 Complex (extensive) Patient / Family Education for ongoing care []$  - 0 Staff obtains Programmer, systems, Records, T Results / Process Orders est []$  - 0 Staff telephones HHA, Nursing Homes / Clarify orders / etc []$  - 0 Routine Transfer to another Facility (non-emergent condition) []$  - 0 Routine Hospital Admission (non-emergent condition) []$  - 0 New Admissions / Biomedical engineer / Ordering NPWT Apligraf, etc. , []$  - 0 Emergency Hospital Admission (emergent condition) PROCESS - Special Needs []$  - 0 Pediatric / Minor Patient Management []$  - 0 Isolation Patient Management []$  - 0 Hearing / Language / Visual special needs []$  - 0 Assessment of Community assistance (transportation, D/C planning, etc.) []$  - 0 Additional assistance / Altered mentation []$  - 0 Support Surface(s) Assessment (bed, cushion, seat, etc.) INTERVENTIONS - Miscellaneous []$  - 0 External ear exam []$  - 0 Patient Transfer (multiple staff / Civil Service fast streamer / Similar devices) []$  - 0 Simple Staple / Suture removal (25 or less) []$  - 0 Complex Staple / Suture removal (26 or more) []$  - 0 Hypo/Hyperglycemic  Management (do not check if billed separately) []$  - 0 Ankle / Brachial Index (ABI) - do not check if billed separately Has  the patient been seen at the hospital within the last three years: Yes Total Score: 0 Level Of Care: ____ Electronic Signature(s) Signed: 07/24/2022 12:08:06 PM By: Matthew Coria RN Entered By: [name] on 07/23/2022 12:36:27 -------------------------------------------------------------------------------- Encounter Discharge Information Details Patient Name: Date of Service: Matthew Broom [name] 07/23/2022 11:15 A M Medical Record Number: [id number] Patient Account Number: [id number] Date of Birth/Sex: Treating RN: [birth year] (67 y.o. [name] Primary Care Raechell Singleton: [name] Other Clinician: Referring [name]: Treating [name]/Extender: [name] ([id number]) [id number] Page 3 of 10 Weeks in Treatment: 1 Encounter Discharge Information Items Post Procedure Vitals Discharge Condition: Stable Temperature (F): 98 Ambulatory Status: Cane Pulse (bpm): 80 Discharge Destination: Home Respiratory Rate (breaths/min): 16 Transportation: Private Auto Blood Pressure (mmHg): 135/60 Accompanied By: friend Schedule Follow-up Appointment: Yes Clinical Summary of Care: Electronic Signature(s) Signed: 07/23/2022 12:40:14 PM By: Matthew Coria RN Entered By: [name] on 07/23/2022 12:40:14 -------------------------------------------------------------------------------- Lower Extremity Assessment Details Patient Name: Date of Service: [name] 07/23/2022 11:15 A M Medical Record Number: [id number] Patient Account Number: [id number] Date of Birth/Sex: Treating RN: February 20, 1956 (67 y.o. [name]) [name] Primary Care Ciela Mahajan: [name] Other Clinician: Referring [name]: Treating Jamilee [name]/Extender: [name] in Treatment: 1 Edema Assessment Assessed: [Left: No]  [Right: No] Edema: [Left: Yes] [Right: Yes] Calf Left: Right: Point of Measurement: 35 cm From Medial Instep 34 cm 60 cm Ankle Left: Right: Point of Measurement: 10 cm From Medial Instep 32 cm 32 cm Knee To Floor Left: Right: From Medial Instep 50 cm 50 cm Vascular Assessment Pulses: Dorsalis Pedis Palpable: [Left:Yes] [Right:Yes] Electronic Signature(s) Signed: 07/23/2022 12:36:14 PM By: Matthew Coria RN Entered By: [name] on 07/23/2022 12:36:14 [name] ([id number]) [id number] Page 4 of 10 -------------------------------------------------------------------------------- Multi Wound Chart Details Patient Name: Date of Service: [name] 07/23/2022 11:15 A M Medical Record Number: [id number] Patient Account Number: [id number] Date of Birth/Sex: Treating RN: 02/13/1956 (66 y.o. [name]) [name] Primary Care Lyndzee Kliebert: [name] Other Clinician: Referring Caidence Kaseman: Treating Jannice Beitzel/Extender: [name] in Treatment: 1 Vital Signs Height(in): 73 Pulse(bpm): 80 Weight(lbs): 358 Blood Pressure(mmHg): 135/60 Body Mass Index(BMI): 47.2 Temperature(F): 98 Respiratory Rate(breaths/min): 16 [Treatment Notes:Wound Assessments Treatment Notes] Electronic Signature(s) Signed: 07/24/2022 12:08:06 PM By: Matthew Coria RN Entered By: [name] on 07/23/2022 11:40:49 -------------------------------------------------------------------------------- Multi-Disciplinary Care Plan Details Patient Name: Date of Service: Matthew Broom [name] 07/23/2022 11:15 A M Medical Record Number: [id number] Patient Account Number: [id number] Date of Birth/Sex: Treating RN: 1956/02/07 (66 y.o. [name]) [name] Primary Care Kashmere Staffa: [name] Other Clinician: Referring Endiya Klahr: Treating Anayiah Howden/Extender: [name] in Treatment: 1 Active Inactive Necrotic Tissue Nursing Diagnoses: Knowledge deficit  related to management of necrotic/devitalized tissue Goals: Necrotic/devitalized tissue will be minimized in the wound bed Date Initiated: 07/16/2022 Target Resolution Date: 08/14/2022 Goal Status: Active Patient/caregiver will verbalize understanding of reason and process for debridement of necrotic tissue Date Initiated: 07/16/2022 Target Resolution Date: 08/14/2022 Goal Status: Active [name], [name] ([id number]) [phone].pdf Page 5 of 10 Interventions: Assess patient pain level pre-, during and post procedure and prior to discharge Provide education on necrotic tissue and debridement process Notes: Wound/Skin Impairment Nursing Diagnoses: Knowledge deficit related to ulceration/compromised skin integrity Goals: Patient/caregiver will verbalize understanding of skin care regimen Date Initiated: 07/16/2022 Target Resolution Date: 08/14/2022 Goal Status: Active Ulcer/skin breakdown will have a volume reduction of 30% by week 4 Date Initiated:  07/16/2022 Target Resolution Date: 08/14/2022 Goal Status: Active Ulcer/skin breakdown will have a volume reduction of 50% by week 8 Date Initiated: 07/16/2022 Target Resolution Date: 09/14/2022 Goal Status: Active Ulcer/skin breakdown will have a volume reduction of 80% by week 12 Date Initiated: 07/16/2022 Target Resolution Date: 10/14/2022 Goal Status: Active Ulcer/skin breakdown will heal within 14 weeks Date Initiated: 07/16/2022 Target Resolution Date: 11/14/2022 Goal Status: Active Interventions: Assess patient/caregiver ability to obtain necessary supplies Assess patient/caregiver ability to perform ulcer/skin care regimen upon admission and as needed Assess ulceration(s) every visit Notes: Electronic Signature(s) Signed: 07/23/2022 12:39:13 PM By: Matthew Coria RN Entered By: [name] on 07/23/2022 12:39:12 -------------------------------------------------------------------------------- Pain Assessment Details Patient  Name: Date of Service: Matthew Broom [name] 07/23/2022 11:15 A M Medical Record Number: [id number] Patient Account Number: [id number] Date of Birth/Sex: Treating RN: 09/10/1955 (66 y.o. [name] Primary Care Nakeem Murnane: [name] Other Clinician: Referring Juliahna Wiswell: Treating Emily Massar/Extender: [name] in Treatment: 1 Active Problems Location of Pain Severity and Description of Pain Patient Has Paino No Site Locations [name], [name] ([id number]) [phone].pdf Page 6 of 10 Pain Management and Medication Current Pain Management: Electronic Signature(s) Signed: 07/23/2022 12:36:43 PM By: Matthew Coria RN Entered By: [name] on 07/23/2022 12:36:42 -------------------------------------------------------------------------------- Patient/Caregiver Education Details Patient Name: Date of Service: [name] 2/8/2024andnbsp11:15 A M Medical Record Number: [id number] Patient Account Number: [id number] Date of Birth/Gender: Treating RN: 1956/06/11 (66 y.o. [name] Primary Care Physician: [name] Other Clinician: Referring Physician: Treating Physician/Extender: [name] in Treatment: 1 Education Assessment Education Provided To: Patient Education Topics Provided Wound/Skin Impairment: Methods: Explain/Verbal Responses: State content correctly Electronic Signature(s) Signed: 07/24/2022 12:08:06 PM By: Matthew Coria RN Entered By: [name] on 07/23/2022 12:39:02 [name] ([id number].pdf Page 7 of 10 -------------------------------------------------------------------------------- Wound Assessment Details Patient Name: Date of Service: [name] 07/23/2022 11:15 A M Medical Record Number: [id number] Patient Account Number: [id number] Date of Birth/Sex: Treating RN: Apr 29, 1956 (66 y.o. [name]) [name] Primary Care Aunica Dauphinee: [name] Other Clinician: Referring Kyrin Garn: Treating [name]/Extender: [name] in Treatment: 1 Wound Status Wound Number: 1 Primary Diabetic Wound/Ulcer of the Lower Extremity Etiology: Wound Location: Right, Anterior Lower Leg Wound Status: Open Wounding Event: Gradually Appeared Comorbid Arrhythmia, Congestive Heart Failure, Hypertension, Type II Date Acquired: 06/15/2022 History: Diabetes Weeks Of Treatment: 1 Clustered Wound: No Photos Wound Measurements Length: (cm) 5 Width: (cm) 5 Depth: (cm) 0.1 Area: (cm) 19.635 Volume: (cm) 1.963 % Reduction in Area: 64.9% % Reduction in Volume: 64.9% Epithelialization: None Tunneling: No Undermining: No Wound Description Classification: Grade 1 Exudate Amount: Medium Exudate Type: Serosanguineous Exudate Color: red, brown Foul Odor After Cleansing: No Slough/Fibrino Yes Wound Bed Granulation Amount: Medium (34-66%) Exposed Structure Granulation Quality: Red Fascia Exposed: No Necrotic Amount: Medium (34-66%) Fat Layer (Subcutaneous Tissue) Exposed: Yes Tendon Exposed: No Muscle Exposed: No Joint Exposed: No Bone Exposed: No Treatment Notes Wound #1 (Lower Leg) Wound Laterality: Right, Anterior Cleanser Soap and Water Discharge Instruction: Gently cleanse wound with antibacterial soap, rinse and pat dry prior to dressing wounds Peri-Wound Care [name], [name] ([id number]) [id number] Page 8 of 10 Topical Primary Dressing AquacelAg Advantage Dressing, 4x5 (in/in) Discharge Instruction: Apply to wound as directed Secondary Dressing ABD Pad 5x9 (in/in) Discharge Instruction: Cover with ABD pad Secured With Compression Wrap Medichoice 4 layer Compression System, 35-40 mmHG Discharge Instruction: Apply multi-layer wrap as directed. Compression Stockings Add-Ons  Electronic Signature(s) Signed: 07/23/2022 12:38:29 PM By: Matthew Coria RN Entered By: [name] on  07/23/2022 12:38:29 -------------------------------------------------------------------------------- Wound Assessment Details Patient Name: Date of Service: Matthew Broom [name] 07/23/2022 11:15 A M Medical Record Number: [id number] Patient Account Number: [id number] Date of Birth/Sex: Treating RN: 1956-01-03 (66 y.o. [name]) [name] Primary Care Janalee Grobe: [name] Other Clinician: Referring Raiquan Chandler: Treating Kaeden Depaz/Extender: [name] in Treatment: 1 Wound Status Wound Number: 2 Primary Diabetic Wound/Ulcer of the Lower Extremity Etiology: Wound Location: Left, Circumferential Lower Leg Wound Status: Open Wounding Event: Gradually Appeared Comorbid Arrhythmia, Congestive Heart Failure, Hypertension, Type II Date Acquired: 06/15/2022 History: Diabetes Weeks Of Treatment: 1 Clustered Wound: No Photos Wound Measurements Length: (cm) 12 Width: (cm) 42 Depth: (cm) 0.1 Area: (cm) 395.841 Volume: (cm) 39.584 [name], [name] ([id number]) % Reduction in Area: 34.1% % Reduction in Volume: 34.1% Epithelialization: None Tunneling: No Undermining: No [id number] Page 9 of 10 Wound Description Classification: Grade 1 Exudate Amount: Medium Exudate Type: Serosanguineous Exudate Color: red, brown Foul Odor After Cleansing: No Slough/Fibrino Yes Wound Bed Granulation Amount: Medium (34-66%) Exposed Structure Granulation Quality: Red Fascia Exposed: No Necrotic Amount: Medium (34-66%) Fat Layer (Subcutaneous Tissue) Exposed: Yes Tendon Exposed: No Muscle Exposed: No Joint Exposed: No Bone Exposed: No Treatment Notes Wound #2 (Lower Leg) Wound Laterality: Left, Circumferential Cleanser Soap and Water Discharge Instruction: Gently cleanse wound with antibacterial soap, rinse and pat dry prior to dressing wounds Peri-Wound Care Topical Primary Dressing AquacelAg Advantage Dressing, 4x5 (in/in) Discharge Instruction:  Apply to wound as directed Secondary Dressing ABD Pad 5x9 (in/in) Discharge Instruction: Cover with ABD pad Secured With Compression Wrap Medichoice 4 layer Compression System, 35-40 mmHG Discharge Instruction: Apply multi-layer wrap as directed. Compression Stockings Add-Ons Electronic Signature(s) Signed: 07/23/2022 12:38:44 PM By: Matthew Coria RN Entered By: [name] on 07/23/2022 12:38:44 -------------------------------------------------------------------------------- Vitals Details Patient Name: Date of Service: [name], Matthew Horn. 07/23/2022 11:15 A M Medical Record Number: [id number] Patient Account Number: [id number] Date of Birth/Sex: Treating RN: [birth year] (66 y.o. [name] Primary Care Shawan Corella: [name] Other Clinician: Referring Laurens Matheny: Treating Kebin Maye/Extender: [name] in Treatment: 1 Vital Signs Time Taken: 11:39 Temperature (F): [address] [place], [name] ([id number]) [phone].pdf Page 10 of 10 Height (in): 73 Pulse (bpm): 80 Weight (lbs): 358 Respiratory Rate (breaths/min): 16 Body Mass Index (BMI): 47.2 Blood Pressure (mmHg): 135/60 Reference Range: 80 - 120 mg / dl Electronic Signature(s) Signed: 07/23/2022 11:40:07 AM By: Matthew Coria RN Entered By: [name] on 07/23/2022 11:40:07

## 2022-07-23 NOTE — Progress Notes (Addendum)
KEYVON, NOVACEK ([id number]) [id number] Page 1 of 9 Visit Report for 07/23/2022 Chief Complaint Document Details Patient Name: Date of Service: Rutherford Limerick 07/23/2022 11:15 A M Medical Record Number: [id number] Patient Account Number: [id number] Date of Birth/Sex: Treating RN: Dec 19, 1955 (66 y.o. [name]) [name] Primary Care Provider: [name] Other Clinician: Referring Provider: Treating Provider/Extender: [name] in Treatment: 1 Information Obtained from: Patient Chief Complaint Bilateral LE ulcers and lymphedema Electronic Signature(s) Signed: 07/23/2022 11:25:04 AM By: Worthy Keeler PA-C Entered By: Worthy Keeler on 07/23/2022 11:25:04 -------------------------------------------------------------------------------- Debridement Details Patient Name: Date of Service: [name] 07/23/2022 11:15 A M Medical Record Number: [id number] Patient Account Number: [id number] Date of Birth/Sex: Treating RN: January 21, 1956 (66 y.o. [name]) [name] Primary Care Provider: [name] Other Clinician: Referring Provider: Treating Provider/Extender: [name] in Treatment: 1 Debridement Performed for Assessment: Wound #1 Right,Anterior Lower Leg Performed By: Physician Tommie Sams., PA-C Debridement Type: Debridement Severity of Tissue Pre Debridement: Fat layer exposed Level of Consciousness (Pre-procedure): Awake and Alert Pre-procedure Verification/Time Out Yes - 11:40 Taken: Start Time: 11:40 T Area Debrided (L x W): otal 2 (cm) x 4 (cm) = 8 (cm) Tissue and other material debrided: Viable, Non-Viable, Slough, Subcutaneous, Slough Level: Skin/Subcutaneous Tissue Debridement Description: Excisional Instrument: Curette Bleeding: Minimum Hemostasis Achieved: Pressure End Time: 11:48 Procedural Pain: 0 Post Procedural Pain: 0 Response to Treatment: Procedure was tolerated well CHANEY, SY ([id number]) [id number] Page 2 of 9 Level of Consciousness (Post- Awake and Alert procedure): Post Debridement Measurements of Total Wound Length: (cm) 5 Width: (cm) 5 Depth: (cm) 0.1 Volume: (cm) 1.963 Character of Wound/Ulcer Post Debridement: Improved Severity of Tissue Post Debridement: Fat layer exposed Post Procedure Diagnosis Same as Pre-procedure Electronic Signature(s) Signed: 07/23/2022 6:29:11 PM By: Worthy Keeler PA-C Signed: 07/24/2022 12:08:06 PM By: [name] RN Entered By: [name] on 07/23/2022 11:45:18 -------------------------------------------------------------------------------- Debridement Details Patient Name: Date of Service: [name] 07/23/2022 11:15 A M Medical Record Number: [id number] Patient Account Number: [id number] Date of Birth/Sex: Treating RN: [birth year] (66 y.o. [name]) [name] Primary Care Provider: [name] Other Clinician: Referring Provider: Treating Provider/Extender: [name] in Treatment: 1 Debridement Performed for Assessment: Wound #2 Left,Circumferential Lower Leg Performed By: Physician Tommie Sams., PA-C Debridement Type: Debridement Severity of Tissue Pre Debridement: Fat layer exposed Level of Consciousness (Pre-procedure): Awake and Alert Pre-procedure Verification/Time Out Yes - 11:40 Taken: Start Time: 11:40 T Area Debrided (L x W): otal 5.5 (cm) x 5.5 (cm) = 30.25 (cm) Tissue and other material debrided: Viable, Non-Viable, Slough, Subcutaneous, Biofilm, Slough Level: Skin/Subcutaneous Tissue Debridement Description: Excisional Instrument: Curette Bleeding: Minimum Hemostasis Achieved: Pressure End Time: 11:48 Procedural Pain: 0 Post Procedural Pain: 0 Response to Treatment: Procedure was tolerated well Level of Consciousness (Post- Awake and Alert procedure): Post Debridement Measurements of Total Wound Length: (cm) 12 Width:  (cm) 37 Depth: (cm) 0.2 Volume: (cm) 69.743 Character of Wound/Ulcer Post Debridement: Improved Severity of Tissue Post Debridement: Fat layer exposed Post Procedure Diagnosis Same as Pre-procedure JADEIN, THALMAN ([id number]) [id number] Page 3 of 9 Electronic Signature(s) Signed: 07/23/2022 6:29:11 PM By: Worthy Keeler PA-C Signed: 07/24/2022 12:08:06 PM By: [name] RN Entered By: [name] on 07/23/2022 11:46:59 -------------------------------------------------------------------------------- HPI Details Patient Name: Date of Service: [name], [place]. 07/23/2022 11:15 A M Medical Record Number: [id number] Patient Account Number: [id number] Date of Birth/Sex:  Treating RN: 13-Nov-1955 (66 y.o. [name]) [name] Primary Care Provider: [name] Other Clinician: Referring Provider: Treating Provider/Extender: [name] in Treatment: 1 History of Present Illness HPI Description: 07-16-2022 upon evaluation today patient appears to be doing poorly currently in regard to his his legs. The left leg is actually worse than the right. This is a patient that is previously been seen in the [place] office and at that point was doing really quite well with compression wrapping and often alginate are either Hydrofera Blue dressings. Fortunately he has gone since November 2020 until just current with out having any issue or need for wound care services which is great news. Unfortunately he is here today because he is having some issues at this point. Patient does have a history of several medical conditions which include diabetes mellitus type 2, lymphedema, hypertension, congestive heart failure, and cardiac arrhythmia though is not sure that this is actually atrial fibrillation based on what he has been told. He does have lymphedema pumps but tells me he has not used them since the summer when he had to move to a remodel of his building and  subsequently never unpacked them. 07-23-2022 upon evaluation today patient appears to be doing well with regard to his legs. He is going require some sharp debridement today but overall seems to be making some progress. I am pleased in that regard he has much less drainage that he had did last week I think the compression wraps are definitely helping. Electronic Signature(s) Signed: 07/23/2022 12:46:13 PM By: Worthy Keeler PA-C Entered By: Worthy Keeler on 07/23/2022 12:46:13 -------------------------------------------------------------------------------- Physical Exam Details Patient Name: Date of Service: Rutherford L[place] 07/23/2022 11:15 A M Medical Record Number: [id number] Patient Account Number: [id number] Date of Birth/Sex: Treating RN: 03-Aug-1955 (66 y.o. Oval Linsey Primary Care Provider: [name] Other Clinician: Referring Provider: Treating Provider/Extender: [name] in Treatment: 1 Constitutional Obese and well-hydrated in no acute distress. Respiratory normal breathing without difficulty. YUNIOR, BOXX ([id number]) [id number] Page 4 of 9 Psychiatric this patient is able to make decisions and demonstrates good insight into disease process. Alert and Oriented x 3. pleasant and cooperative. Notes Upon inspection patient's wound bed actually showed signs at all locations of some need for sharp debridement I did perform debridement today to clear away some of the necrotic debris and he tolerated this debridement without complication. Postdebridement wound bed is significantly improved which is great news and overall I am extremely pleased with where things stand currently. Electronic Signature(s) Signed: 07/23/2022 12:46:28 PM By: Worthy Keeler PA-C Entered By: Worthy Keeler on 07/23/2022 12:46:27 -------------------------------------------------------------------------------- Physician Orders  Details Patient Name: Date of Service: [name], [place]. 07/23/2022 11:15 A M Medical Record Number: [id number] Patient Account Number: [id number] Date of Birth/Sex: Treating RN: August 02, 1955 (66 y.o. [name]) [name] Primary Care Provider: [name] Other Clinician: Referring Provider: Treating Provider/Extender: [name] in Treatment: 1 Verbal / Phone Orders: No Diagnosis Coding ICD-10 Coding Code Description E11.622 Type 2 diabetes mellitus with other skin ulcer I89.0 Lymphedema, not elsewhere classified L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 L97.812 Non-pressure chronic ulcer of other part of right lower leg with fat layer exposed I10 Essential (primary) hypertension I50.42 Chronic combined systolic (congestive) and diastolic (congestive) heart failure I49.9 Cardiac arrhythmia, unspecified Follow-up Appointments Return Appointment in 1 week. Bathing/ Shower/ Hygiene May shower  with wound dressing protected with water repellent cover or cast protector. Anesthetic (Use 'Patient Medications' Section for Anesthetic Order Entry) Lidocaine applied to wound bed Edema Control - Lymphedema / Segmental Compressive Device / Other Elevate, Exercise Daily and A void Standing for Long Periods of Time. Elevate legs to the level of the heart and pump ankles as often as possible Elevate leg(s) parallel to the floor when sitting. Compression Pump: Use compression pump on left lower extremity for 60 minutes, twice daily. Compression Pump: Use compression pump on right lower extremity for 60 minutes, twice daily. Wound Treatment Wound #1 - Lower Leg Wound Laterality: Right, Anterior Cleanser: Soap and Water 1 x Per Week/30 Days Discharge Instructions: Gently cleanse wound with antibacterial soap, rinse and pat dry prior to dressing wounds Prim Dressing: AquacelAg Advantage Dressing, 4x5 (in/in) 1 x Per Week/30  Days ary Discharge Instructions: Apply to wound as directed Secondary Dressing: ABD Pad 5x9 (in/in) 1 x Per Week/30 Days Discharge Instructions: Cover with ABD pad ROBERTS, LARRALDE ([id number]) [id number] Page 5 of 9 Compression Wrap: Medichoice 4 layer Compression System, 35-40 mmHG 1 x Per Week/30 Days Discharge Instructions: Apply multi-layer wrap as directed. Wound #2 - Lower Leg Wound Laterality: Left, Circumferential Cleanser: Soap and Water 1 x Per Week/30 Days Discharge Instructions: Gently cleanse wound with antibacterial soap, rinse and pat dry prior to dressing wounds Prim Dressing: AquacelAg Advantage Dressing, 4x5 (in/in) 1 x Per Week/30 Days ary Discharge Instructions: Apply to wound as directed Secondary Dressing: ABD Pad 5x9 (in/in) 1 x Per Week/30 Days Discharge Instructions: Cover with ABD pad Compression Wrap: Medichoice 4 layer Compression System, 35-40 mmHG 1 x Per Week/30 Days Discharge Instructions: Apply multi-layer wrap as directed. Electronic Signature(s) Signed: 07/23/2022 6:29:11 PM By: Worthy Keeler PA-C Signed: 07/24/2022 12:08:06 PM By: [name] RN Entered By: [name] on 07/23/2022 11:49:03 -------------------------------------------------------------------------------- Problem List Details Patient Name: Date of Service: [name] 07/23/2022 11:15 A M Medical Record Number: [id number] Patient Account Number: [id number] Date of Birth/Sex: Treating RN: 09/01/1955 (66 y.o. [name]) [name] Primary Care Provider: [name] Other Clinician: Referring Provider: Treating Provider/Extender: [name] in Treatment: 1 Active Problems ICD-10 Encounter Code Description Active Date MDM Diagnosis E11.622 Type 2 diabetes mellitus with other skin ulcer 07/16/2022 No Yes I89.0 Lymphedema, not elsewhere classified 07/16/2022 No Yes L97.822 Non-pressure chronic ulcer of other part of left lower leg  with fat layer exposed2/06/2022 No Yes L97.812 Non-pressure chronic ulcer of other part of right lower leg with fat layer 07/16/2022 No Yes exposed [place] (primary) hypertension 07/16/2022 No Yes I50.42 Chronic combined systolic (congestive) and diastolic (congestive) heart failure 07/16/2022 No Yes JIBRI, FOURAKER ([id number]) [id number] Page 6 of 9 I49.9 Cardiac arrhythmia, unspecified 07/16/2022 No Yes Inactive Problems Resolved Problems Electronic Signature(s) Signed: 07/23/2022 11:25:00 AM By: Worthy Keeler PA-C Entered By: Worthy Keeler on 07/23/2022 11:25:00 -------------------------------------------------------------------------------- Progress Note Details Patient Name: Date of Service: [name], [name] 07/23/2022 11:15 A M Medical Record Number: [id number] Patient Account Number: [id number] Date of Birth/Sex: Treating RN: 10/04/1955 (66 y.o. [name]) [name] Primary Care Provider: [name] Other Clinician: Referring Provider: Treating Provider/Extender: [name] in Treatment: 1 Subjective Chief Complaint Information obtained from Patient Bilateral LE ulcers and lymphedema History of Present Illness (HPI) 07-16-2022 upon evaluation today patient appears to be doing poorly currently in regard to his his legs. The left leg is actually worse than the right.  This is a patient that is previously been seen in the [place] office and at that point was doing really quite well with compression wrapping and often alginate are either Hydrofera Blue dressings. Fortunately he has gone since November 2020 until just current with out having any issue or need for wound care services which is great news. Unfortunately he is here today because he is having some issues at this point. Patient does have a history of several medical conditions which include diabetes mellitus type 2, lymphedema, hypertension, congestive heart failure,  and cardiac arrhythmia though is not sure that this is actually atrial fibrillation based on what he has been told. He does have lymphedema pumps but tells me he has not used them since the summer when he had to move to a remodel of his building and subsequently never unpacked them. 07-23-2022 upon evaluation today patient appears to be doing well with regard to his legs. He is going require some sharp debridement today but overall seems to be making some progress. I am pleased in that regard he has much less drainage that he had did last week I think the compression wraps are definitely helping. Objective Constitutional Obese and well-hydrated in no acute distress. Vitals Time Taken: 11:39 AM, Height: 73 in, Weight: 358 lbs, BMI: 47.2, Temperature: 98 F, Pulse: 80 bpm, Respiratory Rate: 16 breaths/min, Blood Pressure: 135/60 mmHg. Respiratory normal breathing without difficulty. Psychiatric MCKALE, WINBORN ([id number]) [id number] Page 7 of 9 this patient is able to make decisions and demonstrates good insight into disease process. Alert and Oriented x 3. pleasant and cooperative. General Notes: Upon inspection patient's wound bed actually showed signs at all locations of some need for sharp debridement I did perform debridement today to clear away some of the necrotic debris and he tolerated this debridement without complication. Postdebridement wound bed is significantly improved which is great news and overall I am extremely pleased with where things stand currently. Integumentary (Hair, Skin) Wound #1 status is Open. Original cause of wound was Gradually Appeared. The date acquired was: 06/15/2022. The wound has been in treatment 1 weeks. The wound is located on the Right,Anterior Lower Leg. The wound measures 5cm length x 5cm width x 0.1cm depth; 19.635cm^2 area and 1.963cm^3 volume. There is Fat Layer (Subcutaneous Tissue) exposed. There is no tunneling or  undermining noted. There is a medium amount of serosanguineous drainage noted. There is medium (34-66%) red granulation within the wound bed. There is a medium (34-66%) amount of necrotic tissue within the wound bed. Wound #2 status is Open. Original cause of wound was Gradually Appeared. The date acquired was: 06/15/2022. The wound has been in treatment 1 weeks. The wound is located on the Left,Circumferential Lower Leg. The wound measures 12cm length x 42cm width x 0.1cm depth; 395.841cm^2 area and 39.584cm^3 volume. There is Fat Layer (Subcutaneous Tissue) exposed. There is no tunneling or undermining noted. There is a medium amount of serosanguineous drainage noted. There is medium (34-66%) red granulation within the wound bed. There is a medium (34-66%) amount of necrotic tissue within the wound bed. Assessment Active Problems ICD-10 Type 2 diabetes mellitus with other skin ulcer Lymphedema, not elsewhere classified Non-pressure chronic ulcer of other part of left lower leg with fat layer exposed Non-pressure chronic ulcer of other part of right lower leg with fat layer exposed Essential (primary) hypertension Chronic combined systolic (congestive) and diastolic (congestive) heart failure Cardiac arrhythmia, unspecified Procedures Wound #1 Pre-procedure diagnosis of Wound #1 is a Diabetic  Wound/Ulcer of the Lower Extremity located on the Right,Anterior Lower Leg .Severity of Tissue Pre Debridement is: Fat layer exposed. There was a Excisional Skin/Subcutaneous Tissue Debridement with a total area of 8 sq cm performed by Tommie Sams., PA-C. With the following instrument(s): Curette to remove Viable and Non-Viable tissue/material. Material removed includes Subcutaneous Tissue and Slough and. No specimens were taken. A time out was conducted at 11:40, prior to the start of the procedure. A Minimum amount of bleeding was controlled with Pressure. The procedure was tolerated well with a pain  level of 0 throughout and a pain level of 0 following the procedure. Post Debridement Measurements: 5cm length x 5cm width x 0.1cm depth; 1.963cm^3 volume. Character of Wound/Ulcer Post Debridement is improved. Severity of Tissue Post Debridement is: Fat layer exposed. Post procedure Diagnosis Wound #1: Same as Pre-Procedure Wound #2 Pre-procedure diagnosis of Wound #2 is a Diabetic Wound/Ulcer of the Lower Extremity located on the Left,Circumferential Lower Leg .Severity of Tissue Pre Debridement is: Fat layer exposed. There was a Excisional Skin/Subcutaneous Tissue Debridement with a total area of 30.25 sq cm performed by Tommie Sams., PA-C. With the following instrument(s): Curette to remove Viable and Non-Viable tissue/material. Material removed includes Subcutaneous Tissue, Slough, and Biofilm. No specimens were taken. A time out was conducted at 11:40, prior to the start of the procedure. A Minimum amount of bleeding was controlled with Pressure. The procedure was tolerated well with a pain level of 0 throughout and a pain level of 0 following the procedure. Post Debridement Measurements: 12cm length x 37cm width x 0.2cm depth; 69.743cm^3 volume. Character of Wound/Ulcer Post Debridement is improved. Severity of Tissue Post Debridement is: Fat layer exposed. Post procedure Diagnosis Wound #2: Same as Pre-Procedure Plan Follow-up Appointments: Return Appointment in 1 week. Bathing/ Shower/ Hygiene: May shower with wound dressing protected with water repellent cover or cast protector. Anesthetic (Use 'Patient Medications' Section for Anesthetic Order Entry): Lidocaine applied to wound bed Edema Control - Lymphedema / Segmental Compressive Device / Other: Elevate, Exercise Daily and Avoid Standing for Long Periods of Time. Elevate legs to the level of the heart and pump ankles as often as possible Elevate leg(s) parallel to the floor when sitting. Compression Pump: Use compression pump  on left lower extremity for 60 minutes, twice daily. Compression Pump: Use compression pump on right lower extremity for 60 minutes, twice daily. WOUND #1: - Lower Leg Wound Laterality: Right, Anterior Cleanser: Soap and Water 1 x Per Week/30 Days Discharge Instructions: Gently cleanse wound with antibacterial soap, rinse and pat dry prior to dressing wounds Prim Dressing: AquacelAg Advantage Dressing, 4x5 (in/in) 1 x Per Week/30 Days ary Discharge Instructions: Apply to wound as directed Secondary Dressing: ABD Pad 5x9 (in/in) 1 x Per Week/30 Days Discharge Instructions: Cover with ABD pad DONTREY, SIKORSKI ([id number]) [id number] Page 8 of 9 Com pression Wrap: Medichoice 4 layer Compression System, 35-40 mmHG 1 x Per Week/30 Days Discharge Instructions: Apply multi-layer wrap as directed. WOUND #2: - Lower Leg Wound Laterality: Left, Circumferential Cleanser: Soap and Water 1 x Per Week/30 Days Discharge Instructions: Gently cleanse wound with antibacterial soap, rinse and pat dry prior to dressing wounds Prim Dressing: AquacelAg Advantage Dressing, 4x5 (in/in) 1 x Per Week/30 Days ary Discharge Instructions: Apply to wound as directed Secondary Dressing: ABD Pad 5x9 (in/in) 1 x Per Week/30 Days Discharge Instructions: Cover with ABD pad Com pression Wrap: Medichoice 4 layer Compression System, 35-40 mmHG 1 x Per Week/30 Days  Discharge Instructions: Apply multi-layer wrap as directed. 1. I am going to recommend currently that we have the patient continue to monitor for any signs of infection or worsening he feels pretty good with the compression wraps and I think he is doing a good job. As far as that is concerned. We will therefore continue with the Unna boot wraps. 2. I am also can recommend we continue with the Aquacel Ag which seems to be doing a good job as well as the ABD pads to cover and then the 4-layer compression wrap. We will see patient back for  reevaluation in 1 week here in the clinic. If anything worsens or changes patient will contact our office for additional recommendations. Electronic Signature(s) Signed: 07/23/2022 12:46:48 PM By: Worthy Keeler PA-C Entered By: Worthy Keeler on 07/23/2022 12:46:48 -------------------------------------------------------------------------------- SuperBill Details Patient Name: Date of Service: Rutherford [place]imerick 07/23/2022 Medical Record Number: [id number] Patient Account Number: [id number] Date of Birth/Sex: Treating RN: 1955-07-18 (66 y.o. [name]) [name] Primary Care Provider: [name] Other Clinician: Referring Provider: Treating Provider/Extender: [name] in Treatment: 1 Diagnosis Coding ICD-10 Codes Code Description [id number] Type 2 diabetes mellitus with other skin ulcer I89.0 Lymphedema, not elsewhere classified L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 L97.812 Non-pressure chronic ulcer of other part of right lower leg with fat layer exposed I10 Essential (primary) hypertension I50.42 Chronic combined systolic (congestive) and diastolic (congestive) heart failure I49.9 Cardiac arrhythmia, unspecified Facility Procedures : CPT4 Code: [id number] Description: [facility] - DEB SUBQ TISSUE 20 SQ CM/< ICD-10 Diagnosis Description L97.822 Non-pressure chronic ulcer of other part of left lower leg with fat layer expos L97.812 Non-pressure chronic ulcer of other part of right lower leg with fat layer expo Modifier: ed sed Quantity: 1 Physician Procedures : CPT4 Code Description Modifier [id number] - WC PHYS SUBQ TISS 20 SQ CM ICD-10 Diagnosis Description L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 L97.812 Non-pressure chronic ulcer of other part of right  lower leg with fat layer exposed Quantity: 1 : [id number] - WC PHYS SUBQ TISS EA ADDL 20 CM ICD-10 Diagnosis  Description L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 L97.812 Non-pressure chronic ulcer of other part of right lower leg with fat layer  exposed Quantity: 1 Electronic Signature(s) Signed: 07/23/2022 12:47:07 PM By: Worthy Keeler PA-C Entered By: Worthy Keeler on 07/23/2022 12:47:07

## 2022-07-29 ENCOUNTER — Telehealth: Payer: Self-pay | Admitting: Internal Medicine

## 2022-07-29 NOTE — Telephone Encounter (Signed)
Advanced Heart Failure Patient Advocate Encounter  [place] to provide grant billing information, they have confirmed $0 copay. Updated patient and he will call to finalize delivery scheduling for this medication. No further issues at this time.

## 2022-07-29 NOTE — Telephone Encounter (Signed)
Advanced Heart Failure Patient Advocate Encounter  Patient reached out, [facility] is having issues processing the grant. Contacted pharmacy directly, provided billing information. They have confirmed a $0 copay and patient needs to call to finalize delivery schedule.  Spoke with patient on the phone to update. Nothing further needed at this time.

## 2022-07-29 NOTE — Telephone Encounter (Signed)
**Note De-Identified Donell Tomkins Obfuscation** Stanton Kidney, RN  Laneah Luft, Deliah Boston, LPN Caller: Unspecified (Today, 10:09 AM) Pt reports he should be getting this free through company, but [facility] needs the approval/bin numbers to proceed.  Pt aware forwarding to [employer] Hawking to address this.   I am not familiar with [facility]. I did read a phone note concerning a [facility] approval in the pts chart from 05/01/2022.  I am forwarding this message to Charleston Poot, CPhT at our [place] office who assisted the pt with his [facility] to see if she can assist him.

## 2022-07-29 NOTE — Telephone Encounter (Signed)
Pt c/o medication issue:  1. Name of Medication:  sacubitril-valsartan (ENTRESTO) 49-51 MG  2. How are you currently taking this medication (dosage and times per day)?   3. Are you having a reaction (difficulty breathing--STAT)?   4. What is your medication issue?   Patient is following up. He states he spoke with [facility] and they are missing the BIN number needed to process his prescription.

## 2022-07-30 ENCOUNTER — Other Ambulatory Visit
Admission: RE | Admit: 2022-07-30 | Discharge: 2022-07-30 | Disposition: A | Discharge status: Home / Self Care | Payer: PPO | Source: Ambulatory Visit | Attending: Physician Assistant | Admitting: Physician Assistant

## 2022-07-30 ENCOUNTER — Encounter: Payer: PPO | Admitting: Physician Assistant

## 2022-07-30 DIAGNOSIS — L97822 Non-pressure chronic ulcer of other part of left lower leg with fat layer exposed: Secondary | ICD-10-CM | POA: Diagnosis not present

## 2022-07-30 DIAGNOSIS — E11622 Type 2 diabetes mellitus with other skin ulcer: Secondary | ICD-10-CM | POA: Diagnosis not present

## 2022-07-30 DIAGNOSIS — L03116 Cellulitis of left lower limb: Secondary | ICD-10-CM | POA: Diagnosis not present

## 2022-07-30 DIAGNOSIS — B9689 Other specified bacterial agents as the cause of diseases classified elsewhere: Secondary | ICD-10-CM | POA: Insufficient documentation

## 2022-07-31 NOTE — Progress Notes (Signed)
[name] ([id number]) [id number] Page 1 of 9 Visit Report for 07/30/2022 Chief Complaint Document Details Patient Name: Date of Service: [name] 07/30/2022 7:45 A M Medical Record Number: [id number] Patient Account Number: [id number] Date of Birth/Sex: Treating RN: [birth year] (67 y.o. [name] Primary Care Provider: Seward [name] Other Clinician: Referring Provider: Treating Provider/Extender: [name] in Treatment: 2 Information Obtained from: Patient Chief Complaint Bilateral LE ulcers and lymphedema Electronic Signature(s) Signed: 07/30/2022 8:14:11 AM By: Worthy Keeler PA-C Entered By: Worthy Keeler on 07/30/2022 08:14:11 -------------------------------------------------------------------------------- Debridement Details Patient Name: Date of Service: [name] [name] 07/30/2022 7:45 A M Medical Record Number: [id number] Patient Account Number: [id number] Date of Birth/Sex: Treating RN: [birth year] (67 y.o. [name] Primary Care Provider: Seward [name] Other Clinician: Referring Provider: Treating Provider/Extender: [name] in Treatment: 2 Debridement Performed for Assessment: Wound #2 Left,Circumferential Lower Leg Performed By: Physician Matthew Sams., PA-C Debridement Type: Debridement Severity of Tissue Pre Debridement: Fat layer exposed Level of Consciousness (Pre-procedure): Awake and Alert Pre-procedure Verification/Time Out No Taken: Start Time: 08:21 T Area Debrided (L x W): otal 4 (cm) x 6 (cm) = 24 (cm) Tissue and other material debrided: Viable, Non-Viable, Slough, Subcutaneous, Biofilm, Slough Level: Skin/Subcutaneous Tissue Debridement Description: Excisional Instrument: Curette Specimen: Swab, Number of Specimens T aken: 1 Bleeding: Minimum Hemostasis Achieved: Pressure Response to Treatment: Procedure was tolerated well Level of  Consciousness Olene FlossCASPAR, [name] ([id number]) [phone].pdf Page 2 of 9 Level of Consciousness (Post- Awake and Alert procedure): Post Debridement Measurements of Total Wound Length: (cm) 4 Width: (cm) 6 Depth: (cm) 0.3 Volume: (cm) 5.655 Character of Wound/Ulcer Post Debridement: Requires Further Debridement Severity of Tissue Post Debridement: Fat layer exposed Post Procedure Diagnosis Same as Pre-procedure Electronic Signature(s) Signed: 07/30/2022 5:39:55 PM By: Worthy Keeler PA-C Signed: 07/31/2022 1:32:16 PM By: Rosalio Loud MSN RN CNS WTA Entered By: Rosalio Loud on 07/30/2022 08:29:41 -------------------------------------------------------------------------------- HPI Details Patient Name: Date of Service: [name], Matthew [name] 07/30/2022 7:45 A M Medical Record Number: [id number] Patient Account Number: [id number] Date of Birth/Sex: Treating RN: Mar 05, 1956 (67 y.o. [name] Primary Care Provider: Seward [name] Other Clinician: Referring Provider: Treating Provider/Extender: [name] in Treatment: 2 History of Present Illness HPI Description: 07-16-2022 upon evaluation today patient appears to be doing poorly currently in regard to his his legs. The left leg is actually worse than the right. This is a patient that is previously been seen in the [place] office and at that point was doing really quite well with compression wrapping and often alginate are either Hydrofera Blue dressings. Fortunately he has gone since November 2020 until just current with out having any issue or need for wound care services which is great news. Unfortunately he is here today because he is having some issues at this point. Patient does have a history of several medical conditions which include diabetes mellitus type 2, lymphedema, hypertension, congestive heart failure, and cardiac arrhythmia though is not sure that this is actually  atrial fibrillation based on what he has been told. He does have lymphedema pumps but tells me he has not used them since the summer when he had to move to a remodel of his building and subsequently never unpacked them. 07-23-2022 upon evaluation today patient appears to be doing well with regard to his legs. He is going require some sharp debridement today but overall seems to  be making some progress. I am pleased in that regard he has much less drainage that he had did last week I think the compression wraps are definitely helping. 07-30-2022 upon evaluation today patient appears to be doing poorly in regard to his legs he actually has blue-green drainage which I think is consistent with Pseudomonas. I believe that we need to address this as soon as possible and I discussed that with the patient today as well. Fortunately I do not see any signs of active infection locally nor systemically at this time which is great news. No fevers, chills, nausea, vomiting, or diarrhea. Electronic Signature(s) Signed: 07/30/2022 10:55:31 AM By: Worthy Keeler PA-C Entered By: Worthy Keeler on 07/30/2022 10:55:31 [name] ([id number].pdf Page 3 of 9 -------------------------------------------------------------------------------- Physical Exam Details Patient Name: Date of Service: [name] 07/30/2022 7:45 A M Medical Record Number: [id number] Patient Account Number: [id number] Date of Birth/Sex: Treating RN: 11/17/1955 (67 y.o. [name] Primary Care Provider: Seward [name] Other Clinician: Referring Provider: Treating Provider/Extender: [name] in Treatment: 2 Constitutional Well-nourished and well-hydrated in no acute distress. Respiratory normal breathing without difficulty. Psychiatric this patient is able to make decisions and demonstrates good insight into disease process. Alert and Oriented x 3. pleasant and  cooperative. Notes Upon evaluation patient appears to be infected I do think this is likely Pseudomonas based on the dressing that was seen we will need to address this today. I discussed that with the patient during the visit at this point. Electronic Signature(s) Signed: 07/30/2022 10:56:06 AM By: Worthy Keeler PA-C Previous Signature: 07/30/2022 10:55:44 AM Version By: Worthy Keeler PA-C Entered By: Worthy Keeler on 07/30/2022 10:56:06 -------------------------------------------------------------------------------- Physician Orders Details Patient Name: Date of Service: [name] [name] 07/30/2022 7:45 A M Medical Record Number: [id number] Patient Account Number: [id number] Date of Birth/Sex: Treating RN: 01-03-1956 (67 y.o. [name] Primary Care Provider: Seward [name] Other Clinician: Referring Provider: Treating Provider/Extender: [name] in Treatment: 2 Verbal / Phone Orders: No Diagnosis Coding ICD-10 Coding Code Description E11.622 Type 2 diabetes mellitus with other skin ulcer I89.0 Lymphedema, not elsewhere classified L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 L97.812 Non-pressure chronic ulcer of other part of right lower leg with fat layer exposed I10 Essential (primary) hypertension I50.42 Chronic combined systolic (congestive) and diastolic (congestive) heart failure [name], [name] ([id number].pdf Page 4 of 9 I49.9 Cardiac arrhythmia, unspecified Follow-up Appointments Return Appointment in 1 week. Bathing/ Shower/ Hygiene May shower with wound dressing protected with water repellent cover or cast protector. Anesthetic (Use 'Patient Medications' Section for Anesthetic Order Entry) Lidocaine applied to wound bed Edema Control - Lymphedema / Segmental Compressive Device / Other Elevate, Exercise Daily and A void Standing for Long Periods of  Time. Elevate legs to the level of the heart and pump ankles as often as possible Elevate leg(s) parallel to the floor when sitting. Compression Pump: Use compression pump on left lower extremity for 60 minutes, twice daily. Compression Pump: Use compression pump on right lower extremity for 60 minutes, twice daily. Wound Treatment Wound #1 - Lower Leg Wound Laterality: Right, Anterior Cleanser: Soap and Water 1 x Per Week/30 Days Discharge Instructions: Gently cleanse wound with antibacterial soap, rinse and pat dry prior to dressing wounds Prim Dressing: AquacelAg Advantage Dressing, 4x5 (in/in) 1 x Per Week/30 Days ary Discharge Instructions: Apply to wound  as directed Secondary Dressing: ABD Pad 5x9 (in/in) 1 x Per Week/30 Days Discharge Instructions: Cover with ABD pad Compression Wrap: Medichoice 4 layer Compression System, 35-40 mmHG 1 x Per Week/30 Days Discharge Instructions: Apply multi-layer wrap as directed. Wound #2 - Lower Leg Wound Laterality: Left, Circumferential Cleanser: Soap and Water 1 x Per Week/30 Days Discharge Instructions: Gently cleanse wound with antibacterial soap, rinse and pat dry prior to dressing wounds Prim Dressing: AquacelAg Advantage Dressing, 4x5 (in/in) 1 x Per Week/30 Days ary Discharge Instructions: Apply to wound as directed Secondary Dressing: ABD Pad 5x9 (in/in) 1 x Per Week/30 Days Discharge Instructions: Cover with ABD pad Compression Wrap: Medichoice 4 layer Compression System, 35-40 mmHG 1 x Per Week/30 Days Discharge Instructions: Apply multi-layer wrap as directed. Laboratory Bacteria identified in Wound by Culture (MICRO) [name] Code: [phone] Convenience Name: Wound culture routine Patient Medications llergies: No Known Allergies A Notifications Medication Indication Start End 07/30/2022 levofloxacin DOSE 1 - oral 500 mg tablet - 1 tablet oral once daily x 14 days Electronic Signature(s) Signed: 07/30/2022 9:55:37 AM By: Worthy Keeler PA-C Entered By: Worthy Keeler on 07/30/2022 09:55:37 [name] ([id number].pdf Page 5 of 9 -------------------------------------------------------------------------------- Problem List Details Patient Name: Date of Service: [name] 07/30/2022 7:45 A M Medical Record Number: [id number] Patient Account Number: [id number] Date of Birth/Sex: Treating RN: 09/19/1955 (67 y.o. [name] Primary Care Provider: Seward [name] Other Clinician: Referring Provider: Treating Provider/Extender: [name] in Treatment: 2 Active Problems ICD-10 Encounter Code Description Active Date MDM Diagnosis E11.622 Type 2 diabetes mellitus with other skin ulcer 07/16/2022 No Yes I89.0 Lymphedema, not elsewhere classified 07/16/2022 No Yes L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2/06/2022 No Yes L97.812 Non-pressure chronic ulcer of other part of right lower leg with fat layer 07/16/2022 No Yes exposed [name] (primary) hypertension 07/16/2022 No Yes I50.42 Chronic combined systolic (congestive) and diastolic (congestive) heart failure 07/16/2022 No Yes I49.9 Cardiac arrhythmia, unspecified 07/16/2022 No Yes Inactive Problems Resolved Problems Electronic Signature(s) Signed: 07/30/2022 8:14:06 AM By: Worthy Keeler PA-C Entered By: Worthy Keeler on 07/30/2022 08:14:06 [name] ([id number] Page 6 of 9 -------------------------------------------------------------------------------- Progress Note Details Patient Name: Date of Service: [name] 07/30/2022 7:45 A M Medical Record Number: [id number] Patient Account Number: [id number] Date of Birth/Sex: Treating RN: April 06, 1956 (67 y.o. [name] Primary Care Provider: Seward [name] Other Clinician: Referring Provider: Treating Provider/Extender: [name] in Treatment: 2 Subjective Chief Complaint Information obtained from Patient Bilateral LE ulcers and lymphedema History of Present Illness (HPI) 07-16-2022 upon evaluation today patient appears to be doing poorly currently in regard to his his legs. The left leg is actually worse than the right. This is a patient that is previously been seen in the [place] office and at that point was doing really quite well with compression wrapping and often alginate are either Hydrofera Blue dressings. Fortunately he has gone since November 2020 until just current with out having any issue or need for wound care services which is great news. Unfortunately he is here today because he is having some issues at this point. Patient does have a history of several medical conditions which include diabetes mellitus type 2, lymphedema, hypertension, congestive heart failure, and cardiac arrhythmia though is not sure that this is actually atrial fibrillation based on what he has been told. He  does have lymphedema pumps but tells me he has not used them since the summer when he had to move to a remodel of his building and subsequently never unpacked them. 07-23-2022 upon evaluation today patient appears to be doing well with regard to his legs. He is going require some sharp debridement today but overall seems to be making some progress. I am pleased in that regard he has much less drainage that he had did last week I think the compression wraps are definitely helping. 07-30-2022 upon evaluation today patient appears to be doing poorly in regard to his legs he actually has blue-green drainage which I think is consistent with Pseudomonas. I believe that we need to address this as soon as possible and I discussed that with the patient today as well. Fortunately I do not see any signs of active infection locally nor systemically at this time which is great news. No fevers, chills, nausea, vomiting, or  diarrhea. Objective Constitutional Well-nourished and well-hydrated in no acute distress. Vitals Time Taken: 7:50 AM, Height: 73 in, Weight: 358 lbs, BMI: 47.2, Temperature: 98.3. F, Pulse: 82 bpm, Respiratory Rate: 16 breaths/min, Blood Pressure: 127/81 mmHg. Respiratory normal breathing without difficulty. Psychiatric this patient is able to make decisions and demonstrates good insight into disease process. Alert and Oriented x 3. pleasant and cooperative. General Notes: Upon evaluation patient appears to be infected I do think this is likely Pseudomonas based on the dressing that was seen we will need to address this today. I discussed that with the patient during the visit at this point. Integumentary (Hair, Skin) Wound #1 status is Open. Original cause of wound was Gradually Appeared. The date acquired was: 06/15/2022. The wound has been in treatment 2 weeks. The wound is located on the Right,Anterior Lower Leg. The wound measures 4cm length x 4cm width x 0.1cm depth; 12.566cm^2 area and 1.257cm^3 volume. There is Fat Layer (Subcutaneous Tissue) exposed. There is a medium amount of serosanguineous drainage noted. There is medium (34-66%) red granulation within the wound bed. There is a medium (34-66%) amount of necrotic tissue within the wound bed. Wound #2 status is Open. Original cause of wound was Gradually Appeared. The date acquired was: 06/15/2022. The wound has been in treatment 2 weeks. The wound is located on the Left,Circumferential Lower Leg. The wound measures 10cm length x 35cm width x 0.1cm depth; 274.889cm^2 area and 27.489cm^3 volume. There is Fat Layer (Subcutaneous Tissue) exposed. There is a medium amount of serosanguineous drainage noted. There is medium (34-66%) red granulation within the wound bed. There is a medium (34-66%) amount of necrotic tissue within the wound bed. TABOR, [name] ([name]) [id number] Page 7 of 9 Assessment Active  Problems ICD-10 Type 2 diabetes mellitus with other skin ulcer Lymphedema, not elsewhere classified Non-pressure chronic ulcer of other part of left lower leg with fat layer exposed Non-pressure chronic ulcer of other part of right lower leg with fat layer exposed Essential (primary) hypertension Chronic combined systolic (congestive) and diastolic (congestive) heart failure Cardiac arrhythmia, unspecified Procedures Wound #2 Pre-procedure diagnosis of Wound #2 is a Diabetic Wound/Ulcer of the Lower Extremity located on the Left,Circumferential Lower Leg .Severity of Tissue Pre Debridement is: Fat layer exposed. There was a Excisional Skin/Subcutaneous Tissue Debridement with a total area of 24 sq cm performed by Matthew Sams., PA-C. With the following instrument(s): Curette to remove Viable and Non-Viable tissue/material. Material removed includes Subcutaneous Tissue, Slough, and Biofilm. 1 specimen was taken by a Swab and sent  to the lab per facility protocol.A Minimum amount of bleeding was controlled with Pressure. The procedure was tolerated well. Post Debridement Measurements: 4cm length x 6cm width x 0.3cm depth; 5.655cm^3 volume. Character of Wound/Ulcer Post Debridement requires further debridement. Severity of Tissue Post Debridement is: Fat layer exposed. Post procedure Diagnosis Wound #2: Same as Pre-Procedure Pre-procedure diagnosis of Wound #2 is a Diabetic Wound/Ulcer of the Lower Extremity located on the Left,Circumferential Lower Leg . There was a Four Layer Compression Therapy Procedure by Rosalio Loud, RN. Post procedure Diagnosis Wound #2: Same as Pre-Procedure Wound #1 Pre-procedure diagnosis of Wound #1 is a Diabetic Wound/Ulcer of the Lower Extremity located on the Right,Anterior Lower Leg . There was a Four Layer Compression Therapy Procedure by Rosalio Loud, RN. Post procedure Diagnosis Wound #1: Same as Pre-Procedure Plan Follow-up Appointments: Return  Appointment in 1 week. Bathing/ Shower/ Hygiene: May shower with wound dressing protected with water repellent cover or cast protector. Anesthetic (Use 'Patient Medications' Section for Anesthetic Order Entry): Lidocaine applied to wound bed Edema Control - Lymphedema / Segmental Compressive Device / Other: Elevate, Exercise Daily and Avoid Standing for Long Periods of Time. Elevate legs to the level of the heart and pump ankles as often as possible Elevate leg(s) parallel to the floor when sitting. Compression Pump: Use compression pump on left lower extremity for 60 minutes, twice daily. Compression Pump: Use compression pump on right lower extremity for 60 minutes, twice daily. Laboratory ordered were: Wound culture routine The following medication(s) was prescribed: levofloxacin oral 500 mg tablet 1 1 tablet oral once daily x 14 days starting 07/30/2022 WOUND #1: - Lower Leg Wound Laterality: Right, Anterior Cleanser: Soap and Water 1 x Per Week/30 Days Discharge Instructions: Gently cleanse wound with antibacterial soap, rinse and pat dry prior to dressing wounds Prim Dressing: AquacelAg Advantage Dressing, 4x5 (in/in) 1 x Per Week/30 Days ary Discharge Instructions: Apply to wound as directed Secondary Dressing: ABD Pad 5x9 (in/in) 1 x Per Week/30 Days Discharge Instructions: Cover with ABD pad Com pression Wrap: Medichoice 4 layer Compression System, 35-40 mmHG 1 x Per Week/30 Days Discharge Instructions: Apply multi-layer wrap as directed. WOUND #2: - Lower Leg Wound Laterality: Left, Circumferential Cleanser: Soap and Water 1 x Per Week/30 Days Discharge Instructions: Gently cleanse wound with antibacterial soap, rinse and pat dry prior to dressing wounds Prim Dressing: AquacelAg Advantage Dressing, 4x5 (in/in) 1 x Per Week/30 Days ary Discharge Instructions: Apply to wound as directed Secondary Dressing: ABD Pad 5x9 (in/in) 1 x Per Week/30 Days Discharge Instructions: Cover  with ABD pad Com pression Wrap: Medichoice 4 layer Compression System, 35-40 mmHG 1 x Per Week/30 Days Discharge Instructions: Apply multi-layer wrap as directed. 1. I would recommend that we go ahead and prescribe Levaquin for the patient I did send this into the pharmacy and that should hopefully help to get this infection under control he is in agreement the plan is to pick that up and start taking it tonight. [name], [name] ([id number]) [id number] Page 8 of 9 2. I am also can recommend the patient should continue to elevate his legs much as possible were also using the pain although we need to change this more frequently I am in agreement for nurse visit on Monday next week. We will see patient back for reevaluation in 1 week here in the clinic. If anything worsens or changes patient will contact our office for additional recommendations. Electronic Signature(s) Signed: 07/30/2022 10:58:37 AM By: Worthy Keeler PA-C  Entered By: Worthy Keeler on 07/30/2022 10:58:37 -------------------------------------------------------------------------------- SuperBill Details Patient Name: Date of Service: [name] 07/30/2022 Medical Record Number: [name] Patient Account Number: [id number] Date of Birth/Sex: Treating RN: [birth year] (67 y.o. [name] Primary Care Provider: Seward [name] Other Clinician: Referring Provider: Treating Provider/Extender: [name] in Treatment: 2 Diagnosis Coding ICD-10 Codes Code Description [phone] Type 2 diabetes mellitus with other skin ulcer I89.0 Lymphedema, not elsewhere classified L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 L97.812 Non-pressure chronic ulcer of other part of right lower leg with fat layer exposed I10 Essential (primary) hypertension I50.42 Chronic combined systolic (congestive) and diastolic (congestive) heart  failure I49.9 Cardiac arrhythmia, unspecified Facility Procedures : CPT4 Code: [id number] Description: [place] - DEB SUBQ TISSUE 20 SQ CM/< ICD-10 Diagnosis Description L97.822 Non-pressure chronic ulcer of other part of left lower leg with fat layer expose Modifier: d Quantity: 1 : CPT4 Code: [id number] Description: [id number] - DEB SUBQ TISS EA ADDL 20CM ICD-10 Diagnosis Description L97.822 Non-pressure chronic ulcer of other part of left lower leg with fat layer expose Modifier: d Quantity: 1 Physician Procedures : CPT4 Code Description Modifier [id number] - WC PHYS LEVEL 4 - EST PT 25 ICD-10 Diagnosis Description E11.622 Type 2 diabetes mellitus with other skin ulcer I89.0 Lymphedema, not elsewhere classified L97.822 Non-pressure chronic ulcer of other part  of left lower leg with fat layer exposed L97.812 Non-pressure chronic ulcer of other part of right lower leg with fat layer exposed Quantity: 1 : [id number] - WC PHYS SUBQ TISS 20 SQ CM ICD-10 Diagnosis Description L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 Quantity: 1 : [id number] - WC PHYS SUBQ TISS EA ADDL 20 CM TYSHAN, ZINGSHEIM ([id number] P ICD-10 Diagnosis Description L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 Quantity: 1 age 32 of 54 Electronic Signature(s) Signed: 07/30/2022 11:07:51 AM By: Worthy Keeler PA-C Entered By: Worthy Keeler on 07/30/2022 11:07:50

## 2022-08-01 NOTE — Progress Notes (Signed)
Matthew, [name] ([id number]) [id number] Page 1 of 11 Visit Report for 07/30/2022 Arrival Information Details Patient Name: Date of Service: [name] 07/30/2022 7:45 A M Medical Record Number: [id number] Patient Account Number: [id number] Date of Birth/Sex: Treating RN: 03/03/1956 (67 y.o. Seward Meth Primary Care Zenora Karpel: [name] Other Clinician: Referring Keta Van[place]: Treating Loyola Santino/Extender: [name] in Treatment: 2 Visit Information History Since Last Visit Added or deleted any medications: No Patient Arrived: [name] Any new allergies or adverse reactions: No Arrival Time: 07:50 Had a fall or experienced change in No Accompanied By: self activities of daily living that may affect Transfer Assistance: None risk of falls: Patient Identification Verified: Yes Hospitalized since last visit: No Secondary Verification Process Completed: Yes Has Dressing in Place as Prescribed: Yes Patient Requires Transmission-Based Precautions: No Has Compression in Place as Prescribed: Yes Patient Has Alerts: No Pain Present Now: No Electronic Signature(s) Signed: 07/31/2022 1:32:16 PM By: Rosalio Loud MSN RN CNS WTA Entered By: Rosalio Loud on 07/30/2022 07:50:55 -------------------------------------------------------------------------------- Clinic Level of Care Assessment Details Patient Name: Date of Service: [name] 07/30/2022 7:45 A M Medical Record Number: [id number] Patient Account Number: [id number] Date of Birth/Sex: Treating RN: 09-05-1955 (67 y.o. Seward Meth Primary Care Lorence Nagengast: [name] Other Clinician: Referring Lillybeth Tal: Treating Franshesca Chipman/Extender: [name] in Treatment: 2 Clinic Level of Care Assessment Items TOOL 1 Quantity Score []$  - 0 Use when EandM and Procedure is performed on INITIAL visit ASSESSMENTS - Nursing Assessment / Reassessment []$  -  0 General Physical Exam (combine w/ comprehensive assessment (listed just below) when performed on new pt. evals) []$  - 0 Comprehensive Assessment (HX, ROS, Risk Assessments, Wounds Hx, etc.) ASSESSMENTS - Wound and Skin Assessment / Reassessment []$  - 0 Dermatologic / Skin Assessment (not related to wound area) JAQUORI, HUNSINGER ([id number]) [id number] Page 2 of 11 ASSESSMENTS - Ostomy and/or Continence Assessment and Care []$  - 0 Incontinence Assessment and Management []$  - 0 Ostomy Care Assessment and Management (repouching, etc.) PROCESS - Coordination of Care []$  - 0 Simple Patient / Family Education for ongoing care []$  - 0 Complex (extensive) Patient / Family Education for ongoing care []$  - 0 Staff obtains Programmer, systems, Records, T Results / Process Orders est []$  - 0 Staff telephones HHA, Nursing Homes / Clarify orders / etc []$  - 0 Routine Transfer to another Facility (non-emergent condition) []$  - 0 Routine Hospital Admission (non-emergent condition) []$  - 0 New Admissions / Biomedical engineer / Ordering NPWT Apligraf, etc. , []$  - 0 Emergency Hospital Admission (emergent condition) PROCESS - Special Needs []$  - 0 Pediatric / Minor Patient Management []$  - 0 Isolation Patient Management []$  - 0 Hearing / Language / Visual special needs []$  - 0 Assessment of Community assistance (transportation, D/C planning, etc.) []$  - 0 Additional assistance / Altered mentation []$  - 0 Support Surface(s) Assessment (bed, cushion, seat, etc.) INTERVENTIONS - Miscellaneous []$  - 0 External ear exam []$  - 0 Patient Transfer (multiple staff / Civil Service fast streamer / Similar devices) []$  - 0 Simple Staple / Suture removal (25 or less) []$  - 0 Complex Staple / Suture removal (26 or more) []$  - 0 Hypo/Hyperglycemic Management (do not check if billed separately) []$  - 0 Ankle / Brachial Index (ABI) - do not check if billed separately Has the patient been seen at the hospital  within the last three years: Yes Total Score: 0 Level Of Care: ____ Electronic Signature(s) Signed: 07/31/2022 1:32:16  PM By: Rosalio Loud MSN RN CNS WTA Entered By: Rosalio Loud on 07/30/2022 08:30:32 -------------------------------------------------------------------------------- Compression Therapy Details Patient Name: Date of Service: Jones Broom RLES [name]. 07/30/2022 7:45 A M Medical Record Number: [id number] Patient Account Number: [id number] Date of Birth/Sex: Treating RN: 07-14-1955 (67 y.o. Seward Meth Primary Care Murle Hellstrom: [name] Other Clinician: Referring Daleyssa Loiselle: Treating Willye Javier/Extender: [name] in Treatment: 2 Compression Therapy Performed for Wound Assessment: Wound #2 Left,Circumferential Lower Leg Performed By: Clinician Rosalio Loud, RN Compression Type: [address] ARYE, ESPEJO ([id number]) [id number] Page 3 of 11 Post Procedure Diagnosis Same as Pre-procedure Electronic Signature(s) Signed: 07/31/2022 1:32:16 PM By: Rosalio Loud MSN RN CNS WTA Entered By: Rosalio Loud on 07/30/2022 08:22:50 -------------------------------------------------------------------------------- Compression Therapy Details Patient Name: Date of Service: Jones Broom RLES [name]. 07/30/2022 7:45 A M Medical Record Number: [id number] Patient Account Number: [id number] Date of Birth/Sex: Treating RN: [birth year] (67 y.o. Seward Meth Primary Care Jahmel Flannagan: [name] Other Clinician: Referring Devron Cohick: Treating Marica Trentham/Extender: [name] in Treatment: 2 Compression Therapy Performed for Wound Assessment: Wound #1 Right,Anterior Lower Leg Performed By: Clinician Rosalio Loud, RN Compression Type: Four Layer Post Procedure Diagnosis Same as Pre-procedure Electronic Signature(s) Signed: 07/31/2022 1:32:16 PM By: Rosalio Loud MSN RN CNS WTA Entered By: Rosalio Loud on 07/30/2022  08:29:16 -------------------------------------------------------------------------------- Encounter Discharge Information Details Patient Name: Date of Service: Jones Broom RLES [name]. 07/30/2022 7:45 A M Medical Record Number: [id number] Patient Account Number: [id number] Date of Birth/Sex: Treating RN: Oct 29, 1955 (67 y.o. Seward Meth Primary Care Keeya Dyckman: [name] Other Clinician: Referring [employer] Null: Treating Marion Rosenberry/Extender: [name] in Treatment: 2 Encounter Discharge Information Items Post Procedure Vitals Discharge Condition: Stable Temperature (F): 98.3 Ambulatory Status: Cane Pulse (bpm): 82 Discharge Destination: Home Respiratory Rate (breaths/min): 16 Transportation: Private Auto Blood Pressure (mmHg): 127/81 Accompanied By: self Schedule Follow-up Appointment: Yes Clinical Summary of Care: MARV, BOCKUS ([id number]) [phone].pdf Page 4 of 11 Electronic Signature(s) Signed: 07/31/2022 1:32:16 PM By: Rosalio Loud MSN RN CNS WTA Entered By: Rosalio Loud on 07/30/2022 08:32:06 -------------------------------------------------------------------------------- Lower Extremity Assessment Details Patient Name: Date of Service: Jones Broom RLES [name]. 07/30/2022 7:45 A M Medical Record Number: [id number] Patient Account Number: [id number] Date of Birth/Sex: Treating RN: 1955-10-03 (67 y.o. Seward Meth Primary Care Wynne Jury: [name] Other Clinician: Referring Sten Dematteo: Treating Jerauld Bostwick/Extender: [name] in Treatment: 2 Edema Assessment Assessed: [Left: No] [Right: No] Edema: [Left: Yes] [Right: Yes] Calf Left: Right: Point of Measurement: 35 cm From Medial Instep 50 cm 60 cm Ankle Left: Right: Point of Measurement: 10 cm From Medial Instep 30.2 cm 31.5 cm Vascular Assessment Pulses: Dorsalis Pedis Palpable: [Left:Yes] [Right:Yes] Electronic Signature(s) Signed: 07/31/2022 1:32:16  PM By: Rosalio Loud MSN RN CNS WTA Entered By: Rosalio Loud on 07/30/2022 08:12:30 -------------------------------------------------------------------------------- Multi Wound Chart Details Patient Name: Date of Service: Jones Broom RLES [name]. 07/30/2022 7:45 A M Medical Record Number: [id number] Patient Account Number: [id number] Date of Birth/Sex: Treating RN: [birth year] (67 y.o. Seward Meth Primary Care Ason Heslin: [name] Other Clinician: Referring Logan Baltimore: Treating Mikesha Migliaccio/Extender: [name] in Treatment: 2 BAYLE, YARA ([id number]) [id number] Page 5 of 11 Vital Signs Height(in): 73 Pulse(bpm): 82 Weight(lbs): 358 Blood Pressure(mmHg): 127/81 Body Mass Index(BMI): 47.2 Temperature(F): 98.3. Respiratory Rate(breaths/min): 16 [1:Photos:] [N/A:N/A] Right, Anterior Lower Leg Left, Circumferential Lower Leg N/A Wound Location: Gradually Appeared Gradually Appeared N/A Wounding Event: Diabetic Wound/Ulcer of the Lower  Diabetic Wound/Ulcer of the Lower N/A Primary Etiology: Extremity Extremity Arrhythmia, Congestive Heart Failure, Arrhythmia, Congestive Heart Failure, N/A Comorbid History: Hypertension, Type II Diabetes Hypertension, Type II Diabetes 06/15/2022 06/15/2022 N/A Date Acquired: 2 2 N/A Weeks of Treatment: Open Open N/A Wound Status: No No N/A Wound Recurrence: 4x4x0.1 10x35x0.1 N/A Measurements L x W x D (cm) 12.566 274.889 N/A A (cm) : rea 1.257 27.489 N/A Volume (cm) : 77.50% 54.20% N/A % Reduction in A rea: 77.50% 54.20% N/A % Reduction in Volume: Grade 1 Grade 1 N/A Classification: Medium Medium N/A Exudate A mount: Serosanguineous Serosanguineous N/A Exudate Type: red, brown red, brown N/A Exudate Color: Medium (34-66%) Medium (34-66%) N/A Granulation A mount: Red Red N/A Granulation Quality: Medium (34-66%) Medium (34-66%) N/A Necrotic A mount: Fat Layer (Subcutaneous  Tissue): Yes Fat Layer (Subcutaneous Tissue): Yes N/A Exposed Structures: Fascia: No Fascia: No Tendon: No Tendon: No Muscle: No Muscle: No Joint: No Joint: No Bone: No Bone: No None None N/A Epithelialization: Treatment Notes Electronic Signature(s) Signed: 07/31/2022 1:32:16 PM By: Rosalio Loud MSN RN CNS WTA Entered By: Rosalio Loud on 07/30/2022 08:20:52 -------------------------------------------------------------------------------- Multi-Disciplinary Care Plan Details Patient Name: Date of Service: Jones Broom RLES [name]. 07/30/2022 7:45 A M Medical Record Number: [id number] Patient Account Number: [id number] Date of Birth/Sex: Treating RN: 03/01/1956 (67 y.o. Seward Meth Primary Care Nevelyn Mellott: [name] Other Clinician: Referring Garnetta Fedrick: Treating Kabir Brannock/Extender: [name] in Treatment: 2 KYSIN, CUBITT ([id number]) [id number] Page 6 of 11 Active Inactive Necrotic Tissue Nursing Diagnoses: Knowledge deficit related to management of necrotic/devitalized tissue Goals: Necrotic/devitalized tissue will be minimized in the wound bed Date Initiated: 07/16/2022 Target Resolution Date: 08/14/2022 Goal Status: Active Patient/caregiver will verbalize understanding of reason and process for debridement of necrotic tissue Date Initiated: 07/16/2022 Target Resolution Date: 08/14/2022 Goal Status: Active Interventions: Assess patient pain level pre-, during and post procedure and prior to discharge Provide education on necrotic tissue and debridement process Notes: Wound/Skin Impairment Nursing Diagnoses: Knowledge deficit related to ulceration/compromised skin integrity Goals: Patient/caregiver will verbalize understanding of skin care regimen Date Initiated: 07/16/2022 Target Resolution Date: 08/14/2022 Goal Status: Active Ulcer/skin breakdown will have a volume reduction of 30% by week 4 Date Initiated: 07/16/2022 Target  Resolution Date: 08/14/2022 Goal Status: Active Ulcer/skin breakdown will have a volume reduction of 50% by week 8 Date Initiated: 07/16/2022 Target Resolution Date: 09/14/2022 Goal Status: Active Ulcer/skin breakdown will have a volume reduction of 80% by week 12 Date Initiated: 07/16/2022 Target Resolution Date: 10/14/2022 Goal Status: Active Ulcer/skin breakdown will heal within 14 weeks Date Initiated: 07/16/2022 Target Resolution Date: 11/14/2022 Goal Status: Active Interventions: Assess patient/caregiver ability to obtain necessary supplies Assess patient/caregiver ability to perform ulcer/skin care regimen upon admission and as needed Assess ulceration(s) every visit Notes: Electronic Signature(s) Signed: 07/31/2022 1:32:16 PM By: Rosalio Loud MSN RN CNS WTA Entered By: Rosalio Loud on 07/30/2022 08:31:04 -------------------------------------------------------------------------------- Pain Assessment Details Patient Name: Date of Service: Jones Broom RLES [name]. 07/30/2022 7:45 A M Medical Record Number: [id number] Patient Account Number: [id number] Date of Birth/Sex: Treating RN: 17-Jun-1955 (67 y.o. Seward Meth Primary Care Dondra Rhett: [name] Other Clinician: DOMENICK, CHATMON ([id number]) [id number] Page 7 of 11 Referring Kimani Bedoya: Treating Assunta Pupo/Extender: [name] in Treatment: 2 Active Problems Location of Pain Severity and Description of Pain Patient Has Paino No Site Locations Pain Management and Medication Current Pain Management: Electronic Signature(s) Signed: 07/31/2022 1:32:16 PM By: Rosalio Loud MSN RN CNS WTA  Entered By: Rosalio Loud on 07/30/2022 07:51:28 -------------------------------------------------------------------------------- Patient/Caregiver Education Details Patient Name: Date of Service: [name] 2/15/2024andnbsp7:45 A M Medical Record Number: [id number] Patient Account Number:  [id number] Date of Birth/Gender: Treating RN: 13-Jul-1955 (67 y.o. Seward Meth Primary Care Physician: [name] Other Clinician: Referring Physician: Treating Physician/Extender: [name] in Treatment: 2 Education Assessment Education Provided To: Patient Education Topics Provided Wound/Skin Impairment: Handouts: Caring for Your Ulcer Methods: Explain/Verbal Responses: State content correctly Electronic Signature(s) Signed: 07/31/2022 1:32:16 PM By: Rosalio Loud MSN RN CNS WTA Entered By: Rosalio Loud on 07/30/2022 08:31:00 [name] ([id number] Page 8 of 11 -------------------------------------------------------------------------------- Wound Assessment Details Patient Name: Date of Service: [name] 07/30/2022 7:45 A M Medical Record Number: [id number] Patient Account Number: [id number] Date of Birth/Sex: Treating RN: 1956/02/25 (67 y.o. Seward Meth Primary Care Delpha Perko: [name] Other Clinician: Referring Laverle Pillard: Treating Susan Bleich/Extender: [name] in Treatment: 2 Wound Status Wound Number: 1 Primary Diabetic Wound/Ulcer of the Lower Extremity Etiology: Wound Location: Right, Anterior Lower Leg Wound Status: Open Wounding Event: Gradually Appeared Comorbid Arrhythmia, Congestive Heart Failure, Hypertension, Type II Date Acquired: 06/15/2022 History: Diabetes Weeks Of Treatment: 2 Clustered Wound: No Photos Wound Measurements Length: (cm) 4 Width: (cm) 4 Depth: (cm) 0.1 Area: (cm) 12.566 Volume: (cm) 1.257 % Reduction in Area: 77.5% % Reduction in Volume: 77.5% Epithelialization: None Wound Description Classification: Grade 1 Exudate Amount: Medium Exudate Type: Serosanguineous Exudate Color: red, brown Foul Odor After Cleansing: No Slough/Fibrino Yes Wound Bed Granulation Amount: Medium (34-66%) Exposed Structure Granulation Quality:  Red Fascia Exposed: No Necrotic Amount: Medium (34-66%) Fat Layer (Subcutaneous Tissue) Exposed: Yes Tendon Exposed: No Muscle Exposed: No Joint Exposed: No Bone Exposed: No Treatment Notes Wound #1 (Lower Leg) Wound Laterality: Right, Anterior Cleanser Soap and Water Discharge Instruction: Gently cleanse wound with antibacterial soap, rinse and pat dry prior to dressing wounds SAMRIDH, NABA ([id number] Page 9 of 11 Peri-Wound Care Topical Primary Dressing AquacelAg Advantage Dressing, 4x5 (in/in) Discharge Instruction: Apply to wound as directed Secondary Dressing ABD Pad 5x9 (in/in) Discharge Instruction: Cover with ABD pad Secured With Compression Wrap Medichoice 4 layer Compression System, 35-40 mmHG Discharge Instruction: Apply multi-layer wrap as directed. Compression Stockings Add-Ons Electronic Signature(s) Signed: 07/31/2022 1:32:16 PM By: Rosalio Loud MSN RN CNS WTA Entered By: Rosalio Loud on 07/30/2022 08:10:43 -------------------------------------------------------------------------------- Wound Assessment Details Patient Name: Date of Service: Jones Broom RLES [name]. 07/30/2022 7:45 A M Medical Record Number: [id number] Patient Account Number: [id number] Date of Birth/Sex: Treating RN: 13-Nov-1955 (67 y.o. Seward Meth Primary Care Matteo Banke: [name] Other Clinician: Referring Kunta Hilleary: Treating Etrulia Zarr/Extender: [name] in Treatment: 2 Wound Status Wound Number: 2 Primary Diabetic Wound/Ulcer of the Lower Extremity Etiology: Wound Location: Left, Circumferential Lower Leg Wound Status: Open Wounding Event: Gradually Appeared Comorbid Arrhythmia, Congestive Heart Failure, Hypertension, Type II Date Acquired: 06/15/2022 History: Diabetes Weeks Of Treatment: 2 Clustered Wound: No Photos Wound Measurements Length: (cm) 10 Width: (cm) 35 Depth: (cm) 0.1 Slater, Keygan [name]  ([id number]) Area: (cm) 274.889 Volume: (cm) 27.489 % Reduction in Area: 54.2% % Reduction in Volume: 54.2% Epithelialization: None [id number] Page 10 of 11 Wound Description Classification: Grade 1 Exudate Amount: Medium Exudate Type: Serosanguineous Exudate Color: red, brown Foul Odor After Cleansing: No Slough/Fibrino Yes Wound Bed Granulation Amount: Medium (34-66%) Exposed Structure Granulation Quality: Red Fascia Exposed: No Necrotic Amount: Medium (34-66%) Fat Layer (Subcutaneous Tissue)  Exposed: Yes Tendon Exposed: No Muscle Exposed: No Joint Exposed: No Bone Exposed: No Treatment Notes Wound #2 (Lower Leg) Wound Laterality: Left, Circumferential Cleanser Soap and Water Discharge Instruction: Gently cleanse wound with antibacterial soap, rinse and pat dry prior to dressing wounds Peri-Wound Care Topical Primary Dressing AquacelAg Advantage Dressing, 4x5 (in/in) Discharge Instruction: Apply to wound as directed Secondary Dressing ABD Pad 5x9 (in/in) Discharge Instruction: Cover with ABD pad Secured With Compression Wrap Medichoice 4 layer Compression System, 35-40 mmHG Discharge Instruction: Apply multi-layer wrap as directed. Compression Stockings Add-Ons Electronic Signature(s) Signed: 07/31/2022 1:32:16 PM By: Rosalio Loud MSN RN CNS WTA Entered By: Rosalio Loud on 07/30/2022 08:09:59 -------------------------------------------------------------------------------- Vitals Details Patient Name: Date of Service: [name], CHA RLES [name]. 07/30/2022 7:45 A M Medical Record Number: [id number] Patient Account Number: [id number] Date of Birth/Sex: Treating RN: Jul 26, 1955 (67 y.o. Seward Meth Primary Care Debi Cousin: [name] Other Clinician: Referring Gizzelle Lacomb: Treating Kasi Lasky/Extender: [name] in Treatment: 2 Vital Signs ROCHE, HARTEL ([id number]) [id number] Page 11 of  11 Time Taken: 07:50 Temperature (F): 98.3. Height (in): 73 Pulse (bpm): 82 Weight (lbs): 358 Respiratory Rate (breaths/min): 16 Body Mass Index (BMI): 47.2 Blood Pressure (mmHg): 127/81 Reference Range: 80 - 120 mg / dl Electronic Signature(s) Signed: 07/31/2022 1:32:16 PM By: Rosalio Loud MSN RN CNS WTA Entered By: Rosalio Loud on 07/30/2022 07:51:21

## 2022-08-02 LAB — AEROBIC CULTURE W GRAM STAIN (SUPERFICIAL SPECIMEN): Gram Stain: NONE SEEN

## 2022-08-03 DIAGNOSIS — E11622 Type 2 diabetes mellitus with other skin ulcer: Secondary | ICD-10-CM | POA: Diagnosis not present

## 2022-08-03 NOTE — Progress Notes (Signed)
SLAYDEN, SCHUPP ([id number]) [id number] Page 1 of 2 Visit Report for 08/03/2022 Physician Orders Details Patient Name: Date of Service: [name] 08/03/2022 10:30 A M Medical Record Number: [id number] Patient Account Number: [id number] Date of Birth/Sex: Treating RN: 1956-01-13 (67 y.o. [name] Primary Care Provider: [name] Other Clinician: Referring Provider: Treating Provider/Extender: [name] in Treatment: 2 Verbal / Phone Orders: No Diagnosis Coding Follow-up Appointments Return Appointment in 1 week. Nurse Visit as needed Bathing/ Shower/ Hygiene May shower with wound dressing protected with water repellent cover or cast protector. Anesthetic (Use 'Patient Medications' Section for Anesthetic Order Entry) Lidocaine applied to wound bed Edema Control - Lymphedema / Segmental Compressive Device / Other Elevate, Exercise Daily and A void Standing for Long Periods of Time. Elevate legs to the level of the heart and pump ankles as often as possible Elevate leg(s) parallel to the floor when sitting. Compression Pump: Use compression pump on left lower extremity for 60 minutes, twice daily. Compression Pump: Use compression pump on right lower extremity for 60 minutes, twice daily. DO YOUR BEST to sleep in the bed at night. DO NOT sleep in your recliner. Long hours of sitting in a recliner leads to swelling of the legs and/or potential wounds on your backside. Wound Treatment Wound #1 - Lower Leg Wound Laterality: Right, Anterior Cleanser: Soap and Water 1 x Per Week/30 Days Discharge Instructions: Gently cleanse wound with antibacterial soap, rinse and pat dry prior to dressing wounds Prim Dressing: AquacelAg Advantage Dressing, 4x5 (in/in) 1 x Per Week/30 Days ary Discharge Instructions: Apply to wound as directed Secondary Dressing: ABD Pad 5x9 (in/in) 1 x Per Week/30 Days Discharge Instructions: Cover  with ABD pad Secured With: Tubigrip Size F, 4x10 (in/yd) 1 x Per Week/30 Days Discharge Instructions: Apply Tubigrip F mid-calf to toes Secured With: Tubigrip Size G, 4.5x10 (in/yd) 1 x Per Week/30 Days Discharge Instructions: Apply Tubigrip G 3-finger-widths below knee to base of toes to secure dressing and/or for swelling. Wound #2 - Lower Leg Wound Laterality: Left, Circumferential Cleanser: Soap and Water 1 x Per Week/30 Days Discharge Instructions: Gently cleanse wound with antibacterial soap, rinse and pat dry prior to dressing wounds Prim Dressing: AquacelAg Advantage Dressing, 4x5 (in/in) 1 x Per Week/30 Days ary Discharge Instructions: Apply to wound as directed Secondary Dressing: ABD Pad 5x9 (in/in) 1 x Per Week/30 Days Discharge Instructions: Cover with ABD pad Secured With: Tubigrip Size F, 4x10 (in/yd) 1 x Per Week/30 Days Discharge Instructions: Apply Tubigrip F 3 finger-widths below knee to base of toes to secure dressing and/or for swelling. Compression Wrap: Medichoice 4 layer Compression System, 35-40 mmHG 1 x Per Week/30 Days RICCARDO, HENDREN ([id number]) [id number] Page 2 of 2 Discharge Instructions: Apply multi-layer wrap as directed. Electronic Signature(s) Signed: 08/03/2022 1:35:06 PM By: [name], [name], RN, [name], [name], [name] Signed: 08/03/2022 1:48:29 PM By: Worthy Keeler PA-C Entered By: [name] [name], RN, [name], [name] on 08/03/2022 10:57:51 -------------------------------------------------------------------------------- [place] Details Patient Name: Date of Service: Matthew Broom [name] 08/03/2022 Medical Record Number: [id number] Patient Account Number: [id number] Date of Birth/Sex: Treating RN: December 26, 1955 (67 y.o. 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

## 2022-08-03 NOTE — Progress Notes (Signed)
TIN, STANKUS ([id number]) [id number] Page 1 of 6 Visit Report for 08/03/2022 Arrival Information Details Patient Name: Date of Service: [name] 08/03/2022 10:30 A M Medical Record Number: [id number] Patient Account Number: [id number] Date of Birth/Sex: Treating RN: 04-Apr-1956 (67 y.o. [name] Primary Care [name]: [name] Other Clinician: Referring [name]: Treating [name]/Extender: [name] in Treatment: 2 Visit Information History Since Last Visit Has Dressing in Place as Prescribed: No Patient Arrived: Ambulatory Pain Present Now: No Arrival Time: 10:54 Accompanied By: self Transfer Assistance: None Patient Identification Verified: Yes Secondary Verification Process Completed: Yes Patient Requires Transmission-Based Precautions: No Patient Has Alerts: No Electronic Signature(s) Signed: 08/03/2022 1:35:06 PM By: [name], [name], RN, [name], [name], [name] Entered By: [name], [name], RN, [name], [name] on 08/03/2022 10:55:39 -------------------------------------------------------------------------------- Clinic Level of Care Assessment Details Patient Name: Date of Service: [name], Matthew [name] 08/03/2022 10:30 A M Medical Record Number: [id number] Patient Account Number: [id number] Date of Birth/Sex: Treating RN: 1955/12/10 (67 y.o. [name] Primary Care [name]: [name] Other Clinician: Referring [name]: Treating [name]/Extender: [name] in Treatment: 2 Clinic Level of Care Assessment Items TOOL 4 Quantity Score []$  - 0 Use when only an EandM is performed on FOLLOW-UP visit ASSESSMENTS - Nursing Assessment / Reassessment X- 1 10 Reassessment of Co-morbidities (includes updates in patient status) X- 1 5 Reassessment of Adherence to Treatment Plan ASSESSMENTS - Wound and Skin A ssessment / Reassessment []$  - 0 Simple Wound Assessment / Reassessment - one wound X- 2  5 Complex Wound Assessment / Reassessment - multiple wounds [name], [name] ([id number]) [id number] Page 2 of 6 []$  - 0 Dermatologic / Skin Assessment (not related to wound area) ASSESSMENTS - Focused Assessment []$  - 0 Circumferential Edema Measurements - multi extremities []$  - 0 Nutritional Assessment / Counseling / Intervention []$  - 0 Lower Extremity Assessment (monofilament, tuning fork, pulses) []$  - 0 Peripheral Arterial Disease Assessment (using hand held doppler) ASSESSMENTS - Ostomy and/or Continence Assessment and Care []$  - 0 Incontinence Assessment and Management []$  - 0 Ostomy Care Assessment and Management (repouching, etc.) PROCESS - Coordination of Care X - Simple Patient / Family Education for ongoing care 1 15 []$  - 0 Complex (extensive) Patient / Family Education for ongoing care []$  - 0 Staff obtains Programmer, systems, Records, T Results / Process Orders est []$  - 0 Staff telephones HHA, Nursing Homes / Clarify orders / etc []$  - 0 Routine Transfer to another Facility (non-emergent condition) []$  - 0 Routine Hospital Admission (non-emergent condition) []$  - 0 New Admissions / Biomedical engineer / Ordering NPWT Apligraf, etc. , []$  - 0 Emergency Hospital Admission (emergent condition) X- 1 10 Simple Discharge Coordination []$  - 0 Complex (extensive) Discharge Coordination PROCESS - Special Needs []$  - 0 Pediatric / Minor Patient Management []$  - 0 Isolation Patient Management []$  - 0 Hearing / Language / Visual special needs []$  - 0 Assessment of Community assistance (transportation, D/C planning, etc.) []$  - 0 Additional assistance / Altered mentation []$  - 0 Support Surface(s) Assessment (bed, cushion, seat, etc.) INTERVENTIONS - Wound Cleansing / Measurement []$  - 0 Simple Wound Cleansing - one wound X- 2 5 Complex Wound Cleansing - multiple wounds []$  - 0 Wound Imaging (photographs - any number of wounds) []$  - 0 Wound Tracing  (instead of photographs) []$  - 0 Simple Wound Measurement - one wound X- 2 5 Complex Wound Measurement - multiple wounds INTERVENTIONS - Wound Dressings []$  -  0 Small Wound Dressing one or multiple wounds []$  - 0 Medium Wound Dressing one or multiple wounds X- 2 20 Large Wound Dressing one or multiple wounds []$  - 0 Application of Medications - topical []$  - 0 Application of Medications - injection INTERVENTIONS - Miscellaneous []$  - 0 External ear exam []$  - 0 Specimen Collection (cultures, biopsies, blood, body fluids, etc.) []$  - 0 Specimen(s) / Culture(s) sent or taken to Lab for analysis [name], [name] ([id number]) [id number] Page 3 of 6 []$  - 0 Patient Transfer (multiple staff / Civil Service fast streamer / Similar devices) []$  - 0 Simple Staple / Suture removal (25 or less) []$  - 0 Complex Staple / Suture removal (26 or more) []$  - 0 Hypo / Hyperglycemic Management (close monitor of Blood Glucose) []$  - 0 Ankle / Brachial Index (ABI) - do not check if billed separately []$  - 0 Vital Signs Has the patient been seen at the hospital within the last three years: Yes Total Score: 110 Level Of Care: New/Established - Level 3 Electronic Signature(s) Signed: 08/03/2022 1:35:06 PM By: [name], [name], RN, [name], [name], [name] Entered By: [name], [name], RN, [name], [name] on 08/03/2022 10:59:26 -------------------------------------------------------------------------------- Encounter Discharge Information Details Patient Name: Date of Service: [name], [place] 08/03/2022 10:30 A M Medical Record Number: [id number] Patient Account Number: [id number] Date of Birth/Sex: Treating RN: 12-Nov-1955 (67 y.o. [name] Primary Care [name]: [name] Other Clinician: Referring [name]: Treating [name]/Extender: [name] in Treatment: 2 Encounter Discharge Information Items Discharge Condition: Stable Ambulatory Status: Cane Discharge Destination:  Home Schedule Follow-up Appointment: Yes Clinical Summary of Care: Electronic Signature(s) Signed: 08/03/2022 1:35:06 PM By: [name], [name], RN, [name], [name], [name] Entered By: [name], [name], RN, [name], [name] on 08/03/2022 10:58:14 -------------------------------------------------------------------------------- Wound Assessment Details Patient Name: Date of Service: [name] 08/03/2022 10:30 A M Medical Record Number: [id number] Patient Account Number: [id number] Date of Birth/Sex: Treating RN: 05-14-1956 (67 y.o. [name] Primary Care Nyelah Emmerich: [name] Other Clinician: Referring Deissy Guilbert: Treating Keysi Oelkers/Extender: [name] in Treatment: 2 LARS, HARTKOPF ([id number]) [id number] Page 4 of 6 Wound Status Wound Number: 1 Primary Diabetic Wound/Ulcer of the Lower Extremity Etiology: Wound Location: Right, Anterior Lower Leg Wound Status: Open Wounding Event: Gradually Appeared Comorbid Arrhythmia, Congestive Heart Failure, Hypertension, Type II Date Acquired: 06/15/2022 History: Diabetes Weeks Of Treatment: 2 Clustered Wound: No Wound Measurements Length: (cm) 4 Width: (cm) 4 Depth: (cm) 0.1 Area: (cm) 12.566 Volume: (cm) 1.257 % Reduction in Area: 77.5% % Reduction in Volume: 77.5% Epithelialization: None Wound Description Classification: Grade 1 Exudate Amount: Medium Exudate Type: Serosanguineous Exudate Color: red, brown Foul Odor After Cleansing: No Slough/Fibrino Yes Wound Bed Granulation Amount: Medium (34-66%) Exposed Structure Granulation Quality: Red Fascia Exposed: No Necrotic Amount: Medium (34-66%) Fat Layer (Subcutaneous Tissue) Exposed: Yes Tendon Exposed: No Muscle Exposed: No Joint Exposed: No Bone Exposed: No Treatment Notes Wound #1 (Lower Leg) Wound Laterality: Right, Anterior Cleanser Soap and Water Discharge Instruction: Gently cleanse wound with antibacterial soap, rinse and pat  dry prior to dressing wounds Peri-Wound Care Topical Primary Dressing AquacelAg Advantage Dressing, 4x5 (in/in) Discharge Instruction: Apply to wound as directed Secondary Dressing ABD Pad 5x9 (in/in) Discharge Instruction: Cover with ABD pad Secured With Tubigrip Size F, 4x10 (in/yd) Discharge Instruction: Apply Tubigrip F mid-calf to toes Tubigrip Size G, 4.5x10 (in/yd) Discharge Instruction: Apply Tubigrip G 3-finger-widths below knee to base of toes to secure dressing and/or for swelling.  Compression Wrap Compression Stockings Add-Ons Electronic Signature(s) Signed: 08/03/2022 1:35:06 PM By: [name], [name], RN, [name], [name], [name] Entered By: [name], [name], RN, [name], [name] on 08/03/2022 10:55:57 [name] ([id number]) [id number] Page 5 of 6 -------------------------------------------------------------------------------- Wound Assessment Details Patient Name: Date of Service: [name] 08/03/2022 10:30 A M Medical Record Number: [id number] Patient Account Number: [id number] Date of Birth/Sex: Treating RN: 06/12/1956 (67 y.o. Isac Sarna, Maudie Mercury Primary Care Westley Blass: [name] Other Clinician: Referring Khara Renaud: Treating Shalik Sanfilippo/Extender: [name] in Treatment: 2 Wound Status Wound Number: 2 Primary Diabetic Wound/Ulcer of the Lower Extremity Etiology: Wound Location: Left, Circumferential Lower Leg Wound Status: Open Wounding Event: Gradually Appeared Comorbid Arrhythmia, Congestive Heart Failure, Hypertension, Type Date Acquired: 06/15/2022 History: II Diabetes Weeks Of Treatment: 2 Clustered Wound: No Wound Measurements Length: (cm) 10 Width: (cm) 35 Depth: (cm) 0.1 Area: (cm) 274.889 Volume: (cm) 27.489 % Reduction in Area: 54.2% % Reduction in Volume: 54.2% Epithelialization: None Wound Description Classification: Grade 1 Exudate Amount: Medium Exudate Type: Serosanguineous Exudate Color: red,  brown Foul Odor After Cleansing: No Slough/Fibrino Yes Wound Bed Granulation Amount: Medium (34-66%) Exposed Structure Granulation Quality: Red Fascia Exposed: No Necrotic Amount: Medium (34-66%) Fat Layer (Subcutaneous Tissue) Exposed: Yes Tendon Exposed: No Muscle Exposed: No Joint Exposed: No Bone Exposed: No Treatment Notes Wound #2 (Lower Leg) Wound Laterality: Left, Circumferential Cleanser Soap and Water Discharge Instruction: Gently cleanse wound with antibacterial soap, rinse and pat dry prior to dressing wounds Peri-Wound Care Topical Primary Dressing AquacelAg Advantage Dressing, 4x5 (in/in) Discharge Instruction: Apply to wound as directed Secondary Dressing ABD Pad 5x9 (in/in) Discharge Instruction: Cover with ABD pad Secured With Tubigrip Size F, 4x10 (in/yd) Discharge Instruction: Apply Tubigrip F 3 finger-widths below knee to base of toes to secure dressing and/or for swelling. LISTER, CAPELLE ([id number]) [id number] Page 6 of 6 Compression Wrap Medichoice 4 layer Compression System, 35-40 mmHG Discharge Instruction: Apply multi-layer wrap as directed. Compression Stockings Environmental education officer) Signed: 08/03/2022 1:35:06 PM By: [name], [name], RN, [name], [name], [name] Entered By: [name], [name], RN, [name], [name] on 08/03/2022 10:56:03

## 2022-08-06 DIAGNOSIS — E11622 Type 2 diabetes mellitus with other skin ulcer: Secondary | ICD-10-CM | POA: Diagnosis not present

## 2022-08-10 NOTE — Progress Notes (Signed)
AUTUMN, HAMPE ([id number]) [id number] Page 1 of 2 Visit Report for 08/06/2022 Physician Orders Details Patient Name: Date of Service: [name] 08/06/2022 12:15 PM Medical Record Number: [id number] Patient Account Number: [id number] Date of Birth/Sex: Treating RN: [birth year] (67 y.o. [name] Primary Care Provider: [name] Other Clinician: Massie Kluver Referring Provider: Treating Provider/Extender: [name] in Treatment: 3 Verbal / Phone Orders: No Diagnosis Coding Follow-up Appointments Return Appointment in 1 week. Nurse Visit as needed Bathing/ Shower/ Hygiene May shower with wound dressing protected with water repellent cover or cast protector. Anesthetic (Use 'Patient Medications' Section for Anesthetic Order Entry) Lidocaine applied to wound bed Edema Control - Lymphedema / Segmental Compressive Device / Other Elevate, Exercise Daily and A void Standing for Long Periods of Time. Elevate legs to the level of the heart and pump ankles as often as possible Elevate leg(s) parallel to the floor when sitting. Compression Pump: Use compression pump on left lower extremity for 60 minutes, twice daily. Compression Pump: Use compression pump on right lower extremity for 60 minutes, twice daily. DO YOUR BEST to sleep in the bed at night. DO NOT sleep in your recliner. Long hours of sitting in a recliner leads to swelling of the legs and/or potential wounds on your backside. Wound Treatment Wound #1 - Lower Leg Wound Laterality: Right, Anterior Cleanser: Soap and Water 1 x Per Week/30 Days Discharge Instructions: Gently cleanse wound with antibacterial soap, rinse and pat dry prior to dressing wounds Prim Dressing: AquacelAg Advantage Dressing, 4x5 (in/in) 1 x Per Week/30 Days ary Discharge Instructions: Apply to wound as directed Secondary Dressing: ABD Pad 5x9 (in/in) 1 x Per Week/30 Days Discharge  Instructions: Cover with ABD pad Secured With: Tubigrip Size G, 4.5x10 (in/yd) 1 x Per Week/30 Days Discharge Instructions: Apply Tubigrip G 3-finger-widths below knee to base of toes to secure dressing and/or for swelling. Wound #2 - Lower Leg Wound Laterality: Left, Circumferential Cleanser: Soap and Water 1 x Per Week/30 Days Discharge Instructions: Gently cleanse wound with antibacterial soap, rinse and pat dry prior to dressing wounds Prim Dressing: AquacelAg Advantage Dressing, 4x5 (in/in) 1 x Per Week/30 Days ary Discharge Instructions: Apply to wound as directed Secondary Dressing: ABD Pad 5x9 (in/in) 1 x Per Week/30 Days Discharge Instructions: Cover with ABD pad Secured With: Tubigrip Size G, 4.5x10 (in/yd) 1 x Per Week/30 Days Discharge Instructions: Apply Tubigrip G 3-finger-widths below knee to base of toes to secure dressing and/or for swelling. Electronic Signature(s) OSIRIS, MAROUN ([id number]) [id number] Page 2 of 2 Signed: 08/07/2022 1:45:46 PM By: Worthy Keeler PA-C Signed: 08/10/2022 9:50:02 AM By: Massie Kluver Entered By: Massie Kluver on 08/06/2022 13:07:38 -------------------------------------------------------------------------------- SuperBill Details Patient Name: Date of Service: [name] 08/06/2022 Medical Record Number: [id number] Patient Account Number: [id number] Date of Birth/Sex: Treating RN: [birth year] (67 y.o. 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

## 2022-08-10 NOTE — Progress Notes (Signed)
[name], [name] ([id number]) [id number] Page 1 of 6 Visit Report for 08/06/2022 Arrival Information Details Patient Name: Date of Service: [name] 08/06/2022 12:15 PM Medical Record Number: [id number] Patient Account Number: [id number] Date of Birth/Sex: Treating RN: August 05, 1955 (67 y.o. Isac Sarna, Maudie Mercury Primary Care Vester Titsworth: [name] Other Clinician: Massie Kluver Referring Loralai Eisman: Treating Camaron Cammack/Extender: [name] in Treatment: 3 Visit Information History Since Last Visit All ordered tests and consults were completed: No Patient Arrived: [name] Added or deleted any medications: No Arrival Time: 12:23 Any new allergies or adverse reactions: No Transfer Assistance: None Had a fall or experienced change in No Patient Requires Transmission-Based Precautions: No activities of daily living that may affect Patient Has Alerts: No risk of falls: Signs or symptoms of abuse/neglect since last visito No Hospitalized since last visit: No Implantable device outside of the clinic excluding No cellular tissue based products placed in the center since last visit: Has Dressing in Place as Prescribed: Yes Has Compression in Place as Prescribed: Yes Pain Present Now: Yes Electronic Signature(s) Signed: 08/10/2022 9:50:02 AM By: Massie Kluver Entered By: Massie Kluver on 08/06/2022 13:06:04 -------------------------------------------------------------------------------- Clinic Level of Care Assessment Details Patient Name: Date of Service: [name] 08/06/2022 12:15 PM Medical Record Number: [id number] Patient Account Number: [id number] Date of Birth/Sex: Treating RN: 1956/06/08 (67 y.o. [name] Primary Care Tauriel Scronce: [name] Other Clinician: Massie Kluver Referring Wright Gravely: Treating Shunte Senseney/Extender: [name] in Treatment: 3 Clinic Level of Care Assessment Items TOOL 4  Quantity Score '[]'$  - 0 Use when only an EandM is performed on FOLLOW-UP visit ASSESSMENTS - Nursing Assessment / Reassessment '[]'$  - 0 Reassessment of Co-morbidities (includes updates in patient status) BUSH, BUEHRING ([id number]) [id number] Page 2 of 6 '[]'$  - 0 Reassessment of Adherence to Treatment Plan ASSESSMENTS - Wound and Skin A ssessment / Reassessment '[]'$  - 0 Simple Wound Assessment / Reassessment - one wound X- 2 5 Complex Wound Assessment / Reassessment - multiple wounds '[]'$  - 0 Dermatologic / Skin Assessment (not related to wound area) ASSESSMENTS - Focused Assessment '[]'$  - 0 Circumferential Edema Measurements - multi extremities '[]'$  - 0 Nutritional Assessment / Counseling / Intervention '[]'$  - 0 Lower Extremity Assessment (monofilament, tuning fork, pulses) '[]'$  - 0 Peripheral Arterial Disease Assessment (using hand held doppler) ASSESSMENTS - Ostomy and/or Continence Assessment and Care '[]'$  - 0 Incontinence Assessment and Management '[]'$  - 0 Ostomy Care Assessment and Management (repouching, etc.) PROCESS - Coordination of Care X - Simple Patient / Family Education for ongoing care 1 15 '[]'$  - 0 Complex (extensive) Patient / Family Education for ongoing care '[]'$  - 0 Staff obtains Programmer, systems, Records, T Results / Process Orders est '[]'$  - 0 Staff telephones HHA, Nursing Homes / Clarify orders / etc '[]'$  - 0 Routine Transfer to another Facility (non-emergent condition) '[]'$  - 0 Routine Hospital Admission (non-emergent condition) '[]'$  - 0 New Admissions / Biomedical engineer / Ordering NPWT Apligraf, etc. , '[]'$  - 0 Emergency Hospital Admission (emergent condition) '[]'$  - 0 Simple Discharge Coordination '[]'$  - 0 Complex (extensive) Discharge Coordination PROCESS - Special Needs '[]'$  - 0 Pediatric / Minor Patient Management '[]'$  - 0 Isolation Patient Management '[]'$  - 0 Hearing / Language / Visual special needs '[]'$  - 0 Assessment of Community assistance  (transportation, D/C planning, etc.) '[]'$  - 0 Additional assistance / Altered mentation '[]'$  - 0 Support Surface(s) Assessment (bed, cushion, seat, etc.) INTERVENTIONS - Wound Cleansing /  Measurement '[]'$  - 0 Simple Wound Cleansing - one wound X- 2 5 Complex Wound Cleansing - multiple wounds '[]'$  - 0 Wound Imaging (photographs - any number of wounds) '[]'$  - 0 Wound Tracing (instead of photographs) '[]'$  - 0 Simple Wound Measurement - one wound '[]'$  - 0 Complex Wound Measurement - multiple wounds INTERVENTIONS - Wound Dressings '[]'$  - 0 Small Wound Dressing one or multiple wounds X- 2 15 Medium Wound Dressing one or multiple wounds '[]'$  - 0 Large Wound Dressing one or multiple wounds '[]'$  - 0 Application of Medications - topical '[]'$  - 0 Application of Medications - injection INTERVENTIONS - Miscellaneous HELEN, BUWALDA ([id number] Page 3 of 6 '[]'$  - 0 External ear exam '[]'$  - 0 Specimen Collection (cultures, biopsies, blood, body fluids, etc.) '[]'$  - 0 Specimen(s) / Culture(s) sent or taken to Lab for analysis '[]'$  - 0 Patient Transfer (multiple staff / Harrel Lemon Lift / Similar devices) '[]'$  - 0 Simple Staple / Suture removal (25 or less) '[]'$  - 0 Complex Staple / Suture removal (26 or more) '[]'$  - 0 Hypo / Hyperglycemic Management (close monitor of Blood Glucose) '[]'$  - 0 Ankle / Brachial Index (ABI) - do not check if billed separately '[]'$  - 0 Vital Signs Has the patient been seen at the hospital within the last three years: Yes Total Score: 65 Level Of Care: New/Established - Level 2 Electronic Signature(s) Signed: 08/10/2022 9:50:02 AM By: Massie Kluver Entered By: Massie Kluver on 08/06/2022 13:13:22 -------------------------------------------------------------------------------- Encounter Discharge Information Details Patient Name: Date of Service: [name] 08/06/2022 12:15 PM Medical Record Number: [id number] Patient Account Number:  [id number] Date of Birth/Sex: Treating RN: 1956/03/14 (67 y.o. [name] Primary Care Gladis Soley: [name] Other Clinician: Massie Kluver Referring Alice Burnside: Treating Keeshia Sanderlin/Extender: [name] in Treatment: 3 Encounter Discharge Information Items Discharge Condition: Stable Ambulatory Status: Ambulatory Discharge Destination: Home Transportation: Private Auto Accompanied By: self Schedule Follow-up Appointment: Yes Clinical Summary of Care: Electronic Signature(s) Signed: 08/10/2022 9:50:02 AM By: Massie Kluver Entered By: Massie Kluver on 08/06/2022 13:08:32 [name] ([id number] Page 4 of 6 -------------------------------------------------------------------------------- Wound Assessment Details Patient Name: Date of Service: [name] 08/06/2022 12:15 PM Medical Record Number: [id number] Patient Account Number: [id number] Date of Birth/Sex: Treating RN: [birth year] (67 y.o. Isac Sarna, Maudie Mercury Primary Care Dino Borntreger: [name] Other Clinician: Massie Kluver Referring Ysabelle Goodroe: Treating Camani Sesay/Extender: [name] in Treatment: 3 Wound Status Wound Number: 1 Primary Diabetic Wound/Ulcer of the Lower Extremity Etiology: Wound Location: Right, Anterior Lower Leg Wound Status: Open Wounding Event: Gradually Appeared Comorbid Arrhythmia, Congestive Heart Failure, Hypertension, Type II Date Acquired: 06/15/2022 History: Diabetes Weeks Of Treatment: 3 Clustered Wound: No Wound Measurements Length: (cm) 4 Width: (cm) 4 Depth: (cm) 0.1 Area: (cm) 12.566 Volume: (cm) 1.257 % Reduction in Area: 77.5% % Reduction in Volume: 77.5% Epithelialization: None Wound Description Classification: Grade 1 Exudate Amount: Medium Exudate Type: Serosanguineous Exudate Color: red, brown Foul Odor After Cleansing: No Slough/Fibrino Yes Wound Bed Granulation Amount: Medium  (34-66%) Exposed Structure Granulation Quality: Red Fascia Exposed: No Necrotic Amount: Medium (34-66%) Fat Layer (Subcutaneous Tissue) Exposed: Yes Tendon Exposed: No Muscle Exposed: No Joint Exposed: No Bone Exposed: No Treatment Notes Wound #1 (Lower Leg) Wound Laterality: Right, Anterior Cleanser Soap and Water Discharge Instruction: Gently cleanse wound with antibacterial soap, rinse and pat dry prior to dressing wounds Peri-Wound Care Topical Primary Dressing AquacelAg Advantage Dressing, 4x5 (in/in) Discharge Instruction: Apply  to wound as directed Secondary Dressing ABD Pad 5x9 (in/in) Discharge Instruction: Cover with ABD pad Secured With Tubigrip Size G, 4.5x10 (in/yd) Discharge Instruction: Apply Tubigrip G 3-finger-widths below knee to base of toes to secure dressing and/or for swelling. Compression Wrap Compression Stockings Add-Ons Electronic Signature(s) Signed: 08/07/2022 12:25:37 PM By: [name], [name], RN, [name], [name], [name] Signed: 08/10/2022 9:50:02 AM By: Massie Kluver Entered By: Massie Kluver on 08/06/2022 12:30:13 [name] ([id number] Page 5 of 6 -------------------------------------------------------------------------------- Wound Assessment Details Patient Name: Date of Service: [name] 08/06/2022 12:15 PM Medical Record Number: [id number] Patient Account Number: [id number] Date of Birth/Sex: Treating RN: 06-Jul-1955 (67 y.o. Isac Sarna, Maudie Mercury Primary Care Sabina Beavers: [name] Other Clinician: Massie Kluver Referring Sandee Bernath: Treating Kaci Dillie/Extender: [name] in Treatment: 3 Wound Status Wound Number: 2 Primary Diabetic Wound/Ulcer of the Lower Extremity Etiology: Wound Location: Left, Circumferential Lower Leg Wound Status: Open Wounding Event: Gradually Appeared Comorbid Arrhythmia, Congestive Heart Failure, Hypertension, Type Date Acquired: 06/15/2022 History:  II Diabetes Weeks Of Treatment: 3 Clustered Wound: No Wound Measurements Length: (cm) 10 Width: (cm) 35 Depth: (cm) 0.1 Area: (cm) 274.889 Volume: (cm) 27.489 % Reduction in Area: 54.2% % Reduction in Volume: 54.2% Epithelialization: None Wound Description Classification: Grade 1 Exudate Amount: Medium Exudate Type: Serosanguineous Exudate Color: red, brown Foul Odor After Cleansing: No Slough/Fibrino Yes Wound Bed Granulation Amount: Medium (34-66%) Exposed Structure Granulation Quality: Red Fascia Exposed: No Necrotic Amount: Medium (34-66%) Fat Layer (Subcutaneous Tissue) Exposed: Yes Tendon Exposed: No Muscle Exposed: No Joint Exposed: No Bone Exposed: No Treatment Notes Wound #2 (Lower Leg) Wound Laterality: Left, Circumferential Cleanser Soap and Water Discharge Instruction: Gently cleanse wound with antibacterial soap, rinse and pat dry prior to dressing wounds Peri-Wound Care Topical Primary Dressing AquacelAg Advantage Dressing, 4x5 (in/in) Discharge Instruction: Apply to wound as directed Secondary Dressing ABD Pad 5x9 (in/in) Discharge Instruction: Cover with ABD pad Secured With Tubigrip Size G, 4.5x10 (in/yd) TRAVAUGHN, FRELICH ([id number] Page 6 of 6 Discharge Instruction: Apply Tubigrip G 3-finger-widths below knee to base of toes to secure dressing and/or for swelling. Compression Wrap Compression Stockings Add-Ons Electronic Signature(s) Signed: 08/07/2022 12:25:37 PM By: [name], [name], RN, [name], [name], [name] Signed: 08/10/2022 9:50:02 AM By: Massie Kluver Entered By: Massie Kluver on 08/06/2022 12:30:17

## 2022-08-13 ENCOUNTER — Encounter: Payer: PPO | Admitting: Physician Assistant

## 2022-08-13 DIAGNOSIS — E11622 Type 2 diabetes mellitus with other skin ulcer: Secondary | ICD-10-CM | POA: Diagnosis not present

## 2022-08-13 DIAGNOSIS — L97822 Non-pressure chronic ulcer of other part of left lower leg with fat layer exposed: Secondary | ICD-10-CM | POA: Diagnosis not present

## 2022-08-13 DIAGNOSIS — L97812 Non-pressure chronic ulcer of other part of right lower leg with fat layer exposed: Secondary | ICD-10-CM | POA: Diagnosis not present

## 2022-08-15 NOTE — Progress Notes (Signed)
COMER, BUYER ([id number]) [id number] Page 1 of 9 Visit Report for 08/13/2022 Chief Complaint Document Details Patient Name: Date of Service: [name] 08/13/2022 2:00 PM Medical Record Number: [id number] Patient Account Number: [id number] Date of Birth/Sex: Treating RN: 09-Jun-1956 (67 y.o. [name] Primary Care Provider: [name] Other Clinician: Massie Kluver Referring Provider: Treating Provider/Extender: [name] in Treatment: 4 Information Obtained from: Patient Chief Complaint Bilateral LE ulcers and lymphedema Electronic Signature(s) Signed: 08/13/2022 2:43:39 PM By: Worthy Keeler PA-C Entered By: Worthy Keeler on 08/13/2022 14:43:38 -------------------------------------------------------------------------------- Debridement Details Patient Name: Date of Service: [name] Matthew J. 08/13/2022 2:00 PM Medical Record Number: [id number] Patient Account Number: [id number] Date of Birth/Sex: Treating RN: 1956-01-06 (67 y.o. [name] Primary Care Provider: [name] Other Clinician: Massie Kluver Referring Provider: Treating Provider/Extender: [name] in Treatment: 4 Debridement Performed for Assessment: Wound #1 Right,Anterior Lower Leg Performed By: Physician Tommie Sams., PA-C Debridement Type: Debridement Severity of Tissue Pre Debridement: Fat layer exposed Level of Consciousness (Pre-procedure): Awake and Alert Pre-procedure Verification/Time Out Yes - 14:47 Taken: Start Time: 14:47 T Area Debrided (L x W): otal 0.5 (cm) x 0.7 (cm) = 0.35 (cm) Tissue and other material debrided: Viable, Non-Viable, Slough, Subcutaneous, Slough Level: Skin/Subcutaneous Tissue Debridement Description: Excisional Instrument: Curette Bleeding: Minimum Hemostasis Achieved: Pressure Response to Treatment: Procedure was tolerated well Level of Consciousness (Post- Awake  and Alert procedure): [name], [name] ([id number]) [id number] Page 2 of 9 Post Debridement Measurements of Total Wound Length: (cm) 0.5 Width: (cm) 0.7 Depth: (cm) 0.2 Volume: (cm) 0.055 Character of Wound/Ulcer Post Debridement: Stable Severity of Tissue Post Debridement: Fat layer exposed Post Procedure Diagnosis Same as Pre-procedure Electronic Signature(s) Signed: 08/13/2022 5:15:32 PM By: Worthy Keeler PA-C Signed: 08/13/2022 6:12:13 PM By: [name] [name], RN, [name], [name], [name] Signed: 08/14/2022 1:35:31 PM By: Massie Kluver Entered By: Massie Kluver on 08/13/2022 14:48:12 -------------------------------------------------------------------------------- Debridement Details Patient Name: Date of Service: [name] Matthew J. 08/13/2022 2:00 PM Medical Record Number: [id number] Patient Account Number: [id number] Date of Birth/Sex: Treating RN: 01-24-1956 (67 y.o. [name], [name] Primary Care Provider: [name] Other Clinician: Massie Kluver Referring Provider: Treating Provider/Extender: [name] in Treatment: 4 Debridement Performed for Assessment: Wound #2 Left,Circumferential Lower Leg Performed By: Physician Tommie Sams., PA-C Debridement Type: Debridement Severity of Tissue Pre Debridement: Fat layer exposed Level of Consciousness (Pre-procedure): Awake and Alert Pre-procedure Verification/Time Out Yes - 14:19 Taken: Start Time: 14:49 T Area Debrided (L x W): otal 3 (cm) x 5.5 (cm) = 16.5 (cm) Tissue and other material debrided: Viable, Non-Viable, Slough, Subcutaneous, Slough Level: Skin/Subcutaneous Tissue Debridement Description: Excisional Instrument: Curette Bleeding: Minimum Hemostasis Achieved: Pressure Response to Treatment: Procedure was tolerated well Level of Consciousness (Post- Awake and Alert procedure): Post Debridement Measurements of Total Wound Length: (cm) 7.7 Width: (cm) 38 Depth: (cm)  0.3 Volume: (cm) 68.942 Character of Wound/Ulcer Post Debridement: Stable Severity of Tissue Post Debridement: Fat layer exposed Post Procedure Diagnosis Same as Pre-procedure Electronic Signature(s) Signed: 08/13/2022 5:15:32 PM By: Worthy Keeler PA-C Signed: 08/13/2022 6:12:13 PM By: [name], [name], RN, [name], [name], [name] Signed: 08/14/2022 1:35:31 PM By: [name] ([id number]) [id number] Page 3 of 9 Entered By: Massie Kluver on 08/13/2022 14:54:33 -------------------------------------------------------------------------------- HPI Details Patient Name: Date of Service: [name] 08/13/2022 2:00 PM Medical Record Number: [id number] Patient Account Number: [id number]  Date of Birth/Sex: Treating RN: August 19, 1955 (67 y.o. [name] Primary Care Provider: [name] Other Clinician: Massie Kluver Referring Provider: Treating Provider/Extender: [name] in Treatment: 4 History of Present Illness HPI Description: 07-16-2022 upon evaluation today patient appears to be doing poorly currently in regard to his his legs. The left leg is actually worse than the right. This is a patient that is previously been seen in the [place] office and at that point was doing really quite well with compression wrapping and often alginate are either Hydrofera Blue dressings. Fortunately he has gone since November 2020 until just current with out having any issue or need for wound care services which is great news. Unfortunately he is here today because he is having some issues at this point. Patient does have a history of several medical conditions which include diabetes mellitus type 2, lymphedema, hypertension, congestive heart failure, and cardiac arrhythmia though is not sure that this is actually atrial fibrillation based on what he has been told. He does have lymphedema pumps but tells me he has not used them since the  summer when he had to move to a remodel of his building and subsequently never unpacked them. 07-23-2022 upon evaluation today patient appears to be doing well with regard to his legs. He is going require some sharp debridement today but overall seems to be making some progress. I am pleased in that regard he has much less drainage that he had did last week I think the compression wraps are definitely helping. 07-30-2022 upon evaluation today patient appears to be doing poorly in regard to his legs he actually has blue-green drainage which I think is consistent with Pseudomonas. I believe that we need to address this as soon as possible and I discussed that with the patient today as well. Fortunately I do not see any signs of active infection locally nor systemically at this time which is great news. No fevers, chills, nausea, vomiting, or diarrhea. 08-13-2022 upon evaluation today patient appears to be doing well currently in regard to his leg ulcers which are actually looking much better. Fortunately there does not appear to be any signs of active infection at this time which is great news. No fevers, chills, nausea, vomiting, or diarrhea. Electronic Signature(s) Signed: 08/13/2022 3:11:24 PM By: Worthy Keeler PA-C Entered By: Worthy Keeler on 08/13/2022 15:11:24 -------------------------------------------------------------------------------- Physical Exam Details Patient Name: Date of Service: [name] Matthew J. 08/13/2022 2:00 PM Medical Record Number: [id number] Patient Account Number: [id number] Date of Birth/Sex: Treating RN: Oct 16, 1955 (67 y.o. [name] Primary Care Provider: [name] Other Clinician: Massie Kluver Referring Provider: Treating Provider/Extender: [name] in Treatment: 4 Constitutional Obese and well-hydrated in no acute distress. [name], [name] ([id number]) [id number] Page 4 of 9 Respiratory normal  breathing without difficulty. Psychiatric this patient is able to make decisions and demonstrates good insight into disease process. Alert and Oriented x 3. pleasant and cooperative. Notes Patient is still taking Levaquin and seems to be doing quite well. With that being said I think he is looking much better as far as the overall appearance of his wounds are concerned. Again we tried multiple items of compression were unsuccessful pretty much across the board. He is doing well with the Tubigrip size G. Electronic Signature(s) Signed: 08/13/2022 3:11:43 PM By: Worthy Keeler PA-C Entered By: Worthy Keeler on 08/13/2022 15:11:43 -------------------------------------------------------------------------------- Physician Orders Details Patient Name: Date of Service: [name],  Matthew Matthew J. 08/13/2022 2:00 PM Medical Record Number: [id number] Patient Account Number: [id number] Date of Birth/Sex: Treating RN: 11/15/1955 (67 y.o. [name], [name] Primary Care Provider: [name] Other Clinician: Massie Kluver Referring Provider: Treating Provider/Extender: [name] in Treatment: 4 Verbal / Phone Orders: No Diagnosis Coding ICD-10 Coding Code Description E11.622 Type 2 diabetes mellitus with other skin ulcer I89.0 Lymphedema, not elsewhere classified L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 L97.812 Non-pressure chronic ulcer of other part of right lower leg with fat layer exposed I10 Essential (primary) hypertension I50.42 Chronic combined systolic (congestive) and diastolic (congestive) heart failure I49.9 Cardiac arrhythmia, unspecified Follow-up Appointments Return Appointment in 1 week. Nurse Visit as needed Bathing/ Shower/ Hygiene May shower with wound dressing protected with water repellent cover or cast protector. Anesthetic (Use 'Patient Medications' Section for Anesthetic Order Entry) Lidocaine applied to  wound bed Edema Control - Lymphedema / Segmental Compressive Device / Other Elevate, Exercise Daily and A void Standing for Long Periods of Time. Elevate legs to the level of the heart and pump ankles as often as possible Elevate leg(s) parallel to the floor when sitting. Compression Pump: Use compression pump on left lower extremity for 60 minutes, twice daily. Compression Pump: Use compression pump on right lower extremity for 60 minutes, twice daily. DO YOUR BEST to sleep in the bed at night. DO NOT sleep in your recliner. Long hours of sitting in a recliner leads to swelling of the legs and/or potential wounds on your backside. Wound Treatment Wound #1 - Lower Leg Wound Laterality: Right, Anterior Cleanser: Soap and Water 1 x Per Week/30 Days Discharge Instructions: Gently cleanse wound with antibacterial soap, rinse and pat dry prior to dressing wounds Prim Dressing: AquacelAg Advantage Dressing, 4x5 (in/in) ary 1 x Per Week/30 Days YOSHIYUKI, LOMANTO ([id number]) [id number] Page 5 of 9 Discharge Instructions: Apply to wound as directed Secondary Dressing: ABD Pad 5x9 (in/in) 1 x Per Week/30 Days Discharge Instructions: Cover with ABD pad Secured With: Tubigrip Size G, 4.5x10 (in/yd) 1 x Per Week/30 Days Discharge Instructions: Apply Tubigrip G 3-finger-widths below knee to base of toes to secure dressing and/or for swelling. Wound #2 - Lower Leg Wound Laterality: Left, Circumferential Cleanser: Soap and Water 1 x Per Week/30 Days Discharge Instructions: Gently cleanse wound with antibacterial soap, rinse and pat dry prior to dressing wounds Prim Dressing: AquacelAg Advantage Dressing, 4x5 (in/in) 1 x Per Week/30 Days ary Discharge Instructions: Apply to wound as directed Secondary Dressing: ABD Pad 5x9 (in/in) 1 x Per Week/30 Days Discharge Instructions: Cover with ABD pad Secured With: Tubigrip Size G, 4.5x10 (in/yd) 1 x Per Week/30 Days Discharge  Instructions: Apply Tubigrip G 3-finger-widths below knee to base of toes to secure dressing and/or for swelling. Electronic Signature(s) Signed: 08/13/2022 5:15:32 PM By: Worthy Keeler PA-C Signed: 08/14/2022 1:35:31 PM By: Massie Kluver Entered By: Massie Kluver on 08/13/2022 14:55:23 -------------------------------------------------------------------------------- Problem List Details Patient Name: Date of Service: [name], [place]. 08/13/2022 2:00 PM Medical Record Number: [id number] Patient Account Number: [id number] Date of Birth/Sex: Treating RN: May 05, 1956 (67 y.o. [name] Primary Care Provider: [name] Other Clinician: Massie Kluver Referring Provider: Treating Provider/Extender: [name] in Treatment: 4 Active Problems ICD-10 Encounter Code Description Active Date MDM Diagnosis E11.622 Type 2 diabetes mellitus with other skin ulcer 07/16/2022 No Yes I89.0 Lymphedema, not elsewhere classified 07/16/2022 No Yes L97.822 Non-pressure chronic ulcer of  other part of left lower leg with fat layer exposed2/06/2022 No Yes L97.812 Non-pressure chronic ulcer of other part of right lower leg with fat layer 07/16/2022 No Yes exposed I10 Essential (primary) hypertension 07/16/2022 No Yes [name], [name] ([id number]) [phone].pdf Page 6 of 9 I50.42 Chronic combined systolic (congestive) and diastolic (congestive) heart failure 07/16/2022 No Yes I49.9 Cardiac arrhythmia, unspecified 07/16/2022 No Yes Inactive Problems Resolved Problems Electronic Signature(s) Signed: 08/13/2022 2:43:33 PM By: Worthy Keeler PA-C Entered By: Worthy Keeler on 08/13/2022 14:43:33 -------------------------------------------------------------------------------- Progress Note Details Patient Name: Date of Service: [name], Matthew Quarter. 08/13/2022 2:00 PM Medical Record Number: [id number] Patient Account Number: [id number] Date of Birth/Sex: Treating  RN: February 04, 1956 (67 y.o. [name] Primary Care Provider: [name] Other Clinician: Massie Kluver Referring Provider: Treating Provider/Extender: [name] in Treatment: 4 Subjective Chief Complaint Information obtained from Patient Bilateral LE ulcers and lymphedema History of Present Illness (HPI) 07-16-2022 upon evaluation today patient appears to be doing poorly currently in regard to his his legs. The left leg is actually worse than the right. This is a patient that is previously been seen in the [place] office and at that point was doing really quite well with compression wrapping and often alginate are either Hydrofera Blue dressings. Fortunately he has gone since November 2020 until just current with out having any issue or need for wound care services which is great news. Unfortunately he is here today because he is having some issues at this point. Patient does have a history of several medical conditions which include diabetes mellitus type 2, lymphedema, hypertension, congestive heart failure, and cardiac arrhythmia though is not sure that this is actually atrial fibrillation based on what he has been told. He does have lymphedema pumps but tells me he has not used them since the summer when he had to move to a remodel of his building and subsequently never unpacked them. 07-23-2022 upon evaluation today patient appears to be doing well with regard to his legs. He is going require some sharp debridement today but overall seems to be making some progress. I am pleased in that regard he has much less drainage that he had did last week I think the compression wraps are definitely helping. 07-30-2022 upon evaluation today patient appears to be doing poorly in regard to his legs he actually has blue-green drainage which I think is consistent with Pseudomonas. I believe that we need to address this as soon as possible and I discussed that with the patient  today as well. Fortunately I do not see any signs of active infection locally nor systemically at this time which is great news. No fevers, chills, nausea, vomiting, or diarrhea. 08-13-2022 upon evaluation today patient appears to be doing well currently in regard to his leg ulcers which are actually looking much better. Fortunately there does not appear to be any signs of active infection at this time which is great news. No fevers, chills, nausea, vomiting, or diarrhea. [address] [name], DUEL ([id number]) [id number] Page 7 of 9 Constitutional Obese and well-hydrated in no acute distress. Vitals Time Taken: 2:19 PM, Height: 73 in, Weight: 358 lbs, BMI: 47.2, Temperature: 97.8 F, Pulse: 76 bpm, Respiratory Rate: 18 breaths/min, Blood Pressure: 128/74 mmHg. Respiratory normal breathing without difficulty. Psychiatric this patient is able to make decisions and demonstrates good insight into disease process. Alert and Oriented x 3. pleasant and cooperative. General Notes: Patient is still taking Levaquin and seems to be  doing quite well. With that being said I think he is looking much better as far as the overall appearance of his wounds are concerned. Again we tried multiple items of compression were unsuccessful pretty much across the board. He is doing well with the Tubigrip size G. Integumentary (Hair, Skin) Wound #1 status is Open. Original cause of wound was Gradually Appeared. The date acquired was: 06/15/2022. The wound has been in treatment 4 weeks. The wound is located on the Right,Anterior Lower Leg. The wound measures 0.5cm length x 0.7cm width x 0.2cm depth; 0.275cm^2 area and 0.055cm^3 volume. There is Fat Layer (Subcutaneous Tissue) exposed. There is a medium amount of serosanguineous drainage noted. There is medium (34-66%) red granulation within the wound bed. There is a medium (34-66%) amount of necrotic tissue within the wound bed. Wound #2 status  is Open. Original cause of wound was Gradually Appeared. The date acquired was: 06/15/2022. The wound has been in treatment 4 weeks. The wound is located on the Left,Circumferential Lower Leg. The wound measures 7.7cm length x 38cm width x 0.3cm depth; 229.808cm^2 area and 68.942cm^3 volume. There is Fat Layer (Subcutaneous Tissue) exposed. There is a medium amount of serosanguineous drainage noted. There is medium (34-66%) red granulation within the wound bed. There is a medium (34-66%) amount of necrotic tissue within the wound bed. Assessment Active Problems ICD-10 Type 2 diabetes mellitus with other skin ulcer Lymphedema, not elsewhere classified Non-pressure chronic ulcer of other part of left lower leg with fat layer exposed Non-pressure chronic ulcer of other part of right lower leg with fat layer exposed Essential (primary) hypertension Chronic combined systolic (congestive) and diastolic (congestive) heart failure Cardiac arrhythmia, unspecified Procedures Wound #1 Pre-procedure diagnosis of Wound #1 is a Diabetic Wound/Ulcer of the Lower Extremity located on the Right,Anterior Lower Leg .Severity of Tissue Pre Debridement is: Fat layer exposed. There was a Excisional Skin/Subcutaneous Tissue Debridement with a total area of 0.35 sq cm performed by Tommie Sams., PA-C. With the following instrument(s): Curette to remove Viable and Non-Viable tissue/material. Material removed includes Subcutaneous Tissue and Slough and. A time out was conducted at 14:47, prior to the start of the procedure. A Minimum amount of bleeding was controlled with Pressure. The procedure was tolerated well. Post Debridement Measurements: 0.5cm length x 0.7cm width x 0.2cm depth; 0.055cm^3 volume. Character of Wound/Ulcer Post Debridement is stable. Severity of Tissue Post Debridement is: Fat layer exposed. Post procedure Diagnosis Wound #1: Same as Pre-Procedure Wound #2 Pre-procedure diagnosis of Wound #2 is a  Diabetic Wound/Ulcer of the Lower Extremity located on the Left,Circumferential Lower Leg .Severity of Tissue Pre Debridement is: Fat layer exposed. There was a Excisional Skin/Subcutaneous Tissue Debridement with a total area of 16.5 sq cm performed by Tommie Sams., PA-C. With the following instrument(s): Curette to remove Viable and Non-Viable tissue/material. Material removed includes Subcutaneous Tissue and Slough and. A time out was conducted at 14:19, prior to the start of the procedure. A Minimum amount of bleeding was controlled with Pressure. The procedure was tolerated well. Post Debridement Measurements: 7.7cm length x 38cm width x 0.3cm depth; 68.942cm^3 volume. Character of Wound/Ulcer Post Debridement is stable. Severity of Tissue Post Debridement is: Fat layer exposed. Post procedure Diagnosis Wound #2: Same as Pre-Procedure Plan Follow-up Appointments: Return Appointment in 1 week. Nurse Visit as needed Bathing/ Shower/ Hygiene: May shower with wound dressing protected with water repellent cover or cast protector. Anesthetic (Use 'Patient Medications' Section for Anesthetic Order Entry): Lidocaine applied  to wound bed Edema Control - Lymphedema / Segmental Compressive Device / Other: Elevate, Exercise Daily and Avoid Standing for Long Periods of Time. Elevate legs to the level of the heart and pump ankles as often as possible ZIHAO, MCCOLE ([id number]) [id number] Page 8 of 9 Elevate leg(s) parallel to the floor when sitting. Compression Pump: Use compression pump on left lower extremity for 60 minutes, twice daily. Compression Pump: Use compression pump on right lower extremity for 60 minutes, twice daily. DO YOUR BEST to sleep in the bed at night. DO NOT sleep in your recliner. Long hours of sitting in a recliner leads to swelling of the legs and/or potential wounds on your backside. WOUND #1: - Lower Leg Wound Laterality: Right,  Anterior Cleanser: Soap and Water 1 x Per Week/30 Days Discharge Instructions: Gently cleanse wound with antibacterial soap, rinse and pat dry prior to dressing wounds Prim Dressing: AquacelAg Advantage Dressing, 4x5 (in/in) 1 x Per Week/30 Days ary Discharge Instructions: Apply to wound as directed Secondary Dressing: ABD Pad 5x9 (in/in) 1 x Per Week/30 Days Discharge Instructions: Cover with ABD pad Secured With: Tubigrip Size G, 4.5x10 (in/yd) 1 x Per Week/30 Days Discharge Instructions: Apply Tubigrip G 3-finger-widths below knee to base of toes to secure dressing and/or for swelling. WOUND #2: - Lower Leg Wound Laterality: Left, Circumferential Cleanser: Soap and Water 1 x Per Week/30 Days Discharge Instructions: Gently cleanse wound with antibacterial soap, rinse and pat dry prior to dressing wounds Prim Dressing: AquacelAg Advantage Dressing, 4x5 (in/in) 1 x Per Week/30 Days ary Discharge Instructions: Apply to wound as directed Secondary Dressing: ABD Pad 5x9 (in/in) 1 x Per Week/30 Days Discharge Instructions: Cover with ABD pad Secured With: Tubigrip Size G, 4.5x10 (in/yd) 1 x Per Week/30 Days Discharge Instructions: Apply Tubigrip G 3-finger-widths below knee to base of toes to secure dressing and/or for swelling. 1. I would recommend currently that we have the patient continue at this point with the wound care recommendations as before he is continue with the Levaquin always very close to being done with that. Resume also good recommend that he continue with Tubigrip size G. 3. Continue with the Aquacel followed by ABD pads as well. 4. Also can go ahead and order [facility] topical antibiotics for him. I still see evidence of blue-green drainage I think this will help keep things under control and moving in the right direction so we do not end up with another instance of infection/cellulitis that he needed. We will see patient back for reevaluation in 1 week here in the clinic. If  anything worsens or changes patient will contact our office for additional recommendations. Electronic Signature(s) Signed: 08/13/2022 3:12:33 PM By: Worthy Keeler PA-C Entered By: Worthy Keeler on 08/13/2022 15:12:33 -------------------------------------------------------------------------------- SuperBill Details Patient Name: Date of Service: [name] 08/13/2022 Medical Record Number: [id number] Patient Account Number: [id number] Date of Birth/Sex: Treating RN: May 26, 1956 (67 y.o. [name], [name] Primary Care Provider: [name] Other Clinician: Massie Kluver Referring Provider: Treating Provider/Extender: [name] in Treatment: 4 Diagnosis Coding ICD-10 Codes Code Description [phone] Type 2 diabetes mellitus with other skin ulcer I89.0 Lymphedema, not elsewhere classified L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 L97.812 Non-pressure chronic ulcer of other part of right lower leg with fat layer exposed I10 Essential (primary) hypertension I50.42 Chronic combined systolic (congestive) and diastolic (congestive) heart failure I49.9 Cardiac arrhythmia, unspecified Facility Procedures : Hommel,  Antony Madura CodeRosita Fire ([id number]) [id number] IC L Description: [id number] - DEB SUBQ TISSUE 20 SQ CM/< D-10 Diagnosis Description 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 Modifier: [id number] P 1 Quantity: age 36 of 3 Physician Procedures : CPT4 Code Description Modifier [id number] - WC PHYS LEVEL 4 - EST PT 25 ICD-10 Diagnosis Description E11.622 Type 2 diabetes mellitus with other skin ulcer I89.0 Lymphedema, not elsewhere classified L97.822 Non-pressure chronic ulcer of other part  of left lower leg with fat layer exposed L97.812 Non-pressure chronic ulcer of other part of right lower leg with fat layer exposed Quantity: 1 :  [id number] - WC PHYS SUBQ TISS 20 SQ CM ICD-10 Diagnosis Description L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 Quantity: 1 Electronic Signature(s) Signed: 08/13/2022 3:13:17 PM By: Worthy Keeler PA-C Entered By: Worthy Keeler on 08/13/2022 15:13:17

## 2022-08-17 NOTE — Progress Notes (Signed)
ERBY, CANSECO ([id number]) [id number] Page 1 of 10 Visit Report for 08/13/2022 Arrival Information Details Patient Name: Date of Service: [name] 08/13/2022 2:00 PM Medical Record Number: [id number] Patient Account Number: [id number] Date of Birth/Sex: Treating RN: 09-01-1955 (67 y.o. [name] Primary Care Vadim Centola: [name] Other Clinician: Massie Kluver Referring Lulie Hurd: Treating Amaal Dimartino/Extender: [name] in Treatment: 4 Visit Information History Since Last Visit All ordered tests and consults were completed: No Patient Arrived: Ambulatory Added or deleted any medications: No Arrival Time: 14:17 Any new allergies or adverse reactions: No Transfer Assistance: None Had a fall or experienced change in No Patient Identification Verified: Yes activities of daily living that may affect Secondary Verification Process Completed: Yes risk of falls: Patient Requires Transmission-Based Precautions: No Signs or symptoms of abuse/neglect since last visito No Patient Has Alerts: No Hospitalized since last visit: No Implantable device outside of the clinic excluding No cellular tissue based products placed in the center since last visit: Has Dressing in Place as Prescribed: Yes Has Compression in Place as Prescribed: Yes Pain Present Now: Yes Electronic Signature(s) Signed: 08/14/2022 1:35:31 PM By: Massie Kluver Entered By: Massie Kluver on 08/13/2022 14:18:13 -------------------------------------------------------------------------------- Clinic Level of Care Assessment Details Patient Name: Date of Service: [name] 08/13/2022 2:00 PM Medical Record Number: [id number] Patient Account Number: [id number] Date of Birth/Sex: Treating RN: 29-Jul-1955 (67 y.o. [name] Primary Care Milani Lowenstein: [name] Other Clinician: Massie Kluver Referring Tramond Slinker: Treating Alveta Quintela/Extender: [name] in Treatment: 4 Clinic Level of Care Assessment Items TOOL 1 Quantity Score '[]'$  - 0 Use when EandM and Procedure is performed on INITIAL visit ASSESSMENTS - Nursing Assessment / Reassessment '[]'$  - 0 General Physical Exam (combine w/ comprehensive assessment (listed just below) when performed on new pt. [address] LAMOR, BONENBERGER ([id number]) [id number] Page 2 of 10 '[]'$  - 0 Comprehensive Assessment (HX, ROS, Risk Assessments, Wounds Hx, etc.) ASSESSMENTS - Wound and Skin Assessment / Reassessment '[]'$  - 0 Dermatologic / Skin Assessment (not related to wound area) ASSESSMENTS - Ostomy and/or Continence Assessment and Care '[]'$  - 0 Incontinence Assessment and Management '[]'$  - 0 Ostomy Care Assessment and Management (repouching, etc.) PROCESS - Coordination of Care '[]'$  - 0 Simple Patient / Family Education for ongoing care '[]'$  - 0 Complex (extensive) Patient / Family Education for ongoing care '[]'$  - 0 Staff obtains Programmer, systems, Records, T Results / Process Orders est '[]'$  - 0 Staff telephones HHA, Nursing Homes / Clarify orders / etc '[]'$  - 0 Routine Transfer to another Facility (non-emergent condition) '[]'$  - 0 Routine Hospital Admission (non-emergent condition) '[]'$  - 0 New Admissions / Biomedical engineer / Ordering NPWT Apligraf, etc. , '[]'$  - 0 Emergency Hospital Admission (emergent condition) PROCESS - Special Needs '[]'$  - 0 Pediatric / Minor Patient Management '[]'$  - 0 Isolation Patient Management '[]'$  - 0 Hearing / Language / Visual special needs '[]'$  - 0 Assessment of Community assistance (transportation, D/C planning, etc.) '[]'$  - 0 Additional assistance / Altered mentation '[]'$  - 0 Support Surface(s) Assessment (bed, cushion, seat, etc.) INTERVENTIONS - Miscellaneous '[]'$  - 0 External ear exam '[]'$  - 0 Patient Transfer (multiple staff / Civil Service fast streamer / Similar devices) '[]'$  - 0 Simple Staple / Suture removal (25 or less) '[]'$  - 0 Complex  Staple / Suture removal (26 or more) '[]'$  - 0 Hypo/Hyperglycemic Management (do not check if billed separately) '[]'$  - 0 Ankle / Brachial Index (ABI) - do  not check if billed separately Has the patient been seen at the hospital within the last three years: Yes Total Score: 0 Level Of Care: ____ Electronic Signature(s) Signed: 08/14/2022 1:35:31 PM By: Massie Kluver Entered By: Massie Kluver on 08/13/2022 14:55:29 -------------------------------------------------------------------------------- Encounter Discharge Information Details Patient Name: Date of Service: [name], [place]. 08/13/2022 2:00 PM Medical Record Number: [id number] Patient Account Number: [id number] Date of Birth/Sex: Treating RN: [birth year] (67 y.o. [name] Primary Care Zebedee Segundo: [name] Other Clinician: Cecil, Arndt ([id number]) [id number] Page 3 of 10 Referring Lynnell Fiumara: Treating Yeny Schmoll/Extender: [name] in Treatment: 4 Encounter Discharge Information Items Post Procedure Vitals Discharge Condition: Stable Temperature (F): 97.8 Ambulatory Status: Walker Pulse (bpm): 76 Discharge Destination: Home Respiratory Rate (breaths/min): 18 Transportation: Private Auto Blood Pressure (mmHg): 128/74 Accompanied By: wife Schedule Follow-up Appointment: Yes Clinical Summary of Care: Electronic Signature(s) Signed: 08/14/2022 1:35:31 PM By: Massie Kluver Entered By: Massie Kluver on 08/13/2022 17:01:40 -------------------------------------------------------------------------------- Lower Extremity Assessment Details Patient Name: Date of Service: [name] 08/13/2022 2:00 PM Medical Record Number: [id number] Patient Account Number: [id number] Date of Birth/Sex: Treating RN: Jul 13, 1955 (67 y.o. [name] Primary Care Karissa Meenan: [name] Other Clinician: Massie Kluver Referring Sani Madariaga: Treating Rashanna Christiana/Extender:  [name] in Treatment: 4 Edema Assessment Assessed: Shirlyn Goltz: Yes] [Right: Yes] Edema: [Left: Yes] [Right: Yes] Calf Left: Right: Point of Measurement: 35 cm From Medial Instep 60 cm 64.5 cm Ankle Left: Right: Point of Measurement: 10 cm From Medial Instep 32.8 cm 31.5 cm Vascular Assessment Pulses: Dorsalis Pedis Palpable: [Left:Yes] [Right:Yes] Electronic Signature(s) Signed: 08/13/2022 6:12:13 PM By: [name], [name], RN, [name], [name], [name] Signed: 08/14/2022 1:35:31 PM By: Massie Kluver Entered By: Massie Kluver on 08/13/2022 14:41:06 [name] ([id number].pdf Page 4 of 10 -------------------------------------------------------------------------------- Multi Wound Chart Details Patient Name: Date of Service: [name] 08/13/2022 2:00 PM Medical Record Number: [id number] Patient Account Number: [id number] Date of Birth/Sex: Treating RN: 06-25-1955 (67 y.o. Isac Sarna, Maudie Mercury Primary Care Degan Hanser: [name] Other Clinician: Massie Kluver Referring Tyese Finken: Treating Roshunda Keir/Extender: [name] in Treatment: 4 Vital Signs Height(in): 73 Pulse(bpm): 63 Weight(lbs): [id number] Blood Pressure(mmHg): 128/74 Body Mass Index(BMI): 47.2 Temperature(F): 97.8 Respiratory Rate(breaths/min): 18 [1:Photos:] [N/A:N/A] Right, Anterior Lower Leg Left, Circumferential Lower Leg N/A Wound Location: Gradually Appeared Gradually Appeared N/A Wounding Event: Diabetic Wound/Ulcer of the Lower Diabetic Wound/Ulcer of the Lower N/A Primary Etiology: Extremity Extremity Arrhythmia, Congestive Heart Failure, Arrhythmia, Congestive Heart Failure, N/A Comorbid History: Hypertension, Type II Diabetes Hypertension, Type II Diabetes 06/15/2022 06/15/2022 N/A Date Acquired: 4 4 N/A Weeks of Treatment: Open Open N/A Wound Status: No No N/A Wound Recurrence: 0.5x0.7x0.2 7.7x38x0.3 N/A Measurements L x W x D  (cm) 0.275 229.808 N/A A (cm) : rea 0.055 68.942 N/A Volume (cm) : 99.50% 61.80% N/A % Reduction in A rea: 99.00% -14.70% N/A % Reduction in Volume: Grade 1 Grade 1 N/A Classification: Medium Medium N/A Exudate A mount: Serosanguineous Serosanguineous N/A Exudate Type: red, brown red, brown N/A Exudate Color: Medium (34-66%) Medium (34-66%) N/A Granulation A mount: Red Red N/A Granulation Quality: Medium (34-66%) Medium (34-66%) N/A Necrotic A mount: Fat Layer (Subcutaneous Tissue): Yes Fat Layer (Subcutaneous Tissue): Yes N/A Exposed Structures: Fascia: No Fascia: No Tendon: No Tendon: No Muscle: No Muscle: No Joint: No Joint: No Bone: No Bone: No None None N/A Epithelialization: ARYIAN, GIOVINO ([id number]) [id number] Page 5 of 10 Treatment Notes  Electronic Signature(s) Signed: 08/14/2022 1:35:31 PM By: Massie Kluver Entered By: Massie Kluver on 08/13/2022 14:41:11 -------------------------------------------------------------------------------- Multi-Disciplinary Care Plan Details Patient Name: Date of Service: [name], [place]. 08/13/2022 2:00 PM Medical Record Number: [id number] Patient Account Number: [id number] Date of Birth/Sex: Treating RN: April 28, 1956 (67 y.o. Isac Sarna, Maudie Mercury Primary Care Elizebath Wever: [name] Other Clinician: Massie Kluver Referring Kearston Putman: Treating Emmalee Solivan/Extender: [name] in Treatment: 4 Active Inactive Necrotic Tissue Nursing Diagnoses: Knowledge deficit related to management of necrotic/devitalized tissue Goals: Necrotic/devitalized tissue will be minimized in the wound bed Date Initiated: 07/16/2022 Target Resolution Date: 08/14/2022 Goal Status: Active Patient/caregiver will verbalize understanding of reason and process for debridement of necrotic tissue Date Initiated: 07/16/2022 Target Resolution Date: 08/14/2022 Goal Status: Active Interventions: Assess patient  pain level pre-, during and post procedure and prior to discharge Provide education on necrotic tissue and debridement process Notes: Wound/Skin Impairment Nursing Diagnoses: Knowledge deficit related to ulceration/compromised skin integrity Goals: Patient/caregiver will verbalize understanding of skin care regimen Date Initiated: 07/16/2022 Target Resolution Date: 08/14/2022 Goal Status: Active Ulcer/skin breakdown will have a volume reduction of 30% by week 4 Date Initiated: 07/16/2022 Target Resolution Date: 08/14/2022 Goal Status: Active Ulcer/skin breakdown will have a volume reduction of 50% by week 8 Date Initiated: 07/16/2022 Target Resolution Date: 09/14/2022 Goal Status: Active Ulcer/skin breakdown will have a volume reduction of 80% by week 12 Date Initiated: 07/16/2022 Target Resolution Date: 10/14/2022 Goal Status: Active Ulcer/skin breakdown will heal within 14 weeks Date Initiated: 07/16/2022 Target Resolution Date: 11/14/2022 Goal Status: Active Interventions: Assess patient/caregiver ability to obtain necessary supplies JEREMEY, DIGUGLIELMO ([id number]) [phone].pdf Page 6 of 10 Assess patient/caregiver ability to perform ulcer/skin care regimen upon admission and as needed Assess ulceration(s) every visit Notes: Electronic Signature(s) Signed: 08/13/2022 6:12:13 PM By: [name], [name], RN, [name], [name], [name] Signed: 08/14/2022 1:35:31 PM By: Massie Kluver Entered By: Massie Kluver on 08/13/2022 16:59:10 -------------------------------------------------------------------------------- Pain Assessment Details Patient Name: Date of Service: [name] 08/13/2022 2:00 PM Medical Record Number: [id number] Patient Account Number: [id number] Date of Birth/Sex: Treating RN: 14-Jul-1955 (67 y.o. [name] Primary Care Ricki Clack: [name] Other Clinician: Massie Kluver Referring Andrian Urbach: Treating Krislynn Gronau/Extender: [name] in  Treatment: 4 Active Problems Location of Pain Severity and Description of Pain Patient Has Paino Yes Site Locations Pain Location: Pain in Ulcers Duration of the Pain. Constant / Intermittento Constant Rate the pain. Current Pain Level: 3 Character of Pain Describe the Pain: Aching, Dull Pain Management and Medication Current Pain Management: Medication: Yes Cold Application: No Rest: Yes Massage: No Activity: No T.E.N.S.: No Heat Application: No Leg drop or elevation: No Is the Current Pain Management Adequate: Inadequate How does your wound impact your activities of daily livingo Sleep: No Bathing: No Appetite: No Relationship With Others: No Bladder Continence: No Emotions: No Bowel Continence: No Work: No Toileting: No Drive: No Dressing: No HobbiesJAHAIR, ESCANO ([id number]) [id number] Page 7 of 10 Electronic Signature(s) Signed: 08/13/2022 6:12:13 PM By: [name], [name], RN, [name], [name], [name] Signed: 08/14/2022 1:35:31 PM By: Massie Kluver Entered By: Massie Kluver on 08/13/2022 14:37:54 -------------------------------------------------------------------------------- Patient/Caregiver Education Details Patient Name: Date of Service: [name] 2/29/2024andnbsp2:00 PM Medical Record Number: [id number] Patient Account Number: [id number] Date of Birth/Gender: Treating RN: [birth year] (67 y.o. [name] Primary Care Physician: [name] Other Clinician: Massie Kluver Referring Physician: Treating Physician/Extender: [name] in Treatment: 4 Education  Assessment Education Provided To: Patient Education Topics Provided Wound/Skin Impairment: Handouts: Other: continue wound care as directed Methods: Explain/Verbal Responses: State content correctly Electronic Signature(s) Signed: 08/14/2022 1:35:31 PM By: Massie Kluver Entered By: Massie Kluver on 08/13/2022  16:59:37 -------------------------------------------------------------------------------- Wound Assessment Details Patient Name: Date of Service: [name] 08/13/2022 2:00 PM Medical Record Number: [id number] Patient Account Number: [id number] Date of Birth/Sex: Treating RN: 1955-09-24 (67 y.o. [name] Primary Care Zackari Ruane: [name] Other Clinician: Massie Kluver Referring Yaret Hush: Treating Kirstin Kugler/Extender: [name] in Treatment: 4 Wound Status Wound Number: 1 Primary Diabetic Wound/Ulcer of the Lower Extremity Etiology: Wound Location: Right, Anterior Lower Leg Wound Status: Open Wounding Event: Gradually Appeared Comorbid Arrhythmia, Congestive Heart Failure, Hypertension, Type II Date Acquired: 06/15/2022 AETHAN, BARNABA ([id number]) [id number] Page 8 of 10 Date Acquired: 06/15/2022 History: Diabetes Weeks Of Treatment: 4 Clustered Wound: No Photos Wound Measurements Length: (cm) 0.5 Width: (cm) 0.7 Depth: (cm) 0.2 Area: (cm) 0.275 Volume: (cm) 0.055 % Reduction in Area: 99.5% % Reduction in Volume: 99% Epithelialization: None Wound Description Classification: Grade 1 Exudate Amount: Medium Exudate Type: Serosanguineous Exudate Color: red, brown Foul Odor After Cleansing: No Slough/Fibrino Yes Wound Bed Granulation Amount: Medium (34-66%) Exposed Structure Granulation Quality: Red Fascia Exposed: No Necrotic Amount: Medium (34-66%) Fat Layer (Subcutaneous Tissue) Exposed: Yes Tendon Exposed: No Muscle Exposed: No Joint Exposed: No Bone Exposed: No Treatment Notes Wound #1 (Lower Leg) Wound Laterality: Right, Anterior Cleanser Soap and Water Discharge Instruction: Gently cleanse wound with antibacterial soap, rinse and pat dry prior to dressing wounds Peri-Wound Care Topical Primary Dressing AquacelAg Advantage Dressing, 4x5 (in/in) Discharge Instruction: Apply to wound as  directed Secondary Dressing ABD Pad 5x9 (in/in) Discharge Instruction: Cover with ABD pad Secured With Tubigrip Size G, 4.5x10 (in/yd) Discharge Instruction: Apply Tubigrip G 3-finger-widths below knee to base of toes to secure dressing and/or for swelling. Compression Wrap Compression Stockings Add-Ons Electronic Signature(s) Signed: 08/13/2022 6:12:13 PM By: [name], [name], RN, [name], [name], [name] Signed: 08/14/2022 1:35:31 PM By: Massie Kluver Entered By: Massie Kluver on 08/13/2022 14:39:08 [name] ([id number] Page 9 of 10 -------------------------------------------------------------------------------- Wound Assessment Details Patient Name: Date of Service: [name] 08/13/2022 2:00 PM Medical Record Number: [id number] Patient Account Number: [id number] Date of Birth/Sex: Treating RN: Jun 15, 1956 (67 y.o. Isac Sarna, Maudie Mercury Primary Care Selicia Windom: [name] Other Clinician: Massie Kluver Referring Devaun Hernandez: Treating Maziah Keeling/Extender: [name] in Treatment: 4 Wound Status Wound Number: 2 Primary Diabetic Wound/Ulcer of the Lower Extremity Etiology: Wound Location: Left, Circumferential Lower Leg Wound Status: Open Wounding Event: Gradually Appeared Comorbid Arrhythmia, Congestive Heart Failure, Hypertension, Type II Date Acquired: 06/15/2022 History: Diabetes Weeks Of Treatment: 4 Clustered Wound: No Photos Wound Measurements Length: (cm) 7.7 Width: (cm) 38 Depth: (cm) 0.3 Area: (cm) 229.808 Volume: (cm) 68.942 % Reduction in Area: 61.8% % Reduction in Volume: -14.7% Epithelialization: None Wound Description Classification: Grade 1 Exudate Amount: Medium Exudate Type: Serosanguineous Exudate Color: red, brown Foul Odor After Cleansing: No Slough/Fibrino Yes Wound Bed Granulation Amount: Medium (34-66%) Exposed Structure Granulation Quality: Red Fascia Exposed: No Necrotic Amount:  Medium (34-66%) Fat Layer (Subcutaneous Tissue) Exposed: Yes Tendon Exposed: No Muscle Exposed: No Joint Exposed: No Bone Exposed: No Treatment Notes Wound #2 (Lower Leg) Wound Laterality: Left, Circumferential Cleanser Soap and Water Discharge Instruction: Gently cleanse wound with antibacterial soap, rinse and pat dry prior to dressing wounds TIMITHY, DOHN ([id number]) [phone].pdf Page 10 of  10 Peri-Wound Care Topical Primary Dressing AquacelAg Advantage Dressing, 4x5 (in/in) Discharge Instruction: Apply to wound as directed Secondary Dressing ABD Pad 5x9 (in/in) Discharge Instruction: Cover with ABD pad Secured With Tubigrip Size G, 4.5x10 (in/yd) Discharge Instruction: Apply Tubigrip G 3-finger-widths below knee to base of toes to secure dressing and/or for swelling. Compression Wrap Compression Stockings Add-Ons Electronic Signature(s) Signed: 08/13/2022 6:12:13 PM By: [name], [name], RN, [name], [name], [name] Signed: 08/14/2022 1:35:31 PM By: Massie Kluver Entered By: Massie Kluver on 08/13/2022 14:39:58 -------------------------------------------------------------------------------- Vitals Details Patient Name: Date of Service: [name], [place]. 08/13/2022 2:00 PM Medical Record Number: [id number] Patient Account Number: [id number] Date of Birth/Sex: Treating RN: 07-14-1955 (67 y.o. Isac Sarna, Maudie Mercury Primary Care Tabetha Haraway: [name] Other Clinician: Massie Kluver Referring Cortlyn Cannell: Treating Srinidhi Landers/Extender: [name] in Treatment: 4 Vital Signs Time Taken: 14:19 Temperature (F): 97.8 Height (in): 73 Pulse (bpm): 76 Weight (lbs): 358 Respiratory Rate (breaths/min): 18 Body Mass Index (BMI): 47.2 Blood Pressure (mmHg): 128/74 Reference Range: 80 - 120 mg / dl Electronic Signature(s) Signed: 08/14/2022 1:35:31 PM By: Massie Kluver Entered By: Massie Kluver on 08/13/2022 14:37:50

## 2022-08-20 ENCOUNTER — Encounter: Payer: PPO | Attending: Internal Medicine | Admitting: Internal Medicine

## 2022-08-20 DIAGNOSIS — I890 Lymphedema, not elsewhere classified: Secondary | ICD-10-CM | POA: Insufficient documentation

## 2022-08-20 DIAGNOSIS — I4891 Unspecified atrial fibrillation: Secondary | ICD-10-CM | POA: Diagnosis not present

## 2022-08-20 DIAGNOSIS — I5042 Chronic combined systolic (congestive) and diastolic (congestive) heart failure: Secondary | ICD-10-CM | POA: Insufficient documentation

## 2022-08-20 DIAGNOSIS — E11622 Type 2 diabetes mellitus with other skin ulcer: Secondary | ICD-10-CM | POA: Diagnosis not present

## 2022-08-20 DIAGNOSIS — L97822 Non-pressure chronic ulcer of other part of left lower leg with fat layer exposed: Secondary | ICD-10-CM | POA: Insufficient documentation

## 2022-08-20 DIAGNOSIS — L97812 Non-pressure chronic ulcer of other part of right lower leg with fat layer exposed: Secondary | ICD-10-CM | POA: Insufficient documentation

## 2022-08-20 DIAGNOSIS — I872 Venous insufficiency (chronic) (peripheral): Secondary | ICD-10-CM | POA: Diagnosis not present

## 2022-08-20 DIAGNOSIS — I110 Hypertensive heart disease with heart failure: Secondary | ICD-10-CM | POA: Diagnosis not present

## 2022-08-21 DIAGNOSIS — I87311 Chronic venous hypertension (idiopathic) with ulcer of right lower extremity: Secondary | ICD-10-CM | POA: Diagnosis not present

## 2022-08-22 NOTE — Progress Notes (Signed)
[name], [name] ([id number]) [id number] Page 1 of 10 Visit Report for 08/20/2022 Arrival Information Details Patient Name: Date of Service: [name] 08/20/2022 12:45 PM Medical Record Number: [id number] Patient Account Number: [id number] Date of Birth/Sex: Treating RN: [birth year]-[name] (67 y.o. [name] Primary Care [name]: [name] Other Clinician: Massie [name] Referring [name]: Treating [name]/Extender: RO BSO N, [name] [name], [name] in Treatment: 5 Visit Information History Since Last Visit All ordered tests and consults were completed: No Patient Arrived: Ambulatory Added or deleted any medications: No Arrival Time: 12:54 Any new allergies or adverse reactions: No Transfer Assistance: None Had a fall or experienced change in No Patient Identification Verified: Yes activities of daily living that may affect Secondary Verification Process Completed: Yes risk of falls: Patient Requires Transmission-Based Precautions: No Signs or symptoms of abuse/neglect since last visito No Patient Has Alerts: No Hospitalized since last visit: No Implantable device outside of the clinic excluding No cellular tissue based products placed in the center since last visit: Has Dressing in Place as Prescribed: Yes Has Compression in Place as Prescribed: Yes Pain Present Now: Yes Electronic Signature(s) Signed: 08/20/2022 3:52:49 PM By: [name] Entered By: [name] on 03/[name]/2024 12:55:16 -------------------------------------------------------------------------------- Clinic Level of Care Assessment Details Patient Name: Date of Service: [name] 08/20/2022 12:45 PM Medical Record Number: [id number] Patient Account Number: [id number] Date of Birth/Sex: Treating RN: [birth year], [name] (67 y.o. [name] Primary Care Cal Gindlesperger: [name] Other Clinician: Massie [name] Referring Brelee Renk: Treating Rhema Boyett/Extender: RO  BSO N, [name] [name], [name] in Treatment: 5 Clinic Level of Care Assessment Items TOOL 1 Quantity Score '[]'$  - 0 Use when EandM and Procedure is performed on INITIAL visit ASSESSMENTS - Nursing Assessment / Reassessment '[]'$  - 0 General Physical Exam (combine w/ comprehensive assessment (listed just below) when performed on new pt. [address] [name], [name] ([id number]) [id number] Page 2 of 10 '[]'$  - 0 Comprehensive Assessment (HX, ROS, Risk Assessments, Wounds Hx, etc.) ASSESSMENTS - Wound and Skin Assessment / Reassessment '[]'$  - 0 Dermatologic / Skin Assessment (not related to wound area) ASSESSMENTS - Ostomy and/or Continence Assessment and Care '[]'$  - 0 Incontinence Assessment and Management '[]'$  - 0 Ostomy Care Assessment and Management (repouching, etc.) PROCESS - Coordination of Care '[]'$  - 0 Simple Patient / Family Education for ongoing care '[]'$  - 0 Complex (extensive) Patient / Family Education for ongoing care '[]'$  - 0 Staff obtains Programmer, systems, Records, T Results / Process Orders est '[]'$  - 0 Staff telephones HHA, Nursing Homes / Clarify orders / etc '[]'$  - 0 Routine Transfer to another Facility (non-emergent condition) '[]'$  - 0 Routine Hospital Admission (non-emergent condition) '[]'$  - 0 New Admissions / Biomedical engineer / Ordering NPWT Apligraf, etc. , '[]'$  - 0 Emergency Hospital Admission (emergent condition) PROCESS - Special Needs '[]'$  - 0 Pediatric / Minor Patient Management '[]'$  - 0 Isolation Patient Management '[]'$  - 0 Hearing / Language / Visual special needs '[]'$  - 0 Assessment of Community assistance (transportation, D/C planning, etc.) '[]'$  - 0 Additional assistance / Altered mentation '[]'$  - 0 Support Surface(s) Assessment (bed, cushion, seat, etc.) INTERVENTIONS - Miscellaneous '[]'$  - 0 External ear exam '[]'$  - 0 Patient Transfer (multiple staff / Civil Service fast streamer / Similar devices) '[]'$  - 0 Simple Staple / Suture removal (25 or less) '[]'$  -  0 Complex Staple / Suture removal (26 or more) '[]'$  - 0 Hypo/Hyperglycemic Management (do not check if billed separately) '[]'$  -  0 Ankle / Brachial Index (ABI) - do not check if billed separately Has the patient been seen at the hospital within the last three years: Yes Total Score: 0 Level Of Care: ____ Electronic Signature(s) Signed: 08/20/2022 3:52:49 PM By: [name] Entered By: [name] on 03/[name]/2024 13:34:10 -------------------------------------------------------------------------------- Encounter Discharge Information Details Patient Name: Date of Service: [name] [name] 08/20/2022 12:45 PM Medical Record Number: [id number] Patient Account Number: [id number] Date of Birth/Sex: Treating RN: [name][birth year] (67 y.o. [name] Primary Care Marrianne Sica: [name] Other Clinician: Jilberto, Labine ([id number]) [id number] Page 3 of 10 Referring Laban Orourke: Treating Kimarion Chery/Extender: RO BSO N, [name] G Polite, Ronald Weeks in Treatment: 5 Encounter Discharge Information Items Post Procedure Vitals Discharge Condition: Stable Temperature (F): 97.8 Ambulatory Status: Ambulatory Pulse (bpm): 73 Discharge Destination: Home Respiratory Rate (breaths/min): 18 Transportation: Private Auto Blood Pressure (mmHg): 140/91 Accompanied By: self Schedule Follow-up Appointment: Yes Clinical Summary of Care: Electronic Signature(s) Signed: 08/20/2022 3:52:49 PM By: [name] Entered By: [name] on 03/[name]/2024 13:59:51 -------------------------------------------------------------------------------- Lower Extremity Assessment Details Patient Name: Date of Service: [name] 08/20/2022 12:45 PM Medical Record Number: [id number] Patient Account Number: [id number] Date of Birth/Sex: Treating RN: [birth year][name] (67 y.o. [name] Primary Care Elhadj Girton: [name] Other Clinician: Massie [name] Referring  Syrina Wake: Treating Pruitt Taboada/Extender: RO BSO N, [name] [name], [name] in Treatment: 5 Edema Assessment Assessed: [Left: Yes] [Right: Yes] Edema: [Left: Yes] [Right: Yes] Calf Left: Right: Point of Measurement: 35 cm From Medial Instep 62.5 cm 65.5 cm Ankle Left: Right: Point of Measurement: 10 cm From Medial Instep 33 cm 33.2 cm Vascular Assessment Pulses: Dorsalis Pedis Palpable: [Left:Yes] [Right:Yes] Electronic Signature(s) Signed: 08/20/2022 3:52:49 PM By: [name] Signed: 08/21/2022 3:20:50 PM By: [name], [name], RN, [name], [name], [name] Entered By: [name] on 03/[name]/2024 13:12:48 [name] ([id number]) [id number] Page 4 of 10 -------------------------------------------------------------------------------- Multi Wound Chart Details Patient Name: Date of Service: [name] 08/20/2022 12:45 PM Medical Record Number: [id number] Patient Account Number: [id number] Date of Birth/Sex: Treating RN: [name][birth year] (67 y.o. [name], [name] Primary Care Zaleah Ternes: [name] Other Clinician: Massie [name] Referring Prabhleen Montemayor: Treating Sheli Dorin/Extender: RO BSO N, [name] [name], [name] in Treatment: 5 Vital Signs Height(in): 73 Pulse(bpm): 73 Weight(lbs): 358 Blood Pressure(mmHg): 140/91 Body Mass Index(BMI): 47.2 Temperature(F): 97.8 Respiratory Rate(breaths/min): 18 [1:Photos:] [N/A:N/A] Right, Anterior Lower Leg Left, Circumferential Lower Leg N/A Wound Location: Gradually Appeared Gradually Appeared N/A Wounding Event: Diabetic Wound/Ulcer of the Lower Diabetic Wound/Ulcer of the Lower N/A Primary Etiology: Extremity Extremity Arrhythmia, Congestive Heart Failure, Arrhythmia, Congestive Heart Failure, N/A Comorbid History: Hypertension, Type II Diabetes Hypertension, Type II Diabetes 06/15/2022 06/15/2022 N/A Date Acquired: 5 5 N/A Weeks of Treatment: Open Open N/A Wound Status: No No N/A Wound  Recurrence: 0.4x0.5x0.1 7.8x21.7x0.2 N/A Measurements L x W x D (cm) 0.157 132.936 N/A A (cm) : Matthew 0.016 26.587 N/A Volume (cm) : 99.70% 77.90% N/A % Reduction in A Matthew: 99.70% 55.70% N/A % Reduction in Volume: Grade 1 Grade 1 N/A Classification: Medium Medium N/A Exudate A mount: Serosanguineous Serosanguineous N/A Exudate Type: red, brown red, brown N/A Exudate Color: Medium (34-66%) Medium (34-66%) N/A Granulation A mount: Red Red N/A Granulation Quality: Medium (34-66%) Medium (34-66%) N/A Necrotic A mount: Fat Layer (Subcutaneous Tissue): Yes Fat Layer (Subcutaneous Tissue): Yes N/A Exposed Structures: Fascia: No Fascia: No Tendon: No Tendon: No Muscle: No Muscle: No Joint: No Joint:  No Bone: No Bone: No None None N/A Epithelialization: NICHOLAS, RESENDES ([id number]) [phone].pdf Page 5 of 10 Treatment Notes Electronic Signature(s) Signed: 08/20/2022 3:52:49 PM By: [name] Entered By: [name] on 03/[name]/2024 13:12:52 -------------------------------------------------------------------------------- Multi-Disciplinary Care Plan Details Patient Name: Date of Service: [name] [name] 08/20/2022 12:45 PM Medical Record Number: [id number] Patient Account Number: [id number] Date of Birth/Sex: Treating RN: [name]-[birth year] (67 y.o. [name] Primary Care [place] Landgrebe: [name] Other Clinician: Massie [name] Referring Shivali Quackenbush: Treating Sun Wilensky/Extender: RO BSO N, [name] Lorenza Evangelist in Treatment: 5 Active Inactive Necrotic Tissue Nursing Diagnoses: Knowledge deficit related to management of necrotic/devitalized tissue Goals: Necrotic/devitalized tissue will be minimized in the wound bed Date Initiated: 07/16/2022 Target Resolution Date: 08/14/2022 Goal Status: Active Patient/caregiver will verbalize understanding of reason and process for debridement of necrotic tissue Date Initiated: 07/16/2022 Target  Resolution Date: 08/14/2022 Goal Status: Active Interventions: Assess patient pain level pre-, during and post procedure and prior to discharge Provide education on necrotic tissue and debridement process Notes: Wound/Skin Impairment Nursing Diagnoses: Knowledge deficit related to ulceration/compromised skin integrity Goals: Patient/caregiver will verbalize understanding of skin care regimen Date Initiated: 07/16/2022 Target Resolution Date: 08/14/2022 Goal Status: Active Ulcer/skin breakdown will have a volume reduction of 30% by week 4 Date Initiated: 07/16/2022 Target Resolution Date: 08/14/2022 Goal Status: Active Ulcer/skin breakdown will have a volume reduction of 50% by week 8 Date Initiated: 07/16/2022 Target Resolution Date: 09/14/2022 Goal Status: Active Ulcer/skin breakdown will have a volume reduction of 80% by week 12 Date Initiated: 07/16/2022 Target Resolution Date: 10/14/2022 Goal Status: Active Ulcer/skin breakdown will heal within 14 weeks Date Initiated: 07/16/2022 Target Resolution Date: 11/14/2022 Goal Status: Active Interventions: Assess patient/caregiver ability to obtain necessary supplies [name], [name] ([id number]) [phone].pdf Page 6 of 10 Assess patient/caregiver ability to perform ulcer/skin care regimen upon admission and as needed Assess ulceration(s) every visit Notes: Electronic Signature(s) Signed: 08/20/2022 3:52:49 PM By: [name] Signed: 08/21/2022 3:20:50 PM By: [name], [name], RN, [name], [name], [name] Entered By: [name] on 03/[name]/2024 13:57:33 -------------------------------------------------------------------------------- Pain Assessment Details Patient Name: Date of Service: [name]. 08/20/2022 12:45 PM Medical Record Number: [id number] Patient Account Number: [id number] Date of Birth/Sex: Treating RN: [birth year]/[name] (67 y.o. [name] Primary Care Meriel Kelliher: [name] Other Clinician: Massie [name] Referring  Jayr Lupercio: Treating Flynn Lininger/Extender: RO BSO N, [name] [name], [name] in Treatment: 5 Active Problems Location of Pain Severity and Description of Pain Patient Has Paino Yes Site Locations Pain Location: Pain in Ulcers Duration of the Pain. Constant / Intermittento Constant Rate the pain. Current Pain Level: 5 Character of Pain Describe the Pain: Aching, Dull Pain Management and Medication Current Pain Management: Medication: Yes Cold Application: No Rest: No Massage: No Activity: No T.E.N.S.: No Heat Application: No Leg drop or elevation: No Is the Current Pain Management Adequate: Inadequate How does your wound impact your activities of daily livingo Sleep: No Bathing: No Appetite: No Relationship With Others: No Bladder Continence: No Emotions: No Bowel Continence: No Work: No Toileting: No Drive: No Dressing: No Hobbies: No [name], [name] ([id number]) [id number] Page 7 of 10 Electronic Signature(s) Signed: 08/20/2022 3:52:49 PM By: [name] Signed: 08/21/2022 3:20:50 PM By: [name], [name], RN, [name], [name], [name] Entered By: [name] on 03/[name]/2024 12:59:53 -------------------------------------------------------------------------------- Patient/Caregiver Education Details Patient Name: Date of Service: [name] 3/7/2024andnbsp12:45 PM Medical Record Number: [id number] Patient Account Number: [id number] Date of Birth/Gender: Treating RN:  [birth year] (67 y.o. [name] Primary Care Physician: [name] Other Clinician: Massie [name] Referring Physician: Treating Physician/Extender: RO BSO Delane Ginger, [name] [name], [name] in Treatment: 5 Education Assessment Education Provided To: Patient Education Topics Provided Wound/Skin Impairment: Handouts: Other: continue wound care as directed Methods: Explain/Verbal Responses: State content correctly Electronic Signature(s) Signed: 08/20/2022 3:52:49  PM By: [name] Entered By: [name] on 03/[name]/2024 13:57:55 -------------------------------------------------------------------------------- Wound Assessment Details Patient Name: Date of Service: Matthew Herb J. 08/20/2022 12:45 PM Medical Record Number: [id number] Patient Account Number: [id number] Date of Birth/Sex: Treating RN: 25-Matthew-[name] (67 y.o. [name] Primary Care Dayson Aboud: [name] Other Clinician: Massie [name] Referring Lanyla Costello: Treating Neil Brickell/Extender: RO BSO N, [name] [name], [name] in Treatment: 5 Wound Status Wound Number: 1 Primary Diabetic Wound/Ulcer of the Lower Extremity Etiology: Wound Location: Right, Anterior Lower Leg Wound Status: Open Wounding Event: Gradually Appeared Comorbid Arrhythmia, Congestive Heart Failure, Hypertension, Type II Date Acquired: 06/15/2022 [name], [name] ([id number]) [id number] Page 8 of 10 Date Acquired: 06/15/2022 History: Diabetes Weeks Of Treatment: 5 Clustered Wound: No Photos Wound Measurements Length: (cm) 0.4 Width: (cm) 0.5 Depth: (cm) 0.1 Area: (cm) 0.157 Volume: (cm) 0.016 % Reduction in Area: 99.7% % Reduction in Volume: 99.7% Epithelialization: None Wound Description Classification: Grade 1 Exudate Amount: Medium Exudate Type: Serosanguineous Exudate Color: red, brown Foul Odor After Cleansing: No Slough/Fibrino Yes Wound Bed Granulation Amount: Medium (34-66%) Exposed Structure Granulation Quality: Red Fascia Exposed: No Necrotic Amount: Medium (34-66%) Fat Layer (Subcutaneous Tissue) Exposed: Yes Tendon Exposed: No Muscle Exposed: No Joint Exposed: No Bone Exposed: No Treatment Notes Wound #1 (Lower Leg) Wound Laterality: Right, Anterior Cleanser Soap and Water Discharge Instruction: Gently cleanse wound with antibacterial soap, rinse and pat dry prior to dressing wounds Peri-Wound Care Topical Keystone gel Primary  Dressing AquacelAg Advantage Dressing, 4x5 (in/in) Discharge Instruction: Apply to wound as directed Secondary Dressing ABD Pad 5x9 (in/in) Discharge Instruction: Cover with ABD pad Secured With Tubigrip Size G, 4.5x10 (in/yd) Discharge Instruction: Apply Tubigrip G 3-finger-widths below knee to base of toes to secure dressing and/or for swelling. Compression Wrap Compression Stockings Add-Ons Electronic Signature(s) Signed: 08/20/2022 3:52:49 PM By: [name] Signed: 08/21/2022 3:20:50 PM By: [name], [name], RN, [name], [name], [name] Entered By: [name] on 03/[name]/2024 13:10:54 [name] ([id number]) [id number] Page 9 of 10 -------------------------------------------------------------------------------- Wound Assessment Details Patient Name: Date of Service: [name] 08/20/2022 12:45 PM Medical Record Number: [id number] Patient Account Number: [id number] Date of Birth/Sex: Treating RN: [name], [name] (67 y.o. [name], [name] Primary Care Muneeb Veras: [name] Other Clinician: Massie [name] Referring Khamila Bassinger: Treating Reagyn Facemire/Extender: RO BSO N, [name] [name], [name] in Treatment: 5 Wound Status Wound Number: 2 Primary Diabetic Wound/Ulcer of the Lower Extremity Etiology: Wound Location: Left, Circumferential Lower Leg Wound Status: Open Wounding Event: Gradually Appeared Comorbid Arrhythmia, Congestive Heart Failure, Hypertension, Type II Date Acquired: 06/15/2022 History: Diabetes Weeks Of Treatment: 5 Clustered Wound: No Photos Wound Measurements Length: (cm) 7.8 Width: (cm) 21.7 Depth: (cm) 0.2 Area: (cm) 132.936 Volume: (cm) 26.587 % Reduction in Area: 77.9% % Reduction in Volume: 55.7% Epithelialization: None Wound Description Classification: Grade 1 Exudate Amount: Medium Exudate Type: Serosanguineous Exudate Color: red, brown Foul Odor After Cleansing: No Slough/Fibrino Yes Wound Bed Granulation Amount:  Medium (34-66%) Exposed Structure Granulation Quality: Red Fascia Exposed: No Necrotic Amount: Medium (34-66%) Fat Layer (Subcutaneous Tissue) Exposed: Yes Tendon Exposed: No Muscle Exposed: No Joint Exposed:  No Bone Exposed: No Treatment Notes Wound #2 (Lower Leg) Wound Laterality: Left, Circumferential Cleanser Soap and Water Discharge Instruction: Gently cleanse wound with antibacterial soap, rinse and pat dry prior to dressing wounds RASOOL, PENNEL ([id number]) [id number].pdf Page 10 of 10 Peri-Wound Care Topical Keystone gel Primary Dressing AquacelAg Advantage Dressing, 4x5 (in/in) Discharge Instruction: Apply to wound as directed Secondary Dressing ABD Pad 5x9 (in/in) Discharge Instruction: Cover with ABD pad Secured With Tubigrip Size G, 4.5x10 (in/yd) Discharge Instruction: Apply Tubigrip G 3-finger-widths below knee to base of toes to secure dressing and/or for swelling. Compression Wrap Compression Stockings Add-Ons Electronic Signature(s) Signed: 08/20/2022 3:52:49 PM By: [name] Signed: 08/21/2022 3:20:50 PM By: [name], [name], RN, [name], [name], [name] Entered By: [name] on 03/[name]/2024 13:11:46 -------------------------------------------------------------------------------- Vitals Details Patient Name: Date of Service: [name] [name] 08/20/2022 12:45 PM Medical Record Number: [id number] Patient Account Number: [id number] Date of Birth/Sex: Treating RN: [birth year]-[name] (68 y.o. [name] Primary Care Kailey Esquilin: [name] Other Clinician: Massie [name] Referring Finas Delone: Treating Deltha Bernales/Extender: RO BSO N, [name] [name], [name] in Treatment: 5 Vital Signs Time Taken: 12:55 Temperature (F): 97.8 Height (in): 73 Pulse (bpm): 73 Weight (lbs): 358 Respiratory Rate (breaths/min): 18 Body Mass Index (BMI): 47.2 Blood Pressure (mmHg): 140/91 Reference Range: 80 - 120 mg / dl Electronic Signature(s) Signed:  08/20/2022 3:52:49 PM By: [name] Entered By: [name] on 03/[name]/2024 12:59:46

## 2022-08-22 NOTE — Progress Notes (Signed)
Matthew, [name] ([id number]) [id number] Page 1 of 6 Visit Report for 08/20/2022 Debridement Details Patient Name: Date of Service: [name] 08/20/2022 12:45 PM Medical Record Number: [id number] Patient Account Number: [id number] Date of Birth/Sex: Treating RN: Mar 21, 1956 (67 y.o. [name] Primary Care Provider: [name] Other Clinician: Massie Kluver Referring Provider: Treating Provider/Extender: RO BSO N, MICHA [employer], Lowell Bouton in Treatment: 5 Debridement Performed for Assessment: Wound #2 Left,Circumferential Lower Leg Performed By: Physician Ricard Dillon, MD Debridement Type: Debridement Severity of Tissue Pre Debridement: Fat layer exposed Level of Consciousness (Pre-procedure): Awake and Alert Pre-procedure Verification/Time Out Yes - 13:27 Taken: Start Time: 13:27 T Area Debrided (L x W): otal 3 (cm) x 5 (cm) = 15 (cm) Tissue and other material debrided: Viable, Non-Viable Level: Non-Viable Tissue Debridement Description: Selective/Open Wound Instrument: Curette Bleeding: Minimum Hemostasis Achieved: Pressure Response to Treatment: Procedure was tolerated well Level of Consciousness (Post- Awake and Alert procedure): Post Debridement Measurements of Total Wound Length: (cm) 7.8 Width: (cm) 21.7 Depth: (cm) 0.2 Volume: (cm) 26.587 Character of Wound/Ulcer Post Debridement: Stable Severity of Tissue Post Debridement: Fat layer exposed Post Procedure Diagnosis Same as Pre-procedure Electronic Signature(s) Signed: 08/20/2022 3:52:49 PM By: Massie Kluver Signed: 08/20/2022 4:44:08 PM By: Linton Ham MD Signed: 08/21/2022 3:20:50 PM By: [name], [name], RN, [name], [name], [name] Entered By: Massie Kluver on 08/20/2022 13:28:29 Physical Exam Details -------------------------------------------------------------------------------- [name] ([id number]) [id number] Page 2 of 6 Patient  Name: Date of Service: [name] 08/20/2022 12:45 PM Medical Record Number: [id number] Patient Account Number: [id number] Date of Birth/Sex: Treating RN: February 18, 1956 (67 y.o. [name] Primary Care Provider: [name] Other Clinician: Massie Kluver Referring Provider: Treating Provider/Extender: RO BSO N, MICHA [employer], [employer]ell Bouton in Treatment: 5 Cardiovascular Pedal pulses palpable and strong bilaterally.. Notes . Wound exam; the predominant area is on the left lateral lower extremity small areas around this. I used a #5 curette to debride the major wound to a better looking wound surface. The area on the right is also smaller. The patient has severe lymphedema chronic venous insufficiency. Electronic Signature(s) Signed: 08/20/2022 4:44:08 PM By: Linton Ham MD Entered By: Linton Ham on 08/20/2022 13:34:17 -------------------------------------------------------------------------------- Physician Orders Details Patient Name: Date of Service: [name]. 08/20/2022 12:45 PM Medical Record Number: [id number] Patient Account Number: [id number] Date of Birth/Sex: Treating RN: [birth year] (67 y.o. [name] Primary Care Provider: [name] Other Clinician: Massie Kluver Referring Provider: Treating Provider/Extender: RO BSO N, MICHA [employer], Lowell Bouton in Treatment: 5 Verbal / Phone Orders: No Diagnosis Coding Follow-up Appointments Return Appointment in 1 week. Nurse Visit as needed Bathing/ Shower/ Hygiene May shower with wound dressing protected with water repellent cover or cast protector. Anesthetic (Use 'Patient Medications' Section for Anesthetic Order Entry) Lidocaine applied to wound bed Edema Control - Lymphedema / Segmental Compressive Device / Other Elevate, Exercise Daily and A void Standing for Long Periods of Time. Elevate legs to the level of the heart and pump ankles as often as possible Elevate leg(s) parallel to  the floor when sitting. Compression Pump: Use compression pump on left lower extremity for 60 minutes, twice daily. Compression Pump: Use compression pump on right lower extremity for 60 minutes, twice daily. DO YOUR BEST to sleep in the bed at night. DO NOT sleep in your recliner. Long hours of sitting in a recliner leads to swelling of the legs and/or potential  wounds on your backside. Wound Treatment Wound #1 - Lower Leg Wound Laterality: Right, Anterior Cleanser: Soap and Water 3 x Per Week/30 Days Discharge Instructions: Gently cleanse wound with antibacterial soap, rinse and pat dry prior to dressing wounds Topical: Keystone gel 3 x Per Week/30 Days Prim Dressing: AquacelAg Advantage Dressing, 4x5 (in/in) 3 x Per Week/30 Days ary Discharge Instructions: Apply to wound as directed Secondary Dressing: ABD Pad 5x9 (in/in) 3 x Per Week/30 Days Discharge Instructions: Cover with ABD pad [name], [name] ([id number]) [id number] Page 3 of 6 Secured With: Tubigrip Size G, 4.5x10 (in/yd) 3 x Per Week/30 Days Discharge Instructions: Apply Tubigrip G 3-finger-widths below knee to base of toes to secure dressing and/or for swelling. Wound #2 - Lower Leg Wound Laterality: Left, Circumferential Cleanser: Soap and Water 3 x Per Week/30 Days Discharge Instructions: Gently cleanse wound with antibacterial soap, rinse and pat dry prior to dressing wounds Topical: Keystone gel 3 x Per Week/30 Days Prim Dressing: AquacelAg Advantage Dressing, 4x5 (in/in) (DME) (Dispense As Written) 3 x Per Week/30 Days ary Discharge Instructions: Apply to wound as directed Secondary Dressing: ABD Pad 5x9 (in/in) 3 x Per Week/30 Days Discharge Instructions: Cover with ABD pad Secured With: Tubigrip Size G, 4.5x10 (in/yd) 3 x Per Week/30 Days Discharge Instructions: Apply Tubigrip G 3-finger-widths below knee to base of toes to secure dressing and/or for swelling. Electronic  Signature(s) Signed: 08/20/2022 3:52:49 PM By: Massie Kluver Signed: 08/20/2022 4:44:08 PM By: Linton Ham MD Entered By: Massie Kluver on 08/20/2022 13:34:01 -------------------------------------------------------------------------------- Problem List Details Patient Name: Date of Service: [name]. 08/20/2022 12:45 PM Medical Record Number: [id number] Patient Account Number: [id number] Date of Birth/Sex: Treating RN: 08/22/1955 (67 y.o. 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/06/2022 No Yes L97.812 Non-pressure chronic ulcer of other part of right lower leg with fat layer 07/16/2022 No Yes exposed [place] (primary) hypertension 07/16/2022 No Yes I50.42 Chronic combined systolic (congestive) and diastolic (congestive) heart failure 07/16/2022 No Yes YAGO, URREGO ([id number]) [id number] Page 4 of 6 I49.9 Cardiac arrhythmia, unspecified 07/16/2022 No Yes Inactive Problems Resolved Problems Electronic Signature(s) Signed: 08/20/2022 4:44:08 PM By: Linton Ham MD Entered By: Linton Ham on 08/20/2022 13:32:13 -------------------------------------------------------------------------------- Progress Note Details Patient Name: Date of Service: [name]. 08/20/2022 12:45 PM Medical Record Number: [id number] Patient Account Number: [id number] Date of Birth/Sex: Treating RN: [birth year] (68 y.o. [name] Primary Care Provider: [name] Other Clinician: Massie Kluver Referring  Provider: Treating Provider/Extender: RO BSO N, MICHA [employer], Lowell Bouton in Treatment: 5 Subjective Objective Constitutional Vitals Time Taken: 12:55 PM, Height: 73 in, Weight: 358 lbs, BMI: 47.2, Temperature: 97.8 F, Pulse: 73 bpm, Respiratory Rate: 18 breaths/min, Blood Pressure: 140/91 mmHg. Cardiovascular Pedal pulses palpable and strong bilaterally.. General Notes: . Wound exam; the predominant area is on the left lateral lower extremity small areas around this. I used a #5 curette to debride the major wound to a better looking wound surface. The area on the right is also smaller. The patient has severe lymphedema chronic venous insufficiency. Integumentary (Hair, Skin) Wound #1  status is Open. Original cause of wound was Gradually Appeared. The date acquired was: 06/15/2022. The wound has been in treatment 5 weeks. The wound is located on the Right,Anterior Lower Leg. The wound measures 0.4cm length x 0.5cm width x 0.1cm depth; 0.157cm^2 area and 0.016cm^3 volume. There is Fat Layer (Subcutaneous Tissue) exposed. There is a medium amount of serosanguineous drainage noted. There is medium (34-66%) red granulation within the wound bed. There is a medium (34-66%) amount of necrotic tissue within the wound bed. Wound #2 status is Open. Original cause of wound was Gradually Appeared. The date acquired was: 06/15/2022. The wound has been in treatment 5 weeks. The wound is located on the Left,Circumferential Lower Leg. The wound measures 7.8cm length x 21.7cm width x 0.2cm depth; 132.936cm^2 area and 26.587cm^3 volume. There is Fat Layer (Subcutaneous Tissue) exposed. There is a medium amount of serosanguineous drainage noted. There is medium (34-66%) red granulation within the wound bed. There is a medium (34-66%) amount of necrotic tissue within the wound bed. Assessment [address] DOW, REIBEL ([id number]) [id number] Page 5 of 6 ICD-10 Type 2  diabetes mellitus with other skin ulcer Lymphedema, not elsewhere classified Non-pressure chronic ulcer of other part of left lower leg with fat layer exposed Non-pressure chronic ulcer of other part of right lower leg with fat layer exposed Essential (primary) hypertension Chronic combined systolic (congestive) and diastolic (congestive) heart failure Cardiac arrhythmia, unspecified Procedures Wound #2 Pre-procedure diagnosis of Wound #2 is a Diabetic Wound/Ulcer of the Lower Extremity located on the Left,Circumferential Lower Leg .Severity of Tissue Pre Debridement is: Fat layer exposed. There was a Selective/Open Wound Non-Viable Tissue Debridement with a total area of 15 sq cm performed by Ricard Dillon, MD. With the following instrument(s): Curette to remove Viable and Non-Viable tissue/material.. A time out was conducted at 13:27, prior to the start of the procedure. A Minimum amount of bleeding was controlled with Pressure. The procedure was tolerated well. Post Debridement Measurements: 7.8cm length x 21.7cm width x 0.2cm depth; 26.587cm^3 volume. Character of Wound/Ulcer Post Debridement is stable. Severity of Tissue Post Debridement is: Fat layer exposed. Post procedure Diagnosis Wound #2: Same as Pre-Procedure Plan Follow-up Appointments: Return Appointment in 1 week. Nurse Visit as needed Bathing/ Shower/ Hygiene: May shower with wound dressing protected with water repellent cover or cast protector. Anesthetic (Use 'Patient Medications' Section for Anesthetic Order Entry): Lidocaine applied to wound bed Edema Control - Lymphedema / Segmental Compressive Device / Other: Elevate, Exercise Daily and Avoid Standing for Long Periods of Time. Elevate legs to the level of the heart and pump ankles as often as possible Elevate leg(s) parallel to the floor when sitting. Compression Pump: Use compression pump on left lower extremity for 60 minutes, twice daily. Compression Pump:  Use compression pump on right lower extremity for 60 minutes, twice daily. DO YOUR BEST to sleep in the bed at night. DO NOT sleep in your recliner. Long hours of sitting in a recliner leads to swelling of the legs and/or potential wounds on your backside. WOUND #1: - Lower Leg Wound Laterality: Right, Anterior Cleanser: Soap and Water 3 x Per Week/30 Days Discharge Instructions: Gently cleanse wound with antibacterial soap, rinse and pat dry prior to dressing wounds Topical: Keystone gel 3 x Per Week/30 Days Prim Dressing: AquacelAg Advantage Dressing, 4x5 (in/in) 3 x Per Week/30 Days ary Discharge Instructions: Apply to wound as directed Secondary Dressing: ABD Pad 5x9 (in/in) 3 x Per Week/30 Days Discharge Instructions: Cover  with ABD pad Secured With: Tubigrip Size G, 4.5x10 (in/yd) 3 x Per Week/30 Days Discharge Instructions: Apply Tubigrip G 3-finger-widths below knee to base of toes to secure dressing and/or for swelling. WOUND #2: - Lower Leg Wound Laterality: Left, Circumferential Cleanser: Soap and Water 3 x Per Week/30 Days Discharge Instructions: Gently cleanse wound with antibacterial soap, rinse and pat dry prior to dressing wounds Topical: Keystone gel 3 x Per Week/30 Days Prim Dressing: AquacelAg Advantage Dressing, 4x5 (in/in) (DME) (Dispense As Written) 3 x Per Week/30 Days ary Discharge Instructions: Apply to wound as directed Secondary Dressing: ABD Pad 5x9 (in/in) 3 x Per Week/30 Days Discharge Instructions: Cover with ABD pad Secured With: Tubigrip Size G, 4.5x10 (in/yd) 3 x Per Week/30 Days Discharge Instructions: Apply Tubigrip G 3-finger-widths below knee to base of toes to secure dressing and/or for swelling. 1. Keystone, Aquacel Ag, ABD and Tubigrip. 2. I have asked him to use his compression pumps twice a day 3. There is no evidence of infection here. Electronic Signature(s) Signed: 08/20/2022 4:44:08 PM By: Linton Ham MD Entered By: Linton Ham on  08/20/2022 13:35:28 [name] ([id number]) [id number] Page 6 of 6 -------------------------------------------------------------------------------- SuperBill Details Patient Name: Date of Service: [name] 08/20/2022 Medical Record Number: [id number] Patient Account Number: [id number] Date of Birth/Sex: Treating RN: 1955-07-14 (67 y.o. Isac Sarna, Maudie Mercury Primary Care Provider: [name] Other Clinician: Massie Kluver Referring Provider: Treating Provider/Extender: RO BSO N, MICHA EL [employer] in Treatment: 5 Diagnosis Coding ICD-10 Codes Code Description E11.622 Type 2 diabetes mellitus with other skin ulcer I89.0 Lymphedema, not elsewhere classified L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 L97.812 Non-pressure chronic ulcer of other part of right lower leg with fat layer exposed I10 Essential (primary) hypertension I50.42 Chronic combined systolic (congestive) and diastolic (congestive) heart failure I49.9 Cardiac arrhythmia, unspecified Facility Procedures : CPT4 Code: [id number] Description: [id number] - DEBRIDE WOUND 1ST 20 SQ CM OR < ICD-10 Diagnosis Description I89.0 Lymphedema, not elsewhere classified L97.822 Non-pressure chronic ulcer of other part of left lower leg with fat layer expose Modifier: d Quantity: 1 Physician Procedures : CPT4 Code Description Modifier [id number] - WC PHYS DEBR WO ANESTH 20 SQ CM ICD-10 Diagnosis Description I89.0 Lymphedema, not elsewhere classified L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 Quantity: 1 Electronic Signature(s) Signed: 08/20/2022 4:44:08 PM By: Linton Ham MD Entered By: Linton Ham on 08/20/2022 13:35:56

## 2022-09-03 ENCOUNTER — Encounter: Payer: PPO | Admitting: Physician Assistant

## 2022-09-03 DIAGNOSIS — E11622 Type 2 diabetes mellitus with other skin ulcer: Secondary | ICD-10-CM | POA: Diagnosis not present

## 2022-09-03 DIAGNOSIS — L97822 Non-pressure chronic ulcer of other part of left lower leg with fat layer exposed: Secondary | ICD-10-CM | POA: Diagnosis not present

## 2022-09-04 NOTE — Progress Notes (Addendum)
COCHISE, WESP ([id number]) [id number] Page 1 of 8 Visit Report for 09/03/2022 Chief Complaint Document Details Patient Name: Date of Service: [name] 09/03/2022 1:45 PM Medical Record Number: [id number] Patient Account Number: [id number] Date of Birth/Sex: Treating RN: [birth year] (67 y.o. [name] Primary Care Provider: [name] Other Clinician: Massie Kluver Referring Provider: Treating Provider/Extender: [name] in Treatment: 7 Information Obtained from: Patient Chief Complaint Bilateral LE ulcers and lymphedema Electronic Signature(s) Signed: 09/03/2022 2:29:01 PM By: Worthy Keeler PA-C Entered By: Worthy Keeler on 09/03/2022 14:29:00 -------------------------------------------------------------------------------- Debridement Details Patient Name: Date of Service: Matthew Broom [name] 09/03/2022 1:45 PM Medical Record Number: [id number] Patient Account Number: [id number] Date of Birth/Sex: Treating RN: 05/07/1956 (67 y.o. [name] Primary Care Provider: [name] Other Clinician: Massie Kluver Referring Provider: Treating Provider/Extender: [name] in Treatment: 7 Debridement Performed for Assessment: Wound #2 Left,Circumferential Lower Leg Performed By: Physician Tommie Sams., PA-C Debridement Type: Debridement Severity of Tissue Pre Debridement: Fat layer exposed Level of Consciousness (Pre-procedure): Awake and Alert Pre-procedure Verification/Time Out Yes - 14:32 Taken: Start Time: 14:32 T Area Debrided (L x W): otal 4 (cm) x 5 (cm) = 20 (cm) Tissue and other material debrided: Viable, Non-Viable, Slough, Subcutaneous, Slough Level: Skin/Subcutaneous Tissue Debridement Description: Excisional Instrument: Curette Bleeding: Minimum Hemostasis Achieved: Pressure Response to Treatment: Procedure was tolerated well Level of Consciousness (Post- Awake  and Alert procedure): ZARREN, PENFOLD ([id number]) [id number] Page 2 of 8 Post Debridement Measurements of Total Wound Length: (cm) 8.5 Width: (cm) 25.4 Depth: (cm) 0.3 Volume: (cm) 50.87 Character of Wound/Ulcer Post Debridement: Stable Severity of Tissue Post Debridement: Fat layer exposed Post Procedure Diagnosis Same as Pre-procedure Electronic Signature(s) Signed: 09/03/2022 4:10:18 PM By: Massie Kluver Signed: 09/03/2022 4:35:37 PM By: [name], [name], RN, [name], [name], [name] Signed: 09/03/2022 5:06:45 PM By: Worthy Keeler PA-C Entered By: Massie Kluver on 09/03/2022 14:36:37 -------------------------------------------------------------------------------- HPI Details Patient Name: Date of Service: Matthew Broom [name] 09/03/2022 1:45 PM Medical Record Number: [id number] Patient Account Number: [id number] Date of Birth/Sex: Treating RN: 09-07-1955 (67 y.o. [name] Primary Care Provider: [name] Other Clinician: Massie Kluver Referring Provider: Treating Provider/Extender: [name] in Treatment: 7 History of Present Illness HPI Description: 07-16-2022 upon evaluation today patient appears to be doing poorly currently in regard to his his legs. The left leg is actually worse than the right. This is a patient that is previously been seen in the [place] office and at that point was doing really quite well with compression wrapping and often alginate are either Hydrofera Blue dressings. Fortunately he has gone since November 2020 until just current with out having any issue or need for wound care services which is great news. Unfortunately he is here today because he is having some issues at this point. Patient does have a history of several medical conditions which include diabetes mellitus type 2, lymphedema, hypertension, congestive heart failure, and cardiac arrhythmia though is not sure that this is actually atrial  fibrillation based on what he has been told. He does have lymphedema pumps but tells me he has not used them since the summer when he had to move to a remodel of his building and subsequently never unpacked them. 07-23-2022 upon evaluation today patient appears to be doing well with regard to his legs. He is going require some sharp debridement today but overall seems to be making  some progress. I am pleased in that regard he has much less drainage that he had did last week I think the compression wraps are definitely helping. 07-30-2022 upon evaluation today patient appears to be doing poorly in regard to his legs he actually has blue-green drainage which I think is consistent with Pseudomonas. I believe that we need to address this as soon as possible and I discussed that with the patient today as well. Fortunately I do not see any signs of active infection locally nor systemically at this time which is great news. No fevers, chills, nausea, vomiting, or diarrhea. 08-13-2022 upon evaluation today patient appears to be doing well currently in regard to his leg ulcers which are actually looking much better. Fortunately there does not appear to be any signs of active infection at this time which is great news. No fevers, chills, nausea, vomiting, or diarrhea. 3/7; patient with predominantly a left medial lower extremity wound as well as several smaller areas and a small area on the right lateral. His [employer] came in today. We are using silver alginate as the primary dressing 09-03-2022 upon evaluation today patient appears to be doing well currently in regard to his wounds. He has been tolerating the dressing changes without complication. Fortunately there does not appear to be any signs of infection there is needed however for sharp debridement. Electronic Signature(s) Signed: 09/03/2022 2:58:09 PM By: Worthy Keeler PA-C Entered By: Worthy Keeler on 09/03/2022 14:58:08 [name] ([id number])  [id number] Page 3 of 8 -------------------------------------------------------------------------------- Physical Exam Details Patient Name: Date of Service: [name] 09/03/2022 1:45 PM Medical Record Number: [id number] Patient Account Number: [id number] Date of Birth/Sex: Treating RN: 1956/05/24 (66 y.o. [name] Primary Care Provider: [name] Other Clinician: Massie Kluver Referring Provider: Treating Provider/Extender: [name] in Treatment: 7 Constitutional Well-nourished and well-hydrated in no acute distress. Respiratory normal breathing without difficulty. Psychiatric this patient is able to make decisions and demonstrates good insight into disease process. Alert and Oriented x 3. pleasant and cooperative. Notes Upon inspection patient's wound bed actually showed signs of good granulation position at this point. Fortunately I do not see any evidence of active infection locally nor systemically which is great news and I am very pleased in that regard. Electronic Signature(s) Signed: 09/03/2022 2:58:35 PM By: Worthy Keeler PA-C Entered By: Worthy Keeler on 09/03/2022 14:58:35 -------------------------------------------------------------------------------- Physician Orders Details Patient Name: Date of Service: Matthew Herb J. 09/03/2022 1:45 PM Medical Record Number: [id number] Patient Account Number: [id number] Date of Birth/Sex: Treating RN: [birth year] (67 y.o. [name], [name] Primary Care Provider: [name] Other Clinician: Massie Kluver Referring Provider: Treating Provider/Extender: [name] in Treatment: 7 Verbal / Phone Orders: Yes Clinician: Cornell Barman Read Back and Verified: Yes Diagnosis Coding ICD-10 Coding Code Description E11.622 Type 2 diabetes mellitus with other skin ulcer I89.0 Lymphedema, not elsewhere classified L97.822 Non-pressure chronic  ulcer of other part of left lower leg with fat layer exposed L97.812 Non-pressure chronic ulcer of other part of right lower leg with fat layer exposed I10 Essential (primary) hypertension I50.42 Chronic combined systolic (congestive) and diastolic (congestive) heart failure I49.9 Cardiac arrhythmia, unspecified [name], [name] ([id number]) [id number] Page 4 of 8 Follow-up Appointments Return Appointment in 1 week. Nurse Visit as needed Bathing/ Shower/ Hygiene May shower with wound dressing protected with water repellent cover or cast protector. Anesthetic (Use 'Patient Medications' Section for Anesthetic Order Entry) Lidocaine  applied to wound bed Edema Control - Lymphedema / Segmental Compressive Device / Other Elevate, Exercise Daily and A void Standing for Long Periods of Time. Elevate legs to the level of the heart and pump ankles as often as possible Elevate leg(s) parallel to the floor when sitting. Compression Pump: Use compression pump on left lower extremity for 60 minutes, twice daily. Compression Pump: Use compression pump on right lower extremity for 60 minutes, twice daily. DO YOUR BEST to sleep in the bed at night. DO NOT sleep in your recliner. Long hours of sitting in a recliner leads to swelling of the legs and/or potential wounds on your backside. Wound Treatment Wound #1 - Lower Leg Wound Laterality: Right, Anterior Cleanser: Soap and Water 3 x Per Week/30 Days Discharge Instructions: Gently cleanse wound with antibacterial soap, rinse and pat dry prior to dressing wounds Topical: Keystone gel 3 x Per Week/30 Days Prim Dressing: AquacelAg Advantage Dressing, 4x5 (in/in) 3 x Per Week/30 Days ary Discharge Instructions: Apply to wound as directed Secondary Dressing: ABD Pad 5x9 (in/in) 3 x Per Week/30 Days Discharge Instructions: Cover with ABD pad Secured With: Tubigrip Size G, 4.5x10 (in/yd) 3 x Per Week/30 Days Discharge Instructions:  Apply Tubigrip G 3-finger-widths below knee to base of toes to secure dressing and/or for swelling. Wound #2 - Lower Leg Wound Laterality: Left, Circumferential Cleanser: Soap and Water 3 x Per Week/30 Days Discharge Instructions: Gently cleanse wound with antibacterial soap, rinse and pat dry prior to dressing wounds Topical: Keystone gel 3 x Per Week/30 Days Prim Dressing: AquacelAg Advantage Dressing, 4x5 (in/in) (Dispense As Written) 3 x Per Week/30 Days ary Discharge Instructions: Apply to wound as directed Secondary Dressing: ABD Pad 5x9 (in/in) 3 x Per Week/30 Days Discharge Instructions: Cover with ABD pad Secured With: Tubigrip Size G, 4.5x10 (in/yd) 3 x Per Week/30 Days Discharge Instructions: Apply Tubigrip G 3-finger-widths below knee to base of toes to secure dressing and/or for swelling. Electronic Signature(s) Signed: 09/03/2022 4:10:18 PM By: Massie Kluver Signed: 09/03/2022 5:06:45 PM By: Worthy Keeler PA-C Entered By: Massie Kluver on 09/03/2022 16:09:19 -------------------------------------------------------------------------------- Problem List Details Patient Name: Date of Service: Matthew Broom [name] 09/03/2022 1:45 PM Medical Record Number: [id number] Patient Account Number: [id number] Date of Birth/Sex: Treating RN: December 25, 1955 (67 y.o. [name] Primary Care Provider: [name] Other Clinician: Chares, Pavelko ([id number]) [id number] Page 5 of 8 Referring Provider: Treating Provider/Extender: [name] in Treatment: 7 Active Problems ICD-10 Encounter Code Description Active Date MDM Diagnosis E11.622 Type 2 diabetes mellitus with other skin ulcer 07/16/2022 No Yes I89.0 Lymphedema, not elsewhere classified 07/16/2022 No Yes L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2/06/2022 No Yes L97.812 Non-pressure chronic ulcer of other part of right  lower leg with fat layer 07/16/2022 No Yes exposed I10 Essential (primary) hypertension 07/16/2022 No Yes I50.42 Chronic combined systolic (congestive) and diastolic (congestive) heart failure 07/16/2022 No Yes I49.9 Cardiac arrhythmia, unspecified 07/16/2022 No Yes Inactive Problems Resolved Problems Electronic Signature(s) Signed: 09/03/2022 2:28:57 PM By: Worthy Keeler PA-C Entered By: Worthy Keeler on 09/03/2022 14:28:57 -------------------------------------------------------------------------------- Progress Note Details Patient Name: Date of Service: Matthew Herb J. 09/03/2022 1:45 PM Medical Record Number: [id number] Patient Account Number: [id number] Date of Birth/Sex: Treating RN: 03/21/1956 (67 y.o. [name] Primary Care Provider: [name] Other Clinician: Massie Kluver Referring Provider: Treating Provider/Extender: [name] in Treatment: 7  Subjective Chief Complaint Information obtained from Patient Bilateral LE ulcers and lymphedema EDNA, SPEARS ([id number]) [phone] Page 6 of 8 History of Present Illness (HPI) 07-16-2022 upon evaluation today patient appears to be doing poorly currently in regard to his his legs. The left leg is actually worse than the right. This is a patient that is previously been seen in the [place] office and at that point was doing really quite well with compression wrapping and often alginate are either Hydrofera Blue dressings. Fortunately he has gone since November 2020 until just current with out having any issue or need for wound care services which is great news. Unfortunately he is here today because he is having some issues at this point. Patient does have a history of several medical conditions which include diabetes mellitus type 2, lymphedema, hypertension, congestive heart failure, and cardiac arrhythmia though is not sure that this is actually atrial fibrillation  based on what he has been told. He does have lymphedema pumps but tells me he has not used them since the summer when he had to move to a remodel of his building and subsequently never unpacked them. 07-23-2022 upon evaluation today patient appears to be doing well with regard to his legs. He is going require some sharp debridement today but overall seems to be making some progress. I am pleased in that regard he has much less drainage that he had did last week I think the compression wraps are definitely helping. 07-30-2022 upon evaluation today patient appears to be doing poorly in regard to his legs he actually has blue-green drainage which I think is consistent with Pseudomonas. I believe that we need to address this as soon as possible and I discussed that with the patient today as well. Fortunately I do not see any signs of active infection locally nor systemically at this time which is great news. No fevers, chills, nausea, vomiting, or diarrhea. 08-13-2022 upon evaluation today patient appears to be doing well currently in regard to his leg ulcers which are actually looking much better. Fortunately there does not appear to be any signs of active infection at this time which is great news. No fevers, chills, nausea, vomiting, or diarrhea. 3/7; patient with predominantly a left medial lower extremity wound as well as several smaller areas and a small area on the right lateral. His [employer] came in today. We are using silver alginate as the primary dressing 09-03-2022 upon evaluation today patient appears to be doing well currently in regard to his wounds. He has been tolerating the dressing changes without complication. Fortunately there does not appear to be any signs of infection there is needed however for sharp debridement. Objective Constitutional Well-nourished and well-hydrated in no acute distress. Vitals Time Taken: 1:57 PM, Height: 73 in, Weight: 358 lbs, BMI: 47.2, Temperature: 97.7 F,  Pulse: 69 bpm, Respiratory Rate: 18 breaths/min, Blood Pressure: 116/76 mmHg. Respiratory normal breathing without difficulty. Psychiatric this patient is able to make decisions and demonstrates good insight into disease process. Alert and Oriented x 3. pleasant and cooperative. General Notes: Upon inspection patient's wound bed actually showed signs of good granulation position at this point. Fortunately I do not see any evidence of active infection locally nor systemically which is great news and I am very pleased in that regard. Integumentary (Hair, Skin) Wound #1 status is Open. Original cause of wound was Gradually Appeared. The date acquired was: 06/15/2022. The wound has been in treatment 7 weeks. The wound is located on the  Right,Anterior Lower Leg. The wound measures 1cm length x 1.1cm width x 0.1cm depth; 0.864cm^2 area and 0.086cm^3 volume. There is Fat Layer (Subcutaneous Tissue) exposed. There is a medium amount of serosanguineous drainage noted. There is medium (34-66%) red granulation within the wound bed. There is a medium (34-66%) amount of necrotic tissue within the wound bed. Wound #2 status is Open. Original cause of wound was Gradually Appeared. The date acquired was: 06/15/2022. The wound has been in treatment 7 weeks. The wound is located on the Left,Circumferential Lower Leg. The wound measures 8.5cm length x 25.4cm width x 0.3cm depth; 169.567cm^2 area and 50.87cm^3 volume. There is Fat Layer (Subcutaneous Tissue) exposed. There is a medium amount of serosanguineous drainage noted. There is medium (34-66%) red granulation within the wound bed. There is a medium (34-66%) amount of necrotic tissue within the wound bed. 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 There was a Excisional Skin/Subcutaneous Tissue Debridement with a total area of 20 sq cm performed by Tommie Sams., PA-C. With the following instrument(s): Curette to remove Viable and Non-Viable tissue/material. Material removed includes Subcutaneous Tissue and Slough and. A time out was conducted at 14:32, prior to the start of the procedure. A Minimum amount of bleeding was controlled with Pressure. The procedure was tolerated well. Post Debridement Measurements: 8.5cm length x 25.4cm width x 0.3cm depth; 50.87cm^3 volume. Character of Wound/Ulcer Post Debridement is stable. Severity of Tissue Post Debridement is: Fat layer exposed. Post procedure Diagnosis Wound #2: Same as Pre-Procedure Plan Follow-up Appointments: Return Appointment in 1 week. Nurse Visit as needed Bathing/ Shower/ Hygiene: May shower with wound dressing protected with water repellent cover or cast protector. Anesthetic (Use 'Patient Medications' Section for Anesthetic Order Entry): Lidocaine applied to wound bed Edema Control - Lymphedema / Segmental Compressive Device / Other: Elevate, Exercise Daily and Avoid Standing for Long Periods of Time. Elevate legs to the level of the heart and pump ankles as often as possible Elevate leg(s) parallel to the floor when sitting. Compression Pump: Use compression pump on left lower extremity for 60 minutes, twice daily. Compression Pump: Use compression pump on right lower extremity for 60 minutes, twice daily. DO YOUR  BEST to sleep in the bed at night. DO NOT sleep in your recliner. Long hours of sitting in a recliner leads to swelling of the legs and/or potential wounds on your backside. WOUND #1: - Lower Leg Wound Laterality: Right, Anterior Cleanser: Soap and Water 3 x Per Week/30 Days Discharge Instructions: Gently cleanse wound with antibacterial soap, rinse and pat dry prior to dressing wounds Topical: Keystone gel 3 x Per Week/30 Days Prim Dressing: AquacelAg Advantage Dressing, 4x5 (in/in) 3 x Per Week/30 Days ary Discharge Instructions: Apply to wound as directed Secondary Dressing: ABD Pad 5x9 (in/in) 3 x Per Week/30 Days Discharge Instructions: Cover with ABD pad Secured With: Tubigrip Size G, 4.5x10 (in/yd) 3 x Per Week/30 Days Discharge Instructions: Apply Tubigrip G 3-finger-widths below  knee to base of toes to secure dressing and/or for swelling. WOUND #2: - Lower Leg Wound Laterality: Left, Circumferential Cleanser: Soap and Water 3 x Per Week/30 Days Discharge Instructions: Gently cleanse wound with antibacterial soap, rinse and pat dry prior to dressing wounds Topical: Keystone gel 3 x Per Week/30 Days Prim Dressing: AquacelAg Advantage Dressing, 4x5 (in/in) (Dispense As Written) 3 x Per Week/30 Days ary Discharge Instructions: Apply to wound as directed Secondary Dressing: ABD Pad 5x9 (in/in) 3 x Per Week/30 Days Discharge Instructions: Cover with ABD pad Secured With: Tubigrip Size G, 4.5x10 (in/yd) 3 x Per Week/30 Days Discharge Instructions: Apply Tubigrip G 3-finger-widths below knee to base of toes to secure dressing and/or for swelling. 1. I am recommend currently that the patient should continue to monitor for any signs of infection or worsening. Based on what I am seeing I do believe that we are headed in the right direction. Fortunately I am hopeful that the patient is going to be able to continue to improve I think the [employer] is helping I think that the silver alginate and  ABD pads are doing a good job as well. 2. Will get a continue with the Tubigrip size G which I think is doing really well for him. We will see patient back for reevaluation in 1 week here in the clinic. If anything worsens or changes patient will contact our office for additional recommendations. Electronic Signature(s) Signed: 09/03/2022 2:59:06 PM By: Worthy Keeler PA-C Entered By: Worthy Keeler on 09/03/2022 14:59:06 -------------------------------------------------------------------------------- SuperBill Details Patient Name: Date of Service: [name], CHA RLES Lenna Sciara 09/03/2022 [name] ([id number]) [id number] Page 8 of 8 Medical Record Number: [id number] Patient Account Number: [id number] Date of Birth/Sex: Treating RN: May 17, 1956 (67 y.o. [name], [name] Primary Care Provider: [name] Other Clinician: Massie Kluver Referring Provider: Treating Provider/Extender: [name] in Treatment: 7 Diagnosis Coding ICD-10 Codes Code Description [phone] Type 2 diabetes mellitus with other skin ulcer I89.0 Lymphedema, not elsewhere classified L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 L97.812 Non-pressure chronic ulcer of other part of right lower leg with fat layer exposed I10 Essential (primary) hypertension I50.42 Chronic combined systolic (congestive) and diastolic (congestive) heart failure I49.9 Cardiac arrhythmia, unspecified Facility Procedures : CPT4 Code: [id number] Description: [facility] - DEB SUBQ TISSUE 20 SQ CM/< ICD-10 Diagnosis Description L97.822 Non-pressure chronic ulcer of other part of left lower leg with fat layer expo Modifier: sed Quantity: 1 Physician Procedures : CPT4 Code Description Modifier [id number] - WC PHYS SUBQ TISS 20 SQ CM ICD-10 Diagnosis Description L97.822 Non-pressure chronic ulcer of other part of left lower leg with fat layer  exposed Quantity: 1 Electronic Signature(s) Signed: 09/03/2022 3:00:11 PM By: Worthy Keeler PA-C Entered By: Worthy Keeler on 09/03/2022 15:00:10

## 2022-09-04 NOTE — Progress Notes (Signed)
[name], [name] ([id number]) [id number] Page 1 of 10 Visit Report for 09/03/2022 Arrival Information Details Patient Name: Date of Service: [name] 09/03/2022 1:45 PM Medical Record Number: [id number] Patient Account Number: [id number] Date of Birth/Sex: Treating RN: 31-Jul-1955 (67 y.o. [name] Primary Care [name]: [name] Other Clinician: Massie [name] Referring [name]: Treating [name]/Extender: [name] in Treatment: 7 Visit Information History Since Last Visit All ordered tests and consults were completed: No Patient Arrived: Ambulatory Added or deleted any medications: No Arrival Time: 13:51 Any new allergies or adverse reactions: No Transfer Assistance: None Had a fall or experienced change in No Patient Identification Verified: Yes activities of daily living that may affect Secondary Verification Process Completed: Yes risk of falls: Patient Requires Transmission-Based Precautions: No Signs or symptoms of abuse/neglect since last visito No Patient Has Alerts: No Hospitalized since last visit: No Implantable device outside of the clinic excluding No cellular tissue based products placed in the center since last visit: Has Dressing in Place as Prescribed: Yes Has Compression in Place as Prescribed: Yes Pain Present Now: Yes Electronic Signature(s) Signed: 09/03/2022 4:10:18 PM By: [name] Entered By: [name] on 09/03/2022 13:56:51 -------------------------------------------------------------------------------- Clinic Level of Care Assessment Details Patient Name: Date of Service: [name] 09/03/2022 1:45 PM Medical Record Number: [id number] Patient Account Number: [id number] Date of Birth/Sex: Treating RN: 10-30-1955 (67 y.o. [name] Primary Care [name]: [name] Other Clinician: Massie [name] Referring [name]: Treating [name]: [name] in Treatment: 7 Clinic Level of Care Assessment Items TOOL 1 Quantity Score []  - 0 Use when EandM and Procedure is performed on INITIAL visit ASSESSMENTS - Nursing Assessment / Reassessment []  - 0 General Physical Exam (combine w/ comprehensive assessment (listed just below) when performed on new pt. [id number] [name], [name] ([id number]) [id number] Page 2 of 10 []  - 0 Comprehensive Assessment (HX, ROS, Risk Assessments, Wounds Hx, etc.) ASSESSMENTS - Wound and Skin Assessment / Reassessment []  - 0 Dermatologic / Skin Assessment (not related to wound area) ASSESSMENTS - Ostomy and/or Continence Assessment and Care []  - 0 Incontinence Assessment and Management []  - 0 Ostomy Care Assessment and Management (repouching, etc.) PROCESS - Coordination of Care []  - 0 Simple Patient / Family Education for ongoing care []  - 0 Complex (extensive) Patient / Family Education for ongoing care []  - 0 Staff obtains Programmer, systems, Records, T Results / Process Orders est []  - 0 Staff telephones HHA, Nursing Homes / Clarify orders / etc []  - 0 Routine Transfer to another Facility (non-emergent condition) []  - 0 Routine Hospital Admission (non-emergent condition) []  - 0 New Admissions / Biomedical engineer / Ordering NPWT Apligraf, etc. , []  - 0 Emergency Hospital Admission (emergent condition) PROCESS - Special Needs []  - 0 Pediatric / Minor Patient Management []  - 0 Isolation Patient Management []  - 0 Hearing / Language / Visual special needs []  - 0 Assessment of Community assistance (transportation, D/C planning, etc.) []  - 0 Additional assistance / Altered mentation []  - 0 Support Surface(s) Assessment (bed, cushion, seat, etc.) INTERVENTIONS - Miscellaneous []  - 0 External ear exam []  - 0 Patient Transfer (multiple staff / Civil Service fast streamer / Similar devices) []  - 0 Simple Staple / Suture removal (25 or less) []  - 0 Complex  Staple / Suture removal (26 or more) []  - 0 Hypo/Hyperglycemic Management (do not check if billed separately) []  - 0 Ankle / Brachial Index (ABI) - do  not check if billed separately Has the patient been seen at the hospital within the last three years: Yes Total Score: 0 Level Of Care: ____ Electronic Signature(s) Signed: 09/03/2022 4:10:18 PM By: [name] Entered By: [name] on 09/03/2022 14:42:22 -------------------------------------------------------------------------------- Encounter Discharge Information Details Patient Name: Date of Service: Matthew Broom [name] 09/03/2022 1:45 PM Medical Record Number: [id number] Patient Account Number: [id number] Date of Birth/Sex: Treating RN: August 28, 1955 (67 y.o. [name] Primary Care Zaara Sprowl: [name] Other Clinician: [name], [name] ([id number]) [id number] Page 3 of 10 Referring Jordyn Doane: Treating Saudia Smyser/Extender: [name] in Treatment: 7 Encounter Discharge Information Items Post Procedure Vitals Discharge Condition: Stable Temperature (F): 97.7 Ambulatory Status: Ambulatory Pulse (bpm): 69 Discharge Destination: Home Respiratory Rate (breaths/min): 18 Transportation: Private Auto Blood Pressure (mmHg): 116/76 Accompanied By: girlfriend Schedule Follow-up Appointment: Yes Clinical Summary of Care: Electronic Signature(s) Signed: 09/03/2022 4:10:18 PM By: [name] Entered By: [name] on 09/03/2022 15:03:37 -------------------------------------------------------------------------------- Lower Extremity Assessment Details Patient Name: Date of Service: [name] 09/03/2022 1:45 PM Medical Record Number: [id number] Patient Account Number: [id number] Date of Birth/Sex: Treating RN: Feb 19, 1956 (67 y.o. [name] Primary Care Landi Biscardi: [name] Other Clinician: Massie [name] Referring Alexey Rhoads: Treating  Taelor Moncada/Extender: [name] in Treatment: 7 Edema Assessment Assessed: [name]: Yes] [Right: Yes] Edema: [Left: Yes] [Right: Yes] Calf Left: Right: Point of Measurement: 35 cm From Medial Instep 57 cm 63.5 cm Ankle Left: Right: Point of Measurement: 10 cm From Medial Instep 32.5 cm 33 cm Vascular Assessment Pulses: Dorsalis Pedis Palpable: [Left:Yes] [Right:Yes] Electronic Signature(s) Signed: 09/03/2022 4:10:18 PM By: [name] Signed: 09/03/2022 4:35:37 PM By: [name], [name], RN, [name], [name], [name] Entered By: [name] on 09/03/2022 14:22:09 [name] ([id number]) [phone].pdf Page 4 of 10 -------------------------------------------------------------------------------- Multi Wound Chart Details Patient Name: Date of Service: [name] 09/03/2022 1:45 PM Medical Record Number: [id number] Patient Account Number: [id number] Date of Birth/Sex: Treating RN: 02-06-1956 (67 y.o. [name], [name] Primary Care Trashaun Streight: [name] Other Clinician: Massie [name] Referring Perle Brickhouse: Treating Theodosia Bahena/Extender: [name] in Treatment: 7 Vital Signs Height(in): 73 Pulse(bpm): 72 Weight(lbs): [id number] Blood Pressure(mmHg): 116/76 Body Mass Index(BMI): 47.2 Temperature(F): 97.7 Respiratory Rate(breaths/min): 18 [1:Photos:] [N/A:N/A] Right, Anterior Lower Leg Left, Circumferential Lower Leg N/A Wound Location: Gradually Appeared Gradually Appeared N/A Wounding Event: Diabetic Wound/Ulcer of the Lower Diabetic Wound/Ulcer of the Lower N/A Primary Etiology: Extremity Extremity Arrhythmia, Congestive Heart Failure, Arrhythmia, Congestive Heart Failure, N/A Comorbid History: Hypertension, Type II Diabetes Hypertension, Type II Diabetes 06/15/2022 06/15/2022 N/A Date Acquired: 7 7 N/A Weeks of Treatment: Open Open N/A Wound Status: No No N/A Wound Recurrence: 1x1.1x0.1 8.5x25.4x0.3  N/A Measurements L x W x D (cm) 0.864 169.567 N/A A (cm) : rea 0.086 50.87 N/A Volume (cm) : 98.50% 71.80% N/A % Reduction in A rea: 98.50% 15.30% N/A % Reduction in Volume: Grade 1 Grade 1 N/A Classification: Medium Medium N/A Exudate A mount: Serosanguineous Serosanguineous N/A Exudate Type: red, brown red, brown N/A Exudate Color: Medium (34-66%) Medium (34-66%) N/A Granulation A mount: Red Red N/A Granulation Quality: Medium (34-66%) Medium (34-66%) N/A Necrotic A mount: Fat Layer (Subcutaneous Tissue): Yes Fat Layer (Subcutaneous Tissue): Yes N/A Exposed Structures: Fascia: No Fascia: No Tendon: No Tendon: No Muscle: No Muscle: No Joint: No Joint: No Bone: No Bone: No None None N/A EpithelializationZAVION, [name] ([id number]) [phone].pdf Page 5 of 10 Treatment Notes  Electronic Signature(s) Signed: 09/03/2022 4:10:18 PM By: [name] Entered By: [name] on 09/03/2022 14:22:13 -------------------------------------------------------------------------------- Multi-Disciplinary Care Plan Details Patient Name: Date of Service: Matthew Broom [name] 09/03/2022 1:45 PM Medical Record Number: [id number] Patient Account Number: [id number] Date of Birth/Sex: Treating RN: 1956-05-02 (67 y.o. [name], [name] Primary Care Zeriah Baysinger: [name] Other Clinician: Massie [name] Referring Xylina Rhoads: Treating Aylani Spurlock/Extender: [name] in Treatment: 7 Active Inactive Necrotic Tissue Nursing Diagnoses: Knowledge deficit related to management of necrotic/devitalized tissue Goals: Necrotic/devitalized tissue will be minimized in the wound bed Date Initiated: 07/16/2022 Target Resolution Date: 08/14/2022 Goal Status: Active Patient/caregiver will verbalize understanding of reason and process for debridement of necrotic tissue Date Initiated: 07/16/2022 Target Resolution Date: 08/14/2022 Goal Status:  Active Interventions: Assess patient pain level pre-, during and post procedure and prior to discharge Provide education on necrotic tissue and debridement process Notes: Wound/Skin Impairment Nursing Diagnoses: Knowledge deficit related to ulceration/compromised skin integrity Goals: Patient/caregiver will verbalize understanding of skin care regimen Date Initiated: 07/16/2022 Target Resolution Date: 08/14/2022 Goal Status: Active Ulcer/skin breakdown will have a volume reduction of 30% by week 4 Date Initiated: 07/16/2022 Target Resolution Date: 08/14/2022 Goal Status: Active Ulcer/skin breakdown will have a volume reduction of 50% by week 8 Date Initiated: 07/16/2022 Target Resolution Date: 09/14/2022 Goal Status: Active Ulcer/skin breakdown will have a volume reduction of 80% by week 12 Date Initiated: 07/16/2022 Target Resolution Date: 10/14/2022 Goal Status: Active Ulcer/skin breakdown will heal within 14 weeks Date Initiated: 07/16/2022 Target Resolution Date: 11/14/2022 Goal Status: Active Interventions: Assess patient/caregiver ability to obtain necessary supplies [name], [name] ([id number]) [id number].pdf Page 6 of 10 Assess patient/caregiver ability to perform ulcer/skin care regimen upon admission and as needed Assess ulceration(s) every visit Notes: Electronic Signature(s) Signed: 09/03/2022 4:10:18 PM By: [name] Signed: 09/03/2022 4:35:37 PM By: [name], [name], RN, [name], [name], [name] Entered By: [name] on 09/03/2022 15:02:04 -------------------------------------------------------------------------------- Pain Assessment Details Patient Name: Date of Service: Matthew Broom [name] 09/03/2022 1:45 PM Medical Record Number: [id number] Patient Account Number: [id number] Date of Birth/Sex: Treating RN: 1956/05/10 (67 y.o. [name] Primary Care Sherria Riemann: [name] Other Clinician: Massie [name] Referring Mannie Ohlin: Treating Bracken Moffa/Extender:  [name] in Treatment: 7 Active Problems Location of Pain Severity and Description of Pain Patient Has Paino Yes Site Locations Pain Location: Pain in Ulcers Duration of the Pain. Constant / Intermittento Intermittent Rate the pain. Current Pain Level: 3 Character of Pain Describe the Pain: Dull Pain Management and Medication Current Pain Management: Medication: No Cold Application: No Rest: No Massage: No Activity: No T.E.N.S.: No Heat Application: No Leg drop or elevation: No Is the Current Pain Management Adequate: Inadequate How does your wound impact your activities of daily livingo Sleep: No Bathing: No Appetite: No Relationship With Others: No Bladder Continence: No Emotions: No Bowel Continence: No Work: No Toileting: No Drive: No Dressing: No Hobbies: No [name], [name] ([id number]) [id number] Page 7 of 10 Electronic Signature(s) Signed: 09/03/2022 4:10:18 PM By: [name] Signed: 09/03/2022 4:35:37 PM By: [name], [name], RN, [name], [name], [name] Entered By: [name] on 09/03/2022 14:05:05 -------------------------------------------------------------------------------- Patient/Caregiver Education Details Patient Name: Date of Service: [name] 3/21/2024andnbsp1:45 PM Medical Record Number: [id number] Patient Account Number: [id number] Date of Birth/Gender: Treating RN: 09/20/1955 (67 y.o. [name] Primary Care Physician: [name] Other Clinician: Massie [name] Referring Physician: Treating Physician/Extender: [name] in Treatment: 7 Education Assessment  Education Provided To: Patient Education Topics Provided Wound/Skin Impairment: Handouts: Other: continue wound care as directed Methods: Explain/Verbal Responses: State content correctly Electronic Signature(s) Signed: 09/03/2022 4:10:18 PM By: [name] Entered By: [name] on  09/03/2022 15:02:25 -------------------------------------------------------------------------------- Wound Assessment Details Patient Name: Date of Service: Otto Herb J. 09/03/2022 1:45 PM Medical Record Number: [id number] Patient Account Number: [id number] Date of Birth/Sex: Treating RN: 05/21/1956 (67 y.o. [name] Primary Care Keilyn Nadal: [name] Other Clinician: Massie [name] Referring Kaidin Boehle: Treating Naila Elizondo/Extender: [name] in Treatment: 7 Wound Status Wound Number: 1 Primary Diabetic Wound/Ulcer of the Lower Extremity Etiology: Wound Location: Right, Anterior Lower Leg Wound Status: Open Wounding Event: Gradually Appeared Comorbid Arrhythmia, Congestive Heart Failure, Hypertension, Type II Date Acquired: 06/15/2022 History: Diabetes Weeks Of Treatment: 7 [name], [name] ([id number]) [id number] Page 8 of 10 Clustered Wound: No Photos Wound Measurements Length: (cm) 1 Width: (cm) 1.1 Depth: (cm) 0.1 Area: (cm) 0.864 Volume: (cm) 0.086 % Reduction in Area: 98.5% % Reduction in Volume: 98.5% Epithelialization: None Wound Description Classification: Grade 1 Exudate Amount: Medium Exudate Type: Serosanguineous Exudate Color: red, brown Foul Odor After Cleansing: No Slough/Fibrino Yes Wound Bed Granulation Amount: Medium (34-66%) Exposed Structure Granulation Quality: Red Fascia Exposed: No Necrotic Amount: Medium (34-66%) Fat Layer (Subcutaneous Tissue) Exposed: Yes Tendon Exposed: No Muscle Exposed: No Joint Exposed: No Bone Exposed: No Treatment Notes Wound #1 (Lower Leg) Wound Laterality: Right, Anterior Cleanser Soap and Water Discharge Instruction: Gently cleanse wound with antibacterial soap, rinse and pat dry prior to dressing wounds Peri-Wound Care Topical Keystone gel Primary Dressing AquacelAg Advantage Dressing, 4x5 (in/in) Discharge Instruction: Apply to wound as  directed Secondary Dressing ABD Pad 5x9 (in/in) Discharge Instruction: Cover with ABD pad Secured With Tubigrip Size G, 4.5x10 (in/yd) Discharge Instruction: Apply Tubigrip G 3-finger-widths below knee to base of toes to secure dressing and/or for swelling. Compression Wrap Compression Stockings Add-Ons Electronic Signature(s) Signed: 09/03/2022 4:10:18 PM By: [name] Signed: 09/03/2022 4:35:37 PM By: [name], [name], RN, [name], [name], [name] Entered By: [name] on 09/03/2022 14:19:13 [name] ([id number]) [id number] Page 9 of 10 -------------------------------------------------------------------------------- Wound Assessment Details Patient Name: Date of Service: [name] 09/03/2022 1:45 PM Medical Record Number: [id number] Patient Account Number: [id number] Date of Birth/Sex: Treating RN: October 07, 1955 (67 y.o. [name], [name] Primary Care Jalene Lacko: [name] Other Clinician: Massie [name] Referring Dlisa Barnwell: Treating Reinaldo Helt/Extender: [name] in Treatment: 7 Wound Status Wound Number: 2 Primary Diabetic Wound/Ulcer of the Lower Extremity Etiology: Wound Location: Left, Circumferential Lower Leg Wound Status: Open Wounding Event: Gradually Appeared Comorbid Arrhythmia, Congestive Heart Failure, Hypertension, Type II Date Acquired: 06/15/2022 History: Diabetes Weeks Of Treatment: 7 Clustered Wound: No Photos Wound Measurements Length: (cm) 8.5 Width: (cm) 25.4 Depth: (cm) 0.3 Area: (cm) 169.567 Volume: (cm) 50.87 % Reduction in Area: 71.8% % Reduction in Volume: 15.3% Epithelialization: None Wound Description Classification: Grade 1 Exudate Amount: Medium Exudate Type: Serosanguineous Exudate Color: red, brown Foul Odor After Cleansing: No Slough/Fibrino Yes Wound Bed Granulation Amount: Medium (34-66%) Exposed Structure Granulation Quality: Red Fascia Exposed: No Necrotic Amount:  Medium (34-66%) Fat Layer (Subcutaneous Tissue) Exposed: Yes Tendon Exposed: No Muscle Exposed: No Joint Exposed: No Bone Exposed: No Treatment Notes Wound #2 (Lower Leg) Wound Laterality: Left, Circumferential Cleanser Soap and Water Discharge Instruction: Gently cleanse wound with antibacterial soap, rinse and pat dry prior to dressing wounds AAMIR, MCGRUE ([id number]) [phone].pdf Page 10 of 10 Peri-Wound  Care Topical Keystone gel Primary Dressing AquacelAg Advantage Dressing, 4x5 (in/in) Discharge Instruction: Apply to wound as directed Secondary Dressing ABD Pad 5x9 (in/in) Discharge Instruction: Cover with ABD pad Secured With Tubigrip Size G, 4.5x10 (in/yd) Discharge Instruction: Apply Tubigrip G 3-finger-widths below knee to base of toes to secure dressing and/or for swelling. Compression Wrap Compression Stockings Add-Ons Electronic Signature(s) Signed: 09/03/2022 4:10:18 PM By: [name] Signed: 09/03/2022 4:35:37 PM By: [name], [name], RN, [name], [name], [name] Entered By: [name] on 09/03/2022 14:20:05 -------------------------------------------------------------------------------- Vitals Details Patient Name: Date of Service: Matthew Broom [name] 09/03/2022 1:45 PM Medical Record Number: [id number] Patient Account Number: [id number] Date of Birth/Sex: Treating RN: [birth year] (67 y.o. [name], [name] Primary Care Destan Franchini: [name] Other Clinician: Massie [name] Referring Elzena Muston: Treating Lael Wetherbee/Extender: [name] in Treatment: 7 Vital Signs Time Taken: 13:57 Temperature (F): 97.7 Height (in): 73 Pulse (bpm): 69 Weight (lbs): 358 Respiratory Rate (breaths/min): 18 Body Mass Index (BMI): 47.2 Blood Pressure (mmHg): 116/76 Reference Range: 80 - 120 mg / dl Electronic Signature(s) Signed: 09/03/2022 4:10:18 PM By: [name] Entered By: [name] on 09/03/2022 14:05:01

## 2022-09-10 ENCOUNTER — Encounter: Payer: PPO | Admitting: Physician Assistant

## 2022-09-10 DIAGNOSIS — E11622 Type 2 diabetes mellitus with other skin ulcer: Secondary | ICD-10-CM | POA: Diagnosis not present

## 2022-09-10 DIAGNOSIS — L97822 Non-pressure chronic ulcer of other part of left lower leg with fat layer exposed: Secondary | ICD-10-CM | POA: Diagnosis not present

## 2022-09-10 NOTE — Progress Notes (Addendum)
OSMIN, AKRIDGE ([id number]) [id number] Page 1 of 7 Visit Report for 09/10/2022 Chief Complaint Document Details Patient Name: Date of Service: [name] 09/10/2022 1:30 PM Medical Record Number: [id number] Patient Account Number: [id number] Date of Birth/Sex: Treating RN: 1956-03-28 (67 y.o. [name]) [name] Primary Care Provider: [name] Other Clinician: Referring Provider: Treating Provider/Extender: [name] in Treatment: 8 Information Obtained from: Patient Chief Complaint Bilateral LE ulcers and lymphedema Electronic Signature(s) Signed: 09/10/2022 1:40:02 PM By: Worthy Keeler PA-C Entered By: Worthy Keeler on 09/10/2022 13:40:02 -------------------------------------------------------------------------------- Debridement Details Patient Name: Date of Service: Matthew Broom [name] 09/10/2022 1:30 PM Medical Record Number: [id number] Patient Account Number: [id number] Date of Birth/Sex: Treating RN: [birth year] (67 y.o. [name]) [name] Primary Care Provider: [name] Other Clinician: Referring Provider: Treating Provider/Extender: [name] in Treatment: 8 Debridement Performed for Assessment: Wound #2 Left,Circumferential Lower Leg Performed By: Physician Tommie Sams., PA-C Debridement Type: Debridement Severity of Tissue Pre Debridement: Limited to breakdown of skin Level of Consciousness (Pre-procedure): Awake and Alert Pre-procedure Verification/Time Out Yes - 13:55 Taken: Start Time: 13:55 T Area Debrided (L x W): otal 5 (cm) x 5 (cm) = 25 (cm) Tissue and other material debrided: Viable, Non-Viable, Slough, Subcutaneous, Skin: Dermis , Skin: Epidermis, Slough Level: Skin/Subcutaneous Tissue Debridement Description: Excisional Instrument: Curette Bleeding: Minimum Hemostasis Achieved: Pressure End Time: 14:04 Procedural Pain: 0 Post Procedural Pain: 0 Response to  Treatment: Procedure was tolerated well Level of Consciousness (Post- Awake and Alert procedure): Post Debridement Measurements of Total Wound Length: (cm) 8.5 Width: (cm) 26 Depth: (cm) 0.3 Volume: (cm) 52.072 Character of Wound/Ulcer Post Debridement: Improved Severity of Tissue Post Debridement: Fat layer exposed Post Procedure Diagnosis Same as Pre-procedure Electronic Signature(s) Signed: 09/10/2022 4:02:24 PM By: Worthy Keeler PA-C Signed: 09/11/2022 1:09:18 PM By: [name] RN Entered By: [name] on 09/10/2022 14:01:49 [name] ([id number]) [id number] Page 2 of 7 -------------------------------------------------------------------------------- HPI Details Patient Name: Date of Service: [name] 09/10/2022 1:30 PM Medical Record Number: [id number] Patient Account Number: [id number] Date of Birth/Sex: Treating RN: [birth year] (67 y.o. [name]) [name] Primary Care Provider: [name] Other Clinician: Referring Provider: Treating Provider/Extender: [name] in Treatment: 8 History of Present Illness HPI Description: 07-16-2022 upon evaluation today patient appears to be doing poorly currently in regard to his his legs. The left leg is actually worse than the right. This is a patient that is previously been seen in the [place] office and at that point was doing really quite well with compression wrapping and often alginate are either Hydrofera Blue dressings. Fortunately he has gone since November 2020 until just current with out having any issue or need for wound care services which is great news. Unfortunately he is here today because he is having some issues at this point. Patient does have a history of several medical conditions which include diabetes mellitus type 2, lymphedema, hypertension, congestive heart failure, and cardiac arrhythmia though is not sure that this is actually atrial  fibrillation based on what he has been told. He does have lymphedema pumps but tells me he has not used them since the summer when he had to move to a remodel of his building and subsequently never unpacked them. 07-23-2022 upon evaluation today patient appears to be doing well with regard to his legs. He is going require some sharp debridement today but overall seems to be making  some progress. I am pleased in that regard he has much less drainage that he had did last week I think the compression wraps are definitely helping. 07-30-2022 upon evaluation today patient appears to be doing poorly in regard to his legs he actually has blue-green drainage which I think is consistent with Pseudomonas. I believe that we need to address this as soon as possible and I discussed that with the patient today as well. Fortunately I do not see any signs of active infection locally nor systemically at this time which is great news. No fevers, chills, nausea, vomiting, or diarrhea. 08-13-2022 upon evaluation today patient appears to be doing well currently in regard to his leg ulcers which are actually looking much better. Fortunately there does not appear to be any signs of active infection at this time which is great news. No fevers, chills, nausea, vomiting, or diarrhea. 3/7; patient with predominantly a left medial lower extremity wound as well as several smaller areas and a small area on the right lateral. His [employer] came in today. We are using silver alginate as the primary dressing 09-03-2022 upon evaluation today patient appears to be doing well currently in regard to his wounds. He has been tolerating the dressing changes without complication. Fortunately there does not appear to be any signs of infection there is needed however for sharp debridement. 09-10-2022 upon evaluation today patient appears to be doing well currently in regard to his wound. Has been tolerating the dressing changes without complication.  Fortunately there does not appear to be any signs of infection looking systemically which is great news and overall I am extremely pleased with where we stand today. I do not see any signs of infection. Electronic Signature(s) Signed: 09/10/2022 2:05:33 PM By: Worthy Keeler PA-C Entered By: Worthy Keeler on 09/10/2022 14:05:33 -------------------------------------------------------------------------------- Physical Exam Details Patient Name: Date of Service: [name] 09/10/2022 1:30 PM Medical Record Number: [id number] Patient Account Number: [id number] Date of Birth/Sex: Treating RN: 17-Jun-1955 (66 y.o. Oval Linsey Primary Care Provider: [name] Other Clinician: Referring Provider: Treating Provider/Extender: [name] in Treatment: 8 Constitutional Well-nourished and well-hydrated in no acute distress. Respiratory normal breathing without difficulty. Psychiatric this patient is able to make decisions and demonstrates good insight into disease process. Alert and Oriented x 3. pleasant and cooperative. Notes I would recommend currently based on what we are seeing we are going to perform debridement clearway some of the necrotic debris he tolerated that today without complication postdebridement the wound bed is significantly improved and overall I think that we are headed in the right direction. Electronic Signature(s) Signed: 09/10/2022 2:05:46 PM By: Worthy Keeler PA-C Entered By: Worthy Keeler on 09/10/2022 14:05:46 [name] ([id number]) [id number] Page 3 of 7 -------------------------------------------------------------------------------- Physician Orders Details Patient Name: Date of Service: [name] 09/10/2022 1:30 PM Medical Record Number: [id number] Patient Account Number: [id number] Date of Birth/Sex: Treating RN: May 11, 1956 (67 y.o. [name]) [name] Primary Care Provider: [name] Other Clinician: Referring Provider: Treating Provider/Extender: [name] in Treatment: 8 Verbal / Phone Orders: No Diagnosis Coding ICD-10 Coding Code Description E11.622 Type 2 diabetes mellitus with other skin ulcer I89.0 Lymphedema, not elsewhere classified L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 L97.812 Non-pressure chronic ulcer of other part of right lower leg with fat layer exposed I10 Essential (primary) hypertension I50.42 Chronic combined systolic (congestive)  and diastolic (congestive) heart failure I49.9 Cardiac arrhythmia, unspecified Follow-up Appointments Return Appointment in 1 week. Nurse Visit as needed Bathing/ Shower/ Hygiene May shower with wound dressing protected with water repellent cover or cast protector. Anesthetic (Use 'Patient Medications' Section for Anesthetic Order Entry) Lidocaine applied to wound bed Edema Control - Lymphedema / Segmental Compressive Device / Other Elevate, Exercise Daily and A void Standing for Long Periods of Time. Elevate legs to the level of the heart and pump ankles as often as possible Elevate leg(s) parallel to the floor when sitting. Compression Pump: Use compression pump on left lower extremity for 60 minutes, twice daily. Compression Pump: Use compression pump on right lower extremity for 60 minutes, twice daily. DO YOUR BEST to sleep in the bed at night. DO NOT sleep in your recliner. Long hours of sitting in a recliner leads to swelling of the legs and/or potential wounds on your backside. Wound Treatment Wound #1 - Lower Leg Wound Laterality: Right, Anterior Cleanser: Soap and Water 3 x Per Week/30 Days Discharge Instructions: Gently cleanse wound with antibacterial soap, rinse and pat dry prior to dressing wounds Topical: Keystone gel 3 x Per Week/30 Days Prim Dressing: AquacelAg Advantage Dressing, 4x5 (in/in) 3 x Per Week/30  Days ary Discharge Instructions: Apply to wound as directed Secondary Dressing: ABD Pad 5x9 (in/in) 3 x Per Week/30 Days Discharge Instructions: Cover with ABD pad Secured With: Tubigrip Size G, 4.5x10 (in/yd) 3 x Per Week/30 Days Discharge Instructions: single layer Wound #2 - Lower Leg Wound Laterality: Left, Circumferential Cleanser: Soap and Water 3 x Per Week/30 Days Discharge Instructions: Gently cleanse wound with antibacterial soap, rinse and pat dry prior to dressing wounds Topical: Keystone gel 3 x Per Week/30 Days Prim Dressing: AquacelAg Advantage Dressing, 4x5 (in/in) (Dispense As Written) 3 x Per Week/30 Days ary Discharge Instructions: Apply to wound as directed Secondary Dressing: ABD Pad 5x9 (in/in) 3 x Per Week/30 Days Discharge Instructions: Cover with ABD pad Secured With: Tubigrip Size G, 4.5x10 (in/yd) 3 x Per Week/30 Days Discharge Instructions: single layer [name], [name] ([id number]) [id number] Page 4 of 7 Electronic Signature(s) Signed: 09/10/2022 4:02:24 PM By: Worthy Keeler PA-C Signed: 09/11/2022 1:09:18 PM By: [name] RN Entered By: [name] on 09/10/2022 13:59:01 -------------------------------------------------------------------------------- Problem List Details Patient Name: Date of Service: Matthew Broom [name] 09/10/2022 1:30 PM Medical Record Number: [id number] Patient Account Number: [id number] Date of Birth/Sex: Treating RN: 1955-10-26 (67 y.o. [name]) [name] Primary Care Provider: [name] Other Clinician: Referring Provider: Treating Provider/Extender: [name] in Treatment: 8 Active Problems ICD-10 Encounter Code Description Active Date MDM Diagnosis E11.622 Type 2 diabetes mellitus with other skin ulcer 07/16/2022 No Yes I89.0 Lymphedema, not elsewhere classified 07/16/2022 No Yes L97.822 Non-pressure chronic ulcer of other part of left lower leg with fat layer  exposed2/06/2022 No Yes L97.812 Non-pressure chronic ulcer of other part of right lower leg with fat layer 07/16/2022 No Yes exposed I10 Essential (primary) hypertension 07/16/2022 No Yes I50.42 Chronic combined systolic (congestive) and diastolic (congestive) heart failure 07/16/2022 No Yes I49.9 Cardiac arrhythmia, unspecified 07/16/2022 No Yes Inactive Problems Resolved Problems Electronic Signature(s) Signed: 09/10/2022 1:39:58 PM By: Worthy Keeler PA-C Entered By: Worthy Keeler on 09/10/2022 13:39:58 -------------------------------------------------------------------------------- Progress Note Details Patient Name: Date of Service: Matthew Broom [name] 09/10/2022 1:30 PM Medical Record Number: [id number] Patient Account Number: [id number] Date of Birth/Sex: Treating RN: Dec 18, 1955 (67 y.o. [name]) [name] Primary Care Provider: Delfina Redwood,  Jori Moll Other Clinician: Referring Provider: Treating Provider/Extender: [name] in Treatment: [address] [name], [name] ([id number]) [id number] Page 5 of 7 Subjective Chief Complaint Information obtained from Patient Bilateral LE ulcers and lymphedema History of Present Illness (HPI) 07-16-2022 upon evaluation today patient appears to be doing poorly currently in regard to his his legs. The left leg is actually worse than the right. This is a patient that is previously been seen in the [place] office and at that point was doing really quite well with compression wrapping and often alginate are either Hydrofera Blue dressings. Fortunately he has gone since November 2020 until just current with out having any issue or need for wound care services which is great news. Unfortunately he is here today because he is having some issues at this point. Patient does have a history of several medical conditions which include diabetes mellitus type 2, lymphedema, hypertension, congestive heart failure, and cardiac  arrhythmia though is not sure that this is actually atrial fibrillation based on what he has been told. He does have lymphedema pumps but tells me he has not used them since the summer when he had to move to a remodel of his building and subsequently never unpacked them. 07-23-2022 upon evaluation today patient appears to be doing well with regard to his legs. He is going require some sharp debridement today but overall seems to be making some progress. I am pleased in that regard he has much less drainage that he had did last week I think the compression wraps are definitely helping. 07-30-2022 upon evaluation today patient appears to be doing poorly in regard to his legs he actually has blue-green drainage which I think is consistent with Pseudomonas. I believe that we need to address this as soon as possible and I discussed that with the patient today as well. Fortunately I do not see any signs of active infection locally nor systemically at this time which is great news. No fevers, chills, nausea, vomiting, or diarrhea. 08-13-2022 upon evaluation today patient appears to be doing well currently in regard to his leg ulcers which are actually looking much better. Fortunately there does not appear to be any signs of active infection at this time which is great news. No fevers, chills, nausea, vomiting, or diarrhea. 3/7; patient with predominantly a left medial lower extremity wound as well as several smaller areas and a small area on the right lateral. His [employer] came in today. We are using silver alginate as the primary dressing 09-03-2022 upon evaluation today patient appears to be doing well currently in regard to his wounds. He has been tolerating the dressing changes without complication. Fortunately there does not appear to be any signs of infection there is needed however for sharp debridement. 09-10-2022 upon evaluation today patient appears to be doing well currently in regard to his wound. Has  been tolerating the dressing changes without complication. Fortunately there does not appear to be any signs of infection looking systemically which is great news and overall I am extremely pleased with where we stand today. I do not see any signs of infection. Objective Constitutional Well-nourished and well-hydrated in no acute distress. Vitals Time Taken: 1:32 PM, Height: 73 in, Weight: 358 lbs, BMI: 47.2, Temperature: 98.2 F, Pulse: 80 bpm, Respiratory Rate: 18 breaths/min, Blood Pressure: 126/79 mmHg. Respiratory normal breathing without difficulty. Psychiatric this patient is able to make decisions and demonstrates good insight into disease process. Alert and Oriented x 3. pleasant and cooperative. General  Notes: I would recommend currently based on what we are seeing we are going to perform debridement clearway some of the necrotic debris he tolerated that today without complication postdebridement the wound bed is significantly improved and overall I think that we are headed in the right direction. Integumentary (Hair, Skin) Wound #1 status is Open. Original cause of wound was Gradually Appeared. The date acquired was: 06/15/2022. The wound has been in treatment 8 weeks. The wound is located on the Right,Anterior Lower Leg. The wound measures 0.5cm length x 0.5cm width x 0.1cm depth; 0.196cm^2 area and 0.02cm^3 volume. There is Fat Layer (Subcutaneous Tissue) exposed. There is no tunneling or undermining noted. There is a medium amount of serosanguineous drainage noted. There is medium (34-66%) red granulation within the wound bed. There is a medium (34-66%) amount of necrotic tissue within the wound bed. Wound #2 status is Open. Original cause of wound was Gradually Appeared. The date acquired was: 06/15/2022. The wound has been in treatment 8 weeks. The wound is located on the Left,Circumferential Lower Leg. The wound measures 8.5cm length x 26cm width x 0.3cm depth; 173.573cm^2 area and  52.072cm^3 volume. There is Fat Layer (Subcutaneous Tissue) exposed. There is no tunneling or undermining noted. There is a medium amount of serosanguineous drainage noted. There is medium (34-66%) red granulation within the wound bed. There is a medium (34-66%) amount of necrotic tissue within the wound bed. Assessment Active Problems ICD-10 Type 2 diabetes mellitus with other skin ulcer Lymphedema, not elsewhere classified Non-pressure chronic ulcer of other part of left lower leg with fat layer exposed Non-pressure chronic ulcer of other part of right lower leg with fat layer exposed [name], [name] ([id number]) [id number] Page 6 of 7 Essential (primary) hypertension Chronic combined systolic (congestive) and diastolic (congestive) heart failure Cardiac arrhythmia, unspecified Procedures Wound #2 Pre-procedure diagnosis of Wound #2 is a Diabetic Wound/Ulcer of the Lower Extremity located on the Left,Circumferential Lower Leg .Severity of Tissue Pre Debridement is: Limited to breakdown of skin. There was a Excisional Skin/Subcutaneous Tissue Debridement with a total area of 25 sq cm performed by Tommie Sams., PA-C. With the following instrument(s): Curette to remove Viable and Non-Viable tissue/material. Material removed includes Subcutaneous Tissue, Slough, Skin: Dermis, and Skin: Epidermis. No specimens were taken. A time out was conducted at 13:55, prior to the start of the procedure. A Minimum amount of bleeding was controlled with Pressure. The procedure was tolerated well with a pain level of 0 throughout and a pain level of 0 following the procedure. Post Debridement Measurements: 8.5cm length x 26cm width x 0.3cm depth; 52.072cm^3 volume. Character of Wound/Ulcer Post Debridement is improved. Severity of Tissue Post Debridement is: Fat layer exposed. Post procedure Diagnosis Wound #2: Same as Pre-Procedure Plan Follow-up Appointments: Return  Appointment in 1 week. Nurse Visit as needed Bathing/ Shower/ Hygiene: May shower with wound dressing protected with water repellent cover or cast protector. Anesthetic (Use 'Patient Medications' Section for Anesthetic Order Entry): Lidocaine applied to wound bed Edema Control - Lymphedema / Segmental Compressive Device / Other: Elevate, Exercise Daily and Avoid Standing for Long Periods of Time. Elevate legs to the level of the heart and pump ankles as often as possible Elevate leg(s) parallel to the floor when sitting. Compression Pump: Use compression pump on left lower extremity for 60 minutes, twice daily. Compression Pump: Use compression pump on right lower extremity for 60 minutes, twice daily. DO YOUR BEST to sleep in the bed at night. DO NOT  sleep in your recliner. Long hours of sitting in a recliner leads to swelling of the legs and/or potential wounds on your backside. WOUND #1: - Lower Leg Wound Laterality: Right, Anterior Cleanser: Soap and Water 3 x Per Week/30 Days Discharge Instructions: Gently cleanse wound with antibacterial soap, rinse and pat dry prior to dressing wounds Topical: Keystone gel 3 x Per Week/30 Days Prim Dressing: AquacelAg Advantage Dressing, 4x5 (in/in) 3 x Per Week/30 Days ary Discharge Instructions: Apply to wound as directed Secondary Dressing: ABD Pad 5x9 (in/in) 3 x Per Week/30 Days Discharge Instructions: Cover with ABD pad Secured With: Tubigrip Size G, 4.5x10 (in/yd) 3 x Per Week/30 Days Discharge Instructions: single layer WOUND #2: - Lower Leg Wound Laterality: Left, Circumferential Cleanser: Soap and Water 3 x Per Week/30 Days Discharge Instructions: Gently cleanse wound with antibacterial soap, rinse and pat dry prior to dressing wounds Topical: Keystone gel 3 x Per Week/30 Days Prim Dressing: AquacelAg Advantage Dressing, 4x5 (in/in) (Dispense As Written) 3 x Per Week/30 Days ary Discharge Instructions: Apply to wound as  directed Secondary Dressing: ABD Pad 5x9 (in/in) 3 x Per Week/30 Days Discharge Instructions: Cover with ABD pad Secured With: Tubigrip Size G, 4.5x10 (in/yd) 3 x Per Week/30 Days Discharge Instructions: single layer 1. I would recommend that we have the patient continue to utilize the [facility] topical antibiotics which I think are really doing a great job. 2. Also can recommend that the patient should continue with the Aquacel Ag over top which I think is doing a good job. 3. Will continue with ABD pad and Tubigrip size G single-layer which seems to be helpful. We will see patient back for reevaluation in 1 week here in the clinic. If anything worsens or changes patient will contact our office for additional recommendations. Electronic Signature(s) Signed: 09/10/2022 2:06:13 PM By: Worthy Keeler PA-C Entered By: Worthy Keeler on 09/10/2022 14:06:13 -------------------------------------------------------------------------------- SuperBill Details Patient Name: Date of Service: [name] 09/10/2022 Medical Record Number: [id number] Patient Account Number: [id number] [name], [name] ([id number]) [id number] Page 7 of 7 Date of Birth/Sex: Treating RN: 06/21/1955 (67 y.o. [name]) [name] Primary Care Provider: [name] Other Clinician: Referring Provider: Treating Provider/Extender: [name] in Treatment: 8 Diagnosis Coding ICD-10 Codes Code Description E11.622 Type 2 diabetes mellitus with other skin ulcer I89.0 Lymphedema, not elsewhere classified L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 L97.812 Non-pressure chronic ulcer of other part of right lower leg with fat layer exposed I10 Essential (primary) hypertension I50.42 Chronic combined systolic (congestive) and diastolic (congestive) heart failure I49.9 Cardiac arrhythmia, unspecified Facility Procedures : CPT4  Code: [id number] Description: 11042 - DEB SUBQ TISSUE 20 SQ CM/< ICD-10 Diagnosis Description L97.822 Non-pressure chronic ulcer of other part of left lower leg with fat layer expo Modifier: sed Quantity: 1 : CPT4 Code: [id number] Description: 11045 - DEB SUBQ TISS EA ADDL 20CM ICD-10 Diagnosis Description L97.822 Non-pressure chronic ulcer of other part of left lower leg with fat layer expo Modifier: sed Quantity: 1 Physician Procedures : CPT4 Code Description Modifier [id number] 11042 - WC PHYS SUBQ TISS 20 SQ CM ICD-10 Diagnosis Description L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 Quantity: 1 : [id number] 11045 - WC PHYS SUBQ TISS EA ADDL 20 CM ICD-10 Diagnosis Description L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 Quantity: 1 Electronic Signature(s)  Signed: 09/10/2022 2:06:22 PM By: Worthy Keeler PA-C Entered By: Worthy Keeler on 09/10/2022 14:06:21

## 2022-09-10 NOTE — Progress Notes (Addendum)
THEDFORD, LOVETTE ([id number]) [id number] Page 1 of 8 Visit Report for 09/10/2022 Arrival Information Details Patient Name: Date of Service: [name] 09/10/2022 1:30 PM Medical Record Number: [id number] Patient Account Number: [id number] Date of Birth/Sex: Treating RN: 09-Apr-1956 (66 y.o. [name]) [name] Primary Care Shuna Tabor: [name] Other Clinician: Referring Aloura Matsuoka: Treating Creed Kail/Extender: [name] in Treatment: 8 Visit Information History Since Last Visit Added or deleted any medications: No Patient Arrived: Ambulatory Any new allergies or adverse reactions: No Arrival Time: 13:27 Had a fall or experienced change in No Accompanied By: friend activities of daily living that may affect Transfer Assistance: None risk of falls: Patient Identification Verified: Yes Signs or symptoms of abuse/neglect since last visito No Secondary Verification Process Completed: Yes Hospitalized since last visit: No Patient Requires Transmission-Based Precautions: No Implantable device outside of the clinic excluding No Patient Has Alerts: No cellular tissue based products placed in the center since last visit: Has Dressing in Place as Prescribed: Yes Has Compression in Place as Prescribed: Yes Pain Present Now: No Electronic Signature(s) Signed: 09/11/2022 1:09:18 PM By: [name] RN Entered By: [name] on 09/10/2022 13:32:48 -------------------------------------------------------------------------------- Clinic Level of Care Assessment Details Patient Name: Date of Service: [name] 09/10/2022 1:30 PM Medical Record Number: [id number] Patient Account Number: [id number] Date of Birth/Sex: Treating RN: 06/18/1955 (66 y.o. [name]) [name] Primary Care Avalynne Diver: [name] Other Clinician: Referring Jermanie Minshall: Treating Miyoshi Ligas/Extender: [name] in Treatment: 8 Clinic Level of  Care Assessment Items TOOL 1 Quantity Score []  - 0 Use when EandM and Procedure is performed on INITIAL visit ASSESSMENTS - Nursing Assessment / Reassessment []  - 0 General Physical Exam (combine w/ comprehensive assessment (listed just below) when performed on new pt. evals) []  - 0 Comprehensive Assessment (HX, ROS, Risk Assessments, Wounds Hx, etc.) ASSESSMENTS - Wound and Skin Assessment / Reassessment []  - 0 Dermatologic / Skin Assessment (not related to wound area) ASSESSMENTS - Ostomy and/or Continence Assessment and Care []  - 0 Incontinence Assessment and Management []  - 0 Ostomy Care Assessment and Management (repouching, etc.) PROCESS - Coordination of Care []  - 0 Simple Patient / Family Education for ongoing care []  - 0 Complex (extensive) Patient / Family Education for ongoing care []  - 0 Staff obtains Programmer, systems, Records, T Results / Process Orders est []  - 0 Staff telephones HHA, Nursing Homes / Clarify orders / etc []  - 0 Routine Transfer to another Facility (non-emergent condition) []  - 0 Routine Hospital Admission (non-emergent condition) SUKHJIT, FLUCKIGER ([id number]) [id number] Page 2 of 8 []  - 0 New Admissions / Biomedical engineer / Ordering NPWT Apligraf, etc. , []  - 0 Emergency Hospital Admission (emergent condition) PROCESS - Special Needs []  - 0 Pediatric / Minor Patient Management []  - 0 Isolation Patient Management []  - 0 Hearing / Language / Visual special needs []  - 0 Assessment of Community assistance (transportation, D/C planning, etc.) []  - 0 Additional assistance / Altered mentation []  - 0 Support Surface(s) Assessment (bed, cushion, seat, etc.) INTERVENTIONS - Miscellaneous []  - 0 External ear exam []  - 0 Patient Transfer (multiple staff / Civil Service fast streamer / Similar devices) []  - 0 Simple Staple / Suture removal (25 or less) []  - 0 Complex Staple / Suture removal (26 or more) []  - 0 Hypo/Hyperglycemic  Management (do not check if billed separately) []  - 0 Ankle / Brachial Index (ABI) - do not check if billed separately Has the patient  been seen at the hospital within the last three years: Yes Total Score: 0 Level Of Care: ____ Electronic Signature(s) Signed: 09/11/2022 1:09:18 PM By: [name] RN Entered By: [name] on 09/10/2022 14:02:18 -------------------------------------------------------------------------------- Encounter Discharge Information Details Patient Name: Date of Service: Matthew Broom [name] 09/10/2022 1:30 PM Medical Record Number: [id number] Patient Account Number: [id number] Date of Birth/Sex: Treating RN: 11/02/1955 (66 y.o. [name]) [name] Primary Care Lucillia Corson: [name] Other Clinician: Referring Ko Bardon: Treating [name]/Extender: [name] in Treatment: 8 Encounter Discharge Information Items Post Procedure Vitals Discharge Condition: Stable Temperature (F): 98.2 Ambulatory Status: Ambulatory Pulse (bpm): 80 Discharge Destination: Home Respiratory Rate (breaths/min): 18 Transportation: Private Auto Blood Pressure (mmHg): 126/79 Accompanied By: friend Schedule Follow-up Appointment: Yes Clinical Summary of Care: Electronic Signature(s) Signed: 09/11/2022 1:09:18 PM By: [name] RN Entered By: [name] on 09/10/2022 14:03:51 -------------------------------------------------------------------------------- Lower Extremity Assessment Details Patient Name: Date of Service: [name] 09/10/2022 1:30 PM Medical Record Number: [id number] Patient Account Number: [id number] Date of Birth/Sex: Treating RN: 1956/02/27 (66 y.o. [name] Primary Care Bentlee Benningfield: [name] Other Clinician: Referring Darryon Bastin: Treating Abel Hageman/Extender: [name] in Treatment: 8 Edema Assessment G[Left: [name], [name] [id number] [Right: [id number] Page 3 of  8] Assessed: [Left: No] [Right: No] Edema: [Left: Yes] [Right: Yes] Calf Left: Right: Point of Measurement: 35 cm From Medial Instep 57 cm 66 cm Ankle Left: Right: Point of Measurement: 10 cm From Medial Instep 32 cm 33 cm Vascular Assessment Pulses: Dorsalis Pedis Palpable: [Left:Yes] [Right:Yes] Electronic Signature(s) Signed: 09/11/2022 1:09:18 PM By: [name] RN Entered By: [name] on 09/10/2022 13:42:02 -------------------------------------------------------------------------------- Multi Wound Chart Details Patient Name: Date of Service: Matthew Broom [name] 09/10/2022 1:30 PM Medical Record Number: [id number] Patient Account Number: [id number] Date of Birth/Sex: Treating RN: 1956-04-02 (66 y.o. [name]) [name] Primary Care Nasiya Pascual: [name] Other Clinician: Referring Aquinnah Devin: Treating Nivin Braniff/Extender: [name] in Treatment: 8 Vital Signs Height(in): 73 Pulse(bpm): 80 Weight(lbs): 358 Blood Pressure(mmHg): 126/79 Body Mass Index(BMI): 47.2 Temperature(F): 98.2 Respiratory Rate(breaths/min): 18 [1:Photos:] [N/A:N/A] Right, Anterior Lower Leg Left, Circumferential Lower Leg N/A Wound Location: Gradually Appeared Gradually Appeared N/A Wounding Event: Diabetic Wound/Ulcer of the Lower Diabetic Wound/Ulcer of the Lower N/A Primary Etiology: Extremity Extremity Arrhythmia, Congestive Heart Failure, Arrhythmia, Congestive Heart Failure, N/A Comorbid History: Hypertension, Type II Diabetes Hypertension, Type II Diabetes 06/15/2022 06/15/2022 N/A Date Acquired: 8 8 N/A Weeks of Treatment: Open Open N/A Wound Status: No No N/A Wound Recurrence: 0.5x0.5x0.1 8.5x26x0.3 N/A Measurements L x W x D (cm) 0.196 173.573 N/A A (cm) : rea 0.02 52.072 N/A Volume (cm) : 99.60% 71.10% N/A % Reduction in A rea: 99.60% 13.30% N/A % Reduction in Volume: Grade 1 Grade 1 N/A Classification: Medium Medium N/A Exudate A  mount: Serosanguineous Serosanguineous N/A Exudate Type: red, brown red, brown N/A Exudate Color: Medium (34-66%) Medium (34-66%) N/A Granulation A mount: Red Red N/A Granulation Quality: Medium (34-66%) Medium (34-66%) N/A Necrotic A mount: Fat Layer (Subcutaneous Tissue): Yes Fat Layer (Subcutaneous Tissue): Yes N/A Exposed Structures: Fascia: No Fascia: No [name], [name] ([id number]) [id number] Page 4 of 8 Tendon: No Tendon: No Muscle: No Muscle: No Joint: No Joint: No Bone: No Bone: No None None N/A Epithelialization: Treatment Notes Electronic Signature(s) Signed: 09/11/2022 1:09:18 PM By: [name] RN Previous Signature: 09/10/2022 12:51:24 PM Version By: [name] RN Entered By: [name] on 09/10/2022 13:42:09 -------------------------------------------------------------------------------- Multi-Disciplinary Care  Plan Details Patient Name: Date of Service: [name] 09/10/2022 1:30 PM Medical Record Number: [id number] Patient Account Number: [id number] Date of Birth/Sex: Treating RN: [birth year] (66 y.o. [name]) [name] Primary Care Quintavious Rinck: [name] Other Clinician: Referring Zarra Geffert: Treating Yasira Engelson/Extender: [name] in Treatment: 8 Active Inactive Wound/Skin Impairment Nursing Diagnoses: Knowledge deficit related to ulceration/compromised skin integrity Goals: Patient/caregiver will verbalize understanding of skin care regimen Date Initiated: 07/16/2022 Target Resolution Date: 09/14/2022 Goal Status: Active Ulcer/skin breakdown will have a volume reduction of 30% by week 4 Date Initiated: 07/16/2022 Date Inactivated: 09/10/2022 Target Resolution Date: 08/14/2022 Goal Status: Unmet Unmet Reason: comorbidties Ulcer/skin breakdown will have a volume reduction of 50% by week 8 Date Initiated: 07/16/2022 Target Resolution Date: 09/14/2022 Goal Status: Active Ulcer/skin breakdown will  have a volume reduction of 80% by week 12 Date Initiated: 07/16/2022 Target Resolution Date: 10/14/2022 Goal Status: Active Ulcer/skin breakdown will heal within 14 weeks Date Initiated: 07/16/2022 Target Resolution Date: 11/14/2022 Goal Status: Active Interventions: Assess patient/caregiver ability to obtain necessary supplies Assess patient/caregiver ability to perform ulcer/skin care regimen upon admission and as needed Assess ulceration(s) every visit Notes: Electronic Signature(s) Signed: 09/11/2022 1:09:18 PM By: [name] RN Entered By: [name] on 09/10/2022 13:43:10 -------------------------------------------------------------------------------- Pain Assessment Details Patient Name: Date of Service: [name] 09/10/2022 1:30 PM Medical Record Number: [id number] Patient Account Number: [id number] Date of Birth/Sex: Treating RN: September 15, 1955 (66 y.o. [name] Primary Care Nitya Cauthon: [name] Other Clinician: Referring Yekaterina Escutia: Treating Nicklas Mcsweeney/Extender: [name] in Treatment: 8 [name], [name] ([id number]) [phone].pdf Page 5 of 8 Active Problems Location of Pain Severity and Description of Pain Patient Has Paino No Site Locations Pain Management and Medication Current Pain Management: Electronic Signature(s) Signed: 09/11/2022 1:09:18 PM By: [name] RN Entered By: [name] on 09/10/2022 13:33:20 -------------------------------------------------------------------------------- Patient/Caregiver Education Details Patient Name: Date of Service: [name] 3/28/2024andnbsp1:30 PM Medical Record Number: [id number] Patient Account Number: [id number] Date of Birth/Gender: Treating RN: Nov 05, 1955 (66 y.o. [name]) [name] Primary Care Physician: [name] Other Clinician: Referring Physician: Treating Physician/Extender: [name] in Treatment: 8 Education  Assessment Education Provided To: Patient Education Topics Provided Wound Debridement: Handouts: Wound Debridement Methods: Explain/Verbal Responses: State content correctly Electronic Signature(s) Signed: 09/11/2022 1:09:18 PM By: [name] RN Entered By: [name] on 09/10/2022 13:43:26 -------------------------------------------------------------------------------- Wound Assessment Details Patient Name: Date of Service: [name] 09/10/2022 1:30 PM Medical Record Number: [id number] Patient Account Number: [id number] Date of Birth/Sex: Treating RN: Oct 19, 1955 (66 y.o. [name] Primary Care Calen Geister: [name] Other Clinician: Referring Jaysie Benthall: Treating Maher Shon/Extender: [name] in Treatment: 8 [name], [name] ([id number]) [id number] Page 6 of 8 Wound Status Wound Number: 1 Primary Diabetic Wound/Ulcer of the Lower Extremity Etiology: Wound Location: Right, Anterior Lower Leg Wound Status: Open Wounding Event: Gradually Appeared Comorbid Arrhythmia, Congestive Heart Failure, Hypertension, Type II Date Acquired: 06/15/2022 History: Diabetes Weeks Of Treatment: 8 Clustered Wound: No Photos Wound Measurements Length: (cm) 0.5 Width: (cm) 0.5 Depth: (cm) 0.1 Area: (cm) 0.196 Volume: (cm) 0.02 % Reduction in Area: 99.6% % Reduction in Volume: 99.6% Epithelialization: None Tunneling: No Undermining: No Wound Description Classification: Grade 1 Exudate Amount: Medium Exudate Type: Serosanguineous Exudate Color: red, brown Foul Odor After Cleansing: No Slough/Fibrino Yes Wound Bed Granulation Amount: Medium (34-66%) Exposed Structure Granulation Quality: Red Fascia Exposed: No Necrotic Amount: Medium (34-66%) Fat Layer (  Subcutaneous Tissue) Exposed: Yes Tendon Exposed: No Muscle Exposed: No Joint Exposed: No Bone Exposed: No Treatment Notes Wound #1 (Lower Leg) Wound Laterality:  Right, Anterior Cleanser Soap and Water Discharge Instruction: Gently cleanse wound with antibacterial soap, rinse and pat dry prior to dressing wounds Peri-Wound Care Topical Keystone gel Primary Dressing AquacelAg Advantage Dressing, 4x5 (in/in) Discharge Instruction: Apply to wound as directed Secondary Dressing ABD Pad 5x9 (in/in) Discharge Instruction: Cover with ABD pad Secured With Tubigrip Size G, 4.5x10 (in/yd) Discharge Instruction: single layer Compression Wrap Compression Stockings Add-Ons [name], [name] ([id number]) [id number] Page 7 of 8 Electronic Signature(s) Signed: 09/11/2022 1:09:18 PM By: [name] RN Entered By: [name] on 09/10/2022 13:40:24 -------------------------------------------------------------------------------- Wound Assessment Details Patient Name: Date of Service: Matthew Broom [name] 09/10/2022 1:30 PM Medical Record Number: [id number] Patient Account Number: [id number] Date of Birth/Sex: Treating RN: 1955/07/31 (66 y.o. [name]) [name] Primary Care Benita Boonstra: [name] Other Clinician: Referring Jocelyne Reinertsen: Treating Jeorge Reister/Extender: [name] in Treatment: 8 Wound Status Wound Number: 2 Primary Diabetic Wound/Ulcer of the Lower Extremity Etiology: Wound Location: Left, Circumferential Lower Leg Wound Status: Open Wounding Event: Gradually Appeared Comorbid Arrhythmia, Congestive Heart Failure, Hypertension, Type II Date Acquired: 06/15/2022 History: Diabetes Weeks Of Treatment: 8 Clustered Wound: No Photos Wound Measurements Length: (cm) 8.5 Width: (cm) 26 Depth: (cm) 0.3 Area: (cm) 173.573 Volume: (cm) 52.072 % Reduction in Area: 71.1% % Reduction in Volume: 13.3% Epithelialization: None Tunneling: No Undermining: No Wound Description Classification: Grade 1 Exudate Amount: Medium Exudate Type: Serosanguineous Exudate Color: red, brown Foul Odor After  Cleansing: No Slough/Fibrino Yes Wound Bed Granulation Amount: Medium (34-66%) Exposed Structure Granulation Quality: Red Fascia Exposed: No Necrotic Amount: Medium (34-66%) Fat Layer (Subcutaneous Tissue) Exposed: Yes Tendon Exposed: No Muscle Exposed: No Joint Exposed: No Bone Exposed: No Treatment Notes Wound #2 (Lower Leg) Wound Laterality: Left, Circumferential Cleanser Soap and Water Discharge Instruction: Gently cleanse wound with antibacterial soap, rinse and pat dry prior to dressing wounds Peri-Wound Care Topical Keystone gel Primary Dressing [name], [name] ([id number]) [id number] Page 8 of 8 AquacelAg Advantage Dressing, 4x5 (in/in) Discharge Instruction: Apply to wound as directed Secondary Dressing ABD Pad 5x9 (in/in) Discharge Instruction: Cover with ABD pad Secured With Tubigrip Size G, 4.5x10 (in/yd) Discharge Instruction: single layer Compression Wrap Compression Stockings Add-Ons Electronic Signature(s) Signed: 09/11/2022 1:09:18 PM By: [name] RN Entered By: [name] on 09/10/2022 13:41:00 -------------------------------------------------------------------------------- Vitals Details Patient Name: Date of Service: Matthew Broom [name] 09/10/2022 1:30 PM Medical Record Number: [id number] Patient Account Number: [id number] Date of Birth/Sex: Treating RN: 11-05-1955 (66 y.o. [name]) [name] Primary Care Dennie Moltz: [name] Other Clinician: Referring Keagan Brislin: Treating Amily Depp/Extender: [name] in Treatment: 8 Vital Signs Time Taken: 13:32 Temperature (F): 98.2 Height (in): 73 Pulse (bpm): 80 Weight (lbs): 358 Respiratory Rate (breaths/min): 18 Body Mass Index (BMI): 47.2 Blood Pressure (mmHg): 126/79 Reference Range: 80 - 120 mg / dl Electronic Signature(s) Signed: 09/11/2022 1:09:18 PM By: [name] RN Entered By: [name] on 09/10/2022 13:33:11

## 2022-09-17 ENCOUNTER — Encounter: Payer: PPO | Attending: Physician Assistant | Admitting: Physician Assistant

## 2022-09-17 DIAGNOSIS — L97822 Non-pressure chronic ulcer of other part of left lower leg with fat layer exposed: Secondary | ICD-10-CM | POA: Diagnosis not present

## 2022-09-17 DIAGNOSIS — I5042 Chronic combined systolic (congestive) and diastolic (congestive) heart failure: Secondary | ICD-10-CM | POA: Diagnosis not present

## 2022-09-17 DIAGNOSIS — I110 Hypertensive heart disease with heart failure: Secondary | ICD-10-CM | POA: Insufficient documentation

## 2022-09-17 DIAGNOSIS — I890 Lymphedema, not elsewhere classified: Secondary | ICD-10-CM | POA: Insufficient documentation

## 2022-09-17 DIAGNOSIS — E11622 Type 2 diabetes mellitus with other skin ulcer: Secondary | ICD-10-CM | POA: Diagnosis not present

## 2022-09-17 DIAGNOSIS — I499 Cardiac arrhythmia, unspecified: Secondary | ICD-10-CM | POA: Diagnosis not present

## 2022-09-17 DIAGNOSIS — L97812 Non-pressure chronic ulcer of other part of right lower leg with fat layer exposed: Secondary | ICD-10-CM | POA: Insufficient documentation

## 2022-09-17 NOTE — Progress Notes (Addendum)
EMIR, STROMMER ([id number]) [id number] Page 1 of 8 Visit Report for 09/17/2022 Arrival Information Details Patient Name: Date of Service: [name] 09/17/2022 2:45 PM Medical Record Number: [id number] Patient Account Number: [id number] Date of Birth/Sex: Treating RN: 07/10/1955 (67 y.o. [name] Primary Care Lachrisha Ziebarth: [name] Other Clinician: [name] Referring Fawn Desrocher: Treating Texanna Hilburn/Extender: Matthew Folks in Treatment: 9 Visit Information History Since Last Visit All ordered tests and consults were completed: No Patient Arrived: [name] Added or deleted any medications: No Arrival Time: 15:14 Any new allergies or adverse reactions: No Transfer Assistance: None Had a fall or experienced change in No Patient Identification Verified: Yes activities of daily living that may affect Secondary Verification Process Completed: Yes risk of falls: Patient Requires Transmission-Based Precautions: No Signs or symptoms of abuse/neglect since last visito No Patient Has Alerts: No Hospitalized since last visit: No Implantable device outside of the clinic excluding No cellular tissue based products placed in the center since last visit: Has Dressing in Place as Prescribed: Yes Has Compression in Place as Prescribed: Yes Pain Present Now: Yes Electronic Signature(s) Signed: 09/18/2022 11:43:38 AM By: [name] Entered By: [name] on 09/17/2022 15:15:09 -------------------------------------------------------------------------------- Clinic Level of Care Assessment Details Patient Name: Date of Service: [name] 09/17/2022 2:45 PM Medical Record Number: [id number] Patient Account Number: [id number] Date of Birth/Sex: Treating RN: 06/13/1956 (67 y.o. [name] Primary Care Cainen Burnham: [name] Other Clinician: [name] Referring Nakai Yard: Treating Allien Melberg/Extender: Matthew Folks in Treatment: 9 Clinic Level of Care Assessment Items TOOL 1 Quantity Score []  - 0 Use when EandM and Procedure is performed on INITIAL visit ASSESSMENTS - Nursing Assessment / Reassessment []  - 0 General Physical Exam (combine w/ comprehensive assessment (listed just below) when performed on new pt. evals) []  - 0 Comprehensive Assessment (HX, ROS, Risk Assessments, Wounds Hx, etc.) ASSESSMENTS - Wound and Skin Assessment / Reassessment []  - 0 Dermatologic / Skin Assessment (not related to wound area) ASSESSMENTS - Ostomy and/or Continence Assessment and Care []  - 0 Incontinence Assessment and Management []  - 0 Ostomy Care Assessment and Management (repouching, etc.) PROCESS - Coordination of Care []  - 0 Simple Patient / Family Education for ongoing care []  - 0 Complex (extensive) Patient / Family Education for ongoing care []  - 0 Staff obtains Consents, Records, T Results / Process Orders est []  - 0 Staff telephones HHA, Nursing Homes / Clarify orders / etc []  - 0 Routine Transfer to another Facility (non-emergent condition) []  - 0 Routine Hospital Admission (non-emergent condition) [name], [name] ([id number]) [id number] Page 2 of 8 []  - 0 New Admissions / Manufacturing engineer / Ordering NPWT Apligraf, etc. , []  - 0 Emergency Hospital Admission (emergent condition) PROCESS - Special Needs []  - 0 Pediatric / Minor Patient Management []  - 0 Isolation Patient Management []  - 0 Hearing / Language / Visual special needs []  - 0 Assessment of Community assistance (transportation, D/C planning, etc.) []  - 0 Additional assistance / Altered mentation []  - 0 Support Surface(s) Assessment (bed, cushion, seat, etc.) INTERVENTIONS - Miscellaneous []  - 0 External ear exam []  - 0 Patient Transfer (multiple staff / Nurse, adult / Similar devices) []  - 0 Simple Staple / Suture removal (25 or less) []  - 0 Complex Staple / Suture  removal (26 or more) []  - 0 Hypo/Hyperglycemic Management (do not check if billed separately) []  - 0 Ankle / Brachial Index (ABI) - do  not check if billed separately Has the patient been seen at the hospital within the last three years: Yes Total Score: 0 Level Of Care: ____ Electronic Signature(s) Signed: 09/18/2022 11:43:38 AM By: [name] Entered By: [name] on 09/17/2022 16:08:03 -------------------------------------------------------------------------------- Encounter Discharge Information Details Patient Name: Date of Service: [name] [name] 09/17/2022 2:45 PM Medical Record Number: [id number] Patient Account Number: [id number] Date of Birth/Sex: Treating RN: [birth year] (67 y.o. [name] Primary Care Didi Ganaway: [name] Other Clinician: [name] Referring Kylan Liberati: Treating Kaire Stary/Extender: Matthew Folks in Treatment: 9 Encounter Discharge Information Items Post Procedure Vitals Discharge Condition: Stable Temperature (F): 97.7 Ambulatory Status: Ambulatory Pulse (bpm): 70 Discharge Destination: Home Respiratory Rate (breaths/min): 18 Transportation: Private Auto Blood Pressure (mmHg): 136/83 Accompanied By: girlfriend Schedule Follow-up Appointment: Yes Clinical Summary of Care: Electronic Signature(s) Signed: 09/18/2022 11:43:38 AM By: [name] Entered By: [name] on 09/17/2022 17:43:27 -------------------------------------------------------------------------------- Lower Extremity Assessment Details Patient Name: Date of Service: [name] 09/17/2022 2:45 PM Medical Record Number: [id number] Patient Account Number: [id number] Date of Birth/Sex: Treating RN: 28-Oct-1955 (67 y.o. [name] Primary Care Nikkole Placzek: [name] Other Clinician: [name] Referring Tonea Leiphart: Treating Snow Peoples/Extender: Matthew Folks in Treatment: 9 Edema Assessment G[Left: [name], [name] ([id number])] [name]: [id number] Page 3 of 8] Assessed: [Left: Yes] [Right: Yes] Edema: [Left: Yes] [Right: Yes] Calf Left: Right: Point of Measurement: 35 cm From Medial Instep 59.2 cm 64.4 cm Ankle Left: Right: Point of Measurement: 10 cm From Medial Instep 33.5 cm 32 cm Vascular Assessment Pulses: Dorsalis Pedis Palpable: [Left:Yes] [Right:Yes] Electronic Signature(s) Signed: 09/18/2022 9:18:23 AM By: Elliot Gurney, [name], RN, [name], [name], [name] Signed: 09/18/2022 11:43:38 AM By: [name] Entered By: [name] on 09/17/2022 15:37:16 -------------------------------------------------------------------------------- Multi Wound Chart Details Patient Name: Date of Service: [name] [name] 09/17/2022 2:45 PM Medical Record Number: [id number] Patient Account Number: [id number] Date of Birth/Sex: Treating RN: Jan 08, 1956 (67 y.o. [name], [name] Primary Care Rilei Kravitz: [name] Other Clinician: [name] Referring Tailor Westfall: Treating Ellianna Ruest/Extender: Matthew Folks in Treatment: 9 Vital Signs Height(in): 73 Pulse(bpm): 70 Weight(lbs): 358 Blood Pressure(mmHg): 136/83 Body Mass Index(BMI): 47.2 Temperature(F): 97.7 Respiratory Rate(breaths/min): 18 [1:Photos:] [N/A:N/A] Right, Anterior Lower Leg Left, Circumferential Lower Leg N/A Wound Location: Gradually Appeared Gradually Appeared N/A Wounding Event: Diabetic Wound/Ulcer of the Lower Diabetic Wound/Ulcer of the Lower N/A Primary Etiology: Extremity Extremity Arrhythmia, Congestive Heart Failure, Arrhythmia, Congestive Heart Failure, N/A Comorbid History: Hypertension, Type II Diabetes Hypertension, Type II Diabetes 06/15/2022 06/15/2022 N/A Date Acquired: 9 9 N/A Weeks of Treatment: Open Open N/A Wound Status: No No N/A Wound Recurrence: 0.1x0.1x0.1 8x25.5x0.3 N/A Measurements L x W x D (cm) 0.008 160.221 N/A A (cm) : [name], [name] ([id number])  [id number] Page 4 of 8 0.001 48.066 N/A Volume (cm) : 100.00% 73.30% N/A % Reduction in A rea: 100.00% 20.00% N/A % Reduction in Volume: Grade 1 Grade 1 N/A Classification: Medium Medium N/A Exudate A mount: Serosanguineous Serosanguineous N/A Exudate Type: red, brown red, brown N/A Exudate Color: Medium (34-66%) Medium (34-66%) N/A Granulation A mount: Red Red N/A Granulation Quality: Medium (34-66%) Medium (34-66%) N/A Necrotic A mount: Fat Layer (Subcutaneous Tissue): Yes Fat Layer (Subcutaneous Tissue): Yes N/A Exposed Structures: Fascia: No Fascia: No Tendon: No Tendon: No Muscle: No Muscle: No Joint: No Joint: No Bone: No Bone: No None None N/A Epithelialization: Treatment Notes Electronic Signature(s) Signed: 09/18/2022 11:43:38 AM By:  [name] Entered By: [name] on 09/17/2022 15:37:29 -------------------------------------------------------------------------------- Multi-Disciplinary Care Plan Details Patient Name: Date of Service: [name] 09/17/2022 2:45 PM Medical Record Number: [id number] Patient Account Number: [id number] Date of Birth/Sex: Treating RN: 08/21/1955 (67 y.o. [name] Primary Care Gerlad Pelzel: [name] Other Clinician: [name] Referring Kyleeann Cremeans: Treating Nannie Starzyk/Extender: Matthew Folks in Treatment: 9 Active Inactive Wound/Skin Impairment Nursing Diagnoses: Knowledge deficit related to ulceration/compromised skin integrity Goals: Patient/caregiver will verbalize understanding of skin care regimen Date Initiated: 07/16/2022 Target Resolution Date: 09/14/2022 Goal Status: Active Ulcer/skin breakdown will have a volume reduction of 30% by week 4 Date Initiated: 07/16/2022 Date Inactivated: 09/10/2022 Target Resolution Date: 08/14/2022 Goal Status: Unmet Unmet Reason: comorbidties Ulcer/skin breakdown will have a volume reduction of 50% by week 8 Date  Initiated: 07/16/2022 Target Resolution Date: 09/14/2022 Goal Status: Active Ulcer/skin breakdown will have a volume reduction of 80% by week 12 Date Initiated: 07/16/2022 Target Resolution Date: 10/14/2022 Goal Status: Active Ulcer/skin breakdown will heal within 14 weeks Date Initiated: 07/16/2022 Target Resolution Date: 11/14/2022 Goal Status: Active Interventions: Assess patient/caregiver ability to obtain necessary supplies Assess patient/caregiver ability to perform ulcer/skin care regimen upon admission and as needed Assess ulceration(s) every visit Notes: Electronic Signature(s) Signed: 09/18/2022 9:18:23 AM By: Elliot Gurney, [name], RN, [name], [name], [name] Signed: 09/18/2022 11:43:38 AM By: [name] Entered By: [name] on 09/17/2022 16:19:12 [name] ([id number]) [id number] Page 5 of 8 -------------------------------------------------------------------------------- Pain Assessment Details Patient Name: Date of Service: [name] 09/17/2022 2:45 PM Medical Record Number: [id number] Patient Account Number: [id number] Date of Birth/Sex: Treating RN: [birth year] (67 y.o. [name] Primary Care Shyla Gayheart: [name] Other Clinician: [name] Referring Yazmen Briones: Treating Shinita Mac/Extender: Matthew Folks in Treatment: 9 Active Problems Location of Pain Severity and Description of Pain Patient Has Paino Yes Site Locations Pain Location: Pain in Ulcers Duration of the Pain. Constant / Intermittento Constant Rate the pain. Current Pain Level: 2 Character of Pain Describe the Pain: Dull Pain Management and Medication Current Pain Management: Medication: Yes Cold Application: No Rest: Yes Massage: No Activity: No T.E.N.S.: No Heat Application: No Leg drop or elevation: No Is the Current Pain Management Adequate: Inadequate How does your wound impact your activities of daily livingo Sleep: No Bathing:  No Appetite: No Relationship With Others: No Bladder Continence: No Emotions: No Bowel Continence: No Work: No Toileting: No Drive: No Dressing: No Hobbies: No Psychologist, prison and probation services) Signed: 09/18/2022 9:18:23 AM By: Elliot Gurney, [name], RN, [name], [name], [name] Signed: 09/18/2022 11:43:38 AM By: [name] Entered By: [name] on 09/17/2022 15:19:47 -------------------------------------------------------------------------------- Patient/Caregiver Education Details Patient Name: Date of Service: [name] 4/4/2024andnbsp2:45 PM Medical Record Number: [id number] Patient Account Number: [id number] Date of Birth/Gender: Treating RN: Sep 17, 1955 (67 y.o. [name] Primary Care Physician: [name] Other Clinician: [name] Referring Physician: Treating Physician/Extender: Matthew Folks in Treatment: 9 Education Assessment Education Provided To: [name], [name] ([id number]) [id number] Page 6 of 8 Patient Education Topics Provided Wound/Skin Impairment: Handouts: Other: continue wound care as directed Methods: Explain/Verbal Responses: State content correctly Electronic Signature(s) Signed: 09/18/2022 11:43:38 AM By: [name] Entered By: [name] on 09/17/2022 17:42:17 -------------------------------------------------------------------------------- Wound Assessment Details Patient Name: Date of Service: Matthew Macadam J. 09/17/2022 2:45 PM Medical Record Number: [id number] Patient Account Number: [id number] Date of Birth/Sex: Treating RN: 1955/07/12 (67 y.o. [name] Primary Care Neizan Debruhl: [name]  Other Clinician: [name] Referring Sidnie Swalley: Treating Velencia Lenart/Extender: Matthew Folks in Treatment: 9 Wound Status Wound Number: 1 Primary Diabetic Wound/Ulcer of the Lower Extremity Etiology: Wound Location: Right, Anterior Lower Leg Wound Status: Healed -  Epithelialized Wounding Event: Gradually Appeared Comorbid Arrhythmia, Congestive Heart Failure, Hypertension, Type II Date Acquired: 06/15/2022 History: Diabetes Weeks Of Treatment: 9 Clustered Wound: No Photos Wound Measurements Length: (cm) Width: (cm) Depth: (cm) Area: (cm) Volume: (cm) 0 % Reduction in Area: 100% 0 % Reduction in Volume: 100% 0 Epithelialization: Large (67-100%) 0 0 Wound Description Classification: Grade 1 Exudate Amount: None Present Foul Odor After Cleansing: No Slough/Fibrino No Wound Bed Granulation Amount: None Present (0%) Exposed Structure Necrotic Amount: None Present (0%) Fascia Exposed: No Fat Layer (Subcutaneous Tissue) Exposed: Yes Tendon Exposed: No Muscle Exposed: No Joint Exposed: No Bone Exposed: No Treatment Notes Wound #1 (Lower Leg) Wound Laterality: Right, Anterior ADIN, LAKER ([id number]) [phone] Page 7 of 8 Cleanser Peri-Wound Care Topical Primary Dressing Secondary Dressing Secured With Compression Wrap Compression Stockings Add-Ons Electronic Signature(s) Signed: 09/18/2022 9:18:23 AM By: Elliot Gurney, [name], RN, [name], [name], [name] Signed: 09/18/2022 11:43:38 AM By: [name] Entered By: [name] on 09/17/2022 15:59:32 -------------------------------------------------------------------------------- Wound Assessment Details Patient Name: Date of Service: [name] [name] 09/17/2022 2:45 PM Medical Record Number: [id number] Patient Account Number: [id number] Date of Birth/Sex: Treating RN: Jun 11, 1956 (67 y.o. [name], [name] Primary Care Adonijah Baena: [name] Other Clinician: [name] Referring Maloni Musleh: Treating Malyia Moro/Extender: Matthew Folks in Treatment: 9 Wound Status Wound Number: 2 Primary Diabetic Wound/Ulcer of the Lower Extremity Etiology: Wound Location: Left, Circumferential Lower Leg Wound Status: Open Wounding Event: Gradually  Appeared Comorbid Arrhythmia, Congestive Heart Failure, Hypertension, Type II Date Acquired: 06/15/2022 History: Diabetes Weeks Of Treatment: 9 Clustered Wound: No Photos Wound Measurements Length: (cm) 8 Width: (cm) 25.5 Depth: (cm) 0.3 Area: (cm) 160.221 Volume: (cm) 48.066 % Reduction in Area: 73.3% % Reduction in Volume: 20% Epithelialization: None Wound Description Classification: Grade 1 Exudate Amount: Medium Exudate Type: Serosanguineous Exudate Color: red, brown Foul Odor After Cleansing: No Slough/Fibrino Yes Wound Bed Granulation Amount: Medium (34-66%) Exposed Structure Granulation Quality: Red Fascia Exposed: No Necrotic Amount: Medium (34-66%) Fat Layer (Subcutaneous Tissue) Exposed: Yes Tendon Exposed: No Muscle Exposed: No [name], [name] ([id number]) [id number] Page 8 of 8 Joint Exposed: No Bone Exposed: No Treatment Notes Wound #2 (Lower Leg) Wound Laterality: Left, Circumferential Cleanser Soap and Water Discharge Instruction: Gently cleanse wound with antibacterial soap, rinse and pat dry prior to dressing wounds Peri-Wound Care Topical Keystone gel Primary Dressing SILVERCEL Antimicrobial Alginate Dressing, 1x12 (in/in) Secondary Dressing ABD Pad 5x9 (in/in) Discharge Instruction: Cover with ABD pad Secured With Tubigrip Size G, 4.5x10 (in/yd) Discharge Instruction: single layer Compression Wrap Compression Stockings Add-Ons Electronic Signature(s) Signed: 09/18/2022 9:18:23 AM By: Elliot Gurney, [name], RN, [name], [name], [name] Signed: 09/18/2022 11:43:38 AM By: [name] Entered By: [name] on 09/17/2022 15:32:18 -------------------------------------------------------------------------------- Vitals Details Patient Name: Date of Service: [name] [name] 09/17/2022 2:45 PM Medical Record Number: [id number] Patient Account Number: [id number] Date of Birth/Sex: Treating RN: [birth year] (67 y.o. [name],  [name] Primary Care Mounir Skipper: [name] Other Clinician: [name] Referring Sherryll Skoczylas: Treating Tresea Heine/Extender: Matthew Folks in Treatment: 9 Vital Signs Time Taken: 15:15 Temperature (F): 97.7 Height (in): 73 Pulse (bpm): 70 Weight (lbs): 358 Respiratory Rate (breaths/min): 18 Body Mass Index (BMI): 47.2 Blood Pressure (mmHg): 136/83 Reference Range: 80 -  120 mg / dl Electronic Signature(s) Signed: 09/18/2022 11:43:38 AM By: [name] Entered By: [name] on 09/17/2022 15:19:41

## 2022-09-17 NOTE — Progress Notes (Addendum)
Matthew, [name] ([id number]) [id number] Page 1 of 7 Visit Report for 09/17/2022 Chief Complaint Document Details Patient Name: Date of Service: [name] 09/17/2022 2:45 PM Medical Record Number: [id number] Patient Account Number: [id number] Date of Birth/Sex: Treating RN: [birth year] (67 y.o. [name] Primary Care Provider: [name] Other Clinician: Betha [name] Referring Provider: Treating Provider/Extender: [name] in Treatment: 9 Information Obtained from: Patient Chief Complaint Bilateral LE ulcers and lymphedema Electronic Signature(s) Signed: 09/17/2022 2:43:55 PM By: Matthew Kelp PA-C Entered By: [name] on 09/17/2022 14:43:55 -------------------------------------------------------------------------------- Debridement Details Patient Name: Date of Service: [name] [name] 09/17/2022 2:45 PM Medical Record Number: [id number] Patient Account Number: [id number] Date of Birth/Sex: Treating RN: Apr 04, 1956 (67 y.o. [name], [name] Primary Care Provider: [name] Other Clinician: Betha [name] Referring Provider: Treating Provider/Extender: [name] in Treatment: 9 Debridement Performed for Assessment: Wound #2 Left,Circumferential Lower Leg Performed By: Physician [name]., PA-C Debridement Type: Debridement Severity of Tissue Pre Debridement: Fat layer exposed Level of Consciousness (Pre-procedure): Awake and Alert Pre-procedure Verification/Time Out Yes - 16:00 Taken: Start Time: 16:00 T Area Debrided (L x W): otal 3 (cm) x 5 (cm) = 15 (cm) Tissue and other material debrided: Viable, Non-Viable, Slough, Subcutaneous, Slough Level: Skin/Subcutaneous Tissue Debridement Description: Excisional Instrument: Curette Bleeding: Minimum Hemostasis Achieved: Pressure Response to Treatment: Procedure was tolerated well Level of Consciousness (Post- Awake and  Alert procedure): Post Debridement Measurements of Total Wound Length: (cm) 8 Width: (cm) 25.5 Depth: (cm) 0.3 Volume: (cm) 48.066 Character of Wound/Ulcer Post Debridement: Stable Severity of Tissue Post Debridement: Fat layer exposed Post Procedure Diagnosis Same as Pre-procedure Electronic Signature(s) Signed: 09/18/2022 8:47:01 AM By: Matthew Kelp PA-C Signed: 09/18/2022 9:18:23 AM By: [name] [name], RN, [name], [name] Signed: 09/18/2022 11:43:38 AM By: [name] Entered By: [name] on 09/17/2022 16:05:40 [name] ([id number]) [id number] Page 2 of 7 -------------------------------------------------------------------------------- HPI Details Patient Name: Date of Service: [name] 09/17/2022 2:45 PM Medical Record Number: [id number] Patient Account Number: [id number] Date of Birth/Sex: Treating RN: 23-Oct-1955 (67 y.o. [name] Primary Care Provider: [name] Other Clinician: Betha [name] Referring Provider: Treating Provider/Extender: [name] in Treatment: 9 History of Present Illness HPI Description: 07-16-2022 upon evaluation today patient appears to be doing poorly currently in regard to his his legs. The left leg is actually worse than the right. This is a patient that is previously been seen in the [place] office and at that point was doing really quite well with compression wrapping and often alginate are either Hydrofera Blue dressings. Fortunately he has gone since November 2020 until just current with out having any issue or need for wound care services which is great news. Unfortunately he is here today because he is having some issues at this point. Patient does have a history of several medical conditions which include diabetes mellitus type 2, lymphedema, hypertension, congestive heart failure, and cardiac arrhythmia though is not sure that this is actually atrial  fibrillation based on what he has been told. He does have lymphedema pumps but tells me he has not used them since the summer when he had to move to a remodel of his building and subsequently never unpacked them. 07-23-2022 upon evaluation today patient appears to be doing well with regard to his legs. He is going require some sharp debridement today but overall seems to be making  some progress. I am pleased in that regard he has much less drainage that he had did last week I think the compression wraps are definitely helping. 07-30-2022 upon evaluation today patient appears to be doing poorly in regard to his legs he actually has blue-green drainage which I think is consistent with Pseudomonas. I believe that we need to address this as soon as possible and I discussed that with the patient today as well. Fortunately I do not see any signs of active infection locally nor systemically at this time which is great news. No fevers, chills, nausea, vomiting, or diarrhea. 08-13-2022 upon evaluation today patient appears to be doing well currently in regard to his leg ulcers which are actually looking much better. Fortunately there does not appear to be any signs of active infection at this time which is great news. No fevers, chills, nausea, vomiting, or diarrhea. 3/7; patient with predominantly a left medial lower extremity wound as well as several smaller areas and a small area on the right lateral. His [name] came in today. We are using silver alginate as the primary dressing 09-03-2022 upon evaluation today patient appears to be doing well currently in regard to his wounds. He has been tolerating the dressing changes without complication. Fortunately there does not appear to be any signs of infection there is needed however for sharp debridement. 09-10-2022 upon evaluation today patient appears to be doing well currently in regard to his wound. Has been tolerating the dressing changes without complication.  Fortunately there does not appear to be any signs of infection looking systemically which is great news and overall I am extremely pleased with where we stand today. I do not see any signs of infection. 09-17-2022 upon evaluation today patient actually is making signs of improvement the good news is he is making good improvement. With that being said he still is having quite a time keeping the swelling down. We have been using Tubigrip I think that still probably our best option but nonetheless I think that we are making progress with regard to his wounds. Electronic Signature(s) Signed: 09/17/2022 5:55:11 PM By: Matthew Kelp PA-C Entered By: [name] on 09/17/2022 17:55:10 -------------------------------------------------------------------------------- Physical Exam Details Patient Name: Date of Service: [name] 09/17/2022 2:45 PM Medical Record Number: [id number] Patient Account Number: [id number] Date of Birth/Sex: Treating RN: 1955/07/16 (67 y.o. [name] Primary Care Provider: [name] Other Clinician: Betha [name] Referring Provider: Treating Provider/Extender: [name] in Treatment: 9 Constitutional Well-nourished and well-hydrated in no acute distress. Respiratory normal breathing without difficulty. Psychiatric this patient is able to make decisions and demonstrates good insight into disease process. Alert and Oriented x 3. pleasant and cooperative. Notes I did have to perform sharp debridement today to clearway some necrotic debris he tolerated the debridement today without complication and postdebridement. The good news is the wound is significantly improved all locations postdebridement. Electronic Signature(s) Signed: 09/17/2022 5:55:50 PM By: Bubba Hales ([id number]) [id number] Page 3 of 7 Entered By: [name] on 09/17/2022  17:55:50 -------------------------------------------------------------------------------- Physician Orders Details Patient Name: Date of Service: [name] 09/17/2022 2:45 PM Medical Record Number: [id number] Patient Account Number: [id number] Date of Birth/Sex: Treating RN: 01/27/1956 (67 y.o. [name] Primary Care Provider: [name] Other Clinician: Betha [name] Referring Provider: Treating Provider/Extender: [name] in Treatment: 9 Verbal / Phone Orders: Yes Clinician: Huel Coventry Read Back and  Verified: Yes Diagnosis Coding ICD-10 Coding Code Description E11.622 Type 2 diabetes mellitus with other skin ulcer I89.0 Lymphedema, not elsewhere classified L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 L97.812 Non-pressure chronic ulcer of other part of right lower leg with fat layer exposed I10 Essential (primary) hypertension I50.42 Chronic combined systolic (congestive) and diastolic (congestive) heart failure I49.9 Cardiac arrhythmia, unspecified Follow-up Appointments Return Appointment in 1 week. Nurse Visit as needed Bathing/ Shower/ Hygiene May shower with wound dressing protected with water repellent cover or cast protector. Anesthetic (Use 'Patient Medications' Section for Anesthetic Order Entry) Lidocaine applied to wound bed Edema Control - Lymphedema / Segmental Compressive Device / Other Elevate, Exercise Daily and A void Standing for Long Periods of Time. Elevate legs to the level of the heart and pump ankles as often as possible Elevate leg(s) parallel to the floor when sitting. Compression Pump: Use compression pump on left lower extremity for 60 minutes, twice daily. Compression Pump: Use compression pump on right lower extremity for 60 minutes, twice daily. DO YOUR BEST to sleep in the bed at night. DO NOT sleep in your recliner. Long hours of sitting in a recliner leads to  swelling of the legs and/or potential wounds on your backside. Wound Treatment Wound #2 - Lower Leg Wound Laterality: Left, Circumferential Cleanser: Soap and Water 3 x Per Week/30 Days Discharge Instructions: Gently cleanse wound with antibacterial soap, rinse and pat dry prior to dressing wounds Topical: Keystone gel 3 x Per Week/30 Days Prim Dressing: SILVERCEL Antimicrobial Alginate Dressing, 1x12 (in/in) ary 3 x Per Week/30 Days Secondary Dressing: ABD Pad 5x9 (in/in) 3 x Per Week/30 Days Discharge Instructions: Cover with ABD pad Secured With: Tubigrip Size G, 4.5x10 (in/yd) 3 x Per Week/30 Days Discharge Instructions: single layer Electronic Signature(s) Signed: 09/18/2022 8:47:01 AM By: Matthew Kelp PA-C Signed: 09/18/2022 11:43:38 AM By: [name] Entered By: [name] on 09/17/2022 16:07:57 -------------------------------------------------------------------------------- Problem List Details Patient Name: Date of Service: Matthew Macadam J. 09/17/2022 2:45 PM Medical Record Number: [id number] Patient Account Number: [id number] [name], [name] ([id number]) [id number] Page 4 of 7 Date of Birth/Sex: Treating RN: 1955/11/29 (67 y.o. [name], [name] Primary Care Provider: Other Clinician: Ludger Nutting, Karoline Caldwell Referring Provider: Treating Provider/Extender: [name] in Treatment: 9 Active Problems ICD-10 Encounter Code Description Active Date MDM Diagnosis E11.622 Type 2 diabetes mellitus with other skin ulcer 07/16/2022 No Yes I89.0 Lymphedema, not elsewhere classified 07/16/2022 No Yes L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2/06/2022 No Yes L97.812 Non-pressure chronic ulcer of other part of right lower leg with fat layer 07/16/2022 No Yes exposed I10 Essential (primary) hypertension 07/16/2022 No Yes I50.42 Chronic combined systolic (congestive) and diastolic  (congestive) heart failure 07/16/2022 No Yes I49.9 Cardiac arrhythmia, unspecified 07/16/2022 No Yes Inactive Problems Resolved Problems Electronic Signature(s) Signed: 09/17/2022 2:43:51 PM By: Matthew Kelp PA-C Entered By: [name] on 09/17/2022 14:43:51 -------------------------------------------------------------------------------- Progress Note Details Patient Name: Date of Service: Matthew Macadam J. 09/17/2022 2:45 PM Medical Record Number: [id number] Patient Account Number: [id number] Date of Birth/Sex: Treating RN: Oct 29, 1955 (67 y.o. [name] Primary Care Provider: [name] Other Clinician: Betha [name] Referring Provider: Treating Provider/Extender: [name] in Treatment: 9 Subjective Chief Complaint Information obtained from Patient Bilateral LE ulcers and lymphedema History of Present Illness (HPI) 07-16-2022 upon evaluation today patient appears to be  doing poorly currently in regard to his his legs. The left leg is actually worse than the right. This is a patient that is previously been seen in the [place] office and at that point was doing really quite well with compression wrapping and often alginate are either Hydrofera Blue dressings. Fortunately he has gone since November 2020 until just current with out having any issue or need for wound care services which is great news. Unfortunately he is here today because he is having some issues at this point. Patient does have a history of several medical conditions which include diabetes mellitus type 2, lymphedema, hypertension, congestive heart failure, and cardiac arrhythmia though is not sure that this is actually atrial fibrillation based on what he has been told. He does have lymphedema pumps but tells me he has not used them since the summer when he had to move to a remodel of his building and subsequently never unpacked them. [name], [name] ([id number])  [id number] Page 5 of 7 07-23-2022 upon evaluation today patient appears to be doing well with regard to his legs. He is going require some sharp debridement today but overall seems to be making some progress. I am pleased in that regard he has much less drainage that he had did last week I think the compression wraps are definitely helping. 07-30-2022 upon evaluation today patient appears to be doing poorly in regard to his legs he actually has blue-green drainage which I think is consistent with Pseudomonas. I believe that we need to address this as soon as possible and I discussed that with the patient today as well. Fortunately I do not see any signs of active infection locally nor systemically at this time which is great news. No fevers, chills, nausea, vomiting, or diarrhea. 08-13-2022 upon evaluation today patient appears to be doing well currently in regard to his leg ulcers which are actually looking much better. Fortunately there does not appear to be any signs of active infection at this time which is great news. No fevers, chills, nausea, vomiting, or diarrhea. 3/7; patient with predominantly a left medial lower extremity wound as well as several smaller areas and a small area on the right lateral. His [name] came in today. We are using silver alginate as the primary dressing 09-03-2022 upon evaluation today patient appears to be doing well currently in regard to his wounds. He has been tolerating the dressing changes without complication. Fortunately there does not appear to be any signs of infection there is needed however for sharp debridement. 09-10-2022 upon evaluation today patient appears to be doing well currently in regard to his wound. Has been tolerating the dressing changes without complication. Fortunately there does not appear to be any signs of infection looking systemically which is great news and overall I am extremely pleased with where we stand  today. I do not see any signs of infection. 09-17-2022 upon evaluation today patient actually is making signs of improvement the good news is he is making good improvement. With that being said he still is having quite a time keeping the swelling down. We have been using Tubigrip I think that still probably our best option but nonetheless I think that we are making progress with regard to his wounds. Objective Constitutional Well-nourished and well-hydrated in no acute distress. Vitals Time Taken: 3:15 PM, Height: 73 in, Weight: 358 lbs, BMI: 47.2, Temperature: 97.7 F, Pulse: 70 bpm, Respiratory Rate: 18 breaths/min, Blood Pressure: 136/83 mmHg. Respiratory normal breathing without difficulty. Psychiatric this  patient is able to make decisions and demonstrates good insight into disease process. Alert and Oriented x 3. pleasant and cooperative. General Notes: I did have to perform sharp debridement today to clearway some necrotic debris he tolerated the debridement today without complication and postdebridement. The good news is the wound is significantly improved all locations postdebridement. Integumentary (Hair, Skin) Wound #1 status is Healed - Epithelialized. Original cause of wound was Gradually Appeared. The date acquired was: 06/15/2022. The wound has been in treatment 9 weeks. The wound is located on the Right,Anterior Lower Leg. The wound measures 0cm length x 0cm width x 0cm depth; 0cm^2 area and 0cm^3 volume. There is Fat Layer (Subcutaneous Tissue) exposed. There is a none present amount of drainage noted. There is no granulation within the wound bed. There is no necrotic tissue within the wound bed. Wound #2 status is Open. Original cause of wound was Gradually Appeared. The date acquired was: 06/15/2022. The wound has been in treatment 9 weeks. The wound is located on the Left,Circumferential Lower Leg. The wound measures 8cm length x 25.5cm width x 0.3cm depth; 160.221cm^2 area and  48.066cm^3 volume. There is Fat Layer (Subcutaneous Tissue) exposed. There is a medium amount of serosanguineous drainage noted. There is medium (34-66%) red granulation within the wound bed. There is a medium (34-66%) amount of necrotic tissue within the wound bed. Assessment Active Problems ICD-10 Type 2 diabetes mellitus with other skin ulcer Lymphedema, not elsewhere classified Non-pressure chronic ulcer of other part of left lower leg with fat layer exposed Non-pressure chronic ulcer of other part of right lower leg with fat layer exposed Essential (primary) hypertension Chronic combined systolic (congestive) and diastolic (congestive) heart failure Cardiac arrhythmia, unspecified Procedures Wound #2 [name], [name] ([id number]) [id number] Page 6 of 7 Pre-procedure diagnosis of Wound #2 is a Diabetic Wound/Ulcer of the Lower Extremity located on the Left,Circumferential Lower Leg .Severity of Tissue Pre Debridement is: Fat layer exposed. There was a Excisional Skin/Subcutaneous Tissue Debridement with a total area of 15 sq cm performed by [name]., PA-C. With the following instrument(s): Curette to remove Viable and Non-Viable tissue/material. Material removed includes Subcutaneous Tissue and Slough and. A time out was conducted at 16:00, prior to the start of the procedure. A Minimum amount of bleeding was controlled with Pressure. The procedure was tolerated well. Post Debridement Measurements: 8cm length x 25.5cm width x 0.3cm depth; 48.066cm^3 volume. Character of Wound/Ulcer Post Debridement is stable. Severity of Tissue Post Debridement is: Fat layer exposed. Post procedure Diagnosis Wound #2: Same as Pre-Procedure Plan Follow-up Appointments: Return Appointment in 1 week. Nurse Visit as needed Bathing/ Shower/ Hygiene: May shower with wound dressing protected with water repellent cover or cast protector. Anesthetic (Use 'Patient Medications'  Section for Anesthetic Order Entry): Lidocaine applied to wound bed Edema Control - Lymphedema / Segmental Compressive Device / Other: Elevate, Exercise Daily and Avoid Standing for Long Periods of Time. Elevate legs to the level of the heart and pump ankles as often as possible Elevate leg(s) parallel to the floor when sitting. Compression Pump: Use compression pump on left lower extremity for 60 minutes, twice daily. Compression Pump: Use compression pump on right lower extremity for 60 minutes, twice daily. DO YOUR BEST to sleep in the bed at night. DO NOT sleep in your recliner. Long hours of sitting in a recliner leads to swelling of the legs and/or potential wounds on your backside. WOUND #2: - Lower Leg Wound Laterality: Left, Circumferential Cleanser: Soap  and Water 3 x Per Week/30 Days Discharge Instructions: Gently cleanse wound with antibacterial soap, rinse and pat dry prior to dressing wounds Topical: Keystone gel 3 x Per Week/30 Days Prim Dressing: SILVERCEL Antimicrobial Alginate Dressing, 1x12 (in/in) 3 x Per Week/30 Days ary Secondary Dressing: ABD Pad 5x9 (in/in) 3 x Per Week/30 Days Discharge Instructions: Cover with ABD pad Secured With: Tubigrip Size G, 4.5x10 (in/yd) 3 x Per Week/30 Days Discharge Instructions: single layer 1. I would recommend currently that we have the patient continue to monitor for any signs of infection or worsening. Based on what I am seeing I do believe that he is making good progress. 2. Also can recommend we continue with the silver cell which I think is doing well. 3. I am also going to suggest patient should continue to use the Tubigrip size G which I think is doing well. Number form also can recommend he should continue to elevate his legs much as possible. We will see patient back for reevaluation in 1 week here in the clinic. If anything worsens or changes patient will contact our office for additional recommendations. Electronic  Signature(s) Signed: 09/17/2022 5:57:06 PM By: Matthew Kelp PA-C Entered By: [name] on 09/17/2022 17:57:06 -------------------------------------------------------------------------------- SuperBill Details Patient Name: Date of Service: [name], Matthew [name] 09/17/2022 Medical Record Number: [id number] Patient Account Number: [id number] Date of Birth/Sex: Treating RN: [birth year] (67 y.o. [name], [name] Primary Care Provider: [name] Other Clinician: Betha [name] Referring Provider: Treating Provider/Extender: [name] in Treatment: 9 Diagnosis Coding ICD-10 Codes Code Description E11.622 Type 2 diabetes mellitus with other skin ulcer I89.0 Lymphedema, not elsewhere classified L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 L97.812 Non-pressure chronic ulcer of other part of right lower leg with fat layer exposed I10 Essential (primary) hypertension I50.42 Chronic combined systolic (congestive) and diastolic (congestive) heart failure KIMBERLEY, COHEE ([id number]) [id number] Page 7 of 7 I49.9 Cardiac arrhythmia, unspecified Facility Procedures : CPT4 Code: [id number] Description: 11042 - DEB SUBQ TISSUE 20 SQ CM/< ICD-10 Diagnosis Description L97.822 Non-pressure chronic ulcer of other part of left lower leg with fat layer expo Modifier: sed Quantity: 1 Physician Procedures : CPT4 Code Description Modifier [id number] 11042 - WC PHYS SUBQ TISS 20 SQ CM ICD-10 Diagnosis Description L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 Quantity: 1 Electronic Signature(s) Signed: 09/17/2022 5:57:25 PM By: Matthew Kelp PA-C Entered By: [name] on 09/17/2022 17:57:25

## 2022-09-24 ENCOUNTER — Encounter: Payer: PPO | Admitting: Physician Assistant

## 2022-09-24 DIAGNOSIS — E11622 Type 2 diabetes mellitus with other skin ulcer: Secondary | ICD-10-CM | POA: Diagnosis not present

## 2022-09-24 DIAGNOSIS — L97822 Non-pressure chronic ulcer of other part of left lower leg with fat layer exposed: Secondary | ICD-10-CM | POA: Diagnosis not present

## 2022-09-24 DIAGNOSIS — I890 Lymphedema, not elsewhere classified: Secondary | ICD-10-CM | POA: Diagnosis not present

## 2022-09-24 NOTE — Progress Notes (Signed)
ALLISTER, LESSLEY ([id number]) [id number] Page 1 of 6 Visit Report for 09/24/2022 Arrival Information Details Patient Name: Date of Service: [name] 09/24/2022 3:15 PM Medical Record Number: [id number] Patient Account Number: [id number] Date of Birth/Sex: Treating RN: 1955/11/28 (66 y.o. [name]) Yevonne Pax Primary Care Curties Conigliaro: [name] Other Clinician: Referring Yazeed Pryer: Treating Toneka Fullen/Extender: Matthew Folks in Treatment: 10 Visit Information History Since Last Visit Added or deleted any medications: No Patient Arrived: [name] Any new allergies or adverse reactions: No Arrival Time: 15:22 Had a fall or experienced change in No Accompanied By: caregiver activities of daily living that may affect Transfer Assistance: None risk of falls: Patient Identification Verified: Yes Signs or symptoms of abuse/neglect since last visito No Secondary Verification Process Completed: Yes Hospitalized since last visit: No Patient Requires Transmission-Based Precautions: No Implantable device outside of the clinic excluding No Patient Has Alerts: No cellular tissue based products placed in the center since last visit: Has Dressing in Place as Prescribed: Yes Has Compression in Place as Prescribed: Yes Pain Present Now: No Electronic Signature(s) Signed: 09/24/2022 4:28:11 PM By: Yevonne Pax RN Entered By: Yevonne Pax on 09/24/2022 15:25:11 -------------------------------------------------------------------------------- Clinic Level of Care Assessment Details Patient Name: Date of Service: [name] 09/24/2022 3:15 PM Medical Record Number: [id number] Patient Account Number: [id number] Date of Birth/Sex: Treating RN: 1956/06/08 (66 y.o. [name]) Yevonne Pax Primary Care Kaydyn Chism: [name] Other Clinician: Referring Shanara Schnieders: Treating Zabrina Brotherton/Extender: Matthew Folks in Treatment: 10 Clinic Level of  Care Assessment Items TOOL 1 Quantity Score  - 0 Use when EandM and Procedure is performed on INITIAL visit ASSESSMENTS - Nursing Assessment / Reassessment  - 0 General Physical Exam (combine w/ comprehensive assessment (listed just below) when performed on new pt. evals)  - 0 Comprehensive Assessment (HX, ROS, Risk Assessments, Wounds Hx, etc.) ASSESSMENTS - Wound and Skin Assessment / Reassessment  - 0 Dermatologic / Skin Assessment (not related to wound area) ASSESSMENTS - Ostomy and/or Continence Assessment and Care  - 0 Incontinence Assessment and Management  - 0 Ostomy Care Assessment and Management (repouching, etc.) PROCESS - Coordination of Care  - 0 Simple Patient / Family Education for ongoing care  - 0 Complex (extensive) Patient / Family Education for ongoing care  - 0 Staff obtains Chiropractor, Records, T Results / Process Orders est  - 0 Staff telephones HHA, Nursing Homes / Clarify orders / etc  - 0 Routine Transfer to another Facility (non-emergent condition)  - 0 Routine Hospital Admission (non-emergent condition) TYJON, BOWEN ([id number]) [id number] Page 2 of 6  - 0 New Admissions / Manufacturing engineer / Ordering NPWT Apligraf, etc. ,  - 0 Emergency Hospital Admission (emergent condition) PROCESS - Special Needs  - 0 Pediatric / Minor Patient Management  - 0 Isolation Patient Management  - 0 Hearing / Language / Visual special needs  - 0 Assessment of Community assistance (transportation, D/C planning, etc.)  - 0 Additional assistance / Altered mentation  - 0 Support Surface(s) Assessment (bed, cushion, seat, etc.) INTERVENTIONS - Miscellaneous  - 0 External ear exam  - 0 Patient Transfer (multiple staff / Nurse, adult / Similar devices)  - 0 Simple Staple / Suture removal (25 or less)  - 0 Complex Staple / Suture removal (26 or more)  - 0 Hypo/Hyperglycemic  Management (do not check if billed separately)  - 0 Ankle / Brachial Index (ABI) - do not check if billed separately Has the patient  been seen at the hospital within the last three years: Yes Total Score: 0 Level Of Care: ____ Electronic Signature(s) Signed: 09/24/2022 4:28:11 PM By: Yevonne Pax RN Entered By: Yevonne Pax on 09/24/2022 15:56:16 -------------------------------------------------------------------------------- Encounter Discharge Information Details Patient Name: Date of Service: Matthew Burly RLES [name]. 09/24/2022 3:15 PM Medical Record Number: [id number] Patient Account Number: [id number] Date of Birth/Sex: Treating RN: 03/06/1956 (66 y.o. [name] Primary Care Wafaa Deemer: [name] Other Clinician: Referring [place] Crance: Treating Aashrith Eves/Extender: Matthew Folks in Treatment: 10 Encounter Discharge Information Items Post Procedure Vitals Discharge Condition: Stable Temperature (F): 98 Ambulatory Status: Ambulatory Pulse (bpm): 79 Discharge Destination: Home Respiratory Rate (breaths/min): 18 Transportation: Private Auto Blood Pressure (mmHg): 141/87 Accompanied By: wife Schedule Follow-up Appointment: Yes Clinical Summary of Care: Electronic Signature(s) Signed: 09/24/2022 4:28:11 PM By: Yevonne Pax RN Entered By: Yevonne Pax on 09/24/2022 15:57:25 -------------------------------------------------------------------------------- Lower Extremity Assessment Details Patient Name: Date of Service: [name] 09/24/2022 3:15 PM Medical Record Number: [id number] Patient Account Number: [id number] Date of Birth/Sex: Treating RN: 12/18/1955 (66 y.o. [name] Primary Care Nylia Gavina: [name] Other Clinician: Referring Saphyra Hutt: Treating Jaslene Marsteller/Extender: Matthew Folks in Treatment: 10 Edema Assessment G[Left: [name] [phone] [Right: [id number] Page 3 of  6] Assessed: [Left: No] [Right: No] Edema: [Left: Ye] [Right: s] Calf Left: Right: Point of Measurement: 35 cm From Medial Instep 59 cm Ankle Left: Right: Point of Measurement: 10 cm From Medial Instep 33 cm Vascular Assessment Pulses: Dorsalis Pedis Palpable: [Left:Yes] Electronic Signature(s) Signed: 09/24/2022 4:28:11 PM By: Yevonne Pax RN Entered By: Yevonne Pax on 09/24/2022 15:35:02 -------------------------------------------------------------------------------- Multi Wound Chart Details Patient Name: Date of Service: Matthew Burly RLES [name]. 09/24/2022 3:15 PM Medical Record Number: [id number] Patient Account Number: [id number] Date of Birth/Sex: Treating RN: 02-15-1956 (66 y.o. [name] Primary Care Elenora Hawbaker: [name] Other Clinician: Referring Cejay Cambre: Treating Peggyann Zwiefelhofer/Extender: Matthew Folks in Treatment: 10 Vital Signs Height(in): 73 Pulse(bpm): 79 Weight(lbs): 358 Blood Pressure(mmHg): 141/87 Body Mass Index(BMI): 47.2 Temperature(F): 98 Respiratory Rate(breaths/min): 18 [Treatment Notes:Wound Assessments Treatment Notes] Electronic Signature(s) Signed: 09/24/2022 4:28:11 PM By: Yevonne Pax RN Entered By: Yevonne Pax on 09/24/2022 15:28:02 -------------------------------------------------------------------------------- Multi-Disciplinary Care Plan Details Patient Name: Date of Service: Matthew Burly RLES [name]. 09/24/2022 3:15 PM Medical Record Number: [id number] Patient Account Number: [id number] Date of Birth/Sex: Treating RN: December 30, 1955 (66 y.o. [name] Primary Care Corinn Stoltzfus: [name] Other Clinician: Referring Mehkai Gallo: Treating Nakkia Mackiewicz/Extender: Matthew Folks in Treatment: 10 Active Inactive Wound/Skin Impairment Nursing Diagnoses: Knowledge deficit related to ulceration/compromised skin integrity Goals: Patient/caregiver will verbalize understanding of skin care regimen Date Initiated:  07/16/2022 Target Resolution Date: 10/24/2022 WOODSON, KASDORF ([id number]) [phone].pdf Page 4 of 6 Goal Status: Active Ulcer/skin breakdown will have a volume reduction of 30% by week 4 Date Initiated: 07/16/2022 Date Inactivated: 09/10/2022 Target Resolution Date: 08/14/2022 Goal Status: Unmet Unmet Reason: comorbidties Ulcer/skin breakdown will have a volume reduction of 50% by week 8 Date Initiated: 07/16/2022 Date Inactivated: 09/24/2022 Target Resolution Date: 09/14/2022 Goal Status: Unmet Unmet Reason: comorbidities Ulcer/skin breakdown will have a volume reduction of 80% by week 12 Date Initiated: 07/16/2022 Target Resolution Date: 10/14/2022 Goal Status: Active Ulcer/skin breakdown will heal within 14 weeks Date Initiated: 07/16/2022 Target Resolution Date: 11/14/2022 Goal Status: Active Interventions: Assess patient/caregiver ability to obtain necessary supplies Assess patient/caregiver ability to perform ulcer/skin care regimen upon admission and as needed Assess ulceration(s) every visit Notes: Electronic  Signature(s) Signed: 09/24/2022 4:28:11 PM By: Yevonne PaxEpps, Carrie RN Entered By: Yevonne PaxEpps, Carrie on 09/24/2022 15:28:33 -------------------------------------------------------------------------------- Pain Assessment Details Patient Name: Date of Service: Matthew FrancoGA [name], Matthew RLES [name]. 09/24/2022 3:15 PM Medical Record Number: [id number] Patient Account Number: [id number] Date of Birth/Sex: Treating RN: 12/29/1955 (66 y.o. Judie PetitM) Yevonne PaxEpps, Carrie Primary Care Rachelanne Whidby: Renford DillsPolite, Ronald Other Clinician: Referring Tyjai Matuszak: Treating Tydus Sanmiguel/Extender: Matthew FolksStone, Hoyt Polite, Ronald Weeks in Treatment: 10 Active Problems Location of Pain Severity and Description of Pain Patient Has Paino No Site Locations Pain Management and Medication Current Pain Management: Electronic Signature(s) Signed: 09/24/2022 4:28:11 PM By: Yevonne PaxEpps, Carrie RN Entered By: Yevonne PaxEpps, Carrie on 09/24/2022  15:27:23 Patient/Caregiver Education Details -------------------------------------------------------------------------------- [name], [name] ([id number]) [id number] Page 5 of 6 Patient Name: Date of Service: Matthew FrancoGA [name], Matthew RLES [name]. 4/11/2024andnbsp3:15 PM Medical Record Number: [id number] Patient Account Number: [id number] Date of Birth/Gender: Treating RN: 02/03/1956 (66 y.o. Judie PetitM) Yevonne PaxEpps, Carrie Primary Care Physician: Renford DillsPolite, Ronald Other Clinician: Referring Physician: Treating Physician/Extender: Matthew FolksStone, Hoyt Polite, Ronald Weeks in Treatment: 10 Education Assessment Education Provided To: Patient Education Topics Provided Wound/Skin Impairment: Methods: Explain/Verbal Responses: State content correctly Matthew CompanyElectronic Signature(s) Signed: 09/24/2022 4:28:11 PM By: Yevonne PaxEpps, Carrie RN Entered By: Yevonne PaxEpps, Carrie on 09/24/2022 15:28:43 -------------------------------------------------------------------------------- Wound Assessment Details Patient Name: Date of Service: Matthew BurlyGA [name], Matthew RLES [name]. 09/24/2022 3:15 PM Medical Record Number: [id number] Patient Account Number: [id number] Date of Birth/Sex: Treating RN: 12/28/1955 (66 y.o. Judie PetitM) Yevonne PaxEpps, Carrie Primary Care Dequante Tremaine: Renford DillsPolite, Ronald Other Clinician: Referring Evelise Reine: Treating Aldene Hendon/Extender: Matthew FolksStone, Hoyt Polite, Ronald Weeks in Treatment: 10 Wound Status Wound Number: 2 Primary Diabetic Wound/Ulcer of the Lower Extremity Etiology: Wound Location: Left, Circumferential Lower Leg Wound Status: Open Wounding Event: Gradually Appeared Comorbid Arrhythmia, Congestive Heart Failure, Hypertension, Type II Date Acquired: 06/15/2022 History: Diabetes Weeks Of Treatment: 10 Clustered Wound: No Photos Wound Measurements Length: (cm) 8 Width: (cm) 25 Depth: (cm) 0.3 Area: (cm) 157.08 Volume: (cm) 47.124 % Reduction in Area: 73.9% % Reduction in Volume: 21.6% Epithelialization: None Tunneling: No Undermining:  No Wound Description Classification: Grade 1 Exudate Amount: Medium Exudate Type: Serosanguineous Exudate Color: red, brown Foul Odor After Cleansing: No Slough/Fibrino Yes Wound Bed Granulation Amount: Medium (34-66%) Exposed Structure [name], [name] ([id number]) [id number] Page 6 of 6 Granulation Quality: Red Fascia Exposed: No Necrotic Amount: Medium (34-66%) Fat Layer (Subcutaneous Tissue) Exposed: Yes Tendon Exposed: No Muscle Exposed: No Joint Exposed: No Bone Exposed: No Treatment Notes Wound #2 (Lower Leg) Wound Laterality: Left, Circumferential Cleanser Soap and Water Discharge Instruction: Gently cleanse wound with antibacterial soap, rinse and pat dry prior to dressing wounds Peri-Wound Care Topical Keystone gel Primary Dressing SILVERCEL Antimicrobial Alginate Dressing, 1x12 (in/in) Secondary Dressing ABD Pad 5x9 (in/in) Discharge Instruction: Cover with ABD pad Secured With Tubigrip Size G, 4.5x10 (in/yd) Discharge Instruction: single layer Compression Wrap Compression Stockings Add-Ons Electronic Signature(s) Signed: 09/24/2022 4:28:11 PM By: Yevonne PaxEpps, Carrie RN Entered By: Yevonne PaxEpps, Carrie on 09/24/2022 15:34:49 -------------------------------------------------------------------------------- Vitals Details Patient Name: Date of Service: Matthew BurlyGA [name], Matthew RLES [name]. 09/24/2022 3:15 PM Medical Record Number: [id number] Patient Account Number: [id number] Date of Birth/Sex: Treating RN: 11/19/1955 (66 y.o. Judie PetitM) Yevonne PaxEpps, Carrie Primary Care Matylda Fehring: Renford DillsPolite, Ronald Other Clinician: Referring Makel Mcmann: Treating Billiejo Sorto/Extender: Matthew FolksStone, Hoyt Polite, Ronald Weeks in Treatment: 10 Vital Signs Time Taken: 15:25 Temperature (F): 98 Height (in): 73 Pulse (bpm): 79 Weight (lbs): 358 Respiratory Rate (breaths/min): 18 Body Mass Index (BMI): 47.2 Blood Pressure (mmHg): 141/87 Reference Range: 80 - 120 mg / dl Electronic Signature(s) Signed:  09/24/2022 4:28:11 PM By:  Yevonne Pax RN Entered By: Yevonne Pax on 09/24/2022 15:25:27

## 2022-09-24 NOTE — Progress Notes (Addendum)
JAKIAH, GOREE ([id number]) [id number] Page 1 of 7 Visit Report for 09/24/2022 Chief Complaint Document Details Patient Name: Date of Service: [name] 09/24/2022 3:15 PM Medical Record Number: [id number] Patient Account Number: [id number] Date of Birth/Sex: Treating RN: Apr 10, 1956 (67 y.o. [name]) Yevonne Pax Primary Care Provider: [name] Other Clinician: Referring Provider: Treating Provider/Extender: Matthew Folks in Treatment: 10 Information Obtained from: Patient Chief Complaint Bilateral LE ulcers and lymphedema Electronic Signature(s) Signed: 09/24/2022 3:34:16 PM By: Allen Derry PA-C Entered By: Allen Derry on 09/24/2022 15:34:16 -------------------------------------------------------------------------------- Debridement Details Patient Name: Date of Service: [name] 09/24/2022 3:15 PM Medical Record Number: [id number] Patient Account Number: [id number] Date of Birth/Sex: Treating RN: Jul 13, 1955 (67 y.o. [name]) Yevonne Pax Primary Care Provider: [name] Other Clinician: Referring Provider: Treating Provider/Extender: Matthew Folks in Treatment: 10 Debridement Performed for Assessment: Wound #2 Left,Circumferential Lower Leg Performed By: Physician Allen Derry, PA-C Debridement Type: Debridement Severity of Tissue Pre Debridement: Fat layer exposed Level of Consciousness (Pre-procedure): Awake and Alert Pre-procedure Verification/Time Out Yes - 15:53 Taken: Start Time: 15:53 T Area Debrided (L x W): otal 4 (cm) x 4 (cm) = 16 (cm) Tissue and other material debrided: Viable, Non-Viable, Slough, Subcutaneous, Slough Level: Skin/Subcutaneous Tissue Debridement Description: Excisional Instrument: Curette Bleeding: Minimum Hemostasis Achieved: Pressure End Time: 15:57 Procedural Pain: 0 Post Procedural Pain: 0 Response to Treatment: Procedure was tolerated well Level of  Consciousness (Post- Awake and Alert procedure): Post Debridement Measurements of Total Wound Length: (cm) 8 Width: (cm) 25 Depth: (cm) 0.3 Volume: (cm) 47.124 Character of Wound/Ulcer Post Debridement: Improved Severity of Tissue Post Debridement: Fat layer exposed Post Procedure Diagnosis Same as Pre-procedure Electronic Signature(s) Signed: 09/24/2022 4:28:11 PM By: Yevonne Pax RN Signed: 09/24/2022 10:26:23 PM By: Allen Derry PA-C Entered By: Yevonne Pax on 09/24/2022 15:55:50 [name] ([id number]) [id number] Page 2 of 7 -------------------------------------------------------------------------------- HPI Details Patient Name: Date of Service: [name] 09/24/2022 3:15 PM Medical Record Number: [id number] Patient Account Number: [id number] Date of Birth/Sex: Treating RN: August 19, 1955 (67 y.o. [name]) Yevonne Pax Primary Care Provider: [name] Other Clinician: Referring Provider: Treating Provider/Extender: Matthew Folks in Treatment: 10 History of Present Illness HPI Description: 07-16-2022 upon evaluation today patient appears to be doing poorly currently in regard to his his legs. The left leg is actually worse than the right. This is a patient that is previously been seen in the [place] office and at that point was doing really quite well with compression wrapping and often alginate are either Hydrofera Blue dressings. Fortunately he has gone since November 2020 until just current with out having any issue or need for wound care services which is great news. Unfortunately he is here today because he is having some issues at this point. Patient does have a history of several medical conditions which include diabetes mellitus type 2, lymphedema, hypertension, congestive heart failure, and cardiac arrhythmia though is not sure that this is actually atrial fibrillation based on what he has been told. He does have  lymphedema pumps but tells me he has not used them since the summer when he had to move to a remodel of his building and subsequently never unpacked them. 07-23-2022 upon evaluation today patient appears to be doing well with regard to his legs. He is going require some sharp debridement today but overall seems to be making some progress. I am pleased in that regard he has much  less drainage that he had did last week I think the compression wraps are definitely helping. 07-30-2022 upon evaluation today patient appears to be doing poorly in regard to his legs he actually has blue-green drainage which I think is consistent with Pseudomonas. I believe that we need to address this as soon as possible and I discussed that with the patient today as well. Fortunately I do not see any signs of active infection locally nor systemically at this time which is great news. No fevers, chills, nausea, vomiting, or diarrhea. 08-13-2022 upon evaluation today patient appears to be doing well currently in regard to his leg ulcers which are actually looking much better. Fortunately there does not appear to be any signs of active infection at this time which is great news. No fevers, chills, nausea, vomiting, or diarrhea. 3/7; patient with predominantly a left medial lower extremity wound as well as several smaller areas and a small area on the right lateral. His Jodie Echevaria came in today. We are using silver alginate as the primary dressing 09-03-2022 upon evaluation today patient appears to be doing well currently in regard to his wounds. He has been tolerating the dressing changes without complication. Fortunately there does not appear to be any signs of infection there is needed however for sharp debridement. 09-10-2022 upon evaluation today patient appears to be doing well currently in regard to his wound. Has been tolerating the dressing changes without complication. Fortunately there does not appear to be any signs of  infection looking systemically which is great news and overall I am extremely pleased with where we stand today. I do not see any signs of infection. 09-17-2022 upon evaluation today patient actually is making signs of improvement the good news is he is making good improvement. With that being said he still is having quite a time keeping the swelling down. We have been using Tubigrip I think that still probably our best option but nonetheless I think that we are making progress with regard to his wounds. 09-24-2022 upon evaluation today patient appears to be doing well currently in regard to his wounds is continuing to make improvements at this point which is great news and overall I am extremely pleased with where we stand today. Fortunately I do not see any evidence of active infection at this time which is great news and in general I think he is doing quite well with the [facility] and silver alginate. Electronic Signature(s) Signed: 09/24/2022 4:26:26 PM By: Allen Derry PA-C Entered By: Allen Derry on 09/24/2022 16:26:26 -------------------------------------------------------------------------------- Physical Exam Details Patient Name: Date of Service: [name], [name] 09/24/2022 3:15 PM Medical Record Number: [id number] Patient Account Number: [id number] Date of Birth/Sex: Treating RN: 18-Sep-1955 (66 y.o. [name] Primary Care Provider: [name] Other Clinician: Referring Provider: Treating Provider/Extender: Matthew Folks in Treatment: 10 Constitutional Well-nourished and well-hydrated in no acute distress. Respiratory normal breathing without difficulty. Psychiatric this patient is able to make decisions and demonstrates good insight into disease process. Alert and Oriented x 3. pleasant and cooperative. Notes Upon inspection patient's wound bed actually showed signs of good granulation epithelization at this point. Fortunately I do not see any signs of  infection SHER, HELLINGER ([id number]) [id number] Page 3 of 7 locally or systemically which is great news and overall I do believe that we are moving in the right direction here. Electronic Signature(s) Signed: 09/24/2022 4:26:51 PM By: Allen Derry PA-C Entered By: Allen Derry on 09/24/2022 16:26:51 -------------------------------------------------------------------------------- Physician  Orders Details Patient Name: Date of Service: [name] 09/24/2022 3:15 PM Medical Record Number: [id number] Patient Account Number: [id number] Date of Birth/Sex: Treating RN: 30-Mar-1956 (66 y.o. [name]) Yevonne Pax Primary Care Provider: [name] Other Clinician: Referring Provider: Treating Provider/Extender: Matthew Folks in Treatment: 10 Verbal / Phone Orders: No Diagnosis Coding ICD-10 Coding Code Description E11.622 Type 2 diabetes mellitus with other skin ulcer I89.0 Lymphedema, not elsewhere classified L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 L97.812 Non-pressure chronic ulcer of other part of right lower leg with fat layer exposed I10 Essential (primary) hypertension I50.42 Chronic combined systolic (congestive) and diastolic (congestive) heart failure I49.9 Cardiac arrhythmia, unspecified Follow-up Appointments Return Appointment in 1 week. Nurse Visit as needed Bathing/ Shower/ Hygiene May shower with wound dressing protected with water repellent cover or cast protector. Anesthetic (Use 'Patient Medications' Section for Anesthetic Order Entry) Lidocaine applied to wound bed Edema Control - Lymphedema / Segmental Compressive Device / Other Elevate, Exercise Daily and A void Standing for Long Periods of Time. Elevate legs to the level of the heart and pump ankles as often as possible Elevate leg(s) parallel to the floor when sitting. Compression Pump: Use compression pump on left  lower extremity for 60 minutes, twice daily. Compression Pump: Use compression pump on right lower extremity for 60 minutes, twice daily. DO YOUR BEST to sleep in the bed at night. DO NOT sleep in your recliner. Long hours of sitting in a recliner leads to swelling of the legs and/or potential wounds on your backside. Wound Treatment Wound #2 - Lower Leg Wound Laterality: Left, Circumferential Cleanser: Soap and Water 3 x Per Week/30 Days Discharge Instructions: Gently cleanse wound with antibacterial soap, rinse and pat dry prior to dressing wounds Topical: Keystone gel 3 x Per Week/30 Days Prim Dressing: SILVERCEL Antimicrobial Alginate Dressing, 1x12 (in/in) ary 3 x Per Week/30 Days Secondary Dressing: ABD Pad 5x9 (in/in) 3 x Per Week/30 Days Discharge Instructions: Cover with ABD pad Secured With: Tubigrip Size G, 4.5x10 (in/yd) 3 x Per Week/30 Days Discharge Instructions: single layer Electronic Signature(s) Signed: 09/24/2022 4:28:11 PM By: Yevonne Pax RN Signed: 09/24/2022 10:26:23 PM By: Allen Derry PA-C Entered By: Yevonne Pax on 09/24/2022 15:52:39 [name] ([id number]) [id number] Page 4 of 7 -------------------------------------------------------------------------------- Problem List Details Patient Name: Date of Service: [name] 09/24/2022 3:15 PM Medical Record Number: [id number] Patient Account Number: [id number] Date of Birth/Sex: Treating RN: Jun 26, 1955 (66 y.o. [name]) Yevonne Pax Primary Care Provider: [name] Other Clinician: Referring Provider: Treating Provider/Extender: Matthew Folks in Treatment: 10 Active Problems ICD-10 Encounter Code Description Active Date MDM Diagnosis E11.622 Type 2 diabetes mellitus with other skin ulcer 07/16/2022 No Yes I89.0 Lymphedema, not elsewhere classified 07/16/2022 No Yes L97.822 Non-pressure chronic ulcer of other part of left lower leg with fat layer  exposed2/06/2022 No Yes L97.812 Non-pressure chronic ulcer of other part of right lower leg with fat layer 07/16/2022 No Yes exposed I10 Essential (primary) hypertension 07/16/2022 No Yes I50.42 Chronic combined systolic (congestive) and diastolic (congestive) heart failure 07/16/2022 No Yes I49.9 Cardiac arrhythmia, unspecified 07/16/2022 No Yes Inactive Problems Resolved Problems Electronic Signature(s) Signed: 09/24/2022 3:34:11 PM By: Allen Derry PA-C Entered By: Allen Derry on 09/24/2022 15:34:11 -------------------------------------------------------------------------------- Progress Note Details Patient Name: Date of Service: [name] 09/24/2022 3:15 PM Medical Record Number: [id number] Patient Account Number: [id number] Date of Birth/Sex: Treating RN:  01-14-1956 (66 y.o. [name] Primary Care Provider: [name] Other Clinician: Referring Provider: Treating Provider/Extender: Matthew Folks in Treatment: 10 Subjective Chief Complaint Information obtained from Patient Bilateral LE ulcers and lymphedema History of Present Illness (HPI) 07-16-2022 upon evaluation today patient appears to be doing poorly currently in regard to his his legs. The left leg is actually worse than the right. This is a patient that is previously been seen in the [place] office and at that point was doing really quite well with compression wrapping and often alginate are either Hydrofera Blue dressings. Fortunately he has gone since November 2020 until just current with out having any issue or need for wound care services RENWICK, ASMAN ([id number]) [id number] Page 5 of 7 which is great news. Unfortunately he is here today because he is having some issues at this point. Patient does have a history of several medical conditions which include diabetes mellitus type 2, lymphedema, hypertension, congestive heart failure, and cardiac arrhythmia  though is not sure that this is actually atrial fibrillation based on what he has been told. He does have lymphedema pumps but tells me he has not used them since the summer when he had to move to a remodel of his building and subsequently never unpacked them. 07-23-2022 upon evaluation today patient appears to be doing well with regard to his legs. He is going require some sharp debridement today but overall seems to be making some progress. I am pleased in that regard he has much less drainage that he had did last week I think the compression wraps are definitely helping. 07-30-2022 upon evaluation today patient appears to be doing poorly in regard to his legs he actually has blue-green drainage which I think is consistent with Pseudomonas. I believe that we need to address this as soon as possible and I discussed that with the patient today as well. Fortunately I do not see any signs of active infection locally nor systemically at this time which is great news. No fevers, chills, nausea, vomiting, or diarrhea. 08-13-2022 upon evaluation today patient appears to be doing well currently in regard to his leg ulcers which are actually looking much better. Fortunately there does not appear to be any signs of active infection at this time which is great news. No fevers, chills, nausea, vomiting, or diarrhea. 3/7; patient with predominantly a left medial lower extremity wound as well as several smaller areas and a small area on the right lateral. His Jodie Echevaria came in today. We are using silver alginate as the primary dressing 09-03-2022 upon evaluation today patient appears to be doing well currently in regard to his wounds. He has been tolerating the dressing changes without complication. Fortunately there does not appear to be any signs of infection there is needed however for sharp debridement. 09-10-2022 upon evaluation today patient appears to be doing well currently in regard to his wound. Has been  tolerating the dressing changes without complication. Fortunately there does not appear to be any signs of infection looking systemically which is great news and overall I am extremely pleased with where we stand today. I do not see any signs of infection. 09-17-2022 upon evaluation today patient actually is making signs of improvement the good news is he is making good improvement. With that being said he still is having quite a time keeping the swelling down. We have been using Tubigrip I think that still probably our best option but nonetheless I think that we are  making progress with regard to his wounds. 09-24-2022 upon evaluation today patient appears to be doing well currently in regard to his wounds is continuing to make improvements at this point which is great news and overall I am extremely pleased with where we stand today. Fortunately I do not see any evidence of active infection at this time which is great news and in general I think he is doing quite well with the [facility] and silver alginate. Objective Constitutional Well-nourished and well-hydrated in no acute distress. Vitals Time Taken: 3:25 PM, Height: 73 in, Weight: 358 lbs, BMI: 47.2, Temperature: 98 F, Pulse: 79 bpm, Respiratory Rate: 18 breaths/min, Blood Pressure: 141/87 mmHg. Respiratory normal breathing without difficulty. Psychiatric this patient is able to make decisions and demonstrates good insight into disease process. Alert and Oriented x 3. pleasant and cooperative. General Notes: Upon inspection patient's wound bed actually showed signs of good granulation epithelization at this point. Fortunately I do not see any signs of infection locally or systemically which is great news and overall I do believe that we are moving in the right direction here. Integumentary (Hair, Skin) Wound #2 status is Open. Original cause of wound was Gradually Appeared. The date acquired was: 06/15/2022. The wound has been in treatment 10  weeks. The wound is located on the Left,Circumferential Lower Leg. The wound measures 8cm length x 25cm width x 0.3cm depth; 157.08cm^2 area and 47.124cm^3 volume. There is Fat Layer (Subcutaneous Tissue) exposed. There is no tunneling or undermining noted. There is a medium amount of serosanguineous drainage noted. There is medium (34-66%) red granulation within the wound bed. There is a medium (34-66%) amount of necrotic tissue within the wound bed. Assessment Active Problems ICD-10 Type 2 diabetes mellitus with other skin ulcer Lymphedema, not elsewhere classified Non-pressure chronic ulcer of other part of left lower leg with fat layer exposed Non-pressure chronic ulcer of other part of right lower leg with fat layer exposed Essential (primary) hypertension Chronic combined systolic (congestive) and diastolic (congestive) heart failure Cardiac arrhythmia, unspecified DORNELL, GRASMICK ([id number]) [id number] Page 6 of 7 Procedures Wound #2 Pre-procedure diagnosis of Wound #2 is a Diabetic Wound/Ulcer of the Lower Extremity located on the Left,Circumferential Lower Leg .Severity of Tissue Pre Debridement is: Fat layer exposed. There was a Excisional Skin/Subcutaneous Tissue Debridement with a total area of 16 sq cm performed by Allen Derry, PA-C. With the following instrument(s): Curette to remove Viable and Non-Viable tissue/material. Material removed includes Subcutaneous Tissue and Slough and. No specimens were taken. A time out was conducted at 15:53, prior to the start of the procedure. A Minimum amount of bleeding was controlled with Pressure. The procedure was tolerated well with a pain level of 0 throughout and a pain level of 0 following the procedure. Post Debridement Measurements: 8cm length x 25cm width x 0.3cm depth; 47.124cm^3 volume. Character of Wound/Ulcer Post Debridement is improved. Severity of Tissue Post Debridement is: Fat layer  exposed. Post procedure Diagnosis Wound #2: Same as Pre-Procedure Plan Follow-up Appointments: Return Appointment in 1 week. Nurse Visit as needed Bathing/ Shower/ Hygiene: May shower with wound dressing protected with water repellent cover or cast protector. Anesthetic (Use 'Patient Medications' Section for Anesthetic Order Entry): Lidocaine applied to wound bed Edema Control - Lymphedema / Segmental Compressive Device / Other: Elevate, Exercise Daily and Avoid Standing for Long Periods of Time. Elevate legs to the level of the heart and pump ankles as often as possible Elevate leg(s) parallel to the floor when sitting.  Compression Pump: Use compression pump on left lower extremity for 60 minutes, twice daily. Compression Pump: Use compression pump on right lower extremity for 60 minutes, twice daily. DO YOUR BEST to sleep in the bed at night. DO NOT sleep in your recliner. Long hours of sitting in a recliner leads to swelling of the legs and/or potential wounds on your backside. WOUND #2: - Lower Leg Wound Laterality: Left, Circumferential Cleanser: Soap and Water 3 x Per Week/30 Days Discharge Instructions: Gently cleanse wound with antibacterial soap, rinse and pat dry prior to dressing wounds Topical: Keystone gel 3 x Per Week/30 Days Prim Dressing: SILVERCEL Antimicrobial Alginate Dressing, 1x12 (in/in) 3 x Per Week/30 Days ary Secondary Dressing: ABD Pad 5x9 (in/in) 3 x Per Week/30 Days Discharge Instructions: Cover with ABD pad Secured With: Tubigrip Size G, 4.5x10 (in/yd) 3 x Per Week/30 Days Discharge Instructions: single layer 1. I would recommend that we have the patient [name] followed by the silver alginate dressing which I think is still doing a really good job. 2. I am also can recommend the patient should continue to monitor for any signs of infection or worsening. Obviously based on what I am seeing I think that we are making  progress although it slow I think were still doing quite well. We will see patient back for reevaluation in 1 week here in the clinic. If anything worsens or changes patient will contact our office for additional recommendations. Electronic Signature(s) Signed: 09/24/2022 4:27:10 PM By: Allen DerryStone, Joshaua Epple PA-C Entered By: Allen DerryStone, Massiah Longanecker on 09/24/2022 16:27:09 -------------------------------------------------------------------------------- SuperBill Details Patient Name: Date of Service: [name], [name] 09/24/2022 Medical Record Number: [id number] Patient Account Number: [id number] Date of Birth/Sex: Treating RN: 08/10/1955 (66 y.o. Judie PetitM) Yevonne PaxEpps, Carrie Primary Care Provider: Renford DillsPolite, Ronald Other Clinician: Referring Provider: Treating Provider/Extender: Matthew FolksStone, Adren Dollins Polite, Ronald Weeks in Treatment: 10 Diagnosis Coding ICD-10 Codes Code Description E11.622 Type 2 diabetes mellitus with other skin ulcer I89.0 Lymphedema, not elsewhere classified L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 L97.812 Non-pressure chronic ulcer of other part of right lower leg with fat layer exposed [name], Teran J ([id number]) [id number] Page 7 of 7 I10 Essential (primary) hypertension I50.42 Chronic combined systolic (congestive) and diastolic (congestive) heart failure I49.9 Cardiac arrhythmia, unspecified Facility Procedures : CPT4 Code: [id number] Description: 11042 - DEB SUBQ TISSUE 20 SQ CM/< ICD-10 Diagnosis Description L97.822 Non-pressure chronic ulcer of other part of left lower leg with fat layer expo Modifier: sed Quantity: 1 Physician Procedures : CPT4 Code Description Modifier [id number] 11042 - WC PHYS SUBQ TISS 20 SQ CM ICD-10 Diagnosis Description L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 Quantity: 1 Electronic Signature(s) Signed: 09/24/2022 4:27:54 PM By: Allen DerryStone, Rohit Deloria PA-C Entered  By: Allen DerryStone, Danita Proud on 09/24/2022 16:27:53

## 2022-10-01 ENCOUNTER — Encounter: Payer: PPO | Admitting: Physician Assistant

## 2022-10-01 DIAGNOSIS — L97822 Non-pressure chronic ulcer of other part of left lower leg with fat layer exposed: Secondary | ICD-10-CM | POA: Diagnosis not present

## 2022-10-01 DIAGNOSIS — E11622 Type 2 diabetes mellitus with other skin ulcer: Secondary | ICD-10-CM | POA: Diagnosis not present

## 2022-10-01 NOTE — Progress Notes (Addendum)
ERLE, GUSTER ([id number]) [id number] Page 1 of 7 Visit Report for 10/01/2022 Chief Complaint Document Details Patient Name: Date of Service: [name] 10/01/2022 3:30 PM Medical Record Number: [id number] Patient Account Number: [id number] Date of Birth/Sex: Treating RN: Nov 02, 1955 (67 y.o. [name] Primary Care Provider: [name] Other Clinician: Referring Provider: Treating Provider/Extender: Matthew Folks in Treatment: 11 Information Obtained from: Patient Chief Complaint Bilateral LE ulcers and lymphedema Electronic Signature(s) Signed: 10/01/2022 3:35:38 PM By: Allen Derry PA-C Entered By: Allen Derry on 10/01/2022 15:35:37 -------------------------------------------------------------------------------- Debridement Details Patient Name: Date of Service: Matthew Burly [name] 10/01/2022 3:30 PM Medical Record Number: [id number] Patient Account Number: [id number] Date of Birth/Sex: Treating RN: Nov 15, 1955 (67 y.o. [name] Primary Care Provider: [name] Other Clinician: Referring Provider: Treating Provider/Extender: Matthew Folks in Treatment: 11 Debridement Performed for Assessment: Wound #2 Left,Circumferential Lower Leg Performed By: Physician Allen Derry, PA-C Debridement Type: Debridement Severity of Tissue Pre Debridement: Fat layer exposed Level of Consciousness (Pre-procedure): Awake and Alert Pre-procedure Verification/Time Out Yes - 15:51 Taken: Start Time: 15:51 T Area Debrided (L x W): otal 5 (cm) x 6 (cm) = 30 (cm) Tissue and other material debrided: Viable, Non-Viable, Slough, Subcutaneous, Biofilm, Slough Level: Skin/Subcutaneous Tissue Debridement Description: Excisional Instrument: Curette Bleeding: Minimum Hemostasis Achieved: Pressure Procedural Pain: 0 Response to Treatment: Procedure was tolerated well Level of Consciousness (Post- Awake and  Alert procedure): Post Debridement Measurements of Total Wound Length: (cm) 7.3 Width: (cm) 25.2 Depth: (cm) 0.3 Volume: (cm) 43.345 Character of Wound/Ulcer Post Debridement: Stable Severity of Tissue Post Debridement: Fat layer exposed Post Procedure Diagnosis Same as Pre-procedure Electronic Signature(s) Signed: 10/02/2022 9:57:56 AM By: Midge Aver MSN RN CNS WTA Signed: 10/02/2022 1:44:50 PM By: Allen Derry PA-C Previous Signature: 10/01/2022 4:52:52 PM Version By: Midge Aver MSN RN CNS WTA Entered By: Midge Aver on 10/02/2022 09:57:55 [name] ([id number]) [id number] Page 2 of 7 -------------------------------------------------------------------------------- HPI Details Patient Name: Date of Service: [name] 10/01/2022 3:30 PM Medical Record Number: [id number] Patient Account Number: [id number] Date of Birth/Sex: Treating RN: Mar 13, 1956 (67 y.o. [name] Primary Care Provider: [name] Other Clinician: Referring Provider: Treating Provider/Extender: Matthew Folks in Treatment: 11 History of Present Illness HPI Description: 07-16-2022 upon evaluation today patient appears to be doing poorly currently in regard to his his legs. The left leg is actually worse than the right. This is a patient that is previously been seen in the [place] office and at that point was doing really quite well with compression wrapping and often alginate are either Hydrofera Blue dressings. Fortunately he has gone since November 2020 until just current with out having any issue or need for wound care services which is great news. Unfortunately he is here today because he is having some issues at this point. Patient does have a history of several medical conditions which include diabetes mellitus type 2, lymphedema, hypertension, congestive heart failure, and cardiac arrhythmia though is not sure that this is actually  atrial fibrillation based on what he has been told. He does have lymphedema pumps but tells me he has not used them since the summer when he had to move to a remodel of his building and subsequently never unpacked them. 07-23-2022 upon evaluation today patient appears to be doing well with regard to his legs. He is going require some sharp debridement today but overall seems to be making some  progress. I am pleased in that regard he has much less drainage that he had did last week I think the compression wraps are definitely helping. 07-30-2022 upon evaluation today patient appears to be doing poorly in regard to his legs he actually has blue-green drainage which I think is consistent with Pseudomonas. I believe that we need to address this as soon as possible and I discussed that with the patient today as well. Fortunately I do not see any signs of active infection locally nor systemically at this time which is great news. No fevers, chills, nausea, vomiting, or diarrhea. 08-13-2022 upon evaluation today patient appears to be doing well currently in regard to his leg ulcers which are actually looking much better. Fortunately there does not appear to be any signs of active infection at this time which is great news. No fevers, chills, nausea, vomiting, or diarrhea. 3/7; patient with predominantly a left medial lower extremity wound as well as several smaller areas and a small area on the right lateral. His [name] came in today. We are using silver alginate as the primary dressing 09-03-2022 upon evaluation today patient appears to be doing well currently in regard to his wounds. He has been tolerating the dressing changes without complication. Fortunately there does not appear to be any signs of infection there is needed however for sharp debridement. 09-10-2022 upon evaluation today patient appears to be doing well currently in regard to his wound. Has been tolerating the dressing changes  without complication. Fortunately there does not appear to be any signs of infection looking systemically which is great news and overall I am extremely pleased with where we stand today. I do not see any signs of infection. 09-17-2022 upon evaluation today patient actually is making signs of improvement the good news is he is making good improvement. With that being said he still is having quite a time keeping the swelling down. We have been using Tubigrip I think that still probably our best option but nonetheless I think that we are making progress with regard to his wounds. 09-24-2022 upon evaluation today patient appears to be doing well currently in regard to his wounds is continuing to make improvements at this point which is great news and overall I am extremely pleased with where we stand today. Fortunately I do not see any evidence of active infection at this time which is great news and in general I think he is doing quite well with the [facility] and silver alginate. 10-01-2022 upon evaluation today patient appears to be doing pretty well currently in regard to his wounds. He has been tolerating the dressing changes without complication. Fortunately I do not see any evidence of active infection locally nor systemically which is great news and I do believe that Electronic Signature(s) Signed: 10/02/2022 9:53:54 AM By: Allen Derry PA-C Entered By: Allen Derry on 10/02/2022 09:53:54 -------------------------------------------------------------------------------- Physical Exam Details Patient Name: Date of Service: Matthew Macadam J. 10/01/2022 3:30 PM Medical Record Number: [id number] Patient Account Number: [id number] Date of Birth/Sex: Treating RN: Nov 16, 1955 (67 y.o. [name] Primary Care Provider: [name] Other Clinician: Referring Provider: Treating Provider/Extender: Matthew Folks in Treatment: 11 Constitutional Well-nourished and well-hydrated in no  acute distress. Respiratory normal breathing without difficulty. Psychiatric this patient is able to make decisions and demonstrates good insight into disease process. Alert and Oriented x 3. pleasant and cooperative. [name], [name] ([id number]) [id number] Page 3 of 7 Notes Upon inspection patient's wound bed actually  showed signs of good granulation epithelization at this point. Fortunately there does not appear to be any signs of active infection locally nor systemically which is great news. No fevers, chills, nausea, vomiting, or diarrhea. Electronic Signature(s) Signed: 10/02/2022 9:54:15 AM By: Allen Derry PA-C Entered By: Allen Derry on 10/02/2022 09:54:14 -------------------------------------------------------------------------------- Physician Orders Details Patient Name: Date of Service: Matthew Burly [name] 10/01/2022 3:30 PM Medical Record Number: [id number] Patient Account Number: [id number] Date of Birth/Sex: Treating RN: Jul 12, 1955 (67 y.o. [name] Primary Care Provider: [name] Other Clinician: Referring Provider: Treating Provider/Extender: Matthew Folks in Treatment: 11 Verbal / Phone Orders: No Diagnosis Coding ICD-10 Coding Code Description E11.622 Type 2 diabetes mellitus with other skin ulcer I89.0 Lymphedema, not elsewhere classified L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 L97.812 Non-pressure chronic ulcer of other part of right lower leg with fat layer exposed I10 Essential (primary) hypertension I50.42 Chronic combined systolic (congestive) and diastolic (congestive) heart failure I49.9 Cardiac arrhythmia, unspecified Follow-up Appointments Return Appointment in 1 week. Nurse Visit as needed Bathing/ Shower/ Hygiene May shower with wound dressing protected with water repellent cover or cast protector. Anesthetic (Use 'Patient Medications'  Section for Anesthetic Order Entry) Lidocaine applied to wound bed Edema Control - Lymphedema / Segmental Compressive Device / Other Elevate, Exercise Daily and A void Standing for Long Periods of Time. Elevate legs to the level of the heart and pump ankles as often as possible Elevate leg(s) parallel to the floor when sitting. Compression Pump: Use compression pump on left lower extremity for 60 minutes, twice daily. Compression Pump: Use compression pump on right lower extremity for 60 minutes, twice daily. DO YOUR BEST to sleep in the bed at night. DO NOT sleep in your recliner. Long hours of sitting in a recliner leads to swelling of the legs and/or potential wounds on your backside. Wound Treatment Wound #2 - Lower Leg Wound Laterality: Left, Circumferential Cleanser: Soap and Water 3 x Per Week/30 Days Discharge Instructions: Gently cleanse wound with antibacterial soap, rinse and pat dry prior to dressing wounds Topical: Keystone gel 3 x Per Week/30 Days Prim Dressing: AquacelAg Advantage Dressing, 4x5 (in/in) 3 x Per Week/30 Days ary Discharge Instructions: Apply to wound as directed Secondary Dressing: ABD Pad 5x9 (in/in) 3 x Per Week/30 Days Discharge Instructions: Cover with ABD pad Secured With: Tubigrip Size G, 4.5x10 (in/yd) 3 x Per Week/30 Days Discharge Instructions: single layer Electronic Signature(s) Signed: 10/01/2022 4:19:16 PM By: Midge Aver MSN RN CNS WTA Signed: 10/02/2022 1:44:50 PM By: Allen Derry PA-C Entered By: Midge Aver on 10/01/2022 16:19:16 [name] ([id number]) [id number] Page 4 of 7 -------------------------------------------------------------------------------- Problem List Details Patient Name: Date of Service: [name] 10/01/2022 3:30 PM Medical Record Number: [id number] Patient Account Number: [id number] Date of Birth/Sex: Treating RN: [birth year] (67 y.o. [name] Primary Care Provider:  [name] Other Clinician: Referring Provider: Treating Provider/Extender: Matthew Folks in Treatment: 11 Active Problems ICD-10 Encounter Code Description Active Date MDM Diagnosis E11.622 Type 2 diabetes mellitus with other skin ulcer 07/16/2022 No Yes I89.0 Lymphedema, not elsewhere classified 07/16/2022 No Yes L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2/06/2022 No Yes L97.812 Non-pressure chronic ulcer of other part of right lower leg with fat layer 07/16/2022 No Yes exposed I10 Essential (primary) hypertension 07/16/2022 No Yes I50.42 Chronic combined systolic (congestive) and diastolic (congestive)  heart failure 07/16/2022 No Yes I49.9 Cardiac arrhythmia, unspecified 07/16/2022 No Yes Inactive Problems Resolved Problems Electronic Signature(s) Signed: 10/01/2022 3:45:31 PM By: Midge Aver MSN RN CNS WTA Signed: 10/02/2022 1:44:50 PM By: Allen Derry PA-C Previous Signature: 10/01/2022 3:35:35 PM Version By: Allen Derry PA-C Entered By: Midge Aver on 10/01/2022 15:45:30 -------------------------------------------------------------------------------- Progress Note Details Patient Name: Date of Service: Matthew Burly [name] 10/01/2022 3:30 PM Medical Record Number: [id number] Patient Account Number: [id number] Date of Birth/Sex: Treating RN: 1955/09/27 (67 y.o. [name] Primary Care Provider: [name] Other Clinician: Referring Provider: Treating Provider/Extender: Matthew Folks in Treatment: 11 Subjective Chief Complaint Information obtained from Patient Bilateral LE ulcers and lymphedema [name], [name] ([id number]) [id number] Page 5 of 7 History of Present Illness (HPI) 07-16-2022 upon evaluation today patient appears to be doing poorly currently in regard to his his legs. The left leg is actually worse than the right. This is a patient that is previously  been seen in the [place] office and at that point was doing really quite well with compression wrapping and often alginate are either Hydrofera Blue dressings. Fortunately he has gone since November 2020 until just current with out having any issue or need for wound care services which is great news. Unfortunately he is here today because he is having some issues at this point. Patient does have a history of several medical conditions which include diabetes mellitus type 2, lymphedema, hypertension, congestive heart failure, and cardiac arrhythmia though is not sure that this is actually atrial fibrillation based on what he has been told. He does have lymphedema pumps but tells me he has not used them since the summer when he had to move to a remodel of his building and subsequently never unpacked them. 07-23-2022 upon evaluation today patient appears to be doing well with regard to his legs. He is going require some sharp debridement today but overall seems to be making some progress. I am pleased in that regard he has much less drainage that he had did last week I think the compression wraps are definitely helping. 07-30-2022 upon evaluation today patient appears to be doing poorly in regard to his legs he actually has blue-green drainage which I think is consistent with Pseudomonas. I believe that we need to address this as soon as possible and I discussed that with the patient today as well. Fortunately I do not see any signs of active infection locally nor systemically at this time which is great news. No fevers, chills, nausea, vomiting, or diarrhea. 08-13-2022 upon evaluation today patient appears to be doing well currently in regard to his leg ulcers which are actually looking much better. Fortunately there does not appear to be any signs of active infection at this time which is great news. No fevers, chills, nausea, vomiting, or diarrhea. 3/7; patient with predominantly a left medial lower  extremity wound as well as several smaller areas and a small area on the right lateral. His [name] came in today. We are using silver alginate as the primary dressing 09-03-2022 upon evaluation today patient appears to be doing well currently in regard to his wounds. He has been tolerating the dressing changes without complication. Fortunately there does not appear to be any signs of infection there is needed however for sharp debridement. 09-10-2022 upon evaluation today patient appears to be doing well currently in regard to his wound. Has been tolerating the dressing changes without complication. Fortunately there does not appear to be  any signs of infection looking systemically which is great news and overall I am extremely pleased with where we stand today. I do not see any signs of infection. 09-17-2022 upon evaluation today patient actually is making signs of improvement the good news is he is making good improvement. With that being said he still is having quite a time keeping the swelling down. We have been using Tubigrip I think that still probably our best option but nonetheless I think that we are making progress with regard to his wounds. 09-24-2022 upon evaluation today patient appears to be doing well currently in regard to his wounds is continuing to make improvements at this point which is great news and overall I am extremely pleased with where we stand today. Fortunately I do not see any evidence of active infection at this time which is great news and in general I think he is doing quite well with the [facility] and silver alginate. 10-01-2022 upon evaluation today patient appears to be doing pretty well currently in regard to his wounds. He has been tolerating the dressing changes without complication. Fortunately I do not see any evidence of active infection locally nor systemically which is great news and I do believe that Objective Constitutional Well-nourished and well-hydrated in  no acute distress. Vitals Time Taken: 3:31 PM, Height: 73 in, Weight: 358 lbs, BMI: 47.2, Temperature: 98.0 F, Pulse: 75 bpm, Respiratory Rate: 16 breaths/min, Blood Pressure: 134/82 mmHg. Respiratory normal breathing without difficulty. Psychiatric this patient is able to make decisions and demonstrates good insight into disease process. Alert and Oriented x 3. pleasant and cooperative. General Notes: Upon inspection patient's wound bed actually showed signs of good granulation epithelization at this point. Fortunately there does not appear to be any signs of active infection locally nor systemically which is great news. No fevers, chills, nausea, vomiting, or diarrhea. Integumentary (Hair, Skin) Wound #2 status is Open. Original cause of wound was Gradually Appeared. The date acquired was: 06/15/2022. The wound has been in treatment 11 weeks. The wound is located on the Left,Circumferential Lower Leg. The wound measures 7.3cm length x 25.2cm width x 0.3cm depth; 144.482cm^2 area and 43.345cm^3 volume. There is Fat Layer (Subcutaneous Tissue) exposed. There is a medium amount of serosanguineous drainage noted. There is medium (34-66%) red granulation within the wound bed. There is a medium (34-66%) amount of necrotic tissue within the wound bed. Assessment Active Problems ICD-10 Type 2 diabetes mellitus with other skin ulcer Lymphedema, not elsewhere classified Non-pressure chronic ulcer of other part of left lower leg with fat layer exposed Non-pressure chronic ulcer of other part of right lower leg with fat layer exposed Essential (primary) hypertension Chronic combined systolic (congestive) and diastolic (congestive) heart failure [name], [name] ([id number]) [id number] Page 6 of 7 Cardiac arrhythmia, unspecified Procedures Wound #2 Pre-procedure diagnosis of Wound #2 is a Diabetic Wound/Ulcer of the Lower Extremity located on the Left,Circumferential Lower  Leg .Severity of Tissue Pre Debridement is: Fat layer exposed. There was a Excisional Skin/Subcutaneous Tissue Debridement with a total area of 30 sq cm performed by Allen Derry, PA-C. With the following instrument(s): Curette to remove Viable and Non-Viable tissue/material. Material removed includes Subcutaneous Tissue, Slough, and Biofilm. No specimens were taken. A time out was conducted at 15:51, prior to the start of the procedure. A Minimum amount of bleeding was controlled with Pressure. The procedure was tolerated well with a pain level of 0 throughout. Post Debridement Measurements: 7.3cm length x 25.2cm width x 0.3cm depth;  43.345cm^3 volume. Character of Wound/Ulcer Post Debridement is stable. Severity of Tissue Post Debridement is: Fat layer exposed. Post procedure Diagnosis Wound #2: Same as Pre-Procedure Plan Follow-up Appointments: Return Appointment in 1 week. Nurse Visit as needed Bathing/ Shower/ Hygiene: May shower with wound dressing protected with water repellent cover or cast protector. Anesthetic (Use 'Patient Medications' Section for Anesthetic Order Entry): Lidocaine applied to wound bed Edema Control - Lymphedema / Segmental Compressive Device / Other: Elevate, Exercise Daily and Avoid Standing for Long Periods of Time. Elevate legs to the level of the heart and pump ankles as often as possible Elevate leg(s) parallel to the floor when sitting. Compression Pump: Use compression pump on left lower extremity for 60 minutes, twice daily. Compression Pump: Use compression pump on right lower extremity for 60 minutes, twice daily. DO YOUR BEST to sleep in the bed at night. DO NOT sleep in your recliner. Long hours of sitting in a recliner leads to swelling of the legs and/or potential wounds on your backside. WOUND #2: - Lower Leg Wound Laterality: Left, Circumferential Cleanser: Soap and Water 3 x Per Week/30 Days Discharge Instructions: Gently cleanse wound with  antibacterial soap, rinse and pat dry prior to dressing wounds Topical: Keystone gel 3 x Per Week/30 Days Prim Dressing: AquacelAg Advantage Dressing, 4x5 (in/in) 3 x Per Week/30 Days ary Discharge Instructions: Apply to wound as directed Secondary Dressing: ABD Pad 5x9 (in/in) 3 x Per Week/30 Days Discharge Instructions: Cover with ABD pad Secured With: Tubigrip Size G, 4.5x10 (in/yd) 3 x Per Week/30 Days Discharge Instructions: single layer 1. I am going to recommend based on what we are seeing currently that we go ahead and initiate a continuation of treatment with the silver alginate dressing. There was a mixup where he got sent Sorbact on which she has not been doing well it breaks down and he is just not been very pleased with this at all. Is also causing burning to the wounds which is unusual and he has not had any issues. There is no signs of infection. With that being said we are going to go ahead and see about getting this switched over wearing touch with prism to try to get this fixed. 2. We are going to continue with the ABD pads to cover followed by the Tubigrip size G. We will see patient back for reevaluation in 1 week here in the clinic. If anything worsens or changes patient will contact our office for additional recommendations. Electronic Signature(s) Signed: 10/02/2022 10:00:45 AM By: Allen Derry PA-C Previous Signature: 10/02/2022 9:54:46 AM Version By: Allen Derry PA-C Entered By: Allen Derry on 10/02/2022 10:00:45 -------------------------------------------------------------------------------- SuperBill Details Patient Name: Date of Service: [name], Matthew [name] 10/01/2022 Medical Record Number: [id number] Patient Account Number: [id number] Date of Birth/Sex: Treating RN: 04-13-1956 (67 y.o. [name] Primary Care Provider: [name] Other Clinician: Referring Provider: Treating Provider/Extender: Matthew Folks in Treatment: [address] [name], [name] ([id number]) [id number] Page 7 of 7 ICD-10 Codes Code Description E11.622 Type 2 diabetes mellitus with other skin ulcer I89.0 Lymphedema, not elsewhere classified L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 L97.812 Non-pressure chronic ulcer of other part of right lower leg with fat layer exposed I10 Essential (primary) hypertension I50.42 Chronic combined systolic (congestive) and diastolic (congestive) heart failure I49.9 Cardiac arrhythmia, unspecified Facility Procedures : CPT4 Code: [id number] Description: 11042 - DEB SUBQ TISSUE 20 SQ CM/< ICD-10 Diagnosis  Description [phone] Non-pressure chronic ulcer of other part of left lower leg with fat layer expo Modifier: sed Quantity: 1 : CPT4 Code: [id number] Description: 11045 - DEB SUBQ TISS EA ADDL 20CM ICD-10 Diagnosis Description L97.822 Non-pressure chronic ulcer of other part of left lower leg with fat layer expo Modifier: sed Quantity: 1 Physician Procedures : CPT4 Code Description Modifier 1914782 11042 - WC PHYS SUBQ TISS 20 SQ CM ICD-10 Diagnosis Description L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 Quantity: 1 : 9562130 11045 - WC PHYS SUBQ TISS EA ADDL 20 CM ICD-10 Diagnosis Description L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 Quantity: 1 Electronic Signature(s) Signed: 10/02/2022 10:01:04 AM By: Allen Derry PA-C Entered By: Allen Derry on 10/02/2022 10:01:04

## 2022-10-02 DIAGNOSIS — I87311 Chronic venous hypertension (idiopathic) with ulcer of right lower extremity: Secondary | ICD-10-CM | POA: Diagnosis not present

## 2022-10-02 NOTE — Progress Notes (Addendum)
DAE, HIGHLEY ([id number]) [id number] Page 1 of 7 Visit Report for 10/01/2022 Arrival Information Details Patient Name: Date of Service: [name] 10/01/2022 3:30 PM Medical Record Number: [id number] Patient Account Number: [id number] Date of Birth/Sex: Treating RN: 11/07/1955 (67 y.o. [name]: [name] Other Clinician: Referring [name]: Treating [name]/Extender: [name] in Treatment: 11 Visit Information History Since Last Visit Added or deleted any medications: No Patient Arrived: Ambulatory Has Dressing in Place as Prescribed: Yes Arrival Time: 15:28 Pain Present Now: No Accompanied By: caretaker Transfer Assistance: None Patient Identification Verified: Yes Secondary Verification Process Completed: Yes Patient Requires Transmission-Based Precautions: No Patient Has Alerts: No Electronic Signature(s) Signed: 10/01/2022 3:44:27 PM By: Matthew Aver MSN RN CNS WTA Previous Signature: 10/01/2022 3:44:17 PM Version By: Matthew Aver MSN RN CNS WTA Entered By: [name] on 10/01/2022 15:44:27 -------------------------------------------------------------------------------- Clinic Level of Care Assessment Details Patient Name: Date of Service: [name] Matthew J. 10/01/2022 3:30 PM Medical Record Number: [id number] Patient Account Number: [id number] Date of Birth/Sex: Treating RN: 01/09/1956 (67 y.o. [name] Primary Care Tarisha Fader: [name] Other Clinician: Referring Kaiden Dardis: Treating Merlon Alcorta/Extender: [name] in Treatment: 11 Clinic Level of Care Assessment Items TOOL 1 Quantity Score []  - 0 Use when EandM and Procedure is performed on INITIAL visit ASSESSMENTS - Nursing Assessment / Reassessment []  - 0 General Physical Exam (combine w/ comprehensive assessment (listed just below) when performed on new pt. evals) []  - 0 Comprehensive  Assessment (HX, ROS, Risk Assessments, Wounds Hx, etc.) ASSESSMENTS - Wound and Skin Assessment / Reassessment []  - 0 Dermatologic / Skin Assessment (not related to wound area) ASSESSMENTS - Ostomy and/or Continence Assessment and Care []  - 0 Incontinence Assessment and Management []  - 0 Ostomy Care Assessment and Management (repouching, etc.) PROCESS - Coordination of Care []  - 0 Simple Patient / Family Education for ongoing care []  - 0 Complex (extensive) Patient / Family Education for ongoing care []  - 0 Staff obtains Chiropractor, Records, T Results / Process Orders est []  - 0 Staff telephones HHA, Nursing Homes / Clarify orders / etc []  - 0 Routine Transfer to another Facility (non-emergent condition) []  - 0 Routine Hospital Admission (non-emergent condition) []  - 0 New Admissions / Manufacturing engineer / Ordering NPWT Apligraf, etc. , []  - 0 Emergency Hospital Admission (emergent condition) PROCESS - Special Needs [name] ([id number]) [id number] Page 2 of 7 []  - 0 Pediatric / Minor Patient Management []  - 0 Isolation Patient Management []  - 0 Hearing / Language / Visual special needs []  - 0 Assessment of Community assistance (transportation, D/C planning, etc.) []  - 0 Additional assistance / Altered mentation []  - 0 Support Surface(s) Assessment (bed, cushion, seat, etc.) INTERVENTIONS - Miscellaneous []  - 0 External ear exam []  - 0 Patient Transfer (multiple staff / Nurse, adult / Similar devices) []  - 0 Simple Staple / Suture removal (25 or less) []  - 0 Complex Staple / Suture removal (26 or more) []  - 0 Hypo/Hyperglycemic Management (do not check if billed separately) []  - 0 Ankle / Brachial Index (ABI) - do not check if billed separately Has the patient been seen at the hospital within the last three years: Yes Total Score: 0 Level Of Care: ____ Electronic Signature(s) Signed: 10/01/2022 4:52:52 PM By: Matthew Aver  MSN RN CNS WTA Entered By: [name] on 10/01/2022 16:19:23 -------------------------------------------------------------------------------- Encounter Discharge Information Details Patient Name: Date of Service: [name], Matthew Matthew J. 10/01/2022 3:30 PM Medical Record Number: [id number] Patient Account Number: [id number] Date of Birth/Sex: Treating RN: 20-May-1956 (67 y.o. [name] Primary Care Latravis Grine: [name] Other Clinician: Referring Deveon Kisiel: Treating Audriana Aldama/Extender: [name] in Treatment: 11 Encounter Discharge Information Items Post Procedure Vitals Discharge Condition: Stable Temperature (F): 98.0 Ambulatory Status: Walker Pulse (bpm): 75 Discharge Destination: Home Respiratory Rate (breaths/min): 16 Transportation: Private Auto Blood Pressure (mmHg): 134/82 Accompanied By: caretaker Schedule Follow-up Appointment: Yes Clinical Summary of Care: Electronic Signature(s) Signed: 10/02/2022 9:58:51 AM By: Matthew Aver MSN RN CNS WTA Previous Signature: 10/01/2022 4:20:20 PM Version By: Matthew Aver MSN RN CNS WTA Previous Signature: 10/01/2022 3:46:15 PM Version By: Matthew Aver MSN RN CNS WTA Entered By: [name] on 10/02/2022 09:58:50 -------------------------------------------------------------------------------- Lower Extremity Assessment Details Patient Name: Date of Service: [name] Matthew J. 10/01/2022 3:30 PM Medical Record Number: [id number] Patient Account Number: [id number] Date of Birth/Sex: Treating RN: [birth year] (68 y.o. [name] Primary Care Jebadiah Imperato: [name] Other Clinician: Referring Kammy Klett: Treating Armiyah Capron/Extender: [name] in Treatment: 11 Edema Assessment Assessed: [name]: Yes] [name]: No] Edema: [Left: Ye] [name]: s] G[LeftJerrye [name] ([id number])] [Right: [id number] Page 3 of 7] Calf Left: Right: Point of Measurement: 35 cm From  Medial Instep 57 cm Ankle Left: Right: Point of Measurement: 10 cm From Medial Instep 32.8 cm Vascular Assessment Pulses: Dorsalis Pedis Palpable: [Left:Yes] Electronic Signature(s) Signed: 10/01/2022 4:52:52 PM By: Matthew Aver MSN RN CNS WTA Entered By: [name] on 10/01/2022 15:50:11 -------------------------------------------------------------------------------- Multi Wound Chart Details Patient Name: Date of Service: [name] Matthew J. 10/01/2022 3:30 PM Medical Record Number: [id number] Patient Account Number: [id number] Date of Birth/Sex: Treating RN: [birth year] (67 y.o. [name] Primary Care Shilo Pauwels: [name] Other Clinician: Referring Mallisa Alameda: Treating Revia Nghiem/Extender: [name] in Treatment: 11 Vital Signs Height(in): 73 Pulse(bpm): 75 Weight(lbs): 358 Blood Pressure(mmHg): 134/82 Body Mass Index(BMI): 47.2 Temperature(F): 98.0 Respiratory Rate(breaths/min): 16 [2:Photos:] [N/A:N/A] Left, Circumferential Lower Leg N/A N/A Wound Location: Gradually Appeared N/A N/A Wounding Event: Diabetic Wound/Ulcer of the Lower N/A N/A Primary Etiology: Extremity Arrhythmia, Congestive Heart Failure, N/A N/A Comorbid History: Hypertension, Type II Diabetes 06/15/2022 N/A N/A Date Acquired: 11 N/A N/A Weeks of Treatment: Open N/A N/A Wound Status: No N/A N/A Wound Recurrence: 7.3x25.2x0.3 N/A N/A Measurements L x W x D (cm) 144.482 N/A N/A A (cm) : rea 43.345 N/A N/A Volume (cm) : 76.00% N/A N/A % Reduction in Area: 27.90% N/A N/A % Reduction in Volume: Grade 1 N/A N/A Classification: Matthew, [name] ([id number]) [id number] Page 4 of 7 Medium N/A N/A Exudate Amount: Serosanguineous N/A N/A Exudate Type: red, brown N/A N/A Exudate Color: Medium (34-66%) N/A N/A Granulation Amount: Red N/A N/A Granulation Quality: Medium (34-66%) N/A N/A Necrotic Amount: Fat Layer (Subcutaneous  Tissue): Yes N/A N/A Exposed Structures: Fascia: No Tendon: No Muscle: No Joint: No Bone: No None N/A N/A Epithelialization: Treatment Notes Wound #2 (Lower Leg) Wound Laterality: Left, Circumferential Cleanser Soap and Water Discharge Instruction: Gently cleanse wound with antibacterial soap, rinse and pat dry prior to dressing wounds Peri-Wound Care Topical Keystone gel Primary Dressing SILVERCEL Antimicrobial Alginate Dressing, 1x12 (in/in) Secondary Dressing ABD Pad 5x9 (in/in) Discharge Instruction: Cover with ABD pad Secured With Tubigrip Size G, 4.5x10 (in/yd) Discharge Instruction: single layer Compression Wrap Compression Stockings Add-Ons Electronic Signature(s) Signed: 10/01/2022 4:52:52 PM By: Matthew Aver MSN RN CNS WTA Entered By: [name] on  10/01/2022 15:50:49 -------------------------------------------------------------------------------- Multi-Disciplinary Care Plan Details Patient Name: Date of Service: [name] 10/01/2022 3:30 PM Medical Record Number: [id number] Patient Account Number: [id number] Date of Birth/Sex: Treating RN: 1956-06-05 (67 y.o. [name] Primary Care Itamar Mcgowan: [name] Other Clinician: Referring Aki Abalos: Treating Donel Osowski/Extender: [name] in Treatment: 11 Active Inactive Wound/Skin Impairment Nursing Diagnoses: Knowledge deficit related to ulceration/compromised skin integrity Goals: Patient/caregiver will verbalize understanding of skin care regimen Date Initiated: 07/16/2022 Target Resolution Date: 10/24/2022 Goal Status: Active Ulcer/skin breakdown will have a volume reduction of 30% by week 4 Date Initiated: 07/16/2022 Date Inactivated: 09/10/2022 Target Resolution Date: 08/14/2022 Goal Status: Unmet Unmet Reason: comorbidties Ulcer/skin breakdown will have a volume reduction of 50% by week 8 [name], [name] ([id number]) [id number] Page 5 of  7 Date Initiated: 07/16/2022 Date Inactivated: 09/24/2022 Target Resolution Date: 09/14/2022 Goal Status: Unmet Unmet Reason: comorbidities Ulcer/skin breakdown will have a volume reduction of 80% by week 12 Date Initiated: 07/16/2022 Target Resolution Date: 10/14/2022 Goal Status: Active Ulcer/skin breakdown will heal within 14 weeks Date Initiated: 07/16/2022 Target Resolution Date: 11/14/2022 Goal Status: Active Interventions: Assess patient/caregiver ability to obtain necessary supplies Assess patient/caregiver ability to perform ulcer/skin care regimen upon admission and as needed Assess ulceration(s) every visit Notes: Electronic Signature(s) Signed: 10/01/2022 3:44:59 PM By: Matthew Aver MSN RN CNS WTA Entered By: [name] on 10/01/2022 15:44:58 -------------------------------------------------------------------------------- Pain Assessment Details Patient Name: Date of Service: [name] Matthew J. 10/01/2022 3:30 PM Medical Record Number: [id number] Patient Account Number: [id number] Date of Birth/Sex: Treating RN: 03-08-1956 (67 y.o. [name] Primary Care Hanz Winterhalter: [name] Other Clinician: Referring Koda Defrank: Treating Verdella Laidlaw/Extender: [name] in Treatment: 11 Active Problems Location of Pain Severity and Description of Pain Patient Has Paino No Site Locations Pain Management and Medication Current Pain Management: Electronic Signature(s) Signed: 10/01/2022 3:44:36 PM By: Matthew Aver MSN RN CNS WTA Entered By: [name] on 10/01/2022 15:44:36 -------------------------------------------------------------------------------- Patient/Caregiver Education Details Patient Name: Date of Service: [name] 4/18/2024andnbsp3:30 PM Medical Record Number: [id number] Patient Account Number: [id number] Date of Birth/Gender: Treating RN: November 15, 1955 (67 y.o. [name] Primary Care Physician: [name] Other  Clinician: Referring Physician: Treating Physician/Extender: [name] in Treatment: [address] ([id number]) [phone].pdf Page 6 of 7 Education Assessment Education Provided To: Patient Education Topics Provided Wound/Skin Impairment: Handouts: Caring for Your Ulcer Methods: Explain/Verbal Responses: State content correctly Electronic Signature(s) Signed: 10/01/2022 4:52:52 PM By: Matthew Aver MSN RN CNS WTA Entered By: [name] on 10/01/2022 15:45:16 -------------------------------------------------------------------------------- Wound Assessment Details Patient Name: Date of Service: [name] Matthew J. 10/01/2022 3:30 PM Medical Record Number: [id number] Patient Account Number: [id number] Date of Birth/Sex: Treating RN: 1955/08/07 (67 y.o. [name] Primary Care Kathye Cipriani: [name] Other Clinician: Referring Jackqueline Aquilar: Treating Juanda Luba/Extender: [name] in Treatment: 11 Wound Status Wound Number: 2 Primary Diabetic Wound/Ulcer of the Lower Extremity Etiology: Wound Location: Left, Circumferential Lower Leg Wound Status: Open Wounding Event: Gradually Appeared Comorbid Arrhythmia, Congestive Heart Failure, Hypertension, Type II Date Acquired: 06/15/2022 History: Diabetes Weeks Of Treatment: 11 Clustered Wound: No Photos Wound Measurements Length: (cm) 7.3 Width: (cm) 25.2 Depth: (cm) 0.3 Area: (cm) 144.482 Volume: (cm) 43.345 % Reduction in Area: 76% % Reduction in Volume: 27.9% Epithelialization: None Wound Description Classification: Grade 1 Exudate Amount: Medium Exudate Type: Serosanguineous Exudate Color: red, brown Foul Odor After Cleansing: No Slough/Fibrino Yes Wound Bed  Granulation Amount: Medium (34-66%) Exposed Structure Granulation Quality: Red Fascia Exposed: No Necrotic Amount: Medium (34-66%) Fat Layer (Subcutaneous Tissue) Exposed: Yes Tendon  Exposed: No Muscle Exposed: No Joint Exposed: No [name], [name] ([id number]) [id number] Page 7 of 7 Bone Exposed: No Treatment Notes Wound #2 (Lower Leg) Wound Laterality: Left, Circumferential Cleanser Soap and Water Discharge Instruction: Gently cleanse wound with antibacterial soap, rinse and pat dry prior to dressing wounds Peri-Wound Care Topical Keystone gel Primary Dressing AquacelAg Advantage Dressing, 4x5 (in/in) Discharge Instruction: Apply to wound as directed Secondary Dressing ABD Pad 5x9 (in/in) Discharge Instruction: Cover with ABD pad Secured With Tubigrip Size G, 4.5x10 (in/yd) Discharge Instruction: single layer Compression Wrap Compression Stockings Add-Ons Electronic Signature(s) Signed: 10/01/2022 4:39:55 PM By: Matthew Aver MSN RN CNS WTA Entered By: [name] on 10/01/2022 16:39:55 -------------------------------------------------------------------------------- Vitals Details Patient Name: Date of Service: [name] Matthew J. 10/01/2022 3:30 PM Medical Record Number: [id number] Patient Account Number: [id number] Date of Birth/Sex: Treating RN: 09-01-1955 (67 y.o. [name] Primary Care Chrisean Kloth: [name] Other Clinician: Referring Natalie Leclaire: Treating Briannia Laba/Extender: [name] in Treatment: 11 Vital Signs Time Taken: 15:31 Temperature (F): 98.0 Height (in): 73 Pulse (bpm): 75 Weight (lbs): 358 Respiratory Rate (breaths/min): 16 Body Mass Index (BMI): 47.2 Blood Pressure (mmHg): 134/82 Reference Range: 80 - 120 mg / dl Electronic Signature(s) Signed: 10/01/2022 3:44:32 PM By: Matthew Aver MSN RN CNS WTA Entered By: [name] on 10/01/2022 15:44:32

## 2022-10-08 ENCOUNTER — Encounter: Payer: PPO | Admitting: Physician Assistant

## 2022-10-08 DIAGNOSIS — L97822 Non-pressure chronic ulcer of other part of left lower leg with fat layer exposed: Secondary | ICD-10-CM | POA: Diagnosis not present

## 2022-10-08 DIAGNOSIS — E11622 Type 2 diabetes mellitus with other skin ulcer: Secondary | ICD-10-CM | POA: Diagnosis not present

## 2022-10-09 NOTE — Progress Notes (Signed)
GIANLUCAS, EVENSON ([id number]) [id number] Page 1 of 7 Visit Report for 10/08/2022 Chief Complaint Document Details Patient Name: Date of Service: [name] 10/08/2022 7:30 A M Medical Record Number: [id number] Patient Account Number: [id number] Date of Birth/Sex: Treating RN: 12/02/1955 (67 y.o. [name] Primary Care Provider: [name] Other Clinician: Referring Provider: Treating Provider/Extender: Matthew Folks in Treatment: 12 Information Obtained from: Patient Chief Complaint Bilateral LE ulcers and lymphedema Electronic Signature(s) Signed: 10/08/2022 5:47:58 PM By: Allen Derry PA-C Entered By: Allen Derry on 10/08/2022 07:55:59 -------------------------------------------------------------------------------- Debridement Details Patient Name: Date of Service: [name] 10/08/2022 7:30 A M Medical Record Number: [id number] Patient Account Number: [id number] Date of Birth/Sex: Treating RN: [birth year] (67 y.o. [name] Primary Care Provider: [name] Other Clinician: Referring Provider: Treating Provider/Extender: Matthew Folks in Treatment: 12 Debridement Performed for Assessment: Wound #2 Left,Circumferential Lower Leg Performed By: Physician Allen Derry, PA-C Debridement Type: Debridement Severity of Tissue Pre Debridement: Fat layer exposed Level of Consciousness (Pre-procedure): Awake and Alert Pre-procedure Verification/Time Out Yes - 08:07 Taken: Pain Control: Lidocaine 4% T opical Solution Percent of Wound Bed Debrided: 5% T Area Debrided (cm): otal 7.38 Tissue and other material debrided: Viable, Non-Viable, Slough, Subcutaneous, Slough Level: Skin/Subcutaneous Tissue Debridement Description: Excisional Instrument: Curette Bleeding: Moderate Hemostasis Achieved: Pressure Response to Treatment: Procedure was tolerated well Level of Consciousness  (Post- Awake and Alert procedure): Post Debridement Measurements of Total Wound Length: (cm) 8 Width: (cm) 23.5 Depth: (cm) 0.3 Volume: (cm) 44.296 Character of Wound/Ulcer Post Debridement: Stable Severity of Tissue Post Debridement: Fat layer exposed Post Procedure Diagnosis Same as Pre-procedure Electronic Signature(s) Signed: 10/08/2022 4:36:23 PM By: Angelina Pih Signed: 10/08/2022 5:47:58 PM By: Allen Derry PA-C Entered By: Angelina Pih on 10/08/2022 08:11:01 [name] ([id number]) [id number] Page 2 of 7 -------------------------------------------------------------------------------- HPI Details Patient Name: Date of Service: [name] 10/08/2022 7:30 A M Medical Record Number: [id number] Patient Account Number: [id number] Date of Birth/Sex: Treating RN: [birth year] (67 y.o. [name] Primary Care Provider: [name] Other Clinician: Referring Provider: Treating Provider/Extender: Matthew Folks in Treatment: 12 History of Present Illness HPI Description: 07-16-2022 upon evaluation today patient appears to be doing poorly currently in regard to his his legs. The left leg is actually worse than the right. This is a patient that is previously been seen in the [place] office and at that point was doing really quite well with compression wrapping and often alginate are either Hydrofera Blue dressings. Fortunately he has gone since November 2020 until just current with out having any issue or need for wound care services which is great news. Unfortunately he is here today because he is having some issues at this point. Patient does have a history of several medical conditions which include diabetes mellitus type 2, lymphedema, hypertension, congestive heart failure, and cardiac arrhythmia though is not sure that this is actually atrial fibrillation based on what he has been told. He does have lymphedema  pumps but tells me he has not used them since the summer when he had to move to a remodel of his building and subsequently never unpacked them. 07-23-2022 upon evaluation today patient appears to be doing well with regard to his legs. He is going require some sharp debridement today but overall seems to be making some progress. I am pleased in that regard he has much less drainage that he had did last  week I think the compression wraps are definitely helping. 07-30-2022 upon evaluation today patient appears to be doing poorly in regard to his legs he actually has blue-green drainage which I think is consistent with Pseudomonas. I believe that we need to address this as soon as possible and I discussed that with the patient today as well. Fortunately I do not see any signs of active infection locally nor systemically at this time which is great news. No fevers, chills, nausea, vomiting, or diarrhea. 08-13-2022 upon evaluation today patient appears to be doing well currently in regard to his leg ulcers which are actually looking much better. Fortunately there does not appear to be any signs of active infection at this time which is great news. No fevers, chills, nausea, vomiting, or diarrhea. 3/7; patient with predominantly a left medial lower extremity wound as well as several smaller areas and a small area on the right lateral. His Jodie Echevaria came in today. We are using silver alginate as the primary dressing 09-03-2022 upon evaluation today patient appears to be doing well currently in regard to his wounds. He has been tolerating the dressing changes without complication. Fortunately there does not appear to be any signs of infection there is needed however for sharp debridement. 09-10-2022 upon evaluation today patient appears to be doing well currently in regard to his wound. Has been tolerating the dressing changes without complication. Fortunately there does not appear to be any signs of infection  looking systemically which is great news and overall I am extremely pleased with where we stand today. I do not see any signs of infection. 09-17-2022 upon evaluation today patient actually is making signs of improvement the good news is he is making good improvement. With that being said he still is having quite a time keeping the swelling down. We have been using Tubigrip I think that still probably our best option but nonetheless I think that we are making progress with regard to his wounds. 09-24-2022 upon evaluation today patient appears to be doing well currently in regard to his wounds is continuing to make improvements at this point which is great news and overall I am extremely pleased with where we stand today. Fortunately I do not see any evidence of active infection at this time which is great news and in general I think he is doing quite well with the [facility] and silver alginate. 10-01-2022 upon evaluation today patient appears to be doing pretty well currently in regard to his wounds. He has been tolerating the dressing changes without complication. Fortunately I do not see any evidence of active infection locally nor systemically which is great news and I do believe that 10-08-2022 upon evaluation today patient's wounds actually are showing some signs of improvement which is great news. Still this is very slow to heal I really feel like he would do better if we can get him in some stronger compression wrapping he does have the juxta lite compression wraps therefore we can actually see about doing that over top of his Tubigrip to see if that can be beneficial. He does not love the hide he had as they are not the most comfortable thing for him but nonetheless I think it would be beneficial he is in agreement with going forward with this. Electronic Signature(s) Signed: 10/08/2022 5:46:20 PM By: Allen Derry PA-C Entered By: Allen Derry on 10/08/2022  17:46:20 -------------------------------------------------------------------------------- Physical Exam Details Patient Name: Date of Service: [name], [name] 10/08/2022 7:30 A M Medical  Record Number: [id number] Patient Account Number: [id number] Date of Birth/Sex: Treating RN: Aug 16, 1955 (67 y.o. [name] Primary Care Provider: [name] Other Clinician: Referring Provider: Treating Provider/Extender: Matthew Folks in Treatment: 12 Constitutional Obese and well-hydrated in no acute distress. Respiratory normal breathing without difficulty. PUNEET, MASONER ([id number]) [id number] Page 3 of 7 Psychiatric this patient is able to make decisions and demonstrates good insight into disease process. Alert and Oriented x 3. pleasant and cooperative. Notes Upon inspection patient's wound bed actually showed signs of the need for sharp debridement across the board form those debridements and postdebridement wound bed appears to be doing much better which is great news. I did Electronic Signature(s) Signed: 10/08/2022 5:46:45 PM By: Allen Derry PA-C Entered By: Allen Derry on 10/08/2022 17:46:45 -------------------------------------------------------------------------------- Physician Orders Details Patient Name: Date of Service: [name] 10/08/2022 7:30 A M Medical Record Number: [id number] Patient Account Number: [id number] Date of Birth/Sex: Treating RN: [birth year] (67 y.o. [name] Primary Care Provider: [name] Other Clinician: Referring Provider: Treating Provider/Extender: Matthew Folks in Treatment: 12 Verbal / Phone Orders: No Diagnosis Coding ICD-10 Coding Code Description E11.622 Type 2 diabetes mellitus with other skin ulcer I89.0 Lymphedema, not elsewhere classified L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 L97.812  Non-pressure chronic ulcer of other part of right lower leg with fat layer exposed I10 Essential (primary) hypertension I50.42 Chronic combined systolic (congestive) and diastolic (congestive) heart failure I49.9 Cardiac arrhythmia, unspecified Follow-up Appointments Return Appointment in 1 week. Nurse Visit as needed Bathing/ Shower/ Hygiene May shower with wound dressing protected with water repellent cover or cast protector. Anesthetic (Use 'Patient Medications' Section for Anesthetic Order Entry) Lidocaine applied to wound bed Edema Control - Lymphedema / Segmental Compressive Device / Other Tubigrip single layer applied. - wear juxtalite over top of tubi grip Elevate, Exercise Daily and A void Standing for Long Periods of Time. Elevate legs to the level of the heart and pump ankles as often as possible Elevate leg(s) parallel to the floor when sitting. Compression Pump: Use compression pump on left lower extremity for 60 minutes, twice daily. Compression Pump: Use compression pump on right lower extremity for 60 minutes, twice daily. DO YOUR BEST to sleep in the bed at night. DO NOT sleep in your recliner. Long hours of sitting in a recliner leads to swelling of the legs and/or potential wounds on your backside. Wound Treatment Wound #2 - Lower Leg Wound Laterality: Left, Circumferential Cleanser: Soap and Water 3 x Per Week/30 Days Discharge Instructions: Gently cleanse wound with antibacterial soap, rinse and pat dry prior to dressing wounds Topical: Keystone gel 3 x Per Week/30 Days Prim Dressing: AquacelAg Advantage Dressing, 4x5 (in/in) 3 x Per Week/30 Days ary Discharge Instructions: Apply to wound as directed Secondary Dressing: ABD Pad 5x9 (in/in) 3 x Per Week/30 Days Discharge Instructions: Cover with ABD pad Secured With: Tubigrip Size G, 4.5x10 (in/yd) 3 x Per Week/30 Days Discharge Instructions: single layer DASAN, HARDMAN ([id number])  [id number] Page 4 of 7 Electronic Signature(s) Signed: 10/08/2022 4:36:23 PM By: Angelina Pih Signed: 10/08/2022 5:47:58 PM By: Allen Derry PA-C Entered By: Angelina Pih on 10/08/2022 11:09:38 -------------------------------------------------------------------------------- Problem List Details Patient Name: Date of Service: [name] 10/08/2022 7:30 A M Medical Record Number: [id number] Patient Account Number: [id number] Date of Birth/Sex: Treating RN: [birth year] (67 y.o. [name] Primary Care Provider: [name]  Other Clinician: Referring Provider: Treating Provider/Extender: Matthew Folks in Treatment: 12 Active Problems ICD-10 Encounter Code Description Active Date MDM Diagnosis E11.622 Type 2 diabetes mellitus with other skin ulcer 07/16/2022 No Yes I89.0 Lymphedema, not elsewhere classified 07/16/2022 No Yes L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2/06/2022 No Yes L97.812 Non-pressure chronic ulcer of other part of right lower leg with fat layer 07/16/2022 No Yes exposed I10 Essential (primary) hypertension 07/16/2022 No Yes I50.42 Chronic combined systolic (congestive) and diastolic (congestive) heart failure 07/16/2022 No Yes I49.9 Cardiac arrhythmia, unspecified 07/16/2022 No Yes Inactive Problems Resolved Problems Electronic Signature(s) Signed: 10/08/2022 5:47:58 PM By: Allen Derry PA-C Entered By: Allen Derry on 10/08/2022 07:55:51 -------------------------------------------------------------------------------- Progress Note Details Patient Name: Date of Service: [name] 10/08/2022 7:30 A M Medical Record Number: [id number] Patient Account Number: [id number] Date of Birth/Sex: Treating RN: 01-Mar-1956 (66 y.o. [name] Primary Care Provider: [name] Other Clinician: Referring Provider: Treating Provider/Extender: Matthew Folks in Treatment: [address] CALAHAN, PAK ([id number]) [id number] Page 5 of 7 Chief Complaint Information obtained from Patient Bilateral LE ulcers and lymphedema History of Present Illness (HPI) 07-16-2022 upon evaluation today patient appears to be doing poorly currently in regard to his his legs. The left leg is actually worse than the right. This is a patient that is previously been seen in the [place] office and at that point was doing really quite well with compression wrapping and often alginate are either Hydrofera Blue dressings. Fortunately he has gone since November 2020 until just current with out having any issue or need for wound care services which is great news. Unfortunately he is here today because he is having some issues at this point. Patient does have a history of several medical conditions which include diabetes mellitus type 2, lymphedema, hypertension, congestive heart failure, and cardiac arrhythmia though is not sure that this is actually atrial fibrillation based on what he has been told. He does have lymphedema pumps but tells me he has not used them since the summer when he had to move to a remodel of his building and subsequently never unpacked them. 07-23-2022 upon evaluation today patient appears to be doing well with regard to his legs. He is going require some sharp debridement today but overall seems to be making some progress. I am pleased in that regard he has much less drainage that he had did last week I think the compression wraps are definitely helping. 07-30-2022 upon evaluation today patient appears to be doing poorly in regard to his legs he actually has blue-green drainage which I think is consistent with Pseudomonas. I believe that we need to address this as soon as possible and I discussed that with the patient today as well. Fortunately I do not see any signs of active infection locally nor  systemically at this time which is great news. No fevers, chills, nausea, vomiting, or diarrhea. 08-13-2022 upon evaluation today patient appears to be doing well currently in regard to his leg ulcers which are actually looking much better. Fortunately there does not appear to be any signs of active infection at this time which is great news. No fevers, chills, nausea, vomiting, or diarrhea. 3/7; patient with predominantly a left medial lower extremity wound as well as several smaller areas and a small area on the right lateral. His Jodie Echevaria came in today. We are using silver alginate as the primary dressing  09-03-2022 upon evaluation today patient appears to be doing well currently in regard to his wounds. He has been tolerating the dressing changes without complication. Fortunately there does not appear to be any signs of infection there is needed however for sharp debridement. 09-10-2022 upon evaluation today patient appears to be doing well currently in regard to his wound. Has been tolerating the dressing changes without complication. Fortunately there does not appear to be any signs of infection looking systemically which is great news and overall I am extremely pleased with where we stand today. I do not see any signs of infection. 09-17-2022 upon evaluation today patient actually is making signs of improvement the good news is he is making good improvement. With that being said he still is having quite a time keeping the swelling down. We have been using Tubigrip I think that still probably our best option but nonetheless I think that we are making progress with regard to his wounds. 09-24-2022 upon evaluation today patient appears to be doing well currently in regard to his wounds is continuing to make improvements at this point which is great news and overall I am extremely pleased with where we stand today. Fortunately I do not see any evidence of active infection at this time which is great news  and in general I think he is doing quite well with the [facility] and silver alginate. 10-01-2022 upon evaluation today patient appears to be doing pretty well currently in regard to his wounds. He has been tolerating the dressing changes without complication. Fortunately I do not see any evidence of active infection locally nor systemically which is great news and I do believe that 10-08-2022 upon evaluation today patient's wounds actually are showing some signs of improvement which is great news. Still this is very slow to heal I really feel like he would do better if we can get him in some stronger compression wrapping he does have the juxta lite compression wraps therefore we can actually see about doing that over top of his Tubigrip to see if that can be beneficial. He does not love the hide he had as they are not the most comfortable thing for him but nonetheless I think it would be beneficial he is in agreement with going forward with this. Objective Constitutional Obese and well-hydrated in no acute distress. Vitals Time Taken: 7:37 AM, Height: 73 in, Weight: 358 lbs, BMI: 47.2, Temperature: 97.8 F, Pulse: 72 bpm, Respiratory Rate: 18 breaths/min, Blood Pressure: 128/78 mmHg. Respiratory normal breathing without difficulty. Psychiatric this patient is able to make decisions and demonstrates good insight into disease process. Alert and Oriented x 3. pleasant and cooperative. General Notes: Upon inspection patient's wound bed actually showed signs of the need for sharp debridement across the board form those debridements and postdebridement wound bed appears to be doing much better which is great news. I did Integumentary (Hair, Skin) Wound #2 status is Open. Original cause of wound was Gradually Appeared. The date acquired was: 06/15/2022. The wound has been in treatment 12 weeks. The wound is located on the Left,Circumferential Lower Leg. The wound measures 8cm length x 23.5cm width x 0.3cm  depth; 147.655cm^2 area and 44.296cm^3 volume. There is Fat Layer (Subcutaneous Tissue) exposed. There is no tunneling or undermining noted. There is a medium amount of serosanguineous drainage noted. There is medium (34-66%) red granulation within the wound bed. There is a medium (34-66%) amount of necrotic tissue within the wound bed. Assessment HARALAMBOS, YEATTS ([id number]) [id number] Page  6 of 7 Active Problems ICD-10 Type 2 diabetes mellitus with other skin ulcer Lymphedema, not elsewhere classified Non-pressure chronic ulcer of other part of left lower leg with fat layer exposed Non-pressure chronic ulcer of other part of right lower leg with fat layer exposed Essential (primary) hypertension Chronic combined systolic (congestive) and diastolic (congestive) heart failure Cardiac arrhythmia, unspecified Procedures Wound #2 Pre-procedure diagnosis of Wound #2 is a Diabetic Wound/Ulcer of the Lower Extremity located on the Left,Circumferential Lower Leg .Severity of Tissue Pre Debridement is: Fat layer exposed. There was a Excisional Skin/Subcutaneous Tissue Debridement with a total area of 7.38 sq cm performed by Allen Derry, PA-C. With the following instrument(s): Curette to remove Viable and Non-Viable tissue/material. Material removed includes Subcutaneous Tissue and Slough and after achieving pain control using Lidocaine 4% Topical Solution. No specimens were taken. A time out was conducted at 08:07, prior to the start of the procedure. A Moderate amount of bleeding was controlled with Pressure. The procedure was tolerated well. Post Debridement Measurements: 8cm length x 23.5cm width x 0.3cm depth; 44.296cm^3 volume. Character of Wound/Ulcer Post Debridement is stable. Severity of Tissue Post Debridement is: Fat layer exposed. Post procedure Diagnosis Wound #2: Same as Pre-Procedure Plan Follow-up Appointments: Return Appointment in 1 week. Nurse Visit  as needed Bathing/ Shower/ Hygiene: May shower with wound dressing protected with water repellent cover or cast protector. Anesthetic (Use 'Patient Medications' Section for Anesthetic Order Entry): Lidocaine applied to wound bed Edema Control - Lymphedema / Segmental Compressive Device / Other: Tubigrip single layer applied. - wear juxtalite over top of tubi grip Elevate, Exercise Daily and Avoid Standing for Long Periods of Time. Elevate legs to the level of the heart and pump ankles as often as possible Elevate leg(s) parallel to the floor when sitting. Compression Pump: Use compression pump on left lower extremity for 60 minutes, twice daily. Compression Pump: Use compression pump on right lower extremity for 60 minutes, twice daily. DO YOUR BEST to sleep in the bed at night. DO NOT sleep in your recliner. Long hours of sitting in a recliner leads to swelling of the legs and/or potential wounds on your backside. WOUND #2: - Lower Leg Wound Laterality: Left, Circumferential Cleanser: Soap and Water 3 x Per Week/30 Days Discharge Instructions: Gently cleanse wound with antibacterial soap, rinse and pat dry prior to dressing wounds Topical: Keystone gel 3 x Per Week/30 Days Prim Dressing: AquacelAg Advantage Dressing, 4x5 (in/in) 3 x Per Week/30 Days ary Discharge Instructions: Apply to wound as directed Secondary Dressing: ABD Pad 5x9 (in/in) 3 x Per Week/30 Days Discharge Instructions: Cover with ABD pad Secured With: Tubigrip Size G, 4.5x10 (in/yd) 3 x Per Week/30 Days Discharge Instructions: single layer 1. I am going to recommend based on what we are seeing that we have the patient going to continue currently with the wound care measures as before he is in agreement the plan. The change we will make will still continue with the Tubigrip that they are working to subsequently utilize the juxta lite compression wraps over top of that. 2. I am good recommend as well that we have the  patient continue to monitor for any signs of infection or worsening. Obviously based on what I am seeing I think that he is making pretty good progress but at the same time we will keep a close eye on things from an infection standpoint he is currently utilizing the Aquacel Ag which does seem to do well without burning him. We  will see patient back for reevaluation in 1 week here in the clinic. If anything worsens or changes patient will contact our office for additional recommendations. Electronic Signature(s) Signed: 10/08/2022 5:47:20 PM By: Allen Derry PA-C Entered By: Allen Derry on 10/08/2022 17:47:20 SuperBill Details -------------------------------------------------------------------------------- [name] ([id number]) [id number] Page 7 of 7 Patient Name: Date of Service: [name] 10/08/2022 Medical Record Number: [id number] Patient Account Number: [id number] Date of Birth/Sex: Treating RN: 01-17-1956 (67 y.o. [name] Primary Care Provider: [name] Other Clinician: Referring Provider: Treating Provider/Extender: Matthew Folks in Treatment: 12 Diagnosis Coding ICD-10 Codes Code Description E11.622 Type 2 diabetes mellitus with other skin ulcer I89.0 Lymphedema, not elsewhere classified L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 L97.812 Non-pressure chronic ulcer of other part of right lower leg with fat layer exposed I10 Essential (primary) hypertension I50.42 Chronic combined systolic (congestive) and diastolic (congestive) heart failure I49.9 Cardiac arrhythmia, unspecified Facility Procedures CPT4 Code Description Modifier Quantity [id number] 11042 - DEB SUBQ TISSUE 20 SQ CM/< 1 ICD-10 Diagnosis Description L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 Physician Procedures Quantity CPT4 Code Description  Modifier [id number] 11042 - WC PHYS SUBQ TISS 20 SQ CM 1 ICD-10 Diagnosis Description L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 Electronic Signature(s) Signed: 10/08/2022 5:47:43 PM By: Allen Derry PA-C Entered By: Allen Derry on 10/08/2022 17:47:43

## 2022-10-09 NOTE — Progress Notes (Signed)
[name], [name] ([id number]) [id number] Page 1 of 7 Visit Report for 10/08/2022 Arrival Information Details Patient Name: Date of Service: [name] 10/08/2022 7:30 A M Medical Record Number: [id number] Patient Account Number: [id number] Date of Birth/Sex: Treating RN: [birth year] (67 y.o. [name] Primary Care Edithe Dobbin: [name] Other Clinician: Referring Baudelio Karnes: Treating Thanh Pomerleau/Extender: Matthew Folks in Treatment: 12 Visit Information History Since Last Visit Added or deleted any medications: No Patient Arrived: [name] Any new allergies or adverse reactions: No Arrival Time: 07:34 Had a fall or experienced change in No Transfer Assistance: None activities of daily living that may affect Patient Identification Verified: Yes risk of falls: Secondary Verification Process Completed: Yes Hospitalized since last visit: No Patient Requires Transmission-Based Precautions: No Has Dressing in Place as Prescribed: Yes Patient Has Alerts: No Has Compression in Place as Prescribed: Yes Pain Present Now: No Electronic Signature(s) Signed: 10/08/2022 4:36:23 PM By: Angelina Pih Entered By: Angelina Pih on 10/08/2022 07:37:57 -------------------------------------------------------------------------------- Clinic Level of Care Assessment Details Patient Name: Date of Service: [name] 10/08/2022 7:30 A M Medical Record Number: [id number] Patient Account Number: [id number] Date of Birth/Sex: Treating RN: 1956/01/09 (67 y.o. [name] Primary Care Layten Aiken: [name] Other Clinician: Referring Laylaa Guevarra: Treating Niala Stcharles/Extender: Matthew Folks in Treatment: 12 Clinic Level of Care Assessment Items TOOL 1 Quantity Score []  - 0 Use when EandM and Procedure is performed on INITIAL visit ASSESSMENTS - Nursing Assessment / Reassessment []  - 0 General Physical Exam (combine  w/ comprehensive assessment (listed just below) when performed on new pt. evals) []  - 0 Comprehensive Assessment (HX, ROS, Risk Assessments, Wounds Hx, etc.) ASSESSMENTS - Wound and Skin Assessment / Reassessment []  - 0 Dermatologic / Skin Assessment (not related to wound area) ASSESSMENTS - Ostomy and/or Continence Assessment and Care []  - 0 Incontinence Assessment and Management []  - 0 Ostomy Care Assessment and Management (repouching, etc.) PROCESS - Coordination of Care []  - 0 Simple Patient / Family Education for ongoing care []  - 0 Complex (extensive) Patient / Family Education for ongoing care []  - 0 Staff obtains Chiropractor, Records, T Results / Process Orders est []  - 0 Staff telephones HHA, Nursing Homes / Clarify orders / etc []  - 0 Routine Transfer to another Facility (non-emergent condition) []  - 0 Routine Hospital Admission (non-emergent condition) []  - 0 New Admissions / Manufacturing engineer / Ordering NPWT Apligraf, etc. , []  - 0 Emergency Hospital Admission (emergent condition) PROCESS - Special Needs [name], [name] ([id number]) [phone].pdf Page 2 of 7 []  - 0 Pediatric / Minor Patient Management []  - 0 Isolation Patient Management []  - 0 Hearing / Language / Visual special needs []  - 0 Assessment of Community assistance (transportation, D/C planning, etc.) []  - 0 Additional assistance / Altered mentation []  - 0 Support Surface(s) Assessment (bed, cushion, seat, etc.) INTERVENTIONS - Miscellaneous []  - 0 External ear exam []  - 0 Patient Transfer (multiple staff / Nurse, adult / Similar devices) []  - 0 Simple Staple / Suture removal (25 or less) []  - 0 Complex Staple / Suture removal (26 or more) []  - 0 Hypo/Hyperglycemic Management (do not check if billed separately) []  - 0 Ankle / Brachial Index (ABI) - do not check if billed separately Has the patient been seen at the hospital within the last three years: Yes Total  Score: 0 Level Of Care: ____ Electronic Signature(s) Signed: 10/08/2022 4:36:23 PM By: Angelina Pih Entered By: Roger Shelter,  Caitlin on 10/08/2022 08:36:30 -------------------------------------------------------------------------------- Encounter Discharge Information Details Patient Name: Date of Service: [name] 10/08/2022 7:30 A M Medical Record Number: [id number] Patient Account Number: [id number] Date of Birth/Sex: Treating RN: 03-Jul-1955 (67 y.o. [name] Primary Care Earnest Mcgillis: [name] Other Clinician: Referring Sencere Symonette: Treating Jleigh Striplin/Extender: Matthew Folks in Treatment: 12 Encounter Discharge Information Items Post Procedure Vitals Discharge Condition: Stable Temperature (F): 97.8 Ambulatory Status: Cane Pulse (bpm): 72 Discharge Destination: Home Respiratory Rate (breaths/min): 18 Transportation: Private Auto Blood Pressure (mmHg): 128/78 Accompanied By: spouse Schedule Follow-up Appointment: Yes Clinical Summary of Care: Electronic Signature(s) Signed: 10/08/2022 11:11:22 AM By: Angelina Pih Entered By: Angelina Pih on 10/08/2022 11:11:22 -------------------------------------------------------------------------------- Lower Extremity Assessment Details Patient Name: Date of Service: [name] 10/08/2022 7:30 A M Medical Record Number: [id number] Patient Account Number: [id number] Date of Birth/Sex: Treating RN: 02/04/1956 (67 y.o. [name] Primary Care Lakelyn Straus: [name] Other Clinician: Referring Leyda Vanderwerf: Treating Tiegan Jambor/Extender: Matthew Folks in Treatment: 12 Edema Assessment Assessed: [name]: No] [name]: No] [Left: Edema] [name]: :] [address] [name], [name] ([id number]) [id number] Page 3 of 7 Left: Right: Point of Measurement: 35 cm From Medial Instep 56 cm Ankle Left: Right: Point of Measurement: 10 cm From Medial Instep 31.5  cm Vascular Assessment Pulses: Dorsalis Pedis Palpable: [Left:Yes] Posterior Tibial Doppler Audible: [Left:Yes] Electronic Signature(s) Signed: 10/08/2022 4:36:23 PM By: Angelina Pih Entered By: Angelina Pih on 10/08/2022 07:55:47 -------------------------------------------------------------------------------- Multi Wound Chart Details Patient Name: Date of Service: Matthew Burly [name] 10/08/2022 7:30 A M Medical Record Number: [id number] Patient Account Number: [id number] Date of Birth/Sex: Treating RN: December 15, 1955 (67 y.o. [name] Primary Care Andersen Mckiver: [name] Other Clinician: Referring Mckaylee Dimalanta: Treating Nael Petrosyan/Extender: Matthew Folks in Treatment: 12 Vital Signs Height(in): 73 Pulse(bpm): 72 Weight(lbs): 358 Blood Pressure(mmHg): 128/78 Body Mass Index(BMI): 47.2 Temperature(F): 97.8 Respiratory Rate(breaths/min): 18 [2:Photos:] [N/A:N/A] Left, Circumferential Lower Leg N/A N/A Wound Location: Gradually Appeared N/A N/A Wounding Event: Diabetic Wound/Ulcer of the Lower N/A N/A Primary Etiology: Extremity Arrhythmia, Congestive Heart Failure, N/A N/A Comorbid History: Hypertension, Type II Diabetes 06/15/2022 N/A N/A Date Acquired: 12 N/A N/A Weeks of Treatment: Open N/A N/A Wound Status: No N/A N/A Wound Recurrence: 7x23.5x0.3 N/A N/A Measurements L x W x D (cm) 129.198 N/A N/A A (cm) : rea 38.759 N/A N/A Volume (cm) : 78.50% N/A N/A % Reduction in Area: 35.50% N/A N/A % Reduction in Volume: Grade 1 N/A N/A Classification: [name], [name] ([id number]) [id number] Page 4 of 7 Medium N/A N/A Exudate Amount: Serosanguineous N/A N/A Exudate Type: red, brown N/A N/A Exudate Color: Medium (34-66%) N/A N/A Granulation Amount: Red N/A N/A Granulation Quality: Medium (34-66%) N/A N/A Necrotic Amount: Fat Layer (Subcutaneous Tissue): Yes N/A N/A Exposed Structures: Fascia:  No Tendon: No Muscle: No Joint: No Bone: No None N/A N/A Epithelialization: Treatment Notes Electronic Signature(s) Signed: 10/08/2022 4:36:23 PM By: Angelina Pih Entered By: Angelina Pih on 10/08/2022 08:07:31 -------------------------------------------------------------------------------- Multi-Disciplinary Care Plan Details Patient Name: Date of Service: Matthew Burly [name] 10/08/2022 7:30 A M Medical Record Number: [id number] Patient Account Number: [id number] Date of Birth/Sex: Treating RN: 1955/09/26 (67 y.o. [name] Primary Care Gibril Mastro: [name] Other Clinician: Referring Wilberto Console: Treating Avonda Toso/Extender: Matthew Folks in Treatment: 12 Active Inactive Wound/Skin Impairment Nursing Diagnoses: Knowledge deficit related to ulceration/compromised skin integrity Goals: Patient/caregiver will verbalize understanding of skin care regimen Date Initiated: 07/16/2022 Target Resolution Date:  10/24/2022 Goal Status: Active Ulcer/skin breakdown will have a volume reduction of 30% by week 4 Date Initiated: 07/16/2022 Date Inactivated: 09/10/2022 Target Resolution Date: 08/14/2022 Goal Status: Unmet Unmet Reason: comorbidties Ulcer/skin breakdown will have a volume reduction of 50% by week 8 Date Initiated: 07/16/2022 Date Inactivated: 09/24/2022 Target Resolution Date: 09/14/2022 Goal Status: Unmet Unmet Reason: comorbidities Ulcer/skin breakdown will have a volume reduction of 80% by week 12 Date Initiated: 07/16/2022 Target Resolution Date: 10/14/2022 Goal Status: Active Ulcer/skin breakdown will heal within 14 weeks Date Initiated: 07/16/2022 Target Resolution Date: 11/14/2022 Goal Status: Active Interventions: Assess patient/caregiver ability to obtain necessary supplies Assess patient/caregiver ability to perform ulcer/skin care regimen upon admission and as needed Assess ulceration(s) every visit Notes: Electronic Signature(s) Signed:  10/08/2022 11:08:55 AM By: Angelina Pih Entered By: Angelina Pih on 10/08/2022 11:08:55 -------------------------------------------------------------------------------- Pain Assessment Details Patient Name: Date of Service: Matthew Burly [name] 10/08/2022 7:30 A Jennette Kettle ([id number]) [id number] Page 5 of 7 Medical Record Number: [id number] Patient Account Number: [id number] Date of Birth/Sex: Treating RN: 22-Apr-1956 (67 y.o. [name] Primary Care Aydien Majette: [name] Other Clinician: Referring Keon Pender: Treating Oni Dietzman/Extender: Matthew Folks in Treatment: 12 Active Problems Location of Pain Severity and Description of Pain Patient Has Paino No Site Locations Rate the pain. Current Pain Level: 0 Pain Management and Medication Current Pain Management: Electronic Signature(s) Signed: 10/08/2022 4:36:23 PM By: Angelina Pih Entered By: Angelina Pih on 10/08/2022 07:41:52 -------------------------------------------------------------------------------- Patient/Caregiver Education Details Patient Name: Date of Service: [name] 4/25/2024andnbsp7:30 A M Medical Record Number: [id number] Patient Account Number: [id number] Date of Birth/Gender: Treating RN: 12/15/1955 (67 y.o. [name] Primary Care Physician: [name] Other Clinician: Referring Physician: Treating Physician/Extender: Matthew Folks in Treatment: 12 Education Assessment Education Provided To: Patient and Caregiver Education Topics Provided Wound/Skin Impairment: Handouts: Caring for Your Ulcer Methods: Explain/Verbal Responses: State content correctly Electronic Signature(s) Signed: 10/08/2022 4:36:23 PM By: Angelina Pih Entered By: Angelina Pih on 10/08/2022 11:09:05 -------------------------------------------------------------------------------- Wound Assessment Details Patient Name:  Date of Service: Matthew Burly [name] 10/08/2022 7:30 A M Medical Record Number: [id number] Patient Account Number: [id number] [name], [name] ([id number]) [phone].pdf Page 6 of 7 Date of Birth/Sex: Treating RN: 03-06-1956 (67 y.o. [name] Primary Care Tiffini Blacksher: Other Clinician: [name] Referring Sofie Schendel: Treating Chapel Silverthorn/Extender: Matthew Folks in Treatment: 12 Wound Status Wound Number: 2 Primary Diabetic Wound/Ulcer of the Lower Extremity Etiology: Wound Location: Left, Circumferential Lower Leg Wound Status: Open Wounding Event: Gradually Appeared Comorbid Arrhythmia, Congestive Heart Failure, Hypertension, Type II Date Acquired: 06/15/2022 History: Diabetes Weeks Of Treatment: 12 Clustered Wound: No Photos Wound Measurements Length: (cm) 8 Width: (cm) 23.5 Depth: (cm) 0.3 Area: (cm) 147.655 Volume: (cm) 44.296 % Reduction in Area: 75.4% % Reduction in Volume: 26.3% Epithelialization: None Tunneling: No Undermining: No Wound Description Classification: Grade 1 Exudate Amount: Medium Exudate Type: Serosanguineous Exudate Color: red, brown Foul Odor After Cleansing: No Slough/Fibrino Yes Wound Bed Granulation Amount: Medium (34-66%) Exposed Structure Granulation Quality: Red Fascia Exposed: No Necrotic Amount: Medium (34-66%) Fat Layer (Subcutaneous Tissue) Exposed: Yes Tendon Exposed: No Muscle Exposed: No Joint Exposed: No Bone Exposed: No Treatment Notes Wound #2 (Lower Leg) Wound Laterality: Left, Circumferential Cleanser Soap and Water Discharge Instruction: Gently cleanse wound with antibacterial soap, rinse and pat dry prior to dressing wounds Peri-Wound Care Topical Keystone gel Primary Dressing AquacelAg Advantage Dressing, 4x5 (in/in) Discharge Instruction: Apply to wound as  directed Secondary Dressing ABD Pad 5x9 (in/in) Discharge Instruction: Cover with ABD pad Secured  With Tubigrip Size G, 4.5x10 (in/yd) Discharge Instruction: single layer RUDY, LUHMANN ([id number]) [phone].pdf Page 7 of 7 Compression Wrap Compression Stockings Add-Ons Electronic Signature(s) Signed: 10/08/2022 4:36:23 PM By: Angelina Pih Entered By: Angelina Pih on 10/08/2022 08:09:08 -------------------------------------------------------------------------------- Vitals Details Patient Name: Date of Service: [name], [name] 10/08/2022 7:30 A M Medical Record Number: [id number] Patient Account Number: [id number] Date of Birth/Sex: Treating RN: 04/19/1956 (67 y.o. [name] Primary Care Chuong Casebeer: [name] Other Clinician: Referring Catrena Vari: Treating Jashanti Clinkscale/Extender: Matthew Folks in Treatment: 12 Vital Signs Time Taken: 07:37 Temperature (F): 97.8 Height (in): 73 Pulse (bpm): 72 Weight (lbs): 358 Respiratory Rate (breaths/min): 18 Body Mass Index (BMI): 47.2 Blood Pressure (mmHg): 128/78 Reference Range: 80 - 120 mg / dl Electronic Signature(s) Signed: 10/08/2022 4:36:23 PM By: Angelina Pih Entered By: Angelina Pih on 10/08/2022 07:40:36

## 2022-10-15 ENCOUNTER — Encounter: Payer: PPO | Attending: Physician Assistant | Admitting: Physician Assistant

## 2022-10-15 DIAGNOSIS — I110 Hypertensive heart disease with heart failure: Secondary | ICD-10-CM | POA: Insufficient documentation

## 2022-10-15 DIAGNOSIS — L97812 Non-pressure chronic ulcer of other part of right lower leg with fat layer exposed: Secondary | ICD-10-CM | POA: Insufficient documentation

## 2022-10-15 DIAGNOSIS — E11622 Type 2 diabetes mellitus with other skin ulcer: Secondary | ICD-10-CM | POA: Diagnosis not present

## 2022-10-15 DIAGNOSIS — I890 Lymphedema, not elsewhere classified: Secondary | ICD-10-CM | POA: Insufficient documentation

## 2022-10-15 DIAGNOSIS — L97822 Non-pressure chronic ulcer of other part of left lower leg with fat layer exposed: Secondary | ICD-10-CM | POA: Diagnosis not present

## 2022-10-15 DIAGNOSIS — I5042 Chronic combined systolic (congestive) and diastolic (congestive) heart failure: Secondary | ICD-10-CM | POA: Diagnosis not present

## 2022-10-16 NOTE — Progress Notes (Addendum)
NUNO, VANGENDEREN ([id number]) [id number] Page 1 of 6 Visit Report for 10/15/2022 Chief Complaint Document Details Patient Name: Date of Service: [name] 10/15/2022 12:45 PM Medical Record Number: [id number] Patient Account Number: [id number] Date of Birth/Sex: Treating RN: 29-May-1956 (67 y.o. [name] Primary Care Provider: [name] Other Clinician: Referring Provider: Treating Provider/Extender: Matthew Folks in Treatment: 13 Information Obtained from: Patient Chief Complaint Bilateral LE ulcers and lymphedema Electronic Signature(s) Signed: 10/15/2022 12:47:42 PM By: Allen Derry PA-C Entered By: Allen Derry on 10/15/2022 12:47:42 -------------------------------------------------------------------------------- HPI Details Patient Name: Date of Service: [name], [name] 10/15/2022 12:45 PM Medical Record Number: [id number] Patient Account Number: [id number] Date of Birth/Sex: Treating RN: 1956-03-25 (67 y.o. [name] Primary Care Provider: [name] Other Clinician: Referring Provider: Treating Provider/Extender: Matthew Folks in Treatment: 13 History of Present Illness HPI Description: 07-16-2022 upon evaluation today patient appears to be doing poorly currently in regard to his his legs. The left leg is actually worse than the right. This is a patient that is previously been seen in the [place] office and at that point was doing really quite well with compression wrapping and often alginate are either Hydrofera Blue dressings. Fortunately he has gone since November 2020 until just current with out having any issue or need for wound care services which is great news. Unfortunately he is here today because he is having some issues at this point. Patient does have a history of several medical conditions which include diabetes mellitus type 2, lymphedema, hypertension, congestive heart  failure, and cardiac arrhythmia though is not sure that this is actually atrial fibrillation based on what he has been told. He does have lymphedema pumps but tells me he has not used them since the summer when he had to move to a remodel of his building and subsequently never unpacked them. 07-23-2022 upon evaluation today patient appears to be doing well with regard to his legs. He is going require some sharp debridement today but overall seems to be making some progress. I am pleased in that regard he has much less drainage that he had did last week I think the compression wraps are definitely helping. 07-30-2022 upon evaluation today patient appears to be doing poorly in regard to his legs he actually has blue-green drainage which I think is consistent with Pseudomonas. I believe that we need to address this as soon as possible and I discussed that with the patient today as well. Fortunately I do not see any signs of active infection locally nor systemically at this time which is great news. No fevers, chills, nausea, vomiting, or diarrhea. 08-13-2022 upon evaluation today patient appears to be doing well currently in regard to his leg ulcers which are actually looking much better. Fortunately there does not appear to be any signs of active infection at this time which is great news. No fevers, chills, nausea, vomiting, or diarrhea. 3/7; patient with predominantly a left medial lower extremity wound as well as several smaller areas and a small area on the right lateral. His Jodie Echevaria came in today. We are using silver alginate as the primary dressing 09-03-2022 upon evaluation today patient appears to be doing well currently in regard to his wounds. He has been tolerating the dressing changes without complication. Fortunately there does not appear to be any signs of infection there is needed however for sharp debridement. 09-10-2022 upon evaluation today patient appears to be doing well currently  in  regard to his wound. Has been tolerating the dressing changes without complication. Fortunately there does not appear to be any signs of infection looking systemically which is great news and overall I am extremely pleased with where we stand today. I do not see any signs of infection. 09-17-2022 upon evaluation today patient actually is making signs of improvement the good news is he is making good improvement. With that being said he still is having quite a time keeping the swelling down. We have been using Tubigrip I think that still probably our best option but nonetheless I think that we are making progress with regard to his wounds. 09-24-2022 upon evaluation today patient appears to be doing well currently in regard to his wounds is continuing to make improvements at this point which is great news and overall I am extremely pleased with where we stand today. Fortunately I do not see any evidence of active infection at this time which is great news and in general I think he is doing quite well with the [facility] and silver alginate. 10-01-2022 upon evaluation today patient appears to be doing pretty well currently in regard to his wounds. He has been tolerating the dressing changes without complication. Fortunately I do not see any evidence of active infection locally nor systemically which is great news and I do believe that 10-08-2022 upon evaluation today patient's wounds actually are showing some signs of improvement which is great news. Still this is very slow to heal I really LEEANTHONY, MCBANE ([id number]) [phone].pdf Page 2 of 6 feel like he would do better if we can get him in some stronger compression wrapping he does have the juxta lite compression wraps therefore we can actually see about doing that over top of his Tubigrip to see if that can be beneficial. He does not love the hide he had as they are not the most comfortable thing for him but nonetheless I think  it would be beneficial he is in agreement with going forward with this. 10-15-2022 upon evaluation today patient appears to be doing well currently in regard to his wounds that do not appear to be infected which is good news. With that being said unfortunately he still continues to have significant amount of swelling we really need to do something to try to get the swelling under control. I do not see any signs of infection locally nor systemically but at the same time the amount of edema is tremendous. The juxta lite helps but again he is only taking the fluid pills once or twice a week due to the amount that makes him go to the restroom. Therefore he does not take this daily which is really what he needs on a more regular basis. We need to try to get some compression on and is a little bit stronger. Electronic Signature(s) Signed: 10/15/2022 1:18:16 PM By: Allen Derry PA-C Entered By: Allen Derry on 10/15/2022 13:18:16 -------------------------------------------------------------------------------- Physical Exam Details Patient Name: Date of Service: [name] 10/15/2022 12:45 PM Medical Record Number: [id number] Patient Account Number: [id number] Date of Birth/Sex: Treating RN: January 14, 1956 (68 y.o. [name] Primary Care Provider: [name] Other Clinician: Referring Provider: Treating Provider/Extender: Matthew Folks in Treatment: 13 Constitutional Obese and well-hydrated in no acute distress. Respiratory normal breathing without difficulty. Psychiatric this patient is able to make decisions and demonstrates good insight into disease process. Alert and Oriented x 3. pleasant and cooperative. Notes Upon inspection patient's wound bed  actually showed signs of poor granulation and epithelization at this point. He does not seem to have a lot of necrotic tissue noted but unfortunately has not seen a lot of the improvement either that we are really hoping for.  We tried the Tubigrip and the juxta lite is done okay and has got some the swelling down but not as much as we really need to be perfectly honest. Electronic Signature(s) Signed: 10/15/2022 1:18:59 PM By: Allen Derry PA-C Entered By: Allen Derry on 10/15/2022 13:18:58 -------------------------------------------------------------------------------- Physician Orders Details Patient Name: Date of Service: [name] 10/15/2022 12:45 PM Medical Record Number: [id number] Patient Account Number: [id number] Date of Birth/Sex: Treating RN: [birth year] (67 y.o. [name], [name] Primary Care Provider: [name] Other Clinician: Referring Provider: Treating Provider/Extender: Matthew Folks in Treatment: 13 Verbal / Phone Orders: No Diagnosis Coding ICD-10 Coding Code Description E11.622 Type 2 diabetes mellitus with other skin ulcer I89.0 Lymphedema, not elsewhere classified L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 L97.812 Non-pressure chronic ulcer of other part of right lower leg with fat layer exposed I10 Essential (primary) hypertension I50.42 Chronic combined systolic (congestive) and diastolic (congestive) heart failure I49.9 Cardiac arrhythmia, unspecified Follow-up Appointments Return Appointment in 1 week. Nurse Visit as needed HAYGEN, BROCK ([id number]) [id number] Page 3 of 6 Bathing/ Shower/ Hygiene May shower with wound dressing protected with water repellent cover or cast protector. Anesthetic (Use 'Patient Medications' Section for Anesthetic Order Entry) Lidocaine applied to wound bed Edema Control - Lymphedema / Segmental Compressive Device / Other Elevate, Exercise Daily and A void Standing for Long Periods of Time. Elevate legs to the level of the heart and pump ankles as often as possible Elevate leg(s) parallel to the floor when sitting. Compression Pump: Use  compression pump on left lower extremity for 60 minutes, twice daily. Compression Pump: Use compression pump on right lower extremity for 60 minutes, twice daily. DO YOUR BEST to sleep in the bed at night. DO NOT sleep in your recliner. Long hours of sitting in a recliner leads to swelling of the legs and/or potential wounds on your backside. Wound Treatment Wound #2 - Lower Leg Wound Laterality: Left, Circumferential Cleanser: Soap and Water 2 x Per Week/30 Days Discharge Instructions: Gently cleanse wound with antibacterial soap, rinse and pat dry prior to dressing wounds Peri-Wound Care: AandD Ointment 2 x Per Week/30 Days Discharge Instructions: Apply AandD Ointment as directed AROUND TOP OF WRAP TO ANCHOR Prim Dressing: AquacelAg Advantage Dressing, 4x5 (in/in) 2 x Per Week/30 Days ary Discharge Instructions: Apply to wound as directed Secondary Dressing: ABD Pad 5x9 (in/in) 2 x Per Week/30 Days Discharge Instructions: Cover with ABD pad Compression Wrap: Urgo K2 Lite, two layer compression system, large 2 x Per Week/30 Days Electronic Signature(s) Signed: 10/15/2022 4:17:31 PM By: Allen Derry PA-C Signed: 10/15/2022 4:56:06 PM By: Elliot Gurney, [name], RN, [name], [name], [name] Entered By: Elliot Gurney, [name], RN, [name], [name] on 10/15/2022 13:30:06 -------------------------------------------------------------------------------- Problem List Details Patient Name: Date of Service: [name] 10/15/2022 12:45 PM Medical Record Number: [id number] Patient Account Number: [id number] Date of Birth/Sex: Treating RN: 1955/10/20 (67 y.o. [name] Primary Care Provider: [name] Other Clinician: Referring Provider: Treating Provider/Extender: Matthew Folks in Treatment: 13 Active Problems ICD-10 Encounter Code Description Active Date MDM Diagnosis E11.622 Type 2 diabetes mellitus with other skin ulcer 07/16/2022 No Yes I89.0 Lymphedema, not elsewhere classified 07/16/2022  No  Yes L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2/06/2022 No Yes L97.812 Non-pressure chronic ulcer of other part of right lower leg with fat layer 07/16/2022 No Yes exposed I10 Essential (primary) hypertension 07/16/2022 No Yes KIRKWOOD, NAVAS ([id number]) [id number] Page 4 of 6 I50.42 Chronic combined systolic (congestive) and diastolic (congestive) heart failure 07/16/2022 No Yes I49.9 Cardiac arrhythmia, unspecified 07/16/2022 No Yes Inactive Problems Resolved Problems Electronic Signature(s) Signed: 10/15/2022 12:47:34 PM By: Allen Derry PA-C Entered By: Allen Derry on 10/15/2022 12:47:34 -------------------------------------------------------------------------------- Progress Note Details Patient Name: Date of Service: [name] 10/15/2022 12:45 PM Medical Record Number: [id number] Patient Account Number: [id number] Date of Birth/Sex: Treating RN: May 29, 1956 (67 y.o. [name] Primary Care Provider: [name] Other Clinician: Referring Provider: Treating Provider/Extender: Matthew Folks in Treatment: 13 Subjective Chief Complaint Information obtained from Patient Bilateral LE ulcers and lymphedema History of Present Illness (HPI) 07-16-2022 upon evaluation today patient appears to be doing poorly currently in regard to his his legs. The left leg is actually worse than the right. This is a patient that is previously been seen in the [place] office and at that point was doing really quite well with compression wrapping and often alginate are either Hydrofera Blue dressings. Fortunately he has gone since November 2020 until just current with out having any issue or need for wound care services which is great news. Unfortunately he is here today because he is having some issues at this point. Patient does have a history of several medical conditions which include diabetes mellitus  type 2, lymphedema, hypertension, congestive heart failure, and cardiac arrhythmia though is not sure that this is actually atrial fibrillation based on what he has been told. He does have lymphedema pumps but tells me he has not used them since the summer when he had to move to a remodel of his building and subsequently never unpacked them. 07-23-2022 upon evaluation today patient appears to be doing well with regard to his legs. He is going require some sharp debridement today but overall seems to be making some progress. I am pleased in that regard he has much less drainage that he had did last week I think the compression wraps are definitely helping. 07-30-2022 upon evaluation today patient appears to be doing poorly in regard to his legs he actually has blue-green drainage which I think is consistent with Pseudomonas. I believe that we need to address this as soon as possible and I discussed that with the patient today as well. Fortunately I do not see any signs of active infection locally nor systemically at this time which is great news. No fevers, chills, nausea, vomiting, or diarrhea. 08-13-2022 upon evaluation today patient appears to be doing well currently in regard to his leg ulcers which are actually looking much better. Fortunately there does not appear to be any signs of active infection at this time which is great news. No fevers, chills, nausea, vomiting, or diarrhea. 3/7; patient with predominantly a left medial lower extremity wound as well as several smaller areas and a small area on the right lateral. His Jodie Echevaria came in today. We are using silver alginate as the primary dressing 09-03-2022 upon evaluation today patient appears to be doing well currently in regard to his wounds. He has been tolerating the dressing changes without complication. Fortunately there does not appear to be any signs of infection there is needed however for sharp debridement.  09-10-2022 upon evaluation today  patient appears to be doing well currently in regard to his wound. Has been tolerating the dressing changes without complication. Fortunately there does not appear to be any signs of infection looking systemically which is great news and overall I am extremely pleased with where we stand today. I do not see any signs of infection. 09-17-2022 upon evaluation today patient actually is making signs of improvement the good news is he is making good improvement. With that being said he still is having quite a time keeping the swelling down. We have been using Tubigrip I think that still probably our best option but nonetheless I think that we are making progress with regard to his wounds. 09-24-2022 upon evaluation today patient appears to be doing well currently in regard to his wounds is continuing to make improvements at this point which is great news and overall I am extremely pleased with where we stand today. Fortunately I do not see any evidence of active infection at this time which is great news and in general I think he is doing quite well with the [facility] and silver alginate. 10-01-2022 upon evaluation today patient appears to be doing pretty well currently in regard to his wounds. He has been tolerating the dressing changes without complication. Fortunately I do not see any evidence of active infection locally nor systemically which is great news and I do believe that 10-08-2022 upon evaluation today patient's wounds actually are showing some signs of improvement which is great news. Still this is very slow to heal I really feel like he would do better if we can get him in some stronger compression wrapping he does have the juxta lite compression wraps therefore we can actually see about doing that over top of his Tubigrip to see if that can be beneficial. He does not love the hide he had as they are not the most comfortable thing for him but nonetheless I think it would be beneficial he is in  agreement with going forward with this. ANDRIEL, VICINI ([id number]) [id number] Page 5 of 6 10-15-2022 upon evaluation today patient appears to be doing well currently in regard to his wounds that do not appear to be infected which is good news. With that being said unfortunately he still continues to have significant amount of swelling we really need to do something to try to get the swelling under control. I do not see any signs of infection locally nor systemically but at the same time the amount of edema is tremendous. The juxta lite helps but again he is only taking the fluid pills once or twice a week due to the amount that makes him go to the restroom. Therefore he does not take this daily which is really what he needs on a more regular basis. We need to try to get some compression on and is a little bit stronger. Objective Constitutional Obese and well-hydrated in no acute distress. Vitals Time Taken: 12:47 PM, Height: 73 in, Weight: 358 lbs, BMI: 47.2, Temperature: 97.5 F, Pulse: 72 bpm, Respiratory Rate: 18 breaths/min, Blood Pressure: 113/75 mmHg. Respiratory normal breathing without difficulty. Psychiatric this patient is able to make decisions and demonstrates good insight into disease process. Alert and Oriented x 3. pleasant and cooperative. General Notes: Upon inspection patient's wound bed actually showed signs of poor granulation and epithelization at this point. He does not seem to have a lot of necrotic tissue noted but unfortunately has not seen a lot of the  improvement either that we are really hoping for. We tried the Tubigrip and the juxta lite is done okay and has got some the swelling down but not as much as we really need to be perfectly honest. Integumentary (Hair, Skin) Wound #2 status is Open. Original cause of wound was Gradually Appeared. The date acquired was: 06/15/2022. The wound has been in treatment 13 weeks. The wound is located on  the Left,Circumferential Lower Leg. The wound measures 9cm length x 27cm width x 0.3cm depth; 190.852cm^2 area and 57.256cm^3 volume. There is Fat Layer (Subcutaneous Tissue) exposed. There is no tunneling or undermining noted. There is a medium amount of serosanguineous drainage noted. There is small (1-33%) red granulation within the wound bed. There is a large (67-100%) amount of necrotic tissue within the wound bed including Adherent Slough. Assessment Active Problems ICD-10 Type 2 diabetes mellitus with other skin ulcer Lymphedema, not elsewhere classified Non-pressure chronic ulcer of other part of left lower leg with fat layer exposed Non-pressure chronic ulcer of other part of right lower leg with fat layer exposed Essential (primary) hypertension Chronic combined systolic (congestive) and diastolic (congestive) heart failure Cardiac arrhythmia, unspecified Plan 1. I would recommend currently that we try the Urgo K2 lite compression wrap to see how this will do for him. The patient is in agreement the plan. Will see how things look going forward. 2. I am good recommend as well that the patient should continue to elevate his legs much as possible. Obviously the more this he can do the better and I am pleased in that regard. 3. I am also going to suggest that he should take the fluid pills as many days as possible. It would be best for him to take it daily but he tells me that is just not can be possible due to the fact that he has things that he has to do and he is not able to be tied down to going to the restroom as frequently as that would make him go. We will continue with the silver alginate dressing to go along with the compression wrap. Would not hold the [facility] for now. We will see patient back for reevaluation in 1 week here in the clinic. If anything worsens or changes patient will contact our office for additional recommendations. Electronic Signature(s) Signed: 10/15/2022  1:19:58 PM By: Allen Derry PA-C Entered By: Allen Derry on 10/15/2022 13:19:58 [name] ([id number]) [id number] Page 6 of 6 -------------------------------------------------------------------------------- SuperBill Details Patient Name: Date of Service: [name] 10/15/2022 Medical Record Number: [id number] Patient Account Number: [id number] Date of Birth/Sex: Treating RN: February 26, 1956 (67 y.o. [name], [name] Primary Care Provider: [name] Other Clinician: Referring Provider: Treating Provider/Extender: Matthew Folks in Treatment: 13 Diagnosis Coding ICD-10 Codes Code Description E11.622 Type 2 diabetes mellitus with other skin ulcer I89.0 Lymphedema, not elsewhere classified L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 L97.812 Non-pressure chronic ulcer of other part of right lower leg with fat layer exposed I10 Essential (primary) hypertension I50.42 Chronic combined systolic (congestive) and diastolic (congestive) heart failure I49.9 Cardiac arrhythmia, unspecified Facility Procedures : CPT4 Code: [id number] Description: (Facility Use Only) 29581LT - APPLY MULTLAY COMPRS LWR LT LEG Modifier: Quantity: 1 Physician Procedures : CPT4 Code Description Modifier [id number] - WC PHYS LEVEL 3 - EST PT ICD-10 Diagnosis Description E11.622 Type 2 diabetes mellitus with other skin ulcer I89.0 Lymphedema, not elsewhere classified L97.822 Non-pressure  chronic ulcer of other part of  left lower leg with fat layer exposed L97.812 Non-pressure chronic ulcer of other part of right lower leg with fat layer exposed Quantity: 1 Electronic Signature(s) Signed: 10/16/2022 2:22:33 PM By: Elliot Gurney, [name], RN, [name], [name], [name] Previous Signature: 10/15/2022 1:20:09 PM Version By: Allen Derry PA-C Entered By: Elliot Gurney [name], RN, [name], [name] on 10/16/2022 14:22:33

## 2022-10-16 NOTE — Progress Notes (Signed)
Alexandro, BRINKERHOFF ([id number]) [id number] Page 1 of 6 Visit Report for 10/15/2022 Arrival Information Details Patient Name: Date of Service: [name] 10/15/2022 12:45 PM Medical Record Number: [id number] Patient Account Number: [id number] Date of Birth/Sex: Treating RN: 29-Mar-1956 (67 y.o. [name] Primary Care [name]: [name] [name]: Matthew [name]: [name]: [name] in Treatment: 13 Visit Information History Since Last Visit Added or deleted any medications: No Patient Arrived: Crutches Has Dressing in Place as Prescribed: Yes Arrival Time: 12:45 Pain Present Now: No Accompanied By: self Transfer Assistance: None Patient Identification Verified: Yes Secondary Verification Process Completed: Yes Patient Requires Transmission-Based Precautions: No Patient Has Alerts: No Electronic Signature(s) Signed: 10/15/2022 4:56:06 PM By: [name], [name], RN, [name], [name], [name] Entered By: [name], [name], RN, [name], [name] on 10/15/2022 12:46:58 -------------------------------------------------------------------------------- Compression Therapy Details Patient Name: Date of Service: Matthew Macadam J. 10/15/2022 12:45 PM Medical Record Number: [id number] Patient Account Number: [id number] Date of Birth/Sex: Treating RN: 1956-03-25 (67 y.o. [name] Primary Care Debraann Livingstone: [name] [name]: Matthew Vraj Denardo: Matthew Jaydan Chretien/Extender: [name] in Treatment: 13 Compression Therapy Performed for Wound Assessment: Wound #2 Left,Circumferential Lower Leg Performed By: [name] Matthew Coventry, RN Compression Type: Double Layer Pre Treatment ABI: 0.9 Post Procedure Diagnosis Same as Pre-procedure Electronic Signature(s) Signed: 10/15/2022 4:56:06 PM By: [name], [name], RN, [name], [name], [name] Entered By: [name], [name], RN, [name], [name] on 10/15/2022  13:30:30 -------------------------------------------------------------------------------- Encounter Discharge Information Details Patient Name: Date of Service: Matthew Burly [name] 10/15/2022 12:45 PM Medical Record Number: [id number] Patient Account Number: [id number] Date of Birth/Sex: Treating RN: Dec 12, 1955 (67 y.o. [name] Primary Care Andrus Sharp: [name] [name]: Matthew Quinita Kostelecky: Matthew Demita Tobia/Extender: [name] in Treatment: 13 Encounter Discharge Information Items Discharge Condition: Stable Ambulatory Status: Ambulatory Discharge Destination: Home Transportation: Private Auto Schedule Follow-up Appointment: Yes Clinical Summary of Care: KENANIAH, BUSSARD ([id number]) [phone].pdf Page 2 of 6 Electronic Signature(s) Signed: 10/15/2022 4:56:06 PM By: [name], [name], RN, [name], [name], [name] Entered By: [name], [name], RN, [name], [name] on 10/15/2022 13:32:25 -------------------------------------------------------------------------------- Lower Extremity Assessment Details Patient Name: Date of Service: [name] 10/15/2022 12:45 PM Medical Record Number: [id number] Patient Account Number: [id number] Date of Birth/Sex: Treating RN: 03-May-1956 (67 y.o. [name] Primary Care Rocio Roam: [name] [name]: Matthew Kynesha Guerin: Matthew Jailyn Leeson/Extender: [name] in Treatment: 13 Edema Assessment Assessed: [name]: No] [name]: No] [Left: Edema] [Right: :] Calf Left: Right: Point of Measurement: 35 cm From Medial Instep 55 cm Ankle Left: Right: Point of Measurement: 10 cm From Medial Instep 31.2 cm Vascular Assessment Pulses: Dorsalis Pedis Palpable: [Left:Yes] Electronic Signature(s) Signed: 10/15/2022 4:56:06 PM By: [name], [name], RN, [name], [name], [name] Entered By: [name], [name], RN, [name], [name] on 10/15/2022  12:57:39 -------------------------------------------------------------------------------- Multi Wound Chart Details Patient Name: Date of Service: Matthew Burly [name] 10/15/2022 12:45 PM Medical Record Number: [id number] Patient Account Number: [id number] Date of Birth/Sex: Treating RN: 08/31/1955 (67 y.o. [name] Primary Care Jordie Skalsky: [name] [name]: Matthew Nikolus Marczak: Matthew Gracemarie Skeet/Extender: [name] in Treatment: 13 Vital Signs Height(in): 73 Pulse(bpm): 72 Weight(lbs): 358 Blood Pressure(mmHg): 113/75 Body Mass Index(BMI): 47.2 Temperature(F): 97.5 Respiratory Rate(breaths/min): 18 [2:Photos:] [N/A:N/A] Left, Circumferential Lower Leg N/A N/A Wound Location: Gradually Appeared N/A N/A Wounding Event: Diabetic Wound/Ulcer of the Lower N/A N/A Primary Etiology: Extremity Arrhythmia, Congestive Heart Failure, N/A  N/A Comorbid History: Hypertension, Type II Diabetes 06/15/2022 N/A N/A Date Acquired: 13 N/A N/A Weeks of Treatment: Open N/A N/A Wound Status: No N/A N/A Wound Recurrence: 9x27x0.3 N/A N/A Measurements L x W x D (cm) 190.852 N/A N/A A (cm) : rea 57.256 N/A N/A Volume (cm) : 68.20% N/A N/A % Reduction in A rea: 4.70% N/A N/A % Reduction in Volume: Grade 1 N/A N/A Classification: Medium N/A N/A Exudate A mount: Serosanguineous N/A N/A Exudate Type: red, brown N/A N/A Exudate Color: Small (1-33%) N/A N/A Granulation A mount: Red N/A N/A Granulation Quality: Large (67-100%) N/A N/A Necrotic A mount: Fat Layer (Subcutaneous Tissue): Yes N/A N/A Exposed Structures: Fascia: No Tendon: No Muscle: No Joint: No Bone: No None N/A N/A Epithelialization: Treatment Notes Electronic Signature(s) Signed: 10/15/2022 4:56:06 PM By: [name], [name], RN, [name], [name], [name] Entered By: [name], [name], RN, [name], [name] on 10/15/2022  13:10:37 -------------------------------------------------------------------------------- Multi-Disciplinary Care Plan Details Patient Name: Date of Service: Matthew Burly [name] 10/15/2022 12:45 PM Medical Record Number: [id number] Patient Account Number: [id number] Date of Birth/Sex: Treating RN: September 05, 1955 (67 y.o. [name] Primary Care Leahanna Buser: [name] [name]: Matthew Marget Outten: Matthew Margaret Staggs/Extender: [name] in Treatment: 13 Active Inactive Venous Leg Ulcer Nursing Diagnoses: Actual venous Insuffiency (use after diagnosis is confirmed) Goals: Patient will maintain optimal edema control Date Initiated: 10/15/2022 Target Resolution Date: 10/15/2022 Goal Status: Active Patient/caregiver will verbalize understanding of disease process and disease management Date Initiated: 10/15/2022 Target Resolution Date: 10/15/2022 Goal Status: Active Verify adequate tissue perfusion prior to therapeutic compression application Date Initiated: 10/15/2022 Target Resolution Date: 10/15/2022 Goal Status: Active Interventions: Assess peripheral edema status every visit. Compression as ordered ASHLYN, PAPP ([id number]) [id number] Page 4 of 6 Provide education on venous insufficiency Treatment Activities: Therapeutic compression applied : 10/15/2022 Notes: Wound/Skin Impairment Nursing Diagnoses: Knowledge deficit related to ulceration/compromised skin integrity Goals: Patient/caregiver will verbalize understanding of skin care regimen Date Initiated: 07/16/2022 Target Resolution Date: 10/24/2022 Goal Status: Active Ulcer/skin breakdown will have a volume reduction of 30% by week 4 Date Initiated: 07/16/2022 Date Inactivated: 09/10/2022 Target Resolution Date: 08/14/2022 Goal Status: Unmet Unmet Reason: comorbidties Ulcer/skin breakdown will have a volume reduction of 50% by week 8 Date Initiated: 07/16/2022 Date Inactivated:  09/24/2022 Target Resolution Date: 09/14/2022 Goal Status: Unmet Unmet Reason: comorbidities Ulcer/skin breakdown will have a volume reduction of 80% by week 12 Date Initiated: 07/16/2022 Target Resolution Date: 10/14/2022 Goal Status: Active Ulcer/skin breakdown will heal within 14 weeks Date Initiated: 07/16/2022 Target Resolution Date: 11/14/2022 Goal Status: Active Interventions: Assess patient/caregiver ability to obtain necessary supplies Assess patient/caregiver ability to perform ulcer/skin care regimen upon admission and as needed Assess ulceration(s) every visit Notes: Electronic Signature(s) Signed: 10/15/2022 4:56:06 PM By: [name], [name], RN, [name], [name], [name] Entered By: [name], [name], RN, [name], [name] on 10/15/2022 13:31:21 -------------------------------------------------------------------------------- Pain Assessment Details Patient Name: Date of Service: [name]. 10/15/2022 12:45 PM Medical Record Number: [id number] Patient Account Number: [id number] Date of Birth/Sex: Treating RN: 25-Apr-1956 (67 y.o. [name] Primary Care Robecca Fulgham: [name] [name]: Matthew Kammi Hechler: Matthew Dallen Bunte/Extender: [name] in Treatment: 13 Active Problems Location of Pain Severity and Description of Pain Patient Has Paino No Site Locations Pain Management and Medication SATCHEL, GRENDA ([id number]) [id number].pdf Page 5 of 6 Current Pain Management: Electronic Signature(s) Signed: 10/15/2022 4:56:06 PM By: [name], [name], RN, [name], [name], [name] Entered By: [name], [name], RN, [name], [name]  on 10/15/2022 12:50:10 -------------------------------------------------------------------------------- Patient/Caregiver Education Details Patient Name: Date of Service: [name] 5/2/2024andnbsp12:45 PM Medical Record Number: [id number] Patient Account Number: [id number] Date of Birth/Gender: Treating RN: 1956-03-30 (67 y.o. [name] Primary Care Physician: [name] [name]: Matthew Physician: Matthew Physician/Extender: [name] in Treatment: 13 Education Assessment Education Provided To: Patient Education Topics Provided Venous: Handouts: Controlling Swelling with Multilayered Compression Wraps Methods: Demonstration, Explain/Verbal Responses: State content correctly Wound/Skin Impairment: Handouts: Caring for Your Ulcer Methods: Demonstration, Explain/Verbal Responses: State content correctly Electronic Signature(s) Signed: 10/15/2022 4:56:06 PM By: [name], [name], RN, [name], [name], [name] Entered By: [name], [name], RN, [name], [name] on 10/15/2022 13:31:45 -------------------------------------------------------------------------------- Wound Assessment Details Patient Name: Date of Service: [name]. 10/15/2022 12:45 PM Medical Record Number: [id number] Patient Account Number: [id number] Date of Birth/Sex: Treating RN: Mar 29, 1956 (67 y.o. [name] Primary Care Makendra Vigeant: [name] [name]: Matthew Ashby Moskal: Matthew Dorlis Judice/Extender: [name] in Treatment: 13 Wound Status Wound Number: 2 Primary Diabetic Wound/Ulcer of the Lower Extremity Etiology: Wound Location: Left, Circumferential Lower Leg Wound Status: Open Wounding Event: Gradually Appeared Comorbid Arrhythmia, Congestive Heart Failure, Hypertension, Type II Date Acquired: 06/15/2022 History: Diabetes Weeks Of Treatment: 13 Clustered Wound: No Photos GILBERTO, BARSKI ([id number]) [id number] Page 6 of 6 Wound Measurements Length: (cm) 9 Width: (cm) 27 Depth: (cm) 0.3 Area: (cm) 190.852 Volume: (cm) 57.256 % Reduction in Area: 68.2% % Reduction in Volume: 4.7% Epithelialization: None Tunneling: No Undermining: No Wound Description Classification: Grade 1 Exudate Amount: Medium Exudate Type: Serosanguineous Exudate Color: red,  brown Foul Odor After Cleansing: No Slough/Fibrino Yes Wound Bed Granulation Amount: Small (1-33%) Exposed Structure Granulation Quality: Red Fascia Exposed: No Necrotic Amount: Large (67-100%) Fat Layer (Subcutaneous Tissue) Exposed: Yes Necrotic Quality: Adherent Slough Tendon Exposed: No Muscle Exposed: No Joint Exposed: No Bone Exposed: No Electronic Signature(s) Signed: 10/15/2022 4:56:06 PM By: [name], [name], RN, [name], [name], [name] Entered By: [name], [name], RN, [name], [name] on 10/15/2022 12:57:13 -------------------------------------------------------------------------------- Vitals Details Patient Name: Date of Service: Matthew Burly [name] 10/15/2022 12:45 PM Medical Record Number: [id number] Patient Account Number: [id number] Date of Birth/Sex: Treating RN: 1955-10-11 (67 y.o. [name] Primary Care Daila Elbert: [name] [name]: Matthew Joni Norrod: Matthew Atom Solivan/Extender: [name] in Treatment: 13 Vital Signs Time Taken: 12:47 Temperature (F): 97.5 Height (in): 73 Pulse (bpm): 72 Weight (lbs): 358 Respiratory Rate (breaths/min): 18 Body Mass Index (BMI): 47.2 Blood Pressure (mmHg): 113/75 Reference Range: 80 - 120 mg / dl Electronic Signature(s) Signed: 10/15/2022 4:56:06 PM By: [name], [name], RN, [name], [name], [name] Entered By: [name], [name], RN, [name], [name] on 10/15/2022 12:49:58

## 2022-10-19 ENCOUNTER — Ambulatory Visit: Payer: PPO

## 2022-10-21 DIAGNOSIS — Z794 Long term (current) use of insulin: Secondary | ICD-10-CM | POA: Diagnosis not present

## 2022-10-21 DIAGNOSIS — E1169 Type 2 diabetes mellitus with other specified complication: Secondary | ICD-10-CM | POA: Diagnosis not present

## 2022-10-21 DIAGNOSIS — Z1211 Encounter for screening for malignant neoplasm of colon: Secondary | ICD-10-CM | POA: Diagnosis not present

## 2022-10-21 DIAGNOSIS — E7800 Pure hypercholesterolemia, unspecified: Secondary | ICD-10-CM | POA: Diagnosis not present

## 2022-10-21 DIAGNOSIS — I509 Heart failure, unspecified: Secondary | ICD-10-CM | POA: Diagnosis not present

## 2022-10-21 DIAGNOSIS — Z9181 History of falling: Secondary | ICD-10-CM | POA: Diagnosis not present

## 2022-10-21 DIAGNOSIS — E11622 Type 2 diabetes mellitus with other skin ulcer: Secondary | ICD-10-CM | POA: Diagnosis not present

## 2022-10-21 DIAGNOSIS — Z5181 Encounter for therapeutic drug level monitoring: Secondary | ICD-10-CM | POA: Diagnosis not present

## 2022-10-21 DIAGNOSIS — Z0000 Encounter for general adult medical examination without abnormal findings: Secondary | ICD-10-CM | POA: Diagnosis not present

## 2022-10-21 DIAGNOSIS — Z139 Encounter for screening, unspecified: Secondary | ICD-10-CM | POA: Diagnosis not present

## 2022-10-21 DIAGNOSIS — I872 Venous insufficiency (chronic) (peripheral): Secondary | ICD-10-CM | POA: Diagnosis not present

## 2022-10-21 DIAGNOSIS — Z125 Encounter for screening for malignant neoplasm of prostate: Secondary | ICD-10-CM | POA: Diagnosis not present

## 2022-10-21 DIAGNOSIS — I10 Essential (primary) hypertension: Secondary | ICD-10-CM | POA: Diagnosis not present

## 2022-10-21 DIAGNOSIS — L97929 Non-pressure chronic ulcer of unspecified part of left lower leg with unspecified severity: Secondary | ICD-10-CM | POA: Diagnosis not present

## 2022-10-21 DIAGNOSIS — I428 Other cardiomyopathies: Secondary | ICD-10-CM | POA: Diagnosis not present

## 2022-10-21 DIAGNOSIS — Z1331 Encounter for screening for depression: Secondary | ICD-10-CM | POA: Diagnosis not present

## 2022-10-21 DIAGNOSIS — I110 Hypertensive heart disease with heart failure: Secondary | ICD-10-CM | POA: Diagnosis not present

## 2022-10-21 DIAGNOSIS — Z6841 Body Mass Index (BMI) 40.0 and over, adult: Secondary | ICD-10-CM | POA: Diagnosis not present

## 2022-10-21 DIAGNOSIS — Z23 Encounter for immunization: Secondary | ICD-10-CM | POA: Diagnosis not present

## 2022-10-22 ENCOUNTER — Encounter: Payer: PPO | Admitting: Physician Assistant

## 2022-10-22 DIAGNOSIS — E11622 Type 2 diabetes mellitus with other skin ulcer: Secondary | ICD-10-CM | POA: Diagnosis not present

## 2022-10-22 DIAGNOSIS — I872 Venous insufficiency (chronic) (peripheral): Secondary | ICD-10-CM | POA: Diagnosis not present

## 2022-10-22 DIAGNOSIS — L97822 Non-pressure chronic ulcer of other part of left lower leg with fat layer exposed: Secondary | ICD-10-CM | POA: Diagnosis not present

## 2022-10-22 NOTE — Progress Notes (Addendum)
COMMIE, FENDLEY ([id number]) [id number] Page 1 of 8 Visit Report for 10/22/2022 Arrival Information Details Patient Name: Date of Service: [name] 10/22/2022 12:45 PM Medical Record Number: [id number] Patient Account Number: [id number] Date of Birth/Sex: Treating RN: January 06, 1956 (67 y.o. [name] Primary Care Malcolm Hetz: [name] Other Clinician: [name] Referring Jasmeen Fritsch: Treating Manisha Cancel/Extender: Matthew Folks in Treatment: 14 Visit Information History Since Last Visit All ordered tests and consults were completed: No Patient Arrived: [name] Added or deleted any medications: No Arrival Time: 12:51 Any new allergies or adverse reactions: No Transfer Assistance: None Had a fall or experienced change in No Patient Identification Verified: Yes activities of daily living that may affect Secondary Verification Process Completed: Yes risk of falls: Patient Requires Transmission-Based Precautions: No Signs or symptoms of abuse/neglect since last visito No Patient Has Alerts: No Hospitalized since last visit: No Implantable device outside of the clinic excluding No cellular tissue based products placed in the center since last visit: Has Dressing in Place as Prescribed: Yes Has Compression in Place as Prescribed: Yes Pain Present Now: No Electronic Signature(s) Signed: 10/23/2022 12:57:35 PM By: [name] Entered By: [name] on 10/22/2022 12:58:07 -------------------------------------------------------------------------------- Clinic Level of Care Assessment Details Patient Name: Date of Service: [name] 10/22/2022 12:45 PM Medical Record Number: [id number] Patient Account Number: [id number] Date of Birth/Sex: Treating RN: Dec 23, 1955 (67 y.o. [name] Primary Care Jameire Kouba: [name] Other Clinician: [name] Referring Vangie Henthorn: Treating Kyrell Ruacho/Extender: Matthew Folks in Treatment: 14 Clinic Level of Care Assessment Items TOOL 1 Quantity Score []  - 0 Use when EandM and Procedure is performed on INITIAL visit ASSESSMENTS - Nursing Assessment / Reassessment []  - 0 General Physical Exam (combine w/ comprehensive assessment (listed just below) when performed on new pt. evals) []  - 0 Comprehensive Assessment (HX, ROS, Risk Assessments, Wounds Hx, etc.) ASSESSMENTS - Wound and Skin Assessment / Reassessment []  - 0 Dermatologic / Skin Assessment (not related to wound area) ASSESSMENTS - Ostomy and/or Continence Assessment and Care []  - 0 Incontinence Assessment and Management []  - 0 Ostomy Care Assessment and Management (repouching, etc.) PROCESS - Coordination of Care []  - 0 Simple Patient / Family Education for ongoing care []  - 0 Complex (extensive) Patient / Family Education for ongoing care []  - 0 Staff obtains Consents, Records, T Results / Process Orders est []  - 0 Staff telephones HHA, Nursing Homes / Clarify orders / etc []  - 0 Routine Transfer to another Facility (non-emergent condition) []  - 0 Routine Hospital Admission (non-emergent condition) BRYSEN, MEDICA ([id number]) [id number] Page 2 of 8 []  - 0 New Admissions / Manufacturing engineer / Ordering NPWT Apligraf, etc. , []  - 0 Emergency Hospital Admission (emergent condition) PROCESS - Special Needs []  - 0 Pediatric / Minor Patient Management []  - 0 Isolation Patient Management []  - 0 Hearing / Language / Visual special needs []  - 0 Assessment of Community assistance (transportation, D/C planning, etc.) []  - 0 Additional assistance / Altered mentation []  - 0 Support Surface(s) Assessment (bed, cushion, seat, etc.) INTERVENTIONS - Miscellaneous []  - 0 External ear exam []  - 0 Patient Transfer (multiple staff / Nurse, adult / Similar devices) []  - 0 Simple Staple / Suture removal (25 or less) []  - 0 Complex  Staple / Suture removal (26 or more) []  - 0 Hypo/Hyperglycemic Management (do not check if billed separately) []  - 0 Ankle / Brachial Index (ABI) - do  not check if billed separately Has the patient been seen at the hospital within the last three years: Yes Total Score: 0 Level Of Care: ____ Electronic Signature(s) Signed: 10/23/2022 12:57:35 PM By: [name] Entered By: [name] on 10/22/2022 13:18:40 -------------------------------------------------------------------------------- Compression Therapy Details Patient Name: Date of Service: [name]. 10/22/2022 12:45 PM Medical Record Number: [id number] Patient Account Number: [id number] Date of Birth/Sex: Treating RN: 1955/11/29 (67 y.o. [name] Primary Care Belissa Kooy: [name] Other Clinician: [name] Referring Jamion Carter: Treating [name]/Extender: Matthew Folks in Treatment: 14 Compression Therapy Performed for Wound Assessment: Wound #2 Left,Circumferential Lower Leg Performed By: Farrel Gordon, Angie, Compression Type: Double Layer Pre Treatment ABI: 0.9 Post Procedure Diagnosis Same as Pre-procedure Electronic Signature(s) Signed: 10/23/2022 12:57:35 PM By: [name] Entered By: [name] on 10/22/2022 13:13:12 -------------------------------------------------------------------------------- Encounter Discharge Information Details Patient Name: Date of Service: Matthew Macadam J. 10/22/2022 12:45 PM Medical Record Number: [id number] Patient Account Number: [id number] Date of Birth/Sex: Treating RN: [birth year] (67 y.o. [name] Primary Care [name]: [name] Other Clinician: [name] Referring Jaquavian Firkus: Treating [name]/Extender: Matthew Folks in Treatment: 14 Encounter Discharge Information Items Post Procedure Vitals Discharge Condition: Stable Temperature (F): 98.2 Ambulatory Status: Cane Pulse (bpm):  66 Discharge Destination: Home Respiratory Rate (breaths/min): 18 SHEP, [name] ([id number]) [id number] Page 3 of 8 Transportation: Private Auto Blood Pressure (mmHg): 93/60 Accompanied By: wife Schedule Follow-up Appointment: Yes Clinical Summary of Care: Electronic Signature(s) Signed: 10/23/2022 12:57:35 PM By: [name] Entered By: [name] on 10/22/2022 13:41:15 -------------------------------------------------------------------------------- Lower Extremity Assessment Details Patient Name: Date of Service: [name] 10/22/2022 12:45 PM Medical Record Number: [id number] Patient Account Number: [id number] Date of Birth/Sex: Treating RN: January 18, 1956 (67 y.o. [name] Primary Care Marvin Maenza: [name] Other Clinician: [name] Referring Lettie Czarnecki: Treating Shaquoia Miers/Extender: Matthew Folks in Treatment: 14 Edema Assessment Assessed: Kyra Searles: Yes] [Right: No] Edema: [Left: Ye] [Right: s] Calf Left: Right: Point of Measurement: 35 cm From Medial Instep 57.5 cm Ankle Left: Right: Point of Measurement: 10 cm From Medial Instep 32 cm Vascular Assessment Pulses: Dorsalis Pedis Palpable: [Left:Yes] Electronic Signature(s) Signed: 10/22/2022 4:04:22 PM By: Angelina Pih Signed: 10/23/2022 12:57:35 PM By: [name] Entered By: [name] on 10/22/2022 13:09:43 -------------------------------------------------------------------------------- Multi Wound Chart Details Patient Name: Date of Service: Matthew Macadam J. 10/22/2022 12:45 PM Medical Record Number: [id number] Patient Account Number: [id number] Date of Birth/Sex: Treating RN: [birth year] (68 y.o. 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 Secondary Etiology: Extremity Arrhythmia, Congestive Heart Failure, N/A N/A Comorbid History: Hypertension, Type II Diabetes 06/15/2022 N/A N/A Date Acquired: 14 N/A N/A Weeks of Treatment: Open N/A N/A Wound Status: No N/A N/A Wound Recurrence: 13x8x0.3 N/A N/A Measurements L x W x D (cm) 81.681 N/A N/A A (cm) : rea 24.504 N/A N/A Volume (cm) : 86.40% N/A N/A % Reduction in Area: 59.20% N/A N/A % Reduction in Volume: Full Thickness Without Exposed N/A N/A Classification: Support Structures Medium N/A N/A Exudate Amount: Serosanguineous N/A N/A Exudate Type:  red, brown N/A N/A Exudate Color: Small (1-33%) N/A N/A Granulation Amount: Red N/A N/A Granulation Quality: Large (67-100%) N/A N/A Necrotic Amount: Fat Layer (Subcutaneous Tissue): Yes N/A N/A Exposed Structures: Fascia: No Tendon: No Muscle: No Joint: No Bone: No None N/A N/A Epithelialization: Treatment Notes Electronic Signature(s) Signed: 10/23/2022 12:57:35 PM By: [name] Entered By: [name] on 10/22/2022 13:09:50 -------------------------------------------------------------------------------- Multi-Disciplinary Care Plan Details Patient Name: Date of Service: [name] 10/22/2022 12:45 PM Medical Record Number: [id number] Patient Account Number: [id number] Date of Birth/Sex: Treating RN: 02-10-1956 (67 y.o. [name] Primary Care Joell Usman: [name] Other Clinician: [name] Referring Lavette Yankovich: Treating Kwali Wrinkle/Extender: Matthew Folks in Treatment: 14 Active Inactive Venous Leg  Ulcer Nursing Diagnoses: Actual venous Insuffiency (use after diagnosis is confirmed) Goals: Patient will maintain optimal edema control Date Initiated: 10/15/2022 Target Resolution Date: 10/15/2022 Goal Status: Active Patient/caregiver will verbalize understanding of disease process and disease management ALTUS, SMEDBERG ([id number]) [phone].pdf Page 5 of 8 Date Initiated: 10/15/2022 Target Resolution Date: 10/15/2022 Goal Status: Active Verify adequate tissue perfusion prior to therapeutic compression application Date Initiated: 10/15/2022 Target Resolution Date: 10/15/2022 Goal Status: Active Interventions: Assess peripheral edema status every visit. Compression as ordered Provide education on venous insufficiency Treatment Activities: Therapeutic compression applied : 10/15/2022 Notes: Wound/Skin Impairment Nursing Diagnoses: Knowledge deficit related to ulceration/compromised skin integrity Goals: Patient/caregiver will verbalize understanding of skin care regimen Date Initiated: 07/16/2022 Target Resolution Date: 10/24/2022 Goal Status: Active Ulcer/skin breakdown will have a volume reduction of 30% by week 4 Date Initiated: 07/16/2022 Date Inactivated: 09/10/2022 Target Resolution Date: 08/14/2022 Goal Status: Unmet Unmet Reason: comorbidties Ulcer/skin breakdown will have a volume reduction of 50% by week 8 Date Initiated: 07/16/2022 Date Inactivated: 09/24/2022 Target Resolution Date: 09/14/2022 Goal Status: Unmet Unmet Reason: comorbidities Ulcer/skin breakdown will have a volume reduction of 80% by week 12 Date Initiated: 07/16/2022 Target Resolution Date: 10/14/2022 Goal Status: Active Ulcer/skin breakdown will heal within 14 weeks Date Initiated: 07/16/2022 Target Resolution Date: 11/14/2022 Goal Status: Active Interventions: Assess patient/caregiver ability to obtain necessary supplies Assess patient/caregiver ability to perform ulcer/skin care regimen upon  admission and as needed Assess ulceration(s) every visit Notes: Electronic Signature(s) Signed: 10/22/2022 4:04:22 PM By: Angelina Pih Signed: 10/23/2022 12:57:35 PM By: [name] Entered By: [name] on 10/22/2022 13:36:58 -------------------------------------------------------------------------------- Pain Assessment Details Patient Name: Date of Service: Matthew Macadam J. 10/22/2022 12:45 PM Medical Record Number: [id number] Patient Account Number: [id number] Date of Birth/Sex: Treating RN: 03-29-1956 (67 y.o. [name] Primary Care Makail Watling: [name] Other Clinician: [name] Referring Shaneya Taketa: Treating Dedrick Heffner/Extender: Matthew Folks in Treatment: 14 Active Problems Location of Pain Severity and Description of Pain Patient Has Paino No Site Locations PRIMITIVO, NIKODEM ([id number]) [phone].pdf Page 6 of 8 Pain Management and Medication Current Pain Management: Electronic Signature(s) Signed: 10/22/2022 4:04:22 PM By: Angelina Pih Signed: 10/23/2022 12:57:35 PM By: [name] Entered By: [name] on 10/22/2022 13:00:10 -------------------------------------------------------------------------------- Patient/Caregiver Education Details Patient Name: Date of Service: [name] 5/9/2024andnbsp12:45 PM Medical Record Number: [id number] Patient Account Number: [id number] Date of Birth/Gender: Treating RN: [birth year] (67 y.o. [name] Primary Care Physician: [name] Other Clinician: [name] Referring Physician: Treating Physician/Extender: Matthew Folks in Treatment: 14 Education Assessment Education Provided To: Patient Education Topics Provided Wound/Skin Impairment: Handouts: Other: continue wound care as directed Methods: Explain/Verbal Responses: State content correctly Electronic Signature(s) Signed: 10/23/2022 12:57:35 PM By:  [name] Entered By: [name] on 10/22/2022 13:40:32 -------------------------------------------------------------------------------- Wound Assessment Details Patient Name: Date of Service: [name] 10/22/2022 12:45 PM Medical Record Number: [id number] Patient Account Number: [id number] Date of Birth/Sex: Treating RN: 10/16/1955 (67 y.o. 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, Circumferential Lower Leg Secondary Diabetic Wound/Ulcer of the Lower Extremity Wounding Event: Gradually Appeared Etiology: Date Acquired: 06/15/2022 ANSH, BAITY ([id number]) [id number] Page 7 of 8 Date Acquired: 06/15/2022 Wound Status: Open Weeks Of Treatment: 14 Comorbid Arrhythmia, Congestive Heart Failure, Hypertension, Type Clustered Wound: No History: II Diabetes Photos Wound Measurements Length: (cm) 13 Width: (cm) 8 Depth: (cm) 0.3 Area: (cm) 81.681 Volume: (cm) 24.504 % Reduction in Area: 86.4% % Reduction in Volume: 59.2% Epithelialization: None Wound Description Classification: Full Thickness Without Exposed Suppor Exudate Amount: Medium Exudate Type: Serosanguineous Exudate Color: red, brown t Structures Foul Odor After Cleansing: No Slough/Fibrino Yes Wound Bed Granulation Amount: Small (1-33%) Exposed Structure Granulation Quality: Red Fascia Exposed: No Necrotic Amount: Large (67-100%) Fat Layer (Subcutaneous Tissue) Exposed: Yes Necrotic Quality: Adherent Slough Tendon Exposed: No Muscle Exposed: No Joint Exposed: No Bone Exposed: No Treatment Notes Wound #2 (Lower Leg) Wound Laterality: Left, Circumferential Cleanser Soap and Water Discharge Instruction: Gently cleanse wound with  antibacterial soap, rinse and pat dry prior to dressing wounds Peri-Wound Care AandD Ointment Discharge Instruction: Apply AandD Ointment as directed AROUND TOP OF WRAP TO ANCHOR Topical Primary Dressing AquacelAg Advantage Dressing, 4x5 (in/in) Discharge Instruction: Apply to wound as directed Secondary Dressing ABD Pad 5x9 (in/in) Discharge Instruction: Cover with ABD pad Secured With Compression Wrap Urgo K2 Lite, two layer compression system, large Compression Stockings Add-Ons Electronic Signature(s) Signed: 10/22/2022 4:04:22 PM By: Angelina Pih Signed: 10/23/2022 12:57:35 PM By: [name] Entered By: [name] on 10/22/2022 13:09:15 [name] ([id number]) [id number] Page 8 of 8 -------------------------------------------------------------------------------- Vitals Details Patient Name: Date of Service: [name] 10/22/2022 12:45 PM Medical Record Number: [id number] Patient Account Number: [id number] Date of Birth/Sex: Treating RN: 02/24/1956 (67 y.o. [name] Primary Care Kanon Novosel: [name] Other Clinician: [name] Referring Kimbery Harwood: Treating Narek Kniss/Extender: Matthew Folks in Treatment: 14 Vital Signs Time Taken: 12:58 Temperature (F): 98.2 Height (in): 73 Pulse (bpm): 66 Weight (lbs): 358 Respiratory Rate (breaths/min): 18 Body Mass Index (BMI): 47.2 Blood Pressure (mmHg): 93/60 Reference Range: 80 - 120 mg / dl Electronic Signature(s) Signed: 10/23/2022 12:57:35 PM By: [name] Entered By: [name] on 10/22/2022 13:00:04

## 2022-10-22 NOTE — Progress Notes (Addendum)
ARIEON, PHILIP ([id number]) [id number] Page 1 of 7 Visit Report for 10/22/2022 Chief Complaint Document Details Patient Name: Date of Service: [name] 10/22/2022 12:45 PM Medical Record Number: [id number] Patient Account Number: [id number] Date of Birth/Sex: Treating RN: [birth year] (67 y.o. [name] Primary Care Provider: [name] Other Clinician: [name] Referring Provider: Treating Provider/Extender: Matthew Folks in Treatment: 14 Information Obtained from: Patient Chief Complaint Bilateral LE ulcers and lymphedema Electronic Signature(s) Signed: 10/22/2022 12:56:07 PM By: Allen Derry PA-C Entered By: Allen Derry on 10/22/2022 12:56:07 -------------------------------------------------------------------------------- Debridement Details Patient Name: Date of Service: [name] 10/22/2022 12:45 PM Medical Record Number: [id number] Patient Account Number: [id number] Date of Birth/Sex: Treating RN: 10/09/1955 (67 y.o. [name] Primary Care Provider: [name] Other Clinician: [name] Referring Provider: Treating Provider/Extender: Matthew Folks in Treatment: 14 Debridement Performed for Assessment: Wound #2 Left,Circumferential Lower Leg Performed By: Physician Allen Derry, PA-C Debridement Type: Debridement Severity of Tissue Pre Debridement: Fat layer exposed Level of Consciousness (Pre-procedure): Awake and Alert Pre-procedure Verification/Time Out Yes - 13:13 Taken: Start Time: 13:13 Percent of Wound Bed Debrided: 5% T Area Debrided (cm): otal 4.08 Tissue and other material debrided: Viable, Non-Viable, Slough, Subcutaneous, Slough Level: Skin/Subcutaneous Tissue Debridement Description: Excisional Instrument: Curette Bleeding: Minimum Hemostasis Achieved: Pressure Response to Treatment: Procedure was tolerated well Level of Consciousness  (Post- Awake and Alert procedure): Post Debridement Measurements of Total Wound Length: (cm) 13 Width: (cm) 8 Depth: (cm) 0.3 Volume: (cm) 24.504 Character of Wound/Ulcer Post Debridement: Stable Severity of Tissue Post Debridement: Fat layer exposed Post Procedure Diagnosis Same as Pre-procedure Electronic Signature(s) Signed: 10/22/2022 4:04:13 PM By: Allen Derry PA-C Signed: 10/22/2022 4:04:22 PM By: Angelina Pih Signed: 10/23/2022 12:57:35 PM By: [name] Entered By: [name] on 10/22/2022 13:17:54 [name] ([id number]) [id number] Page 2 of 7 -------------------------------------------------------------------------------- HPI Details Patient Name: Date of Service: [name] 10/22/2022 12:45 PM Medical Record Number: [id number] Patient Account Number: [id number] Date of Birth/Sex: Treating RN: [birth year] (67 y.o. [name] Primary Care Provider: [name] Other Clinician: [name] Referring Provider: Treating Provider/Extender: Matthew Folks in Treatment: 14 History of Present Illness HPI Description: 07-16-2022 upon evaluation today patient appears to be doing poorly currently in regard to his his legs. The left leg is actually worse than the right. This is a patient that is previously been seen in the [place] office and at that point was doing really quite well with compression wrapping and often alginate are either Hydrofera Blue dressings. Fortunately he has gone since November 2020 until just current with out having any issue or need for wound care services which is great news. Unfortunately he is here today because he is having some issues at this point. Patient does have a history of several medical conditions which include diabetes mellitus type 2, lymphedema, hypertension, congestive heart failure, and cardiac arrhythmia though is not sure that this is actually atrial  fibrillation based on what he has been told. He does have lymphedema pumps but tells me he has not used them since the summer when he had to move to a remodel of his building and subsequently never unpacked them. 07-23-2022 upon evaluation today patient appears to be doing well with regard to his legs. He is going require some sharp debridement today but overall seems to be making some progress. I am pleased in that regard he has much less  drainage that he had did last week I think the compression wraps are definitely helping. 07-30-2022 upon evaluation today patient appears to be doing poorly in regard to his legs he actually has blue-green drainage which I think is consistent with Pseudomonas. I believe that we need to address this as soon as possible and I discussed that with the patient today as well. Fortunately I do not see any signs of active infection locally nor systemically at this time which is great news. No fevers, chills, nausea, vomiting, or diarrhea. 08-13-2022 upon evaluation today patient appears to be doing well currently in regard to his leg ulcers which are actually looking much better. Fortunately there does not appear to be any signs of active infection at this time which is great news. No fevers, chills, nausea, vomiting, or diarrhea. 3/7; patient with predominantly a left medial lower extremity wound as well as several smaller areas and a small area on the right lateral. His Jodie Echevaria came in today. We are using silver alginate as the primary dressing 09-03-2022 upon evaluation today patient appears to be doing well currently in regard to his wounds. He has been tolerating the dressing changes without complication. Fortunately there does not appear to be any signs of infection there is needed however for sharp debridement. 09-10-2022 upon evaluation today patient appears to be doing well currently in regard to his wound. Has been tolerating the dressing changes without complication.  Fortunately there does not appear to be any signs of infection looking systemically which is great news and overall I am extremely pleased with where we stand today. I do not see any signs of infection. 09-17-2022 upon evaluation today patient actually is making signs of improvement the good news is he is making good improvement. With that being said he still is having quite a time keeping the swelling down. We have been using Tubigrip I think that still probably our best option but nonetheless I think that we are making progress with regard to his wounds. 09-24-2022 upon evaluation today patient appears to be doing well currently in regard to his wounds is continuing to make improvements at this point which is great news and overall I am extremely pleased with where we stand today. Fortunately I do not see any evidence of active infection at this time which is great news and in general I think he is doing quite well with the [facility] and silver alginate. 10-01-2022 upon evaluation today patient appears to be doing pretty well currently in regard to his wounds. He has been tolerating the dressing changes without complication. Fortunately I do not see any evidence of active infection locally nor systemically which is great news and I do believe that 10-08-2022 upon evaluation today patient's wounds actually are showing some signs of improvement which is great news. Still this is very slow to heal I really feel like he would do better if we can get him in some stronger compression wrapping he does have the juxta lite compression wraps therefore we can actually see about doing that over top of his Tubigrip to see if that can be beneficial. He does not love the hide he had as they are not the most comfortable thing for him but nonetheless I think it would be beneficial he is in agreement with going forward with this. 10-15-2022 upon evaluation today patient appears to be doing well currently in regard to his  wounds that do not appear to be infected which is good news. With that being  said unfortunately he still continues to have significant amount of swelling we really need to do something to try to get the swelling under control. I do not see any signs of infection locally nor systemically but at the same time the amount of edema is tremendous. The juxta lite helps but again he is only taking the fluid pills once or twice a week due to the amount that makes him go to the restroom. Therefore he does not take this daily which is really what he needs on a more regular basis. We need to try to get some compression on and is a little bit stronger. 10-22-2022 upon evaluation today patient actually appears to be making some really good progress in regard to his wound. The compression wrap was put on last time actually doing a great job he looks much improved and in general I feel like that we are making great progress. I do not see any evidence of active infection locally nor systemically which is great news. Electronic Signature(s) Signed: 10/22/2022 1:20:54 PM By: Allen Derry PA-C Entered By: Allen Derry on 10/22/2022 13:20:54 -------------------------------------------------------------------------------- Physical Exam Details Patient Name: Date of Service: [name] 10/22/2022 12:45 PM Medical Record Number: [id number] Patient Account Number: [id number] Date of Birth/Sex: Treating RN: 12/05/1955 (67 y.o. [name] Primary Care Provider: [name] Other Clinician: [name] Referring Provider: Treating Provider/Extender: Matthew Folks in Treatment: [address] ([id number]) [id number] Page 3 of 7 Constitutional Well-nourished and well-hydrated in no acute distress. Respiratory normal breathing without difficulty. Psychiatric this patient is able to make decisions and demonstrates good insight into disease process. Alert  and Oriented x 3. pleasant and cooperative. Notes Upon inspection patient's wound bed showed signs of good granulation epithelization at this point. Fortunately there does not appear to be any evidence of infection locally or systemically which is great news and overall I do believe that we are moving in the right direction here. Electronic Signature(s) Signed: 10/22/2022 1:21:10 PM By: Allen Derry PA-C Entered By: Allen Derry on 10/22/2022 13:21:10 -------------------------------------------------------------------------------- Physician Orders Details Patient Name: Date of Service: GA Tammi Sou 10/22/2022 12:45 PM Medical Record Number: [id number] Patient Account Number: [id number] Date of Birth/Sex: Treating RN: 12/17/1955 (67 y.o. [name] Primary Care Provider: [name] Other Clinician: [name] Referring Provider: Treating Provider/Extender: Matthew Folks in Treatment: 14 Verbal / Phone Orders: Yes Clinician: Angelina Pih Read Back and Verified: Yes Diagnosis Coding ICD-10 Coding Code Description E11.622 Type 2 diabetes mellitus with other skin ulcer I89.0 Lymphedema, not elsewhere classified L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 L97.812 Non-pressure chronic ulcer of other part of right lower leg with fat layer exposed I10 Essential (primary) hypertension I50.42 Chronic combined systolic (congestive) and diastolic (congestive) heart failure I49.9 Cardiac arrhythmia, unspecified Follow-up Appointments Return Appointment in 1 week. Nurse Visit as needed Bathing/ Shower/ Hygiene May shower with wound dressing protected with water repellent cover or cast protector. Anesthetic (Use 'Patient Medications' Section for Anesthetic Order Entry) Lidocaine applied to wound bed Edema Control - Lymphedema / Segmental Compressive Device / Other Elevate, Exercise Daily and A void Standing  for Long Periods of Time. Elevate legs to the level of the heart and pump ankles as often as possible Elevate leg(s) parallel to the floor when sitting. Compression Pump: Use compression pump on left lower extremity for 60 minutes, twice daily. Compression Pump: Use compression  pump on right lower extremity for 60 minutes, twice daily. DO YOUR BEST to sleep in the bed at night. DO NOT sleep in your recliner. Long hours of sitting in a recliner leads to swelling of the legs and/or potential wounds on your backside. Wound Treatment Wound #2 - Lower Leg Wound Laterality: Left, Circumferential Cleanser: Soap and Water 2 x Per Week/30 Days Discharge Instructions: Gently cleanse wound with antibacterial soap, rinse and pat dry prior to dressing wounds Peri-Wound Care: AandD Ointment 2 x Per Week/30 Days Discharge Instructions: Apply AandD Ointment as directed AROUND TOP OF WRAP TO ANCHOR Prim Dressing: AquacelAg Advantage Dressing, 4x5 (in/in) 2 x Per Week/30 Days ary Discharge Instructions: Apply to wound as directed Secondary Dressing: ABD Pad 5x9 (in/in) 2 x Per Week/30 Days JOHNATTAN, CRASK ([id number]) [id number] Page 4 of 7 Discharge Instructions: Cover with ABD pad Compression Wrap: Urgo K2 Lite, two layer compression system, large 2 x Per Week/30 Days Electronic Signature(s) Signed: 10/22/2022 4:04:13 PM By: Allen Derry PA-C Signed: 10/23/2022 12:57:35 PM By: [name] Entered By: [name] on 10/22/2022 13:18:14 -------------------------------------------------------------------------------- Problem List Details Patient Name: Date of Service: [name] 10/22/2022 12:45 PM Medical Record Number: [id number] Patient Account Number: [id number] Date of Birth/Sex: Treating RN: [birth year] (67 y.o. [name] Primary Care Provider: [name] Other Clinician: [name] Referring Provider: Treating Provider/Extender: Matthew Folks in Treatment: 14 Active Problems ICD-10 Encounter Code Description Active Date MDM Diagnosis E11.622 Type 2 diabetes mellitus with other skin ulcer 07/16/2022 No Yes I89.0 Lymphedema, not elsewhere classified 07/16/2022 No Yes L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2/06/2022 No Yes L97.812 Non-pressure chronic ulcer of other part of right lower leg with fat layer 07/16/2022 No Yes exposed I10 Essential (primary) hypertension 07/16/2022 No Yes I50.42 Chronic combined systolic (congestive) and diastolic (congestive) heart failure 07/16/2022 No Yes I49.9 Cardiac arrhythmia, unspecified 07/16/2022 No Yes Inactive Problems Resolved Problems Electronic Signature(s) Signed: 10/22/2022 12:56:04 PM By: Allen Derry PA-C Entered By: Allen Derry on 10/22/2022 12:56:04 -------------------------------------------------------------------------------- Progress Note Details Patient Name: Date of Service: [name], [name] 10/22/2022 12:45 PM Medical Record Number: [id number] Patient Account Number: [id number] KOFI, PRINKEY ([id number]) [id number] Page 5 of 7 Date of Birth/Sex: Treating RN: 03/05/1956 (67 y.o. [name] Primary Care Provider: Other Clinician: Polite, Joaquin Courts, Karoline Caldwell Referring Provider: Treating Provider/Extender: Matthew Folks in Treatment: 14 Subjective Chief Complaint Information obtained from Patient Bilateral LE ulcers and lymphedema History of Present Illness (HPI) 07-16-2022 upon evaluation today patient appears to be doing poorly currently in regard to his his legs. The left leg is actually worse than the right. This is a patient that is previously been seen in the [place] office and at that point was doing really quite well with compression wrapping and often alginate are either Hydrofera Blue dressings. Fortunately he has gone since November  2020 until just current with out having any issue or need for wound care services which is great news. Unfortunately he is here today because he is having some issues at this point. Patient does have a history of several medical conditions which include diabetes mellitus type 2, lymphedema, hypertension, congestive heart failure, and cardiac arrhythmia though is not sure that this is actually atrial fibrillation based on what he has been told. He does have lymphedema pumps but tells me he has not used them since the summer when he had  to move to a remodel of his building and subsequently never unpacked them. 07-23-2022 upon evaluation today patient appears to be doing well with regard to his legs. He is going require some sharp debridement today but overall seems to be making some progress. I am pleased in that regard he has much less drainage that he had did last week I think the compression wraps are definitely helping. 07-30-2022 upon evaluation today patient appears to be doing poorly in regard to his legs he actually has blue-green drainage which I think is consistent with Pseudomonas. I believe that we need to address this as soon as possible and I discussed that with the patient today as well. Fortunately I do not see any signs of active infection locally nor systemically at this time which is great news. No fevers, chills, nausea, vomiting, or diarrhea. 08-13-2022 upon evaluation today patient appears to be doing well currently in regard to his leg ulcers which are actually looking much better. Fortunately there does not appear to be any signs of active infection at this time which is great news. No fevers, chills, nausea, vomiting, or diarrhea. 3/7; patient with predominantly a left medial lower extremity wound as well as several smaller areas and a small area on the right lateral. His Jodie Echevaria came in today. We are using silver alginate as the primary dressing 09-03-2022 upon evaluation today  patient appears to be doing well currently in regard to his wounds. He has been tolerating the dressing changes without complication. Fortunately there does not appear to be any signs of infection there is needed however for sharp debridement. 09-10-2022 upon evaluation today patient appears to be doing well currently in regard to his wound. Has been tolerating the dressing changes without complication. Fortunately there does not appear to be any signs of infection looking systemically which is great news and overall I am extremely pleased with where we stand today. I do not see any signs of infection. 09-17-2022 upon evaluation today patient actually is making signs of improvement the good news is he is making good improvement. With that being said he still is having quite a time keeping the swelling down. We have been using Tubigrip I think that still probably our best option but nonetheless I think that we are making progress with regard to his wounds. 09-24-2022 upon evaluation today patient appears to be doing well currently in regard to his wounds is continuing to make improvements at this point which is great news and overall I am extremely pleased with where we stand today. Fortunately I do not see any evidence of active infection at this time which is great news and in general I think he is doing quite well with the [facility] and silver alginate. 10-01-2022 upon evaluation today patient appears to be doing pretty well currently in regard to his wounds. He has been tolerating the dressing changes without complication. Fortunately I do not see any evidence of active infection locally nor systemically which is great news and I do believe that 10-08-2022 upon evaluation today patient's wounds actually are showing some signs of improvement which is great news. Still this is very slow to heal I really feel like he would do better if we can get him in some stronger compression wrapping he does have the  juxta lite compression wraps therefore we can actually see about doing that over top of his Tubigrip to see if that can be beneficial. He does not love the hide he had as they are  not the most comfortable thing for him but nonetheless I think it would be beneficial he is in agreement with going forward with this. 10-15-2022 upon evaluation today patient appears to be doing well currently in regard to his wounds that do not appear to be infected which is good news. With that being said unfortunately he still continues to have significant amount of swelling we really need to do something to try to get the swelling under control. I do not see any signs of infection locally nor systemically but at the same time the amount of edema is tremendous. The juxta lite helps but again he is only taking the fluid pills once or twice a week due to the amount that makes him go to the restroom. Therefore he does not take this daily which is really what he needs on a more regular basis. We need to try to get some compression on and is a little bit stronger. 10-22-2022 upon evaluation today patient actually appears to be making some really good progress in regard to his wound. The compression wrap was put on last time actually doing a great job he looks much improved and in general I feel like that we are making great progress. I do not see any evidence of active infection locally nor systemically which is great news. Objective Constitutional Well-nourished and well-hydrated in no acute distress. Vitals Time Taken: 12:58 PM, Height: 73 in, Weight: 358 lbs, BMI: 47.2, Temperature: 98.2 F, Pulse: 66 bpm, Respiratory Rate: 18 breaths/min, Blood Pressure: 93/60 mmHg. Respiratory normal breathing without difficulty. TYTON, ROTTIER ([id number]) [id number] Page 6 of 7 Psychiatric this patient is able to make decisions and demonstrates good insight into disease process. Alert and Oriented x 3.  pleasant and cooperative. General Notes: Upon inspection patient's wound bed showed signs of good granulation epithelization at this point. Fortunately there does not appear to be any evidence of infection locally or systemically which is great news and overall I do believe that we are moving in the right direction here. Integumentary (Hair, Skin) Wound #2 status is Open. Original cause of wound was Gradually Appeared. The date acquired was: 06/15/2022. The wound has been in treatment 14 weeks. The wound is located on the Left,Circumferential Lower Leg. The wound measures 13cm length x 8cm width x 0.3cm depth; 81.681cm^2 area and 24.504cm^3 volume. There is Fat Layer (Subcutaneous Tissue) exposed. There is a medium amount of serosanguineous drainage noted. There is small (1-33%) red granulation within the wound bed. There is a large (67-100%) amount of necrotic tissue within the wound bed including Adherent Slough. 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 There was a Excisional Skin/Subcutaneous Tissue Debridement with a total area of 4.08 sq cm performed by Allen Derry, PA-C. With the following instrument(s): Curette to remove Viable and Non-Viable tissue/material. Material removed includes Subcutaneous Tissue and Slough and. A time out was conducted at 13:13, prior to the start of the procedure. A Minimum amount of bleeding was  controlled with Pressure. The procedure was tolerated well. Post  Debridement Measurements: 13cm length x 8cm width x 0.3cm depth; 24.504cm^3 volume. Character of Wound/Ulcer Post Debridement is stable. Severity of Tissue Post Debridement is: Fat layer exposed. 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 There was a Double Layer Compression Therapy Procedure with a pre-treatment ABI of 0.9 by [name]. Post procedure Diagnosis Wound #2: Same as Pre-Procedure Plan Follow-up Appointments: Return Appointment in 1 week. Nurse Visit as needed Bathing/ Shower/ Hygiene: May shower with wound dressing protected with water repellent cover or cast protector. Anesthetic (Use 'Patient Medications' Section for Anesthetic Order Entry): Lidocaine applied to wound bed Edema Control - Lymphedema / Segmental Compressive Device / Other: Elevate, Exercise Daily and Avoid Standing for Long Periods of Time. Elevate legs to the level of the heart and pump ankles as often as possible Elevate leg(s) parallel to the floor when sitting. Compression Pump: Use compression pump on left lower extremity for 60 minutes, twice daily. Compression Pump: Use compression pump on right lower extremity for 60 minutes, twice daily. DO YOUR BEST to sleep in the bed at night. DO NOT sleep in your recliner. Long hours of sitting in a recliner leads to swelling of the legs and/or potential wounds on your backside. WOUND #2: - Lower Leg Wound Laterality: Left, Circumferential Cleanser: Soap and Water 2 x Per Week/30 Days Discharge Instructions: Gently cleanse wound with antibacterial soap, rinse and pat dry prior to dressing wounds Peri-Wound Care: AandD Ointment 2 x Per Week/30 Days Discharge Instructions: Apply AandD Ointment as directed AROUND TOP OF WRAP TO ANCHOR Prim Dressing: AquacelAg Advantage Dressing,  4x5 (in/in) 2 x Per Week/30 Days ary Discharge Instructions: Apply to wound as directed Secondary Dressing: ABD Pad 5x9 (in/in) 2 x Per Week/30 Days Discharge Instructions: Cover with ABD pad Com pression Wrap: Urgo K2 Lite, two layer compression system, large 2 x Per Week/30 Days 1. I am going to suggest that we have the patient continue to monitor for any signs of infection or worsening. I do think that we are doing really quite well as far as the wound is concerned were using the Aquacel were using AandD ointment around the edges of the wound and on the leg in general. DAQUAN, RHOTON ([id number]) [id number] Page 7 of 7 2. Regular continue with the Urgo K2 compression wrap lite which is really doing quite well I am going to suggest ABD pads to cover and overall the swelling looks dramatically improved. We will see patient back for reevaluation in 1 week here in the clinic. If anything worsens or changes patient will contact our office for additional recommendations. Electronic Signature(s) Signed: 10/22/2022 1:21:42 PM By: Allen Derry PA-C Entered By: Allen Derry on 10/22/2022 13:21:42 -------------------------------------------------------------------------------- SuperBill Details Patient Name: Date of Service: [name], [name] 10/22/2022 Medical Record Number: [id number] Patient Account Number: [id number] Date of Birth/Sex: Treating RN: 1956-01-17 (67 y.o. 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  layer exposed I10 Essential (primary) hypertension I50.42 Chronic combined systolic (congestive) and diastolic (congestive) heart failure I49.9  Cardiac arrhythmia, unspecified Facility Procedures : CPT4 Code: 78469629 Description: 11042 - DEB SUBQ TISSUE 20 SQ CM/< ICD-10 Diagnosis Description L97.822 Non-pressure chronic ulcer of other part of left lower leg with fat layer expos Modifier: ed Quantity: 1 Physician Procedures : CPT4 Code Description Modifier [id number] 11042 - WC PHYS SUBQ TISS 20 SQ CM ICD-10 Diagnosis Description L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 Quantity: 1 Electronic Signature(s) Signed: 10/22/2022 1:22:13 PM By: Allen Derry PA-C Entered By: Allen Derry on 10/22/2022 13:22:13

## 2022-10-29 ENCOUNTER — Encounter: Payer: PPO | Admitting: Physician Assistant

## 2022-10-29 DIAGNOSIS — E11622 Type 2 diabetes mellitus with other skin ulcer: Secondary | ICD-10-CM | POA: Diagnosis not present

## 2022-10-29 DIAGNOSIS — I872 Venous insufficiency (chronic) (peripheral): Secondary | ICD-10-CM | POA: Diagnosis not present

## 2022-10-29 DIAGNOSIS — L97822 Non-pressure chronic ulcer of other part of left lower leg with fat layer exposed: Secondary | ICD-10-CM | POA: Diagnosis not present

## 2022-10-29 NOTE — Progress Notes (Signed)
JALON, CREPEAU ([id number]) [id number] Page 1 of 2 Visit Report for 10/29/2022 Chief Complaint Document Details Patient Name: Date of Service: [name] 10/29/2022 10:45 A M Medical Record Number: [id number] Patient Account Number: [id number] Date of Birth/Sex: Treating RN: 04-07-1956 (67 y.o. [name] Primary Care Provider: [name] Other Clinician: [name] Referring Provider: Treating Provider/Extender: Matthew Folks in Treatment: 15 Information Obtained from: Patient Chief Complaint Bilateral LE ulcers and lymphedema Electronic Signature(s) Signed: 10/29/2022 11:26:35 AM By: Allen Derry PA-C Entered By: Allen Derry on 10/29/2022 11:26:35 -------------------------------------------------------------------------------- Problem List Details Patient Name: Date of Service: [name] [name] 10/29/2022 10:45 A M Medical Record Number: [id number] Patient Account Number: [id number] Date of Birth/Sex: Treating RN: June 27, 1955 (68 y.o. [name] Primary Care Provider: [name] Other Clinician: [name] Referring Provider: Treating Provider/Extender: Matthew Folks in Treatment: 15 Active Problems ICD-10 Encounter Code Description Active Date MDM Diagnosis E11.622 Type 2 diabetes mellitus with other skin ulcer 07/16/2022 No Yes I89.0 Lymphedema, not elsewhere classified 07/16/2022 No Yes L97.822 Non-pressure chronic ulcer of other part of left lower leg with 07/16/2022 No Yes fat layer exposed L97.812 Non-pressure chronic ulcer of other part of right lower leg with 07/16/2022 No Yes fat layer exposed I10 Essential (primary) hypertension 07/16/2022 No Yes I50.42 Chronic combined systolic (congestive) and diastolic 07/16/2022 No Yes (congestive) heart failure I49.9 Cardiac arrhythmia, unspecified 07/16/2022 No Yes Inactive Problems IWAN, AMAN ([id number])  [id number] Page 2 of 2 Resolved Problems Electronic Signature(s) Signed: 10/29/2022 11:26:32 AM By: Allen Derry PA-C Entered By: Allen Derry on 10/29/2022 11:26:32

## 2022-11-02 DIAGNOSIS — E11622 Type 2 diabetes mellitus with other skin ulcer: Secondary | ICD-10-CM | POA: Diagnosis not present

## 2022-11-05 ENCOUNTER — Encounter: Payer: PPO | Admitting: Physician Assistant

## 2022-11-05 DIAGNOSIS — E11622 Type 2 diabetes mellitus with other skin ulcer: Secondary | ICD-10-CM | POA: Diagnosis not present

## 2022-11-05 DIAGNOSIS — L97822 Non-pressure chronic ulcer of other part of left lower leg with fat layer exposed: Secondary | ICD-10-CM | POA: Diagnosis not present

## 2022-11-05 DIAGNOSIS — I872 Venous insufficiency (chronic) (peripheral): Secondary | ICD-10-CM | POA: Diagnosis not present

## 2022-11-05 NOTE — Progress Notes (Signed)
NAFTULY, LYGA ([id number]) [id number] Page 1 of 2 Visit Report for 11/02/2022 Physician Orders Details Patient Name: Date of Service: [name] 11/02/2022 11:30 A M Medical Record Number: [id number] Patient Account Number: [id number] Date of Birth/Sex: Treating RN: 04-02-1956 (66 y.o. [name]) Yevonne Pax Primary Care Provider: [name] Other Clinician: Betha [name] Referring Provider: Treating Provider/Extender: [name] in Treatment: 15 Verbal / Phone Orders: Yes Clinician: Yevonne Pax Read Back and Verified: Yes Diagnosis Coding Follow-up Appointments Return Appointment in 1 week. Nurse Visit as needed Bathing/ Shower/ Hygiene May shower with wound dressing protected with water repellent cover or cast protector. Anesthetic (Use 'Patient Medications' Section for Anesthetic Order Entry) Lidocaine applied to wound bed Edema Control - Lymphedema / Segmental Compressive Device / Other Elevate, Exercise Daily and A void Standing for Long Periods of Time. Elevate legs to the level of the heart and pump ankles as often as possible Elevate leg(s) parallel to the floor when sitting. Compression Pump: Use compression pump on left lower extremity for 60 minutes, twice daily. Compression Pump: Use compression pump on right lower extremity for 60 minutes, twice daily. DO YOUR BEST to sleep in the bed at night. DO NOT sleep in your recliner. Long hours of sitting in a recliner leads to swelling of the legs and/or potential wounds on your backside. Wound Treatment Wound #2 - Lower Leg Wound Laterality: Left, Circumferential Cleanser: Soap and Water 2 x Per Week/30 Days Discharge Instructions: Gently cleanse wound with antibacterial soap, rinse and pat dry prior to dressing wounds Peri-Wound Care: AandD Ointment 2 x Per Week/30 Days Discharge Instructions: Apply AandD Ointment to leg Peri-Wound Care: Desitin Maximum Strength  Ointment 4 (oz) 2 x Per Week/30 Days Discharge Instructions: use to anchor top of wrap Prim Dressing: AquacelAg Advantage Dressing, 4x5 (in/in) 2 x Per Week/30 Days ary Discharge Instructions: Apply to wound as directed Secondary Dressing: ABD Pad 5x9 (in/in) 2 x Per Week/30 Days Discharge Instructions: Cover with ABD pad Compression Wrap: Urgo K2 Lite, two layer compression system, large 2 x Per Week/30 Days Electronic Signature(s) Signed: 11/03/2022 1:52:16 PM By: [name] Signed: 11/04/2022 5:35:29 PM By: Allen Derry PA-C Entered By: [name] on 11/02/2022 11:42:55 -------------------------------------------------------------------------------- SuperBill Details Patient Name: Date of Service: [name] 11/02/2022 Medical Record Number: [id number] Patient Account Number: [id number] Date of Birth/Sex: Treating RN: 1955/11/22 (66 y.o. [name] Primary Care Provider: [name] Other Clinician: Betha [name] Referring Provider: Treating Provider/Extender: [name] in Treatment: [address] [name] ([id number]) [id number] Page 2 of 2 ICD-10 Codes Code Description E11.622 Type 2 diabetes mellitus with other skin ulcer I89.0 Lymphedema, not elsewhere classified L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 L97.812 Non-pressure chronic ulcer of other part of right lower leg with fat layer exposed I10 Essential (primary) hypertension 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

## 2022-11-12 ENCOUNTER — Encounter: Payer: PPO | Admitting: Physician Assistant

## 2022-11-12 DIAGNOSIS — L97822 Non-pressure chronic ulcer of other part of left lower leg with fat layer exposed: Secondary | ICD-10-CM | POA: Diagnosis not present

## 2022-11-12 DIAGNOSIS — E11622 Type 2 diabetes mellitus with other skin ulcer: Secondary | ICD-10-CM | POA: Diagnosis not present

## 2022-11-12 DIAGNOSIS — I872 Venous insufficiency (chronic) (peripheral): Secondary | ICD-10-CM | POA: Diagnosis not present

## 2022-11-19 ENCOUNTER — Encounter: Payer: PPO | Attending: Physician Assistant | Admitting: Physician Assistant

## 2022-11-19 DIAGNOSIS — I890 Lymphedema, not elsewhere classified: Secondary | ICD-10-CM | POA: Insufficient documentation

## 2022-11-19 DIAGNOSIS — E11622 Type 2 diabetes mellitus with other skin ulcer: Secondary | ICD-10-CM | POA: Diagnosis not present

## 2022-11-19 DIAGNOSIS — I5042 Chronic combined systolic (congestive) and diastolic (congestive) heart failure: Secondary | ICD-10-CM | POA: Insufficient documentation

## 2022-11-19 DIAGNOSIS — L97822 Non-pressure chronic ulcer of other part of left lower leg with fat layer exposed: Secondary | ICD-10-CM | POA: Diagnosis not present

## 2022-11-19 DIAGNOSIS — I872 Venous insufficiency (chronic) (peripheral): Secondary | ICD-10-CM | POA: Diagnosis not present

## 2022-11-19 DIAGNOSIS — L97812 Non-pressure chronic ulcer of other part of right lower leg with fat layer exposed: Secondary | ICD-10-CM | POA: Diagnosis not present

## 2022-11-19 DIAGNOSIS — I110 Hypertensive heart disease with heart failure: Secondary | ICD-10-CM | POA: Insufficient documentation

## 2022-11-26 ENCOUNTER — Encounter: Payer: PPO | Admitting: Physician Assistant

## 2022-11-26 DIAGNOSIS — L97822 Non-pressure chronic ulcer of other part of left lower leg with fat layer exposed: Secondary | ICD-10-CM | POA: Diagnosis not present

## 2022-11-26 DIAGNOSIS — E11622 Type 2 diabetes mellitus with other skin ulcer: Secondary | ICD-10-CM | POA: Diagnosis not present

## 2022-11-26 DIAGNOSIS — I872 Venous insufficiency (chronic) (peripheral): Secondary | ICD-10-CM | POA: Diagnosis not present

## 2022-11-27 NOTE — Progress Notes (Signed)
[name] ([id number]) [id number] Page 1 of 9 Visit Report for 11/26/2022 Arrival Information Details Patient Name: Date of Service: [name] 11/26/2022 12:00 PM Medical Record Number: [id number] Patient Account Number: [id number] Date of Birth/Sex: Treating RN: [birth year] (67 y.o. [name] Primary Care [name]: [name] Other Clinician: Referring [name]: Treating [name]/Extender: [name] in Treatment: 19 Visit Information History Since Last Visit Added or deleted any medications: No Patient Arrived: [name] Any new allergies or adverse reactions: No Arrival Time: 12:09 Had a fall or experienced change in No Accompanied By: self activities of daily living that may affect Transfer Assistance: None risk of falls: Patient Identification Verified: Yes Hospitalized since last visit: No Secondary Verification Process Completed: Yes Has Dressing in Place as Prescribed: Yes Patient Requires Transmission-Based Precautions: No Has Compression in Place as Prescribed: Yes Patient Has Alerts: No Pain Present Now: Yes Electronic Signature(s) Signed: 11/26/2022 4:30:05 PM By: [name] Entered By: [name] on 11/26/2022 12:09:27 -------------------------------------------------------------------------------- Clinic Level of Care Assessment Details Patient Name: Date of Service: [name] 11/26/2022 12:00 PM Medical Record Number: [id number] Patient Account Number: [id number] Date of Birth/Sex: Treating RN: 1955-10-19 (67 y.o. [name] Primary Care [name]: [name] Other Clinician: Referring [name]: Treating [name]/Extender: [name] in Treatment: 19 Clinic Level of Care Assessment Items TOOL 1 Quantity Score []  - 0 Use when EandM and Procedure is performed on INITIAL visit ASSESSMENTS - Nursing Assessment / Reassessment []  - 0 General  Physical Exam (combine w/ comprehensive assessment (listed just below) when performed on new pt. evals) []  - 0 Comprehensive Assessment (HX, ROS, Risk Assessments, Wounds Hx, etc.) ASSESSMENTS - Wound and Skin Assessment / Reassessment []  - 0 Dermatologic / Skin Assessment (not related to wound area) HARM, TEO ([id number]) [id number] Page 2 of 9 ASSESSMENTS - Ostomy and/or Continence Assessment and Care []  - 0 Incontinence Assessment and Management []  - 0 Ostomy Care Assessment and Management (repouching, etc.) PROCESS - Coordination of Care []  - 0 Simple Patient / Family Education for ongoing care []  - 0 Complex (extensive) Patient / Family Education for ongoing care []  - 0 Staff obtains Chiropractor, Records, T Results / Process Orders est []  - 0 Staff telephones HHA, Nursing Homes / Clarify orders / etc []  - 0 Routine Transfer to another Facility (non-emergent condition) []  - 0 Routine Hospital Admission (non-emergent condition) []  - 0 New Admissions / Manufacturing engineer / Ordering NPWT Apligraf, etc. , []  - 0 Emergency Hospital Admission (emergent condition) PROCESS - Special Needs []  - 0 Pediatric / Minor Patient Management []  - 0 Isolation Patient Management []  - 0 Hearing / Language / Visual special needs []  - 0 Assessment of Community assistance (transportation, D/C planning, etc.) []  - 0 Additional assistance / Altered mentation []  - 0 Support Surface(s) Assessment (bed, cushion, seat, etc.) INTERVENTIONS - Miscellaneous []  - 0 External ear exam []  - 0 Patient Transfer (multiple staff / Nurse, adult / Similar devices) []  - 0 Simple Staple / Suture removal (25 or less) []  - 0 Complex Staple / Suture removal (26 or more) []  - 0 Hypo/Hyperglycemic Management (do not check if billed separately) []  - 0 Ankle / Brachial Index (ABI) - do not check if billed separately Has the patient been seen at the hospital within the last  three years: Yes Total Score: 0 Level Of Care: ____ Electronic Signature(s) Signed: 11/26/2022 4:30:05 PM By: [name] Entered By:  [name] on 11/26/2022 12:41:23 -------------------------------------------------------------------------------- Compression Therapy Details Patient Name: Date of Service: [name] 11/26/2022 12:00 PM Medical Record Number: [id number] Patient Account Number: [id number] Date of Birth/Sex: Treating RN: Sep 17, 1955 (67 y.o. [name] Primary Care Dajsha Massaro: [name] Other Clinician: Referring Lind Ausley: Treating [name]/Extender: [name] in Treatment: 19 Compression Therapy Performed for Wound Assessment: Wound #2 Left,Circumferential Lower Leg Performed By: Matthew Bodily, RN Compression Type: Double JAYLYNN, HOLME ([id number]) [id number] Page 3 of 9 Post Procedure Diagnosis Same as Pre-procedure Electronic Signature(s) Signed: 11/26/2022 4:30:05 PM By: [name] Entered By: [name] on 11/26/2022 12:28:48 -------------------------------------------------------------------------------- Encounter Discharge Information Details Patient Name: Date of Service: [name], Matthew [name] 11/26/2022 12:00 PM Medical Record Number: [id number] Patient Account Number: [id number] Date of Birth/Sex: Treating RN: 1955-11-04 (67 y.o. [name] Primary Care Lando Alcalde: [name] Other Clinician: Referring Dariona Postma: Treating [name]/Extender: [name] in Treatment: 19 Encounter Discharge Information Items Discharge Condition: Stable Ambulatory Status: Cane Discharge Destination: Home Transportation: Private Auto Accompanied By: self Schedule Follow-up Appointment: Yes Clinical Summary of Care: Electronic Signature(s) Signed: 11/26/2022 4:30:05 PM By: [name] Entered By: [name] on 11/26/2022  12:42:26 -------------------------------------------------------------------------------- Lower Extremity Assessment Details Patient Name: Date of Service: Matthew Burly [name] 11/26/2022 12:00 PM Medical Record Number: [id number] Patient Account Number: [id number] Date of Birth/Sex: Treating RN: 17-Nov-1955 (67 y.o. [name] Primary Care Anisah Kuck: [name] Other Clinician: Referring Cyenna Rebello: Treating Kennth Vanbenschoten/Extender: [name] in Treatment: 19 Edema Assessment Assessed: [Left: No] [name]: No] Edema: [Left: Ye] [Right: s] Calf THONG, RASPA ([id number]) [phone].pdf Page 4 of 9 Left: Right: Point of Measurement: 35 cm From Medial Instep 55 cm Ankle Left: Right: Point of Measurement: 10 cm From Medial Instep 29 cm Vascular Assessment Pulses: Dorsalis Pedis Palpable: [Left:Yes] Electronic Signature(s) Signed: 11/26/2022 4:30:05 PM By: [name] Entered By: [name] on 11/26/2022 12:19:30 -------------------------------------------------------------------------------- Multi Wound Chart Details Patient Name: Date of Service: Matthew Burly [name] 11/26/2022 12:00 PM Medical Record Number: [id number] Patient Account Number: [id number] Date of Birth/Sex: Treating RN: 02-08-1956 (67 y.o. 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 Secondary Etiology: Extremity Arrhythmia, Congestive Heart Failure,  N/A N/A Comorbid HistoryJIBRAEL, HARTFIELD ([id number]) [id number] Page 5 of 9 Hypertension, Type II Diabetes 06/15/2022 N/A N/A Date Acquired: 40 N/A N/A Weeks of Treatment: Open N/A N/A Wound Status: No N/A N/A Wound Recurrence: 3x12x0.2 N/A N/A Measurements L x W x D (cm) 28.274 N/A N/A A (cm) : rea 5.655 N/A N/A Volume (cm) : 95.30% N/A N/A % Reduction in Area: 90.60% N/A N/A % Reduction in Volume: Full Thickness Without Exposed N/A N/A Classification: Support Structures Medium N/A N/A Exudate Amount: Serosanguineous N/A N/A Exudate Type: red, brown N/A N/A Exudate Color: Medium (34-66%) N/A N/A Granulation Amount: Pink, Pale N/A N/A Granulation Quality: Medium (34-66%) N/A N/A Necrotic Amount: Fat Layer (Subcutaneous Tissue): Yes N/A N/A Exposed Structures: Fascia: No Tendon: No Muscle: No Joint: No Bone: No None N/A N/A Epithelialization: Treatment Notes Electronic Signature(s) Signed: 11/26/2022 4:30:05  PM By: [name] Entered By: [name] on 11/26/2022 12:26:53 -------------------------------------------------------------------------------- Multi-Disciplinary Care Plan Details Patient Name: Date of Service: [name], Matthew [name] 11/26/2022 12:00 PM Medical Record Number: [id number] Patient Account Number: [id number] Date of Birth/Sex: Treating RN: 1955/07/19 (67 y.o. [name] Primary Care Arrie Zuercher: [name] Other Clinician: Referring Isahia Hollerbach: Treating Marcille Barman/Extender: [name] in Treatment: 19 Active Inactive Venous Leg Ulcer Nursing Diagnoses: Actual venous Insuffiency (use after diagnosis is confirmed) Goals: Patient will maintain optimal edema control Date Initiated: 10/15/2022 Target Resolution Date: 12/03/2022 Goal Status: Active Patient/caregiver will verbalize understanding of disease process and disease management Date Initiated: 10/15/2022 Date Inactivated:  11/12/2022 Target Resolution Date: 10/15/2022 Goal Status: Met Verify adequate tissue perfusion prior to therapeutic compression application Date Initiated: 10/15/2022 Date Inactivated: 11/12/2022 Target Resolution Date: 10/15/2022 Goal Status: Met Interventions: Assess peripheral edema status every visit. Compression as ordered ARIANNA, BETHLEY ([id number]) [id number] Page 6 of 9 Provide education on venous insufficiency Treatment Activities: Therapeutic compression applied : 10/15/2022 Notes: Electronic Signature(s) Signed: 11/26/2022 4:30:05 PM By: [name] Entered By: [name] on 11/26/2022 12:41:43 -------------------------------------------------------------------------------- Pain Assessment Details Patient Name: Date of Service: Matthew Burly [name] 11/26/2022 12:00 PM Medical Record Number: [id number] Patient Account Number: [id number] Date of Birth/Sex: Treating RN: 1955-12-19 (67 y.o. [name] Primary Care Darlys Buis: [name] Other Clinician: Referring Lennix Rotundo: Treating Shaun Zuccaro/Extender: [name] in Treatment: 19 Active Problems Location of Pain Severity and Description of Pain Patient Has Paino Yes Site Locations Rate the pain. Current Pain Level: 6 Pain Management and Medication Current Pain Management: Electronic Signature(s) Signed: 11/26/2022 4:30:05 PM By: [name] Entered By: [name] on 11/26/2022 12:09:48 [name] ([id number]) [id number] Page 7 of 9 -------------------------------------------------------------------------------- Patient/Caregiver Education Details Patient Name: Date of Service: [name] 6/13/2024andnbsp12:00 PM Medical Record Number: [id number] Patient Account Number: [id number] Date of Birth/Gender: Treating RN: 10/23/1955 (67 y.o. [name] Primary Care Physician: [name] Other  Clinician: Referring Physician: Treating Physician/Extender: [name] in Treatment: 19 Education Assessment Education Provided To: Patient Education Topics Provided Venous: Handouts: Controlling Swelling with Compression Stockings Methods: Explain/Verbal Responses: State content correctly Wound/Skin Impairment: Handouts: Caring for Your Ulcer Methods: Explain/Verbal Responses: State content correctly Electronic Signature(s) Signed: 11/26/2022 4:30:05 PM By: [name] Entered By: [name] on 11/26/2022 12:41:58 -------------------------------------------------------------------------------- Wound Assessment Details Patient Name: Date of Service: Matthew Burly [name] 11/26/2022 12:00 PM Medical Record Number: [id number] Patient Account Number: [id number] Date of Birth/Sex: Treating RN: 10/01/1955 (67 y.o. [name] Primary Care Nick Armel: [name] Other Clinician: Referring Jodiann Ognibene: Treating Keairra Bardon/Extender: [name] in Treatment: 19 Wound Status Wound Number: 2 Primary Venous Leg Ulcer Etiology: Wound Location: Left, Circumferential Lower Leg Secondary Diabetic Wound/Ulcer of the Lower Extremity Wounding Event: Gradually Appeared Etiology: Date Acquired: 06/15/2022 Wound Status: Open Weeks Of Treatment: 19 Comorbid Arrhythmia, Congestive Heart Failure, Hypertension, Type Clustered Wound: No History: II Diabetes SUEO, ISRAEL ([id number]) [phone].pdf Page 8 of 9 Photos Wound Measurements Length: (cm) 3 Width: (cm) 12 Depth: (cm) 0.2 Area: (cm) 28.274 Volume: (cm) 5.655 % Reduction in Area: 95.3% % Reduction in Volume: 90.6% Epithelialization: None Tunneling: No Undermining: No Wound Description Classification: Full Thickness Without Exposed Suppor Exudate Amount: Medium Exudate Type: Serosanguineous Exudate Color: red, brown t Structures Foul Odor After  Cleansing: No Slough/Fibrino Yes Wound Bed Granulation Amount: Medium (34-66%) Exposed Structure Granulation Quality: Pink, Pale Fascia Exposed:  No Necrotic Amount: Medium (34-66%) Fat Layer (Subcutaneous Tissue) Exposed: Yes Necrotic Quality: Adherent Slough Tendon Exposed: No Muscle Exposed: No Joint Exposed: No Bone Exposed: No Treatment Notes Wound #2 (Lower Leg) Wound Laterality: Left, Circumferential Cleanser Soap and Water Discharge Instruction: Gently cleanse wound with antibacterial soap, rinse and pat dry prior to dressing wounds Peri-Wound Care AandD Ointment Discharge Instruction: Apply AandD Ointment to leg Desitin Maximum Strength Ointment 4 (oz) Discharge Instruction: use to anchor top of wrap Topical Primary Dressing AquacelAg Advantage Dressing, 4x5 (in/in) Discharge Instruction: Apply to wound as directed Secondary Dressing ABD Pad 5x9 (in/in) Discharge Instruction: Cover with ABD pad Secured With Compression Wrap Urgo K2 Lite, two layer compression system, large Compression Stockings Add-Ons Electronic Signature(s) Signed: 11/26/2022 4:30:05 PM By: [name] Entered By: [name] on 11/26/2022 12:18:45 [name] ([id number]) [id number] Page 9 of 9 -------------------------------------------------------------------------------- Vitals Details Patient Name: Date of Service: [name] 11/26/2022 12:00 PM Medical Record Number: [id number] Patient Account Number: [id number] Date of Birth/Sex: Treating RN: 1955/08/02 (67 y.o. [name] Primary Care Tysin Salada: [name] Other Clinician: Referring Deaglan Lile: Treating Aziya Arena/Extender: [name] in Treatment: 19 Vital Signs Time Taken: 12:09 Temperature (F): 97.6 Height (in): 73 Pulse (bpm): 71 Weight (lbs): 358 Respiratory Rate (breaths/min): 18 Body Mass Index (BMI): 47.2 Blood Pressure (mmHg):  126/78 Reference Range: 80 - 120 mg / dl Electronic Signature(s) Signed: 11/26/2022 4:30:05 PM By: [name] Entered By: [name] on 11/26/2022 12:09:42

## 2022-11-27 NOTE — Progress Notes (Signed)
RUVIM, ZAVATSKY ([id number]) [id number] Page 1 of 8 Visit Report for 11/26/2022 Chief Complaint Document Details Patient Name: Date of Service: [name] 11/26/2022 12:00 PM Medical Record Number: [id number] Patient Account Number: [id number] Date of Birth/Sex: Treating RN: 1956-02-10 (67 y.o. [name] Primary Care Provider: [name] Other Clinician: Referring Provider: Treating Provider/Extender: Matthew Folks in Treatment: 19 Information Obtained from: Patient Chief Complaint Bilateral LE ulcers and lymphedema Electronic Signature(s) Signed: 11/26/2022 11:45:20 AM By: Allen Derry PA-C Entered By: Allen Derry on 11/26/2022 11:45:20 -------------------------------------------------------------------------------- HPI Details Patient Name: Date of Service: [name], Matthew [name] 11/26/2022 12:00 PM Medical Record Number: [id number] Patient Account Number: [id number] Date of Birth/Sex: Treating RN: 03-14-1956 (67 y.o. [name] Primary Care Provider: [name] Other Clinician: Referring Provider: Treating Provider/Extender: Matthew Folks in Treatment: 19 History of Present Illness HPI Description: 07-16-2022 upon evaluation today patient appears to be doing poorly currently in regard to his his legs. The left leg is actually worse than the right. This is a patient that is previously been seen in the [place] office and at that point was doing really quite well with compression wrapping and often alginate are either Hydrofera Blue dressings. Fortunately he has gone since November 2020 until just current with out having any issue or need for wound care services which is great news. Unfortunately he is here today because he is having some issues at this point. Patient does have a history of several medical conditions which include diabetes mellitus type 2, lymphedema, hypertension, congestive  heart failure, and cardiac arrhythmia though is not sure that this is actually atrial fibrillation based on what he has been told. He does have lymphedema pumps but tells me he has not used them since the summer when he had to move to a remodel of his building and subsequently never unpacked them. 07-23-2022 upon evaluation today patient appears to be doing well with regard to his legs. He is going require some sharp debridement today but overall seems to be making some progress. I am pleased in that regard he has much less drainage that he had did last week I think the compression wraps are definitely helping. 07-30-2022 upon evaluation today patient appears to be doing poorly in regard to his legs he actually has blue-green drainage which I think is consistent with Pseudomonas. I believe that we need to address this as soon as possible and I discussed that with the patient today as well. Fortunately I do not see any signs of active infection locally nor systemically at this time which is great news. No fevers, chills, nausea, vomiting, or diarrhea. RADHAMES, ESMAILI ([id number]) [id number] Page 2 of 8 08-13-2022 upon evaluation today patient appears to be doing well currently in regard to his leg ulcers which are actually looking much better. Fortunately there does not appear to be any signs of active infection at this time which is great news. No fevers, chills, nausea, vomiting, or diarrhea. 3/7; patient with predominantly a left medial lower extremity wound as well as several smaller areas and a small area on the right lateral. His Jodie Echevaria came in today. We are using silver alginate as the primary dressing 09-03-2022 upon evaluation today patient appears to be doing well currently in regard to his wounds. He has been tolerating the dressing changes without complication. Fortunately there does not appear to be any signs of infection there is needed however for sharp  debridement. 09-10-2022  upon evaluation today patient appears to be doing well currently in regard to his wound. Has been tolerating the dressing changes without complication. Fortunately there does not appear to be any signs of infection looking systemically which is great news and overall I am extremely pleased with where we stand today. I do not see any signs of infection. 09-17-2022 upon evaluation today patient actually is making signs of improvement the good news is he is making good improvement. With that being said he still is having quite a time keeping the swelling down. We have been using Tubigrip I think that still probably our best option but nonetheless I think that we are making progress with regard to his wounds. 09-24-2022 upon evaluation today patient appears to be doing well currently in regard to his wounds is continuing to make improvements at this point which is great news and overall I am extremely pleased with where we stand today. Fortunately I do not see any evidence of active infection at this time which is great news and in general I think he is doing quite well with the [facility] and silver alginate. 10-01-2022 upon evaluation today patient appears to be doing pretty well currently in regard to his wounds. He has been tolerating the dressing changes without complication. Fortunately I do not see any evidence of active infection locally nor systemically which is great news and I do believe that 10-08-2022 upon evaluation today patient's wounds actually are showing some signs of improvement which is great news. Still this is very slow to heal I really feel like he would do better if we can get him in some stronger compression wrapping he does have the juxta lite compression wraps therefore we can actually see about doing that over top of his Tubigrip to see if that can be beneficial. He does not love the hide he had as they are not the most comfortable thing for him but nonetheless  I think it would be beneficial he is in agreement with going forward with this. 10-15-2022 upon evaluation today patient appears to be doing well currently in regard to his wounds that do not appear to be infected which is good news. With that being said unfortunately he still continues to have significant amount of swelling we really need to do something to try to get the swelling under control. I do not see any signs of infection locally nor systemically but at the same time the amount of edema is tremendous. The juxta lite helps but again he is only taking the fluid pills once or twice a week due to the amount that makes him go to the restroom. Therefore he does not take this daily which is really what he needs on a more regular basis. We need to try to get some compression on and is a little bit stronger. 10-22-2022 upon evaluation today patient actually appears to be making some really good progress in regard to his wound. The compression wrap was put on last time actually doing a great job he looks much improved and in general I feel like that we are making great progress. I do not see any evidence of active infection locally nor systemically which is great news. 10-29-2022 upon evaluation today patient appears to be doing decently well in regard to his legs currently. I feel like that his swelling is much better I feel like that he is also doing much better in regard to the wounds in general. I do not see any signs of  active infection which is great news and in general I think that he is making excellent progress towards complete resolution. The patient does seem to have 1 issue that is with his wraps actually slipping down working I definitely need to work on that aspect. 11-05-2022 upon evaluation today patient appears to be doing excellent in regard to his wound. He has been tolerating the dressing changes without complication. He actually seems to be making excellent progress I am extremely  pleased with where things stand currently. I do believe that we are really making good progress and I think that we are on the right track towards complete closure I think the compression wrap has been a dramatic shift in a good way for him as far as getting the wounds healed a lot of these areas that we will continue to leave Completely sealed up. 11-12-2022 upon evaluation today patient appears to be doing well currently in regard to his leg ulcer. The compression wrapping seems to be doing an excellent job. Fortunately I do not see any evidence of active infection locally nor systemically which is great news and in general I do believe that we are making good headway here towards complete closure. 11-19-2022 upon evaluation today patient appears to be doing well currently in regard to his wounds. He is actually showing signs of excellent improvement and very pleased with where we stand I think that he is making great progress. I do not see any evidence of infection at this time which is great news. 11-26-2022 upon evaluation today patient appears to be doing well currently in regard to his wound. He is actually been tolerating the dressing changes without complication. Fortunately I do not see any evidence of active infection locally nor systemically which is great news I think he is making excellent progress here towards closure. Electronic Signature(s) Signed: 11/26/2022 12:43:43 PM By: Allen Derry PA-C Entered By: Allen Derry on 11/26/2022 12:43:42 -------------------------------------------------------------------------------- Physical Exam Details Patient Name: Date of Service: [name], Matthew [name] 11/26/2022 12:00 PM Medical Record Number: [id number] Patient Account Number: [id number] Date of Birth/Sex: Treating RN: September 06, 1955 (67 y.o. [name] Primary Care Provider: [name] Other Clinician: Referring Provider: Treating Provider/Extender: Matthew Folks in  Treatment: [address] ([id number]) [id number] Page 3 of 8 Constitutional Well-nourished and well-hydrated in no acute distress. Respiratory normal breathing without difficulty. Psychiatric this patient is able to make decisions and demonstrates good insight into disease process. Alert and Oriented x 3. pleasant and cooperative. Notes Upon inspection patient's wound bed showed signs of no significant slough buildup I am actually very pleased with where we stand and I do believe that he is making really good progress here. I do not see any evidence of active infection which is great news as well. Electronic Signature(s) Signed: 11/26/2022 12:43:58 PM By: Allen Derry PA-C Entered By: Allen Derry on 11/26/2022 12:43:58 -------------------------------------------------------------------------------- Physician Orders Details Patient Name: Date of Service: [name], Matthew [name] 11/26/2022 12:00 PM Medical Record Number: [id number] Patient Account Number: [id number] Date of Birth/Sex: Treating RN: [birth year] (67 y.o. [name] Primary Care Provider: [name] Other Clinician: Referring Provider: Treating Provider/Extender: Matthew Folks in Treatment: 19 Verbal / Phone Orders: No Diagnosis Coding ICD-10 Coding Code Description E11.622 Type 2 diabetes mellitus with other skin ulcer I89.0 Lymphedema, not elsewhere classified L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 L97.812 Non-pressure chronic ulcer of other part  of right lower leg with fat layer exposed I10 Essential (primary) hypertension I50.42 Chronic combined systolic (congestive) and diastolic (congestive) heart failure I49.9 Cardiac arrhythmia, unspecified Follow-up Appointments Return Appointment in 1 week. Nurse Visit as needed Bathing/ Shower/ Hygiene May shower with wound dressing protected with water repellent  cover or cast protector. Anesthetic (Use 'Patient Medications' Section for Anesthetic Order Entry) Lidocaine applied to wound bed Edema Control - Lymphedema / Segmental Compressive Device / Other Elevate, Exercise Daily and A void Standing for Long Periods of Time. Elevate legs to the level of the heart and pump ankles as often as possible Elevate leg(s) parallel to the floor when sitting. Compression Pump: Use compression pump on left lower extremity for 60 minutes, twice daily. Compression Pump: Use compression pump on right lower extremity for 60 minutes, twice daily. DO YOUR BEST to sleep in the bed at night. DO NOT sleep in your recliner. Long hours of sitting in a recliner leads to swelling of the legs and/or potential wounds on your backside. Wound Treatment [name], [name] ([id number]) [id number] Page 4 of 8 Wound #2 - Lower Leg Wound Laterality: Left, Circumferential Cleanser: Soap and Water 2 x Per Week/30 Days Discharge Instructions: Gently cleanse wound with antibacterial soap, rinse and pat dry prior to dressing wounds Peri-Wound Care: AandD Ointment 2 x Per Week/30 Days Discharge Instructions: Apply AandD Ointment to leg Peri-Wound Care: Desitin Maximum Strength Ointment 4 (oz) 2 x Per Week/30 Days Discharge Instructions: use to anchor top of wrap Prim Dressing: AquacelAg Advantage Dressing, 4x5 (in/in) 2 x Per Week/30 Days ary Discharge Instructions: Apply to wound as directed Secondary Dressing: ABD Pad 5x9 (in/in) 2 x Per Week/30 Days Discharge Instructions: Cover with ABD pad Compression Wrap: Urgo K2 Lite, two layer compression system, large 2 x Per Week/30 Days Electronic Signature(s) Signed: 11/26/2022 4:30:05 PM By: Angelina Pih Signed: 11/26/2022 5:43:48 PM By: Allen Derry PA-C Entered By: Angelina Pih on 11/26/2022 12:41:15 -------------------------------------------------------------------------------- Problem List  Details Patient Name: Date of Service: Matthew Burly [name] 11/26/2022 12:00 PM Medical Record Number: [id number] Patient Account Number: [id number] Date of Birth/Sex: Treating RN: 09/26/1955 (67 y.o. [name] Primary Care Provider: [name] Other Clinician: Referring Provider: Treating Provider/Extender: Matthew Folks in Treatment: 19 Active Problems ICD-10 Encounter Code Description Active Date MDM Diagnosis E11.622 Type 2 diabetes mellitus with other skin ulcer 07/16/2022 No Yes I89.0 Lymphedema, not elsewhere classified 07/16/2022 No Yes L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2/06/2022 No Yes L97.812 Non-pressure chronic ulcer of other part of right lower leg with fat layer 07/16/2022 No Yes exposed I10 Essential (primary) hypertension 07/16/2022 No Yes I50.42 Chronic combined systolic (congestive) and diastolic (congestive) heart failure 07/16/2022 No Yes CEASAR, CLINKENBEARD ([id number]) [id number] Page 5 of 8 I49.9 Cardiac arrhythmia, unspecified 07/16/2022 No Yes Inactive Problems Resolved Problems Electronic Signature(s) Signed: 11/26/2022 11:45:17 AM By: Allen Derry PA-C Entered By: Allen Derry on 11/26/2022 11:45:17 -------------------------------------------------------------------------------- Progress Note Details Patient Name: Date of Service: [name], Matthew [name] 11/26/2022 12:00 PM Medical Record Number: [id number] Patient Account Number: [id number] Date of Birth/Sex: Treating RN: [birth year] (67 y.o. [name] Primary Care Provider: [name] Other Clinician: Referring Provider: Treating Provider/Extender: Matthew Folks in Treatment: 19 Subjective Chief Complaint Information obtained from Patient Bilateral LE ulcers and lymphedema History of Present Illness (HPI) 07-16-2022 upon evaluation today patient appears to be doing poorly  currently in regard to his  his legs. The left leg is actually worse than the right. This is a patient that is previously been seen in the [place] office and at that point was doing really quite well with compression wrapping and often alginate are either Hydrofera Blue dressings. Fortunately he has gone since November 2020 until just current with out having any issue or need for wound care services which is great news. Unfortunately he is here today because he is having some issues at this point. Patient does have a history of several medical conditions which include diabetes mellitus type 2, lymphedema, hypertension, congestive heart failure, and cardiac arrhythmia though is not sure that this is actually atrial fibrillation based on what he has been told. He does have lymphedema pumps but tells me he has not used them since the summer when he had to move to a remodel of his building and subsequently never unpacked them. 07-23-2022 upon evaluation today patient appears to be doing well with regard to his legs. He is going require some sharp debridement today but overall seems to be making some progress. I am pleased in that regard he has much less drainage that he had did last week I think the compression wraps are definitely helping. 07-30-2022 upon evaluation today patient appears to be doing poorly in regard to his legs he actually has blue-green drainage which I think is consistent with Pseudomonas. I believe that we need to address this as soon as possible and I discussed that with the patient today as well. Fortunately I do not see any signs of active infection locally nor systemically at this time which is great news. No fevers, chills, nausea, vomiting, or diarrhea. 08-13-2022 upon evaluation today patient appears to be doing well currently in regard to his leg ulcers which are actually looking much better. Fortunately there does not appear to be any signs of active infection at this time which  is great news. No fevers, chills, nausea, vomiting, or diarrhea. 3/7; patient with predominantly a left medial lower extremity wound as well as several smaller areas and a small area on the right lateral. His Jodie Echevaria came in today. We are using silver alginate as the primary dressing 09-03-2022 upon evaluation today patient appears to be doing well currently in regard to his wounds. He has been tolerating the dressing changes without complication. Fortunately there does not appear to be any signs of infection there is needed however for sharp debridement. 09-10-2022 upon evaluation today patient appears to be doing well currently in regard to his wound. Has been tolerating the dressing changes without complication. Fortunately there does not appear to be any signs of infection looking systemically which is great news and overall I am extremely pleased with where we stand today. I do not see any signs of infection. 09-17-2022 upon evaluation today patient actually is making signs of improvement the good news is he is making good improvement. With that being said he still is having quite a time keeping the swelling down. We have been using Tubigrip I think that still probably our best option but nonetheless I think that we are making progress with regard to his wounds. 09-24-2022 upon evaluation today patient appears to be doing well currently in regard to his wounds is continuing to make improvements at this point which is great news and overall I am extremely pleased with where we stand today. Fortunately I do not see any evidence of active infection at this time which is great [name], [name] ([id number]) [id number]  Page 6 of 8 news and in general I think he is doing quite well with the [facility] and silver alginate. 10-01-2022 upon evaluation today patient appears to be doing pretty well currently in regard to his wounds. He has been tolerating the dressing changes  without complication. Fortunately I do not see any evidence of active infection locally nor systemically which is great news and I do believe that 10-08-2022 upon evaluation today patient's wounds actually are showing some signs of improvement which is great news. Still this is very slow to heal I really feel like he would do better if we can get him in some stronger compression wrapping he does have the juxta lite compression wraps therefore we can actually see about doing that over top of his Tubigrip to see if that can be beneficial. He does not love the hide he had as they are not the most comfortable thing for him but nonetheless I think it would be beneficial he is in agreement with going forward with this. 10-15-2022 upon evaluation today patient appears to be doing well currently in regard to his wounds that do not appear to be infected which is good news. With that being said unfortunately he still continues to have significant amount of swelling we really need to do something to try to get the swelling under control. I do not see any signs of infection locally nor systemically but at the same time the amount of edema is tremendous. The juxta lite helps but again he is only taking the fluid pills once or twice a week due to the amount that makes him go to the restroom. Therefore he does not take this daily which is really what he needs on a more regular basis. We need to try to get some compression on and is a little bit stronger. 10-22-2022 upon evaluation today patient actually appears to be making some really good progress in regard to his wound. The compression wrap was put on last time actually doing a great job he looks much improved and in general I feel like that we are making great progress. I do not see any evidence of active infection locally nor systemically which is great news. 10-29-2022 upon evaluation today patient appears to be doing decently well in regard to his legs currently. I  feel like that his swelling is much better I feel like that he is also doing much better in regard to the wounds in general. I do not see any signs of active infection which is great news and in general I think that he is making excellent progress towards complete resolution. The patient does seem to have 1 issue that is with his wraps actually slipping down working I definitely need to work on that aspect. 11-05-2022 upon evaluation today patient appears to be doing excellent in regard to his wound. He has been tolerating the dressing changes without complication. He actually seems to be making excellent progress I am extremely pleased with where things stand currently. I do believe that we are really making good progress and I think that we are on the right track towards complete closure I think the compression wrap has been a dramatic shift in a good way for him as far as getting the wounds healed a lot of these areas that we will continue to leave Completely sealed up. 11-12-2022 upon evaluation today patient appears to be doing well currently in regard to his leg ulcer. The compression wrapping seems to be doing an  excellent job. Fortunately I do not see any evidence of active infection locally nor systemically which is great news and in general I do believe that we are making good headway here towards complete closure. 11-19-2022 upon evaluation today patient appears to be doing well currently in regard to his wounds. He is actually showing signs of excellent improvement and very pleased with where we stand I think that he is making great progress. I do not see any evidence of infection at this time which is great news. 11-26-2022 upon evaluation today patient appears to be doing well currently in regard to his wound. He is actually been tolerating the dressing changes without complication. Fortunately I do not see any evidence of active infection locally nor systemically which is great news I think  he is making excellent progress here towards closure. Objective Constitutional Well-nourished and well-hydrated in no acute distress. Vitals Time Taken: 12:09 PM, Height: 73 in, Weight: 358 lbs, BMI: 47.2, Temperature: 97.6 F, Pulse: 71 bpm, Respiratory Rate: 18 breaths/min, Blood Pressure: 126/78 mmHg. Respiratory normal breathing without difficulty. Psychiatric this patient is able to make decisions and demonstrates good insight into disease process. Alert and Oriented x 3. pleasant and cooperative. General Notes: Upon inspection patient's wound bed showed signs of no significant slough buildup I am actually very pleased with where we stand and I do believe that he is making really good progress here. I do not see any evidence of active infection which is great news as well. Integumentary (Hair, Skin) Wound #2 status is Open. Original cause of wound was Gradually Appeared. The date acquired was: 06/15/2022. The wound has been in treatment 19 weeks. The wound is located on the Left,Circumferential Lower Leg. The wound measures 3cm length x 12cm width x 0.2cm depth; 28.274cm^2 area and 5.655cm^3 volume. There is Fat Layer (Subcutaneous Tissue) exposed. There is no tunneling or undermining noted. There is a medium amount of serosanguineous drainage noted. There is medium (34-66%) pink, pale granulation within the wound bed. There is a medium (34-66%) amount of necrotic tissue within the wound bed including Adherent Slough. 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 There was a Double Layer Compression Therapy Procedure by Angelina Pih, RN. Post procedure Diagnosis Wound #2: Same as Pre-Procedure Plan Follow-up Appointments: Return Appointment in 1 week. Nurse Visit as needed Bathing/ Shower/ Hygiene: May shower with wound dressing protected with water repellent cover or cast protector. Anesthetic (Use 'Patient Medications' Section for Anesthetic Order Entry): Lidocaine applied to wound bed Edema Control - Lymphedema / Segmental Compressive Device / Other: Elevate, Exercise Daily and Avoid Standing for Long Periods of Time. Elevate legs to the level of the heart and pump ankles as often as possible Elevate leg(s) parallel to the floor when sitting. Compression Pump: Use compression pump on left lower extremity for 60 minutes, twice daily. Compression Pump: Use compression pump on right lower extremity for 60 minutes, twice daily. DO YOUR BEST to sleep in the bed at night. DO NOT sleep in your recliner. Long  hours of sitting in a recliner leads to swelling of the legs and/or potential wounds on your backside. WOUND #2: - Lower Leg Wound Laterality: Left, Circumferential Cleanser: Soap and Water 2 x Per Week/30 Days Discharge Instructions: Gently cleanse wound with antibacterial soap, rinse and pat dry prior to dressing wounds Peri-Wound Care: AandD Ointment 2 x Per Week/30 Days Discharge Instructions: Apply AandD Ointment to leg Peri-Wound Care: Desitin Maximum Strength Ointment 4 (oz) 2 x Per Week/30 Days Discharge Instructions: use to anchor top of wrap Prim Dressing: AquacelAg Advantage Dressing, 4x5 (in/in) 2 x Per Week/30 Days ary Discharge Instructions: Apply to wound as directed Secondary Dressing: ABD Pad 5x9 (in/in) 2 x Per Week/30 Days Discharge Instructions:  Cover with ABD pad Com pression Wrap: Urgo K2 Lite, two layer compression system, large 2 x Per Week/30 Days 1. I would recommend that we have the patient continue to monitor for any evidence of infection or worsening. Overall based on what I am seeing I do think that he is doing excellent we will continue with the compression wrapping this wound is actually making really good progress. 2 also can recommend that he should continue as well with the dead skin around the edges of the wound followed by the silver alginate dressing ABD pads and then the Urgo K2 lite compression wrap. We will see patient back for reevaluation in 1 week here in the clinic. If anything worsens or changes patient will contact our office for additional recommendations. Electronic Signature(s) Signed: 11/26/2022 12:44:29 PM By: Allen Derry PA-C Entered By: Allen Derry on 11/26/2022 12:44:28 -------------------------------------------------------------------------------- SuperBill Details Patient Name: Date of Service: [name], Matthew [name] 11/26/2022 Medical Record Number: [id number] Patient Account Number: [id number] Date of Birth/Sex: Treating RN: [birth year] (67 y.o. [name] Primary Care Provider: [name] Other Clinician: AAIDAN, [name] ([id number]) [id number] Page 8 of 8 Referring Provider: Treating Provider/Extender: Matthew Folks in Treatment: 19 Diagnosis Coding ICD-10 Codes Code Description E11.622 Type 2 diabetes mellitus with other skin ulcer I89.0 Lymphedema, not elsewhere classified L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 L97.812 Non-pressure chronic ulcer of other part of right lower leg with fat layer exposed I10 Essential (primary) hypertension I50.42 Chronic combined systolic (congestive) and diastolic (congestive) heart failure I49.9 Cardiac arrhythmia, unspecified Facility  Procedures : CPT4 Code: 78295621 Description: (Facility Use Only) 29581LT - APPLY MULTLAY COMPRS LWR LT LEG Modifier: Quantity: 1 Physician Procedures : CPT4 Code Description Modifier [id number] - WC PHYS LEVEL 3 - EST PT ICD-10 Diagnosis Description E11.622 Type 2 diabetes mellitus with other skin ulcer I89.0 Lymphedema, not elsewhere classified L97.822 Non-pressure chronic ulcer of other part of  left lower leg with fat layer exposed L97.812 Non-pressure chronic ulcer of other part of right lower leg with fat layer exposed Quantity: 1 Electronic Signature(s) Signed: 11/26/2022 12:46:33 PM By: Allen Derry PA-C Entered By: Allen Derry on 11/26/2022 12:46:33

## 2022-11-30 ENCOUNTER — Ambulatory Visit: Payer: PPO | Admitting: Podiatry

## 2022-11-30 ENCOUNTER — Encounter: Payer: Self-pay | Admitting: Internal Medicine

## 2022-11-30 DIAGNOSIS — M79675 Pain in left toe(s): Secondary | ICD-10-CM | POA: Diagnosis not present

## 2022-11-30 DIAGNOSIS — I493 Ventricular premature depolarization: Secondary | ICD-10-CM

## 2022-11-30 DIAGNOSIS — I5043 Acute on chronic combined systolic (congestive) and diastolic (congestive) heart failure: Secondary | ICD-10-CM

## 2022-11-30 DIAGNOSIS — B351 Tinea unguium: Secondary | ICD-10-CM | POA: Diagnosis not present

## 2022-11-30 DIAGNOSIS — Z79899 Other long term (current) drug therapy: Secondary | ICD-10-CM

## 2022-11-30 DIAGNOSIS — R234 Changes in skin texture: Secondary | ICD-10-CM

## 2022-11-30 DIAGNOSIS — M79674 Pain in right toe(s): Secondary | ICD-10-CM | POA: Diagnosis not present

## 2022-11-30 NOTE — Progress Notes (Unsigned)
       Subjective:  Patient ID: [name], male    DOB: [birth year],  MRN: [id number]   [name] presents to clinic today for:  Chief Complaint  Patient presents with   Nail Problem    Diabetic foot care   . Patient notes nails are thick, discolored, elongated and painful in shoegear when trying to ambulate.  Patient sees wound care regularly.  States that there is a wound on the left rear foot and there is a dressing in place today.  This is not to be removed since it was just recently changed.  The right heel had a piece of gauze secured by paper tape to the area.  He notes that he had a split of the skin in the heel.  States that he has been putting antibiotic ointment on the area and it seems like it is getting better.  PCP is [name], MD.  No Known Allergies  Review of Systems: Negative except as noted in the HPI.  Objective:  There were no vitals filed for this visit.  [name] is a pleasant 67 y.o. male in NAD. AAO x 3.  Vascular Examination: Capillary refill time is 3-5 seconds to toes bilateral.  Trace palpable pedal pulses b/l LE. Digital hair present b/l.  +2 pitting edema b/l. Skin temperature gradient WNL b/l.  No cyanosis or clubbing noted b/l.   Dermatological Examination: Pedal skin with normal turgor, texture and tone b/l.  Dressing intact to left foot.  No interdigital macerations b/l. Toenails x10 are 3mm thick, discolored, dystrophic with subungual debris. There is pain with compression of the nail plates.  They are elongated x10.  There appears to be a recently healed fissure on the plantar aspect of the right heel.  No dried blood is present and no erythema is noted.  Neurological Examination: Protective sensation intact bilateral LE. Vibratory sensation diminished bilateral LE.  Musculoskeletal Examination: Muscle strength 5/5 to all LE muscle groups b/l.   Assessment/Plan: 1. Pain due to onychomycosis of toenails of both feet   2.  Fissure in skin of right foot     The mycotic toenails were sharply debrided x10 with sterile nail nippers and a power debriding burr to decrease bulk/thickness and length.    Recommended urea 40 for the right heel nightly.  Follow-up 3 months  Continue with wound care scheduled appointments for treatment of the left rear foot wound.  The dressing was left intact today.  Return in about 3 months (around 03/02/2023) for [facility] / nail trim.   Clerance Lav, DPM, FACFAS [facility]     [address] [place], [place] [address]                Office [phone]  Fax [phone]

## 2022-11-30 NOTE — Telephone Encounter (Signed)
Stop the mexilitene  Go up on carvedilol to 12.5 bid    Get 3 day monitor in 2 months

## 2022-12-02 ENCOUNTER — Ambulatory Visit: Payer: PPO | Attending: Internal Medicine

## 2022-12-02 DIAGNOSIS — I493 Ventricular premature depolarization: Secondary | ICD-10-CM

## 2022-12-02 DIAGNOSIS — I5043 Acute on chronic combined systolic (congestive) and diastolic (congestive) heart failure: Secondary | ICD-10-CM

## 2022-12-02 DIAGNOSIS — Z79899 Other long term (current) drug therapy: Secondary | ICD-10-CM

## 2022-12-02 MED ORDER — CARVEDILOL 12.5 MG PO TABS: 12.5000 mg | ORAL_TABLET | Freq: Two times a day (BID) | ORAL | 3 refills | Status: DC

## 2022-12-02 NOTE — Progress Notes (Unsigned)
Enrolled patient for a 3 day Zio XT monitor to be mailed to patients home around 01/13/23

## 2022-12-02 NOTE — Addendum Note (Signed)
Addended by: Bertram Millard on: 12/02/2022 01:17 PM   Modules accepted: Orders

## 2022-12-03 ENCOUNTER — Encounter: Payer: PPO | Admitting: Physician Assistant

## 2022-12-03 DIAGNOSIS — L97822 Non-pressure chronic ulcer of other part of left lower leg with fat layer exposed: Secondary | ICD-10-CM | POA: Diagnosis not present

## 2022-12-03 DIAGNOSIS — E11622 Type 2 diabetes mellitus with other skin ulcer: Secondary | ICD-10-CM | POA: Diagnosis not present

## 2022-12-03 DIAGNOSIS — I872 Venous insufficiency (chronic) (peripheral): Secondary | ICD-10-CM | POA: Diagnosis not present

## 2022-12-03 NOTE — Progress Notes (Addendum)
KARLTON, BATSON ([id number]) [id number] Page 1 of 8 Visit Report for 12/03/2022 Chief Complaint Document Details Patient Name: Date of Service: [name] 12/03/2022 1:00 PM Medical Record Number: [id number] Patient Account Number: [id number] Date of Birth/Sex: Treating RN: 15-Dec-1955 (67 y.o. [name] Primary Care Provider: [name] Other Clinician: Referring Provider: Treating Provider/Extender: Matthew Folks in Treatment: 20 Information Obtained from: Patient Chief Complaint Bilateral LE ulcers and lymphedema Electronic Signature(s) Signed: 12/03/2022 12:52:22 PM By: Allen Derry PA-C Entered By: Allen Derry on 12/03/2022 12:52:21 -------------------------------------------------------------------------------- HPI Details Patient Name: Date of Service: [name], Matthew [name] 12/03/2022 1:00 PM Medical Record Number: [id number] Patient Account Number: [id number] Date of Birth/Sex: Treating RN: 1955/09/20 (67 y.o. [name] Primary Care Provider: [name] Other Clinician: Referring Provider: Treating Provider/Extender: Matthew Folks in Treatment: 20 History of Present Illness HPI Description: 07-16-2022 upon evaluation today patient appears to be doing poorly currently in regard to his his legs. The left leg is actually worse than the right. This is a patient that is previously been seen in the [place] office and at that point was doing really quite well with compression wrapping and often alginate are either Hydrofera Blue dressings. Fortunately he has gone since November 2020 until just current with out having any issue or need for wound care services which is great news. Unfortunately he is here today because he is having some issues at this point. Patient does have a history of several medical conditions which include diabetes mellitus type 2, lymphedema, hypertension, congestive  heart failure, and cardiac arrhythmia though is not sure that this is actually atrial fibrillation based on what he has been told. He does have lymphedema pumps but tells me he has not used them since the summer when he had to move to a remodel of his building and subsequently never unpacked them. 07-23-2022 upon evaluation today patient appears to be doing well with regard to his legs. He is going require some sharp debridement today but overall seems to be making some progress. I am pleased in that regard he has much less drainage that he had did last week I think the compression wraps are definitely helping. 07-30-2022 upon evaluation today patient appears to be doing poorly in regard to his legs he actually has blue-green drainage which I think is consistent with Pseudomonas. I believe that we need to address this as soon as possible and I discussed that with the patient today as well. Fortunately I do not see any signs of active infection locally nor systemically at this time which is great news. No fevers, chills, nausea, vomiting, or diarrhea. YEUDIEL, FAINE ([id number]) [id number] Page 2 of 8 08-13-2022 upon evaluation today patient appears to be doing well currently in regard to his leg ulcers which are actually looking much better. Fortunately there does not appear to be any signs of active infection at this time which is great news. No fevers, chills, nausea, vomiting, or diarrhea. 3/7; patient with predominantly a left medial lower extremity wound as well as several smaller areas and a small area on the right lateral. His Jodie Echevaria came in today. We are using silver alginate as the primary dressing 09-03-2022 upon evaluation today patient appears to be doing well currently in regard to his wounds. He has been tolerating the dressing changes without complication. Fortunately there does not appear to be any signs of infection there is needed however for sharp  debridement. 09-10-2022  upon evaluation today patient appears to be doing well currently in regard to his wound. Has been tolerating the dressing changes without complication. Fortunately there does not appear to be any signs of infection looking systemically which is great news and overall I am extremely pleased with where we stand today. I do not see any signs of infection. 09-17-2022 upon evaluation today patient actually is making signs of improvement the good news is he is making good improvement. With that being said he still is having quite a time keeping the swelling down. We have been using Tubigrip I think that still probably our best option but nonetheless I think that we are making progress with regard to his wounds. 09-24-2022 upon evaluation today patient appears to be doing well currently in regard to his wounds is continuing to make improvements at this point which is great news and overall I am extremely pleased with where we stand today. Fortunately I do not see any evidence of active infection at this time which is great news and in general I think he is doing quite well with the [facility] and silver alginate. 10-01-2022 upon evaluation today patient appears to be doing pretty well currently in regard to his wounds. He has been tolerating the dressing changes without complication. Fortunately I do not see any evidence of active infection locally nor systemically which is great news and I do believe that 10-08-2022 upon evaluation today patient's wounds actually are showing some signs of improvement which is great news. Still this is very slow to heal I really feel like he would do better if we can get him in some stronger compression wrapping he does have the juxta lite compression wraps therefore we can actually see about doing that over top of his Tubigrip to see if that can be beneficial. He does not love the hide he had as they are not the most comfortable thing for him but nonetheless  I think it would be beneficial he is in agreement with going forward with this. 10-15-2022 upon evaluation today patient appears to be doing well currently in regard to his wounds that do not appear to be infected which is good news. With that being said unfortunately he still continues to have significant amount of swelling we really need to do something to try to get the swelling under control. I do not see any signs of infection locally nor systemically but at the same time the amount of edema is tremendous. The juxta lite helps but again he is only taking the fluid pills once or twice a week due to the amount that makes him go to the restroom. Therefore he does not take this daily which is really what he needs on a more regular basis. We need to try to get some compression on and is a little bit stronger. 10-22-2022 upon evaluation today patient actually appears to be making some really good progress in regard to his wound. The compression wrap was put on last time actually doing a great job he looks much improved and in general I feel like that we are making great progress. I do not see any evidence of active infection locally nor systemically which is great news. 10-29-2022 upon evaluation today patient appears to be doing decently well in regard to his legs currently. I feel like that his swelling is much better I feel like that he is also doing much better in regard to the wounds in general. I do not see any signs of  active infection which is great news and in general I think that he is making excellent progress towards complete resolution. The patient does seem to have 1 issue that is with his wraps actually slipping down working I definitely need to work on that aspect. 11-05-2022 upon evaluation today patient appears to be doing excellent in regard to his wound. He has been tolerating the dressing changes without complication. He actually seems to be making excellent progress I am extremely  pleased with where things stand currently. I do believe that we are really making good progress and I think that we are on the right track towards complete closure I think the compression wrap has been a dramatic shift in a good way for him as far as getting the wounds healed a lot of these areas that we will continue to leave Completely sealed up. 11-12-2022 upon evaluation today patient appears to be doing well currently in regard to his leg ulcer. The compression wrapping seems to be doing an excellent job. Fortunately I do not see any evidence of active infection locally nor systemically which is great news and in general I do believe that we are making good headway here towards complete closure. 11-19-2022 upon evaluation today patient appears to be doing well currently in regard to his wounds. He is actually showing signs of excellent improvement and very pleased with where we stand I think that he is making great progress. I do not see any evidence of infection at this time which is great news. 11-26-2022 upon evaluation today patient appears to be doing well currently in regard to his wound. He is actually been tolerating the dressing changes without complication. Fortunately I do not see any evidence of active infection locally nor systemically which is great news I think he is making excellent progress here towards closure. 12-03-2022 upon evaluation today patient appears to be doing well currently in regard to his wound. He in fact at both locations is doing great and seems to be making excellent progress and very pleased in that regard. I do not see any signs of active infection at this time. Electronic Signature(s) Signed: 12/03/2022 1:46:23 PM By: Allen Derry PA-C Entered By: Allen Derry on 12/03/2022 13:46:23 -------------------------------------------------------------------------------- Physical Exam Details Patient Name: Date of Service: Matthew Burly [name] 12/03/2022 1:00 PM Medical  Record Number: [id number] Patient Account Number: [id number] Date of Birth/Sex: Treating RN: December 27, 1955 (67 y.o. Whitt, [place] ([id number]) [id number] Page 3 of 8 Primary Care Provider: [name] Other Clinician: Referring Provider: Treating Provider/Extender: Matthew Folks in Treatment: 20 Constitutional Obese and well-hydrated in no acute distress. Respiratory normal breathing without difficulty. Psychiatric this patient is able to make decisions and demonstrates good insight into disease process. Alert and Oriented x 3. pleasant and cooperative. Notes Upon inspection patient's wound bed actually showed signs of good granulation epithelization at this point. Fortunately I see no signs of worsening overall I do believe that he is making excellent progress towards closure. I am extremely pleased with where we stand. Electronic Signature(s) Signed: 12/03/2022 1:46:37 PM By: Allen Derry PA-C Entered By: Allen Derry on 12/03/2022 13:46:37 -------------------------------------------------------------------------------- Physician Orders Details Patient Name: Date of Service: [name], Matthew [name] 12/03/2022 1:00 PM Medical Record Number: [id number] Patient Account Number: [id number] Date of Birth/Sex: Treating RN: 02-Dec-1955 (67 y.o. [name] Primary Care Provider: [name] Other Clinician: Referring Provider: Treating Provider/Extender: Matthew Folks in Treatment: 20 Verbal / Phone  Orders: No Diagnosis Coding ICD-10 Coding Code Description E11.622 Type 2 diabetes mellitus with other skin ulcer I89.0 Lymphedema, not elsewhere classified L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 L97.812 Non-pressure chronic ulcer of other part of right lower leg with fat layer exposed I10 Essential (primary) hypertension I50.42 Chronic combined  systolic (congestive) and diastolic (congestive) heart failure I49.9 Cardiac arrhythmia, unspecified Follow-up Appointments Return Appointment in 1 week. Nurse Visit as needed Bathing/ Shower/ Hygiene May shower with wound dressing protected with water repellent cover or cast protector. Anesthetic (Use 'Patient Medications' Section for Anesthetic Order Entry) Lidocaine applied to wound bed Edema Control - Lymphedema / Segmental Compressive Device / Other Elevate, Exercise Daily and A void Standing for Long Periods of Time. Elevate legs to the level of the heart and pump ankles as often as possible Elevate leg(s) parallel to the floor when sitting. Compression Pump: Use compression pump on left lower extremity for 60 minutes, twice daily. Compression Pump: Use compression pump on right lower extremity for 60 minutes, twice daily. DO YOUR BEST to sleep in the bed at night. DO NOT sleep in your recliner. Long hours of sitting in a recliner leads to swelling of the legs WALDRON, MATOTT ([id number]) [id number] Page 4 of 8 and/or potential wounds on your backside. Wound Treatment Wound #2 - Lower Leg Wound Laterality: Left, Circumferential Cleanser: Soap and Water 2 x Per Week/30 Days Discharge Instructions: Gently cleanse wound with antibacterial soap, rinse and pat dry prior to dressing wounds Peri-Wound Care: AandD Ointment 2 x Per Week/30 Days Discharge Instructions: Apply AandD Ointment to leg Peri-Wound Care: Desitin Maximum Strength Ointment 4 (oz) 2 x Per Week/30 Days Discharge Instructions: use to anchor top of wrap Prim Dressing: AquacelAg Advantage Dressing, 4x5 (in/in) 2 x Per Week/30 Days ary Discharge Instructions: Apply to wound as directed Secondary Dressing: ABD Pad 5x9 (in/in) 2 x Per Week/30 Days Discharge Instructions: Cover with ABD pad Compression Wrap: Urgo K2 Lite, two layer compression system, large 2 x Per Week/30 Days Electronic  Signature(s) Signed: 12/03/2022 4:16:40 PM By: Angelina Pih Signed: 12/03/2022 5:52:14 PM By: Allen Derry PA-C Entered By: Angelina Pih on 12/03/2022 13:57:32 -------------------------------------------------------------------------------- Problem List Details Patient Name: Date of Service: Matthew Burly [name] 12/03/2022 1:00 PM Medical Record Number: [id number] Patient Account Number: [id number] Date of Birth/Sex: Treating RN: Aug 09, 1955 (67 y.o. [name] Primary Care Provider: [name] Other Clinician: Referring Provider: Treating Provider/Extender: Matthew Folks in Treatment: 20 Active Problems ICD-10 Encounter Code Description Active Date MDM Diagnosis E11.622 Type 2 diabetes mellitus with other skin ulcer 07/16/2022 No Yes I89.0 Lymphedema, not elsewhere classified 07/16/2022 No Yes L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2/06/2022 No Yes L97.812 Non-pressure chronic ulcer of other part of right lower leg with fat layer 07/16/2022 No Yes exposed I10 Essential (primary) hypertension 07/16/2022 No Yes NOHE, KUCZMARSKI ([id number]) [id number] Page 5 of 8 I50.42 Chronic combined systolic (congestive) and diastolic (congestive) heart failure 07/16/2022 No Yes I49.9 Cardiac arrhythmia, unspecified 07/16/2022 No Yes Inactive Problems Resolved Problems Electronic Signature(s) Signed: 12/03/2022 12:52:18 PM By: Allen Derry PA-C Entered By: Allen Derry on 12/03/2022 12:52:18 -------------------------------------------------------------------------------- Progress Note Details Patient Name: Date of Service: [name], Matthew [name] 12/03/2022 1:00 PM Medical Record Number: [id number] Patient Account Number: [id number] Date of Birth/Sex: Treating RN: 04/13/1956 (67 y.o. [name] Primary Care Provider: [name] Other Clinician: Referring  Provider: Treating Provider/Extender:  Matthew Folks in Treatment: 20 Subjective Chief Complaint Information obtained from Patient Bilateral LE ulcers and lymphedema History of Present Illness (HPI) 07-16-2022 upon evaluation today patient appears to be doing poorly currently in regard to his his legs. The left leg is actually worse than the right. This is a patient that is previously been seen in the [place] office and at that point was doing really quite well with compression wrapping and often alginate are either Hydrofera Blue dressings. Fortunately he has gone since November 2020 until just current with out having any issue or need for wound care services which is great news. Unfortunately he is here today because he is having some issues at this point. Patient does have a history of several medical conditions which include diabetes mellitus type 2, lymphedema, hypertension, congestive heart failure, and cardiac arrhythmia though is not sure that this is actually atrial fibrillation based on what he has been told. He does have lymphedema pumps but tells me he has not used them since the summer when he had to move to a remodel of his building and subsequently never unpacked them. 07-23-2022 upon evaluation today patient appears to be doing well with regard to his legs. He is going require some sharp debridement today but overall seems to be making some progress. I am pleased in that regard he has much less drainage that he had did last week I think the compression wraps are definitely helping. 07-30-2022 upon evaluation today patient appears to be doing poorly in regard to his legs he actually has blue-green drainage which I think is consistent with Pseudomonas. I believe that we need to address this as soon as possible and I discussed that with the patient today as well. Fortunately I do not see any signs of active infection locally nor systemically at this time which is great news. No fevers, chills, nausea,  vomiting, or diarrhea. 08-13-2022 upon evaluation today patient appears to be doing well currently in regard to his leg ulcers which are actually looking much better. Fortunately there does not appear to be any signs of active infection at this time which is great news. No fevers, chills, nausea, vomiting, or diarrhea. 3/7; patient with predominantly a left medial lower extremity wound as well as several smaller areas and a small area on the right lateral. His Jodie Echevaria came in today. We are using silver alginate as the primary dressing 09-03-2022 upon evaluation today patient appears to be doing well currently in regard to his wounds. He has been tolerating the dressing changes without complication. Fortunately there does not appear to be any signs of infection there is needed however for sharp debridement. 09-10-2022 upon evaluation today patient appears to be doing well currently in regard to his wound. Has been tolerating the dressing changes without complication. Fortunately there does not appear to be any signs of infection looking systemically which is great news and overall I am extremely pleased with where we stand today. I do not see any signs of infection. 09-17-2022 upon evaluation today patient actually is making signs of improvement the good news is he is making good improvement. With that being said he still is having quite a time keeping the swelling down. We have been using Tubigrip I think that still probably our best option but nonetheless I think that we are CASSEL, JARECKI ([id number]) [id number] Page 6 of 8 making progress with regard to his wounds. 09-24-2022 upon evaluation today patient appears to  be doing well currently in regard to his wounds is continuing to make improvements at this point which is great news and overall I am extremely pleased with where we stand today. Fortunately I do not see any evidence of active infection at this time which is  great news and in general I think he is doing quite well with the [facility] and silver alginate. 10-01-2022 upon evaluation today patient appears to be doing pretty well currently in regard to his wounds. He has been tolerating the dressing changes without complication. Fortunately I do not see any evidence of active infection locally nor systemically which is great news and I do believe that 10-08-2022 upon evaluation today patient's wounds actually are showing some signs of improvement which is great news. Still this is very slow to heal I really feel like he would do better if we can get him in some stronger compression wrapping he does have the juxta lite compression wraps therefore we can actually see about doing that over top of his Tubigrip to see if that can be beneficial. He does not love the hide he had as they are not the most comfortable thing for him but nonetheless I think it would be beneficial he is in agreement with going forward with this. 10-15-2022 upon evaluation today patient appears to be doing well currently in regard to his wounds that do not appear to be infected which is good news. With that being said unfortunately he still continues to have significant amount of swelling we really need to do something to try to get the swelling under control. I do not see any signs of infection locally nor systemically but at the same time the amount of edema is tremendous. The juxta lite helps but again he is only taking the fluid pills once or twice a week due to the amount that makes him go to the restroom. Therefore he does not take this daily which is really what he needs on a more regular basis. We need to try to get some compression on and is a little bit stronger. 10-22-2022 upon evaluation today patient actually appears to be making some really good progress in regard to his wound. The compression wrap was put on last time actually doing a great job he looks much improved and in general I  feel like that we are making great progress. I do not see any evidence of active infection locally nor systemically which is great news. 10-29-2022 upon evaluation today patient appears to be doing decently well in regard to his legs currently. I feel like that his swelling is much better I feel like that he is also doing much better in regard to the wounds in general. I do not see any signs of active infection which is great news and in general I think that he is making excellent progress towards complete resolution. The patient does seem to have 1 issue that is with his wraps actually slipping down working I definitely need to work on that aspect. 11-05-2022 upon evaluation today patient appears to be doing excellent in regard to his wound. He has been tolerating the dressing changes without complication. He actually seems to be making excellent progress I am extremely pleased with where things stand currently. I do believe that we are really making good progress and I think that we are on the right track towards complete closure I think the compression wrap has been a dramatic shift in a good way for him as far  as getting the wounds healed a lot of these areas that we will continue to leave Completely sealed up. 11-12-2022 upon evaluation today patient appears to be doing well currently in regard to his leg ulcer. The compression wrapping seems to be doing an excellent job. Fortunately I do not see any evidence of active infection locally nor systemically which is great news and in general I do believe that we are making good headway here towards complete closure. 11-19-2022 upon evaluation today patient appears to be doing well currently in regard to his wounds. He is actually showing signs of excellent improvement and very pleased with where we stand I think that he is making great progress. I do not see any evidence of infection at this time which is great news. 11-26-2022 upon evaluation today  patient appears to be doing well currently in regard to his wound. He is actually been tolerating the dressing changes without complication. Fortunately I do not see any evidence of active infection locally nor systemically which is great news I think he is making excellent progress here towards closure. 12-03-2022 upon evaluation today patient appears to be doing well currently in regard to his wound. He in fact at both locations is doing great and seems to be making excellent progress and very pleased in that regard. I do not see any signs of active infection at this time. Objective Constitutional Obese and well-hydrated in no acute distress. Vitals Time Taken: 12:54 PM, Height: 73 in, Weight: 358 lbs, BMI: 47.2, Temperature: 97.7 F, Pulse: 65 bpm, Respiratory Rate: 18 breaths/min, Blood Pressure: 114/77 mmHg. Respiratory normal breathing without difficulty. Psychiatric this patient is able to make decisions and demonstrates good insight into disease process. Alert and Oriented x 3. pleasant and cooperative. General Notes: Upon inspection patient's wound bed actually showed signs of good granulation epithelization at this point. Fortunately I see no signs of worsening overall I do believe that he is making excellent progress towards closure. I am extremely pleased with where we stand. Integumentary (Hair, Skin) Wound #2 status is Open. Original cause of wound was Gradually Appeared. The date acquired was: 06/15/2022. The wound has been in treatment 20 weeks. The wound is located on the Left,Circumferential Lower Leg. The wound measures 3cm length x 11.5cm width x 0.2cm depth; 27.096cm^2 area and 5.419cm^3 volume. There is Fat Layer (Subcutaneous Tissue) exposed. There is no tunneling or undermining noted. There is a medium amount of serosanguineous drainage noted. There is medium (34-66%) pink, pale granulation within the wound bed. There is a medium (34-66%) amount of necrotic tissue within the  wound bed. 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 There was a Double Layer Compression Therapy Procedure by Angelina Pih, RN. Post procedure Diagnosis Wound #2: Same as Pre-Procedure Plan 1. I would recommend that we have the patient continue to monitor for any evidence of infection or worsening. Based on what I am seeing I do think that we are making good headway towards closure. 2 I am good recommend today that the patient  should also continue to utilize the wound care measures as before. Specifically we have been using the silver cell followed by the dead skin around the edges of the wound and then AandD ointment as well to prevent any sticking. He overall is making really good progress here with the compression wrapping and very pleased. We will see patient back for reevaluation in 1 week here in the clinic. If anything worsens or changes patient will contact our office for additional recommendations. Electronic Signature(s) Signed: 12/03/2022 1:47:14 PM By: Allen Derry PA-C Entered By: Allen Derry on 12/03/2022 13:47:14 -------------------------------------------------------------------------------- SuperBill Details Patient Name: Date of Service: [name] 12/03/2022 Medical Record Number: [id number] Patient Account Number: [id number] Date of Birth/Sex: Treating RN: 1956/05/20 (67  y.o. [name] Primary Care Provider: [name] Other Clinician: Referring Provider: Treating Provider/Extender: Matthew Folks in Treatment: 20 Diagnosis Coding ICD-10 Codes Code Description E11.622 Type 2 diabetes mellitus with other skin ulcer I89.0 Lymphedema, not elsewhere classified L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 L97.812 Non-pressure chronic ulcer of other part of right lower leg with fat layer exposed I10 Essential (primary) hypertension I50.42 Chronic combined systolic (congestive) and diastolic (congestive) heart failure I49.9 Cardiac arrhythmia, unspecified ACENCION, TONI ([id number]) [id number] Page 8 of 8 Physician Procedures : CPT4 Code Description Modifier [phone] [id number] - WC PHYS LEVEL 3 - EST PT ICD-10 Diagnosis Description E11.622 Type 2 diabetes mellitus with other skin ulcer I89.0 Lymphedema, not elsewhere classified L97.822 Non-pressure chronic ulcer of other part of  left lower leg with fat layer exposed L97.812 Non-pressure chronic ulcer of other part of right lower leg with fat layer exposed Quantity: 1 Electronic Signature(s) Signed: 12/03/2022 1:47:25 PM By: Allen Derry PA-C Entered By: Allen Derry on 12/03/2022 13:47:25

## 2022-12-03 NOTE — Progress Notes (Addendum)
GERAL, COKER ([id number]) [id number] Page 1 of 9 Visit Report for 12/03/2022 Arrival Information Details Patient Name: Date of Service: [name] 12/03/2022 1:00 PM Medical Record Number: [id number] Patient Account Number: [id number] Date of Birth/Sex: Treating RN: 1955/09/13 (67 y.o. [name] Primary Care Jacque Byron: [name] Other Clinician: Referring Jerri Glauser: Treating Daniesha Driver/Extender: Matthew Folks in Treatment: 20 Visit Information History Since Last Visit All ordered tests and consults were completed: No Patient Arrived: Ambulatory Added or deleted any medications: No Arrival Time: 12:53 Any new allergies or adverse reactions: No Transfer Assistance: None Had a fall or experienced change in No Patient Identification Verified: Yes activities of daily living that may affect Secondary Verification Process Completed: Yes risk of falls: Patient Requires Transmission-Based Precautions: No Signs or symptoms of abuse/neglect since last visito No Patient Has Alerts: No Hospitalized since last visit: No Implantable device outside of the clinic excluding No cellular tissue based products placed in the center since last visit: Has Dressing in Place as Prescribed: Yes Has Compression in Place as Prescribed: Yes Pain Present Now: No Electronic Signature(s) Signed: 12/03/2022 5:12:55 PM By: [name] Entered By: [name] on 12/03/2022 12:53:39 -------------------------------------------------------------------------------- Clinic Level of Care Assessment Details Patient Name: Date of Service: [name] 12/03/2022 1:00 PM Medical Record Number: [id number] Patient Account Number: [id number] Date of Birth/Sex: Treating RN: 09-Aug-1955 (67 y.o. [name] Primary Care Dahir Ayer: [name] Other Clinician: Referring Oluwadamilola Rosamond: Treating Ayyub Krall/Extender: Matthew Folks in Treatment: 20 Clinic Level of Care Assessment Items TOOL 1 Quantity Score []  - 0 Use when EandM and Procedure is performed on INITIAL visit ASSESSMENTS - Nursing Assessment / Reassessment []  - 0 General Physical Exam (combine w/ comprehensive assessment (listed just below) when performed on new pt. [address] AKIF, WELDY ([id number]) [id number] Page 2 of 9 []  - 0 Comprehensive Assessment (HX, ROS, Risk Assessments, Wounds Hx, etc.) ASSESSMENTS - Wound and Skin Assessment / Reassessment []  - 0 Dermatologic / Skin Assessment (not related to wound area) ASSESSMENTS - Ostomy and/or Continence Assessment and Care []  - 0 Incontinence Assessment and Management []  - 0 Ostomy Care Assessment and Management (repouching, etc.) PROCESS - Coordination of Care []  - 0 Simple Patient / Family Education for ongoing care []  - 0 Complex (extensive) Patient / Family Education for ongoing care []  - 0 Staff obtains Chiropractor, Records, T Results / Process Orders est []  - 0 Staff telephones HHA, Nursing Homes / Clarify orders / etc []  - 0 Routine Transfer to another Facility (non-emergent condition) []  - 0 Routine Hospital Admission (non-emergent condition) []  - 0 New Admissions / Manufacturing engineer / Ordering NPWT Apligraf, etc. , []  - 0 Emergency Hospital Admission (emergent condition) PROCESS - Special Needs []  - 0 Pediatric / Minor Patient Management []  - 0 Isolation Patient Management []  - 0 Hearing / Language / Visual special needs []  - 0 Assessment of Community assistance (transportation, D/C planning, etc.) []  - 0 Additional assistance / Altered mentation []  - 0 Support Surface(s) Assessment (bed, cushion, seat, etc.) INTERVENTIONS - Miscellaneous []  - 0 External ear exam []  - 0 Patient Transfer (multiple staff / Nurse, adult / Similar devices) []  - 0 Simple Staple / Suture removal (25 or less) []  - 0 Complex Staple / Suture  removal (26 or more) []  - 0 Hypo/Hyperglycemic Management (do not check if billed separately) []  - 0 Ankle / Brachial Index (ABI) - do not check if billed  separately Has the patient been seen at the hospital within the last three years: Yes Total Score: 0 Level Of Care: ____ Electronic Signature(s) Signed: 12/07/2022 5:25:46 PM By: Angelina Pih Entered By: Angelina Pih on 12/07/2022 13:46:42 -------------------------------------------------------------------------------- Compression Therapy Details Patient Name: Date of Service: Matthew Burly [name] 12/03/2022 1:00 PM Medical Record Number: [id number] Patient Account Number: [id number] Date of Birth/Sex: Treating RN: 23-Nov-1955 (67 y.o. [name] Primary Care Adilee Lemme: [name] Other Clinician: TELVIN, REINDERS ([id number]) [id number] Page 3 of 9 Referring Wesam Gearhart: Treating Trevontae Lindahl/Extender: Matthew Folks in Treatment: 20 Compression Therapy Performed for Wound Assessment: Wound #2 Left,Circumferential Lower Leg Performed By: Clinician Angelina Pih, RN Compression Type: Double Layer Post Procedure Diagnosis Same as Pre-procedure Electronic Signature(s) Signed: 12/03/2022 4:16:40 PM By: Angelina Pih Entered By: Angelina Pih on 12/03/2022 13:43:55 -------------------------------------------------------------------------------- Encounter Discharge Information Details Patient Name: Date of Service: Matthew Burly [name] 12/03/2022 1:00 PM Medical Record Number: [id number] Patient Account Number: [id number] Date of Birth/Sex: Treating RN: 1956/05/17 (67 y.o. [name] Primary Care Lillith Mcneff: [name] Other Clinician: Referring Rhyland Hinderliter: Treating Amardeep Beckers/Extender: Matthew Folks in Treatment: 20 Encounter Discharge Information Items Discharge Condition: Stable Ambulatory Status: Cane Discharge Destination: Home Transportation:  Private Auto Accompanied By: self Schedule Follow-up Appointment: Yes Clinical Summary of Care: Electronic Signature(s) Signed: 12/07/2022 1:48:03 PM By: Angelina Pih Entered By: Angelina Pih on 12/07/2022 13:48:02 -------------------------------------------------------------------------------- Lower Extremity Assessment Details Patient Name: Date of Service: [name] 12/03/2022 1:00 PM Medical Record Number: [id number] Patient Account Number: [id number] Date of Birth/Sex: Treating RN: 07-Jan-1956 (67 y.o. [name] Primary Care Tenzin Edelman: [name] Other Clinician: Referring Yarisbel Miranda: Treating Fitzgerald Dunne/Extender: Matthew Folks in Treatment: 20 Edema Assessment Left: [Left: Right] [Right: :] Assessed: [Left: No] [Right: No] Edema: [Left: Ye] [Right: s] Calf Left: Right: Point of Measurement: 35 cm From Medial Instep 53.1 cm Ankle Left: Right: Point of Measurement: 10 cm From Medial Instep 29.7 cm Vascular Assessment Pulses: Dorsalis Pedis Palpable: [Left:Yes] Electronic Signature(s) Signed: 12/03/2022 4:16:40 PM By: Angelina Pih Entered By: Angelina Pih on 12/03/2022 13:15:36 -------------------------------------------------------------------------------- Multi Wound Chart Details Patient Name: Date of Service: Matthew Burly [name] 12/03/2022 1:00 PM Medical Record Number: [id number] Patient Account Number: [id number] Date of Birth/Sex: Treating RN: May 22, 1956 (67 y.o. [name] Primary Care Jefry Lesinski: [name] Other Clinician: Referring Keymoni Mccaster: Treating Aariel Ems/Extender: Matthew Folks in Treatment: 20 Vital Signs Height(in): 73 Pulse(bpm): 65 Weight(lbs): 358 Blood Pressure(mmHg): 114/77 Body Mass Index(BMI): 47.2 Temperature(F): 97.7 Respiratory Rate(breaths/min): 18 [2:Photos:] [N/A:N/A] DAYMION, NAZAIRE ([id number]) [2:Left, Circumferential Lower Leg Wound Location: Gradually  Appeared Wounding Event: Venous Leg Ulcer Primary Etiology: Diabetic Wound/Ulcer of the Lower Secondary Etiology: Extremity Arrhythmia, Congestive Heart Failure, N/A Comorbid History:  Hypertension, Type II Diabetes 06/15/2022 Date Acquired: 20 Weeks of Treatment: Open Wound Status: No Wound Recurrence: 3x11.5x0.2 Measurements L x W x D (cm) 27.096 A (cm) : rea 5.419 Volume (cm) : 95.50% % Reduction in Area: 91.00% % Reduction in Volume:  Full Thickness Without Exposed Classification: Support Structures Medium Exudate Amount: Serosanguineous Exudate Type: red, brown Exudate Color: Medium (34-66%) Granulation Amount: Pink, Pale Granulation Quality: Medium (34-66%) Necrotic Amount: Fat  Layer (Subcutaneous Tissue): Yes N/A Exposed Structures: Fascia: No Tendon: No Muscle: No Joint: No Bone: No None Epithelialization:] [N/A:N/A N/A N/A N/A N/A N/A N/A N/A N/A N/A N/A N/A N/A N/A N/A N/A N/A N/A N/A N/A N/A] Treatment Notes Electronic  Signature(s) Signed: 12/03/2022 4:16:40 PM By: Angelina Pih Entered By: Angelina Pih on 12/03/2022 13:16:37 -------------------------------------------------------------------------------- Multi-Disciplinary Care Plan Details Patient Name: Date of Service: [name], [name] 12/03/2022 1:00 PM Medical Record Number: [id number] Patient Account Number: [id number] Date of Birth/Sex: Treating RN: 11-Nov-1955 (67 y.o. [name] Primary Care Glorene Leitzke: [name] Other Clinician: Referring Gwenlyn Hottinger: Treating Arrion Burruel/Extender: Matthew Folks in Treatment: 20 Active Inactive Venous Leg Ulcer Nursing Diagnoses: Actual venous Insuffiency (use after diagnosis is confirmed) Goals: Patient will maintain optimal edema control Date Initiated: 10/15/2022 Target Resolution Date: 12/24/2022 Goal Status: Active Patient/caregiver will verbalize understanding of disease process and disease management Date Initiated: 10/15/2022 Date Inactivated:  11/12/2022 Target Resolution Date: 10/15/2022 Goal Status: Met Verify adequate tissue perfusion prior to therapeutic compression application MIGUELANGEL, KORN ([id number]) [phone].pdf Page 6 of 9 Date Initiated: 10/15/2022 Date Inactivated: 11/12/2022 Target Resolution Date: 10/15/2022 Goal Status: Met Interventions: Assess peripheral edema status every visit. Compression as ordered Provide education on venous insufficiency Treatment Activities: Therapeutic compression applied : 10/15/2022 Notes: Electronic Signature(s) Signed: 12/07/2022 1:47:16 PM By: Angelina Pih Entered By: Angelina Pih on 12/07/2022 13:47:16 -------------------------------------------------------------------------------- Pain Assessment Details Patient Name: Date of Service: [name] 12/03/2022 1:00 PM Medical Record Number: [id number] Patient Account Number: [id number] Date of Birth/Sex: Treating RN: 04/13/1956 (67 y.o. [name] Primary Care Pernie Grosso: [name] Other Clinician: Referring Torre Pikus: Treating Shataya Winkles/Extender: Matthew Folks in Treatment: 20 Active Problems Location of Pain Severity and Description of Pain Patient Has Paino No Site Locations Pain Management and Medication Current Pain Management: Electronic Signature(s) Signed: 12/03/2022 4:16:40 PM By: Angelina Pih Signed: 12/03/2022 5:12:55 PM By: [name] Entered By: [name] on 12/03/2022 12:55:48 [name] ([id number]) [id number] Page 7 of 9 -------------------------------------------------------------------------------- Patient/Caregiver Education Details Patient Name: Date of Service: [name] 6/20/2024andnbsp1:00 PM Medical Record Number: [id number] Patient Account Number: [id number] Date of Birth/Gender: Treating RN: April 09, 1956 (67 y.o. [name] Primary Care Physician: [name] Other  Clinician: Referring Physician: Treating Physician/Extender: Matthew Folks in Treatment: 20 Education Assessment Education Provided To: Patient Education Topics Provided Wound/Skin Impairment: Handouts: Caring for Your Ulcer Methods: Explain/Verbal Responses: State content correctly Electronic Signature(s) Signed: 12/07/2022 5:25:46 PM By: Angelina Pih Entered By: Angelina Pih on 12/07/2022 13:47:25 -------------------------------------------------------------------------------- Wound Assessment Details Patient Name: Date of Service: Matthew Burly [name] 12/03/2022 1:00 PM Medical Record Number: [id number] Patient Account Number: [id number] Date of Birth/Sex: Treating RN: 29-Aug-1955 (67 y.o. [name] Primary Care Sicilia Killough: [name] Other Clinician: Referring Jaedynn Bohlken: Treating Azariyah Luhrs/Extender: Matthew Folks in Treatment: 20 Wound Status Wound Number: 2 Primary Venous Leg Ulcer Etiology: Wound Location: Left, Circumferential Lower Leg Secondary Diabetic Wound/Ulcer of the Lower Extremity Wounding Event: Gradually Appeared Etiology: Date Acquired: 06/15/2022 Wound Status: Open Weeks Of Treatment: 20 Comorbid Arrhythmia, Congestive Heart Failure, Hypertension, Type Clustered Wound: No History: II Diabetes Photos MATHEO, RATHBONE ([id number]) [id number] Page 8 of 9 Wound Measurements Length: (cm) 3 Width: (cm) 11.5 Depth: (cm) 0.2 Area: (cm) 27.096 Volume: (cm) 5.419 % Reduction in Area: 95.5% % Reduction in Volume: 91% Epithelialization: None Tunneling: No Undermining: No Wound Description Classification: Full Thickness Without Exposed Suppor Exudate Amount: Medium Exudate Type: Serosanguineous Exudate Color: red, brown t Structures Foul Odor After Cleansing: No Slough/Fibrino Yes Wound Bed Granulation Amount: Medium (34-66%) Exposed Structure Granulation Quality: Pink,  Pale Fascia Exposed: No Necrotic Amount: Medium (34-66%) Fat Layer (Subcutaneous Tissue)  Exposed: Yes Tendon Exposed: No Muscle Exposed: No Joint Exposed: No Bone Exposed: No Treatment Notes Wound #2 (Lower Leg) Wound Laterality: Left, Circumferential Cleanser Soap and Water Discharge Instruction: Gently cleanse wound with antibacterial soap, rinse and pat dry prior to dressing wounds Peri-Wound Care AandD Ointment Discharge Instruction: Apply AandD Ointment to leg Desitin Maximum Strength Ointment 4 (oz) Discharge Instruction: use to anchor top of wrap Topical Primary Dressing AquacelAg Advantage Dressing, 4x5 (in/in) Discharge Instruction: Apply to wound as directed Secondary Dressing ABD Pad 5x9 (in/in) Discharge Instruction: Cover with ABD pad Secured With Compression Wrap Urgo K2 Lite, two layer compression system, large Compression Stockings Add-Ons Electronic Signature(s) Signed: 12/03/2022 4:16:40 PM By: Angelina Pih Entered By: Angelina Pih on 12/03/2022 13:15:59 [name] ([id number]) [id number] Page 9 of 9 -------------------------------------------------------------------------------- Vitals Details Patient Name: Date of Service: [name] 12/03/2022 1:00 PM Medical Record Number: [id number] Patient Account Number: [id number] Date of Birth/Sex: Treating RN: 08/03/1955 (67 y.o. [name] Primary Care Tiea Manninen: [name] Other Clinician: Referring Izzabella Besse: Treating Taline Nass/Extender: Matthew Folks in Treatment: 20 Vital Signs Time Taken: 12:54 Temperature (F): 97.7 Height (in): 73 Pulse (bpm): 65 Weight (lbs): 358 Respiratory Rate (breaths/min): 18 Body Mass Index (BMI): 47.2 Blood Pressure (mmHg): 114/77 Reference Range: 80 - 120 mg / dl Electronic Signature(s) Signed: 12/03/2022 5:12:55 PM By: [name] Entered By: [name] on 12/03/2022 12:55:44

## 2022-12-04 NOTE — Telephone Encounter (Signed)
OK   Pt should have monitor now

## 2022-12-08 ENCOUNTER — Telehealth: Payer: Self-pay | Admitting: Internal Medicine

## 2022-12-08 NOTE — Telephone Encounter (Signed)
Pt c/o medication issue:  1. Name of Medication:   Carvedilol (COREG) 12.5 MG tablet   2. How are you currently taking this medication (dosage and times per day)?   3. Are you having a reaction (difficulty breathing--STAT)?   4. What is your medication issue?   Caller stated patient also has a prescription for 6.25 mg tablets from another provider.  Caller wants to confirm the correct dosage of this medication.  Ref# [id number].

## 2022-12-08 NOTE — Telephone Encounter (Signed)
Left message with Pharmacy that the patient should be on carvedilol 12.5 mg twice daily

## 2022-12-09 NOTE — Telephone Encounter (Signed)
Caller confirmed receipt of medication clarification.

## 2022-12-10 ENCOUNTER — Encounter: Payer: PPO | Admitting: Physician Assistant

## 2022-12-10 DIAGNOSIS — I872 Venous insufficiency (chronic) (peripheral): Secondary | ICD-10-CM | POA: Diagnosis not present

## 2022-12-10 DIAGNOSIS — L97822 Non-pressure chronic ulcer of other part of left lower leg with fat layer exposed: Secondary | ICD-10-CM | POA: Diagnosis not present

## 2022-12-10 DIAGNOSIS — E11622 Type 2 diabetes mellitus with other skin ulcer: Secondary | ICD-10-CM | POA: Diagnosis not present

## 2022-12-10 NOTE — Progress Notes (Addendum)
KAJ, TODOROFF ([id number]) [id number] Page 1 of 9 Visit Report for 12/10/2022 Chief Complaint Document Details Patient Name: Date of Service: [name] 12/10/2022 12:45 PM Medical Record Number: [id number] Patient Account Number: [id number] Date of Birth/Sex: Treating RN: 11/03/1955 (67 y.o. [name] Primary Care Provider: [name] Other Clinician: Angelina [name] Referring Provider: Treating Provider/Extender: [name] in Treatment: 21 Information Obtained from: Patient Chief Complaint Bilateral LE ulcers and lymphedema Electronic Signature(s) Signed: 12/10/2022 12:34:12 PM By: [name] Entered By: [name] on 12/10/2022 12:34:12 -------------------------------------------------------------------------------- Debridement Details Patient Name: Date of Service: Matthew Macadam J. 12/10/2022 12:45 PM Medical Record Number: [id number] Patient Account Number: [id number] Date of Birth/Sex: Treating RN: 09-21-1955 (67 y.o. [name] Primary Care Provider: [name] Other Clinician: Angelina [name] Referring Provider: Treating Provider/Extender: [name] in Treatment: 21 Debridement Performed for Assessment: Wound #2 Left,Medial Lower Leg Performed By: Physician Matthew Derry, [name] Debridement Type: Debridement Severity of Tissue Pre Debridement: Fat layer exposed Level of Consciousness (Pre-procedure): Awake and Alert Pre-procedure Verification/Time Out Yes - 13:34 Taken: Start Time: 13:34 Percent of Wound Bed Debrided: 100% T Area Debrided (cm): otal 3.92 Tissue and other material debrided: Viable, Non-Viable, Slough, Subcutaneous, Biofilm, Slough Level: Skin/Subcutaneous Tissue Debridement Description: Excisional Instrument: Curette Bleeding: Minimum Hemostasis Achieved: Pressure Response to Treatment: Procedure was tolerated well Level of  Consciousness (Post- Awake and Alert procedure): [name], [name] ([id number]) [id number] Page 2 of 9 Post Debridement Measurements of Total Wound Length: (cm) 2.5 Width: (cm) 2 Depth: (cm) 0.2 Volume: (cm) 0.785 Character of Wound/Ulcer Post Debridement: Stable Severity of Tissue Post Debridement: Fat layer exposed Post Procedure Diagnosis Same as Pre-procedure Electronic Signature(s) Signed: 12/10/2022 6:18:14 PM By: [name] Signed: 12/11/2022 1:04:08 PM By: [name] Previous Signature: 12/10/2022 5:12:33 PM Version By: [name] Entered By: [name] on 12/10/2022 18:18:14 -------------------------------------------------------------------------------- HPI Details Patient Name: Date of Service: Matthew Burly [name] 12/10/2022 12:45 PM Medical Record Number: [id number] Patient Account Number: [id number] Date of Birth/Sex: Treating RN: November 19, 1955 (67 y.o. [name] Primary Care Provider: [name] Other Clinician: Angelina [name] Referring Provider: Treating Provider/Extender: [name] in Treatment: 21 History of Present Illness HPI Description: 07-16-2022 upon evaluation today patient appears to be doing poorly currently in regard to his his legs. The left leg is actually worse than the right. This is a patient that is previously been seen in the [place] office and at that point was doing really quite well with compression wrapping and often alginate are either Hydrofera Blue dressings. Fortunately he has gone since November 2020 until just current with out having any issue or need for wound care services which is great news. Unfortunately he is here today because he is having some issues at this point. Patient does have a history of several medical conditions which include diabetes mellitus type 2, lymphedema, hypertension, congestive heart failure, and cardiac arrhythmia though is not sure that  this is actually atrial fibrillation based on what he has been told. He does have lymphedema pumps but tells me he has not used them since the summer when he had to move to a remodel of his building and subsequently never unpacked them. 07-23-2022 upon evaluation today patient appears to be doing well with regard to his legs. He is going require some sharp debridement today but overall seems to be making some progress. I am pleased in that regard he  has much less drainage that he had did last week I think the compression wraps are definitely helping. 07-30-2022 upon evaluation today patient appears to be doing poorly in regard to his legs he actually has blue-green drainage which I think is consistent with Pseudomonas. I believe that we need to address this as soon as possible and I discussed that with the patient today as well. Fortunately I do not see any signs of active infection locally nor systemically at this time which is great news. No fevers, chills, nausea, vomiting, or diarrhea. 08-13-2022 upon evaluation today patient appears to be doing well currently in regard to his leg ulcers which are actually looking much better. Fortunately there does not appear to be any signs of active infection at this time which is great news. No fevers, chills, nausea, vomiting, or diarrhea. 3/7; patient with predominantly a left medial lower extremity wound as well as several smaller areas and a small area on the right lateral. His Jodie Echevaria came in today. We are using silver alginate as the primary dressing 09-03-2022 upon evaluation today patient appears to be doing well currently in regard to his wounds. He has been tolerating the dressing changes without complication. Fortunately there does not appear to be any signs of infection there is needed however for sharp debridement. 09-10-2022 upon evaluation today patient appears to be doing well currently in regard to his wound. Has been tolerating the dressing changes  without complication. Fortunately there does not appear to be any signs of infection looking systemically which is great news and overall I am extremely pleased with where we stand today. I do not see any signs of infection. 09-17-2022 upon evaluation today patient actually is making signs of improvement the good news is he is making good improvement. With that being said he still is having quite a time keeping the swelling down. We have been using Tubigrip I think that still probably our best option but nonetheless I think that we are making progress with regard to his wounds. 09-24-2022 upon evaluation today patient appears to be doing well currently in regard to his wounds is continuing to make improvements at this point which is great news and overall I am extremely pleased with where we stand today. Fortunately I do not see any evidence of active infection at this time which is great news and in general I think he is doing quite well with the [facility] and silver alginate. [name], [name] ([id number]) [id number] Page 3 of 9 10-01-2022 upon evaluation today patient appears to be doing pretty well currently in regard to his wounds. He has been tolerating the dressing changes without complication. Fortunately I do not see any evidence of active infection locally nor systemically which is great news and I do believe that 10-08-2022 upon evaluation today patient's wounds actually are showing some signs of improvement which is great news. Still this is very slow to heal I really feel like he would do better if we can get him in some stronger compression wrapping he does have the juxta lite compression wraps therefore we can actually see about doing that over top of his Tubigrip to see if that can be beneficial. He does not love the hide he had as they are not the most comfortable thing for him but nonetheless I think it would be beneficial he is in agreement with going forward  with this. 10-15-2022 upon evaluation today patient appears to be doing well currently in regard to his wounds that do  not appear to be infected which is good news. With that being said unfortunately he still continues to have significant amount of swelling we really need to do something to try to get the swelling under control. I do not see any signs of infection locally nor systemically but at the same time the amount of edema is tremendous. The juxta lite helps but again he is only taking the fluid pills once or twice a week due to the amount that makes him go to the restroom. Therefore he does not take this daily which is really what he needs on a more regular basis. We need to try to get some compression on and is a little bit stronger. 10-22-2022 upon evaluation today patient actually appears to be making some really good progress in regard to his wound. The compression wrap was put on last time actually doing a great job he looks much improved and in general I feel like that we are making great progress. I do not see any evidence of active infection locally nor systemically which is great news. 10-29-2022 upon evaluation today patient appears to be doing decently well in regard to his legs currently. I feel like that his swelling is much better I feel like that he is also doing much better in regard to the wounds in general. I do not see any signs of active infection which is great news and in general I think that he is making excellent progress towards complete resolution. The patient does seem to have 1 issue that is with his wraps actually slipping down working I definitely need to work on that aspect. 11-05-2022 upon evaluation today patient appears to be doing excellent in regard to his wound. He has been tolerating the dressing changes without complication. He actually seems to be making excellent progress I am extremely pleased with where things stand currently. I do believe that we are  really making good progress and I think that we are on the right track towards complete closure I think the compression wrap has been a dramatic shift in a good way for him as far as getting the wounds healed a lot of these areas that we will continue to leave Completely sealed up. 11-12-2022 upon evaluation today patient appears to be doing well currently in regard to his leg ulcer. The compression wrapping seems to be doing an excellent job. Fortunately I do not see any evidence of active infection locally nor systemically which is great news and in general I do believe that we are making good headway here towards complete closure. 11-19-2022 upon evaluation today patient appears to be doing well currently in regard to his wounds. He is actually showing signs of excellent improvement and very pleased with where we stand I think that he is making great progress. I do not see any evidence of infection at this time which is great news. 11-26-2022 upon evaluation today patient appears to be doing well currently in regard to his wound. He is actually been tolerating the dressing changes without complication. Fortunately I do not see any evidence of active infection locally nor systemically which is great news I think he is making excellent progress here towards closure. 12-03-2022 upon evaluation today patient appears to be doing well currently in regard to his wound. He in fact at both locations is doing great and seems to be making excellent progress and very pleased in that regard. I do not see any signs of active infection at this time. 12-10-2022  upon evaluation patient's wound is actually significantly smaller with one of the satellite lesions closed and there is just 1 small area remaining and this is actually doing quite well. I am very pleased with her progress. Electronic Signature(s) Signed: 12/10/2022 6:17:02 PM By: [name] Entered By: [name] on 12/10/2022  18:17:02 -------------------------------------------------------------------------------- Physical Exam Details Patient Name: Date of Service: [name] 12/10/2022 12:45 PM Medical Record Number: [id number] Patient Account Number: [id number] Date of Birth/Sex: Treating RN: 1955/10/07 (67 y.o. [name] Primary Care Provider: [name] Other Clinician: Angelina [name] Referring Provider: Treating Provider/Extender: [name] in Treatment: 21 Constitutional Well-nourished and well-hydrated in no acute distress. Respiratory normal breathing without difficulty. Psychiatric this patient is able to make decisions and demonstrates good insight into disease process. Alert and Oriented x 3. pleasant and cooperative. [name], [name] ([id number]) [id number] Page 4 of 9 Notes Upon inspection patient's wound bed actually showed signs of good granulation epithelization at this point. Fortunately I do not see any evidence of worsening overall and I do believe that the patient is making good headway towards closure. Electronic Signature(s) Signed: 12/10/2022 6:17:17 PM By: [name] Entered By: [name] on 12/10/2022 18:17:17 -------------------------------------------------------------------------------- Physician Orders Details Patient Name: Date of Service: Matthew Macadam J. 12/10/2022 12:45 PM Medical Record Number: [id number] Patient Account Number: [id number] Date of Birth/Sex: Treating RN: [birth year] (67 y.o. [name] Primary Care Provider: [name] Other Clinician: Angelina [name] Referring Provider: Treating Provider/Extender: [name] in Treatment: 21 Verbal / Phone Orders: Yes Clinician: Angelina [name] Read Back and Verified: Yes Diagnosis Coding ICD-10 Coding Code Description E11.622 Type 2 diabetes mellitus with other skin ulcer I89.0 Lymphedema, not  elsewhere classified L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 L97.812 Non-pressure chronic ulcer of other part of right lower leg with fat layer exposed I10 Essential (primary) hypertension I50.42 Chronic combined systolic (congestive) and diastolic (congestive) heart failure I49.9 Cardiac arrhythmia, unspecified Follow-up Appointments Return Appointment in 1 week. Nurse Visit as needed Bathing/ Shower/ Hygiene May shower with wound dressing protected with water repellent cover or cast protector. Anesthetic (Use 'Patient Medications' Section for Anesthetic Order Entry) Lidocaine applied to wound bed Edema Control - Lymphedema / Segmental Compressive Device / Other Elevate, Exercise Daily and A void Standing for Long Periods of Time. Elevate legs to the level of the heart and pump ankles as often as possible Elevate leg(s) parallel to the floor when sitting. Compression Pump: Use compression pump on left lower extremity for 60 minutes, twice daily. Compression Pump: Use compression pump on right lower extremity for 60 minutes, twice daily. DO YOUR BEST to sleep in the bed at night. DO NOT sleep in your recliner. Long hours of sitting in a recliner leads to swelling of the legs and/or potential wounds on your backside. Wound Treatment Wound #2 - Lower Leg Wound Laterality: Left, Medial Cleanser: Soap and Water 2 x Per Week/30 Days Discharge Instructions: Gently cleanse wound with antibacterial soap, rinse and pat dry prior to dressing wounds Peri-Wound Care: AandD Ointment 2 x Per Week/30 Days Discharge Instructions: Apply AandD Ointment to leg Peri-Wound Care: Desitin Maximum Strength Ointment 4 (oz) 2 x Per Week/30 Days Discharge Instructions: use to anchor top of wrap Prim Dressing: AquacelAg Advantage Dressing, 4x5 (in/in) 2 x Per Week/30 Days ary Discharge Instructions: Apply to wound as directed [name], [name] ([id number])  [id number]  Page 5 of 9 Secondary Dressing: ABD Pad 5x9 (in/in) 2 x Per Week/30 Days Discharge Instructions: Cover with ABD pad Compression Wrap: Urgo K2 Lite, two layer compression system, large 2 x Per Week/30 Days Electronic Signature(s) Signed: 12/11/2022 11:45:42 AM By: [name] Signed: 12/11/2022 1:40:48 PM By: [name] Entered By: [name] on 12/10/2022 13:38:39 -------------------------------------------------------------------------------- Problem List Details Patient Name: Date of Service: Matthew Macadam J. 12/10/2022 12:45 PM Medical Record Number: [id number] Patient Account Number: [id number] Date of Birth/Sex: Treating RN: 1955/10/12 (67 y.o. [name] Primary Care Provider: [name] Other Clinician: Angelina [name] Referring Provider: Treating Provider/Extender: [name] in Treatment: 21 Active Problems ICD-10 Encounter Code Description Active Date MDM Diagnosis E11.622 Type 2 diabetes mellitus with other skin ulcer 07/16/2022 No Yes I89.0 Lymphedema, not elsewhere classified 07/16/2022 No Yes L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2/06/2022 No Yes L97.812 Non-pressure chronic ulcer of other part of right lower leg with fat layer 07/16/2022 No Yes exposed I10 Essential (primary) hypertension 07/16/2022 No Yes I50.42 Chronic combined systolic (congestive) and diastolic (congestive) heart failure 07/16/2022 No Yes I49.9 Cardiac arrhythmia, unspecified 07/16/2022 No Yes Inactive Problems Resolved Problems [name], [name] ([id number]) [id number] Page 6 of 9 Electronic Signature(s) Signed: 12/10/2022 12:34:07 PM By: [name] Entered By: [name] on 12/10/2022 12:34:07 -------------------------------------------------------------------------------- Progress Note Details Patient Name: Date of Service: [name],  Matthew [name] 12/10/2022 12:45 PM Medical Record Number: [id number] Patient Account Number: [id number] Date of Birth/Sex: Treating RN: 16-Jan-1956 (67 y.o. [name] Primary Care Provider: [name] Other Clinician: Angelina [name] Referring Provider: Treating Provider/Extender: [name] in Treatment: 21 Subjective Chief Complaint Information obtained from Patient Bilateral LE ulcers and lymphedema History of Present Illness (HPI) 07-16-2022 upon evaluation today patient appears to be doing poorly currently in regard to his his legs. The left leg is actually worse than the right. This is a patient that is previously been seen in the [place] office and at that point was doing really quite well with compression wrapping and often alginate are either Hydrofera Blue dressings. Fortunately he has gone since November 2020 until just current with out having any issue or need for wound care services which is great news. Unfortunately he is here today because he is having some issues at this point. Patient does have a history of several medical conditions which include diabetes mellitus type 2, lymphedema, hypertension, congestive heart failure, and cardiac arrhythmia though is not sure that this is actually atrial fibrillation based on what he has been told. He does have lymphedema pumps but tells me he has not used them since the summer when he had to move to a remodel of his building and subsequently never unpacked them. 07-23-2022 upon evaluation today patient appears to be doing well with regard to his legs. He is going require some sharp debridement today but overall seems to be making some progress. I am pleased in that regard he has much less drainage that he had did last week I think the compression wraps are definitely helping. 07-30-2022 upon evaluation today patient appears to be doing poorly in regard to his legs he actually has blue-green drainage which I  think is consistent with Pseudomonas. I believe that we need to address this as soon as possible and I discussed that with the patient today as well. Fortunately I do not see any signs of active infection  locally nor systemically at this time which is great news. No fevers, chills, nausea, vomiting, or diarrhea. 08-13-2022 upon evaluation today patient appears to be doing well currently in regard to his leg ulcers which are actually looking much better. Fortunately there does not appear to be any signs of active infection at this time which is great news. No fevers, chills, nausea, vomiting, or diarrhea. 3/7; patient with predominantly a left medial lower extremity wound as well as several smaller areas and a small area on the right lateral. His Jodie Echevaria came in today. We are using silver alginate as the primary dressing 09-03-2022 upon evaluation today patient appears to be doing well currently in regard to his wounds. He has been tolerating the dressing changes without complication. Fortunately there does not appear to be any signs of infection there is needed however for sharp debridement. 09-10-2022 upon evaluation today patient appears to be doing well currently in regard to his wound. Has been tolerating the dressing changes without complication. Fortunately there does not appear to be any signs of infection looking systemically which is great news and overall I am extremely pleased with where we stand today. I do not see any signs of infection. 09-17-2022 upon evaluation today patient actually is making signs of improvement the good news is he is making good improvement. With that being said he still is having quite a time keeping the swelling down. We have been using Tubigrip I think that still probably our best option but nonetheless I think that we are making progress with regard to his wounds. 09-24-2022 upon evaluation today patient appears to be doing well currently in regard to his wounds is  continuing to make improvements at this point which is great news and overall I am extremely pleased with where we stand today. Fortunately I do not see any evidence of active infection at this time which is great news and in general I think he is doing quite well with the [facility] and silver alginate. 10-01-2022 upon evaluation today patient appears to be doing pretty well currently in regard to his wounds. He has been tolerating the dressing changes without complication. Fortunately I do not see any evidence of active infection locally nor systemically which is great news and I do believe that 10-08-2022 upon evaluation today patient's wounds actually are showing some signs of improvement which is great news. Still this is very slow to heal I really feel like he would do better if we can get him in some stronger compression wrapping he does have the juxta lite compression wraps therefore we can actually see about doing that over top of his Tubigrip to see if that can be beneficial. He does not love the hide he had as they are not the most comfortable thing for him but nonetheless I think it would be beneficial he is in agreement with going forward with this. 10-15-2022 upon evaluation today patient appears to be doing well currently in regard to his wounds that do not appear to be infected which is good news. With that being said unfortunately he still continues to have significant amount of swelling we really need to do something to try to get the swelling under control. I do not see any signs of infection locally nor systemically but at the same time the amount of edema is tremendous. The juxta lite helps but again he is only taking the fluid pills once or twice a week due to the amount that makes him go to the  restroom. Therefore he does not take this daily which is really what he [name], [name] ([id number]) [id number] Page 7 of 9 needs on a more regular basis. We need to  try to get some compression on and is a little bit stronger. 10-22-2022 upon evaluation today patient actually appears to be making some really good progress in regard to his wound. The compression wrap was put on last time actually doing a great job he looks much improved and in general I feel like that we are making great progress. I do not see any evidence of active infection locally nor systemically which is great news. 10-29-2022 upon evaluation today patient appears to be doing decently well in regard to his legs currently. I feel like that his swelling is much better I feel like that he is also doing much better in regard to the wounds in general. I do not see any signs of active infection which is great news and in general I think that he is making excellent progress towards complete resolution. The patient does seem to have 1 issue that is with his wraps actually slipping down working I definitely need to work on that aspect. 11-05-2022 upon evaluation today patient appears to be doing excellent in regard to his wound. He has been tolerating the dressing changes without complication. He actually seems to be making excellent progress I am extremely pleased with where things stand currently. I do believe that we are really making good progress and I think that we are on the right track towards complete closure I think the compression wrap has been a dramatic shift in a good way for him as far as getting the wounds healed a lot of these areas that we will continue to leave Completely sealed up. 11-12-2022 upon evaluation today patient appears to be doing well currently in regard to his leg ulcer. The compression wrapping seems to be doing an excellent job. Fortunately I do not see any evidence of active infection locally nor systemically which is great news and in general I do believe that we are making good headway here towards complete closure. 11-19-2022 upon evaluation today patient appears to be  doing well currently in regard to his wounds. He is actually showing signs of excellent improvement and very pleased with where we stand I think that he is making great progress. I do not see any evidence of infection at this time which is great news. 11-26-2022 upon evaluation today patient appears to be doing well currently in regard to his wound. He is actually been tolerating the dressing changes without complication. Fortunately I do not see any evidence of active infection locally nor systemically which is great news I think he is making excellent progress here towards closure. 12-03-2022 upon evaluation today patient appears to be doing well currently in regard to his wound. He in fact at both locations is doing great and seems to be making excellent progress and very pleased in that regard. I do not see any signs of active infection at this time. 12-10-2022 upon evaluation patient's wound is actually significantly smaller with one of the satellite lesions closed and there is just 1 small area remaining and this is actually doing quite well. I am very pleased with her progress. Objective Constitutional Well-nourished and well-hydrated in no acute distress. Vitals Time Taken: 12:54 PM, Height: 73 in, Weight: 358 lbs, BMI: 47.2, Temperature: 98.0 F, Pulse: 77 bpm, Respiratory Rate: 18 breaths/min, Blood Pressure: 117/77 mmHg. Respiratory normal breathing without  difficulty. Psychiatric this patient is able to make decisions and demonstrates good insight into disease process. Alert and Oriented x 3. pleasant and cooperative. General Notes: Upon inspection patient's wound bed actually showed signs of good granulation epithelization at this point. Fortunately I do not see any evidence of worsening overall and I do believe that the patient is making good headway towards closure. Integumentary (Hair, Skin) Wound #2 status is Open. Original cause of wound was Gradually Appeared. The date acquired  was: 06/15/2022. The wound has been in treatment 21 weeks. The wound is located on the Left,Medial Lower Leg. The wound measures 2.5cm length x 2cm width x 0.1cm depth; 3.927cm^2 area and 0.393cm^3 volume. There is Fat Layer (Subcutaneous Tissue) exposed. There is a medium amount of serosanguineous drainage noted. There is medium (34-66%) pink, pale granulation within the wound bed. There is a medium (34-66%) amount of necrotic tissue within the wound bed. 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 There was a Excisional Skin/Subcutaneous Tissue Debridement with a total area of 3.92 sq cm performed by Matthew Derry, [name]. With the following instrument(s): Curette to remove Viable and Non-Viable tissue/material. Material removed includes Subcutaneous Tissue, Slough, and Biofilm. A time out was conducted at 13:34, prior to the start of the procedure. A Minimum amount of bleeding was controlled with Pressure. The procedure was tolerated well. Post Debridement Measurements: 2.5cm length x 2cm width x 0.2cm depth; 0.785cm^3 volume. Character of Wound/Ulcer Post Debridement is stable. Severity of Tissue Post Debridement is: Fat layer exposed. 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 There was a Double Layer Compression Therapy Procedure with a pre-treatment ABI of 0.9 by [name]. Post procedure Diagnosis Wound #2: Same as Pre-Procedure Plan Follow-up Appointments: Return Appointment in 1 week. Nurse Visit as needed Bathing/ Shower/ Hygiene: May shower with wound dressing protected with water repellent cover or cast protector. Anesthetic (Use 'Patient Medications' Section for Anesthetic Order Entry): Lidocaine applied to wound bed Edema Control - Lymphedema / Segmental Compressive Device / Other: Elevate, Exercise Daily and Avoid Standing for Long Periods of Time. Elevate legs to the level of the heart and pump ankles as often as possible Elevate leg(s) parallel to the floor when sitting. Compression Pump: Use compression pump on left lower extremity for 60 minutes, twice daily. Compression Pump: Use compression pump on right lower extremity for 60 minutes, twice daily. DO YOUR BEST to sleep in the bed at night. DO NOT sleep in your recliner. Long hours of sitting in a recliner leads to swelling of the legs and/or potential wounds on your backside. WOUND #2: - Lower Leg Wound Laterality: Left, Medial Cleanser: Soap and Water 2 x Per Week/30 Days Discharge Instructions: Gently cleanse wound with antibacterial soap, rinse and pat dry prior to dressing wounds Peri-Wound Care: AandD Ointment 2 x Per Week/30 Days Discharge Instructions: Apply AandD Ointment to leg  Peri-Wound Care: Desitin Maximum Strength Ointment 4 (oz) 2 x Per Week/30 Days Discharge Instructions: use to anchor top of wrap Prim Dressing: AquacelAg Advantage Dressing, 4x5 (in/in) 2 x Per Week/30 Days ary Discharge Instructions: Apply to wound as directed Secondary Dressing: ABD Pad 5x9 (in/in) 2 x Per Week/30 Days Discharge Instructions: Cover with ABD pad Com pression Wrap: Urgo K2 Lite, two layer compression system, large 2 x Per Week/30 Days 1. I am going to suggest  that we continue currently with the dressings as before this includes a silver alginate followed by the ABD pad and the Urgo K2 lite compression wrap. 2. I would recommend as well he should continue to elevate his legs. 3. I am also can recommend that he should continue to monitor for any signs of worsening infection. Office if anything changes he knows contact the office let me know. We will see patient back for reevaluation in 1 week here in the clinic. If anything worsens or changes patient will contact our office for additional recommendations. Electronic Signature(s) Signed: 12/10/2022 6:18:25 PM By: [name] Previous Signature: 12/10/2022 6:17:47 PM Version By: [name] Entered By: [name] on 12/10/2022 18:18:25 -------------------------------------------------------------------------------- SuperBill Details Patient Name: Date of Service: Matthew Burly [name] 12/10/2022 Medical Record Number: [id number] Patient Account Number: [id number] Date of Birth/Sex: Treating RN: 07-03-1955 (67 y.o. Christion, [place] ([id number]) [id number] Page 9 of 9 Primary Care Provider: [name] Other Clinician: Angelina [name] Referring Provider: Treating Provider/Extender: [name] in Treatment: 21 Diagnosis Coding ICD-10 Codes Code Description E11.622 Type 2 diabetes mellitus with other skin ulcer I89.0 Lymphedema, not elsewhere classified L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 L97.812 Non-pressure chronic ulcer of other part of right lower leg with fat layer exposed I10 Essential (primary) hypertension I50.42 Chronic combined systolic (congestive) and diastolic (congestive) heart failure I49.9 Cardiac arrhythmia, unspecified Facility Procedures : CPT4 Code: [id number] Description: 11042 - DEB SUBQ TISSUE 20 SQ CM/< ICD-10 Diagnosis Description L97.822  Non-pressure chronic ulcer of other part of left lower leg with fat layer expo Modifier: sed Quantity: 1 Physician Procedures : CPT4 Code Description Modifier [id number] 11042 - WC PHYS SUBQ TISS 20 SQ CM ICD-10 Diagnosis Description L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 Quantity: 1 Electronic Signature(s) Signed: 12/10/2022 6:18:40 PM By: [name] Entered By: [name] on 12/10/2022 18:18:39

## 2022-12-10 NOTE — Progress Notes (Addendum)
Matthew, [name] ([id number]) [id number] Page 1 of 9 Visit Report for 12/10/2022 Arrival Information Details Patient Name: Date of Service: [name] 12/10/2022 12:45 PM Medical Record Number: [id number] Patient Account Number: [id number] Date of Birth/Sex: Treating RN: [birth year] (67 y.o. [name] Primary Care Aracelly Tencza: [name] Other Clinician: Angelina Pih Referring Adriana Quinby: Treating Saesha Llerenas/Extender: Matthew Folks in Treatment: 21 Visit Information History Since Last Visit All ordered tests and consults were completed: No Patient Arrived: [name] Added or deleted any medications: No Arrival Time: 12:50 Any new allergies or adverse reactions: No Transfer Assistance: None Had a fall or experienced change in No Patient Identification Verified: Yes activities of daily living that may affect Secondary Verification Process Completed: Yes risk of falls: Patient Requires Transmission-Based Precautions: No Signs or symptoms of abuse/neglect since last visito No Patient Has Alerts: No Hospitalized since last visit: No Implantable device outside of the clinic excluding No cellular tissue based products placed in the center since last visit: Has Dressing in Place as Prescribed: Yes Has Compression in Place as Prescribed: Yes Pain Present Now: No Electronic Signature(s) Signed: 12/11/2022 11:45:42 AM By: [name] Entered By: [name] on 12/10/2022 12:54:35 -------------------------------------------------------------------------------- Clinic Level of Care Assessment Details Patient Name: Date of Service: [name] 12/10/2022 12:45 PM Medical Record Number: [id number] Patient Account Number: [id number] Date of Birth/Sex: Treating RN: [name] (67 y.o. [name] Primary Care [name]: [name] Other Clinician: Angelina Pih Referring [name]: Treating [name]/Extender:  Matthew Folks in Treatment: 21 Clinic Level of Care Assessment Items TOOL 1 Quantity Score []  - 0 Use when EandM and Procedure is performed on INITIAL visit ASSESSMENTS - Nursing Assessment / Reassessment []  - 0 General Physical Exam (combine w/ comprehensive assessment (listed just below) when performed on new pt. [address]RAISTLIN, [name] ([id number]) [id number] Page 2 of 9 []  - 0 Comprehensive Assessment (HX, ROS, Risk Assessments, Wounds Hx, etc.) ASSESSMENTS - Wound and Skin Assessment / Reassessment []  - 0 Dermatologic / Skin Assessment (not related to wound area) ASSESSMENTS - Ostomy and/or Continence Assessment and Care []  - 0 Incontinence Assessment and Management []  - 0 Ostomy Care Assessment and Management (repouching, etc.) PROCESS - Coordination of Care []  - 0 Simple Patient / Family Education for ongoing care []  - 0 Complex (extensive) Patient / Family Education for ongoing care []  - 0 Staff obtains Chiropractor, Records, T Results / Process Orders est []  - 0 Staff telephones HHA, Nursing Homes / Clarify orders / etc []  - 0 Routine Transfer to another Facility (non-emergent condition) []  - 0 Routine Hospital Admission (non-emergent condition) []  - 0 New Admissions / Manufacturing engineer / Ordering NPWT Apligraf, etc. , []  - 0 Emergency Hospital Admission (emergent condition) PROCESS - Special Needs []  - 0 Pediatric / Minor Patient Management []  - 0 Isolation Patient Management []  - 0 Hearing / Language / Visual special needs []  - 0 Assessment of Community assistance (transportation, D/C planning, etc.) []  - 0 Additional assistance / Altered mentation []  - 0 Support Surface(s) Assessment (bed, cushion, seat, etc.) INTERVENTIONS - Miscellaneous []  - 0 External ear exam []  - 0 Patient Transfer (multiple staff / Nurse, adult / Similar devices) []  - 0 Simple Staple / Suture removal (25 or less) []  -  0 Complex Staple / Suture removal (26 or more) []  - 0 Hypo/Hyperglycemic Management (do not check if billed separately) []  - 0 Ankle / Brachial Index (ABI) - do  not check if billed separately Has the patient been seen at the hospital within the last three years: Yes Total Score: 0 Level Of Care: ____ Electronic Signature(s) Signed: 12/11/2022 11:45:42 AM By: [name] Entered By: [name] on 12/10/2022 13:39:47 -------------------------------------------------------------------------------- Compression Therapy Details Patient Name: Date of Service: [name]. 12/10/2022 12:45 PM Medical Record Number: [id number] Patient Account Number: [id number] Date of Birth/Sex: Treating RN: [name] (67 y.o. [name] Primary Care Trayson Stitely: [name] Other Clinician: Kasem, Hafele ([id number]) [id number] Page 3 of 9 Referring Airabella Barley: Treating Mariann Palo/Extender: Matthew Folks in Treatment: 21 Compression Therapy Performed for Wound Assessment: Wound #2 Left,Medial Lower Leg Performed By: Farrel Gordon, Angie, Compression Type: Double Layer Pre Treatment ABI: 0.9 Post Procedure Diagnosis Same as Pre-procedure Electronic Signature(s) Signed: 12/11/2022 11:45:42 AM By: [name] Entered By: [name] on 12/10/2022 13:37:30 -------------------------------------------------------------------------------- Encounter Discharge Information Details Patient Name: Date of Service: Matthew Macadam J. 12/10/2022 12:45 PM Medical Record Number: [id number] Patient Account Number: [id number] Date of Birth/Sex: Treating RN: [name] (67 y.o. [name] Primary Care Decklin Weddington: [name] Other Clinician: Angelina Pih Referring Idabell Picking: Treating Jermany Sundell/Extender: Matthew Folks in Treatment: 21 Encounter Discharge Information Items Post Procedure  Vitals Discharge Condition: Stable Temperature (F): 98.0 Ambulatory Status: Cane Pulse (bpm): 77 Discharge Destination: Home Respiratory Rate (breaths/min): 18 Transportation: Private Auto Blood Pressure (mmHg): 117/77 Accompanied By: self Schedule Follow-up Appointment: Yes Clinical Summary of Care: Electronic Signature(s) Signed: 12/11/2022 11:45:42 AM By: [name] Entered By: [name] on 12/10/2022 13:51:08 -------------------------------------------------------------------------------- Lower Extremity Assessment Details Patient Name: Date of Service: [name] 12/10/2022 12:45 PM Medical Record Number: [id number] Patient Account Number: [id number] Date of Birth/Sex: Treating RN: [birth year] (67 y.o. [name] Primary Care Sevag Shearn: [name] Other Clinician: Angelina Pih Referring Candra Wegner: Treating Charlize Hathaway/Extender: Matthew Folks in Treatment: [address] ([id number]) [id number] Page 4 of 9 Edema Assessment Assessed: [Left: Yes] [Right: No] [Left: Edema] [Right: :] Calf Left: Right: Point of Measurement: 35 cm From Medial Instep 54 cm Ankle Left: Right: Point of Measurement: 10 cm From Medial Instep 29 cm Vascular Assessment Pulses: Dorsalis Pedis Palpable: [Left:Yes] Electronic Signature(s) Signed: 12/10/2022 5:12:33 PM By: Angelina Pih Signed: 12/11/2022 11:45:42 AM By: [name] Entered By: [name] on 12/10/2022 13:06:30 -------------------------------------------------------------------------------- Multi Wound Chart Details Patient Name: Date of Service: [name] 12/10/2022 12:45 PM Medical Record Number: [id number] Patient Account Number: [id number] Date of Birth/Sex: Treating RN: [name][birth year] (67 y.o. 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/A N/A Secondary Etiology: Extremity Arrhythmia, Congestive Heart Failure, N/A N/A Comorbid History: Hypertension, Type II Diabetes 06/15/2022 N/A N/A Date Acquired: KEIVEN, ACHUFF ([id number]) [id number] Page 5 of 9 21 N/A N/A Weeks of Treatment: Open N/A N/A Wound Status: No N/A N/A Wound Recurrence: 1.8x10.7x0.1 N/A N/A Measurements L x W x D (cm) 15.127 N/A N/A A (cm) : rea 1.513 N/A N/A Volume (cm) : 97.50% N/A N/A % Reduction in Area: 97.50% N/A N/A % Reduction in Volume: Full Thickness  Without Exposed N/A N/A Classification: Support Structures Medium N/A N/A Exudate Amount: Serosanguineous N/A N/A Exudate Type: red, brown N/A N/A Exudate Color: Medium (34-66%) N/A N/A Granulation Amount: Pink, Pale N/A N/A Granulation Quality: Medium (34-66%) N/A N/A Necrotic Amount: Fat Layer (Subcutaneous Tissue): Yes N/A N/A Exposed Structures: Fascia: No Tendon: No Muscle: No Joint: No Bone: No None N/A N/A Epithelialization: Treatment Notes Electronic Signature(s) Signed: 12/11/2022 11:45:42 AM By: [name] Entered By: [name] on 12/10/2022 13:06:50 -------------------------------------------------------------------------------- Multi-Disciplinary Care Plan Details Patient Name: Date of Service: [name] 12/10/2022 12:45 PM Medical Record Number: [id number] Patient Account Number: [id number] Date of Birth/Sex: Treating RN: [name] (67 y.o. [name] Primary Care Deedee Lybarger: [name] Other Clinician: Angelina Pih Referring Maddox Bratcher: Treating Jaria Conway/Extender: Matthew Folks in Treatment: 21 Active Inactive Venous Leg Ulcer Nursing Diagnoses: Actual venous Insuffiency (use after diagnosis is confirmed) Goals: Patient will maintain optimal edema control Date Initiated: 10/15/2022 Target Resolution Date: 12/24/2022 Goal Status: Active Patient/caregiver will verbalize understanding of disease process and disease management Date Initiated: 10/15/2022 Date Inactivated: 11/12/2022 Target Resolution Date: 10/15/2022 Goal Status: Met Verify adequate tissue perfusion prior to therapeutic compression application Date Initiated: 10/15/2022 Date Inactivated: 11/12/2022 Target Resolution Date: 10/15/2022 Goal Status: Met Interventions: Assess peripheral edema status every visit. Compression as ordered Provide education on venous insufficiency SHABAZZ, WEIMAN ([id number]) [id number] Page 6 of 9 Treatment Activities: Therapeutic compression applied : 10/15/2022 Notes: Electronic Signature(s) Signed: 12/10/2022 5:12:33 PM By: Angelina Pih Signed: 12/11/2022 11:45:42 AM By: [name] Entered By: [name] on 12/10/2022 13:40:00 -------------------------------------------------------------------------------- Pain Assessment Details Patient Name: Date of Service: Matthew Macadam J. 12/10/2022 12:45 PM Medical Record Number: [id number] Patient Account Number: [id number] Date of Birth/Sex: Treating RN: [birth year] [name] (67 y.o. [name] Primary Care Muneer Leider: [name] Other Clinician: Angelina Pih Referring Koy Lamp: Treating Saburo Luger/Extender: Matthew Folks in Treatment: 21 Active Problems Location of Pain Severity and Description of Pain Patient Has Paino No Site Locations Pain Management and Medication Current Pain Management: Electronic Signature(s) Signed: 12/10/2022 5:12:33  PM By: Angelina Pih Signed: 12/11/2022 11:45:42 AM By: [name] Entered By: [name] on 12/10/2022 12:56:58 [name] ([id number]) [id number] Page 7 of 9 -------------------------------------------------------------------------------- Patient/Caregiver Education Details Patient Name: Date of Service: [name] 6/27/2024andnbsp12:45 PM Medical Record Number: [id number] Patient Account Number: [id number] Date of Birth/Gender: Treating RN: [name][birth year] (67 y.o. [name] Primary Care Physician: [name] Other Clinician: Angelina Pih Referring Physician: Treating Physician/Extender: Matthew Folks in Treatment: 21 Education Assessment Education Provided To: Patient Education Topics Provided Wound/Skin Impairment: Handouts: Other: continue wound care as directed Methods: Explain/Verbal Responses: State content correctly Electronic Signature(s) Signed: 12/11/2022 11:45:42 AM By: [name] Entered By: [name] on 12/10/2022 13:40:19 -------------------------------------------------------------------------------- Wound Assessment Details Patient Name: Date of Service: [name] 12/10/2022 12:45 PM Medical Record Number: [id number] Patient Account Number: [id number] Date of Birth/Sex: Treating RN: [name] (67 y.o. [name] Primary Care Saqib Cazarez: [name] Other Clinician: Angelina Pih Referring Alynna Hargrove: Treating Geetika Laborde/Extender: Matthew Folks in Treatment: 21 Wound Status Wound Number: 2 Primary Etiology: Venous Leg Ulcer Wound Location: Left, Medial Lower Leg Secondary Diabetic Wound/Ulcer of the Lower Extremity Etiology: Wounding Event: Gradually Appeared Wound Status: Open Date Acquired: 06/15/2022 Comorbid Arrhythmia, Congestive Heart Failure, Hypertension, Type Weeks Of Treatment: 21 History: II Diabetes Clustered Wound:  No Photos JADARIEN, BRISK ([id number]) [id number] Page 8 of  9 Wound Measurements Length: (cm) 2.5 Width: (cm) 2 Depth: (cm) 0.1 Area: (cm) 3.927 Volume: (cm) 0.393 % Reduction in Area: 99.3% % Reduction in Volume: 99.3% Epithelialization: None Wound Description Classification: Full Thickness Without Exposed Suppor Exudate Amount: Medium Exudate Type: Serosanguineous Exudate Color: red, brown t Structures Foul Odor After Cleansing: No Slough/Fibrino Yes Wound Bed Granulation Amount: Medium (34-66%) Exposed Structure Granulation Quality: Pink, Pale Fascia Exposed: No Necrotic Amount: Medium (34-66%) Fat Layer (Subcutaneous Tissue) Exposed: Yes Tendon Exposed: No Muscle Exposed: No Joint Exposed: No Bone Exposed: No Treatment Notes Wound #2 (Lower Leg) Wound Laterality: Left, Medial Cleanser Soap and Water Discharge Instruction: Gently cleanse wound with antibacterial soap, rinse and pat dry prior to dressing wounds Peri-Wound Care AandD Ointment Discharge Instruction: Apply AandD Ointment to leg Desitin Maximum Strength Ointment 4 (oz) Discharge Instruction: use to anchor top of wrap Topical Primary Dressing AquacelAg Advantage Dressing, 4x5 (in/in) Discharge Instruction: Apply to wound as directed Secondary Dressing ABD Pad 5x9 (in/in) Discharge Instruction: Cover with ABD pad Secured With Compression Wrap Urgo K2 Lite, two layer compression system, large Compression Stockings Add-Ons Electronic Signature(s) Signed: 12/10/2022 5:12:33 PM By: Angelina Pih Signed: 12/11/2022 11:45:42 AM By: [name] Entered By: [name] on 12/10/2022 13:35:07 [name] ([id number]) [id number] Page 9 of 9 -------------------------------------------------------------------------------- Vitals Details Patient Name: Date of Service: [name] 12/10/2022 12:45 PM Medical Record Number:  [id number] Patient Account Number: [id number] Date of Birth/Sex: Treating RN: [birth year] [name] (67 y.o. [name] Primary Care Chloe Flis: [name] Other Clinician: Angelina Pih Referring Sekai Nayak: Treating Harjit Douds/Extender: Matthew Folks in Treatment: 21 Vital Signs Time Taken: 12:54 Temperature (F): 98.0 Height (in): 73 Pulse (bpm): 77 Weight (lbs): 358 Respiratory Rate (breaths/min): 18 Body Mass Index (BMI): 47.2 Blood Pressure (mmHg): 117/77 Reference Range: 80 - 120 mg / dl Electronic Signature(s) Signed: 12/11/2022 11:45:42 AM By: [name] Entered By: [name] on 12/10/2022 12:56:48

## 2022-12-15 ENCOUNTER — Encounter: Payer: PPO | Attending: Physician Assistant | Admitting: Physician Assistant

## 2022-12-15 DIAGNOSIS — I5042 Chronic combined systolic (congestive) and diastolic (congestive) heart failure: Secondary | ICD-10-CM | POA: Insufficient documentation

## 2022-12-15 DIAGNOSIS — E11622 Type 2 diabetes mellitus with other skin ulcer: Secondary | ICD-10-CM | POA: Diagnosis not present

## 2022-12-15 DIAGNOSIS — I499 Cardiac arrhythmia, unspecified: Secondary | ICD-10-CM | POA: Diagnosis not present

## 2022-12-15 DIAGNOSIS — I110 Hypertensive heart disease with heart failure: Secondary | ICD-10-CM | POA: Insufficient documentation

## 2022-12-15 DIAGNOSIS — L97812 Non-pressure chronic ulcer of other part of right lower leg with fat layer exposed: Secondary | ICD-10-CM | POA: Insufficient documentation

## 2022-12-15 DIAGNOSIS — I890 Lymphedema, not elsewhere classified: Secondary | ICD-10-CM | POA: Diagnosis not present

## 2022-12-15 DIAGNOSIS — L97822 Non-pressure chronic ulcer of other part of left lower leg with fat layer exposed: Secondary | ICD-10-CM | POA: Insufficient documentation

## 2022-12-15 DIAGNOSIS — I872 Venous insufficiency (chronic) (peripheral): Secondary | ICD-10-CM | POA: Diagnosis not present

## 2022-12-15 NOTE — Progress Notes (Signed)
[place]ORD, [name] ([id number]) [id number] Page 1 of 9 Visit Report for 12/15/2022 Arrival Information Details Patient Name: Date of Service: [name] 12/15/2022 12:00 PM Medical Record Number: [id number] Patient Account Number: [id number] Date of Birth/Sex: Treating RN: Apr 24, 1956 (67 y.o. [name] Primary Care Yilin Weedon: [name] Other Clinician: Referring Damiya Sandefur: Treating Torion Hulgan/Extender: Matthew Folks in Treatment: 21 Visit Information History Since Last Visit Added or deleted any medications: No Patient Arrived: [name] Any new allergies or adverse reactions: No Arrival Time: 12:15 Had a fall or experienced change in No Accompanied By: self activities of daily living that may affect Transfer Assistance: None risk of falls: Patient Identification Verified: Yes Hospitalized since last visit: No Secondary Verification Process Completed: Yes Has Dressing in Place as Prescribed: Yes Patient Requires Transmission-Based Precautions: No Has Compression in Place as Prescribed: Yes Patient Has Alerts: No Pain Present Now: No Electronic Signature(s) Signed: 12/15/2022 4:14:56 PM By: Angelina Pih Entered By: Angelina Pih on 12/15/2022 12:16:02 -------------------------------------------------------------------------------- Clinic Level of Care Assessment Details Patient Name: Date of Service: [name] 12/15/2022 12:00 PM Medical Record Number: [id number] Patient Account Number: [id number] Date of Birth/Sex: Treating RN: 03/02/1956 (67 y.o. [name] Primary Care Elwood Bazinet: [name] Other Clinician: Referring Xylan Sheils: Treating Sharai Overbay/Extender: Matthew Folks in Treatment: 21 Clinic Level of Care Assessment Items TOOL 1 Quantity Score []  - 0 Use when EandM and Procedure is performed on INITIAL visit ASSESSMENTS - Nursing Assessment / Reassessment []  - 0 General  Physical Exam (combine w/ comprehensive assessment (listed just below) when performed on new pt. evals) []  - 0 Comprehensive Assessment (HX, ROS, Risk Assessments, Wounds Hx, etc.) ASSESSMENTS - Wound and Skin Assessment / Reassessment []  - 0 Dermatologic / Skin Assessment (not related to wound area) [name] ([id number]) [id number] Page 2 of 9 ASSESSMENTS - Ostomy and/or Continence Assessment and Care []  - 0 Incontinence Assessment and Management []  - 0 Ostomy Care Assessment and Management (repouching, etc.) PROCESS - Coordination of Care []  - 0 Simple Patient / Family Education for ongoing care []  - 0 Complex (extensive) Patient / Family Education for ongoing care []  - 0 Staff obtains Chiropractor, Records, T Results / Process Orders est []  - 0 Staff telephones HHA, Nursing Homes / Clarify orders / etc []  - 0 Routine Transfer to another Facility (non-emergent condition) []  - 0 Routine Hospital Admission (non-emergent condition) []  - 0 New Admissions / Manufacturing engineer / Ordering NPWT Apligraf, etc. , []  - 0 Emergency Hospital Admission (emergent condition) PROCESS - Special Needs []  - 0 Pediatric / Minor Patient Management []  - 0 Isolation Patient Management []  - 0 Hearing / Language / Visual special needs []  - 0 Assessment of Community assistance (transportation, D/C planning, etc.) []  - 0 Additional assistance / Altered mentation []  - 0 Support Surface(s) Assessment (bed, cushion, seat, etc.) INTERVENTIONS - Miscellaneous []  - 0 External ear exam []  - 0 Patient Transfer (multiple staff / Nurse, adult / Similar devices) []  - 0 Simple Staple / Suture removal (25 or less) []  - 0 Complex Staple / Suture removal (26 or more) []  - 0 Hypo/Hyperglycemic Management (do not check if billed separately) []  - 0 Ankle / Brachial Index (ABI) - do not check if billed separately Has the patient been seen at the hospital within the last  three years: Yes Total Score: 0 Level Of Care: ____ Electronic Signature(s) Signed: 12/15/2022 4:14:56 PM By: Angelina Pih Entered By:  Angelina Pih on 12/15/2022 13:20:13 -------------------------------------------------------------------------------- Compression Therapy Details Patient Name: Date of Service: [name] 12/15/2022 12:00 PM Medical Record Number: [id number] Patient Account Number: [id number] Date of Birth/Sex: Treating RN: [birth year] (67 y.o. [name] Primary Care Courvoisier Hamblen: [name] Other Clinician: Referring Elease Swarm: Treating Katja Blue/Extender: Matthew Folks in Treatment: 21 Compression Therapy Performed for Wound Assessment: Wound #2 Left,Medial Lower Leg Performed By: Holly Bodily, RN Compression Type: Double Shelby Dubin ([id number]) [id number] Page 3 of 9 Post Procedure Diagnosis Same as Pre-procedure Electronic Signature(s) Signed: 12/15/2022 4:14:56 PM By: Angelina Pih Entered By: Angelina Pih on 12/15/2022 12:43:22 -------------------------------------------------------------------------------- Encounter Discharge Information Details Patient Name: Date of Service: Matthew Holm, [name] 12/15/2022 12:00 PM Medical Record Number: [id number] Patient Account Number: [id number] Date of Birth/Sex: Treating RN: [birth year] (67 y.o. [name] Primary Care Donnie Panik: [name] Other Clinician: Referring Jhalil Silvera: Treating Tonee Silverstein/Extender: Matthew Folks in Treatment: 21 Encounter Discharge Information Items Post Procedure Vitals Discharge Condition: Stable Temperature (F): 98.1 Ambulatory Status: Cane Pulse (bpm): 81 Discharge Destination: Home Respiratory Rate (breaths/min): 18 Transportation: Private Auto Blood Pressure (mmHg): 124/66 Accompanied By: self Schedule Follow-up Appointment: Yes Clinical Summary of Care: Electronic  Signature(s) Signed: 12/15/2022 1:22:48 PM By: Angelina Pih Entered By: Angelina Pih on 12/15/2022 13:22:48 -------------------------------------------------------------------------------- Lower Extremity Assessment Details Patient Name: Date of Service: Matthew Burly [name] 12/15/2022 12:00 PM Medical Record Number: [id number] Patient Account Number: [id number] Date of Birth/Sex: Treating RN: May 05, 1956 (67 y.o. [name] Primary Care Hanaan Gancarz: [name] Other Clinician: Referring Wake Conlee: Treating Keevin Panebianco/Extender: Matthew Folks in Treatment: 21 Edema Assessment Assessed: [Left: No] [name]: No] Edema: [Left: Ye] [Right: s] Calf CARLES, WHITBY ([id number]) [id number] Page 4 of 9 Left: Right: Point of Measurement: 35 cm From Medial Instep 52.7 cm Ankle Left: Right: Point of Measurement: 10 cm From Medial Instep 31 cm Electronic Signature(s) Signed: 12/15/2022 4:14:56 PM By: Angelina Pih Entered By: Angelina Pih on 12/15/2022 12:25:55 -------------------------------------------------------------------------------- Multi Wound Chart Details Patient Name: Date of Service: Matthew Burly [name] 12/15/2022 12:00 PM Medical Record Number: [id number] Patient Account Number: [id number] Date of Birth/Sex: Treating RN: 1955/10/27 (67 y.o. [name] Primary Care Paulena Servais: [name] Other Clinician: Referring Brityn Mastrogiovanni: Treating Modesty Rudy/Extender: Matthew Folks in Treatment: 21 Vital Signs Height(in): 73 Pulse(bpm): 81 Weight(lbs): 358 Blood Pressure(mmHg): 124/66 Body Mass Index(BMI): 47.2 Temperature(F): 98.1 Respiratory Rate(breaths/min): 18 [2:Photos:] [N/A:N/A] Left, Medial Lower Leg N/A N/A Wound Location: Gradually Appeared N/A N/A Wounding Event: Venous Leg Ulcer N/A N/A Primary Etiology: Diabetic Wound/Ulcer of the Lower N/A N/A Secondary Etiology: Extremity Arrhythmia,  Congestive Heart Failure, N/A N/A Comorbid History: Hypertension, Type II Diabetes 06/15/2022 N/A N/A Date Acquired: 21 N/A N/A Weeks of Treatment: Open N/A N/A Wound Status: No N/A N/A Wound Recurrence: 2.3x11.4x0.1 N/A N/A Measurements L x W x D (cm) 20.593 N/A N/A A (cm) : rea 2.059 N/A N/A Volume (cm) : [name] ([id number]) [id number] Page 5 of 9 96.60% N/A N/A % Reduction in Area: 96.60% N/A N/A % Reduction in Volume: Full Thickness Without Exposed N/A N/A Classification: Support Structures Medium N/A N/A Exudate Amount: Serosanguineous N/A N/A Exudate Type: red, brown N/A N/A Exudate Color: Medium (34-66%) N/A N/A Granulation Amount: Pink, Pale N/A N/A Granulation Quality: Medium (34-66%) N/A N/A Necrotic Amount: Fat Layer (Subcutaneous Tissue): Yes N/A N/A Exposed Structures: Fascia: No Tendon: No Muscle: No Joint: No Bone: No None  N/A N/A Epithelialization: Treatment Notes Electronic Signature(s) Signed: 12/15/2022 4:14:56 PM By: Angelina Pih Entered By: Angelina Pih on 12/15/2022 12:26:16 -------------------------------------------------------------------------------- Multi-Disciplinary Care Plan Details Patient Name: Date of Service: Matthew Holm, [name] 12/15/2022 12:00 PM Medical Record Number: [id number] Patient Account Number: [id number] Date of Birth/Sex: Treating RN: 03-02-1956 (67 y.o. [name] Primary Care Shayli Altemose: [name] Other Clinician: Referring Rishit Burkhalter: Treating Jahanna Raether/Extender: Matthew Folks in Treatment: 21 Active Inactive Venous Leg Ulcer Nursing Diagnoses: Actual venous Insuffiency (use after diagnosis is confirmed) Goals: Patient will maintain optimal edema control Date Initiated: 10/15/2022 Target Resolution Date: 12/24/2022 Goal Status: Active Patient/caregiver will verbalize understanding of disease process and disease management Date Initiated:  10/15/2022 Date Inactivated: 11/12/2022 Target Resolution Date: 10/15/2022 Goal Status: Met Verify adequate tissue perfusion prior to therapeutic compression application Date Initiated: 10/15/2022 Date Inactivated: 11/12/2022 Target Resolution Date: 10/15/2022 Goal Status: Met Interventions: Assess peripheral edema status every visit. Compression as ordered Provide education on venous insufficiency Treatment Activities: Therapeutic compression applied : 10/15/2022 Notes: KORIN, SALADINO ([id number]) [id number] Page 6 of 9 Electronic Signature(s) Signed: 12/15/2022 1:20:28 PM By: Angelina Pih Entered By: Angelina Pih on 12/15/2022 13:20:28 -------------------------------------------------------------------------------- Pain Assessment Details Patient Name: Date of Service: Matthew Burly [name] 12/15/2022 12:00 PM Medical Record Number: [id number] Patient Account Number: [id number] Date of Birth/Sex: Treating RN: 21-Mar-1956 (67 y.o. [name] Primary Care Kyera Felan: [name] Other Clinician: Referring Sagar Tengan: Treating Sheneika Walstad/Extender: Matthew Folks in Treatment: 21 Active Problems Location of Pain Severity and Description of Pain Patient Has Paino No Site Locations Rate the pain. Current Pain Level: 0 Pain Management and Medication Current Pain Management: Electronic Signature(s) Signed: 12/15/2022 4:14:56 PM By: Angelina Pih Entered By: Angelina Pih on 12/15/2022 12:16:22 -------------------------------------------------------------------------------- Patient/Caregiver Education Details Patient Name: Date of Service: Matthew Holm, [name] 7/2/2024andnbsp12:00 PM [name] ([id number]) [id number] Page 7 of 9 Medical Record Number: [id number] Patient Account Number: [id number] Date of Birth/Gender: Treating RN: 08-27-1955 (67 y.o. [name] Primary Care Physician: [name] Other Clinician: Referring Physician: Treating Physician/Extender: Matthew Folks in Treatment: 21 Education Assessment Education Provided To: Patient Education Topics Provided Wound Debridement: Handouts: Wound Debridement Methods: Explain/Verbal Responses: State content correctly Wound/Skin Impairment: Handouts: Caring for Your Ulcer Methods: Explain/Verbal Responses: State content correctly Electronic Signature(s) Signed: 12/15/2022 4:14:56 PM By: Angelina Pih Entered By: Angelina Pih on 12/15/2022 13:20:42 -------------------------------------------------------------------------------- Wound Assessment Details Patient Name: Date of Service: Matthew Burly [name] 12/15/2022 12:00 PM Medical Record Number: [id number] Patient Account Number: [id number] Date of Birth/Sex: Treating RN: [birth year] (67 y.o. [name] Primary Care Vennela Jutte: [name] Other Clinician: Referring Jowel Waltner: Treating Lyndon Chenoweth/Extender: Matthew Folks in Treatment: 21 Wound Status Wound Number: 2 Primary Etiology: Venous Leg Ulcer Wound Location: Left, Medial Lower Leg Secondary Diabetic Wound/Ulcer of the Lower Extremity Etiology: Wounding Event: Gradually Appeared Wound Status: Open Date Acquired: 06/15/2022 Comorbid Arrhythmia, Congestive Heart Failure, Hypertension, Type Weeks Of Treatment: 21 History: II Diabetes Clustered Wound: No Photos Wound Measurements HALLET, OBENOUR ([id number]) Length: (cm) 2.3 Width: (cm) 11.4 Depth: (cm) 0.1 Area: (cm) 20.593 Volume: (cm) 2.059 [id number] Page 8 of 9 % Reduction in Area: 96.6% % Reduction in Volume: 96.6% Epithelialization: None Tunneling: No Undermining: No Wound Description Classification: Full Thickness Without Exposed Suppor Exudate Amount: Medium Exudate Type: Serosanguineous Exudate Color: red, brown t Structures Foul Odor After Cleansing:  No Slough/Fibrino Yes Wound Bed Granulation Amount: Medium (34-66%)  Exposed Structure Granulation Quality: Pink, Pale Fascia Exposed: No Necrotic Amount: Medium (34-66%) Fat Layer (Subcutaneous Tissue) Exposed: Yes Tendon Exposed: No Muscle Exposed: No Joint Exposed: No Bone Exposed: No Treatment Notes Wound #2 (Lower Leg) Wound Laterality: Left, Medial Cleanser Soap and Water Discharge Instruction: Gently cleanse wound with antibacterial soap, rinse and pat dry prior to dressing wounds Peri-Wound Care AandD Ointment Discharge Instruction: Apply AandD Ointment to leg Desitin Maximum Strength Ointment 4 (oz) Discharge Instruction: use to anchor top of wrap Topical Primary Dressing AquacelAg Advantage Dressing, 4x5 (in/in) Discharge Instruction: Apply to wound as directed Secondary Dressing ABD Pad 5x9 (in/in) Discharge Instruction: Cover with ABD pad Secured With Compression Wrap Urgo K2 Lite, two layer compression system, large Compression Stockings Add-Ons Electronic Signature(s) Signed: 12/15/2022 4:14:56 PM By: Angelina Pih Entered By: Angelina Pih on 12/15/2022 12:25:31 -------------------------------------------------------------------------------- Vitals Details Patient Name: Date of Service: [name], [name] 12/15/2022 12:00 PM [name] ([id number]) [id number] Page 9 of 9 Medical Record Number: [id number] Patient Account Number: [id number] Date of Birth/Sex: Treating RN: [birth year] (67 y.o. [name] Primary Care Jeziel Hoffmann: [name] Other Clinician: Referring Emmersen Garraway: Treating Lindamarie Maclachlan/Extender: Matthew Folks in Treatment: 21 Vital Signs Time Taken: 12:15 Temperature (F): 98.1 Height (in): 73 Pulse (bpm): 81 Weight (lbs): 358 Respiratory Rate (breaths/min): 18 Body Mass Index (BMI): 47.2 Blood Pressure (mmHg): 124/66 Reference Range: 80 - 120 mg / dl Electronic  Signature(s) Signed: 12/15/2022 4:14:56 PM By: Angelina Pih Entered By: Angelina Pih on 12/15/2022 12:16:15

## 2022-12-15 NOTE — Progress Notes (Addendum)
HAKEEM, URSIN ([id number]) [id number] Page 1 of 9 Visit Report for 12/15/2022 Chief Complaint Document Details Patient Name: Date of Service: [name] 12/15/2022 12:00 PM Medical Record Number: [id number] Patient Account Number: [id number] Date of Birth/Sex: Treating RN: [birth year] (67 y.o. [name] Primary Care Provider: [name] Other Clinician: Referring Provider: Treating Provider/Extender: [name] in Treatment: 21 Information Obtained from: Patient Chief Complaint Bilateral LE ulcers and lymphedema Electronic Signature(s) Signed: 12/15/2022 12:36:46 PM By: Allen Derry PA-C Entered By: Allen Derry on 12/15/2022 12:36:46 -------------------------------------------------------------------------------- Debridement Details Patient Name: Date of Service: Matthew Burly [name] 12/15/2022 12:00 PM Medical Record Number: [id number] Patient Account Number: [id number] Date of Birth/Sex: Treating RN: 1956-02-17 (67 y.o. [name] Primary Care Provider: [name] Other Clinician: Referring Provider: Treating Provider/Extender: [name] in Treatment: 21 Debridement Performed for Assessment: Wound #2 Left,Medial Lower Leg Performed By: Physician Allen Derry, PA-C Debridement Type: Debridement Severity of Tissue Pre Debridement: Fat layer exposed Level of Consciousness (Pre-procedure): Awake and Alert Pre-procedure Verification/Time Out Yes - 12:41 Taken: Pain Control: Lidocaine 4% T opical Solution Percent of Wound Bed Debrided: 20% T Area Debrided (cm): otal 4.12 Tissue and other material debrided: Viable, Non-Viable, Slough, Subcutaneous, Slough Level: Skin/Subcutaneous Tissue Debridement Description: Excisional Instrument: Curette Bleeding: Moderate Hemostasis Achieved: Pressure Response to Treatment: Procedure was tolerated well Level of Consciousness (Post- Awake and  Alert procedure): [name], [name] ([id number]) [id number] Page 2 of 9 Post Debridement Measurements of Total Wound Length: (cm) 2.3 Width: (cm) 11.4 Depth: (cm) 0.1 Volume: (cm) 2.059 Character of Wound/Ulcer Post Debridement: Stable Severity of Tissue Post Debridement: Fat layer exposed Post Procedure Diagnosis Same as Pre-procedure Electronic Signature(s) Signed: 12/15/2022 4:14:56 PM By: Angelina Pih Signed: 12/15/2022 5:13:07 PM By: Allen Derry PA-C Entered By: Angelina Pih on 12/15/2022 12:43:04 -------------------------------------------------------------------------------- HPI Details Patient Name: Date of Service: [name], Matthew [name] 12/15/2022 12:00 PM Medical Record Number: [id number] Patient Account Number: [id number] Date of Birth/Sex: Treating RN: April 09, 1956 (67 y.o. [name] Primary Care Provider: [name] Other Clinician: Referring Provider: Treating Provider/Extender: [name] in Treatment: 21 History of Present Illness HPI Description: 07-16-2022 upon evaluation today patient appears to be doing poorly currently in regard to his his legs. The left leg is actually worse than the right. This is a patient that is previously been seen in the [place] office and at that point was doing really quite well with compression wrapping and often alginate are either Hydrofera Blue dressings. Fortunately he has gone since November 2020 until just current with out having any issue or need for wound care services which is great news. Unfortunately he is here today because he is having some issues at this point. Patient does have a history of several medical conditions which include diabetes mellitus type 2, lymphedema, hypertension, congestive heart failure, and cardiac arrhythmia though is not sure that this is actually atrial fibrillation based on what he has been told. He does have lymphedema pumps but tells me  he has not used them since the summer when he had to move to a remodel of his building and subsequently never unpacked them. 07-23-2022 upon evaluation today patient appears to be doing well with regard to his legs. He is going require some sharp debridement today but overall seems to be making some progress. I am pleased in that regard he has much less drainage that he had did last week I think  the compression wraps are definitely helping. 07-30-2022 upon evaluation today patient appears to be doing poorly in regard to his legs he actually has blue-green drainage which I think is consistent with Pseudomonas. I believe that we need to address this as soon as possible and I discussed that with the patient today as well. Fortunately I do not see any signs of active infection locally nor systemically at this time which is great news. No fevers, chills, nausea, vomiting, or diarrhea. 08-13-2022 upon evaluation today patient appears to be doing well currently in regard to his leg ulcers which are actually looking much better. Fortunately there does not appear to be any signs of active infection at this time which is great news. No fevers, chills, nausea, vomiting, or diarrhea. 3/7; patient with predominantly a left medial lower extremity wound as well as several smaller areas and a small area on the right lateral. His Jodie Echevaria came in today. We are using silver alginate as the primary dressing 09-03-2022 upon evaluation today patient appears to be doing well currently in regard to his wounds. He has been tolerating the dressing changes without complication. Fortunately there does not appear to be any signs of infection there is needed however for sharp debridement. 09-10-2022 upon evaluation today patient appears to be doing well currently in regard to his wound. Has been tolerating the dressing changes without complication. Fortunately there does not appear to be any signs of infection looking systemically  which is great news and overall I am extremely pleased with where we stand today. I do not see any signs of infection. 09-17-2022 upon evaluation today patient actually is making signs of improvement the good news is he is making good improvement. With that being said he still is having quite a time keeping the swelling down. We have been using Tubigrip I think that still probably our best option but nonetheless I think that we are making progress with regard to his wounds. 09-24-2022 upon evaluation today patient appears to be doing well currently in regard to his wounds is continuing to make improvements at this point which is great news and overall I am extremely pleased with where we stand today. Fortunately I do not see any evidence of active infection at this time which is great news and in general I think he is doing quite well with the [facility] and silver alginate. [name], [name] ([id number]) [id number] Page 3 of 9 10-01-2022 upon evaluation today patient appears to be doing pretty well currently in regard to his wounds. He has been tolerating the dressing changes without complication. Fortunately I do not see any evidence of active infection locally nor systemically which is great news and I do believe that 10-08-2022 upon evaluation today patient's wounds actually are showing some signs of improvement which is great news. Still this is very slow to heal I really feel like he would do better if we can get him in some stronger compression wrapping he does have the juxta lite compression wraps therefore we can actually see about doing that over top of his Tubigrip to see if that can be beneficial. He does not love the hide he had as they are not the most comfortable thing for him but nonetheless I think it would be beneficial he is in agreement with going forward with this. 10-15-2022 upon evaluation today patient appears to be doing well currently in regard to his wounds  that do not appear to be infected which is good news. With that being  said unfortunately he still continues to have significant amount of swelling we really need to do something to try to get the swelling under control. I do not see any signs of infection locally nor systemically but at the same time the amount of edema is tremendous. The juxta lite helps but again he is only taking the fluid pills once or twice a week due to the amount that makes him go to the restroom. Therefore he does not take this daily which is really what he needs on a more regular basis. We need to try to get some compression on and is a little bit stronger. 10-22-2022 upon evaluation today patient actually appears to be making some really good progress in regard to his wound. The compression wrap was put on last time actually doing a great job he looks much improved and in general I feel like that we are making great progress. I do not see any evidence of active infection locally nor systemically which is great news. 10-29-2022 upon evaluation today patient appears to be doing decently well in regard to his legs currently. I feel like that his swelling is much better I feel like that he is also doing much better in regard to the wounds in general. I do not see any signs of active infection which is great news and in general I think that he is making excellent progress towards complete resolution. The patient does seem to have 1 issue that is with his wraps actually slipping down working I definitely need to work on that aspect. 11-05-2022 upon evaluation today patient appears to be doing excellent in regard to his wound. He has been tolerating the dressing changes without complication. He actually seems to be making excellent progress I am extremely pleased with where things stand currently. I do believe that we are really making good progress and I think that we are on the right track towards complete closure I think the  compression wrap has been a dramatic shift in a good way for him as far as getting the wounds healed a lot of these areas that we will continue to leave Completely sealed up. 11-12-2022 upon evaluation today patient appears to be doing well currently in regard to his leg ulcer. The compression wrapping seems to be doing an excellent job. Fortunately I do not see any evidence of active infection locally nor systemically which is great news and in general I do believe that we are making good headway here towards complete closure. 11-19-2022 upon evaluation today patient appears to be doing well currently in regard to his wounds. He is actually showing signs of excellent improvement and very pleased with where we stand I think that he is making great progress. I do not see any evidence of infection at this time which is great news. 11-26-2022 upon evaluation today patient appears to be doing well currently in regard to his wound. He is actually been tolerating the dressing changes without complication. Fortunately I do not see any evidence of active infection locally nor systemically which is great news I think he is making excellent progress here towards closure. 12-03-2022 upon evaluation today patient appears to be doing well currently in regard to his wound. He in fact at both locations is doing great and seems to be making excellent progress and very pleased in that regard. I do not see any signs of active infection at this time. 12-10-2022 upon evaluation patient's wound is actually significantly smaller with one of the  satellite lesions closed and there is just 1 small area remaining and this is actually doing quite well. I am very pleased with her progress. 12-15-2022 upon evaluation today patient appears to be doing actually pretty well in regard to his wound on the left leg. He does have a little area on the backside that open that was previously close last week due to the fact that his wrap got wet  over the weekend and he had to make do with stuff they can find at the drugstore. They made this work out however and the good news is he actually seems to be doing significantly better and the wound looks fine except for the fact that he has a couple of areas that just reopen on the backside very tiny. Nonetheless I do believe that we can go ahead and see what we do about trying to get this moving in the right direction yet again. Electronic Signature(s) Signed: 12/15/2022 12:47:01 PM By: Allen Derry PA-C Entered By: Allen Derry on 12/15/2022 12:47:01 -------------------------------------------------------------------------------- Physical Exam Details Patient Name: Date of Service: Matthew Burly [name] 12/15/2022 12:00 PM Medical Record Number: [id number] Patient Account Number: [id number] Date of Birth/Sex: Treating RN: [birth year] (67 y.o. [name] Primary Care Provider: [name] Other Clinician: Referring Provider: Treating Provider/Extender: [name] in Treatment: 21 Constitutional Obese and well-hydrated in no acute distress. Respiratory normal breathing without difficulty. [name], [name] ([id number]) [id number] Page 4 of 9 Psychiatric this patient is able to make decisions and demonstrates good insight into disease process. Alert and Oriented x 3. pleasant and cooperative. Notes Upon inspection patient's wound bed actually showed signs of good granulation and epithelization at this point. Fortunately I do not see any evidence of active infection locally nor systemically which is great news and in general I do believe that we are making progress towards healing which is excellent as well. He had a little bit of a setback with his wrap getting wet but nothing to bad. Electronic Signature(s) Signed: 12/15/2022 12:47:29 PM By: Allen Derry PA-C Entered By: Allen Derry on 12/15/2022  12:47:29 -------------------------------------------------------------------------------- Physician Orders Details Patient Name: Date of Service: [name], Matthew [name] 12/15/2022 12:00 PM Medical Record Number: [id number] Patient Account Number: [id number] Date of Birth/Sex: Treating RN: 11-Feb-1956 (67 y.o. [name] Primary Care Provider: [name] Other Clinician: Referring Provider: Treating Provider/Extender: [name] in Treatment: 21 Verbal / Phone Orders: No Diagnosis Coding ICD-10 Coding Code Description E11.622 Type 2 diabetes mellitus with other skin ulcer I89.0 Lymphedema, not elsewhere classified L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 L97.812 Non-pressure chronic ulcer of other part of right lower leg with fat layer exposed I10 Essential (primary) hypertension I50.42 Chronic combined systolic (congestive) and diastolic (congestive) heart failure I49.9 Cardiac arrhythmia, unspecified Follow-up Appointments Return Appointment in 1 week. Nurse Visit as needed Bathing/ Shower/ Hygiene May shower with wound dressing protected with water repellent cover or cast protector. Anesthetic (Use 'Patient Medications' Section for Anesthetic Order Entry) Lidocaine applied to wound bed Edema Control - Lymphedema / Segmental Compressive Device / Other Elevate, Exercise Daily and A void Standing for Long Periods of Time. Elevate legs to the level of the heart and pump ankles as often as possible Elevate leg(s) parallel to the floor when sitting. Compression Pump: Use compression pump on left lower extremity for 60 minutes, twice daily. Compression Pump: Use compression pump on right lower extremity  for 60 minutes, twice daily. DO YOUR BEST to sleep in the bed at night. DO NOT sleep in your recliner. Long hours of sitting in a recliner leads to swelling of the legs and/or potential wounds on your  backside. Wound Treatment Wound #2 - Lower Leg Wound Laterality: Left, Medial Cleanser: Soap and Water 2 x Per Week/30 Days Discharge Instructions: Gently cleanse wound with antibacterial soap, rinse and pat dry prior to dressing wounds Peri-Wound Care: AandD Ointment 2 x Per Week/30 Days Discharge Instructions: Apply AandD Ointment to leg [name], [name] ([id number]) [id number] Page 5 of 9 Peri-Wound Care: Desitin Maximum Strength Ointment 4 (oz) 2 x Per Week/30 Days Discharge Instructions: use to anchor top of wrap Prim Dressing: AquacelAg Advantage Dressing, 4x5 (in/in) 2 x Per Week/30 Days ary Discharge Instructions: Apply to wound as directed Secondary Dressing: ABD Pad 5x9 (in/in) 2 x Per Week/30 Days Discharge Instructions: Cover with ABD pad Compression Wrap: Urgo K2 Lite, two layer compression system, large 2 x Per Week/30 Days Electronic Signature(s) Signed: 12/15/2022 4:14:56 PM By: Angelina Pih Signed: 12/15/2022 5:13:07 PM By: Allen Derry PA-C Entered By: Angelina Pih on 12/15/2022 12:55:01 -------------------------------------------------------------------------------- Problem List Details Patient Name: Date of Service: Matthew Burly [name] 12/15/2022 12:00 PM Medical Record Number: [id number] Patient Account Number: [id number] Date of Birth/Sex: Treating RN: [birth year] (67 y.o. [name] Primary Care Provider: [name] Other Clinician: Referring Provider: Treating Provider/Extender: [name] in Treatment: 21 Active Problems ICD-10 Encounter Code Description Active Date MDM Diagnosis E11.622 Type 2 diabetes mellitus with other skin ulcer 07/16/2022 No Yes I89.0 Lymphedema, not elsewhere classified 07/16/2022 No Yes L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2/06/2022 No Yes L97.812 Non-pressure chronic ulcer of other part of right lower leg with fat layer  07/16/2022 No Yes exposed I10 Essential (primary) hypertension 07/16/2022 No Yes I50.42 Chronic combined systolic (congestive) and diastolic (congestive) heart failure 07/16/2022 No Yes I49.9 Cardiac arrhythmia, unspecified 07/16/2022 No Yes [name], [name] ([id number]) [id number] Page 6 of 9 Inactive Problems Resolved Problems Electronic Signature(s) Signed: 12/15/2022 12:36:41 PM By: Allen Derry PA-C Entered By: Allen Derry on 12/15/2022 12:36:41 -------------------------------------------------------------------------------- Progress Note Details Patient Name: Date of Service: [name], Matthew [name] 12/15/2022 12:00 PM Medical Record Number: [id number] Patient Account Number: [id number] Date of Birth/Sex: Treating RN: 07/27/1955 (67 y.o. [name] Primary Care Provider: [name] Other Clinician: Referring Provider: Treating Provider/Extender: [name] in Treatment: 21 Subjective Chief Complaint Information obtained from Patient Bilateral LE ulcers and lymphedema History of Present Illness (HPI) 07-16-2022 upon evaluation today patient appears to be doing poorly currently in regard to his his legs. The left leg is actually worse than the right. This is a patient that is previously been seen in the [place] office and at that point was doing really quite well with compression wrapping and often alginate are either Hydrofera Blue dressings. Fortunately he has gone since November 2020 until just current with out having any issue or need for wound care services which is great news. Unfortunately he is here today because he is having some issues at this point. Patient does have a history of several medical conditions which include diabetes mellitus type 2, lymphedema, hypertension, congestive heart failure, and cardiac arrhythmia though is not sure that this is actually atrial fibrillation based on what he has been told. He does have  lymphedema pumps but tells me he has not used  them since the summer when he had to move to a remodel of his building and subsequently never unpacked them. 07-23-2022 upon evaluation today patient appears to be doing well with regard to his legs. He is going require some sharp debridement today but overall seems to be making some progress. I am pleased in that regard he has much less drainage that he had did last week I think the compression wraps are definitely helping. 07-30-2022 upon evaluation today patient appears to be doing poorly in regard to his legs he actually has blue-green drainage which I think is consistent with Pseudomonas. I believe that we need to address this as soon as possible and I discussed that with the patient today as well. Fortunately I do not see any signs of active infection locally nor systemically at this time which is great news. No fevers, chills, nausea, vomiting, or diarrhea. 08-13-2022 upon evaluation today patient appears to be doing well currently in regard to his leg ulcers which are actually looking much better. Fortunately there does not appear to be any signs of active infection at this time which is great news. No fevers, chills, nausea, vomiting, or diarrhea. 3/7; patient with predominantly a left medial lower extremity wound as well as several smaller areas and a small area on the right lateral. His Jodie Echevaria came in today. We are using silver alginate as the primary dressing 09-03-2022 upon evaluation today patient appears to be doing well currently in regard to his wounds. He has been tolerating the dressing changes without complication. Fortunately there does not appear to be any signs of infection there is needed however for sharp debridement. 09-10-2022 upon evaluation today patient appears to be doing well currently in regard to his wound. Has been tolerating the dressing changes without complication. Fortunately there does not appear to be any signs of  infection looking systemically which is great news and overall I am extremely pleased with where we stand today. I do not see any signs of infection. 09-17-2022 upon evaluation today patient actually is making signs of improvement the good news is he is making good improvement. With that being said he still is having quite a time keeping the swelling down. We have been using Tubigrip I think that still probably our best option but nonetheless I think that we are making progress with regard to his wounds. 09-24-2022 upon evaluation today patient appears to be doing well currently in regard to his wounds is continuing to make improvements at this point which is great news and overall I am extremely pleased with where we stand today. Fortunately I do not see any evidence of active infection at this time which is great news and in general I think he is doing quite well with the [facility] and silver alginate. 10-01-2022 upon evaluation today patient appears to be doing pretty well currently in regard to his wounds. He has been tolerating the dressing changes without complication. Fortunately I do not see any evidence of active infection locally nor systemically which is great news and I do believe that 10-08-2022 upon evaluation today patient's wounds actually are showing some signs of improvement which is great news. Still this is very slow to heal I really feel like he would do better if we can get him in some stronger compression wrapping he does have the juxta lite compression wraps therefore we can actually see about doing that over top of his Tubigrip to see if that can be beneficial. He does not love the  hide he had as they are not the most comfortable thing for [name], [name] ([id number]) [id number] Page 7 of 9 him but nonetheless I think it would be beneficial he is in agreement with going forward with this. 10-15-2022 upon evaluation today patient appears to be doing well  currently in regard to his wounds that do not appear to be infected which is good news. With that being said unfortunately he still continues to have significant amount of swelling we really need to do something to try to get the swelling under control. I do not see any signs of infection locally nor systemically but at the same time the amount of edema is tremendous. The juxta lite helps but again he is only taking the fluid pills once or twice a week due to the amount that makes him go to the restroom. Therefore he does not take this daily which is really what he needs on a more regular basis. We need to try to get some compression on and is a little bit stronger. 10-22-2022 upon evaluation today patient actually appears to be making some really good progress in regard to his wound. The compression wrap was put on last time actually doing a great job he looks much improved and in general I feel like that we are making great progress. I do not see any evidence of active infection locally nor systemically which is great news. 10-29-2022 upon evaluation today patient appears to be doing decently well in regard to his legs currently. I feel like that his swelling is much better I feel like that he is also doing much better in regard to the wounds in general. I do not see any signs of active infection which is great news and in general I think that he is making excellent progress towards complete resolution. The patient does seem to have 1 issue that is with his wraps actually slipping down working I definitely need to work on that aspect. 11-05-2022 upon evaluation today patient appears to be doing excellent in regard to his wound. He has been tolerating the dressing changes without complication. He actually seems to be making excellent progress I am extremely pleased with where things stand currently. I do believe that we are really making good progress and I think that we are on the right track towards  complete closure I think the compression wrap has been a dramatic shift in a good way for him as far as getting the wounds healed a lot of these areas that we will continue to leave Completely sealed up. 11-12-2022 upon evaluation today patient appears to be doing well currently in regard to his leg ulcer. The compression wrapping seems to be doing an excellent job. Fortunately I do not see any evidence of active infection locally nor systemically which is great news and in general I do believe that we are making good headway here towards complete closure. 11-19-2022 upon evaluation today patient appears to be doing well currently in regard to his wounds. He is actually showing signs of excellent improvement and very pleased with where we stand I think that he is making great progress. I do not see any evidence of infection at this time which is great news. 11-26-2022 upon evaluation today patient appears to be doing well currently in regard to his wound. He is actually been tolerating the dressing changes without complication. Fortunately I do not see any evidence of active infection locally nor systemically which is great news I think he  is making excellent progress here towards closure. 12-03-2022 upon evaluation today patient appears to be doing well currently in regard to his wound. He in fact at both locations is doing great and seems to be making excellent progress and very pleased in that regard. I do not see any signs of active infection at this time. 12-10-2022 upon evaluation patient's wound is actually significantly smaller with one of the satellite lesions closed and there is just 1 small area remaining and this is actually doing quite well. I am very pleased with her progress. 12-15-2022 upon evaluation today patient appears to be doing actually pretty well in regard to his wound on the left leg. He does have a little area on the backside that open that was previously close last week due to the  fact that his wrap got wet over the weekend and he had to make do with stuff they can find at the drugstore. They made this work out however and the good news is he actually seems to be doing significantly better and the wound looks fine except for the fact that he has a couple of areas that just reopen on the backside very tiny. Nonetheless I do believe that we can go ahead and see what we do about trying to get this moving in the right direction yet again. Objective Constitutional Obese and well-hydrated in no acute distress. Vitals Time Taken: 12:15 PM, Height: 73 in, Weight: 358 lbs, BMI: 47.2, Temperature: 98.1 F, Pulse: 81 bpm, Respiratory Rate: 18 breaths/min, Blood Pressure: 124/66 mmHg. Respiratory normal breathing without difficulty. Psychiatric this patient is able to make decisions and demonstrates good insight into disease process. Alert and Oriented x 3. pleasant and cooperative. General Notes: Upon inspection patient's wound bed actually showed signs of good granulation and epithelization at this point. Fortunately I do not see any evidence of active infection locally nor systemically which is great news and in general I do believe that we are making progress towards healing which is excellent as well. He had a little bit of a setback with his wrap getting wet but nothing to bad. Integumentary (Hair, Skin) Wound #2 status is Open. Original cause of wound was Gradually Appeared. The date acquired was: 06/15/2022. The wound has been in treatment 21 weeks. The wound is located on the Left,Medial Lower Leg. The wound measures 2.3cm length x 11.4cm width x 0.1cm depth; 20.593cm^2 area and 2.059cm^3 volume. There is Fat Layer (Subcutaneous Tissue) exposed. There is no tunneling or undermining noted. There is a medium amount of serosanguineous drainage noted. There is medium (34-66%) pink, pale granulation within the wound bed. There is a medium (34-66%) amount of necrotic tissue within the  wound bed. 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 There was a Excisional Skin/Subcutaneous Tissue Debridement with a total area of 4.12 sq cm performed by Allen Derry, PA-C. With the following instrument(s): Curette to remove Viable and Non-Viable tissue/material. Material removed includes Subcutaneous Tissue and Slough and after  achieving pain control using Lidocaine 4% T opical Solution. No specimens were taken. A time out was conducted at 12:41, prior to the start of the procedure. A Moderate amount of bleeding was controlled with Pressure. The procedure was tolerated well. Post Debridement Measurements: 2.3cm length x 11.4cm width x 0.1cm depth; 2.059cm^3 volume. Character of Wound/Ulcer Post Debridement is stable. Severity of Tissue Post Debridement is: Fat layer exposed. 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 There was a Double Layer Compression Therapy Procedure by Angelina Pih, RN. Post procedure Diagnosis Wound #2: Same as Pre-Procedure Plan 1. I would recommend at this point that we have the patient continue to monitor for any signs of infection or worsening. Based on what I am seeing I do believe that we are making good headway  towards closure I think this is just a minor setback with his wrap getting wet he did replace it over the weekend on his own and that seems to be something that did fairly well for him which is good news. 2. I would recommend as well that the patient should continue to monitor for any signs of infection or worsening. Based on what I am seeing I do feel like we are making good headway towards closure. We will see patient back for reevaluation in 1 week here in the clinic. If anything worsens or changes patient will contact our office for additional recommendations. Electronic Signature(s) Signed: 12/15/2022 12:48:07 PM By: Allen Derry PA-C Entered By: Allen Derry on 12/15/2022 12:48:07 -------------------------------------------------------------------------------- SuperBill Details Patient Name: Date of Service: [name], Matthew [name] 12/15/2022 Medical Record Number: [id number] Patient Account Number: [id number] Date of Birth/Sex: Treating RN: [id number] (67 y.o. [name] Primary Care Provider: [name] Other Clinician: Referring Provider: Treating Provider/Extender: [name] in Treatment: 21 Diagnosis Coding ICD-10 Codes Code Description E11.622 Type 2 diabetes mellitus with other skin ulcer I89.0 Lymphedema, not elsewhere classified L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 [name], [name] ([id number]) [id number] Page 9 of 9 L97.812 Non-pressure chronic ulcer of other part of right lower leg with fat layer exposed I10 Essential (primary) hypertension I50.42 Chronic combined systolic (congestive) and diastolic (congestive) heart failure I49.9 Cardiac arrhythmia, unspecified Facility Procedures : CPT4 Code: [id number] Description: 11042 - DEB SUBQ TISSUE 20 SQ CM/< ICD-10 Diagnosis Description L97.822 Non-pressure chronic ulcer of other part of left lower leg with fat layer  expo Modifier: sed Quantity: 1 Physician Procedures : CPT4 Code Description Modifier [id number] 11042 - WC PHYS SUBQ TISS 20 SQ CM ICD-10 Diagnosis Description L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 Quantity: 1 Electronic Signature(s) Signed: 12/15/2022 1:20:20 PM By: Angelina Pih Signed: 12/15/2022 5:13:07 PM By: Allen Derry PA-C Previous Signature: 12/15/2022 12:49:55 PM Version By: Allen Derry PA-C Entered By: Angelina Pih on 12/15/2022 13:20:20

## 2022-12-22 ENCOUNTER — Encounter: Payer: PPO | Admitting: Physician Assistant

## 2022-12-22 DIAGNOSIS — E11622 Type 2 diabetes mellitus with other skin ulcer: Secondary | ICD-10-CM | POA: Diagnosis not present

## 2022-12-22 DIAGNOSIS — I872 Venous insufficiency (chronic) (peripheral): Secondary | ICD-10-CM | POA: Diagnosis not present

## 2022-12-22 DIAGNOSIS — L97822 Non-pressure chronic ulcer of other part of left lower leg with fat layer exposed: Secondary | ICD-10-CM | POA: Diagnosis not present

## 2022-12-22 NOTE — Progress Notes (Signed)
JOVONI, WOOTTEN ([id number]) [id number] Page 1 of 8 Visit Report for 12/22/2022 Chief Complaint Document Details Patient Name: Date of Service: [name] 12/22/2022 3:00 PM Medical Record Number: [id number] Patient Account Number: [id number] Date of Birth/Sex: Treating RN: [birth year] (67 y.o. [name] Primary Care Provider: [name] Other Clinician: Referring Provider: Treating Provider/Extender: Matthew Folks in Treatment: 22 Information Obtained from: Patient Chief Complaint Bilateral LE ulcers and lymphedema Electronic Signature(s) Signed: 12/22/2022 2:50:51 PM By: Allen Derry PA-C Entered By: Allen Derry on 12/22/2022 14:50:51 -------------------------------------------------------------------------------- HPI Details Patient Name: Date of Service: [name], Matthew [name] 12/22/2022 3:00 PM Medical Record Number: [id number] Patient Account Number: [id number] Date of Birth/Sex: Treating RN: Aug 29, 1955 (67 y.o. [name] Primary Care Provider: [name] Other Clinician: Referring Provider: Treating Provider/Extender: Matthew Folks in Treatment: 22 History of Present Illness HPI Description: 07-16-2022 upon evaluation today patient appears to be doing poorly currently in regard to his his legs. The left leg is actually worse than the right. This is a patient that is previously been seen in the [place] office and at that point was doing really quite well with compression wrapping and often alginate are either Hydrofera Blue dressings. Fortunately he has gone since November 2020 until just current with out having any issue or need for wound care services which is great news. Unfortunately he is here today because he is having some issues at this point. Patient does have a history of several medical conditions which include diabetes mellitus type 2, lymphedema, hypertension, congestive heart  failure, and cardiac arrhythmia though is not sure that this is actually atrial fibrillation based on what he has been told. He does have lymphedema pumps but tells me he has not used them since the summer when he had to move to a remodel of his building and subsequently never unpacked them. 07-23-2022 upon evaluation today patient appears to be doing well with regard to his legs. He is going require some sharp debridement today but overall seems to be making some progress. I am pleased in that regard he has much less drainage that he had did last week I think the compression wraps are definitely helping. 07-30-2022 upon evaluation today patient appears to be doing poorly in regard to his legs he actually has blue-green drainage which I think is consistent with Pseudomonas. I believe that we need to address this as soon as possible and I discussed that with the patient today as well. Fortunately I do not see any signs of active infection locally nor systemically at this time which is great news. No fevers, chills, nausea, vomiting, or diarrhea. [name], [name] ([id number]) [id number] Page 2 of 8 08-13-2022 upon evaluation today patient appears to be doing well currently in regard to his leg ulcers which are actually looking much better. Fortunately there does not appear to be any signs of active infection at this time which is great news. No fevers, chills, nausea, vomiting, or diarrhea. 3/7; patient with predominantly a left medial lower extremity wound as well as several smaller areas and a small area on the right lateral. His Jodie Echevaria came in today. We are using silver alginate as the primary dressing 09-03-2022 upon evaluation today patient appears to be doing well currently in regard to his wounds. He has been tolerating the dressing changes without complication. Fortunately there does not appear to be any signs of infection there is needed however for sharp  debridement. 09-10-2022  upon evaluation today patient appears to be doing well currently in regard to his wound. Has been tolerating the dressing changes without complication. Fortunately there does not appear to be any signs of infection looking systemically which is great news and overall I am extremely pleased with where we stand today. I do not see any signs of infection. 09-17-2022 upon evaluation today patient actually is making signs of improvement the good news is he is making good improvement. With that being said he still is having quite a time keeping the swelling down. We have been using Tubigrip I think that still probably our best option but nonetheless I think that we are making progress with regard to his wounds. 09-24-2022 upon evaluation today patient appears to be doing well currently in regard to his wounds is continuing to make improvements at this point which is great news and overall I am extremely pleased with where we stand today. Fortunately I do not see any evidence of active infection at this time which is great news and in general I think he is doing quite well with the [facility] and silver alginate. 10-01-2022 upon evaluation today patient appears to be doing pretty well currently in regard to his wounds. He has been tolerating the dressing changes without complication. Fortunately I do not see any evidence of active infection locally nor systemically which is great news and I do believe that 10-08-2022 upon evaluation today patient's wounds actually are showing some signs of improvement which is great news. Still this is very slow to heal I really feel like he would do better if we can get him in some stronger compression wrapping he does have the juxta lite compression wraps therefore we can actually see about doing that over top of his Tubigrip to see if that can be beneficial. He does not love the hide he had as they are not the most comfortable thing for him but nonetheless  I think it would be beneficial he is in agreement with going forward with this. 10-15-2022 upon evaluation today patient appears to be doing well currently in regard to his wounds that do not appear to be infected which is good news. With that being said unfortunately he still continues to have significant amount of swelling we really need to do something to try to get the swelling under control. I do not see any signs of infection locally nor systemically but at the same time the amount of edema is tremendous. The juxta lite helps but again he is only taking the fluid pills once or twice a week due to the amount that makes him go to the restroom. Therefore he does not take this daily which is really what he needs on a more regular basis. We need to try to get some compression on and is a little bit stronger. 10-22-2022 upon evaluation today patient actually appears to be making some really good progress in regard to his wound. The compression wrap was put on last time actually doing a great job he looks much improved and in general I feel like that we are making great progress. I do not see any evidence of active infection locally nor systemically which is great news. 10-29-2022 upon evaluation today patient appears to be doing decently well in regard to his legs currently. I feel like that his swelling is much better I feel like that he is also doing much better in regard to the wounds in general. I do not see any signs of  active infection which is great news and in general I think that he is making excellent progress towards complete resolution. The patient does seem to have 1 issue that is with his wraps actually slipping down working I definitely need to work on that aspect. 11-05-2022 upon evaluation today patient appears to be doing excellent in regard to his wound. He has been tolerating the dressing changes without complication. He actually seems to be making excellent progress I am extremely  pleased with where things stand currently. I do believe that we are really making good progress and I think that we are on the right track towards complete closure I think the compression wrap has been a dramatic shift in a good way for him as far as getting the wounds healed a lot of these areas that we will continue to leave Completely sealed up. 11-12-2022 upon evaluation today patient appears to be doing well currently in regard to his leg ulcer. The compression wrapping seems to be doing an excellent job. Fortunately I do not see any evidence of active infection locally nor systemically which is great news and in general I do believe that we are making good headway here towards complete closure. 11-19-2022 upon evaluation today patient appears to be doing well currently in regard to his wounds. He is actually showing signs of excellent improvement and very pleased with where we stand I think that he is making great progress. I do not see any evidence of infection at this time which is great news. 11-26-2022 upon evaluation today patient appears to be doing well currently in regard to his wound. He is actually been tolerating the dressing changes without complication. Fortunately I do not see any evidence of active infection locally nor systemically which is great news I think he is making excellent progress here towards closure. 12-03-2022 upon evaluation today patient appears to be doing well currently in regard to his wound. He in fact at both locations is doing great and seems to be making excellent progress and very pleased in that regard. I do not see any signs of active infection at this time. 12-10-2022 upon evaluation patient's wound is actually significantly smaller with one of the satellite lesions closed and there is just 1 small area remaining and this is actually doing quite well. I am very pleased with her progress. 12-15-2022 upon evaluation today patient appears to be doing actually  pretty well in regard to his wound on the left leg. He does have a little area on the backside that open that was previously close last week due to the fact that his wrap got wet over the weekend and he had to make do with stuff they can find at the drugstore. They made this work out however and the good news is he actually seems to be doing significantly better and the wound looks fine except for the fact that he has a couple of areas that just reopen on the backside very tiny. Nonetheless I do believe that we can go ahead and see what we do about trying to get this moving in the right direction yet again. 12-22-2022 upon evaluation today patient appears to be doing poorly in regard to his wrap slid down and his leg is extremely swollen. It is also painful secondary to it being so swollen. Fortunately I do not see any evidence of infection right now but at the same time I do believe that if we do not get the swelling under control is not  really good to alleviate his discomfort. We definitely can need to change his wrap out however in short order. Electronic Signature(s) Signed: 12/22/2022 6:16:12 PM By: Allen Derry PA-C Entered By: Allen Derry on 12/22/2022 18:16:12 [name] ([id number]) [id number] Page 3 of 8 -------------------------------------------------------------------------------- Physical Exam Details Patient Name: Date of Service: [name] 12/22/2022 3:00 PM Medical Record Number: [id number] Patient Account Number: [id number] Date of Birth/Sex: Treating RN: 27-Apr-1956 (67 y.o. [name] Primary Care Provider: [name] Other Clinician: Referring Provider: Treating Provider/Extender: Matthew Folks in Treatment: 22 Constitutional Obese and well-hydrated in no acute distress. Respiratory normal breathing without difficulty. Psychiatric this patient is able to make decisions and demonstrates good insight into  disease process. Alert and Oriented x 3. pleasant and cooperative. Notes Upon inspection patient's wound bed actually showed signs of good granulation epithelization at this point. Fortunately I do not see any evidence of infection locally nor systemically which is great news and in general I think that we are headed in the right direction here. The only issue is that his wrap slipped and subsequently this has caused the wound to worsen as well as the leg to swell dramatically. Will need to bring in for nurse visits in order to change this wrap out. Electronic Signature(s) Signed: 12/22/2022 6:16:43 PM By: Allen Derry PA-C Entered By: Allen Derry on 12/22/2022 18:16:42 -------------------------------------------------------------------------------- Physician Orders Details Patient Name: Date of Service: [name], Matthew [name] 12/22/2022 3:00 PM Medical Record Number: [id number] Patient Account Number: [id number] Date of Birth/Sex: Treating RN: July 22, 1955 (67 y.o. [name] Primary Care Provider: [name] Other Clinician: Referring Provider: Treating Provider/Extender: Matthew Folks in Treatment: 22 Verbal / Phone Orders: No Diagnosis Coding ICD-10 Coding Code Description E11.622 Type 2 diabetes mellitus with other skin ulcer I89.0 Lymphedema, not elsewhere classified L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 L97.812 Non-pressure chronic ulcer of other part of right lower leg with fat layer exposed I10 Essential (primary) hypertension I50.42 Chronic combined systolic (congestive) and diastolic (congestive) heart failure [name], [name] ([id number]) [id number] Page 4 of 8 I49.9 Cardiac arrhythmia, unspecified Follow-up Appointments Return Appointment in 1 week. Nurse Visit as needed - return for wrap to be replaced Bathing/ Shower/ Hygiene May shower with wound dressing protected  with water repellent cover or cast protector. Anesthetic (Use 'Patient Medications' Section for Anesthetic Order Entry) Lidocaine applied to wound bed Edema Control - Lymphedema / Segmental Compressive Device / Other Elevate, Exercise Daily and A void Standing for Long Periods of Time. Elevate legs to the level of the heart and pump ankles as often as possible Elevate leg(s) parallel to the floor when sitting. Compression Pump: Use compression pump on left lower extremity for 60 minutes, twice daily. Compression Pump: Use compression pump on right lower extremity for 60 minutes, twice daily. DO YOUR BEST to sleep in the bed at night. DO NOT sleep in your recliner. Long hours of sitting in a recliner leads to swelling of the legs and/or potential wounds on your backside. Wound Treatment Wound #2 - Lower Leg Wound Laterality: Left, Medial Cleanser: Soap and Water 2 x Per Week/30 Days Discharge Instructions: Gently cleanse wound with antibacterial soap, rinse and pat dry prior to dressing wounds Peri-Wound Care: AandD Ointment 2 x Per Week/30 Days Discharge Instructions: Apply AandD Ointment to leg Peri-Wound Care: Desitin Maximum Strength Ointment 4 (oz) 2 x Per Week/30 Days Discharge  Instructions: use to anchor top of wrap Prim Dressing: AquacelAg Advantage Dressing, 4x5 (in/in) 2 x Per Week/30 Days ary Discharge Instructions: Apply to wound as directed Secondary Dressing: ABD Pad 5x9 (in/in) 2 x Per Week/30 Days Discharge Instructions: Cover with ABD pad Compression Wrap: Urgo K2 Lite, two layer compression system, large 2 x Per Week/30 Days Electronic Signature(s) Signed: 12/22/2022 5:13:20 PM By: Angelina Pih Signed: 12/22/2022 7:04:22 PM By: Allen Derry PA-C Entered By: Angelina Pih on 12/22/2022 16:48:47 -------------------------------------------------------------------------------- Problem List Details Patient Name: Date of Service: Matthew Burly [name] 12/22/2022 3:00  PM Medical Record Number: [id number] Patient Account Number: [id number] Date of Birth/Sex: Treating RN: July 19, 1955 (67 y.o. [name] Primary Care Provider: [name] Other Clinician: Referring Provider: Treating Provider/Extender: Matthew Folks in Treatment: 22 Active Problems ICD-10 Encounter Code Description Active Date MDM Diagnosis E11.622 Type 2 diabetes mellitus with other skin ulcer 07/16/2022 No Yes THI, SOLDNER ([id number]) [id number] Page 5 of 8 I89.0 Lymphedema, not elsewhere classified 07/16/2022 No Yes L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2/06/2022 No Yes L97.812 Non-pressure chronic ulcer of other part of right lower leg with fat layer 07/16/2022 No Yes exposed I10 Essential (primary) hypertension 07/16/2022 No Yes I50.42 Chronic combined systolic (congestive) and diastolic (congestive) heart failure 07/16/2022 No Yes I49.9 Cardiac arrhythmia, unspecified 07/16/2022 No Yes Inactive Problems Resolved Problems Electronic Signature(s) Signed: 12/22/2022 2:50:45 PM By: Allen Derry PA-C Entered By: Allen Derry on 12/22/2022 14:50:45 -------------------------------------------------------------------------------- Progress Note Details Patient Name: Date of Service: [name], Matthew [name] 12/22/2022 3:00 PM Medical Record Number: [id number] Patient Account Number: [id number] Date of Birth/Sex: Treating RN: 12-15-1955 (67 y.o. [name] Primary Care Provider: [name] Other Clinician: Referring Provider: Treating Provider/Extender: Matthew Folks in Treatment: 22 Subjective Chief Complaint Information obtained from Patient Bilateral LE ulcers and lymphedema History of Present Illness (HPI) 07-16-2022 upon evaluation today patient appears to be doing poorly currently in regard to his his legs. The left leg is actually worse than the right. This  is a patient that is previously been seen in the [place] office and at that point was doing really quite well with compression wrapping and often alginate are either Hydrofera Blue dressings. Fortunately he has gone since November 2020 until just current with out having any issue or need for wound care services which is great news. Unfortunately he is here today because he is having some issues at this point. Patient does have a history of several medical conditions which include diabetes mellitus type 2, lymphedema, hypertension, congestive heart failure, and cardiac arrhythmia though is not sure that this is actually atrial fibrillation based on what he has been told. He does have lymphedema pumps but tells me he has not used them since the summer when he had to move to a remodel of his building and subsequently never unpacked them. 07-23-2022 upon evaluation today patient appears to be doing well with regard to his legs. He is going require some sharp debridement today but overall seems to be making some progress. I am pleased in that regard he has much less drainage that he had did last week I think the compression wraps are definitely [name], [name] ([id number]) [id number] Page 6 of 8 helping. 07-30-2022 upon evaluation today patient appears to be doing poorly in regard to his legs he actually has blue-green drainage which I think is consistent with Pseudomonas. I believe that we  need to address this as soon as possible and I discussed that with the patient today as well. Fortunately I do not see any signs of active infection locally nor systemically at this time which is great news. No fevers, chills, nausea, vomiting, or diarrhea. 08-13-2022 upon evaluation today patient appears to be doing well currently in regard to his leg ulcers which are actually looking much better. Fortunately there does not appear to be any signs of active infection at this time which is  great news. No fevers, chills, nausea, vomiting, or diarrhea. 3/7; patient with predominantly a left medial lower extremity wound as well as several smaller areas and a small area on the right lateral. His Jodie Echevaria came in today. We are using silver alginate as the primary dressing 09-03-2022 upon evaluation today patient appears to be doing well currently in regard to his wounds. He has been tolerating the dressing changes without complication. Fortunately there does not appear to be any signs of infection there is needed however for sharp debridement. 09-10-2022 upon evaluation today patient appears to be doing well currently in regard to his wound. Has been tolerating the dressing changes without complication. Fortunately there does not appear to be any signs of infection looking systemically which is great news and overall I am extremely pleased with where we stand today. I do not see any signs of infection. 09-17-2022 upon evaluation today patient actually is making signs of improvement the good news is he is making good improvement. With that being said he still is having quite a time keeping the swelling down. We have been using Tubigrip I think that still probably our best option but nonetheless I think that we are making progress with regard to his wounds. 09-24-2022 upon evaluation today patient appears to be doing well currently in regard to his wounds is continuing to make improvements at this point which is great news and overall I am extremely pleased with where we stand today. Fortunately I do not see any evidence of active infection at this time which is great news and in general I think he is doing quite well with the [facility] and silver alginate. 10-01-2022 upon evaluation today patient appears to be doing pretty well currently in regard to his wounds. He has been tolerating the dressing changes without complication. Fortunately I do not see any evidence of active infection locally nor  systemically which is great news and I do believe that 10-08-2022 upon evaluation today patient's wounds actually are showing some signs of improvement which is great news. Still this is very slow to heal I really feel like he would do better if we can get him in some stronger compression wrapping he does have the juxta lite compression wraps therefore we can actually see about doing that over top of his Tubigrip to see if that can be beneficial. He does not love the hide he had as they are not the most comfortable thing for him but nonetheless I think it would be beneficial he is in agreement with going forward with this. 10-15-2022 upon evaluation today patient appears to be doing well currently in regard to his wounds that do not appear to be infected which is good news. With that being said unfortunately he still continues to have significant amount of swelling we really need to do something to try to get the swelling under control. I do not see any signs of infection locally nor systemically but at the same time the amount of edema is  tremendous. The juxta lite helps but again he is only taking the fluid pills once or twice a week due to the amount that makes him go to the restroom. Therefore he does not take this daily which is really what he needs on a more regular basis. We need to try to get some compression on and is a little bit stronger. 10-22-2022 upon evaluation today patient actually appears to be making some really good progress in regard to his wound. The compression wrap was put on last time actually doing a great job he looks much improved and in general I feel like that we are making great progress. I do not see any evidence of active infection locally nor systemically which is great news. 10-29-2022 upon evaluation today patient appears to be doing decently well in regard to his legs currently. I feel like that his swelling is much better I feel like that he is also doing much better in  regard to the wounds in general. I do not see any signs of active infection which is great news and in general I think that he is making excellent progress towards complete resolution. The patient does seem to have 1 issue that is with his wraps actually slipping down working I definitely need to work on that aspect. 11-05-2022 upon evaluation today patient appears to be doing excellent in regard to his wound. He has been tolerating the dressing changes without complication. He actually seems to be making excellent progress I am extremely pleased with where things stand currently. I do believe that we are really making good progress and I think that we are on the right track towards complete closure I think the compression wrap has been a dramatic shift in a good way for him as far as getting the wounds healed a lot of these areas that we will continue to leave Completely sealed up. 11-12-2022 upon evaluation today patient appears to be doing well currently in regard to his leg ulcer. The compression wrapping seems to be doing an excellent job. Fortunately I do not see any evidence of active infection locally nor systemically which is great news and in general I do believe that we are making good headway here towards complete closure. 11-19-2022 upon evaluation today patient appears to be doing well currently in regard to his wounds. He is actually showing signs of excellent improvement and very pleased with where we stand I think that he is making great progress. I do not see any evidence of infection at this time which is great news. 11-26-2022 upon evaluation today patient appears to be doing well currently in regard to his wound. He is actually been tolerating the dressing changes without complication. Fortunately I do not see any evidence of active infection locally nor systemically which is great news I think he is making excellent progress here towards closure. 12-03-2022 upon evaluation today  patient appears to be doing well currently in regard to his wound. He in fact at both locations is doing great and seems to be making excellent progress and very pleased in that regard. I do not see any signs of active infection at this time. 12-10-2022 upon evaluation patient's wound is actually significantly smaller with one of the satellite lesions closed and there is just 1 small area remaining and this is actually doing quite well. I am very pleased with her progress. 12-15-2022 upon evaluation today patient appears to be doing actually pretty well in regard to his wound on the left  leg. He does have a little area on the backside that open that was previously close last week due to the fact that his wrap got wet over the weekend and he had to make do with stuff they can find at the drugstore. They made this work out however and the good news is he actually seems to be doing significantly better and the wound looks fine except for the fact that he has a couple of areas that just reopen on the backside very tiny. Nonetheless I do believe that we can go ahead and see what we do about trying to get this moving in the right direction yet again. 12-22-2022 upon evaluation today patient appears to be doing poorly in regard to his wrap slid down and his leg is extremely swollen. It is also painful secondary to it being so swollen. Fortunately I do not see any evidence of infection right now but at the same time I do believe that if we do not get the swelling under control is not really good to alleviate his discomfort. We definitely can need to change his wrap out however in short order. [address] DUSTN, [name] ([id number]) [id number] Page 7 of 8 Constitutional Obese and well-hydrated in no acute distress. Vitals Time Taken: 3:00 PM, Height: 73 in, Weight: 358 lbs, BMI: 47.2, Temperature: 97.8 F, Pulse: 87 bpm, Respiratory Rate: 18 breaths/min, Blood Pressure: 111/71  mmHg. Respiratory normal breathing without difficulty. Psychiatric this patient is able to make decisions and demonstrates good insight into disease process. Alert and Oriented x 3. pleasant and cooperative. General Notes: Upon inspection patient's wound bed actually showed signs of good granulation epithelization at this point. Fortunately I do not see any evidence of infection locally nor systemically which is great news and in general I think that we are headed in the right direction here. The only issue is that his wrap slipped and subsequently this has caused the wound to worsen as well as the leg to swell dramatically. Will need to bring in for nurse visits in order to change this wrap out. Integumentary (Hair, Skin) Wound #2 status is Open. Original cause of wound was Gradually Appeared. The date acquired was: 06/15/2022. The wound has been in treatment 22 weeks. The wound is located on the Left,Medial Lower Leg. The wound measures 3.3cm length x 3.3cm width x 0.1cm depth; 8.553cm^2 area and 0.855cm^3 volume. There is Fat Layer (Subcutaneous Tissue) exposed. There is no tunneling or undermining noted. There is a medium amount of serosanguineous drainage noted. There is large (67-100%) pink, pale granulation within the wound bed. There is a small (1-33%) amount of necrotic tissue within the wound bed. 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 There was a Double Layer Compression Therapy  Procedure by Angelina Pih, RN. Post procedure Diagnosis Wound #2: Same as Pre-Procedure Plan Follow-up Appointments: Return Appointment in 1 week. Nurse Visit as needed - return for wrap to be replaced Bathing/ Shower/ Hygiene: May shower with wound dressing protected with water repellent cover or cast protector. Anesthetic (Use 'Patient Medications' Section for Anesthetic Order  Entry): Lidocaine applied to wound bed Edema Control - Lymphedema / Segmental Compressive Device / Other: Elevate, Exercise Daily and Avoid Standing for Long Periods of Time. Elevate legs to the level of the heart and pump ankles as often as possible Elevate leg(s) parallel to the floor when sitting. Compression Pump: Use compression pump on left lower extremity for 60 minutes, twice daily. Compression Pump: Use compression pump on right lower extremity for 60 minutes, twice daily. DO YOUR BEST to sleep in the bed at night. DO NOT sleep in your recliner. Long hours of sitting in a recliner leads to swelling of the legs and/or potential wounds on your backside. WOUND #2: - Lower Leg Wound Laterality: Left, Medial Cleanser: Soap and Water 2 x Per Week/30 Days Discharge Instructions: Gently cleanse wound with antibacterial soap, rinse and pat dry prior to dressing wounds Peri-Wound Care: AandD Ointment 2 x Per Week/30 Days Discharge Instructions: Apply AandD Ointment to leg Peri-Wound Care: Desitin Maximum Strength Ointment 4 (oz) 2 x Per Week/30 Days Discharge Instructions: use to anchor top of wrap Prim Dressing: AquacelAg Advantage Dressing, 4x5 (in/in) 2 x Per Week/30 Days ary Discharge Instructions: Apply to wound as directed Secondary Dressing: ABD Pad 5x9 (in/in) 2 x Per Week/30 Days Discharge Instructions: Cover with ABD pad Com pression Wrap: Urgo K2 Lite, two layer compression system, large 2 x Per Week/30 Days [name], [name] ([id number]) [id number] Page 8 of 8 1. I  would recommend currently based on what we are seeing that we have the patient continue to monitor for any signs of worsening or infection. I do believe that we are making headway towards complete closure. 2. I am going to recommend however that with the patient's leg right now being as swollen as it is we probably need to wrap them to get the swelling down but we need to switch this wrap out and short order. I think switching out on Thursday would be ideal. 3. I am also can recommend that the patient should continue with the AandD ointment followed by the Aquacel which I think has done well up to this point. We will see patient back for reevaluation in 1 week here in the clinic. If anything worsens or changes patient will contact our office for additional recommendations. Electronic Signature(s) Signed: 12/22/2022 6:17:21 PM By: Allen Derry PA-C Entered By: Allen Derry on 12/22/2022 18:17:21 -------------------------------------------------------------------------------- SuperBill Details Patient Name: Date of Service: [name], Matthew [name] 12/22/2022 Medical Record Number: [id number] Patient Account Number: [id number] Date of Birth/Sex: Treating RN: Jan 31, 1956 (67 y.o. [name] Primary Care Provider: [name] Other Clinician: Referring Provider: Treating Provider/Extender: Matthew Folks in Treatment: 22 Diagnosis Coding ICD-10 Codes Code Description E11.622 Type 2 diabetes mellitus with other skin ulcer I89.0 Lymphedema, not elsewhere classified L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 L97.812 Non-pressure chronic ulcer of other part of right lower leg with fat layer exposed I10 Essential (primary) hypertension I50.42 Chronic combined systolic (congestive) and diastolic (congestive) heart failure I49.9 Cardiac arrhythmia, unspecified Facility Procedures : CPT4 Code: 30865784 Description: (Facility Use  Only) [id number] - APPLY MULTLAY COMPRS LWR LT LEG Modifier: Quantity: 1 Physician Procedures : CPT4 Code Description Modifier [id number] - WC PHYS LEVEL 3 - EST PT ICD-10 Diagnosis Description E11.622 Type 2 diabetes mellitus with other skin ulcer I89.0 Lymphedema, not elsewhere classified L97.822 Non-pressure chronic ulcer of other part of  left lower leg with fat layer exposed  Z61.096 Non-pressure chronic ulcer of other part of right lower leg with fat layer exposed Quantity: 1 Electronic Signature(s) Signed: 12/22/2022 6:17:43 PM By: Allen Derry PA-C Previous Signature: 12/22/2022 4:49:01 PM Version By: Angelina Pih Entered By: Allen Derry on 12/22/2022 18:17:42

## 2022-12-22 NOTE — Progress Notes (Addendum)
ROYZELL, ARMINIO ([id number]) [id number] Page 1 of 9 Visit Report for 12/22/2022 Arrival Information Details Patient Name: Date of Service: [name] 12/22/2022 3:00 PM Medical Record Number: [id number] Patient Account Number: [id number] Date of Birth/Sex: Treating RN: 08/08/1955 (68 y.o. [name] Primary Care Martina Brodbeck: [name] Other Clinician: Referring Yong Wahlquist: Treating Vonette Grosso/Extender: Matthew Folks in Treatment: 22 Visit Information History Since Last Visit Added or deleted any medications: No Patient Arrived: [name] Any new allergies or adverse reactions: No Arrival Time: 15:16 Had a fall or experienced change in No Accompanied By: self activities of daily living that may affect Transfer Assistance: None risk of falls: Patient Identification Verified: Yes Hospitalized since last visit: No Secondary Verification Process Completed: Yes Has Dressing in Place as Prescribed: Yes Patient Requires Transmission-Based Precautions: No Has Compression in Place as Prescribed: Yes Patient Has Alerts: No Pain Present Now: Yes Electronic Signature(s) Signed: 12/22/2022 5:13:20 PM By: Angelina Pih Entered By: Angelina Pih on 12/22/2022 15:16:55 -------------------------------------------------------------------------------- Clinic Level of Care Assessment Details Patient Name: Date of Service: [name] 12/22/2022 3:00 PM Medical Record Number: [id number] Patient Account Number: [id number] Date of Birth/Sex: Treating RN: 1955-06-17 (67 y.o. [name] Primary Care Graciella Arment: [name] Other Clinician: Referring Marie Borowski: Treating Clarence Dunsmore/Extender: Matthew Folks in Treatment: 22 Clinic Level of Care Assessment Items TOOL 1 Quantity Score []  - 0 Use when EandM and Procedure is performed on INITIAL visit ASSESSMENTS - Nursing Assessment / Reassessment []  - 0 General  Physical Exam (combine w/ comprehensive assessment (listed just below) when performed on new pt. evals) []  - 0 Comprehensive Assessment (HX, ROS, Risk Assessments, Wounds Hx, etc.) ASSESSMENTS - Wound and Skin Assessment / Reassessment []  - 0 Dermatologic / Skin Assessment (not related to wound area) [name], [name] ([id number]) [id number] Page 2 of 9 ASSESSMENTS - Ostomy and/or Continence Assessment and Care []  - 0 Incontinence Assessment and Management []  - 0 Ostomy Care Assessment and Management (repouching, etc.) PROCESS - Coordination of Care []  - 0 Simple Patient / Family Education for ongoing care []  - 0 Complex (extensive) Patient / Family Education for ongoing care []  - 0 Staff obtains Chiropractor, Records, T Results / Process Orders est []  - 0 Staff telephones HHA, Nursing Homes / Clarify orders / etc []  - 0 Routine Transfer to another Facility (non-emergent condition) []  - 0 Routine Hospital Admission (non-emergent condition) []  - 0 New Admissions / Manufacturing engineer / Ordering NPWT Apligraf, etc. , []  - 0 Emergency Hospital Admission (emergent condition) PROCESS - Special Needs []  - 0 Pediatric / Minor Patient Management []  - 0 Isolation Patient Management []  - 0 Hearing / Language / Visual special needs []  - 0 Assessment of Community assistance (transportation, D/C planning, etc.) []  - 0 Additional assistance / Altered mentation []  - 0 Support Surface(s) Assessment (bed, cushion, seat, etc.) INTERVENTIONS - Miscellaneous []  - 0 External ear exam []  - 0 Patient Transfer (multiple staff / Nurse, adult / Similar devices) []  - 0 Simple Staple / Suture removal (25 or less) []  - 0 Complex Staple / Suture removal (26 or more) []  - 0 Hypo/Hyperglycemic Management (do not check if billed separately) []  - 0 Ankle / Brachial Index (ABI) - do not check if billed separately Has the patient been seen at the hospital within the last  three years: Yes Total Score: 0 Level Of Care: ____ Electronic Signature(s) Signed: 12/22/2022 5:13:20 PM By: Angelina Pih Entered By:  Angelina Pih on 12/22/2022 16:48:53 -------------------------------------------------------------------------------- Compression Therapy Details Patient Name: Date of Service: [name] 12/22/2022 3:00 PM Medical Record Number: [id number] Patient Account Number: [id number] Date of Birth/Sex: Treating RN: March 31, 1956 (67 y.o. [name] Primary Care Kaston Faughn: [name] Other Clinician: Referring Beth Spackman: Treating Ting Cage/Extender: Matthew Folks in Treatment: 22 Compression Therapy Performed for Wound Assessment: Wound #2 Left,Medial Lower Leg Performed By: Holly Bodily, RN Compression Type: Double Shelby Dubin ([id number]) [id number] Page 3 of 9 Post Procedure Diagnosis Same as Pre-procedure Electronic Signature(s) Signed: 12/22/2022 4:48:32 PM By: Angelina Pih Entered By: Angelina Pih on 12/22/2022 16:48:32 -------------------------------------------------------------------------------- Encounter Discharge Information Details Patient Name: Date of Service: [name], Matthew [name] 12/22/2022 3:00 PM Medical Record Number: [id number] Patient Account Number: [id number] Date of Birth/Sex: Treating RN: [birth year] (67 y.o. [name] Primary Care Brunette Lavalle: [name] Other Clinician: Referring Reyan Helle: Treating Marilynn Ekstein/Extender: Matthew Folks in Treatment: 22 Encounter Discharge Information Items Discharge Condition: Stable Ambulatory Status: Cane Discharge Destination: Home Transportation: Private Auto Accompanied By: self Schedule Follow-up Appointment: Yes Clinical Summary of Care: Electronic Signature(s) Signed: 12/22/2022 4:50:10 PM By: Angelina Pih Entered By: Angelina Pih on 12/22/2022  16:50:10 -------------------------------------------------------------------------------- Lower Extremity Assessment Details Patient Name: Date of Service: [name] 12/22/2022 3:00 PM Medical Record Number: [id number] Patient Account Number: [id number] Date of Birth/Sex: Treating RN: 29-Aug-1955 (67 y.o. [name] Primary Care Kelli Egolf: [name] Other Clinician: Referring Hampton Wixom: Treating Yaqub Arney/Extender: Matthew Folks in Treatment: 22 Edema Assessment Assessed: [Left: No] [Right: No] Edema: [Left: Ye] [Right: s] Calf [name], [name] ([id number]) [id number] Page 4 of 9 Left: Right: Point of Measurement: 35 cm From Medial Instep 64 cm Ankle Left: Right: Point of Measurement: 10 cm From Medial Instep 30 cm Vascular Assessment Pulses: Dorsalis Pedis Palpable: [Left:Yes] Electronic Signature(s) Signed: 12/22/2022 5:13:20 PM By: Angelina Pih Entered By: Angelina Pih on 12/22/2022 15:18:04 -------------------------------------------------------------------------------- Multi Wound Chart Details Patient Name: Date of Service: Matthew Burly [name] 12/22/2022 3:00 PM Medical Record Number: [id number] Patient Account Number: [id number] Date of Birth/Sex: Treating RN: 06-18-1955 (67 y.o. [name] Primary Care Jule Schlabach: [name] Other Clinician: Referring Alixandria Friedt: Treating Izaak Sahr/Extender: Matthew Folks in Treatment: 22 Vital Signs Height(in): 73 Pulse(bpm): 87 Weight(lbs): 358 Blood Pressure(mmHg): 111/71 Body Mass Index(BMI): 47.2 Temperature(F): 97.8 Respiratory Rate(breaths/min): 18 [2:Photos:] [N/A:N/A] Left, Medial Lower Leg N/A N/A Wound Location: Gradually Appeared N/A N/A Wounding Event: Venous Leg Ulcer N/A N/A Primary Etiology: Diabetic Wound/Ulcer of the Lower N/A N/A Secondary Etiology: Extremity Arrhythmia, Congestive Heart Failure, N/A  N/A Comorbid HistoryNOA, [name] ([id number]) [id number] Page 5 of 9 Hypertension, Type II Diabetes 06/15/2022 N/A N/A Date Acquired: 22 N/A N/A Weeks of Treatment: Open N/A N/A Wound Status: No N/A N/A Wound Recurrence: 3.3x3.3x0.1 N/A N/A Measurements L x W x D (cm) 8.553 N/A N/A A (cm) : rea 0.855 N/A N/A Volume (cm) : 98.60% N/A N/A % Reduction in Area: 98.60% N/A N/A % Reduction in Volume: Full Thickness Without Exposed N/A N/A Classification: Support Structures Medium N/A N/A Exudate Amount: Serosanguineous N/A N/A Exudate Type: red, brown N/A N/A Exudate Color: Large (67-100%) N/A N/A Granulation Amount: Pink, Pale N/A N/A Granulation Quality: Small (1-33%) N/A N/A Necrotic Amount: Fat Layer (Subcutaneous Tissue): Yes N/A N/A Exposed Structures: Fascia: No Tendon: No Muscle: No Joint: No Bone: No None N/A N/A Epithelialization: Treatment Notes Electronic Signature(s) Signed: 12/22/2022 4:48:18  PM By: Angelina Pih Entered By: Angelina Pih on 12/22/2022 16:48:18 -------------------------------------------------------------------------------- Multi-Disciplinary Care Plan Details Patient Name: Date of Service: [name], Matthew [name] 12/22/2022 3:00 PM Medical Record Number: [id number] Patient Account Number: [id number] Date of Birth/Sex: Treating RN: 24-Feb-1956 (67 y.o. [name] Primary Care Dariela Stoker: [name] Other Clinician: Referring Anjeanette Petzold: Treating Kiyona Mcnall/Extender: Matthew Folks in Treatment: 22 Active Inactive Venous Leg Ulcer Nursing Diagnoses: Actual venous Insuffiency (use after diagnosis is confirmed) Goals: Patient will maintain optimal edema control Date Initiated: 10/15/2022 Target Resolution Date: 01/12/2023 Goal Status: Active Patient/caregiver will verbalize understanding of disease process and disease management Date Initiated: 10/15/2022 Date Inactivated:  11/12/2022 Target Resolution Date: 10/15/2022 Goal Status: Met Verify adequate tissue perfusion prior to therapeutic compression application Date Initiated: 10/15/2022 Date Inactivated: 11/12/2022 Target Resolution Date: 10/15/2022 Goal Status: Met Interventions: Assess peripheral edema status every visit. Compression as ordered [name], [name] ([id number]) [id number] Page 6 of 9 Provide education on venous insufficiency Treatment Activities: Therapeutic compression applied : 10/15/2022 Notes: Electronic Signature(s) Signed: 12/22/2022 4:49:13 PM By: Angelina Pih Entered By: Angelina Pih on 12/22/2022 16:49:13 -------------------------------------------------------------------------------- Pain Assessment Details Patient Name: Date of Service: Matthew Burly [name] 12/22/2022 3:00 PM Medical Record Number: [id number] Patient Account Number: [id number] Date of Birth/Sex: Treating RN: 1956-03-19 (67 y.o. [name] Primary Care Clarance Bollard: [name] Other Clinician: Referring Chrstopher Malenfant: Treating Aadhira Heffernan/Extender: Matthew Folks in Treatment: 22 Active Problems Location of Pain Severity and Description of Pain Patient Has Paino Yes Site Locations Rate the pain. Current Pain Level: 10 Pain Management and Medication Current Pain Management: Notes swelling in left LL back of calf Electronic Signature(s) Signed: 12/22/2022 5:13:20 PM By: Angelina Pih Entered By: Angelina Pih on 12/22/2022 15:17:34 [name] ([id number]) [id number] Page 7 of 9 -------------------------------------------------------------------------------- Patient/Caregiver Education Details Patient Name: Date of Service: [name] 7/9/2024andnbsp3:00 PM Medical Record Number: [id number] Patient Account Number: [id number] Date of Birth/Gender: Treating RN: 1956-01-31 (67 y.o. [name] Primary Care Physician:  [name] Other Clinician: Referring Physician: Treating Physician/Extender: Matthew Folks in Treatment: 22 Education Assessment Education Provided To: Patient Education Topics Provided Wound/Skin Impairment: Handouts: Caring for Your Ulcer Methods: Explain/Verbal Responses: State content correctly Electronic Signature(s) Signed: 12/22/2022 5:13:20 PM By: Angelina Pih Entered By: Angelina Pih on 12/22/2022 16:49:26 -------------------------------------------------------------------------------- Wound Assessment Details Patient Name: Date of Service: Matthew Burly [name] 12/22/2022 3:00 PM Medical Record Number: [id number] Patient Account Number: [id number] Date of Birth/Sex: Treating RN: 1955/12/15 (67 y.o. [name] Primary Care [place] Nonaka: [name] Other Clinician: Referring Karrie Fluellen: Treating Falana Clagg/Extender: Matthew Folks in Treatment: 22 Wound Status Wound Number: 2 Primary Etiology: Venous Leg Ulcer Wound Location: Left, Medial Lower Leg Secondary Diabetic Wound/Ulcer of the Lower Extremity Etiology: Wounding Event: Gradually Appeared Wound Status: Open Date Acquired: 06/15/2022 Comorbid Arrhythmia, Congestive Heart Failure, Hypertension, Type Weeks Of Treatment: 22 History: II Diabetes Clustered Wound: No Photos [name], [name] ([id number]) [id number] Page 8 of 9 Wound Measurements Length: (cm) 3.3 Width: (cm) 3.3 Depth: (cm) 0.1 Area: (cm) 8.553 Volume: (cm) 0.855 % Reduction in Area: 98.6% % Reduction in Volume: 98.6% Epithelialization: None Tunneling: No Undermining: No Wound Description Classification: Full Thickness Without Exposed Suppor Exudate Amount: Medium Exudate Type: Serosanguineous Exudate Color: red, brown t Structures Foul Odor After Cleansing: No Slough/Fibrino Yes Wound Bed Granulation Amount: Large (67-100%) Exposed Structure Granulation Quality:  Pink, Pale Fascia Exposed: No Necrotic Amount: Small (1-33%)  Fat Layer (Subcutaneous Tissue) Exposed: Yes Tendon Exposed: No Muscle Exposed: No Joint Exposed: No Bone Exposed: No Treatment Notes Wound #2 (Lower Leg) Wound Laterality: Left, Medial Cleanser Soap and Water Discharge Instruction: Gently cleanse wound with antibacterial soap, rinse and pat dry prior to dressing wounds Peri-Wound Care AandD Ointment Discharge Instruction: Apply AandD Ointment to leg Desitin Maximum Strength Ointment 4 (oz) Discharge Instruction: use to anchor top of wrap Topical Primary Dressing AquacelAg Advantage Dressing, 4x5 (in/in) Discharge Instruction: Apply to wound as directed Secondary Dressing ABD Pad 5x9 (in/in) Discharge Instruction: Cover with ABD pad Secured With Compression Wrap Urgo K2 Lite, two layer compression system, large Compression Stockings Add-Ons Electronic Signature(s) Signed: 12/22/2022 5:13:20 PM By: Angelina Pih Entered By: Angelina Pih on 12/22/2022 15:22:12 [name] ([id number]) [id number] Page 9 of 9 -------------------------------------------------------------------------------- Vitals Details Patient Name: Date of Service: [name] 12/22/2022 3:00 PM Medical Record Number: [id number] Patient Account Number: [id number] Date of Birth/Sex: Treating RN: 11-Jun-1956 (67 y.o. [name] Primary Care Ashlinn Hemrick: [name] Other Clinician: Referring Satrina Magallanes: Treating Shalandria Elsbernd/Extender: Matthew Folks in Treatment: 22 Vital Signs Time Taken: 15:00 Temperature (F): 97.8 Height (in): 73 Pulse (bpm): 87 Weight (lbs): 358 Respiratory Rate (breaths/min): 18 Body Mass Index (BMI): 47.2 Blood Pressure (mmHg): 111/71 Reference Range: 80 - 120 mg / dl Electronic Signature(s) Signed: 12/22/2022 5:13:20 PM By: Angelina Pih Entered By: Angelina Pih on 12/22/2022 15:17:15

## 2022-12-24 DIAGNOSIS — E11622 Type 2 diabetes mellitus with other skin ulcer: Secondary | ICD-10-CM | POA: Diagnosis not present

## 2022-12-24 NOTE — Progress Notes (Signed)
OTHO, MICHALIK ([id number]) [id number] Page 1 of 4 Visit Report for 12/24/2022 Arrival Information Details Patient Name: Date of Service: [name] 12/24/2022 3:45 PM Medical Record Number: [id number] Patient Account Number: [id number] Date of Birth/Sex: Treating RN: 15-Oct-1955 (67 y.o. [name] Primary Care [name]: [name] Other Clinician: Referring [name]: Treating [name]/Extender: [name] in Treatment: 23 Visit Information History Since Last Visit Added or deleted any medications: No Patient Arrived: [name] Any new allergies or adverse reactions: No Arrival Time: 15:49 Had a fall or experienced change in No Accompanied By: friend activities of daily living that may affect Transfer Assistance: None risk of falls: Patient Identification Verified: Yes Hospitalized since last visit: No Secondary Verification Process Completed: Yes Has Dressing in Place as Prescribed: Yes Patient Requires Transmission-Based Precautions: No Has Compression in Place as Prescribed: Yes Patient Has Alerts: No Pain Present Now: No Electronic Signature(s) Signed: 12/24/2022 4:12:51 PM By: [name] Entered By: [name] on 12/24/2022 15:49:40 -------------------------------------------------------------------------------- Clinic Level of Care Assessment Details Patient Name: Date of Service: [name] 12/24/2022 3:45 PM Medical Record Number: [id number] Patient Account Number: [id number] Date of Birth/Sex: Treating RN: [birth year] (67 y.o. [name] Primary Care Layn Kye: [name] Other Clinician: Referring Yared Barefoot: Treating Shravan Salahuddin/Extender: [name] in Treatment: 23 Clinic Level of Care Assessment Items TOOL 1 Quantity Score []  - 0 Use when EandM and Procedure is performed on INITIAL visit ASSESSMENTS - Nursing Assessment / Reassessment []  - 0 General  Physical Exam (combine w/ comprehensive assessment (listed just below) when performed on new pt. evals) []  - 0 Comprehensive Assessment (HX, ROS, Risk Assessments, Wounds Hx, etc.) ASSESSMENTS - Wound and Skin Assessment / Reassessment []  - 0 Dermatologic / Skin Assessment (not related to wound area) [name], LASER ([id number]) [id number] Page 2 of 4 ASSESSMENTS - Ostomy and/or Continence Assessment and Care []  - 0 Incontinence Assessment and Management []  - 0 Ostomy Care Assessment and Management (repouching, etc.) PROCESS - Coordination of Care []  - 0 Simple Patient / Family Education for ongoing care []  - 0 Complex (extensive) Patient / Family Education for ongoing care []  - 0 Staff obtains Chiropractor, Records, T Results / Process Orders est []  - 0 Staff telephones HHA, Nursing Homes / Clarify orders / etc []  - 0 Routine Transfer to another Facility (non-emergent condition) []  - 0 Routine Hospital Admission (non-emergent condition) []  - 0 New Admissions / Manufacturing engineer / Ordering NPWT Apligraf, etc. , []  - 0 Emergency Hospital Admission (emergent condition) PROCESS - Special Needs []  - 0 Pediatric / Minor Patient Management []  - 0 Isolation Patient Management []  - 0 Hearing / Language / Visual special needs []  - 0 Assessment of Community assistance (transportation, D/C planning, etc.) []  - 0 Additional assistance / Altered mentation []  - 0 Support Surface(s) Assessment (bed, cushion, seat, etc.) INTERVENTIONS - Miscellaneous []  - 0 External ear exam []  - 0 Patient Transfer (multiple staff / Nurse, adult / Similar devices) []  - 0 Simple Staple / Suture removal (25 or less) []  - 0 Complex Staple / Suture removal (26 or more) []  - 0 Hypo/Hyperglycemic Management (do not check if billed separately) []  - 0 Ankle / Brachial Index (ABI) - do not check if billed separately Has the patient been seen at the hospital within the last  three years: Yes Total Score: 0 Level Of Care: ____ Electronic Signature(s) Signed: 12/24/2022 4:12:51 PM By: [name] Entered By:  [name] on 12/24/2022 16:12:26 -------------------------------------------------------------------------------- Compression Therapy Details Patient Name: Date of Service: [name] 12/24/2022 3:45 PM Medical Record Number: [id number] Patient Account Number: [id number] Date of Birth/Sex: Treating RN: 02/10/1956 (67 y.o. [name] Primary Care Hardin Hardenbrook: [name] Other Clinician: Referring Chazz Philson: Treating Avari Nevares/Extender: [name] in Treatment: 23 Compression Therapy Performed for Wound Assessment: Wound #2 Left,Medial Lower Leg Performed By: Matthew Bodily, RN Compression Type: Double Shelby Dubin ([id number]) [id number] Page 3 of 4 Electronic Signature(s) Signed: 12/24/2022 4:12:51 PM By: [name] Entered By: [name] on 12/24/2022 15:50:09 -------------------------------------------------------------------------------- Encounter Discharge Information Details Patient Name: Date of Service: Matthew Burly [name] 12/24/2022 3:45 PM Medical Record Number: [id number] Patient Account Number: [id number] Date of Birth/Sex: Treating RN: [birth year] (67 y.o. [name] Primary Care Reginae Wolfrey: [name] Other Clinician: Referring Shalondra Wunschel: Treating Thurmond Hildebran/Extender: [name] in Treatment: 23 Encounter Discharge Information Items Discharge Condition: Stable Ambulatory Status: Cane Discharge Destination: Home Transportation: Private Auto Accompanied By: self Schedule Follow-up Appointment: Yes Clinical Summary of Care: Electronic Signature(s) Signed: 12/24/2022 4:12:20 PM By: [name] Entered By: [name] on 12/24/2022  16:12:20 -------------------------------------------------------------------------------- Wound Assessment Details Patient Name: Date of Service: [name] 12/24/2022 3:45 PM Medical Record Number: [id number] Patient Account Number: [id number] Date of Birth/Sex: Treating RN: 04-11-1956 (67 y.o. [name] Primary Care Erykah Lippert: [name] Other Clinician: Referring Deysy Schabel: Treating Vern Prestia/Extender: [name] in Treatment: 23 Wound Status Wound Number: 2 Primary Venous Leg Ulcer Etiology: Wound Location: Left, Medial Lower Leg Secondary Diabetic Wound/Ulcer of the Lower Extremity Wounding Event: Gradually Appeared Etiology: Date Acquired: 06/15/2022 Wound Status: Open Weeks Of Treatment: 23 Comorbid Arrhythmia, Congestive Heart Failure, Hypertension, Type Clustered Wound: No History: II Diabetes Wound Measurements XACHARY, HAMBLY ([id number]) Length: (cm) 3.3 Width: (cm) 3.3 Depth: (cm) 0.1 Area: (cm) 8.553 Volume: (cm) 0.855 [id number] Page 4 of 4 % Reduction in Area: 98.6% % Reduction in Volume: 98.6% Epithelialization: None Tunneling: No Undermining: No Wound Description Classification: Full Thickness Without Exposed Suppor Exudate Amount: Medium Exudate Type: Serosanguineous Exudate Color: red, brown t Structures Foul Odor After Cleansing: No Slough/Fibrino Yes Wound Bed Granulation Amount: Large (67-100%) Exposed Structure Granulation Quality: Pink, Pale Fascia Exposed: No Necrotic Amount: Small (1-33%) Fat Layer (Subcutaneous Tissue) Exposed: Yes Tendon Exposed: No Muscle Exposed: No Joint Exposed: No Bone Exposed: No Treatment Notes Wound #2 (Lower Leg) Wound Laterality: Left, Medial Cleanser Soap and Water Discharge Instruction: Gently cleanse wound with antibacterial soap, rinse and pat dry prior to dressing wounds Peri-Wound Care AandD Ointment Discharge Instruction: Apply  AandD Ointment to leg Desitin Maximum Strength Ointment 4 (oz) Discharge Instruction: use to anchor top of wrap Topical Primary Dressing AquacelAg Advantage Dressing, 4x5 (in/in) Discharge Instruction: Apply to wound as directed Secondary Dressing Zetuvit Plus 4x4 (in/in) Secured With Compression Wrap Urgo K2 Lite, two layer compression system, large Compression Stockings Add-Ons Electronic Signature(s) Signed: 12/24/2022 4:12:51 PM By: [name] Entered By: [name] on 12/24/2022 15:49:55

## 2022-12-25 NOTE — Progress Notes (Signed)
[name] ([id number]) [id number] Page 1 of 2 Visit Report for 12/24/2022 Physician Orders Details Patient Name: Date of Service: [name] 12/24/2022 3:45 PM Medical Record Number: [id number] Patient Account Number: [id number] Date of Birth/Sex: Treating RN: 1955/09/30 (67 y.o. [name] Primary Care Provider: [name] Other Clinician: Referring Provider: Treating Provider/Extender: [name] in Treatment: 23 Verbal / Phone Orders: No Diagnosis Coding Follow-up Appointments Return Appointment in 1 week. Nurse Visit as needed - return for wrap to be replaced Bathing/ Shower/ Hygiene May shower with wound dressing protected with water repellent cover or cast protector. Anesthetic (Use 'Patient Medications' Section for Anesthetic Order Entry) Lidocaine applied to wound bed Edema Control - Lymphedema / Segmental Compressive Device / Other Elevate, Exercise Daily and A void Standing for Long Periods of Time. Elevate legs to the level of the heart and pump ankles as often as possible Elevate leg(s) parallel to the floor when sitting. Compression Pump: Use compression pump on left lower extremity for 60 minutes, twice daily. Compression Pump: Use compression pump on right lower extremity for 60 minutes, twice daily. DO YOUR BEST to sleep in the bed at night. DO NOT sleep in your recliner. Long hours of sitting in a recliner leads to swelling of the legs and/or potential wounds on your backside. Wound Treatment Wound #2 - Lower Leg Wound Laterality: Left, Medial Cleanser: Soap and Water 2 x Per Week/30 Days Discharge Instructions: Gently cleanse wound with antibacterial soap, rinse and pat dry prior to dressing wounds Peri-Wound Care: AandD Ointment 2 x Per Week/30 Days Discharge Instructions: Apply AandD Ointment to leg Peri-Wound Care: Desitin Maximum Strength Ointment 4 (oz) 2 x Per Week/30 Days Discharge  Instructions: use to anchor top of wrap Prim Dressing: AquacelAg Advantage Dressing, 4x5 (in/in) 2 x Per Week/30 Days ary Discharge Instructions: Apply to wound as directed Secondary Dressing: Zetuvit Plus 4x4 (in/in) 2 x Per Week/30 Days Compression Wrap: Urgo K2 Lite, two layer compression system, large 2 x Per Week/30 Days Electronic Signature(s) Signed: 12/24/2022 4:12:51 PM By: [name] Signed: 12/24/2022 5:17:09 PM By: Matthew Derry PA-C Entered By: [name] on 12/24/2022 16:12:01 [name] ([id number]) [id number] Page 2 of 2 -------------------------------------------------------------------------------- SuperBill Details Patient Name: Date of Service: [name] 12/24/2022 Medical Record Number: [id number] Patient Account Number: [id number] Date of Birth/Sex: Treating RN: 03-01-1956 (67 y.o. [name] Primary Care Provider: [name] Other Clinician: Referring Provider: Treating Provider/Extender: [name] in Treatment: 23 Diagnosis Coding ICD-10 Codes Code Description E11.622 Type 2 diabetes mellitus with other skin ulcer I89.0 Lymphedema, not elsewhere classified L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 L97.812 Non-pressure chronic ulcer of other part of right lower leg with fat layer exposed I10 Essential (primary) hypertension I50.42 Chronic combined systolic (congestive) and diastolic (congestive) heart failure I49.9 Cardiac arrhythmia, unspecified Facility Procedures : CPT4 Code: 62952841 Description: (Facility Use Only) 29581LT - APPLY MULTLAY COMPRS LWR LT LEG Modifier: Quantity: 1 Electronic Signature(s) Signed: 12/24/2022 4:12:37 PM By: [name] Signed: 12/24/2022 5:17:09 PM By: Matthew Derry PA-C Entered By: [name] on 12/24/2022 16:12:36

## 2022-12-31 ENCOUNTER — Encounter: Payer: PPO | Admitting: Physician Assistant

## 2022-12-31 DIAGNOSIS — E11622 Type 2 diabetes mellitus with other skin ulcer: Secondary | ICD-10-CM | POA: Diagnosis not present

## 2022-12-31 DIAGNOSIS — L97822 Non-pressure chronic ulcer of other part of left lower leg with fat layer exposed: Secondary | ICD-10-CM | POA: Diagnosis not present

## 2022-12-31 DIAGNOSIS — I872 Venous insufficiency (chronic) (peripheral): Secondary | ICD-10-CM | POA: Diagnosis not present

## 2022-12-31 NOTE — Progress Notes (Addendum)
[name], [name] ([id number]) [id number] Page 1 of 8 Visit Report for 12/31/2022 Chief Complaint Document Details Patient Name: Date of Service: [name] 12/31/2022 12:00 PM Medical Record Number: [id number] Patient Account Number: [id number] Date of Birth/Sex: Treating RN: 03-18-1956 (67 y.o. [name] Primary Care Provider: [name] Other Clinician: Referring Provider: Treating Provider/Extender: [name] in Treatment: 24 Information Obtained from: Patient Chief Complaint Bilateral LE ulcers and lymphedema Electronic Signature(s) Signed: 12/31/2022 12:31:44 PM By: Matthew Derry PA-C Entered By: [name] on 12/31/2022 12:31:44 -------------------------------------------------------------------------------- HPI Details Patient Name: Date of Service: [name], Matthew [name] 12/31/2022 12:00 PM Medical Record Number: [id number] Patient Account Number: [id number] Date of Birth/Sex: Treating RN: [birth year] (67 y.o. [name] Primary Care Provider: [name] Other Clinician: Referring Provider: Treating Provider/Extender: [name] in Treatment: 24 History of Present Illness HPI Description: 07-16-2022 upon evaluation today patient appears to be doing poorly currently in regard to his his legs. The left leg is actually worse than the right. This is a patient that is previously been seen in the [place] office and at that point was doing really quite well with compression wrapping and often alginate are either Hydrofera Blue dressings. Fortunately he has gone since November 2020 until just current with out having any issue or need for wound care services which is great news. Unfortunately he is here today because he is having some issues at this point. Patient does have a history of several medical conditions which include diabetes mellitus type 2, lymphedema, hypertension, congestive  heart failure, and cardiac arrhythmia though is not sure that this is actually atrial fibrillation based on what he has been told. He does have lymphedema pumps but tells me he has not used them since the summer when he had to move to a remodel of his building and subsequently never unpacked them. 07-23-2022 upon evaluation today patient appears to be doing well with regard to his legs. He is going require some sharp debridement today but overall seems to be making some progress. I am pleased in that regard he has much less drainage that he had did last week I think the compression wraps are definitely helping. 07-30-2022 upon evaluation today patient appears to be doing poorly in regard to his legs he actually has blue-green drainage which I think is consistent with Pseudomonas. I believe that we need to address this as soon as possible and I discussed that with the patient today as well. Fortunately I do not see any signs of active infection locally nor systemically at this time which is great news. No fevers, chills, nausea, vomiting, or diarrhea. [name], [name] ([id number]) [id number] Page 2 of 8 08-13-2022 upon evaluation today patient appears to be doing well currently in regard to his leg ulcers which are actually looking much better. Fortunately there does not appear to be any signs of active infection at this time which is great news. No fevers, chills, nausea, vomiting, or diarrhea. 3/7; patient with predominantly a left medial lower extremity wound as well as several smaller areas and a small area on the right lateral. His [name] came in today. We are using silver alginate as the primary dressing 09-03-2022 upon evaluation today patient appears to be doing well currently in regard to his wounds. He has been tolerating the dressing changes without complication. Fortunately there does not appear to be any signs of infection there is needed however for sharp  debridement. 09-10-2022  upon evaluation today patient appears to be doing well currently in regard to his wound. Has been tolerating the dressing changes without complication. Fortunately there does not appear to be any signs of infection looking systemically which is great news and overall I am extremely pleased with where we stand today. I do not see any signs of infection. 09-17-2022 upon evaluation today patient actually is making signs of improvement the good news is he is making good improvement. With that being said he still is having quite a time keeping the swelling down. We have been using Tubigrip I think that still probably our best option but nonetheless I think that we are making progress with regard to his wounds. 09-24-2022 upon evaluation today patient appears to be doing well currently in regard to his wounds is continuing to make improvements at this point which is great news and overall I am extremely pleased with where we stand today. Fortunately I do not see any evidence of active infection at this time which is great news and in general I think he is doing quite well with the [facility] and silver alginate. 10-01-2022 upon evaluation today patient appears to be doing pretty well currently in regard to his wounds. He has been tolerating the dressing changes without complication. Fortunately I do not see any evidence of active infection locally nor systemically which is great news and I do believe that 10-08-2022 upon evaluation today patient's wounds actually are showing some signs of improvement which is great news. Still this is very slow to heal I really feel like he would do better if we can get him in some stronger compression wrapping he does have the juxta lite compression wraps therefore we can actually see about doing that over top of his Tubigrip to see if that can be beneficial. He does not love the hide he had as they are not the most comfortable thing for him but nonetheless  I think it would be beneficial he is in agreement with going forward with this. 10-15-2022 upon evaluation today patient appears to be doing well currently in regard to his wounds that do not appear to be infected which is good news. With that being said unfortunately he still continues to have significant amount of swelling we really need to do something to try to get the swelling under control. I do not see any signs of infection locally nor systemically but at the same time the amount of edema is tremendous. The juxta lite helps but again he is only taking the fluid pills once or twice a week due to the amount that makes him go to the restroom. Therefore he does not take this daily which is really what he needs on a more regular basis. We need to try to get some compression on and is a little bit stronger. 10-22-2022 upon evaluation today patient actually appears to be making some really good progress in regard to his wound. The compression wrap was put on last time actually doing a great job he looks much improved and in general I feel like that we are making great progress. I do not see any evidence of active infection locally nor systemically which is great news. 10-29-2022 upon evaluation today patient appears to be doing decently well in regard to his legs currently. I feel like that his swelling is much better I feel like that he is also doing much better in regard to the wounds in general. I do not see any signs of  active infection which is great news and in general I think that he is making excellent progress towards complete resolution. The patient does seem to have 1 issue that is with his wraps actually slipping down working I definitely need to work on that aspect. 11-05-2022 upon evaluation today patient appears to be doing excellent in regard to his wound. He has been tolerating the dressing changes without complication. He actually seems to be making excellent progress I am extremely  pleased with where things stand currently. I do believe that we are really making good progress and I think that we are on the right track towards complete closure I think the compression wrap has been a dramatic shift in a good way for him as far as getting the wounds healed a lot of these areas that we will continue to leave Completely sealed up. 11-12-2022 upon evaluation today patient appears to be doing well currently in regard to his leg ulcer. The compression wrapping seems to be doing an excellent job. Fortunately I do not see any evidence of active infection locally nor systemically which is great news and in general I do believe that we are making good headway here towards complete closure. 11-19-2022 upon evaluation today patient appears to be doing well currently in regard to his wounds. He is actually showing signs of excellent improvement and very pleased with where we stand I think that he is making great progress. I do not see any evidence of infection at this time which is great news. 11-26-2022 upon evaluation today patient appears to be doing well currently in regard to his wound. He is actually been tolerating the dressing changes without complication. Fortunately I do not see any evidence of active infection locally nor systemically which is great news I think he is making excellent progress here towards closure. 12-03-2022 upon evaluation today patient appears to be doing well currently in regard to his wound. He in fact at both locations is doing great and seems to be making excellent progress and very pleased in that regard. I do not see any signs of active infection at this time. 12-10-2022 upon evaluation patient's wound is actually significantly smaller with one of the satellite lesions closed and there is just 1 small area remaining and this is actually doing quite well. I am very pleased with her progress. 12-15-2022 upon evaluation today patient appears to be doing actually  pretty well in regard to his wound on the left leg. He does have a little area on the backside that open that was previously close last week due to the fact that his wrap got wet over the weekend and he had to make do with stuff they can find at the drugstore. They made this work out however and the good news is he actually seems to be doing significantly better and the wound looks fine except for the fact that he has a couple of areas that just reopen on the backside very tiny. Nonetheless I do believe that we can go ahead and see what we do about trying to get this moving in the right direction yet again. 12-22-2022 upon evaluation today patient appears to be doing poorly in regard to his wrap slid down and his leg is extremely swollen. It is also painful secondary to it being so swollen. Fortunately I do not see any evidence of infection right now but at the same time I do believe that if we do not get the swelling under control is not  really good to alleviate his discomfort. We definitely can need to change his wrap out however in short order. 12-31-2022 upon evaluation today patient appears to be doing well currently in regard to his wound. He has been tolerating the dressing changes without complication. This actually looks to be doing much better today compared to where we were. Fortunately I do not see any evidence of active infection locally or systemically which is great news. Electronic Signature(s) Signed: 12/31/2022 12:40:12 PM By: Matthew Derry PA-C Entered By: [name] on 12/31/2022 12:40:12 [name] ([id number]) [id number] Page 3 of 8 -------------------------------------------------------------------------------- Physical Exam Details Patient Name: Date of Service: [name] 12/31/2022 12:00 PM Medical Record Number: [id number] Patient Account Number: [id number] Date of Birth/Sex: Treating RN: 1956/03/18 (67 y.o. [name] Primary Care Provider: [name] Other Clinician: Referring Provider: Treating Provider/Extender: [name] in Treatment: 24 Constitutional Well-nourished and well-hydrated in no acute distress. Respiratory normal breathing without difficulty. Psychiatric this patient is able to make decisions and demonstrates good insight into disease process. Alert and Oriented x 3. pleasant and cooperative. Notes Upon inspection patient's wound bed actually showed signs of good granulation epithelization at this point. Fortunately there does not appear to be any evidence of active infection and his wrap stayed up nicely and looks to be doing much better. I am very pleased with where we are today. Electronic Signature(s) Signed: 12/31/2022 12:40:27 PM By: Matthew Derry PA-C Entered By: [name] on 12/31/2022 12:40:26 -------------------------------------------------------------------------------- Physician Orders Details Patient Name: Date of Service: [name], Matthew [name] 12/31/2022 12:00 PM Medical Record Number: [id number] Patient Account Number: [id number] Date of Birth/Sex: Treating RN: 08-13-1955 (67 y.o. [name] Primary Care Provider: [name] Other Clinician: Referring Provider: Treating Provider/Extender: [name] in Treatment: 24 Verbal / Phone Orders: No Diagnosis Coding ICD-10 Coding Code Description E11.622 Type 2 diabetes mellitus with other skin ulcer I89.0 Lymphedema, not elsewhere classified L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 L97.812 Non-pressure chronic ulcer of other part of right lower leg with fat layer exposed I10 Essential (primary) hypertension I50.42 Chronic combined systolic (congestive) and diastolic (congestive) heart failure I49.9 Cardiac arrhythmia, unspecified [name], [name] ([id number]) [id number] Page 4  of 8 Follow-up Appointments Return Appointment in 1 week. Nurse Visit as needed - return for wrap to be replaced Bathing/ Shower/ Hygiene May shower with wound dressing protected with water repellent cover or cast protector. Anesthetic (Use 'Patient Medications' Section for Anesthetic Order Entry) Lidocaine applied to wound bed Edema Control - Lymphedema / Segmental Compressive Device / Other Elevate, Exercise Daily and A void Standing for Long Periods of Time. Elevate legs to the level of the heart and pump ankles as often as possible Elevate leg(s) parallel to the floor when sitting. Compression Pump: Use compression pump on left lower extremity for 60 minutes, twice daily. Compression Pump: Use compression pump on right lower extremity for 60 minutes, twice daily. DO YOUR BEST to sleep in the bed at night. DO NOT sleep in your recliner. Long hours of sitting in a recliner leads to swelling of the legs and/or potential wounds on your backside. Wound Treatment Wound #2 - Lower Leg Wound Laterality: Left, Medial Cleanser: Soap and Water 2 x Per Week/30 Days Discharge Instructions: Gently cleanse wound with antibacterial soap, rinse and pat dry prior to dressing wounds Peri-Wound Care: AandD Ointment 2 x Per Week/30 Days Discharge  Instructions: Apply AandD Ointment to leg Peri-Wound Care: Desitin Maximum Strength Ointment 4 (oz) 2 x Per Week/30 Days Discharge Instructions: use to anchor top of wrap Prim Dressing: Aquacel Extra Hydrofiber Dressing, 2x2 (in/in) ary 2 x Per Week/30 Days Secondary Dressing: Zetuvit Plus 4x4 (in/in) 2 x Per Week/30 Days Compression Wrap: Urgo K2 Lite, two layer compression system, large 2 x Per Week/30 Days Electronic Signature(s) Signed: 12/31/2022 4:35:02 PM By: Angelina Pih Signed: 01/11/2023 3:00:23 PM By: Matthew Derry PA-C Entered By: Angelina Pih on 12/31/2022  12:47:43 -------------------------------------------------------------------------------- Problem List Details Patient Name: Date of Service: Matthew Burly [name] 12/31/2022 12:00 PM Medical Record Number: [id number] Patient Account Number: [id number] Date of Birth/Sex: Treating RN: [birth year] (67 y.o. [name] Primary Care Provider: [name] Other Clinician: Referring Provider: Treating Provider/Extender: [name] in Treatment: 24 Active Problems ICD-10 Encounter Code Description Active Date MDM Diagnosis E11.622 Type 2 diabetes mellitus with other skin ulcer 07/16/2022 No Yes I89.0 Lymphedema, not elsewhere classified 07/16/2022 No Yes [name], [name] ([id number]) [id number] Page 5 of 8 [phone] Non-pressure chronic ulcer of other part of left lower leg with fat layer exposed2/06/2022 No Yes L97.812 Non-pressure chronic ulcer of other part of right lower leg with fat layer 07/16/2022 No Yes exposed I10 Essential (primary) hypertension 07/16/2022 No Yes I50.42 Chronic combined systolic (congestive) and diastolic (congestive) heart failure 07/16/2022 No Yes I49.9 Cardiac arrhythmia, unspecified 07/16/2022 No Yes Inactive Problems Resolved Problems Electronic Signature(s) Signed: 12/31/2022 12:31:41 PM By: Matthew Derry PA-C Entered By: [name] on 12/31/2022 12:31:40 -------------------------------------------------------------------------------- Progress Note Details Patient Name: Date of Service: [name], Matthew [name] 12/31/2022 12:00 PM Medical Record Number: [id number] Patient Account Number: [id number] Date of Birth/Sex: Treating RN: 07/12/1955 (67 y.o. [name] Primary Care Provider: [name] Other Clinician: Referring Provider: Treating Provider/Extender: [name] in Treatment: 24 Subjective Chief Complaint Information obtained from Patient Bilateral LE ulcers and  lymphedema History of Present Illness (HPI) 07-16-2022 upon evaluation today patient appears to be doing poorly currently in regard to his his legs. The left leg is actually worse than the right. This is a patient that is previously been seen in the [place] office and at that point was doing really quite well with compression wrapping and often alginate are either Hydrofera Blue dressings. Fortunately he has gone since November 2020 until just current with out having any issue or need for wound care services which is great news. Unfortunately he is here today because he is having some issues at this point. Patient does have a history of several medical conditions which include diabetes mellitus type 2, lymphedema, hypertension, congestive heart failure, and cardiac arrhythmia though is not sure that this is actually atrial fibrillation based on what he has been told. He does have lymphedema pumps but tells me he has not used them since the summer when he had to move to a remodel of his building and subsequently never unpacked them. 07-23-2022 upon evaluation today patient appears to be doing well with regard to his legs. He is going require some sharp debridement today but overall seems to be making some progress. I am pleased in that regard he has much less drainage that he had did last week I think the compression wraps are definitely helping. 07-30-2022 upon evaluation today patient appears to be doing poorly in regard to his legs he actually has blue-green drainage which I think is consistent with Pseudomonas. I believe that we need  to address this as soon as possible and I discussed that with the patient today as well. Fortunately I do not see any ALDER, MURRI ([id number]) [id number] Page 6 of 8 signs of active infection locally nor systemically at this time which is great news. No fevers, chills, nausea, vomiting, or diarrhea. 08-13-2022 upon evaluation today  patient appears to be doing well currently in regard to his leg ulcers which are actually looking much better. Fortunately there does not appear to be any signs of active infection at this time which is great news. No fevers, chills, nausea, vomiting, or diarrhea. 3/7; patient with predominantly a left medial lower extremity wound as well as several smaller areas and a small area on the right lateral. His [name] came in today. We are using silver alginate as the primary dressing 09-03-2022 upon evaluation today patient appears to be doing well currently in regard to his wounds. He has been tolerating the dressing changes without complication. Fortunately there does not appear to be any signs of infection there is needed however for sharp debridement. 09-10-2022 upon evaluation today patient appears to be doing well currently in regard to his wound. Has been tolerating the dressing changes without complication. Fortunately there does not appear to be any signs of infection looking systemically which is great news and overall I am extremely pleased with where we stand today. I do not see any signs of infection. 09-17-2022 upon evaluation today patient actually is making signs of improvement the good news is he is making good improvement. With that being said he still is having quite a time keeping the swelling down. We have been using Tubigrip I think that still probably our best option but nonetheless I think that we are making progress with regard to his wounds. 09-24-2022 upon evaluation today patient appears to be doing well currently in regard to his wounds is continuing to make improvements at this point which is great news and overall I am extremely pleased with where we stand today. Fortunately I do not see any evidence of active infection at this time which is great news and in general I think he is doing quite well with the [facility] and silver alginate. 10-01-2022 upon evaluation today patient  appears to be doing pretty well currently in regard to his wounds. He has been tolerating the dressing changes without complication. Fortunately I do not see any evidence of active infection locally nor systemically which is great news and I do believe that 10-08-2022 upon evaluation today patient's wounds actually are showing some signs of improvement which is great news. Still this is very slow to heal I really feel like he would do better if we can get him in some stronger compression wrapping he does have the juxta lite compression wraps therefore we can actually see about doing that over top of his Tubigrip to see if that can be beneficial. He does not love the hide he had as they are not the most comfortable thing for him but nonetheless I think it would be beneficial he is in agreement with going forward with this. 10-15-2022 upon evaluation today patient appears to be doing well currently in regard to his wounds that do not appear to be infected which is good news. With that being said unfortunately he still continues to have significant amount of swelling we really need to do something to try to get the swelling under control. I do not see any signs of infection locally nor systemically but at  the same time the amount of edema is tremendous. The juxta lite helps but again he is only taking the fluid pills once or twice a week due to the amount that makes him go to the restroom. Therefore he does not take this daily which is really what he needs on a more regular basis. We need to try to get some compression on and is a little bit stronger. 10-22-2022 upon evaluation today patient actually appears to be making some really good progress in regard to his wound. The compression wrap was put on last time actually doing a great job he looks much improved and in general I feel like that we are making great progress. I do not see any evidence of active infection locally nor systemically which is great  news. 10-29-2022 upon evaluation today patient appears to be doing decently well in regard to his legs currently. I feel like that his swelling is much better I feel like that he is also doing much better in regard to the wounds in general. I do not see any signs of active infection which is great news and in general I think that he is making excellent progress towards complete resolution. The patient does seem to have 1 issue that is with his wraps actually slipping down working I definitely need to work on that aspect. 11-05-2022 upon evaluation today patient appears to be doing excellent in regard to his wound. He has been tolerating the dressing changes without complication. He actually seems to be making excellent progress I am extremely pleased with where things stand currently. I do believe that we are really making good progress and I think that we are on the right track towards complete closure I think the compression wrap has been a dramatic shift in a good way for him as far as getting the wounds healed a lot of these areas that we will continue to leave Completely sealed up. 11-12-2022 upon evaluation today patient appears to be doing well currently in regard to his leg ulcer. The compression wrapping seems to be doing an excellent job. Fortunately I do not see any evidence of active infection locally nor systemically which is great news and in general I do believe that we are making good headway here towards complete closure. 11-19-2022 upon evaluation today patient appears to be doing well currently in regard to his wounds. He is actually showing signs of excellent improvement and very pleased with where we stand I think that he is making great progress. I do not see any evidence of infection at this time which is great news. 11-26-2022 upon evaluation today patient appears to be doing well currently in regard to his wound. He is actually been tolerating the dressing changes  without complication. Fortunately I do not see any evidence of active infection locally nor systemically which is great news I think he is making excellent progress here towards closure. 12-03-2022 upon evaluation today patient appears to be doing well currently in regard to his wound. He in fact at both locations is doing great and seems to be making excellent progress and very pleased in that regard. I do not see any signs of active infection at this time. 12-10-2022 upon evaluation patient's wound is actually significantly smaller with one of the satellite lesions closed and there is just 1 small area remaining and this is actually doing quite well. I am very pleased with her progress. 12-15-2022 upon evaluation today patient appears to be doing actually pretty well  in regard to his wound on the left leg. He does have a little area on the backside that open that was previously close last week due to the fact that his wrap got wet over the weekend and he had to make do with stuff they can find at the drugstore. They made this work out however and the good news is he actually seems to be doing significantly better and the wound looks fine except for the fact that he has a couple of areas that just reopen on the backside very tiny. Nonetheless I do believe that we can go ahead and see what we do about trying to get this moving in the right direction yet again. 12-22-2022 upon evaluation today patient appears to be doing poorly in regard to his wrap slid down and his leg is extremely swollen. It is also painful secondary to it being so swollen. Fortunately I do not see any evidence of infection right now but at the same time I do believe that if we do not get the swelling under control is not really good to alleviate his discomfort. We definitely can need to change his wrap out however in short order. 12-31-2022 upon evaluation today patient appears to be doing well currently in regard to his wound. He has  been tolerating the dressing changes without complication. This actually looks to be doing much better today compared to where we were. Fortunately I do not see any evidence of active infection locally or systemically which is great news. [name], [name] ([id number]) [id number] Page 7 of 8 Constitutional Well-nourished and well-hydrated in no acute distress. Vitals Time Taken: 12:05 PM, Height: 73 in, Weight: 358 lbs, BMI: 47.2, Temperature: 98 F, Pulse: 63 bpm, Respiratory Rate: 18 breaths/min, Blood Pressure: 107/71 mmHg. Respiratory normal breathing without difficulty. Psychiatric this patient is able to make decisions and demonstrates good insight into disease process. Alert and Oriented x 3. pleasant and cooperative. General Notes: Upon inspection patient's wound bed actually showed signs of good granulation epithelization at this point. Fortunately there does not appear to be any evidence of active infection and his wrap stayed up nicely and looks to be doing much better. I am very pleased with where we are today. Integumentary (Hair, Skin) Wound #2 status is Open. Original cause of wound was Gradually Appeared. The date acquired was: 06/15/2022. The wound has been in treatment 24 weeks. The wound is located on the Left,Medial Lower Leg. The wound measures 2.2cm length x 3.2cm width x 0.1cm depth; 5.529cm^2 area and 0.553cm^3 volume. There is Fat Layer (Subcutaneous Tissue) exposed. There is no tunneling or undermining noted. There is a medium amount of serosanguineous drainage noted. There is large (67-100%) pink, pale granulation within the wound bed. There is a small (1-33%) amount of necrotic tissue within the wound bed. 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 There was a Double Layer Compression Therapy Procedure by Angelina Pih, RN. Post procedure Diagnosis Wound #2: Same as Pre-Procedure Plan 1. I would recommend that we have the patient continue to monitor benefits of  infection or worsening. Based on what I see I do believe that we are making for any good progress with regard to healing and I am very pleased at this point. 2. I would recommend as well the patient continues to utilize the plain alginate dressing which I think is appropriate currently. 3. I am also can recommend that we continue with the compression wrap this seems to really be doing a good job at this point and overall I think it sweats movement of the right direction here. We will see patient back for reevaluation in 1 week here in the clinic. If anything worsens or changes patient will contact our office for additional recommendations. Electronic Signature(s) Signed: 12/31/2022 12:41:17 PM By: Matthew Derry PA-C Entered By: [name] on 12/31/2022 12:41:17 [name] ([id number]) [id number] Page 8 of 8 -------------------------------------------------------------------------------- SuperBill Details Patient Name: Date of Service: [name] 12/31/2022 Medical Record Number: [id number] Patient Account Number: [id number] Date of Birth/Sex: Treating RN: [birth year] (67 y.o. [name] Primary Care Provider: [name] Other Clinician: Referring Provider: Treating Provider/Extender: [name] in Treatment: 24 Diagnosis Coding ICD-10 Codes Code Description E11.622 Type 2 diabetes  mellitus with other skin ulcer I89.0 Lymphedema, not elsewhere classified L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 L97.812 Non-pressure chronic ulcer of other part of right lower leg with fat layer exposed I10 Essential (primary) hypertension I50.42 Chronic combined systolic (congestive) and diastolic (congestive) heart failure I49.9 Cardiac arrhythmia, unspecified Facility Procedures : CPT4 Code: 08657846 Description: (Facility Use Only) 29581LT - APPLY MULTLAY COMPRS LWR LT LEG Modifier: Quantity: 1 Physician Procedures : CPT4 Code Description Modifier [id number] - WC PHYS LEVEL 3 - EST PT ICD-10 Diagnosis Description E11.622 Type 2 diabetes mellitus with other skin ulcer I89.0 Lymphedema, not elsewhere classified L97.822 Non-pressure chronic ulcer of other part of  left lower leg with fat layer exposed L97.812 Non-pressure chronic ulcer of other part of right lower leg with fat layer exposed Quantity: 1 Electronic Signature(s) Signed: 12/31/2022 1:12:55 PM By: Angelina Pih Signed: 01/11/2023 3:00:23 PM By: Matthew Derry PA-C Previous Signature: 12/31/2022 12:41:31 PM Version By: Matthew Derry PA-C Entered By: Angelina Pih on 12/31/2022 13:12:55

## 2022-12-31 NOTE — Progress Notes (Signed)
LEONIDAS, BOATENG ([id number]) [id number] Page 1 of 9 Visit Report for 12/31/2022 Arrival Information Details Patient Name: Date of Service: [name] 12/31/2022 12:00 PM Medical Record Number: [id number] Patient Account Number: [id number] Date of Birth/Sex: Treating RN: [birth year] (67 y.o. [name] Primary Care Nayla Dias: [name] Other Clinician: Referring Jimmy Plessinger: Treating Ruvim Risko/Extender: Matthew Folks in Treatment: 24 Visit Information History Since Last Visit Added or deleted any medications: No Patient Arrived: [name] Any new allergies or adverse reactions: No Arrival Time: 12:05 Had a fall or experienced change in No Accompanied By: self activities of daily living that may affect Transfer Assistance: None risk of falls: Patient Identification Verified: Yes Hospitalized since last visit: No Secondary Verification Process Completed: Yes Has Dressing in Place as Prescribed: Yes Patient Requires Transmission-Based Precautions: No Has Compression in Place as Prescribed: Yes Patient Has Alerts: No Pain Present Now: No Electronic Signature(s) Signed: 12/31/2022 4:35:02 PM By: Angelina Pih Entered By: Angelina Pih on 12/31/2022 12:05:46 -------------------------------------------------------------------------------- Clinic Level of Care Assessment Details Patient Name: Date of Service: [name] 12/31/2022 12:00 PM Medical Record Number: [id number] Patient Account Number: [id number] Date of Birth/Sex: Treating RN: 09/08/1955 (67 y.o. [name] Primary Care Braylea Brancato: [name] Other Clinician: Referring Pluma Diniz: Treating Cayci Mcnabb/Extender: Matthew Folks in Treatment: 24 Clinic Level of Care Assessment Items TOOL 1 Quantity Score []  - 0 Use when EandM and Procedure is performed on INITIAL visit ASSESSMENTS - Nursing Assessment / Reassessment []  - 0 General  Physical Exam (combine w/ comprehensive assessment (listed just below) when performed on new pt. evals) []  - 0 Comprehensive Assessment (HX, ROS, Risk Assessments, Wounds Hx, etc.) ASSESSMENTS - Wound and Skin Assessment / Reassessment []  - 0 Dermatologic / Skin Assessment (not related to wound area) [name], [name] ([id number]) [id number] Page 2 of 9 ASSESSMENTS - Ostomy and/or Continence Assessment and Care []  - 0 Incontinence Assessment and Management []  - 0 Ostomy Care Assessment and Management (repouching, etc.) PROCESS - Coordination of Care []  - 0 Simple Patient / Family Education for ongoing care []  - 0 Complex (extensive) Patient / Family Education for ongoing care []  - 0 Staff obtains Chiropractor, Records, T Results / Process Orders est []  - 0 Staff telephones HHA, Nursing Homes / Clarify orders / etc []  - 0 Routine Transfer to another Facility (non-emergent condition) []  - 0 Routine Hospital Admission (non-emergent condition) []  - 0 New Admissions / Manufacturing engineer / Ordering NPWT Apligraf, etc. , []  - 0 Emergency Hospital Admission (emergent condition) PROCESS - Special Needs []  - 0 Pediatric / Minor Patient Management []  - 0 Isolation Patient Management []  - 0 Hearing / Language / Visual special needs []  - 0 Assessment of Community assistance (transportation, D/C planning, etc.) []  - 0 Additional assistance / Altered mentation []  - 0 Support Surface(s) Assessment (bed, cushion, seat, etc.) INTERVENTIONS - Miscellaneous []  - 0 External ear exam []  - 0 Patient Transfer (multiple staff / Nurse, adult / Similar devices) []  - 0 Simple Staple / Suture removal (25 or less) []  - 0 Complex Staple / Suture removal (26 or more) []  - 0 Hypo/Hyperglycemic Management (do not check if billed separately) []  - 0 Ankle / Brachial Index (ABI) - do not check if billed separately Has the patient been seen at the hospital within the last  three years: Yes Total Score: 0 Level Of Care: ____ Electronic Signature(s) Signed: 12/31/2022 4:35:02 PM By: Angelina Pih Entered By:  Angelina Pih on 12/31/2022 13:12:44 -------------------------------------------------------------------------------- Compression Therapy Details Patient Name: Date of Service: [name] 12/31/2022 12:00 PM Medical Record Number: [id number] Patient Account Number: [id number] Date of Birth/Sex: Treating RN: 1955/07/29 (67 y.o. [name] Primary Care Yamato Kopf: [name] Other Clinician: Referring Analise Glotfelty: Treating [place] Decoteau/Extender: Matthew Folks in Treatment: 24 Compression Therapy Performed for Wound Assessment: Wound #2 Left,Medial Lower Leg Performed By: Holly Bodily, RN Compression Type: Double Shelby Dubin ([id number]) [id number] Page 3 of 9 Post Procedure Diagnosis Same as Pre-procedure Electronic Signature(s) Signed: 12/31/2022 4:35:02 PM By: Angelina Pih Entered By: Angelina Pih on 12/31/2022 12:33:05 -------------------------------------------------------------------------------- Encounter Discharge Information Details Patient Name: Date of Service: [name], Matthew [name] 12/31/2022 12:00 PM Medical Record Number: [id number] Patient Account Number: [id number] Date of Birth/Sex: Treating RN: Mar 26, 1956 (67 y.o. [name] Primary Care Maleki Hippe: [name] Other Clinician: Referring [place] Silveria: Treating Imani Fiebelkorn/Extender: Matthew Folks in Treatment: 24 Encounter Discharge Information Items Discharge Condition: Stable Ambulatory Status: Cane Discharge Destination: Home Transportation: Private Auto Accompanied By: self Schedule Follow-up Appointment: Yes Clinical Summary of Care: Electronic Signature(s) Signed: 12/31/2022 1:14:11 PM By: Angelina Pih Entered By: Angelina Pih on 12/31/2022  13:14:11 -------------------------------------------------------------------------------- Lower Extremity Assessment Details Patient Name: Date of Service: [name] 12/31/2022 12:00 PM Medical Record Number: [id number] Patient Account Number: [id number] Date of Birth/Sex: Treating RN: 08-Sep-1955 (67 y.o. [name] Primary Care Dezirea Mccollister: [name] Other Clinician: Referring Alhassan Everingham: Treating Ruthel Martine/Extender: Matthew Folks in Treatment: 24 Edema Assessment Assessed: [Left: No] [Right: No] Edema: [Left: Ye] [Right: s] Calf [name], [name] ([id number]) [id number] Page 4 of 9 Left: Right: Point of Measurement: 35 cm From Medial Instep 59.2 cm Ankle Left: Right: Point of Measurement: 10 cm From Medial Instep 30.5 cm Vascular Assessment Pulses: Dorsalis Pedis Palpable: [Left:Yes] Electronic Signature(s) Signed: 12/31/2022 4:35:02 PM By: Angelina Pih Entered By: Angelina Pih on 12/31/2022 12:13:48 -------------------------------------------------------------------------------- Multi Wound Chart Details Patient Name: Date of Service: Matthew Burly [name] 12/31/2022 12:00 PM Medical Record Number: [id number] Patient Account Number: [id number] Date of Birth/Sex: Treating RN: 1955/09/30 (67 y.o. [name] Primary Care Maude Gloor: [name] Other Clinician: Referring Megann Easterwood: Treating Keelyn Fjelstad/Extender: Matthew Folks in Treatment: 24 Vital Signs Height(in): 73 Pulse(bpm): 63 Weight(lbs): 358 Blood Pressure(mmHg): 107/71 Body Mass Index(BMI): 47.2 Temperature(F): 98 Respiratory Rate(breaths/min): 18 [2:Photos:] [N/A:N/A] Left, Medial Lower Leg N/A N/A Wound Location: Gradually Appeared N/A N/A Wounding Event: Venous Leg Ulcer N/A N/A Primary Etiology: Diabetic Wound/Ulcer of the Lower N/A N/A Secondary Etiology: Extremity Arrhythmia, Congestive Heart Failure, N/A  N/A Comorbid History: Hypertension, Type II Diabetes 06/15/2022 N/A N/A Date Acquired: 24 N/A N/A Weeks of Treatment: Open N/A N/A Wound Status: No N/A N/A Wound Recurrence: 2.2x3.2x0.1 N/A N/A Measurements L x W x D (cm) 5.529 N/A N/A A (cm) : rea 0.553 N/A N/A Volume (cm) : 99.10% N/A N/A % Reduction in Area: 99.10% N/A N/A % Reduction in Volume: Full Thickness Without Exposed N/A N/A Classification: [name], [name] ([id number]) [id number] Page 5 of 9 Support Structures Medium N/A N/A Exudate Amount: Serosanguineous N/A N/A Exudate Type: red, brown N/A N/A Exudate Color: Large (67-100%) N/A N/A Granulation Amount: Pink, Pale N/A N/A Granulation Quality: Small (1-33%) N/A N/A Necrotic Amount: Fat Layer (Subcutaneous Tissue): Yes N/A N/A Exposed Structures: Fascia: No Tendon: No Muscle: No Joint: No Bone: No None N/A N/A Epithelialization: Treatment Notes Electronic Signature(s) Signed: 12/31/2022 4:35:02  PM By: Angelina Pih Entered By: Angelina Pih on 12/31/2022 12:32:16 -------------------------------------------------------------------------------- Multi-Disciplinary Care Plan Details Patient Name: Date of Service: [name], Matthew [name] 12/31/2022 12:00 PM Medical Record Number: [id number] Patient Account Number: [id number] Date of Birth/Sex: Treating RN: 1956-06-15 (67 y.o. [name] Primary Care Akera Snowberger: [name] Other Clinician: Referring Libi Corso: Treating Saunders [place]/Extender: Matthew Folks in Treatment: 24 Active Inactive Venous Leg Ulcer Nursing Diagnoses: Actual venous Insuffiency (use after diagnosis is confirmed) Goals: Patient will maintain optimal edema control Date Initiated: 10/15/2022 Target Resolution Date: 01/12/2023 Goal Status: Active Patient/caregiver will verbalize understanding of disease process and disease management Date Initiated: 10/15/2022 Date Inactivated:  11/12/2022 Target Resolution Date: 10/15/2022 Goal Status: Met Verify adequate tissue perfusion prior to therapeutic compression application Date Initiated: 10/15/2022 Date Inactivated: 11/12/2022 Target Resolution Date: 10/15/2022 Goal Status: Met Interventions: Assess peripheral edema status every visit. Compression as ordered Provide education on venous insufficiency Treatment Activities: Therapeutic compression applied : 10/15/2022 Notes: Electronic Signature(s) Signed: 12/31/2022 1:13:04 PM By: Carmela Rima ([id number]) PM By: Angelina Pih [id number] Page 6 of 9 Signed: 12/31/2022 1:13:04 Entered By: Angelina Pih on 12/31/2022 13:13:04 -------------------------------------------------------------------------------- Pain Assessment Details Patient Name: Date of Service: [name] 12/31/2022 12:00 PM Medical Record Number: [id number] Patient Account Number: [id number] Date of Birth/Sex: Treating RN: 07-23-1955 (67 y.o. [name] Primary Care Felissa Blouch: [name] Other Clinician: Referring Ahmiya Abee: Treating Angelena Sand/Extender: Matthew Folks in Treatment: 24 Active Problems Location of Pain Severity and Description of Pain Patient Has Paino No Site Locations Rate the pain. Current Pain Level: 0 Pain Management and Medication Current Pain Management: Electronic Signature(s) Signed: 12/31/2022 4:35:02 PM By: Angelina Pih Entered By: Angelina Pih on 12/31/2022 12:08:56 -------------------------------------------------------------------------------- Patient/Caregiver Education Details Patient Name: Date of Service: [name] 7/18/2024andnbsp12:00 PM Medical Record Number: [id number] Patient Account Number: [id number] Date of Birth/Gender: Treating RN: 15-Feb-1956 (67 y.o. Lindel, Marcell St. Masiyah ([id number]) [id number] Page 7 of 9 Primary  Care Physician: [name] Other Clinician: Referring Physician: Treating Physician/Extender: Matthew Folks in Treatment: 24 Education Assessment Education Provided To: Patient Education Topics Provided Wound/Skin Impairment: Handouts: Caring for Your Ulcer Methods: Explain/Verbal Responses: State content correctly Electronic Signature(s) Signed: 12/31/2022 4:35:02 PM By: Angelina Pih Entered By: Angelina Pih on 12/31/2022 13:13:15 -------------------------------------------------------------------------------- Wound Assessment Details Patient Name: Date of Service: Matthew Burly [name] 12/31/2022 12:00 PM Medical Record Number: [id number] Patient Account Number: [id number] Date of Birth/Sex: Treating RN: 06-11-1956 (67 y.o. [name] Primary Care Shontel Santee: [name] Other Clinician: Referring Siani Utke: Treating Merideth Bosque/Extender: Matthew Folks in Treatment: 24 Wound Status Wound Number: 2 Primary Etiology: Venous Leg Ulcer Wound Location: Left, Medial Lower Leg Secondary Diabetic Wound/Ulcer of the Lower Extremity Etiology: Wounding Event: Gradually Appeared Wound Status: Open Date Acquired: 06/15/2022 Comorbid Arrhythmia, Congestive Heart Failure, Hypertension, Type Weeks Of Treatment: 24 History: II Diabetes Clustered Wound: No Photos Wound Measurements Length: (cm) 2.2 Width: (cm) 3.2 Depth: (cm) 0.1 Area: (cm) 5. Volume: (cm) 0. [name], [name] ([id number]) Wound Description Classification: Full Thickness Without Exposed Supp Exudate Amount: Medium Exudate Type: Serosanguineous Exudate Color: red, brown ort Structures Foul Odor After Cleansing: Slough/Fibrino % Reduction in Area: 99.1% % Reduction in Volume: 99.1% Epithelialization: None 529 Tunneling: No 553 Undermining: No [id number] Page 8 of 9 No Yes Wound Bed Granulation Amount: Large (67-100%) Exposed  Structure Granulation Quality: Pink, Pale Fascia Exposed: No Necrotic Amount: Small (  1-33%) Fat Layer (Subcutaneous Tissue) Exposed: Yes Tendon Exposed: No Muscle Exposed: No Joint Exposed: No Bone Exposed: No Treatment Notes Wound #2 (Lower Leg) Wound Laterality: Left, Medial Cleanser Soap and Water Discharge Instruction: Gently cleanse wound with antibacterial soap, rinse and pat dry prior to dressing wounds Peri-Wound Care AandD Ointment Discharge Instruction: Apply AandD Ointment to leg Desitin Maximum Strength Ointment 4 (oz) Discharge Instruction: use to anchor top of wrap Topical Primary Dressing Aquacel Extra Hydrofiber Dressing, 2x2 (in/in) Secondary Dressing Zetuvit Plus 4x4 (in/in) Secured With Compression Wrap Urgo K2 Lite, two layer compression system, large Compression Stockings Add-Ons Electronic Signature(s) Signed: 12/31/2022 4:35:02 PM By: Angelina Pih Entered By: Angelina Pih on 12/31/2022 12:16:29 -------------------------------------------------------------------------------- Vitals Details Patient Name: Date of Service: [name], Matthew [name] 12/31/2022 12:00 PM Medical Record Number: [id number] Patient Account Number: [id number] Date of Birth/Sex: Treating RN: 11-Nov-1955 (67 y.o. [name] Primary Care Arista Kettlewell: [name] Other Clinician: Referring Velda Wendt: Treating Rosevelt Luu/Extender: Matthew Folks in Treatment: 24 Vital Signs Time Taken: 12:05 Temperature (F): 98 Height (in): 73 Pulse (bpm): 63 [name], [name] ([id number]) [id number] Page 9 of 9 Weight (lbs): 358 Respiratory Rate (breaths/min): 18 Body Mass Index (BMI): 47.2 Blood Pressure (mmHg): 107/71 Reference Range: 80 - 120 mg / dl Electronic Signature(s) Signed: 12/31/2022 4:35:02 PM By: Angelina Pih Entered By: Angelina Pih on 12/31/2022 12:08:00

## 2023-01-01 IMAGING — CT CT HEAD W/O CM
3 series · 15 of 47 positions shown, 18 images · non-contrast
Comparison: None

CLINICAL DATA: Mild generalized weakness, dizziness, near syncope
and RIGHT leg weakness since yesterday, history diabetes mellitus

EXAM:
CT HEAD WITHOUT CONTRAST
TECHNIQUE: Contiguous axial images were obtained from the base of the skull
through the vertex without intravenous contrast.

[Series 4: head 5.0 h30s · axial · 0.50mm/px · z∈[-59,+71]mm · 9 of 32 slices shown, 12 images]
[im 3/32  brain]
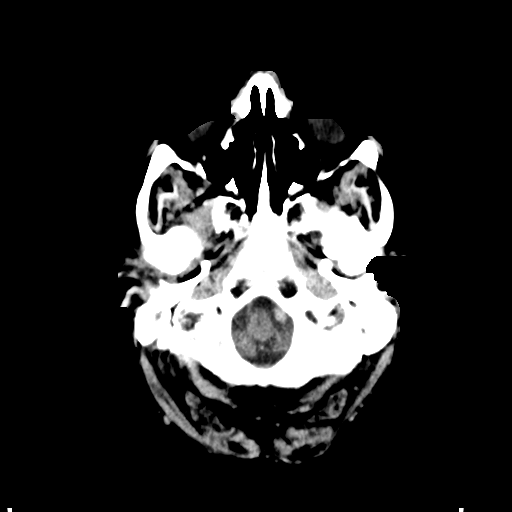
[im 3/32  bone]
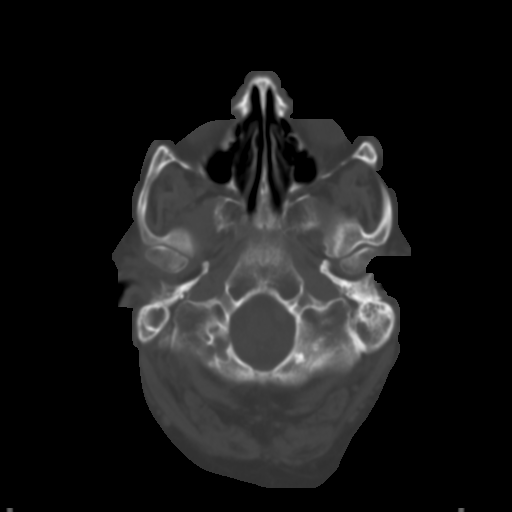
[im 6/32  brain]
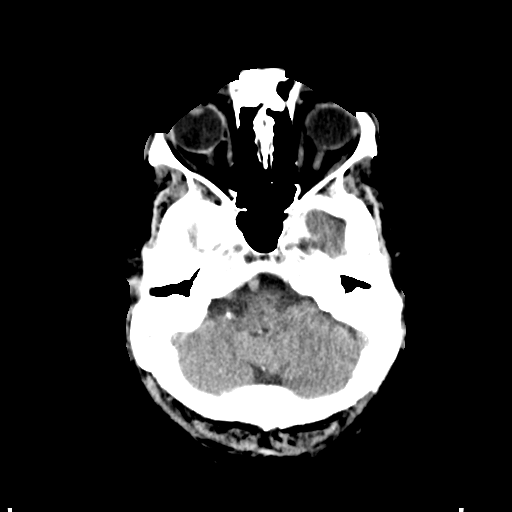
[im 9/32  brain]
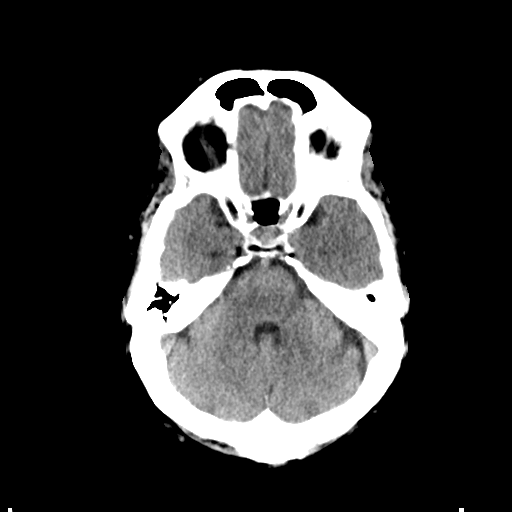
[im 12/32  brain]
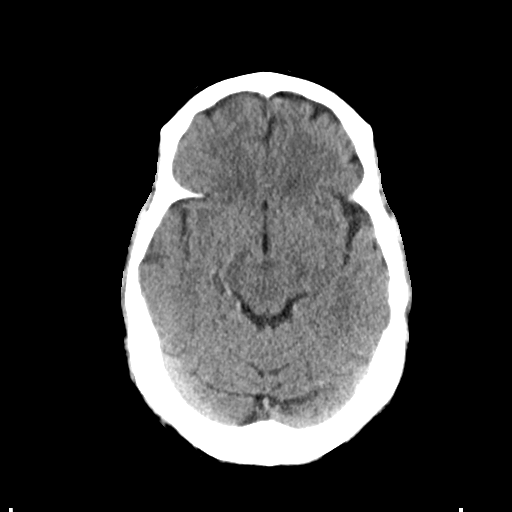
[im 17/32  brain]
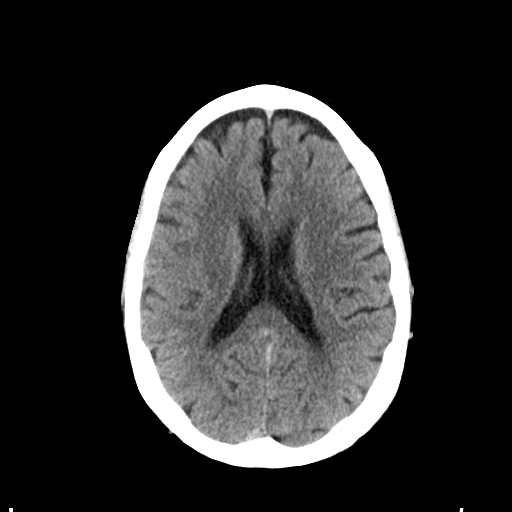
[im 17/32  bone]
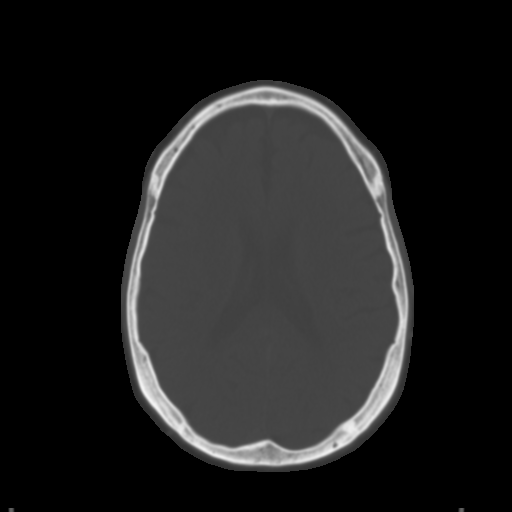
[im 20/32  brain]
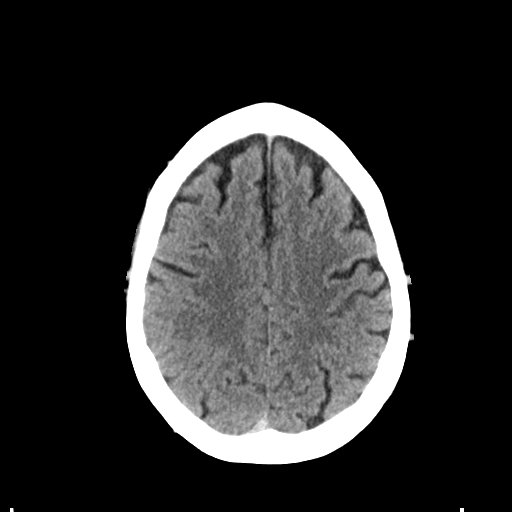
[im 23/32  brain]
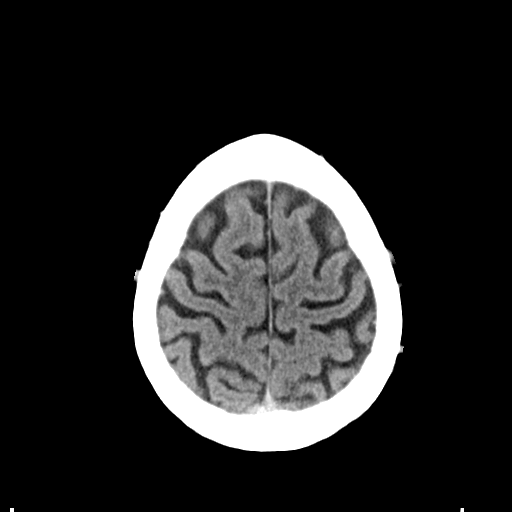
[im 26/32  brain]
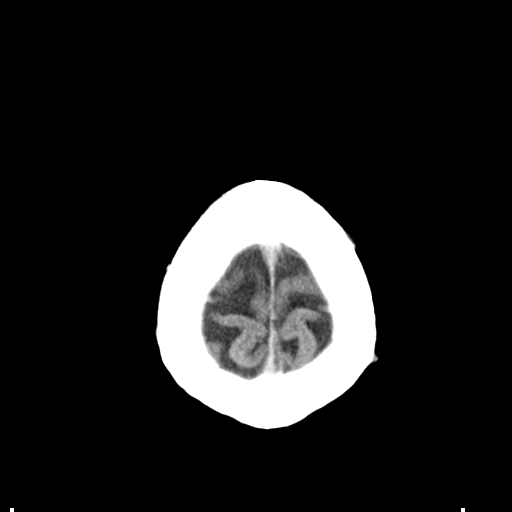
[im 29/32  brain]
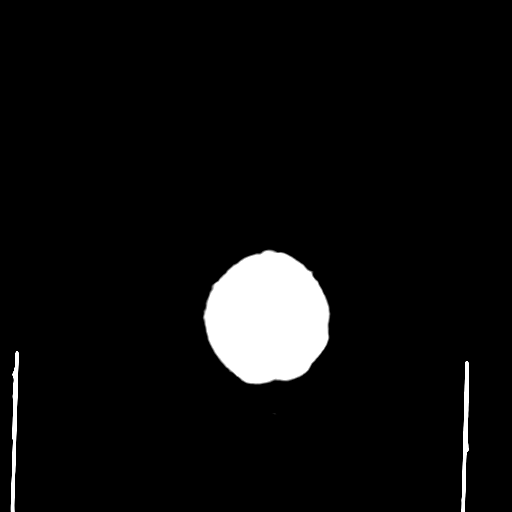
[im 29/32  bone]
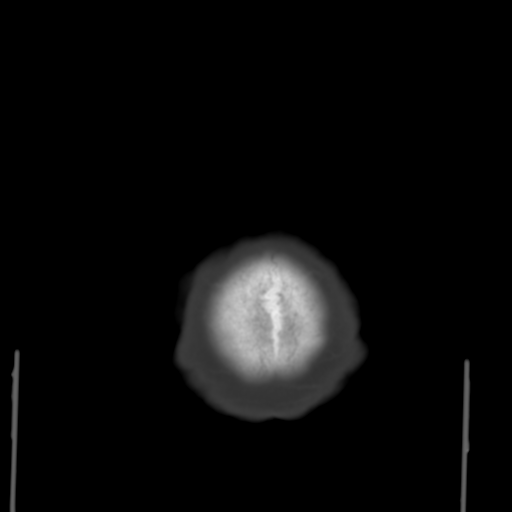

[Series 5: head 3.0 mpr cor · coronal · 0.33mm/px · 3 of 71 slices shown]
[im 24/71  brain]
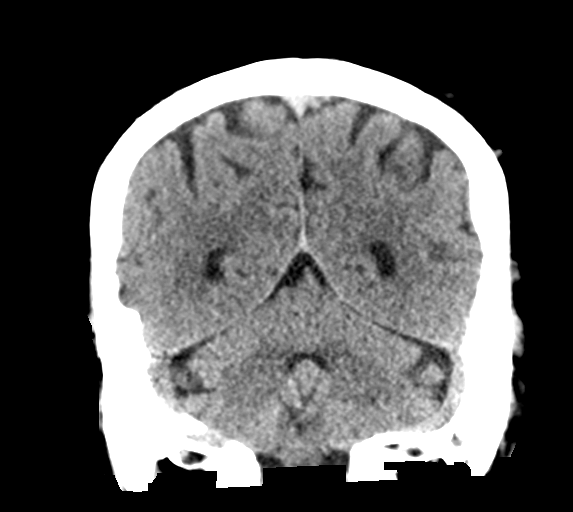
[im 32/71  brain]
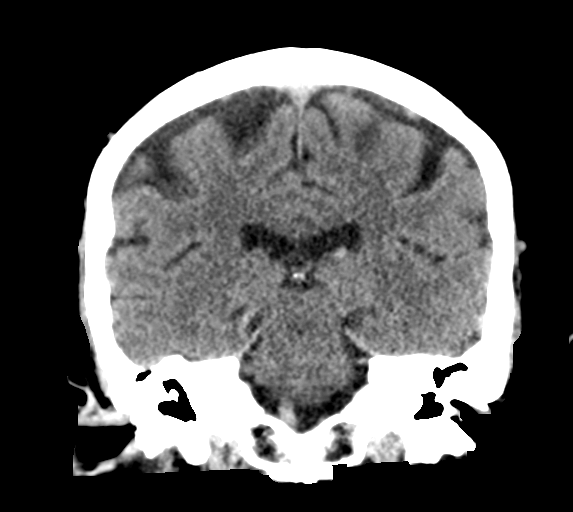
[im 39/71  brain]
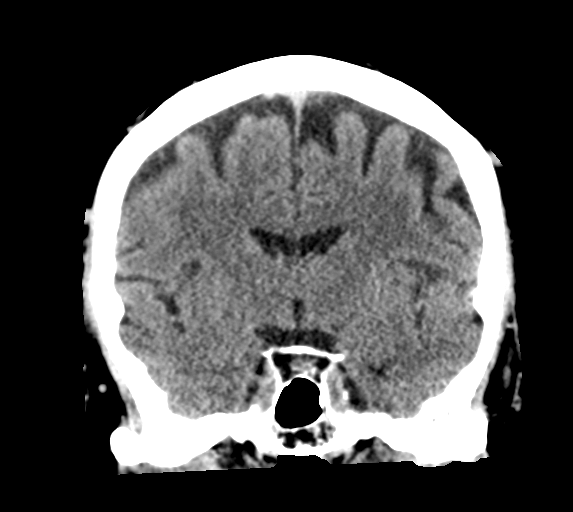

[Series 6: head 3.0 mpr sag · sagittal · 0.33mm/px · 3 of 57 slices shown]
[im 19/57  brain]
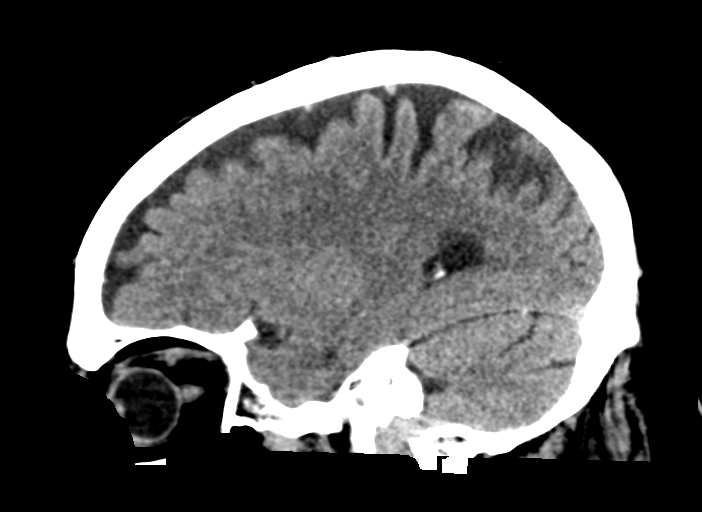
[im 29/57  brain]
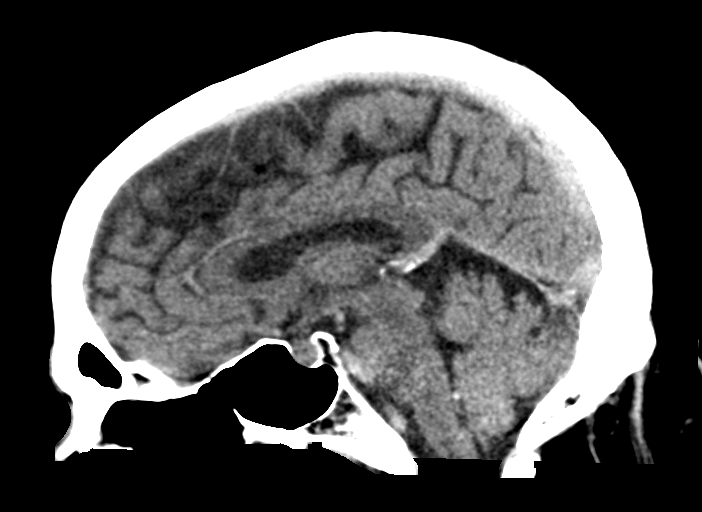
[im 38/57  brain]
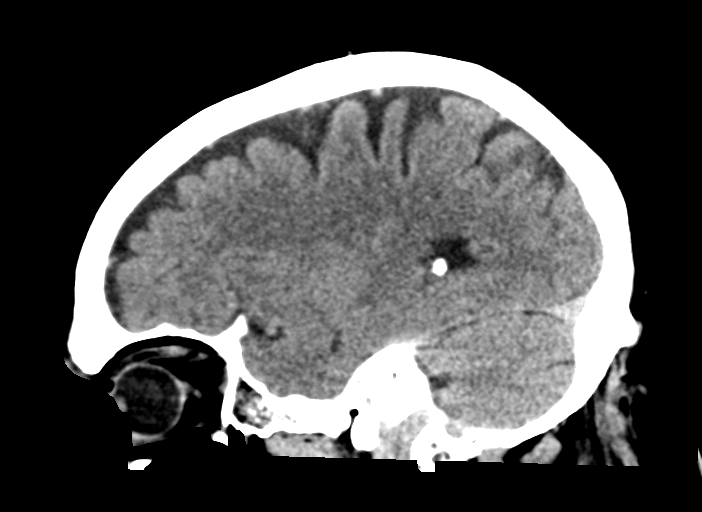

[15 of 47 positions shown; findings below may reference images not displayed]

FINDINGS: Brain: Mild atrophy. Normal ventricular morphology. No midline shift
or mass effect. Otherwise normal appearance of brain parenchyma. No
intracranial hemorrhage, mass lesion, or evidence of acute
infarction. No extra-axial fluid collections.

Vascular: No hyperdense vessels.

Skull: Demineralized but intact

Sinuses/Orbits: Opacified mastoid air cells bilaterally. Paranasal
sinuses clear.

Other: N/A
IMPRESSION: Generalized atrophy.

No acute intracranial abnormalities.

BILATERAL mastoid effusions.

## 2023-01-01 IMAGING — CR DG CHEST 2V
2 series · 2 of 2 positions shown · non-contrast
Comparison: None.

CLINICAL DATA: Weakness, lightheadedness, shortness of breath. Near
syncope.

EXAM:
CHEST - 2 VIEW

[chest lat]
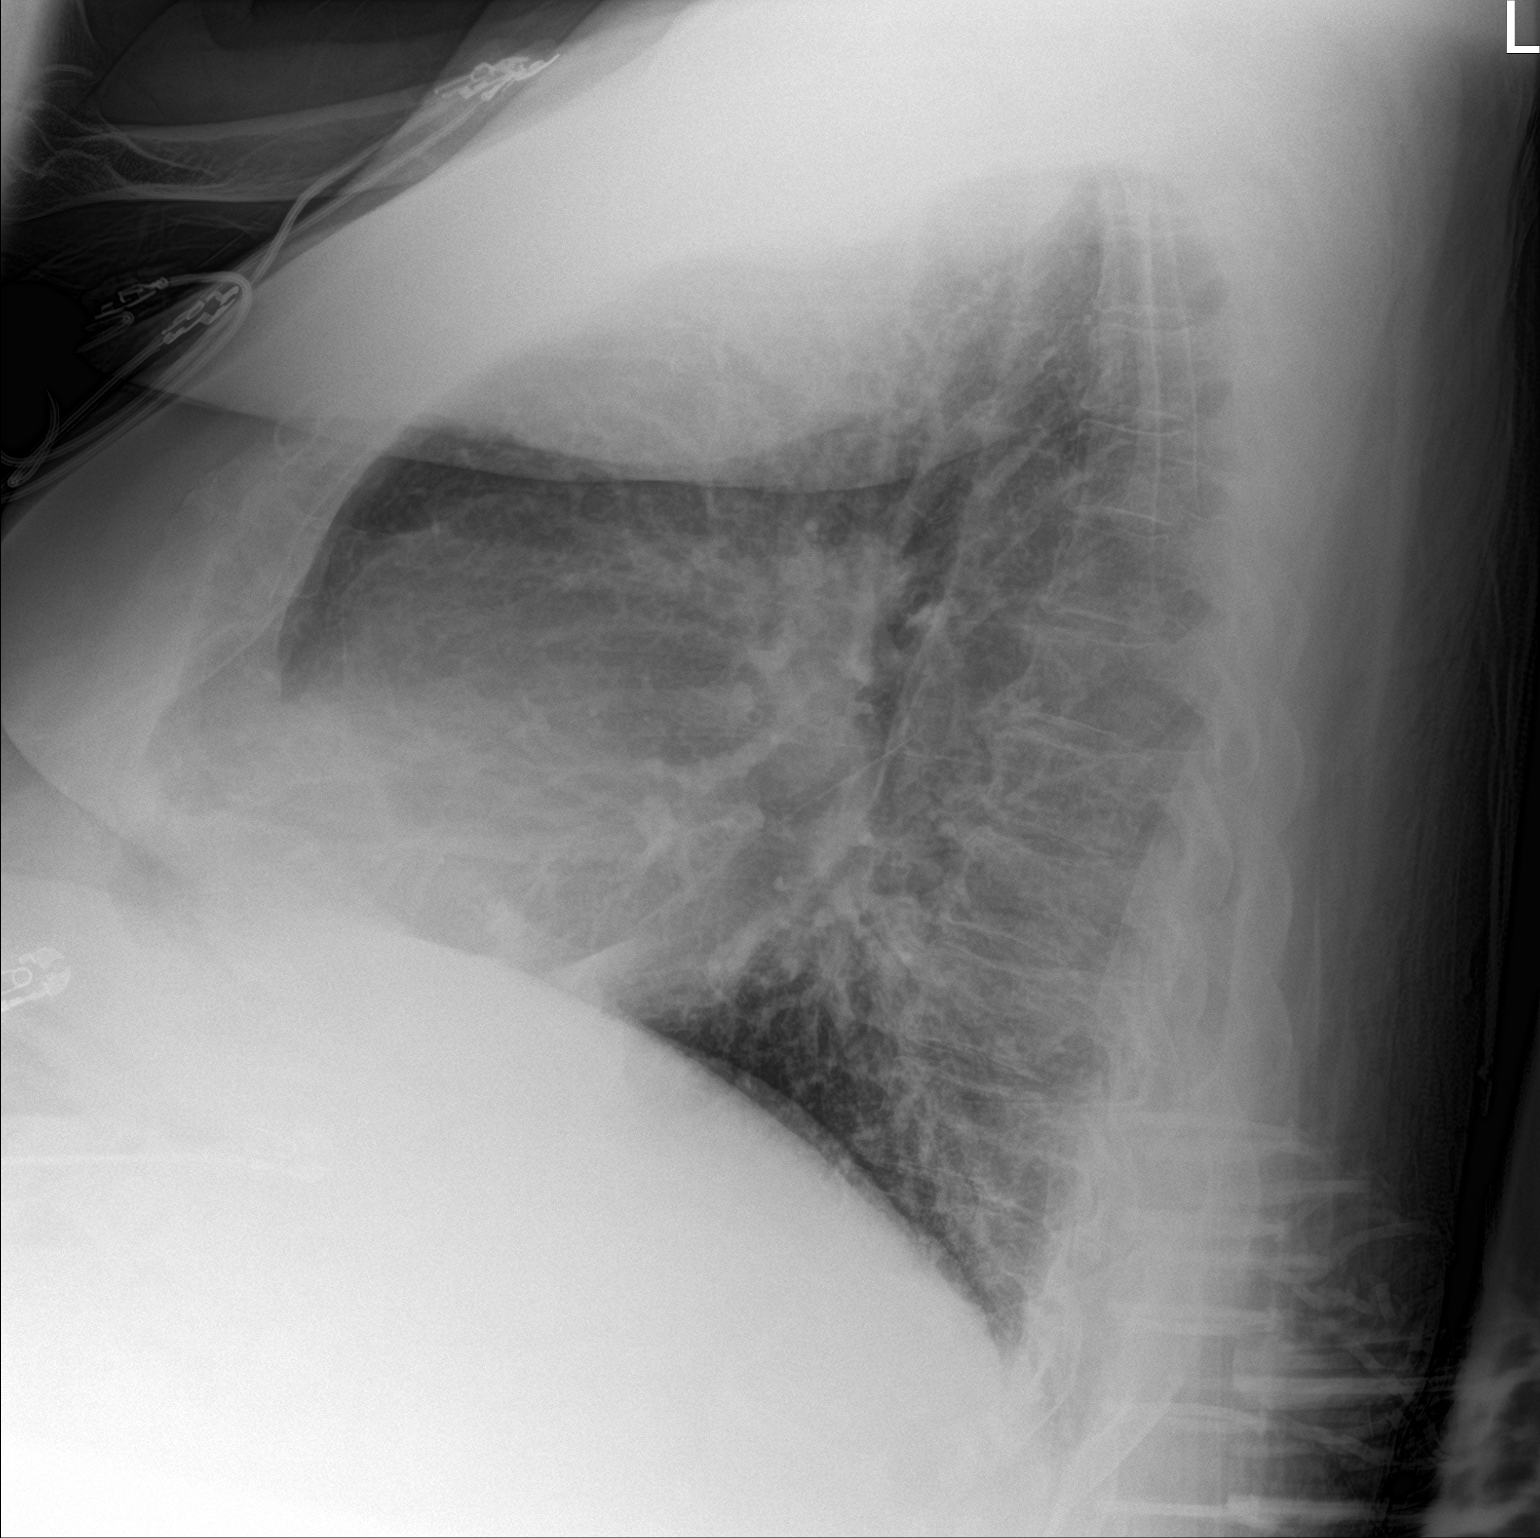

[chest ap]
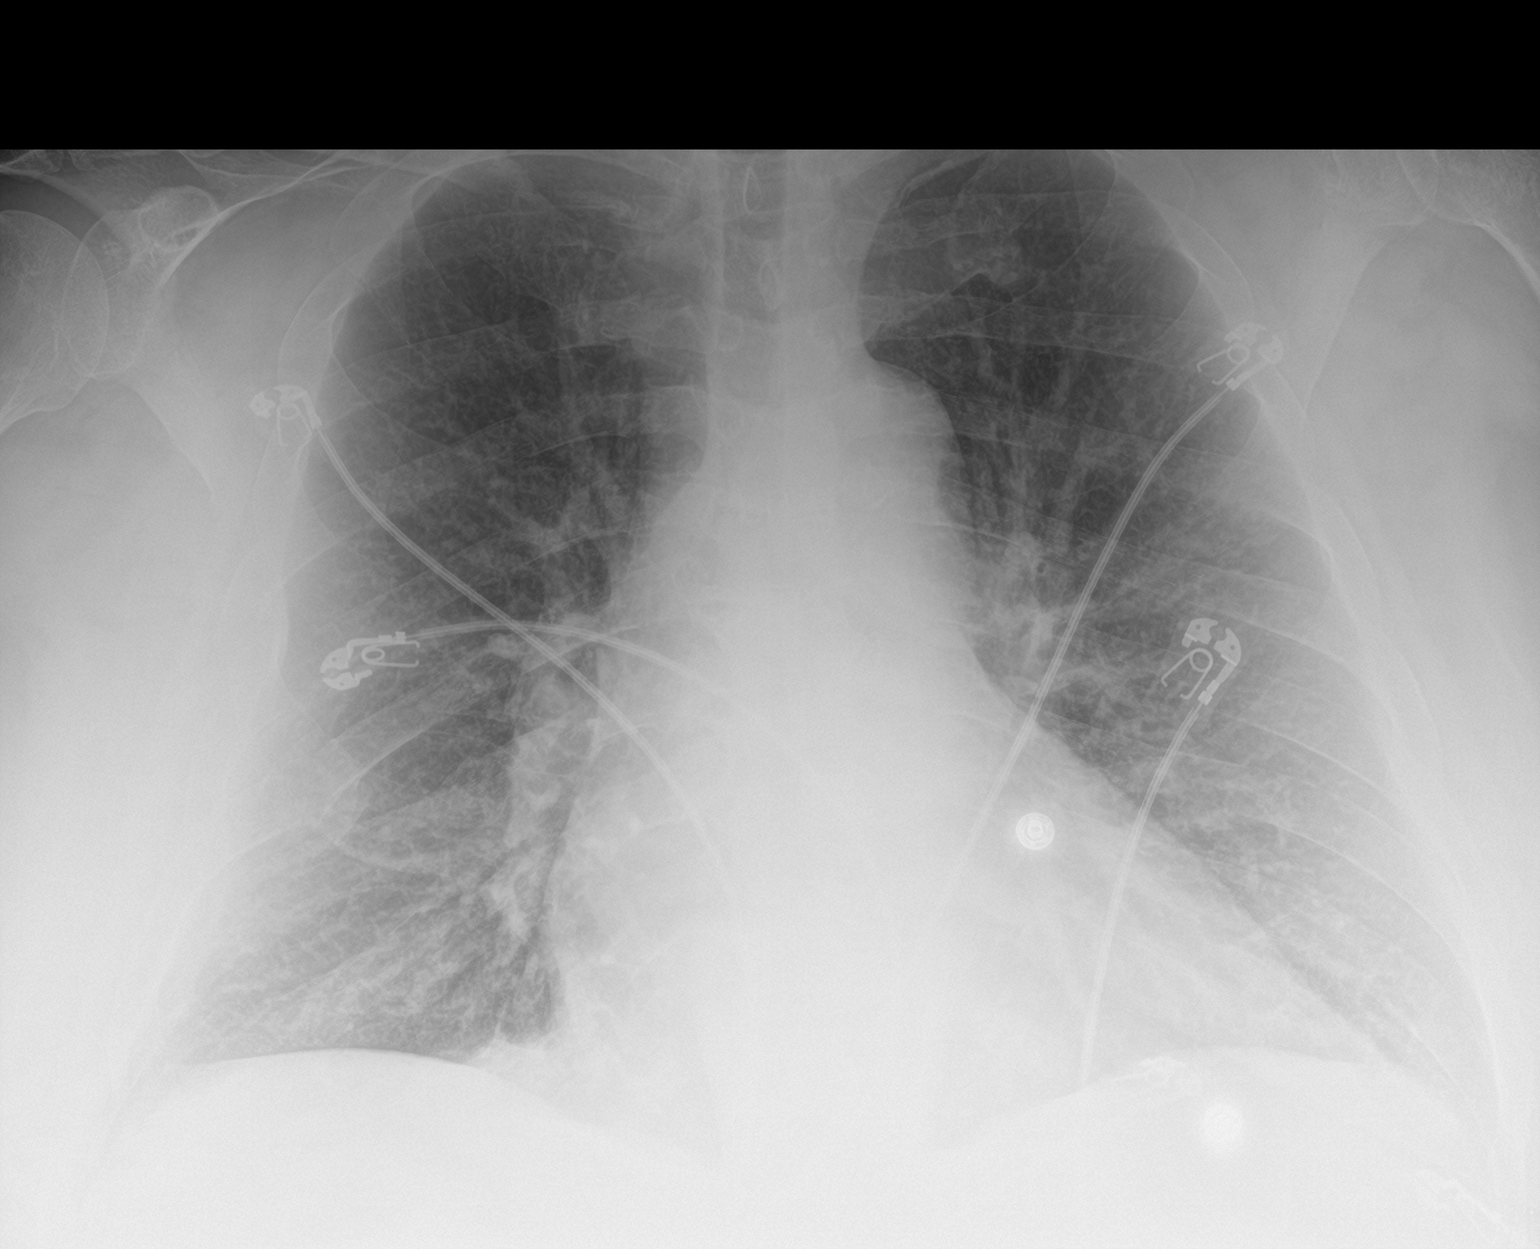

[2 of 2 positions shown; findings below may reference images not displayed]

FINDINGS: The heart is enlarged. There is peribronchial and interstitial
thickening. No significant pleural effusion. No confluent
consolidation. No pneumothorax. Remote right rib fractures. No acute
osseous abnormalities are seen.
IMPRESSION: Cardiomegaly. Peribronchial and interstitial thickening, may be
pulmonary edema or bronchitis.

## 2023-01-07 ENCOUNTER — Ambulatory Visit: Payer: PPO | Admitting: Internal Medicine

## 2023-01-11 ENCOUNTER — Encounter: Payer: PPO | Admitting: Physician Assistant

## 2023-01-11 DIAGNOSIS — L97822 Non-pressure chronic ulcer of other part of left lower leg with fat layer exposed: Secondary | ICD-10-CM | POA: Diagnosis not present

## 2023-01-11 DIAGNOSIS — I890 Lymphedema, not elsewhere classified: Secondary | ICD-10-CM | POA: Diagnosis not present

## 2023-01-11 DIAGNOSIS — E11622 Type 2 diabetes mellitus with other skin ulcer: Secondary | ICD-10-CM | POA: Diagnosis not present

## 2023-01-11 NOTE — Progress Notes (Signed)
CALEX, QUIRINDONGO ([id number]) [id number] Page 1 of 8 Visit Report for 01/11/2023 Chief Complaint Document Details Patient Name: Date of Service: [name] 01/11/2023 1:00 PM Medical Record Number: [id number] Patient Account Number: [id number] Date of Birth/Sex: Treating RN: 1956/01/30 (67 y.o. [name] Primary Care Provider: [name] Other Clinician: Referring Provider: Treating Provider/Extender: Matthew Folks in Treatment: 25 Information Obtained from: Patient Chief Complaint Bilateral LE ulcers and lymphedema Electronic Signature(s) Signed: 01/11/2023 1:08:27 PM By: Allen Derry PA-C Entered By: Allen Derry on 01/11/2023 13:08:27 -------------------------------------------------------------------------------- HPI Details Patient Name: Date of Service: [name], [name] 01/11/2023 1:00 PM Medical Record Number: [id number] Patient Account Number: [id number] Date of Birth/Sex: Treating RN: 04-30-1956 (67 y.o. [name] Primary Care Provider: [name] Other Clinician: Referring Provider: Treating Provider/Extender: Matthew Folks in Treatment: 25 History of Present Illness HPI Description: 07-16-2022 upon evaluation today patient appears to be doing poorly currently in regard to his his legs. The left leg is actually worse than the right. This is a patient that is previously been seen in the [place] office and at that point was doing really quite well with compression wrapping and often alginate are either Hydrofera Blue dressings. Fortunately he has gone since November 2020 until just current with out having any issue or need for wound care services which is great news. Unfortunately he is here today because he is having some issues at this point. Patient does have a history of several medical conditions which include diabetes mellitus type 2, lymphedema, hypertension, congestive heart  failure, and cardiac arrhythmia though is not sure that this is actually atrial fibrillation based on what he has been told. He does have lymphedema pumps but tells me he has not used them since the summer when he had to move to a remodel of his building and subsequently never unpacked them. 07-23-2022 upon evaluation today patient appears to be doing well with regard to his legs. He is going require some sharp debridement today but overall seems to be making some progress. I am pleased in that regard he has much less drainage that he had did last week I think the compression wraps are definitely helping. 07-30-2022 upon evaluation today patient appears to be doing poorly in regard to his legs he actually has blue-green drainage which I think is consistent with Pseudomonas. I believe that we need to address this as soon as possible and I discussed that with the patient today as well. Fortunately I do not see any signs of active infection locally nor systemically at this time which is great news. No fevers, chills, nausea, vomiting, or diarrhea. DAVEY, DEGNAN ([id number]) [id number] Page 2 of 8 08-13-2022 upon evaluation today patient appears to be doing well currently in regard to his leg ulcers which are actually looking much better. Fortunately there does not appear to be any signs of active infection at this time which is great news. No fevers, chills, nausea, vomiting, or diarrhea. 3/7; patient with predominantly a left medial lower extremity wound as well as several smaller areas and a small area on the right lateral. His Jodie Echevaria came in today. We are using silver alginate as the primary dressing 09-03-2022 upon evaluation today patient appears to be doing well currently in regard to his wounds. He has been tolerating the dressing changes without complication. Fortunately there does not appear to be any signs of infection there is needed however for sharp  debridement. 09-10-2022  upon evaluation today patient appears to be doing well currently in regard to his wound. Has been tolerating the dressing changes without complication. Fortunately there does not appear to be any signs of infection looking systemically which is great news and overall I am extremely pleased with where we stand today. I do not see any signs of infection. 09-17-2022 upon evaluation today patient actually is making signs of improvement the good news is he is making good improvement. With that being said he still is having quite a time keeping the swelling down. We have been using Tubigrip I think that still probably our best option but nonetheless I think that we are making progress with regard to his wounds. 09-24-2022 upon evaluation today patient appears to be doing well currently in regard to his wounds is continuing to make improvements at this point which is great news and overall I am extremely pleased with where we stand today. Fortunately I do not see any evidence of active infection at this time which is great news and in general I think he is doing quite well with the [facility] and silver alginate. 10-01-2022 upon evaluation today patient appears to be doing pretty well currently in regard to his wounds. He has been tolerating the dressing changes without complication. Fortunately I do not see any evidence of active infection locally nor systemically which is great news and I do believe that 10-08-2022 upon evaluation today patient's wounds actually are showing some signs of improvement which is great news. Still this is very slow to heal I really feel like he would do better if we can get him in some stronger compression wrapping he does have the juxta lite compression wraps therefore we can actually see about doing that over top of his Tubigrip to see if that can be beneficial. He does not love the hide he had as they are not the most comfortable thing for him but nonetheless  I think it would be beneficial he is in agreement with going forward with this. 10-15-2022 upon evaluation today patient appears to be doing well currently in regard to his wounds that do not appear to be infected which is good news. With that being said unfortunately he still continues to have significant amount of swelling we really need to do something to try to get the swelling under control. I do not see any signs of infection locally nor systemically but at the same time the amount of edema is tremendous. The juxta lite helps but again he is only taking the fluid pills once or twice a week due to the amount that makes him go to the restroom. Therefore he does not take this daily which is really what he needs on a more regular basis. We need to try to get some compression on and is a little bit stronger. 10-22-2022 upon evaluation today patient actually appears to be making some really good progress in regard to his wound. The compression wrap was put on last time actually doing a great job he looks much improved and in general I feel like that we are making great progress. I do not see any evidence of active infection locally nor systemically which is great news. 10-29-2022 upon evaluation today patient appears to be doing decently well in regard to his legs currently. I feel like that his swelling is much better I feel like that he is also doing much better in regard to the wounds in general. I do not see any signs of  active infection which is great news and in general I think that he is making excellent progress towards complete resolution. The patient does seem to have 1 issue that is with his wraps actually slipping down working I definitely need to work on that aspect. 11-05-2022 upon evaluation today patient appears to be doing excellent in regard to his wound. He has been tolerating the dressing changes without complication. He actually seems to be making excellent progress I am extremely  pleased with where things stand currently. I do believe that we are really making good progress and I think that we are on the right track towards complete closure I think the compression wrap has been a dramatic shift in a good way for him as far as getting the wounds healed a lot of these areas that we will continue to leave Completely sealed up. 11-12-2022 upon evaluation today patient appears to be doing well currently in regard to his leg ulcer. The compression wrapping seems to be doing an excellent job. Fortunately I do not see any evidence of active infection locally nor systemically which is great news and in general I do believe that we are making good headway here towards complete closure. 11-19-2022 upon evaluation today patient appears to be doing well currently in regard to his wounds. He is actually showing signs of excellent improvement and very pleased with where we stand I think that he is making great progress. I do not see any evidence of infection at this time which is great news. 11-26-2022 upon evaluation today patient appears to be doing well currently in regard to his wound. He is actually been tolerating the dressing changes without complication. Fortunately I do not see any evidence of active infection locally nor systemically which is great news I think he is making excellent progress here towards closure. 12-03-2022 upon evaluation today patient appears to be doing well currently in regard to his wound. He in fact at both locations is doing great and seems to be making excellent progress and very pleased in that regard. I do not see any signs of active infection at this time. 12-10-2022 upon evaluation patient's wound is actually significantly smaller with one of the satellite lesions closed and there is just 1 small area remaining and this is actually doing quite well. I am very pleased with her progress. 12-15-2022 upon evaluation today patient appears to be doing actually  pretty well in regard to his wound on the left leg. He does have a little area on the backside that open that was previously close last week due to the fact that his wrap got wet over the weekend and he had to make do with stuff they can find at the drugstore. They made this work out however and the good news is he actually seems to be doing significantly better and the wound looks fine except for the fact that he has a couple of areas that just reopen on the backside very tiny. Nonetheless I do believe that we can go ahead and see what we do about trying to get this moving in the right direction yet again. 12-22-2022 upon evaluation today patient appears to be doing poorly in regard to his wrap slid down and his leg is extremely swollen. It is also painful secondary to it being so swollen. Fortunately I do not see any evidence of infection right now but at the same time I do believe that if we do not get the swelling under control is not  really good to alleviate his discomfort. We definitely can need to change his wrap out however in short order. 12-31-2022 upon evaluation today patient appears to be doing well currently in regard to his wound. He has been tolerating the dressing changes without complication. This actually looks to be doing much better today compared to where we were. Fortunately I do not see any evidence of active infection locally or systemically which is great news. 01-11-2023 upon evaluation today patient appears to be doing well currently in regard to his wound which is actually showing signs of significant improvement. Fortunately I do not see any signs of infection locally or systemically which is great news and in general I do believe that we are making good headway towards complete closure. Electronic Signature(s) Signed: 01/11/2023 1:24:02 PM By: Allen Derry PA-C Entered By: Allen Derry on 01/11/2023 13:24:02 [name] ([id number]) [id number]  Page 3 of 8 -------------------------------------------------------------------------------- Physical Exam Details Patient Name: Date of Service: [name] 01/11/2023 1:00 PM Medical Record Number: [id number] Patient Account Number: [id number] Date of Birth/Sex: Treating RN: 04-12-1956 (67 y.o. [name] Primary Care Provider: [name] Other Clinician: Referring Provider: Treating Provider/Extender: Matthew Folks in Treatment: 25 Constitutional Well-nourished and well-hydrated in no acute distress. Respiratory normal breathing without difficulty. Psychiatric this patient is able to make decisions and demonstrates good insight into disease process. Alert and Oriented x 3. pleasant and cooperative. Notes Upon inspection patient's wound did not require any sharp debridement this actually appears to be doing excellent at this point. Fortunately I do not see any signs of active infection locally nor systemically at this time. Electronic Signature(s) Signed: 01/11/2023 1:25:36 PM By: Allen Derry PA-C Entered By: Allen Derry on 01/11/2023 13:25:36 -------------------------------------------------------------------------------- Physician Orders Details Patient Name: Date of Service: [name] 01/11/2023 1:00 PM Medical Record Number: [id number] Patient Account Number: [id number] Date of Birth/Sex: Treating RN: 1956-02-09 (66 y.o. [name]) Yevonne Pax Primary Care Provider: [name] Other Clinician: Referring Provider: Treating Provider/Extender: Matthew Folks in Treatment: 25 Verbal / Phone Orders: No Diagnosis Coding ICD-10 Coding Code Description E11.622 Type 2 diabetes mellitus with other skin ulcer I89.0 Lymphedema, not elsewhere classified L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 L97.812 Non-pressure chronic ulcer of other part of right lower leg with fat layer  exposed I10 Essential (primary) hypertension I50.42 Chronic combined systolic (congestive) and diastolic (congestive) heart failure I49.9 Cardiac arrhythmia, unspecified SHINE, ANTAL ([id number]) [id number] Page 4 of 8 Follow-up Appointments Return Appointment in 1 week. Bathing/ Shower/ Hygiene May shower with wound dressing protected with water repellent cover or cast protector. Anesthetic (Use 'Patient Medications' Section for Anesthetic Order Entry) Lidocaine applied to wound bed Edema Control - Lymphedema / Segmental Compressive Device / Other Elevate, Exercise Daily and A void Standing for Long Periods of Time. Elevate legs to the level of the heart and pump ankles as often as possible Elevate leg(s) parallel to the floor when sitting. Compression Pump: Use compression pump on left lower extremity for 60 minutes, twice daily. Compression Pump: Use compression pump on right lower extremity for 60 minutes, twice daily. DO YOUR BEST to sleep in the bed at night. DO NOT sleep in your recliner. Long hours of sitting in a recliner leads to swelling of the legs and/or potential wounds on your backside. Wound Treatment Wound #2 - Lower Leg Wound Laterality: Left, Medial Cleanser: Soap and Water  1 x Per Week/30 Days Discharge Instructions: Gently cleanse wound with antibacterial soap, rinse and pat dry prior to dressing wounds Peri-Wound Care: AandD Ointment 1 x Per Week/30 Days Discharge Instructions: Apply AandD Ointment to leg Peri-Wound Care: Desitin Maximum Strength Ointment 4 (oz) 1 x Per Week/30 Days Discharge Instructions: use to anchor top of wrap Prim Dressing: Aquacel Extra Hydrofiber Dressing, 2x2 (in/in) ary 1 x Per Week/30 Days Secondary Dressing: Gauze 1 x Per Week/30 Days Discharge Instructions:  Compression Wrap: Urgo K2 Lite, two layer compression system, large 1 x Per Week/30 Days Electronic Signature(s) Unsigned Entered ByYevonne Pax on 01/11/2023 13:11:14 -------------------------------------------------------------------------------- Problem List Details Patient Name: Date of Service: [name] 01/11/2023 1:00 PM Medical Record Number: [id number] Patient Account Number: [id number] Date of Birth/Sex: Treating RN: 1956/05/02 (67 y.o. [name] Primary Care Provider: [name] Other Clinician: Referring Provider: Treating Provider/Extender: Matthew Folks in Treatment: 25 Active Problems ICD-10 Encounter Code Description Active Date MDM Diagnosis E11.622 Type 2 diabetes mellitus with other skin ulcer 07/16/2022 No Yes JEROMIE, MEZIERE ([id number]) [id number] Page 5 of 8 I89.0 Lymphedema, not elsewhere classified 07/16/2022 No Yes L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2/06/2022 No Yes L97.812 Non-pressure chronic ulcer of other part of right lower leg with fat layer 07/16/2022 No Yes exposed I10 Essential (primary) hypertension 07/16/2022 No Yes I50.42 Chronic combined systolic (congestive) and diastolic (congestive) heart failure 07/16/2022 No Yes I49.9 Cardiac arrhythmia, unspecified 07/16/2022 No Yes Inactive Problems Resolved Problems Electronic Signature(s) Signed: 01/11/2023 1:08:25 PM By: Allen Derry PA-C Entered By: Allen Derry on 01/11/2023 13:08:25 -------------------------------------------------------------------------------- Progress Note Details Patient Name: Date of Service: [name], [name] 01/11/2023 1:00 PM Medical Record Number: [id number] Patient Account Number: [id number] Date of Birth/Sex: Treating RN: [birth year] (67 y.o. [name] Primary Care Provider: [name] Other Clinician: Referring Provider: Treating Provider/Extender: Matthew Folks in Treatment: 25 Subjective Chief Complaint Information obtained from Patient Bilateral LE ulcers and  lymphedema History of Present Illness (HPI) 07-16-2022 upon evaluation today patient appears to be doing poorly currently in regard to his his legs. The left leg is actually worse than the right. This is a patient that is previously been seen in the [place] office and at that point was doing really quite well with compression wrapping and often alginate are either Hydrofera Blue dressings. Fortunately he has gone since November 2020 until just current with out having any issue or need for wound care services which is great news. Unfortunately he is here today because he is having some issues at this point. Patient does have a history of several medical conditions which include diabetes mellitus type 2, lymphedema, hypertension, congestive heart failure, and cardiac arrhythmia though is not sure that this is actually atrial fibrillation based on what he has been told. He does have lymphedema pumps but tells me he has not used them since the summer when he had to move to a remodel of his building and subsequently never unpacked them. 07-23-2022 upon evaluation today patient appears to be doing well with regard to his legs. He is going require some sharp debridement today but overall seems to be making some progress. I am pleased in that regard he has much less drainage that he had did last week I think the compression wraps are definitely helping. MONDO, ARAKAKI ([id number]) [id number] Page 6 of 8 07-30-2022 upon evaluation today patient appears  to be doing poorly in regard to his legs he actually has blue-green drainage which I think is consistent with Pseudomonas. I believe that we need to address this as soon as possible and I discussed that with the patient today as well. Fortunately I do not see any signs of active infection locally nor systemically at this time which is great news. No fevers, chills, nausea, vomiting, or diarrhea. 08-13-2022 upon evaluation today  patient appears to be doing well currently in regard to his leg ulcers which are actually looking much better. Fortunately there does not appear to be any signs of active infection at this time which is great news. No fevers, chills, nausea, vomiting, or diarrhea. 3/7; patient with predominantly a left medial lower extremity wound as well as several smaller areas and a small area on the right lateral. His Jodie Echevaria came in today. We are using silver alginate as the primary dressing 09-03-2022 upon evaluation today patient appears to be doing well currently in regard to his wounds. He has been tolerating the dressing changes without complication. Fortunately there does not appear to be any signs of infection there is needed however for sharp debridement. 09-10-2022 upon evaluation today patient appears to be doing well currently in regard to his wound. Has been tolerating the dressing changes without complication. Fortunately there does not appear to be any signs of infection looking systemically which is great news and overall I am extremely pleased with where we stand today. I do not see any signs of infection. 09-17-2022 upon evaluation today patient actually is making signs of improvement the good news is he is making good improvement. With that being said he still is having quite a time keeping the swelling down. We have been using Tubigrip I think that still probably our best option but nonetheless I think that we are making progress with regard to his wounds. 09-24-2022 upon evaluation today patient appears to be doing well currently in regard to his wounds is continuing to make improvements at this point which is great news and overall I am extremely pleased with where we stand today. Fortunately I do not see any evidence of active infection at this time which is great news and in general I think he is doing quite well with the [facility] and silver alginate. 10-01-2022 upon evaluation today patient  appears to be doing pretty well currently in regard to his wounds. He has been tolerating the dressing changes without complication. Fortunately I do not see any evidence of active infection locally nor systemically which is great news and I do believe that 10-08-2022 upon evaluation today patient's wounds actually are showing some signs of improvement which is great news. Still this is very slow to heal I really feel like he would do better if we can get him in some stronger compression wrapping he does have the juxta lite compression wraps therefore we can actually see about doing that over top of his Tubigrip to see if that can be beneficial. He does not love the hide he had as they are not the most comfortable thing for him but nonetheless I think it would be beneficial he is in agreement with going forward with this. 10-15-2022 upon evaluation today patient appears to be doing well currently in regard to his wounds that do not appear to be infected which is good news. With that being said unfortunately he still continues to have significant amount of swelling we really need to do something to try to get  the swelling under control. I do not see any signs of infection locally nor systemically but at the same time the amount of edema is tremendous. The juxta lite helps but again he is only taking the fluid pills once or twice a week due to the amount that makes him go to the restroom. Therefore he does not take this daily which is really what he needs on a more regular basis. We need to try to get some compression on and is a little bit stronger. 10-22-2022 upon evaluation today patient actually appears to be making some really good progress in regard to his wound. The compression wrap was put on last time actually doing a great job he looks much improved and in general I feel like that we are making great progress. I do not see any evidence of active infection locally nor systemically which is great  news. 10-29-2022 upon evaluation today patient appears to be doing decently well in regard to his legs currently. I feel like that his swelling is much better I feel like that he is also doing much better in regard to the wounds in general. I do not see any signs of active infection which is great news and in general I think that he is making excellent progress towards complete resolution. The patient does seem to have 1 issue that is with his wraps actually slipping down working I definitely need to work on that aspect. 11-05-2022 upon evaluation today patient appears to be doing excellent in regard to his wound. He has been tolerating the dressing changes without complication. He actually seems to be making excellent progress I am extremely pleased with where things stand currently. I do believe that we are really making good progress and I think that we are on the right track towards complete closure I think the compression wrap has been a dramatic shift in a good way for him as far as getting the wounds healed a lot of these areas that we will continue to leave Completely sealed up. 11-12-2022 upon evaluation today patient appears to be doing well currently in regard to his leg ulcer. The compression wrapping seems to be doing an excellent job. Fortunately I do not see any evidence of active infection locally nor systemically which is great news and in general I do believe that we are making good headway here towards complete closure. 11-19-2022 upon evaluation today patient appears to be doing well currently in regard to his wounds. He is actually showing signs of excellent improvement and very pleased with where we stand I think that he is making great progress. I do not see any evidence of infection at this time which is great news. 11-26-2022 upon evaluation today patient appears to be doing well currently in regard to his wound. He is actually been tolerating the dressing changes  without complication. Fortunately I do not see any evidence of active infection locally nor systemically which is great news I think he is making excellent progress here towards closure. 12-03-2022 upon evaluation today patient appears to be doing well currently in regard to his wound. He in fact at both locations is doing great and seems to be making excellent progress and very pleased in that regard. I do not see any signs of active infection at this time. 12-10-2022 upon evaluation patient's wound is actually significantly smaller with one of the satellite lesions closed and there is just 1 small area remaining and this is actually doing quite well. I am  very pleased with her progress. 12-15-2022 upon evaluation today patient appears to be doing actually pretty well in regard to his wound on the left leg. He does have a little area on the backside that open that was previously close last week due to the fact that his wrap got wet over the weekend and he had to make do with stuff they can find at the drugstore. They made this work out however and the good news is he actually seems to be doing significantly better and the wound looks fine except for the fact that he has a couple of areas that just reopen on the backside very tiny. Nonetheless I do believe that we can go ahead and see what we do about trying to get this moving in the right direction yet again. 12-22-2022 upon evaluation today patient appears to be doing poorly in regard to his wrap slid down and his leg is extremely swollen. It is also painful secondary to it being so swollen. Fortunately I do not see any evidence of infection right now but at the same time I do believe that if we do not get the swelling under control is not really good to alleviate his discomfort. We definitely can need to change his wrap out however in short order. 12-31-2022 upon evaluation today patient appears to be doing well currently in regard to his wound. He has  been tolerating the dressing changes without complication. This actually looks to be doing much better today compared to where we were. Fortunately I do not see any evidence of active infection locally or systemically which is great news. 01-11-2023 upon evaluation today patient appears to be doing well currently in regard to his wound which is actually showing signs of significant improvement. Fortunately I do not see any signs of infection locally or systemically which is great news and in general I do believe that we are making good headway towards complete closure. RYLEND, BATTENFIELD ([id number]) [id number] Page 7 of 8 Objective Constitutional Well-nourished and well-hydrated in no acute distress. Vitals Time Taken: 12:53 PM, Height: 73 in, Weight: 358 lbs, BMI: 47.2, Temperature: 97.9 F, Pulse: 77 bpm, Respiratory Rate: 18 breaths/min, Blood Pressure: 124/72 mmHg. Respiratory normal breathing without difficulty. Psychiatric this patient is able to make decisions and demonstrates good insight into disease process. Alert and Oriented x 3. pleasant and cooperative. General Notes: Upon inspection patient's wound did not require any sharp debridement this actually appears to be doing excellent at this point. Fortunately I do not see any signs of active infection locally nor systemically at this time. Integumentary (Hair, Skin) Wound #2 status is Open. Original cause of wound was Gradually Appeared. The date acquired was: 06/15/2022. The wound has been in treatment 25 weeks. The wound is located on the Left,Medial Lower Leg. The wound measures 1cm length x 2.5cm width x 0.1cm depth; 1.963cm^2 area and 0.196cm^3 volume. There is Fat Layer (Subcutaneous Tissue) exposed. There is no tunneling or undermining noted. There is a medium amount of serosanguineous drainage noted. There is large (67-100%) pink, pale granulation within the wound bed. There is a small (1-33%) amount of  necrotic tissue within the wound bed. 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 There was a Double Layer Compression Therapy Procedure by Yevonne Pax, RN. Post procedure Diagnosis Wound #2: Same as Pre-Procedure Plan Follow-up Appointments: Return Appointment in 1 week. Bathing/ Shower/ Hygiene: May shower with wound dressing protected with water repellent cover or cast protector. Anesthetic (Use 'Patient Medications' Section for Anesthetic Order Entry): Lidocaine applied to wound bed Edema Control - Lymphedema / Segmental Compressive Device / Other: Elevate, Exercise Daily and Avoid Standing for Long Periods of Time. Elevate legs to the level of the heart and pump ankles as often as possible Elevate leg(s) parallel to the floor when sitting. Compression Pump: Use compression pump on left lower extremity for 60 minutes, twice daily. Compression Pump: Use compression pump on right lower extremity for 60 minutes, twice daily. DO YOUR BEST to sleep in the bed at night. DO NOT sleep in your recliner. Long hours of sitting in a recliner leads to swelling of the legs and/or potential wounds on your backside. WOUND #2: - Lower Leg Wound Laterality: Left, Medial Cleanser: Soap and Water 1 x Per Week/30 Days Discharge Instructions: Gently cleanse wound with antibacterial soap, rinse and pat dry prior to dressing wounds Peri-Wound Care: AandD Ointment 1 x Per Week/30 Days Discharge Instructions:  Apply AandD Ointment to leg Peri-Wound Care: Desitin Maximum Strength Ointment 4 (oz) 1 x Per Week/30 Days Discharge Instructions: use to anchor top of wrap Prim Dressing: Aquacel Extra Hydrofiber Dressing, 2x2 (in/in) 1 x Per Week/30 Days ary Secondary Dressing: Gauze 1 x Per Week/30 Days Discharge Instructions:  IVANN, GUADAGNOLI ([id number]) [id number] Page 8 of 8 Compression Wrap: Urgo K2 Lite, two layer compression system, large 1 x Per Week/30 Days 1. I would recommend currently that we have the patient continue to monitor for any signs of infection or worsening. Based on what I am seeing I do believe that the patient is making good headway towards complete closure. 2. I am going to recommend as well that the patient should continue to monitor for any signs of infection or worsening. Obviously based on what I am seeing right now he seems to be doing excellent however. We will see patient back for reevaluation in 1 week here in the clinic. If anything worsens or changes patient will contact our office for additional recommendations. Electronic Signature(s) Signed: 01/11/2023 1:27:58 PM By: Allen Derry PA-C Entered By: Allen Derry on 01/11/2023 13:27:58 -------------------------------------------------------------------------------- SuperBill Details Patient Name: Date of Service: [name] 01/11/2023 Medical Record Number: [id number] Patient Account Number: [id number] Date of Birth/Sex: Treating RN: Jun 24, 1955 (66 y.o. [name]) Yevonne Pax Primary Care Provider: [name] Other Clinician: Referring Provider: Treating Provider/Extender: Matthew Folks in Treatment: 25 Diagnosis Coding ICD-10 Codes Code Description E11.622 Type 2 diabetes mellitus with other skin ulcer I89.0 Lymphedema, not elsewhere classified L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 L97.812 Non-pressure chronic  ulcer of other part of right lower leg with fat layer exposed I10 Essential (primary) hypertension I50.42 Chronic combined systolic (congestive) and diastolic (congestive) heart failure I49.9 Cardiac arrhythmia, unspecified Facility Procedures : CPT4 Code: [id number] Description: (Facility Use Only) 29581LT - APPLY MULTLAY COMPRS LWR LT LEG Modifier: Quantity: 1 Physician Procedures : CPT4 Code Description Modifier [id number] - WC PHYS LEVEL 3 - EST PT ICD-10 Diagnosis Description E11.622 Type 2 diabetes mellitus with other  skin ulcer I89.0 Lymphedema, not elsewhere classified L97.822 Non-pressure chronic ulcer of other part of  left lower leg with fat layer exposed L97.812 Non-pressure chronic ulcer of other part of right lower leg with fat layer exposed Quantity: 1 Electronic Signature(s) Signed: 01/11/2023 1:28:40 PM By: Allen Derry PA-C Entered By: Allen Derry on 01/11/2023 13:28:40

## 2023-01-13 NOTE — Progress Notes (Signed)
JESSICA, OSTRAND ([id number]) [id number] Page 1 of 9 Visit Report for 01/11/2023 Arrival Information Details Patient Name: Date of Service: [name] 01/11/2023 1:00 PM Medical Record Number: [id number] Patient Account Number: [id number] Date of Birth/Sex: Treating RN: Jul 07, 1955 (66 y.o. [name]) Yevonne Pax Primary Care Ketzaly Cardella: [name] Other Clinician: Referring Daylani Deblois: Treating Liandra Mendia/Extender: Matthew Folks in Treatment: 25 Visit Information History Since Last Visit Added or deleted any medications: No Patient Arrived: Ambulatory Any new allergies or adverse reactions: No Arrival Time: 12:53 Had a fall or experienced change in No Accompanied By: self activities of daily living that may affect Transfer Assistance: None risk of falls: Patient Identification Verified: Yes Signs or symptoms of abuse/neglect since last visito No Secondary Verification Process Completed: Yes Hospitalized since last visit: No Patient Requires Transmission-Based Precautions: No Implantable device outside of the clinic excluding No Patient Has Alerts: No cellular tissue based products placed in the center since last visit: Has Dressing in Place as Prescribed: Yes Pain Present Now: No Electronic Signature(s) Signed: 01/13/2023 4:21:14 PM By: Yevonne Pax RN Entered By: Yevonne Pax on 01/11/2023 12:53:30 -------------------------------------------------------------------------------- Clinic Level of Care Assessment Details Patient Name: Date of Service: [name] 01/11/2023 1:00 PM Medical Record Number: [id number] Patient Account Number: [id number] Date of Birth/Sex: Treating RN: 03-Jan-1956 (66 y.o. [name]) Yevonne Pax Primary Care Karryn Kosinski: [name] Other Clinician: Referring Emmogene Simson: Treating Brookelin Felber/Extender: Matthew Folks in Treatment: 25 Clinic Level of Care Assessment Items TOOL 1 Quantity  Score []  - 0 Use when EandM and Procedure is performed on INITIAL visit ASSESSMENTS - Nursing Assessment / Reassessment []  - 0 General Physical Exam (combine w/ comprehensive assessment (listed just below) when performed on new pt. evals) []  - 0 Comprehensive Assessment (HX, ROS, Risk Assessments, Wounds Hx, etc.) [name], [name] ([id number]) [phone].pdf Page 2 of 9 ASSESSMENTS - Wound and Skin Assessment / Reassessment []  - 0 Dermatologic / Skin Assessment (not related to wound area) ASSESSMENTS - Ostomy and/or Continence Assessment and Care []  - 0 Incontinence Assessment and Management []  - 0 Ostomy Care Assessment and Management (repouching, etc.) PROCESS - Coordination of Care []  - 0 Simple Patient / Family Education for ongoing care []  - 0 Complex (extensive) Patient / Family Education for ongoing care []  - 0 Staff obtains Chiropractor, Records, T Results / Process Orders est []  - 0 Staff telephones HHA, Nursing Homes / Clarify orders / etc []  - 0 Routine Transfer to another Facility (non-emergent condition) []  - 0 Routine Hospital Admission (non-emergent condition) []  - 0 New Admissions / Manufacturing engineer / Ordering NPWT Apligraf, etc. , []  - 0 Emergency Hospital Admission (emergent condition) PROCESS - Special Needs []  - 0 Pediatric / Minor Patient Management []  - 0 Isolation Patient Management []  - 0 Hearing / Language / Visual special needs []  - 0 Assessment of Community assistance (transportation, D/C planning, etc.) []  - 0 Additional assistance / Altered mentation []  - 0 Support Surface(s) Assessment (bed, cushion, seat, etc.) INTERVENTIONS - Miscellaneous []  - 0 External ear exam []  - 0 Patient Transfer (multiple staff / Nurse, adult / Similar devices) []  - 0 Simple Staple / Suture removal (25 or less) []  - 0 Complex Staple / Suture removal (26 or more) []  - 0 Hypo/Hyperglycemic Management (do not check if billed  separately) []  - 0 Ankle / Brachial Index (ABI) - do not check if billed separately Has the patient been seen at the hospital within the  last three years: Yes Total Score: 0 Level Of Care: ____ Electronic Signature(s) Signed: 01/13/2023 4:21:14 PM By: Yevonne Pax RN Entered By: Yevonne Pax on 01/11/2023 13:11:20 -------------------------------------------------------------------------------- Compression Therapy Details Patient Name: Date of Service: Matthew Burly [name] 01/11/2023 1:00 PM Medical Record Number: [id number] Patient Account Number: [id number] Date of Birth/Sex: Treating RN: May 16, 1956 (66 y.o. [name] Primary Care Davyn Morandi: [name] Other Clinician: Referring Aireanna Luellen: Treating Brailyn Delman/Extender: Matthew Folks in Treatment: [address] ([id number]) [id number] Page 3 of 9 Compression Therapy Performed for Wound Assessment: Wound #2 Left,Medial Lower Leg Performed By: Clinician Yevonne Pax, RN Compression Type: Double Layer Post Procedure Diagnosis Same as Pre-procedure Electronic Signature(s) Signed: 01/13/2023 4:21:14 PM By: Yevonne Pax RN Entered By: Yevonne Pax on 01/11/2023 13:09:45 -------------------------------------------------------------------------------- Encounter Discharge Information Details Patient Name: Date of Service: Matthew Burly [name] 01/11/2023 1:00 PM Medical Record Number: [id number] Patient Account Number: [id number] Date of Birth/Sex: Treating RN: 1955-10-12 (66 y.o. [name] Primary Care Mayme Profeta: [name] Other Clinician: Referring Sadeel Fiddler: Treating Abigael Mogle/Extender: Matthew Folks in Treatment: 25 Encounter Discharge Information Items Discharge Condition: Stable Ambulatory Status: Cane Discharge Destination: Home Transportation: Private Auto Accompanied By: self Schedule Follow-up Appointment: Yes Clinical Summary of  Care: Electronic Signature(s) Signed: 01/13/2023 4:21:14 PM By: Yevonne Pax RN Entered By: Yevonne Pax on 01/11/2023 13:12:05 -------------------------------------------------------------------------------- Lower Extremity Assessment Details Patient Name: Date of Service: [name] 01/11/2023 1:00 PM Medical Record Number: [id number] Patient Account Number: [id number] Date of Birth/Sex: Treating RN: 1955-08-14 (66 y.o. [name] Primary Care Jayleah Garbers: [name] Other Clinician: Referring Xayvion Shirah: Treating Adaleena Mooers/Extender: Matthew Folks in Treatment: 25 Edema Assessment Assessed: [name]: No] [name]: No] G[LeftISAAC, [name] ([id number])] [Right: [id number] Page 4 of 9] Edema: [Left: N] [Right: o] Calf Left: Right: Point of Measurement: 35 cm From Medial Instep 60 cm Ankle Left: Right: Point of Measurement: 10 cm From Medial Instep 29 cm Vascular Assessment Pulses: Dorsalis Pedis Palpable: [Left:Yes] Extremity colors, hair growth, and conditions: Extremity Color: [Left:Hyperpigmented] Hair Growth on Extremity: [Left:No] Temperature of Extremity: [Left:Warm] Capillary Refill: [Left:< 3 seconds] Dependent Rubor: [Left:No] Blanched when Elevated: [Left:No Yes] Toe Nail Assessment Left: Right: Thick: Yes Discolored: Yes Deformed: Yes Improper Length and Hygiene: Yes Electronic Signature(s) Signed: 01/13/2023 4:21:14 PM By: Yevonne Pax RN Entered By: Yevonne Pax on 01/11/2023 13:06:01 -------------------------------------------------------------------------------- Multi Wound Chart Details Patient Name: Date of Service: Matthew Burly [name] 01/11/2023 1:00 PM Medical Record Number: [id number] Patient Account Number: [id number] Date of Birth/Sex: Treating RN: 05/03/1956 (66 y.o. [name]) Yevonne Pax Primary Care Ashlee Bewley: [name] Other Clinician: Referring Nishika Parkhurst: Treating Ileanna Gemmill/Extender: Matthew Folks in Treatment: 25 Vital Signs Height(in): 73 Pulse(bpm): 77 Weight(lbs): 358 Blood Pressure(mmHg): 124/72 Body Mass Index(BMI): 47.2 Temperature(F): 97.9 Respiratory Rate(breaths/min): 18 [2:Photos:] [N/A:N/A] Left, Medial Lower Leg N/A N/A Wound Location: Gradually Appeared N/A N/A Wounding Event: Venous Leg Ulcer N/A N/A Primary Etiology: Diabetic Wound/Ulcer of the Lower N/A N/A Secondary Etiology: Extremity Arrhythmia, Congestive Heart Failure, N/A N/A Comorbid History: Hypertension, Type II Diabetes 06/15/2022 N/A N/A Date Acquired: 25 N/A N/A Weeks of Treatment: Open N/A N/A Wound Status: No N/A N/A Wound Recurrence: 1x2.5x0.1 N/A N/A Measurements L x W x D (cm) 1.963 N/A N/A A (cm) : rea 0.196 N/A N/A Volume (cm) : 99.70% N/A N/A % Reduction in Area: 99.70% N/A N/A % Reduction in Volume: Full Thickness Without Exposed N/A N/A Classification:  Support Structures Medium N/A N/A Exudate Amount: Serosanguineous N/A N/A Exudate Type: red, brown N/A N/A Exudate Color: Large (67-100%) N/A N/A Granulation Amount: Pink, Pale N/A N/A Granulation Quality: Small (1-33%) N/A N/A Necrotic Amount: Fat Layer (Subcutaneous Tissue): Yes N/A N/A Exposed Structures: Fascia: No Tendon: No Muscle: No Joint: No Bone: No None N/A N/A Epithelialization: Treatment Notes Electronic Signature(s) Signed: 01/13/2023 4:21:14 PM By: Yevonne Pax RN Entered By: Yevonne Pax on 01/11/2023 13:06:10 -------------------------------------------------------------------------------- Multi-Disciplinary Care Plan Details Patient Name: Date of Service: Matthew Burly [name] 01/11/2023 1:00 PM Medical Record Number: [id number] Patient Account Number: [id number] Date of Birth/Sex: Treating RN: 01-19-1956 (66 y.o. [name] Primary Care Cassey Hurrell: [name] Other Clinician: Referring Woodson Macha: Treating Rilei Kravitz/Extender: Matthew Folks in Treatment: 25 Active Inactive Venous Leg Ulcer Nursing Diagnoses: Actual venous Insuffiency (use after diagnosis is confirmed) Goals: Patient will maintain optimal edema control Date Initiated: 10/15/2022 Target Resolution Date: 01/12/2023 Goal Status: Active Patient/caregiver will verbalize understanding of disease process and disease management [name], [name] ([id number]) [phone].pdf Page 6 of 9 Date Initiated: 10/15/2022 Date Inactivated: 11/12/2022 Target Resolution Date: 10/15/2022 Goal Status: Met Verify adequate tissue perfusion prior to therapeutic compression application Date Initiated: 10/15/2022 Date Inactivated: 11/12/2022 Target Resolution Date: 10/15/2022 Goal Status: Met Interventions: Assess peripheral edema status every visit. Compression as ordered Provide education on venous insufficiency Treatment Activities: Therapeutic compression applied : 10/15/2022 Notes: Electronic Signature(s) Signed: 01/13/2023 4:21:14 PM By: Yevonne Pax RN Entered By: Yevonne Pax on 01/11/2023 13:06:23 -------------------------------------------------------------------------------- Pain Assessment Details Patient Name: Date of Service: Matthew Burly [name] 01/11/2023 1:00 PM Medical Record Number: [id number] Patient Account Number: [id number] Date of Birth/Sex: Treating RN: February 16, 1956 (66 y.o. [name] Primary Care Zymere Patlan: [name] Other Clinician: Referring Tamaj Jurgens: Treating Chasmine Lender/Extender: Matthew Folks in Treatment: 25 Active Problems Location of Pain Severity and Description of Pain Patient Has Paino No Site Locations Pain Management and Medication Current Pain Management: Electronic Signature(s) Signed: 01/13/2023 4:21:14 PM By: Yevonne Pax RN Entered By: Yevonne Pax on 01/11/2023 12:54:09 [name] ([id number]) [id number] Page 7 of  9 -------------------------------------------------------------------------------- Patient/Caregiver Education Details Patient Name: Date of Service: [name] 7/29/2024andnbsp1:00 PM Medical Record Number: [id number] Patient Account Number: [id number] Date of Birth/Gender: Treating RN: 12-20-1955 (66 y.o. [name]) Yevonne Pax Primary Care Physician: [name] Other Clinician: Referring Physician: Treating Physician/Extender: Matthew Folks in Treatment: 25 Education Assessment Education Provided To: Patient Education Topics Provided Wound/Skin Impairment: Handouts: Caring for Your Ulcer Methods: Explain/Verbal Responses: State content correctly Electronic Signature(s) Signed: 01/13/2023 4:21:14 PM By: Yevonne Pax RN Entered By: Yevonne Pax on 01/11/2023 13:06:36 -------------------------------------------------------------------------------- Wound Assessment Details Patient Name: Date of Service: [name] 01/11/2023 1:00 PM Medical Record Number: [id number] Patient Account Number: [id number] Date of Birth/Sex: Treating RN: 1956-06-07 (66 y.o. [name]) Yevonne Pax Primary Care Ramone Gander: [name] Other Clinician: Referring Edona Schreffler: Treating Makinzy Cleere/Extender: Matthew Folks in Treatment: 25 Wound Status Wound Number: 2 Primary Etiology: Venous Leg Ulcer Wound Location: Left, Medial Lower Leg Secondary Diabetic Wound/Ulcer of the Lower Extremity Etiology: Wounding Event: Gradually Appeared Wound Status: Open Date Acquired: 06/15/2022 Comorbid Arrhythmia, Congestive Heart Failure, Hypertension, Type Weeks Of Treatment: 25 History: II Diabetes Clustered Wound: No Photos [name], [name] ([id number]) [id number] Page 8 of 9 Wound Measurements Length: (cm) 1 Width: (cm) 2.5 Depth: (cm) 0.1 Area: (cm) 1.963 Volume: (cm) 0.196 % Reduction in Area: 99.7% % Reduction in  Volume:  99.7% Epithelialization: None Tunneling: No Undermining: No Wound Description Classification: Full Thickness Without Exposed Suppor Exudate Amount: Medium Exudate Type: Serosanguineous Exudate Color: red, brown t Structures Foul Odor After Cleansing: No Slough/Fibrino Yes Wound Bed Granulation Amount: Large (67-100%) Exposed Structure Granulation Quality: Pink, Pale Fascia Exposed: No Necrotic Amount: Small (1-33%) Fat Layer (Subcutaneous Tissue) Exposed: Yes Tendon Exposed: No Muscle Exposed: No Joint Exposed: No Bone Exposed: No Treatment Notes Wound #2 (Lower Leg) Wound Laterality: Left, Medial Cleanser Soap and Water Discharge Instruction: Gently cleanse wound with antibacterial soap, rinse and pat dry prior to dressing wounds Peri-Wound Care AandD Ointment Discharge Instruction: Apply AandD Ointment to leg Desitin Maximum Strength Ointment 4 (oz) Discharge Instruction: use to anchor top of wrap Topical Primary Dressing Aquacel Extra Hydrofiber Dressing, 2x2 (in/in) Secondary Dressing Gauze Discharge Instruction:  Secured With Compression Wrap Urgo K2 Lite, two layer compression system, large Compression Stockings Add-Ons Electronic Signature(s) Signed: 01/13/2023 4:21:14 PM By: Yevonne Pax RN Entered By: Yevonne Pax on 01/11/2023 13:03:28 [name] ([id number]) [id number] Page 9 of 9 -------------------------------------------------------------------------------- Vitals Details Patient Name: Date of Service: [name] 01/11/2023 1:00 PM Medical Record Number: [id number] Patient Account Number: [id number] Date of Birth/Sex: Treating RN: 1955-09-26 (66 y.o. [name]) Yevonne Pax Primary Care Chip Canepa: [name] Other Clinician: Referring Krystie Leiter: Treating Alaiah Lundy/Extender: Matthew Folks in Treatment: 25 Vital Signs Time Taken: 12:53 Temperature (F): 97.9 Height (in): 73 Pulse (bpm):  77 Weight (lbs): 358 Respiratory Rate (breaths/min): 18 Body Mass Index (BMI): 47.2 Blood Pressure (mmHg): 124/72 Reference Range: 80 - 120 mg / dl Electronic Signature(s) Signed: 01/13/2023 4:21:14 PM By: Yevonne Pax RN Entered By: Yevonne Pax on 01/11/2023 12:54:00

## 2023-01-19 ENCOUNTER — Encounter: Payer: PPO | Attending: Physician Assistant | Admitting: Physician Assistant

## 2023-01-19 DIAGNOSIS — E11622 Type 2 diabetes mellitus with other skin ulcer: Secondary | ICD-10-CM | POA: Insufficient documentation

## 2023-01-19 DIAGNOSIS — I110 Hypertensive heart disease with heart failure: Secondary | ICD-10-CM | POA: Diagnosis not present

## 2023-01-19 DIAGNOSIS — I499 Cardiac arrhythmia, unspecified: Secondary | ICD-10-CM | POA: Insufficient documentation

## 2023-01-19 DIAGNOSIS — L97812 Non-pressure chronic ulcer of other part of right lower leg with fat layer exposed: Secondary | ICD-10-CM | POA: Diagnosis not present

## 2023-01-19 DIAGNOSIS — L97822 Non-pressure chronic ulcer of other part of left lower leg with fat layer exposed: Secondary | ICD-10-CM | POA: Diagnosis not present

## 2023-01-19 DIAGNOSIS — I5042 Chronic combined systolic (congestive) and diastolic (congestive) heart failure: Secondary | ICD-10-CM | POA: Insufficient documentation

## 2023-01-19 DIAGNOSIS — I890 Lymphedema, not elsewhere classified: Secondary | ICD-10-CM | POA: Diagnosis not present

## 2023-01-19 DIAGNOSIS — I872 Venous insufficiency (chronic) (peripheral): Secondary | ICD-10-CM | POA: Diagnosis not present

## 2023-01-19 NOTE — Progress Notes (Signed)
[name], [name] ([id number]) [id number] Page 1 of 9 Visit Report for 01/19/2023 Chief Complaint Document Details Patient Name: Date of Service: [name] 01/19/2023 1:00 PM Medical Record Number: [id number] Patient Account Number: [id number] Date of Birth/Sex: Treating RN: August 02, 1955 (67 y.o. [name] Primary Care Provider: [name] Other Clinician: Referring Provider: Treating Provider/Extender: Matthew Folks in Treatment: 26 Information Obtained from: Patient Chief Complaint Bilateral LE ulcers and lymphedema Electronic Signature(s) Signed: 01/19/2023 1:26:53 PM By: Allen Derry PA-C Entered By: Allen Derry on 01/19/2023 13:26:53 -------------------------------------------------------------------------------- HPI Details Patient Name: Date of Service: [name], Matthew [name] 01/19/2023 1:00 PM Medical Record Number: [id number] Patient Account Number: [id number] Date of Birth/Sex: Treating RN: [birth year] (67 y.o. [name] Primary Care Provider: [name] Other Clinician: Referring Provider: Treating Provider/Extender: Matthew Folks in Treatment: 26 History of Present Illness HPI Description: 07-16-2022 upon evaluation today patient appears to be doing poorly currently in regard to his his legs. The left leg is actually worse than the right. This is a patient that is previously been seen in the [place] office and at that point was doing really quite well with compression wrapping and often alginate are either Hydrofera Blue dressings. Fortunately he has gone since November 2020 until just current with out having any issue or need for wound care services which is great news. Unfortunately he is here today because he is having some issues at this point. Patient does have a history of several medical conditions which include diabetes mellitus type 2, lymphedema, hypertension, congestive heart  failure, and cardiac arrhythmia though is not sure that this is actually atrial fibrillation based on what he has been told. He does have lymphedema pumps but tells me he has not used them since the summer when he had to move to a remodel of his building and subsequently never unpacked them. 07-23-2022 upon evaluation today patient appears to be doing well with regard to his legs. He is going require some sharp debridement today but overall seems to be making some progress. I am pleased in that regard he has much less drainage that he had did last week I think the compression wraps are definitely helping. 07-30-2022 upon evaluation today patient appears to be doing poorly in regard to his legs he actually has blue-green drainage which I think is consistent with Pseudomonas. I believe that we need to address this as soon as possible and I discussed that with the patient today as well. Fortunately I do not see any signs of active infection locally nor systemically at this time which is great news. No fevers, chills, nausea, vomiting, or diarrhea. [name], [name] ([id number]) [id number] Page 2 of 9 08-13-2022 upon evaluation today patient appears to be doing well currently in regard to his leg ulcers which are actually looking much better. Fortunately there does not appear to be any signs of active infection at this time which is great news. No fevers, chills, nausea, vomiting, or diarrhea. 3/7; patient with predominantly a left medial lower extremity wound as well as several smaller areas and a small area on the right lateral. His Jodie Echevaria came in today. We are using silver alginate as the primary dressing 09-03-2022 upon evaluation today patient appears to be doing well currently in regard to his wounds. He has been tolerating the dressing changes without complication. Fortunately there does not appear to be any signs of infection there is needed however for sharp  debridement. 09-10-2022  upon evaluation today patient appears to be doing well currently in regard to his wound. Has been tolerating the dressing changes without complication. Fortunately there does not appear to be any signs of infection looking systemically which is great news and overall I am extremely pleased with where we stand today. I do not see any signs of infection. 09-17-2022 upon evaluation today patient actually is making signs of improvement the good news is he is making good improvement. With that being said he still is having quite a time keeping the swelling down. We have been using Tubigrip I think that still probably our best option but nonetheless I think that we are making progress with regard to his wounds. 09-24-2022 upon evaluation today patient appears to be doing well currently in regard to his wounds is continuing to make improvements at this point which is great news and overall I am extremely pleased with where we stand today. Fortunately I do not see any evidence of active infection at this time which is great news and in general I think he is doing quite well with the [facility] and silver alginate. 10-01-2022 upon evaluation today patient appears to be doing pretty well currently in regard to his wounds. He has been tolerating the dressing changes without complication. Fortunately I do not see any evidence of active infection locally nor systemically which is great news and I do believe that 10-08-2022 upon evaluation today patient's wounds actually are showing some signs of improvement which is great news. Still this is very slow to heal I really feel like he would do better if we can get him in some stronger compression wrapping he does have the juxta lite compression wraps therefore we can actually see about doing that over top of his Tubigrip to see if that can be beneficial. He does not love the hide he had as they are not the most comfortable thing for him but nonetheless  I think it would be beneficial he is in agreement with going forward with this. 10-15-2022 upon evaluation today patient appears to be doing well currently in regard to his wounds that do not appear to be infected which is good news. With that being said unfortunately he still continues to have significant amount of swelling we really need to do something to try to get the swelling under control. I do not see any signs of infection locally nor systemically but at the same time the amount of edema is tremendous. The juxta lite helps but again he is only taking the fluid pills once or twice a week due to the amount that makes him go to the restroom. Therefore he does not take this daily which is really what he needs on a more regular basis. We need to try to get some compression on and is a little bit stronger. 10-22-2022 upon evaluation today patient actually appears to be making some really good progress in regard to his wound. The compression wrap was put on last time actually doing a great job he looks much improved and in general I feel like that we are making great progress. I do not see any evidence of active infection locally nor systemically which is great news. 10-29-2022 upon evaluation today patient appears to be doing decently well in regard to his legs currently. I feel like that his swelling is much better I feel like that he is also doing much better in regard to the wounds in general. I do not see any signs of  active infection which is great news and in general I think that he is making excellent progress towards complete resolution. The patient does seem to have 1 issue that is with his wraps actually slipping down working I definitely need to work on that aspect. 11-05-2022 upon evaluation today patient appears to be doing excellent in regard to his wound. He has been tolerating the dressing changes without complication. He actually seems to be making excellent progress I am extremely  pleased with where things stand currently. I do believe that we are really making good progress and I think that we are on the right track towards complete closure I think the compression wrap has been a dramatic shift in a good way for him as far as getting the wounds healed a lot of these areas that we will continue to leave Completely sealed up. 11-12-2022 upon evaluation today patient appears to be doing well currently in regard to his leg ulcer. The compression wrapping seems to be doing an excellent job. Fortunately I do not see any evidence of active infection locally nor systemically which is great news and in general I do believe that we are making good headway here towards complete closure. 11-19-2022 upon evaluation today patient appears to be doing well currently in regard to his wounds. He is actually showing signs of excellent improvement and very pleased with where we stand I think that he is making great progress. I do not see any evidence of infection at this time which is great news. 11-26-2022 upon evaluation today patient appears to be doing well currently in regard to his wound. He is actually been tolerating the dressing changes without complication. Fortunately I do not see any evidence of active infection locally nor systemically which is great news I think he is making excellent progress here towards closure. 12-03-2022 upon evaluation today patient appears to be doing well currently in regard to his wound. He in fact at both locations is doing great and seems to be making excellent progress and very pleased in that regard. I do not see any signs of active infection at this time. 12-10-2022 upon evaluation patient's wound is actually significantly smaller with one of the satellite lesions closed and there is just 1 small area remaining and this is actually doing quite well. I am very pleased with her progress. 12-15-2022 upon evaluation today patient appears to be doing actually  pretty well in regard to his wound on the left leg. He does have a little area on the backside that open that was previously close last week due to the fact that his wrap got wet over the weekend and he had to make do with stuff they can find at the drugstore. They made this work out however and the good news is he actually seems to be doing significantly better and the wound looks fine except for the fact that he has a couple of areas that just reopen on the backside very tiny. Nonetheless I do believe that we can go ahead and see what we do about trying to get this moving in the right direction yet again. 12-22-2022 upon evaluation today patient appears to be doing poorly in regard to his wrap slid down and his leg is extremely swollen. It is also painful secondary to it being so swollen. Fortunately I do not see any evidence of infection right now but at the same time I do believe that if we do not get the swelling under control is not  really good to alleviate his discomfort. We definitely can need to change his wrap out however in short order. 12-31-2022 upon evaluation today patient appears to be doing well currently in regard to his wound. He has been tolerating the dressing changes without complication. This actually looks to be doing much better today compared to where we were. Fortunately I do not see any evidence of active infection locally or systemically which is great news. 01-11-2023 upon evaluation today patient appears to be doing well currently in regard to his wound which is actually showing signs of significant improvement. Fortunately I do not see any signs of infection locally or systemically which is great news and in general I do believe that we are making good headway towards complete closure. 01-19-2023 upon evaluation today patient appears to be doing well currently hemoglobin to his wound. He has been tolerating the dressing changes without complication. Fortunately the wound is  not infected unfortunately it is a little bigger due to the fact that the wrap slid down. I do not see any signs of worsening or infection at this time. Electronic Signature(s) Signed: 01/19/2023 1:44:14 PM By: Franz Dell ([id number]) [id number] Page 3 of 9 Entered By: Allen Derry on 01/19/2023 13:44:14 -------------------------------------------------------------------------------- Physical Exam Details Patient Name: Date of Service: [name] 01/19/2023 1:00 PM Medical Record Number: [id number] Patient Account Number: [id number] Date of Birth/Sex: Treating RN: 24-Aug-1955 (67 y.o. [name] Primary Care Provider: [name] Other Clinician: Referring Provider: Treating Provider/Extender: Matthew Folks in Treatment: 26 Constitutional Well-nourished and well-hydrated in no acute distress. Respiratory normal breathing without difficulty. Psychiatric this patient is able to make decisions and demonstrates good insight into disease process. Alert and Oriented x 3. pleasant and cooperative. Notes Upon inspection patient's wound bed actually showed signs of good granulation physician at this point. Fortunately I do not see any signs of worsening at this time and in general I do think that you are making good headway towards complete closure which is great news. Electronic Signature(s) Signed: 01/19/2023 1:48:53 PM By: Allen Derry PA-C Entered By: Allen Derry on 01/19/2023 13:48:53 -------------------------------------------------------------------------------- Physician Orders Details Patient Name: Date of Service: [name], Matthew [name] 01/19/2023 1:00 PM Medical Record Number: [id number] Patient Account Number: [id number] Date of Birth/Sex: Treating RN: 07/12/1955 (67 y.o. [name] Primary Care Provider: [name] Other Clinician: Referring Provider: Treating Provider/Extender: Matthew Folks in Treatment: 26 Verbal / Phone Orders: No Diagnosis Coding ICD-10 Coding Code Description E11.622 Type 2 diabetes mellitus with other skin ulcer I89.0 Lymphedema, not elsewhere classified L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 L97.812 Non-pressure chronic ulcer of other part of right lower leg with fat layer exposed [name], [name] ([id number]) [phone].pdf Page 4 of 9 I10 Essential (primary) hypertension I50.42 Chronic combined systolic (congestive) and diastolic (congestive) heart failure I49.9 Cardiac arrhythmia, unspecified Follow-up Appointments Return Appointment in 1 week. Bathing/ Shower/ Hygiene May shower with wound dressing protected with water repellent cover or cast protector. Anesthetic (Use 'Patient Medications' Section for Anesthetic Order Entry) Lidocaine applied to wound bed Edema Control - Lymphedema / Segmental Compressive Device / Other Elevate, Exercise Daily and A void Standing for Long Periods of Time. Elevate legs to the level of the heart and pump ankles as often as possible Elevate leg(s) parallel to the floor when sitting. Compression Pump: Use compression pump on left lower extremity for  60 minutes, twice daily. Compression Pump: Use compression pump on right lower extremity for 60 minutes, twice daily. DO YOUR BEST to sleep in the bed at night. DO NOT sleep in your recliner. Long hours of sitting in a recliner leads to swelling of the legs and/or potential wounds on your backside. Wound Treatment Wound #2 - Lower Leg Wound Laterality: Left, Medial Cleanser: Soap and Water 1 x Per Week/30 Days Discharge Instructions: Gently cleanse wound with antibacterial soap, rinse and pat dry prior to dressing wounds Peri-Wound Care: AandD Ointment 1 x Per Week/30 Days Discharge Instructions: Apply AandD Ointment to leg Peri-Wound Care: Desitin Maximum Strength  Ointment 4 (oz) 1 x Per Week/30 Days Discharge Instructions: Apply around wound edges to avoid maceration Prim Dressing: Aquacel Extra Hydrofiber Dressing, 2x2 (in/in) ary 1 x Per Week/30 Days Secondary Dressing: ABD Pad 5x9 (in/in) 1 x Per Week/30 Days Discharge Instructions: Cover with ABD pad Compression Wrap: Urgo K2 Lite, two layer compression system, large 1 x Per Week/30 Days Electronic Signature(s) Unsigned Entered By: Angelina Pih on 01/19/2023 13:55:22 -------------------------------------------------------------------------------- Problem List Details Patient Name: Date of Service: [name] 01/19/2023 1:00 PM Medical Record Number: [id number] Patient Account Number: [id number] Date of Birth/Sex: Treating RN: 07-Dec-1955 (67 y.o. [name] Primary Care Provider: [name] Other Clinician: Referring Provider: Treating Provider/Extender: Matthew Folks in Treatment: 26 Active Problems ICD-10 Encounter Code Description Active Date MDM Diagnosis [name], [name] ([id number]) [id number].pdf Page 5 of 9 E11.622 Type 2 diabetes mellitus with other skin ulcer 07/16/2022 No Yes I89.0 Lymphedema, not elsewhere classified 07/16/2022 No Yes L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2/06/2022 No Yes L97.812 Non-pressure chronic ulcer of other part of right lower leg with fat layer 07/16/2022 No Yes exposed I10 Essential (primary) hypertension 07/16/2022 No Yes I50.42 Chronic combined systolic (congestive) and diastolic (congestive) heart failure 07/16/2022 No Yes I49.9 Cardiac arrhythmia, unspecified 07/16/2022 No Yes Inactive Problems Resolved Problems Electronic Signature(s) Signed: 01/19/2023 1:26:47 PM By: Allen Derry PA-C Entered By: Allen Derry on 01/19/2023 13:26:46 -------------------------------------------------------------------------------- Progress Note Details Patient  Name: Date of Service: [name], Matthew [name] 01/19/2023 1:00 PM Medical Record Number: [id number] Patient Account Number: [id number] Date of Birth/Sex: Treating RN: 03-17-1956 (67 y.o. [name] Primary Care Provider: [name] Other Clinician: Referring Provider: Treating Provider/Extender: Matthew Folks in Treatment: 26 Subjective Chief Complaint Information obtained from Patient Bilateral LE ulcers and lymphedema History of Present Illness (HPI) 07-16-2022 upon evaluation today patient appears to be doing poorly currently in regard to his his legs. The left leg is actually worse than the right. This is a patient that is previously been seen in the [place] office and at that point was doing really quite well with compression wrapping and often alginate are either Hydrofera Blue dressings. Fortunately he has gone since November 2020 until just current with out having any issue or need for wound care services which is great news. Unfortunately he is here today because he is having some issues at this point. Patient does have a history of several medical conditions which include diabetes mellitus type 2, lymphedema, hypertension, congestive heart failure, and cardiac arrhythmia though is not sure that this is actually atrial fibrillation based on what he has been told. He does have lymphedema pumps but tells me he has not used them since the summer when he had to move to a remodel of his building and  subsequently never unpacked them. [name], [name] ([id number]) [id number] Page 6 of 9 07-23-2022 upon evaluation today patient appears to be doing well with regard to his legs. He is going require some sharp debridement today but overall seems to be making some progress. I am pleased in that regard he has much less drainage that he had did last week I think the compression wraps are definitely helping. 07-30-2022 upon evaluation today patient  appears to be doing poorly in regard to his legs he actually has blue-green drainage which I think is consistent with Pseudomonas. I believe that we need to address this as soon as possible and I discussed that with the patient today as well. Fortunately I do not see any signs of active infection locally nor systemically at this time which is great news. No fevers, chills, nausea, vomiting, or diarrhea. 08-13-2022 upon evaluation today patient appears to be doing well currently in regard to his leg ulcers which are actually looking much better. Fortunately there does not appear to be any signs of active infection at this time which is great news. No fevers, chills, nausea, vomiting, or diarrhea. 3/7; patient with predominantly a left medial lower extremity wound as well as several smaller areas and a small area on the right lateral. His Jodie Echevaria came in today. We are using silver alginate as the primary dressing 09-03-2022 upon evaluation today patient appears to be doing well currently in regard to his wounds. He has been tolerating the dressing changes without complication. Fortunately there does not appear to be any signs of infection there is needed however for sharp debridement. 09-10-2022 upon evaluation today patient appears to be doing well currently in regard to his wound. Has been tolerating the dressing changes without complication. Fortunately there does not appear to be any signs of infection looking systemically which is great news and overall I am extremely pleased with where we stand today. I do not see any signs of infection. 09-17-2022 upon evaluation today patient actually is making signs of improvement the good news is he is making good improvement. With that being said he still is having quite a time keeping the swelling down. We have been using Tubigrip I think that still probably our best option but nonetheless I think that we are making progress with regard to his wounds. 09-24-2022  upon evaluation today patient appears to be doing well currently in regard to his wounds is continuing to make improvements at this point which is great news and overall I am extremely pleased with where we stand today. Fortunately I do not see any evidence of active infection at this time which is great news and in general I think he is doing quite well with the [facility] and silver alginate. 10-01-2022 upon evaluation today patient appears to be doing pretty well currently in regard to his wounds. He has been tolerating the dressing changes without complication. Fortunately I do not see any evidence of active infection locally nor systemically which is great news and I do believe that 10-08-2022 upon evaluation today patient's wounds actually are showing some signs of improvement which is great news. Still this is very slow to heal I really feel like he would do better if we can get him in some stronger compression wrapping he does have the juxta lite compression wraps therefore we can actually see about doing that over top of his Tubigrip to see if that can be beneficial. He does not love the hide he had as they are  not the most comfortable thing for him but nonetheless I think it would be beneficial he is in agreement with going forward with this. 10-15-2022 upon evaluation today patient appears to be doing well currently in regard to his wounds that do not appear to be infected which is good news. With that being said unfortunately he still continues to have significant amount of swelling we really need to do something to try to get the swelling under control. I do not see any signs of infection locally nor systemically but at the same time the amount of edema is tremendous. The juxta lite helps but again he is only taking the fluid pills once or twice a week due to the amount that makes him go to the restroom. Therefore he does not take this daily which is really what he needs on a more regular basis.  We need to try to get some compression on and is a little bit stronger. 10-22-2022 upon evaluation today patient actually appears to be making some really good progress in regard to his wound. The compression wrap was put on last time actually doing a great job he looks much improved and in general I feel like that we are making great progress. I do not see any evidence of active infection locally nor systemically which is great news. 10-29-2022 upon evaluation today patient appears to be doing decently well in regard to his legs currently. I feel like that his swelling is much better I feel like that he is also doing much better in regard to the wounds in general. I do not see any signs of active infection which is great news and in general I think that he is making excellent progress towards complete resolution. The patient does seem to have 1 issue that is with his wraps actually slipping down working I definitely need to work on that aspect. 11-05-2022 upon evaluation today patient appears to be doing excellent in regard to his wound. He has been tolerating the dressing changes without complication. He actually seems to be making excellent progress I am extremely pleased with where things stand currently. I do believe that we are really making good progress and I think that we are on the right track towards complete closure I think the compression wrap has been a dramatic shift in a good way for him as far as getting the wounds healed a lot of these areas that we will continue to leave Completely sealed up. 11-12-2022 upon evaluation today patient appears to be doing well currently in regard to his leg ulcer. The compression wrapping seems to be doing an excellent job. Fortunately I do not see any evidence of active infection locally nor systemically which is great news and in general I do believe that we are making good headway here towards complete closure. 11-19-2022 upon evaluation today patient  appears to be doing well currently in regard to his wounds. He is actually showing signs of excellent improvement and very pleased with where we stand I think that he is making great progress. I do not see any evidence of infection at this time which is great news. 11-26-2022 upon evaluation today patient appears to be doing well currently in regard to his wound. He is actually been tolerating the dressing changes without complication. Fortunately I do not see any evidence of active infection locally nor systemically which is great news I think he is making excellent progress here towards closure. 12-03-2022 upon evaluation today patient appears to be  doing well currently in regard to his wound. He in fact at both locations is doing great and seems to be making excellent progress and very pleased in that regard. I do not see any signs of active infection at this time. 12-10-2022 upon evaluation patient's wound is actually significantly smaller with one of the satellite lesions closed and there is just 1 small area remaining and this is actually doing quite well. I am very pleased with her progress. 12-15-2022 upon evaluation today patient appears to be doing actually pretty well in regard to his wound on the left leg. He does have a little area on the backside that open that was previously close last week due to the fact that his wrap got wet over the weekend and he had to make do with stuff they can find at the drugstore. They made this work out however and the good news is he actually seems to be doing significantly better and the wound looks fine except for the fact that he has a couple of areas that just reopen on the backside very tiny. Nonetheless I do believe that we can go ahead and see what we do about trying to get this moving in the right direction yet again. 12-22-2022 upon evaluation today patient appears to be doing poorly in regard to his wrap slid down and his leg is extremely swollen. It is  also painful secondary to it being so swollen. Fortunately I do not see any evidence of infection right now but at the same time I do believe that if we do not get the swelling under control is not really good to alleviate his discomfort. We definitely can need to change his wrap out however in short order. 12-31-2022 upon evaluation today patient appears to be doing well currently in regard to his wound. He has been tolerating the dressing changes without complication. This actually looks to be doing much better today compared to where we were. Fortunately I do not see any evidence of active infection locally or systemically which is great news. 01-11-2023 upon evaluation today patient appears to be doing well currently in regard to his wound which is actually showing signs of significant improvement. Fortunately I do not see any signs of infection locally or systemically which is great news and in general I do believe that we are making good headway towards complete closure. [name], [name] ([id number]) [id number] Page 7 of 9 01-19-2023 upon evaluation today patient appears to be doing well currently hemoglobin to his wound. He has been tolerating the dressing changes without complication. Fortunately the wound is not infected unfortunately it is a little bigger due to the fact that the wrap slid down. I do not see any signs of worsening or infection at this time. Objective Constitutional Well-nourished and well-hydrated in no acute distress. Vitals Time Taken: 1:04 PM, Height: 73 in, Weight: 358 lbs, BMI: 47.2, Temperature: 98.1 F, Pulse: 91 bpm, Respiratory Rate: 18 breaths/min, Blood Pressure: 142/84 mmHg. Respiratory normal breathing without difficulty. Psychiatric this patient is able to make decisions and demonstrates good insight into disease process. Alert and Oriented x 3. pleasant and cooperative. General Notes: Upon inspection patient's wound bed actually  showed signs of good granulation physician at this point. Fortunately I do not see any signs of worsening at this time and in general I do think that you are making good headway towards complete closure which is great news. Integumentary (Hair, Skin) Wound #2 status is Open. Original cause of wound  was Gradually Appeared. The date acquired was: 06/15/2022. The wound has been in treatment 26 weeks. The wound is located on the Left,Medial Lower Leg. The wound measures 2.3cm length x 2.8cm width x 0.1cm depth; 5.058cm^2 area and 0.506cm^3 volume. There is Fat Layer (Subcutaneous Tissue) exposed. There is no tunneling or undermining noted. There is a medium amount of serosanguineous drainage noted. There is large (67-100%) pink, pale granulation within the wound bed. There is a small (1-33%) amount of necrotic tissue within the wound bed. 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 There was a Double Layer Compression Therapy Procedure by Angelina Pih, RN. Post procedure Diagnosis Wound #2: Same as Pre-Procedure Plan Follow-up Appointments: Return Appointment in 1 week. Bathing/ Shower/ Hygiene: May shower with wound dressing protected with water repellent cover or cast protector. Anesthetic (Use 'Patient Medications' Section for Anesthetic Order Entry): Lidocaine applied to wound bed Edema Control - Lymphedema / Segmental Compressive Device / Other: Optional: One layer of unna paste to top of  compression wrap (to act as an anchor). - please use to try and keep wrap up. Elevate, Exercise Daily and Avoid Standing for Long Periods of Time. Elevate legs to the level of the heart and pump ankles as often as possible Elevate leg(s) parallel to the floor when sitting. Compression Pump: Use compression pump on left lower extremity for 60 minutes, twice daily. Compression Pump: Use compression pump on right lower extremity for 60 minutes, twice daily. DO YOUR BEST to sleep in the bed at night. DO NOT sleep in your recliner. Long hours of sitting in a recliner leads to swelling of the legs and/or potential [name], [name] ([id number]) [id number] Page 8 of 9 wounds on your backside. WOUND #2: - Lower Leg Wound Laterality: Left, Medial Cleanser: Soap and Water 1 x Per Week/30 Days Discharge Instructions: Gently cleanse wound with antibacterial soap, rinse and pat dry prior to dressing wounds Peri-Wound Care: AandD Ointment 1 x Per Week/30 Days Discharge Instructions: Apply AandD Ointment to leg Prim Dressing: Aquacel Extra Hydrofiber Dressing, 2x2 (in/in) 1 x Per Week/30 Days ary Secondary Dressing: ABD Pad 5x9 (in/in) 1 x Per Week/30 Days Discharge Instructions: Cover with ABD pad Com pression Wrap: Urgo K2 Lite, two layer compression system, large 1 x Per Week/30 Days 1. Based on what I am seeing I do believe that the patient would benefit from continuing with a compression wrap which is send make sure that it goes quite high enough. 2. Muscle can recommend patient should continue with the AandD ointment and leg. 3. Him and recommended to continue with Urgo K2 lite compression wrap which seems to be doing quite well. We are using the Aquacel as a primary dressing of choice. We will see patient back for reevaluation in 1 week here in the clinic. If anything worsens or changes patient will contact our office for additional recommendations. Electronic  Signature(s) Signed: 01/19/2023 1:49:24 PM By: Allen Derry PA-C Entered By: Allen Derry on 01/19/2023 13:49:24 -------------------------------------------------------------------------------- SuperBill Details Patient Name: Date of Service: [name], Matthew [name] 01/19/2023 Medical Record  Number: [id number] Patient Account Number: [id number] Date of Birth/Sex: Treating RN: 1956/02/15 (67 y.o. [name] Primary Care Provider: [name] Other Clinician: Referring Provider: Treating Provider/Extender: Matthew Folks in Treatment: 26 Diagnosis Coding ICD-10 Codes Code Description E11.622 Type 2 diabetes mellitus with other skin ulcer I89.0 Lymphedema, not elsewhere classified L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 L97.812 Non-pressure chronic ulcer of other part of right lower leg with fat layer exposed I10 Essential (primary) hypertension I50.42 Chronic combined systolic (congestive) and diastolic (congestive) heart failure I49.9 Cardiac arrhythmia, unspecified Facility Procedures : CPT4 Code: [id number] Description: (Facility Use Only) 29581LT - APPLY MULTLAY COMPRS LWR LT LEG Modifier: Quantity: 1 Physician Procedures : CPT4 Code Description Modifier [id number] - WC PHYS LEVEL 3 - EST PT ICD-10 Diagnosis Description E11.622 Type 2 diabetes mellitus with other skin ulcer I89.0 Lymphedema, not elsewhere classified L97.822 Non-pressure chronic ulcer of other part of  left lower leg with fat layer exposed L97.812 Non-pressure chronic ulcer of other part of right lower leg with fat layer exposed DELVONTA, ROOTES ([id number]) [phone].pdf Page 9 of Quantity: 1 9 Electronic Signature(s) Unsigned Previous Signature: 01/19/2023 1:49:38 PM Version By: Allen Derry PA-C Entered By: Angelina Pih on 01/19/2023 13:54:20 Signature(s): Date(s):

## 2023-01-19 NOTE — Progress Notes (Signed)
ERIE, HECKMAN ([id number]) [id number] Page 1 of 9 Visit Report for 01/19/2023 Arrival Information Details Patient Name: Date of Service: [name] 01/19/2023 1:00 PM Medical Record Number: [id number] Patient Account Number: [id number] Date of Birth/Sex: Treating RN: Jul 01, 1955 (67 y.o. [name] Primary Care Jonanthony Nahar: [name] Other Clinician: Referring Sairah Knobloch: Treating Bitha Fauteux/Extender: Matthew Folks in Treatment: 26 Visit Information History Since Last Visit Added or deleted any medications: No Patient Arrived: [name] Any new allergies or adverse reactions: No Arrival Time: 12:59 Had a fall or experienced change in No Accompanied By: self activities of daily living that may affect Transfer Assistance: None risk of falls: Patient Identification Verified: Yes Hospitalized since last visit: No Secondary Verification Process Completed: Yes Has Dressing in Place as Prescribed: Yes Patient Requires Transmission-Based Precautions: No Has Compression in Place as Prescribed: Yes Patient Has Alerts: No Pain Present Now: No Electronic Signature(s) Signed: 01/19/2023 4:18:36 PM By: Angelina Pih Entered By: Angelina Pih on 01/19/2023 13:04:44 -------------------------------------------------------------------------------- Clinic Level of Care Assessment Details Patient Name: Date of Service: [name] 01/19/2023 1:00 PM Medical Record Number: [id number] Patient Account Number: [id number] Date of Birth/Sex: Treating RN: 12/13/1955 (67 y.o. [name] Primary Care Destaney Sarkis: [name] Other Clinician: Referring Shameria Trimarco: Treating Kenton Fortin/Extender: Matthew Folks in Treatment: 26 Clinic Level of Care Assessment Items TOOL 1 Quantity Score []  - 0 Use when EandM and Procedure is performed on INITIAL visit ASSESSMENTS - Nursing Assessment / Reassessment []  - 0 General  Physical Exam (combine w/ comprehensive assessment (listed just below) when performed on new pt. evals) []  - 0 Comprehensive Assessment (HX, ROS, Risk Assessments, Wounds Hx, etc.) ASSESSMENTS - Wound and Skin Assessment / Reassessment []  - 0 Dermatologic / Skin Assessment (not related to wound area) [name], [name] ([id number]) [id number] Page 2 of 9 ASSESSMENTS - Ostomy and/or Continence Assessment and Care []  - 0 Incontinence Assessment and Management []  - 0 Ostomy Care Assessment and Management (repouching, etc.) PROCESS - Coordination of Care []  - 0 Simple Patient / Family Education for ongoing care []  - 0 Complex (extensive) Patient / Family Education for ongoing care []  - 0 Staff obtains Chiropractor, Records, T Results / Process Orders est []  - 0 Staff telephones HHA, Nursing Homes / Clarify orders / etc []  - 0 Routine Transfer to another Facility (non-emergent condition) []  - 0 Routine Hospital Admission (non-emergent condition) []  - 0 New Admissions / Manufacturing engineer / Ordering NPWT Apligraf, etc. , []  - 0 Emergency Hospital Admission (emergent condition) PROCESS - Special Needs []  - 0 Pediatric / Minor Patient Management []  - 0 Isolation Patient Management []  - 0 Hearing / Language / Visual special needs []  - 0 Assessment of Community assistance (transportation, D/C planning, etc.) []  - 0 Additional assistance / Altered mentation []  - 0 Support Surface(s) Assessment (bed, cushion, seat, etc.) INTERVENTIONS - Miscellaneous []  - 0 External ear exam []  - 0 Patient Transfer (multiple staff / Nurse, adult / Similar devices) []  - 0 Simple Staple / Suture removal (25 or less) []  - 0 Complex Staple / Suture removal (26 or more) []  - 0 Hypo/Hyperglycemic Management (do not check if billed separately) []  - 0 Ankle / Brachial Index (ABI) - do not check if billed separately Has the patient been seen at the hospital within the last  three years: Yes Total Score: 0 Level Of Care: ____ Electronic Signature(s) Signed: 01/19/2023 4:18:36 PM By: Angelina Pih Entered By:  Angelina Pih on 01/19/2023 13:54:06 -------------------------------------------------------------------------------- Compression Therapy Details Patient Name: Date of Service: [name] 01/19/2023 1:00 PM Medical Record Number: [id number] Patient Account Number: [id number] Date of Birth/Sex: Treating RN: [birth year] (67 y.o. [name] Primary Care Anissia Wessells: [name] Other Clinician: Referring Calle Schader: Treating Gaynor Genco/Extender: Matthew Folks in Treatment: 26 Compression Therapy Performed for Wound Assessment: Wound #2 Left,Medial Lower Leg Performed By: Holly Bodily, RN Compression Type: Double Shelby Dubin ([id number]) [id number] Page 3 of 9 Post Procedure Diagnosis Same as Pre-procedure Electronic Signature(s) Signed: 01/19/2023 4:18:36 PM By: Angelina Pih Entered By: Angelina Pih on 01/19/2023 13:38:41 -------------------------------------------------------------------------------- Encounter Discharge Information Details Patient Name: Date of Service: [name], Matthew [name] 01/19/2023 1:00 PM Medical Record Number: [id number] Patient Account Number: [id number] Date of Birth/Sex: Treating RN: 04/14/1956 (67 y.o. [name] Primary Care Teion Ballin: [name] Other Clinician: Referring Insiya Oshea: Treating Johneisha Broaden/Extender: Matthew Folks in Treatment: 26 Encounter Discharge Information Items Discharge Condition: Stable Ambulatory Status: Cane Discharge Destination: Home Transportation: Private Auto Accompanied By: self Schedule Follow-up Appointment: Yes Clinical Summary of Care: Electronic Signature(s) Signed: 01/19/2023 4:18:36 PM By: Angelina Pih Entered By: Angelina Pih on 01/19/2023  13:55:44 -------------------------------------------------------------------------------- Lower Extremity Assessment Details Patient Name: Date of Service: [name] 01/19/2023 1:00 PM Medical Record Number: [id number] Patient Account Number: [id number] Date of Birth/Sex: Treating RN: August 02, 1955 (67 y.o. [name] Primary Care Sihaam Chrobak: [name] Other Clinician: Referring Preslie Depasquale: Treating Averie Hornbaker/Extender: Matthew Folks in Treatment: 26 Edema Assessment Assessed: [Left: No] [Right: No] Edema: [Left: Ye] [Right: s] Calf [name], [name] ([id number]) [phone].pdf Page 4 of 9 Left: Right: Point of Measurement: 35 cm From Medial Instep 55.5 cm Ankle Left: Right: Point of Measurement: 10 cm From Medial Instep 30.5 cm Vascular Assessment Pulses: Dorsalis Pedis Palpable: [Left:Yes] Extremity colors, hair growth, and conditions: Extremity Color: [Left:Hyperpigmented] Hair Growth on Extremity: [Left:No] Temperature of Extremity: [Left:Warm < 3 seconds] Toe Nail Assessment Left: Right: Thick: Yes Discolored: Yes Deformed: Yes Improper Length and Hygiene: Yes Electronic Signature(s) Signed: 01/19/2023 4:18:36 PM By: Angelina Pih Entered By: Angelina Pih on 01/19/2023 13:14:53 -------------------------------------------------------------------------------- Multi Wound Chart Details Patient Name: Date of Service: Matthew Burly [name] 01/19/2023 1:00 PM Medical Record Number: [id number] Patient Account Number: [id number] Date of Birth/Sex: Treating RN: 08-13-1955 (67 y.o. [name] Primary Care Jhan Conery: [name] Other Clinician: Referring Ambermarie Honeyman: Treating Kamen Hanken/Extender: Matthew Folks in Treatment: 26 Vital Signs Height(in): 73 Pulse(bpm): 91 Weight(lbs): 358 Blood Pressure(mmHg): 142/84 Body Mass Index(BMI): 47.2 Temperature(F): 98.1 Respiratory Rate(breaths/min):  18 [2:Photos:] [N/A:N/A] Left, Medial Lower Leg N/A N/A Wound Location: Gradually Appeared N/A N/A Wounding Event: Venous Leg Ulcer N/A N/A Primary Etiology: GABREAL, HOWICK ([id number]) [phone].pdf Page 5 of 9 Diabetic Wound/Ulcer of the Lower N/A N/A Secondary Etiology: Extremity Arrhythmia, Congestive Heart Failure, N/A N/A Comorbid History: Hypertension, Type II Diabetes 06/15/2022 N/A N/A Date Acquired: 85 N/A N/A Weeks of Treatment: Open N/A N/A Wound Status: No N/A N/A Wound Recurrence: 2.3x2.8x0.1 N/A N/A Measurements L x W x D (cm) 5.058 N/A N/A A (cm) : rea 0.506 N/A N/A Volume (cm) : 99.20% N/A N/A % Reduction in Area: 99.20% N/A N/A % Reduction in Volume: Full Thickness Without Exposed N/A N/A Classification: Support Structures Medium N/A N/A Exudate Amount: Serosanguineous N/A N/A Exudate Type: red, brown N/A N/A Exudate Color: Large (67-100%) N/A N/A Granulation Amount: Pink, Pale N/A N/A Granulation  Quality: Small (1-33%) N/A N/A Necrotic Amount: Fat Layer (Subcutaneous Tissue): Yes N/A N/A Exposed Structures: Fascia: No Tendon: No Muscle: No Joint: No Bone: No None N/A N/A Epithelialization: Treatment Notes Electronic Signature(s) Signed: 01/19/2023 4:18:36 PM By: Angelina Pih Entered By: Angelina Pih on 01/19/2023 13:16:07 -------------------------------------------------------------------------------- Multi-Disciplinary Care Plan Details Patient Name: Date of Service: Matthew Burly [name] 01/19/2023 1:00 PM Medical Record Number: [id number] Patient Account Number: [id number] Date of Birth/Sex: Treating RN: [birth year] (67 y.o. [name] Primary Care Jamol Ginyard: [name] Other Clinician: Referring Samina Weekes: Treating Levester Waldridge/Extender: Matthew Folks in Treatment: 26 Active Inactive Venous Leg Ulcer Nursing Diagnoses: Actual venous Insuffiency (use after diagnosis is  confirmed) Goals: Patient will maintain optimal edema control Date Initiated: 10/15/2022 Target Resolution Date: 02/09/2023 Goal Status: Active Patient/caregiver will verbalize understanding of disease process and disease management Date Initiated: 10/15/2022 Date Inactivated: 11/12/2022 Target Resolution Date: 10/15/2022 Goal Status: Met Verify adequate tissue perfusion prior to therapeutic compression application Date Initiated: 10/15/2022 Date Inactivated: 11/12/2022 Target Resolution Date: 10/15/2022 Goal Status: Met NASSER, BLATZ ([id number]) [phone].pdf Page 6 of 9 Interventions: Assess peripheral edema status every visit. Compression as ordered Provide education on venous insufficiency Treatment Activities: Therapeutic compression applied : 10/15/2022 Notes: Electronic Signature(s) Signed: 01/19/2023 4:18:36 PM By: Angelina Pih Entered By: Angelina Pih on 01/19/2023 13:54:34 -------------------------------------------------------------------------------- Pain Assessment Details Patient Name: Date of Service: Matthew Burly [name] 01/19/2023 1:00 PM Medical Record Number: [id number] Patient Account Number: [id number] Date of Birth/Sex: Treating RN: 23-Dec-1955 (67 y.o. [name] Primary Care Taysha Majewski: [name] Other Clinician: Referring Aryon Nham: Treating Urijah Raynor/Extender: Matthew Folks in Treatment: 26 Active Problems Location of Pain Severity and Description of Pain Patient Has Paino Yes Site Locations Pain Management and Medication Current Pain Management: Notes pt states pain in right heel , not at wound site Electronic Signature(s) Signed: 01/19/2023 4:18:36 PM By: Angelina Pih Entered By: Angelina Pih on 01/19/2023 13:05:23 [name] ([id number]) [id number] Page 7 of 9 -------------------------------------------------------------------------------- Patient/Caregiver  Education Details Patient Name: Date of Service: [name] 8/6/2024andnbsp1:00 PM Medical Record Number: [id number] Patient Account Number: [id number] Date of Birth/Gender: Treating RN: [birth year] (67 y.o. [name] Primary Care Physician: [name] Other Clinician: Referring Physician: Treating Physician/Extender: Matthew Folks in Treatment: 26 Education Assessment Education Provided To: Patient Education Topics Provided Wound/Skin Impairment: Handouts: Caring for Your Ulcer Methods: Explain/Verbal Responses: State content correctly Electronic Signature(s) Signed: 01/19/2023 4:18:36 PM By: Angelina Pih Entered By: Angelina Pih on 01/19/2023 13:54:45 -------------------------------------------------------------------------------- Wound Assessment Details Patient Name: Date of Service: Matthew Burly [name] 01/19/2023 1:00 PM Medical Record Number: [id number] Patient Account Number: [id number] Date of Birth/Sex: Treating RN: 1955-12-10 (67 y.o. [name] Primary Care Korrie Hofbauer: [name] Other Clinician: Referring Kristi Hyer: Treating Lyndsay Talamante/Extender: Matthew Folks in Treatment: 26 Wound Status Wound Number: 2 Primary Etiology: Venous Leg Ulcer Wound Location: Left, Medial Lower Leg Secondary Diabetic Wound/Ulcer of the Lower Extremity Etiology: Wounding Event: Gradually Appeared Wound Status: Open Date Acquired: 06/15/2022 Comorbid Arrhythmia, Congestive Heart Failure, Hypertension, Type Weeks Of Treatment: 26 History: II Diabetes Clustered Wound: No Photos [name], [name] ([id number]) [phone].pdf Page 8 of 9 Wound Measurements Length: (cm) 2.3 Width: (cm) 2.8 Depth: (cm) 0.1 Area: (cm) 5.058 Volume: (cm) 0.506 % Reduction in Area: 99.2% % Reduction in Volume: 99.2% Epithelialization: None Tunneling: No Undermining: No Wound Description Classification: Full  Thickness Without Exposed Suppor Exudate Amount: Medium Exudate  Type: Serosanguineous Exudate Color: red, brown t Structures Foul Odor After Cleansing: No Slough/Fibrino Yes Wound Bed Granulation Amount: Large (67-100%) Exposed Structure Granulation Quality: Pink, Pale Fascia Exposed: No Necrotic Amount: Small (1-33%) Fat Layer (Subcutaneous Tissue) Exposed: Yes Tendon Exposed: No Muscle Exposed: No Joint Exposed: No Bone Exposed: No Treatment Notes Wound #2 (Lower Leg) Wound Laterality: Left, Medial Cleanser Soap and Water Discharge Instruction: Gently cleanse wound with antibacterial soap, rinse and pat dry prior to dressing wounds Peri-Wound Care AandD Ointment Discharge Instruction: Apply AandD Ointment to leg Desitin Maximum Strength Ointment 4 (oz) Discharge Instruction: Apply around wound edges to avoid maceration Topical Primary Dressing Aquacel Extra Hydrofiber Dressing, 2x2 (in/in) Secondary Dressing ABD Pad 5x9 (in/in) Discharge Instruction: Cover with ABD pad Secured With Compression Wrap Urgo K2 Lite, two layer compression system, large Compression Stockings Add-Ons Electronic Signature(s) Signed: 01/19/2023 4:18:36 PM By: Angelina Pih Entered By: Angelina Pih on 01/19/2023 13:13:48 [name] ([id number]) [id number] Page 9 of 9 -------------------------------------------------------------------------------- Vitals Details Patient Name: Date of Service: [name] 01/19/2023 1:00 PM Medical Record Number: [id number] Patient Account Number: [id number] Date of Birth/Sex: Treating RN: 19-Aug-1955 (67 y.o. [name] Primary Care Yuna Pizzolato: [name] Other Clinician: Referring Napolean Sia: Treating Swanson Farnell/Extender: Matthew Folks in Treatment: 26 Vital Signs Time Taken: 13:04 Temperature (F): 98.1 Height (in): 73 Pulse (bpm): 91 Weight (lbs): 358 Respiratory Rate (breaths/min):  18 Body Mass Index (BMI): 47.2 Blood Pressure (mmHg): 142/84 Reference Range: 80 - 120 mg / dl Electronic Signature(s) Signed: 01/19/2023 4:18:36 PM By: Angelina Pih Entered By: Angelina Pih on 01/19/2023 13:05:03

## 2023-01-24 DIAGNOSIS — Z79899 Other long term (current) drug therapy: Secondary | ICD-10-CM

## 2023-01-24 DIAGNOSIS — I5043 Acute on chronic combined systolic (congestive) and diastolic (congestive) heart failure: Secondary | ICD-10-CM

## 2023-01-24 DIAGNOSIS — I493 Ventricular premature depolarization: Secondary | ICD-10-CM

## 2023-01-27 ENCOUNTER — Encounter: Payer: Self-pay | Admitting: Internal Medicine

## 2023-01-28 ENCOUNTER — Encounter: Payer: PPO | Admitting: Physician Assistant

## 2023-01-28 DIAGNOSIS — E11622 Type 2 diabetes mellitus with other skin ulcer: Secondary | ICD-10-CM | POA: Diagnosis not present

## 2023-01-28 DIAGNOSIS — L97822 Non-pressure chronic ulcer of other part of left lower leg with fat layer exposed: Secondary | ICD-10-CM | POA: Diagnosis not present

## 2023-01-28 DIAGNOSIS — I872 Venous insufficiency (chronic) (peripheral): Secondary | ICD-10-CM | POA: Diagnosis not present

## 2023-01-28 NOTE — Progress Notes (Addendum)
YE, ROCCHI ([id number]) [id number] Page 1 of 9 Visit Report for 01/28/2023 Arrival Information Details Patient Name: Date of Service: [name] 01/28/2023 1:00 PM Medical Record Number: [id number] Patient Account Number: [id number] Date of Birth/Sex: Treating RN: Jan 15, 1956 (67 y.o. Barnett Abu, Leah Primary Care Andrick Rust: [name] Other Clinician: Referring Jevaun Strick: Treating Amiria Orrison/Extender: Matthew Folks in Treatment: 28 Visit Information History Since Last Visit All ordered tests and consults were completed: No Patient Arrived: [name] Added or deleted any medications: No Arrival Time: 12:50 Any new allergies or adverse reactions: No Accompanied By: self Hospitalized since last visit: No Transfer Assistance: None Pain Present Now: No Patient Identification Verified: Yes Secondary Verification Process Completed: Yes Patient Requires Transmission-Based Precautions: No Patient Has Alerts: No Electronic Signature(s) Signed: 01/29/2023 1:41:07 PM By: Bonnell Public Entered By: Bonnell Public on 01/28/2023 12:52:06 -------------------------------------------------------------------------------- Clinic Level of Care Assessment Details Patient Name: Date of Service: [name] 01/28/2023 1:00 PM Medical Record Number: [id number] Patient Account Number: [id number] Date of Birth/Sex: Treating RN: [birth year] (67 y.o. Barnett Abu, Leah Primary Care Hercules Hasler: [name] Other Clinician: Referring Evanna Washinton: Treating Jaquarious Grey/Extender: Matthew Folks in Treatment: 28 Clinic Level of Care Assessment Items TOOL 4 Quantity Score []  - 0 Use when only an EandM is performed on FOLLOW-UP visit ASSESSMENTS - Nursing Assessment / Reassessment X- 1 10 Reassessment of Co-morbidities (includes updates in patient status) X- 1 5 Reassessment of Adherence to Treatment Plan ASSESSMENTS - Wound and Skin A  ssessment / Reassessment X - Simple Wound Assessment / Reassessment - one wound 1 5 []  - 0 Complex Wound Assessment / Reassessment - multiple wounds OBRIAN, BATTISTELLI ([id number]) [id number] Page 2 of 9 []  - 0 Dermatologic / Skin Assessment (not related to wound area) ASSESSMENTS - Focused Assessment []  - 0 Circumferential Edema Measurements - multi extremities []  - 0 Nutritional Assessment / Counseling / Intervention []  - 0 Lower Extremity Assessment (monofilament, tuning fork, pulses) []  - 0 Peripheral Arterial Disease Assessment (using hand held doppler) ASSESSMENTS - Ostomy and/or Continence Assessment and Care []  - 0 Incontinence Assessment and Management []  - 0 Ostomy Care Assessment and Management (repouching, etc.) PROCESS - Coordination of Care X - Simple Patient / Family Education for ongoing care 1 15 []  - 0 Complex (extensive) Patient / Family Education for ongoing care X- 1 10 Staff obtains Chiropractor, Records, T Results / Process Orders est []  - 0 Staff telephones HHA, Nursing Homes / Clarify orders / etc []  - 0 Routine Transfer to another Facility (non-emergent condition) []  - 0 Routine Hospital Admission (non-emergent condition) []  - 0 New Admissions / Manufacturing engineer / Ordering NPWT Apligraf, etc. , []  - 0 Emergency Hospital Admission (emergent condition) X- 1 10 Simple Discharge Coordination []  - 0 Complex (extensive) Discharge Coordination PROCESS - Special Needs []  - 0 Pediatric / Minor Patient Management []  - 0 Isolation Patient Management []  - 0 Hearing / Language / Visual special needs []  - 0 Assessment of Community assistance (transportation, D/C planning, etc.) []  - 0 Additional assistance / Altered mentation []  - 0 Support Surface(s) Assessment (bed, cushion, seat, etc.) INTERVENTIONS - Wound Cleansing / Measurement X - Simple Wound Cleansing - one wound 1 5 []  - 0 Complex Wound Cleansing - multiple  wounds X- 1 5 Wound Imaging (photographs - any number of wounds) []  - 0 Wound Tracing (instead of photographs) X- 1 5 Simple Wound Measurement - one wound []  - 0  Complex Wound Measurement - multiple wounds INTERVENTIONS - Wound Dressings []  - 0 Small Wound Dressing one or multiple wounds X- 1 15 Medium Wound Dressing one or multiple wounds []  - 0 Large Wound Dressing one or multiple wounds []  - 0 Application of Medications - topical []  - 0 Application of Medications - injection INTERVENTIONS - Miscellaneous []  - 0 External ear exam []  - 0 Specimen Collection (cultures, biopsies, blood, body fluids, etc.) []  - 0 Specimen(s) / Culture(s) sent or taken to Lab for analysis MAZEN, PHO ([id number]) [phone] Page 3 of 9 []  - 0 Patient Transfer (multiple staff / Nurse, adult / Similar devices) []  - 0 Simple Staple / Suture removal (25 or less) []  - 0 Complex Staple / Suture removal (26 or more) []  - 0 Hypo / Hyperglycemic Management (close monitor of Blood Glucose) []  - 0 Ankle / Brachial Index (ABI) - do not check if billed separately X- 1 5 Vital Signs Has the patient been seen at the hospital within the last three years: Yes Total Score: 90 Level Of Care: New/Established - Level 3 Electronic Signature(s) Signed: 01/29/2023 1:41:07 PM By: Bonnell Public Entered By: Bonnell Public on 01/28/2023 13:18:49 -------------------------------------------------------------------------------- Encounter Discharge Information Details Patient Name: Date of Service: [name] 01/28/2023 1:00 PM Medical Record Number: [id number] Patient Account Number: [id number] Date of Birth/Sex: Treating RN: [birth year] (67 y.o. Barnett Abu, Leah Primary Care Delawrence Fridman: [name] Other Clinician: Referring Karthik Whittinghill: Treating Doxie Augenstein/Extender: Matthew Folks in Treatment: 28 Encounter Discharge Information Items Discharge Condition:  Stable Ambulatory Status: Cane Discharge Destination: Home Transportation: Private Auto Schedule Follow-up Appointment: Yes Clinical Summary of Care: Electronic Signature(s) Signed: 01/29/2023 1:41:07 PM By: Bonnell Public Entered By: Bonnell Public on 01/28/2023 13:20:07 -------------------------------------------------------------------------------- Lower Extremity Assessment Details Patient Name: Date of Service: [name] 01/28/2023 1:00 PM Medical Record Number: [id number] Patient Account Number: [id number] Date of Birth/Sex: Treating RN: 22-Jul-1955 (67 y.o. Barnett Abu, Leah Primary Care Terran Hollenkamp: [name] Other Clinician: Referring Mikael Skoda: Treating Tashiya Souders/Extender: Matthew Folks in Treatment: [address] [place] ([id number]) [id number] Page 4 of 9 Edema Assessment Assessed: [Left: No] [Right: No] [Left: Edema] [Right: :] Calf Left: Right: Point of Measurement: 37 cm From Medial Instep 55 cm Ankle Left: Right: Point of Measurement: 10 cm From Medial Instep 29.5 cm Vascular Assessment Pulses: Dorsalis Pedis Palpable: [Left:Yes] Extremity colors, hair growth, and conditions: Hair Growth on Extremity: [Left:No] Temperature of Extremity: [Left:Warm] Capillary Refill: [Left:< 3 seconds] Blanched when Elevated: [Left:No Yes] Toe Nail Assessment Left: Right: Thick: Yes Discolored: Yes Deformed: Yes Improper Length and Hygiene: Yes Electronic Signature(s) Signed: 01/29/2023 1:41:07 PM By: Bonnell Public Entered By: Bonnell Public on 01/28/2023 13:04:22 -------------------------------------------------------------------------------- Multi Wound Chart Details Patient Name: Date of Service: [name] 01/28/2023 1:00 PM Medical Record Number: [id number] Patient Account Number: [id number] Date of Birth/Sex: Treating RN: 1956-03-19 (67 y.o. 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/A Secondary Etiology: Extremity Arrhythmia, Congestive Heart Failure, N/A N/A Comorbid History: Hypertension, Type II Diabetes 06/15/2022 N/A N/A Date Acquired: 73 N/A N/A Weeks of  Treatment: Open N/A N/A Wound Status: No N/A N/A Wound Recurrence: 1x0.2x0.1 N/A N/A Measurements L x W x D (cm) 0.157 N/A N/A A (cm) : rea 0.016 N/A N/A Volume (cm) : 100.00% N/A N/A % Reduction in A rea: 100.00% N/A N/A % Reduction in Volume: Partial Thickness N/A N/A Classification: Medium N/A N/A Exudate A mount: Serosanguineous N/A N/A Exudate Type: red, brown N/A N/A Exudate Color: None Present (0%) N/A N/A Granulation A mount: None Present (0%) N/A N/A Necrotic A mount: Fascia: No N/A N/A Exposed Structures: Fat Layer (Subcutaneous Tissue): No Tendon: No Muscle: No Joint: No Bone: No Limited to Skin Breakdown None N/A N/A Epithelialization: Treatment Notes Electronic Signature(s) Signed: 01/29/2023 1:41:07 PM By: Bonnell Public Entered By: Bonnell Public on 01/28/2023 13:07:12 -------------------------------------------------------------------------------- Multi-Disciplinary Care Plan Details Patient Name: Date of Service: [name], [name] 01/28/2023 1:00 PM Medical Record Number: [id number] Patient Account Number: [id number] Date of Birth/Sex: Treating RN: 05/10/1956 (67 y.o. Barnett Abu, Leah Primary Care Tyrick Dunagan: [name] Other Clinician: Referring Kadey Mihalic: Treating  Kamea Dacosta/Extender: Matthew Folks in Treatment: 28 Active Inactive Venous Leg Ulcer Nursing Diagnoses: Actual venous Insuffiency (use after diagnosis is confirmed) Goals: Patient will maintain optimal edema control Date Initiated: 10/15/2022 Target Resolution Date: 02/09/2023 Goal Status: Active DAVEON, JAFARI ([id number]) [phone].pdf Page 6 of 9 Patient/caregiver will verbalize understanding of disease process and disease management Date Initiated: 10/15/2022 Date Inactivated: 11/12/2022 Target Resolution Date: 10/15/2022 Goal Status: Met Verify adequate tissue perfusion prior to therapeutic compression application Date Initiated: 10/15/2022 Date Inactivated: 11/12/2022 Target Resolution Date: 10/15/2022 Goal Status: Met Interventions: Assess peripheral edema status every visit. Compression as ordered Provide education on venous insufficiency Treatment Activities: Therapeutic compression applied : 10/15/2022 Notes: Electronic Signature(s) Signed: 01/29/2023 1:41:07 PM By: Bonnell Public Entered By: Bonnell Public on 01/28/2023 13:19:13 -------------------------------------------------------------------------------- Pain Assessment Details Patient Name: Date of Service: [name] 01/28/2023 1:00 PM Medical Record Number: [id number] Patient Account Number: [id number] Date of Birth/Sex: Treating RN: [birth year] (67 y.o. Barnett Abu, Leah Primary Care Haydn Cush: [name] Other Clinician: Referring Georgine Wiltse: Treating Christpoher Sievers/Extender: Matthew Folks in Treatment: 28 Active Problems Location of Pain Severity and Description of Pain Patient Has Paino No Site Locations Pain Management and Medication Current Pain Management: Electronic Signature(s) Signed: 01/29/2023 1:41:07 PM By: Bonnell Public Entered By: Bonnell Public on 01/28/2023 12:53:32 [name] ([id number]) [id number] Page 7  of 9 -------------------------------------------------------------------------------- Patient/Caregiver Education Details Patient Name: Date of Service: [name] 8/15/2024andnbsp1:00 PM Medical Record Number: [id number] Patient Account Number: [id number] Date of Birth/Gender: Treating RN: 1955-07-10 (67 y.o. Barnett Abu, Leah Primary Care Physician: [name] Other Clinician: Referring Physician: Treating Physician/Extender: Matthew Folks in Treatment: 28 Education Assessment Education Provided To: Patient Education Topics Provided Wound/Skin Impairment: Handouts: Caring for Your Ulcer Methods: Explain/Verbal Responses: State content correctly Electronic Signature(s) Signed: 01/29/2023 1:41:07 PM By: Bonnell Public Entered By: Bonnell Public on 01/28/2023 13:19:32 -------------------------------------------------------------------------------- Wound Assessment Details Patient Name: Date of Service: [name] 01/28/2023 1:00 PM Medical Record Number: [id number] Patient Account Number: [id number] Date of Birth/Sex: Treating RN: Jun 24, 1955 (67 y.o. Barnett Abu, Leah Primary Care Jeneen Doutt: [name] Other Clinician: Referring Nalah Macioce: Treating Cordell Guercio/Extender: Matthew Folks in Treatment: 28 Wound Status Wound Number: 2 Primary Etiology: Venous Leg Ulcer Wound Location: Left, Medial Lower Leg Secondary Diabetic Wound/Ulcer of the Lower Extremity Etiology: Wounding Event: Gradually Appeared Wound Status: Open Date Acquired: 06/15/2022 Comorbid  Arrhythmia, Congestive Heart Failure, Hypertension, Type Weeks Of Treatment: 28 History: II Diabetes Clustered Wound: No Photos BIRCH, SCHETTER ([id number]) [phone].pdf Page 8 of 9 Wound Measurements Length: (cm) 1 Width: (cm) 0.2 Depth: (cm) 0.1 Area: (cm) 0.157 Volume: (cm) 0.016 % Reduction in Area: 100% % Reduction in Volume:  100% Epithelialization: None Wound Description Classification: Partial Thickness Exudate Amount: Medium Exudate Type: Serosanguineous Exudate Color: red, brown Foul Odor After Cleansing: No Slough/Fibrino Yes Wound Bed Granulation Amount: None Present (0%) Exposed Structure Necrotic Amount: None Present (0%) Fascia Exposed: No Fat Layer (Subcutaneous Tissue) Exposed: No Tendon Exposed: No Muscle Exposed: No Joint Exposed: No Bone Exposed: No Limited to Skin Breakdown Treatment Notes Wound #2 (Lower Leg) Wound Laterality: Left, Medial Cleanser Soap and Water Discharge Instruction: Gently cleanse wound with antibacterial soap, rinse and pat dry prior to dressing wounds Peri-Wound Care AandD Ointment Discharge Instruction: Apply AandD Ointment to leg Desitin Maximum Strength Ointment 4 (oz) Discharge Instruction: Apply around wound edges to avoid maceration Topical Primary Dressing Mepitel One Silicone Wound Contact Layer, 2x3 (in/in) Discharge Instruction: Add to wound bed to prevent sticking. Secondary Dressing Gauze Discharge Instruction: As directed: dry, moistened with saline or moistened with Dakins Solution Secured With Compression Wrap Urgo K2 Lite, two layer compression system, large Compression Stockings Add-Ons Electronic Signature(s) Signed: 01/29/2023 1:41:07 PM By: Bonnell Public Entered By: Bonnell Public on 01/28/2023 13:03:00 [name] ([id number]) [id number] Page 9 of 9 -------------------------------------------------------------------------------- Vitals Details Patient Name: Date of Service: [name] 01/28/2023 1:00 PM Medical Record Number: [id number] Patient Account Number: [id number] Date of Birth/Sex: Treating RN: 1955-12-03 (67 y.o. Barnett Abu, Leah Primary Care Latandra Loureiro: [name] Other Clinician: Referring Izaya Netherton: Treating Avriel Kandel/Extender: Matthew Folks in Treatment:  28 Vital Signs Time Taken: 12:52 Temperature (F): 97.8 Height (in): 73 Pulse (bpm): 89 Weight (lbs): 358 Respiratory Rate (breaths/min): 18 Body Mass Index (BMI): 47.2 Blood Pressure (mmHg): 110/72 Reference Range: 80 - 120 mg / dl Electronic Signature(s) Signed: 01/29/2023 1:41:07 PM By: Bonnell Public Entered By: Bonnell Public on 01/28/2023 12:53:18

## 2023-01-28 NOTE — Progress Notes (Addendum)
Matthew, [name] ([id number]) [id number] Page 1 of 9 Visit Report for 01/28/2023 Chief Complaint Document Details Patient Name: Date of Service: [name] 01/28/2023 1:00 PM Medical Record Number: [id number] Patient Account Number: [id number] Date of Birth/Sex: Treating RN: [birth year] (67 y.o. [name] Primary Care Provider: [name] Other Clinician: Referring Provider: Treating Provider/Extender: Matthew Folks in Treatment: 28 Information Obtained from: Patient Chief Complaint Bilateral LE ulcers and lymphedema Electronic Signature(s) Signed: 01/28/2023 12:51:09 PM By: Allen Derry PA-C Entered By: Allen Derry on 01/28/2023 12:51:09 -------------------------------------------------------------------------------- HPI Details Patient Name: Date of Service: [name], Matthew Matthew J. 01/28/2023 1:00 PM Medical Record Number: [id number] Patient Account Number: [id number] Date of Birth/Sex: Treating RN: [birth year] (67 y.o. [name] Primary Care Provider: [name] Other Clinician: Referring Provider: Treating Provider/Extender: Matthew Folks in Treatment: 28 History of Present Illness HPI Description: 07-16-2022 upon evaluation today patient appears to be doing poorly currently in regard to his his legs. The left leg is actually worse than the right. This is a patient that is previously been seen in the [place] office and at that point was doing really quite well with compression wrapping and often alginate are either Hydrofera Blue dressings. Fortunately he has gone since November 2020 until just current with out having any issue or need for wound care services which is great news. Unfortunately he is here today because he is having some issues at this point. Patient does have a history of several medical conditions which include diabetes mellitus type 2, lymphedema, hypertension, congestive heart  failure, and cardiac arrhythmia though is not sure that this is actually atrial fibrillation based on what he has been told. He does have lymphedema pumps but tells me he has not used them since the summer when he had to move to a remodel of his building and subsequently never unpacked them. 07-23-2022 upon evaluation today patient appears to be doing well with regard to his legs. He is going require some sharp debridement today but overall seems to be making some progress. I am pleased in that regard he has much less drainage that he had did last week I think the compression wraps are definitely helping. 07-30-2022 upon evaluation today patient appears to be doing poorly in regard to his legs he actually has blue-green drainage which I think is consistent with Pseudomonas. I believe that we need to address this as soon as possible and I discussed that with the patient today as well. Fortunately I do not see any signs of active infection locally nor systemically at this time which is great news. No fevers, chills, nausea, vomiting, or diarrhea. [name], [name] ([id number]) [id number] Page 2 of 9 08-13-2022 upon evaluation today patient appears to be doing well currently in regard to his leg ulcers which are actually looking much better. Fortunately there does not appear to be any signs of active infection at this time which is great news. No fevers, chills, nausea, vomiting, or diarrhea. 3/7; patient with predominantly a left medial lower extremity wound as well as several smaller areas and a small area on the right lateral. His Jodie Echevaria came in today. We are using silver alginate as the primary dressing 09-03-2022 upon evaluation today patient appears to be doing well currently in regard to his wounds. He has been tolerating the dressing changes without complication. Fortunately there does not appear to be any signs of infection there is needed however for sharp  debridement. 09-10-2022  upon evaluation today patient appears to be doing well currently in regard to his wound. Has been tolerating the dressing changes without complication. Fortunately there does not appear to be any signs of infection looking systemically which is great news and overall I am extremely pleased with where we stand today. I do not see any signs of infection. 09-17-2022 upon evaluation today patient actually is making signs of improvement the good news is he is making good improvement. With that being said he still is having quite a time keeping the swelling down. We have been using Tubigrip I think that still probably our best option but nonetheless I think that we are making progress with regard to his wounds. 09-24-2022 upon evaluation today patient appears to be doing well currently in regard to his wounds is continuing to make improvements at this point which is great news and overall I am extremely pleased with where we stand today. Fortunately I do not see any evidence of active infection at this time which is great news and in general I think he is doing quite well with the [facility] and silver alginate. 10-01-2022 upon evaluation today patient appears to be doing pretty well currently in regard to his wounds. He has been tolerating the dressing changes without complication. Fortunately I do not see any evidence of active infection locally nor systemically which is great news and I do believe that 10-08-2022 upon evaluation today patient's wounds actually are showing some signs of improvement which is great news. Still this is very slow to heal I really feel like he would do better if we can get him in some stronger compression wrapping he does have the juxta lite compression wraps therefore we can actually see about doing that over top of his Tubigrip to see if that can be beneficial. He does not love the hide he had as they are not the most comfortable thing for him but nonetheless  I think it would be beneficial he is in agreement with going forward with this. 10-15-2022 upon evaluation today patient appears to be doing well currently in regard to his wounds that do not appear to be infected which is good news. With that being said unfortunately he still continues to have significant amount of swelling we really need to do something to try to get the swelling under control. I do not see any signs of infection locally nor systemically but at the same time the amount of edema is tremendous. The juxta lite helps but again he is only taking the fluid pills once or twice a week due to the amount that makes him go to the restroom. Therefore he does not take this daily which is really what he needs on a more regular basis. We need to try to get some compression on and is a little bit stronger. 10-22-2022 upon evaluation today patient actually appears to be making some really good progress in regard to his wound. The compression wrap was put on last time actually doing a great job he looks much improved and in general I feel like that we are making great progress. I do not see any evidence of active infection locally nor systemically which is great news. 10-29-2022 upon evaluation today patient appears to be doing decently well in regard to his legs currently. I feel like that his swelling is much better I feel like that he is also doing much better in regard to the wounds in general. I do not see any signs of  active infection which is great news and in general I think that he is making excellent progress towards complete resolution. The patient does seem to have 1 issue that is with his wraps actually slipping down working I definitely need to work on that aspect. 11-05-2022 upon evaluation today patient appears to be doing excellent in regard to his wound. He has been tolerating the dressing changes without complication. He actually seems to be making excellent progress I am extremely  pleased with where things stand currently. I do believe that we are really making good progress and I think that we are on the right track towards complete closure I think the compression wrap has been a dramatic shift in a good way for him as far as getting the wounds healed a lot of these areas that we will continue to leave Completely sealed up. 11-12-2022 upon evaluation today patient appears to be doing well currently in regard to his leg ulcer. The compression wrapping seems to be doing an excellent job. Fortunately I do not see any evidence of active infection locally nor systemically which is great news and in general I do believe that we are making good headway here towards complete closure. 11-19-2022 upon evaluation today patient appears to be doing well currently in regard to his wounds. He is actually showing signs of excellent improvement and very pleased with where we stand I think that he is making great progress. I do not see any evidence of infection at this time which is great news. 11-26-2022 upon evaluation today patient appears to be doing well currently in regard to his wound. He is actually been tolerating the dressing changes without complication. Fortunately I do not see any evidence of active infection locally nor systemically which is great news I think he is making excellent progress here towards closure. 12-03-2022 upon evaluation today patient appears to be doing well currently in regard to his wound. He in fact at both locations is doing great and seems to be making excellent progress and very pleased in that regard. I do not see any signs of active infection at this time. 12-10-2022 upon evaluation patient's wound is actually significantly smaller with one of the satellite lesions closed and there is just 1 small area remaining and this is actually doing quite well. I am very pleased with her progress. 12-15-2022 upon evaluation today patient appears to be doing actually  pretty well in regard to his wound on the left leg. He does have a little area on the backside that open that was previously close last week due to the fact that his wrap got wet over the weekend and he had to make do with stuff they can find at the drugstore. They made this work out however and the good news is he actually seems to be doing significantly better and the wound looks fine except for the fact that he has a couple of areas that just reopen on the backside very tiny. Nonetheless I do believe that we can go ahead and see what we do about trying to get this moving in the right direction yet again. 12-22-2022 upon evaluation today patient appears to be doing poorly in regard to his wrap slid down and his leg is extremely swollen. It is also painful secondary to it being so swollen. Fortunately I do not see any evidence of infection right now but at the same time I do believe that if we do not get the swelling under control is not  really good to alleviate his discomfort. We definitely can need to change his wrap out however in short order. 12-31-2022 upon evaluation today patient appears to be doing well currently in regard to his wound. He has been tolerating the dressing changes without complication. This actually looks to be doing much better today compared to where we were. Fortunately I do not see any evidence of active infection locally or systemically which is great news. 01-11-2023 upon evaluation today patient appears to be doing well currently in regard to his wound which is actually showing signs of significant improvement. Fortunately I do not see any signs of infection locally or systemically which is great news and in general I do believe that we are making good headway towards complete closure. 01-19-2023 upon evaluation today patient appears to be doing well currently hemoglobin to his wound. He has been tolerating the dressing changes without complication. Fortunately the wound is  not infected unfortunately it is a little bigger due to the fact that the wrap slid down. I do not see any signs of worsening or infection at this time. 01-28-2023 upon evaluation today patient appears to be doing well currently in regard to his wound. He has been tolerating the dressing changes without complication. Fortunately I do not see any signs of infection the compression wrap is doing a really good job and I think it worked pretty much just about healed. [name], [name] ([id number]) [id number] Page 3 of 9 Electronic Signature(s) Signed: 01/28/2023 1:12:59 PM By: Allen Derry PA-C Entered By: Allen Derry on 01/28/2023 13:12:59 -------------------------------------------------------------------------------- Physical Exam Details Patient Name: Date of Service: Matthew Burly Matthew J. 01/28/2023 1:00 PM Medical Record Number: [id number] Patient Account Number: [id number] Date of Birth/Sex: Treating RN: [birth year] (67 y.o. [name] Primary Care Provider: [name] Other Clinician: Referring Provider: Treating Provider/Extender: Matthew Folks in Treatment: 28 Constitutional Well-nourished and well-hydrated in no acute distress. Respiratory normal breathing without difficulty. Psychiatric this patient is able to make decisions and demonstrates good insight into disease process. Alert and Oriented x 3. pleasant and cooperative. Notes Upon inspection patient's wound bed actually showed signs of good granulation and epithelization at this point. Fortunately I do not see any signs of worsening overall and I do believe that the patient is making great headway here. Electronic Signature(s) Signed: 01/28/2023 1:13:41 PM By: Allen Derry PA-C Entered By: Allen Derry on 01/28/2023 13:13:40 -------------------------------------------------------------------------------- Physician Orders Details Patient Name: Date of Service: [name], Matthew  Matthew J. 01/28/2023 1:00 PM Medical Record Number: [id number] Patient Account Number: [id number] Date of Birth/Sex: Treating RN: 11/09/1955 (67 y.o. [name] Primary Care Provider: [name] Other Clinician: Referring Provider: Treating Provider/Extender: Matthew Folks in Treatment: 38 Verbal / Phone Orders: No Diagnosis Coding ICD-10 Coding Code Description E11.622 Type 2 diabetes mellitus with other skin ulcer I89.0 Lymphedema, not elsewhere classified [name], [name] ([id number]) [id number] Page 4 of 9 [phone] Non-pressure chronic ulcer of other part of left lower leg with fat layer exposed L97.812 Non-pressure chronic ulcer of other part of right lower leg with fat layer exposed I10 Essential (primary) hypertension I50.42 Chronic combined systolic (congestive) and diastolic (congestive) heart failure I49.9 Cardiac arrhythmia, unspecified Follow-up Appointments Return Appointment in 1 week. Bathing/ Shower/ Hygiene May shower with wound dressing protected with water repellent cover or cast protector. Anesthetic (Use 'Patient Medications' Section for Anesthetic Order Entry) Lidocaine applied to wound bed Edema Control - Lymphedema / Segmental Compressive Device / Other  Elevate, Exercise Daily and A void Standing for Long Periods of Time. Elevate legs to the level of the heart and pump ankles as often as possible Elevate leg(s) parallel to the floor when sitting. Compression Pump: Use compression pump on left lower extremity for 60 minutes, twice daily. Compression Pump: Use compression pump on right lower extremity for 60 minutes, twice daily. DO YOUR BEST to sleep in the bed at night. DO NOT sleep in your recliner. Long hours of sitting in a recliner leads to swelling of the legs and/or potential wounds on your backside. Wound Treatment Wound #2 - Lower Leg Wound Laterality: Left, Medial Cleanser: Soap and Water 1 x Per  Week/30 Days Discharge Instructions: Gently cleanse wound with antibacterial soap, rinse and pat dry prior to dressing wounds Peri-Wound Care: AandD Ointment 1 x Per Week/30 Days Discharge Instructions: Apply AandD Ointment to leg Peri-Wound Care: Desitin Maximum Strength Ointment 4 (oz) 1 x Per Week/30 Days Discharge Instructions: Apply around wound edges to avoid maceration Prim Dressing: Mepitel One Silicone Wound Contact Layer, 2x3 (in/in) 1 x Per Week/30 Days ary Discharge Instructions: Add to wound bed to prevent sticking. Secondary Dressing: Gauze 1 x Per Week/30 Days Discharge Instructions: As directed: dry, moistened with saline or moistened with Dakins Solution Compression Wrap: Urgo K2 Lite, two layer compression system, large 1 x Per Week/30 Days Electronic Signature(s) Signed: 01/29/2023 1:41:07 PM By: Bonnell Public Signed: 01/29/2023 3:00:55 PM By: Allen Derry PA-C Entered By: Bonnell Public on 01/28/2023 13:11:39 -------------------------------------------------------------------------------- Problem List Details Patient Name: Date of Service: Matthew Burly Matthew J. 01/28/2023 1:00 PM Medical Record Number: [id number] Patient Account Number: [id number] Date of Birth/Sex: Treating RN: 10-Oct-1955 (67 y.o. [name] Primary Care Provider: [name] Other Clinician: Referring Provider: Treating Provider/Extender: Matthew Folks in Treatment: [address] LUCA, BARTIK ([id number]) [id number] Page 5 of 9 ICD-10 Encounter Code Description Active Date MDM Diagnosis E11.622 Type 2 diabetes mellitus with other skin ulcer 07/16/2022 No Yes I89.0 Lymphedema, not elsewhere classified 07/16/2022 No Yes L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2/06/2022 No Yes L97.812 Non-pressure chronic ulcer of other part of right lower leg with fat layer 07/16/2022 No Yes exposed I10  Essential (primary) hypertension 07/16/2022 No Yes I50.42 Chronic combined systolic (congestive) and diastolic (congestive) heart failure 07/16/2022 No Yes I49.9 Cardiac arrhythmia, unspecified 07/16/2022 No Yes Inactive Problems Resolved Problems Electronic Signature(s) Signed: 01/28/2023 12:51:06 PM By: Allen Derry PA-C Entered By: Allen Derry on 01/28/2023 12:51:06 -------------------------------------------------------------------------------- Progress Note Details Patient Name: Date of Service: [name], Matthew Matthew J. 01/28/2023 1:00 PM Medical Record Number: [id number] Patient Account Number: [id number] Date of Birth/Sex: Treating RN: [birth year] (68 y.o. [name] Primary Care Provider: [name] Other Clinician: Referring Provider: Treating Provider/Extender: Matthew Folks in Treatment: 28 Subjective Chief Complaint Information obtained from Patient Bilateral LE ulcers and lymphedema History of Present Illness (HPI) 07-16-2022 upon evaluation today patient appears to be doing poorly currently in regard to his his legs. The left leg is actually worse than the right. This is a patient that is previously been seen in the [place] office and at that point was doing really quite well with compression wrapping and often alginate are either Hydrofera Blue dressings. Fortunately he has gone since November 2020 until just current with out having any issue or need for wound care services which is great news. Unfortunately he is here today because he is  having some issues at this point. DEONDREA, HARNISCH ([id number]) [id number] Page 6 of 9 Patient does have a history of several medical conditions which include diabetes mellitus type 2, lymphedema, hypertension, congestive heart failure, and cardiac arrhythmia though is not sure that this is actually atrial fibrillation based on what he has been told. He does have lymphedema pumps but tells me  he has not used them since the summer when he had to move to a remodel of his building and subsequently never unpacked them. 07-23-2022 upon evaluation today patient appears to be doing well with regard to his legs. He is going require some sharp debridement today but overall seems to be making some progress. I am pleased in that regard he has much less drainage that he had did last week I think the compression wraps are definitely helping. 07-30-2022 upon evaluation today patient appears to be doing poorly in regard to his legs he actually has blue-green drainage which I think is consistent with Pseudomonas. I believe that we need to address this as soon as possible and I discussed that with the patient today as well. Fortunately I do not see any signs of active infection locally nor systemically at this time which is great news. No fevers, chills, nausea, vomiting, or diarrhea. 08-13-2022 upon evaluation today patient appears to be doing well currently in regard to his leg ulcers which are actually looking much better. Fortunately there does not appear to be any signs of active infection at this time which is great news. No fevers, chills, nausea, vomiting, or diarrhea. 3/7; patient with predominantly a left medial lower extremity wound as well as several smaller areas and a small area on the right lateral. His Jodie Echevaria came in today. We are using silver alginate as the primary dressing 09-03-2022 upon evaluation today patient appears to be doing well currently in regard to his wounds. He has been tolerating the dressing changes without complication. Fortunately there does not appear to be any signs of infection there is needed however for sharp debridement. 09-10-2022 upon evaluation today patient appears to be doing well currently in regard to his wound. Has been tolerating the dressing changes without complication. Fortunately there does not appear to be any signs of infection looking systemically  which is great news and overall I am extremely pleased with where we stand today. I do not see any signs of infection. 09-17-2022 upon evaluation today patient actually is making signs of improvement the good news is he is making good improvement. With that being said he still is having quite a time keeping the swelling down. We have been using Tubigrip I think that still probably our best option but nonetheless I think that we are making progress with regard to his wounds. 09-24-2022 upon evaluation today patient appears to be doing well currently in regard to his wounds is continuing to make improvements at this point which is great news and overall I am extremely pleased with where we stand today. Fortunately I do not see any evidence of active infection at this time which is great news and in general I think he is doing quite well with the [facility] and silver alginate. 10-01-2022 upon evaluation today patient appears to be doing pretty well currently in regard to his wounds. He has been tolerating the dressing changes without complication. Fortunately I do not see any evidence of active infection locally nor systemically which is great news and I do believe that 10-08-2022 upon evaluation today patient's wounds actually  are showing some signs of improvement which is great news. Still this is very slow to heal I really feel like he would do better if we can get him in some stronger compression wrapping he does have the juxta lite compression wraps therefore we can actually see about doing that over top of his Tubigrip to see if that can be beneficial. He does not love the hide he had as they are not the most comfortable thing for him but nonetheless I think it would be beneficial he is in agreement with going forward with this. 10-15-2022 upon evaluation today patient appears to be doing well currently in regard to his wounds that do not appear to be infected which is good news. With that being said  unfortunately he still continues to have significant amount of swelling we really need to do something to try to get the swelling under control. I do not see any signs of infection locally nor systemically but at the same time the amount of edema is tremendous. The juxta lite helps but again he is only taking the fluid pills once or twice a week due to the amount that makes him go to the restroom. Therefore he does not take this daily which is really what he needs on a more regular basis. We need to try to get some compression on and is a little bit stronger. 10-22-2022 upon evaluation today patient actually appears to be making some really good progress in regard to his wound. The compression wrap was put on last time actually doing a great job he looks much improved and in general I feel like that we are making great progress. I do not see any evidence of active infection locally nor systemically which is great news. 10-29-2022 upon evaluation today patient appears to be doing decently well in regard to his legs currently. I feel like that his swelling is much better I feel like that he is also doing much better in regard to the wounds in general. I do not see any signs of active infection which is great news and in general I think that he is making excellent progress towards complete resolution. The patient does seem to have 1 issue that is with his wraps actually slipping down working I definitely need to work on that aspect. 11-05-2022 upon evaluation today patient appears to be doing excellent in regard to his wound. He has been tolerating the dressing changes without complication. He actually seems to be making excellent progress I am extremely pleased with where things stand currently. I do believe that we are really making good progress and I think that we are on the right track towards complete closure I think the compression wrap has been a dramatic shift in a good way for him as far as getting  the wounds healed a lot of these areas that we will continue to leave Completely sealed up. 11-12-2022 upon evaluation today patient appears to be doing well currently in regard to his leg ulcer. The compression wrapping seems to be doing an excellent job. Fortunately I do not see any evidence of active infection locally nor systemically which is great news and in general I do believe that we are making good headway here towards complete closure. 11-19-2022 upon evaluation today patient appears to be doing well currently in regard to his wounds. He is actually showing signs of excellent improvement and very pleased with where we stand I think that he is making great progress. I do  not see any evidence of infection at this time which is great news. 11-26-2022 upon evaluation today patient appears to be doing well currently in regard to his wound. He is actually been tolerating the dressing changes without complication. Fortunately I do not see any evidence of active infection locally nor systemically which is great news I think he is making excellent progress here towards closure. 12-03-2022 upon evaluation today patient appears to be doing well currently in regard to his wound. He in fact at both locations is doing great and seems to be making excellent progress and very pleased in that regard. I do not see any signs of active infection at this time. 12-10-2022 upon evaluation patient's wound is actually significantly smaller with one of the satellite lesions closed and there is just 1 small area remaining and this is actually doing quite well. I am very pleased with her progress. 12-15-2022 upon evaluation today patient appears to be doing actually pretty well in regard to his wound on the left leg. He does have a little area on the backside that open that was previously close last week due to the fact that his wrap got wet over the weekend and he had to make do with stuff they can find at the drugstore.  They made this work out however and the good news is he actually seems to be doing significantly better and the wound looks fine except for the fact that he has a couple of areas that just reopen on the backside very tiny. Nonetheless I do believe that we can go ahead and see what we do about trying to get this moving in the right direction yet again. 12-22-2022 upon evaluation today patient appears to be doing poorly in regard to his wrap slid down and his leg is extremely swollen. It is also painful secondary to it being so swollen. Fortunately I do not see any evidence of infection right now but at the same time I do believe that if we do not get the swelling under control is not really good to alleviate his discomfort. We definitely can need to change his wrap out however in short order. 12-31-2022 upon evaluation today patient appears to be doing well currently in regard to his wound. He has been tolerating the dressing changes without complication. This actually looks to be doing much better today compared to where we were. Fortunately I do not see any evidence of active infection locally or systemically which is great news. [name], [name] ([id number]) [id number] Page 7 of 9 01-11-2023 upon evaluation today patient appears to be doing well currently in regard to his wound which is actually showing signs of significant improvement. Fortunately I do not see any signs of infection locally or systemically which is great news and in general I do believe that we are making good headway towards complete closure. 01-19-2023 upon evaluation today patient appears to be doing well currently hemoglobin to his wound. He has been tolerating the dressing changes without complication. Fortunately the wound is not infected unfortunately it is a little bigger due to the fact that the wrap slid down. I do not see any signs of worsening or infection at this time. 01-28-2023 upon evaluation  today patient appears to be doing well currently in regard to his wound. He has been tolerating the dressing changes without complication. Fortunately I do not see any signs of infection the compression wrap is doing a really good job and I think it worked pretty much  just about healed. Objective Constitutional Well-nourished and well-hydrated in no acute distress. Vitals Time Taken: 12:52 PM, Height: 73 in, Weight: 358 lbs, BMI: 47.2, Temperature: 97.8 F, Pulse: 89 bpm, Respiratory Rate: 18 breaths/min, Blood Pressure: 110/72 mmHg. Respiratory normal breathing without difficulty. Psychiatric this patient is able to make decisions and demonstrates good insight into disease process. Alert and Oriented x 3. pleasant and cooperative. General Notes: Upon inspection patient's wound bed actually showed signs of good granulation and epithelization at this point. Fortunately I do not see any signs of worsening overall and I do believe that the patient is making great headway here. Integumentary (Hair, Skin) Wound #2 status is Open. Original cause of wound was Gradually Appeared. The date acquired was: 06/15/2022. The wound has been in treatment 28 weeks. The wound is located on the Left,Medial Lower Leg. The wound measures 1cm length x 0.2cm width x 0.1cm depth; 0.157cm^2 area and 0.016cm^3 volume. The wound is limited to skin breakdown. There is a medium amount of serosanguineous drainage noted. There is no granulation within the wound bed. There is no necrotic tissue within the wound bed. Assessment Active Problems ICD-10 Type 2 diabetes mellitus with other skin ulcer Lymphedema, not elsewhere classified Non-pressure chronic ulcer of other part of left lower leg with fat layer exposed Non-pressure chronic ulcer of other part of right lower leg with fat layer exposed Essential (primary) hypertension Chronic combined systolic (congestive) and diastolic (congestive) heart failure Cardiac arrhythmia,  unspecified Plan Follow-up Appointments: Return Appointment in 1 week. Bathing/ Shower/ Hygiene: May shower with wound dressing protected with water repellent cover or cast protector. Anesthetic (Use 'Patient Medications' Section for Anesthetic Order Entry): Lidocaine applied to wound bed Edema Control - Lymphedema / Segmental Compressive Device / Other: Elevate, Exercise Daily and Avoid Standing for Long Periods of Time. Elevate legs to the level of the heart and pump ankles as often as possible Elevate leg(s) parallel to the floor when sitting. Compression Pump: Use compression pump on left lower extremity for 60 minutes, twice daily. Compression Pump: Use compression pump on right lower extremity for 60 minutes, twice daily. DO YOUR BEST to sleep in the bed at night. DO NOT sleep in your recliner. Long hours of sitting in a recliner leads to swelling of the legs and/or potential wounds on your backside. WOUND #2: - Lower Leg Wound Laterality: Left, Medial Cleanser: Soap and Water 1 x Per Week/30 Days Discharge Instructions: Gently cleanse wound with antibacterial soap, rinse and pat dry prior to dressing wounds Peri-Wound Care: AandD Ointment 1 x Per Week/30 Days Discharge Instructions: Apply AandD Ointment to leg Peri-Wound Care: Desitin Maximum Strength Ointment 4 (oz) 1 x Per Week/30 Days [name], [name] ([id number]) [id number] Page 8 of 9 Discharge Instructions: Apply around wound edges to avoid maceration Prim Dressing: Mepitel One Silicone Wound Contact Layer, 2x3 (in/in) 1 x Per Week/30 Days ary Discharge Instructions: Add to wound bed to prevent sticking. Secondary Dressing: Gauze 1 x Per Week/30 Days Discharge Instructions: As directed: dry, moistened with saline or moistened with Dakins Solution Com pression Wrap: Urgo K2 Lite, two layer compression system, large 1 x Per Week/30 Days 1. Based on what I am seeing I do believe that the patient is  making excellent headway towards closure. I would recommend that we continue currently with the Mepitel as the dressing of choice as opposed to the silver alginate as this was really getting stuck to him. The Mepitel will be using some AandD ointment underneath. The  tubing to continue with Urgo K2 lite compression wrap which seems to be doing very well. I am hopeful he will be healed by next week. We will see patient back for reevaluation in 1 week here in the clinic. If anything worsens or changes patient will contact our office for additional recommendations. Electronic Signature(s) Signed: 01/28/2023 1:18:39 PM By: Allen Derry PA-C Entered By: Allen Derry on 01/28/2023 13:18:39 -------------------------------------------------------------------------------- SuperBill Details Patient Name: Date of Service: [name], Matthew Matthew J. 01/28/2023 Medical Record Number: [id number] Patient Account Number: [id number] Date of Birth/Sex: Treating RN: 06/08/1956 (67 y.o. [name] Primary Care Provider: [name] Other Clinician: Referring Provider: Treating Provider/Extender: Matthew Folks in Treatment: 28 Diagnosis Coding ICD-10 Codes Code Description E11.622 Type 2 diabetes mellitus with other skin ulcer I89.0 Lymphedema, not elsewhere classified L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 L97.812 Non-pressure chronic ulcer of other part of right lower leg with fat layer exposed I10 Essential (primary) hypertension I50.42 Chronic combined systolic (congestive) and diastolic (congestive) heart failure I49.9 Cardiac arrhythmia, unspecified Facility Procedures : CPT4 Code: [id number] Description: 69629 - WOUND CARE VISIT-LEV 3 EST PT Modifier: Quantity: 1 Physician Procedures Electronic Signature(s) Signed: 01/28/2023 1:21:15 PM By: Allen Derry PA-C Entered By: Allen Derry on 01/28/2023 13:21:15

## 2023-02-02 ENCOUNTER — Ambulatory Visit: Payer: PPO | Admitting: Internal Medicine

## 2023-02-03 DIAGNOSIS — I493 Ventricular premature depolarization: Secondary | ICD-10-CM | POA: Diagnosis not present

## 2023-02-03 DIAGNOSIS — I5043 Acute on chronic combined systolic (congestive) and diastolic (congestive) heart failure: Secondary | ICD-10-CM | POA: Diagnosis not present

## 2023-02-04 ENCOUNTER — Encounter: Payer: PPO | Admitting: Physician Assistant

## 2023-02-04 DIAGNOSIS — I890 Lymphedema, not elsewhere classified: Secondary | ICD-10-CM | POA: Diagnosis not present

## 2023-02-04 DIAGNOSIS — L97812 Non-pressure chronic ulcer of other part of right lower leg with fat layer exposed: Secondary | ICD-10-CM | POA: Diagnosis not present

## 2023-02-04 DIAGNOSIS — E11622 Type 2 diabetes mellitus with other skin ulcer: Secondary | ICD-10-CM | POA: Diagnosis not present

## 2023-02-04 DIAGNOSIS — I872 Venous insufficiency (chronic) (peripheral): Secondary | ICD-10-CM | POA: Diagnosis not present

## 2023-02-04 DIAGNOSIS — L97821 Non-pressure chronic ulcer of other part of left lower leg limited to breakdown of skin: Secondary | ICD-10-CM | POA: Diagnosis not present

## 2023-02-04 NOTE — Progress Notes (Addendum)
[name] ([id number]) [id number] Page 1 of 9 Visit Report for 02/04/2023 Chief Complaint Document Details Patient Name: Date of Service: [name] 02/04/2023 12:00 PM Medical Record Number: [id number] Patient Account Number: [id number] Date of Birth/Sex: Treating RN: Sep 21, 1955 (67 y.o. [name] Primary Care Provider: [name] Other Clinician: Referring Provider: Treating Provider/Extender: [name] in Treatment: 29 Information Obtained from: Patient Chief Complaint Bilateral LE ulcers and lymphedema Electronic Signature(s) Signed: 02/04/2023 12:27:42 PM By: Matthew Derry PA-C Entered By: [name] on 02/04/2023 09:27:42 -------------------------------------------------------------------------------- HPI Details Patient Name: Date of Service: [name], [name]. 02/04/2023 12:00 PM Medical Record Number: [id number] Patient Account Number: [id number] Date of Birth/Sex: Treating RN: 11-Nov-1955 (67 y.o. [name] Primary Care Provider: [name] Other Clinician: Referring Provider: Treating Provider/Extender: [name] in Treatment: 29 History of Present Illness HPI Description: 07-16-2022 upon evaluation today patient appears to be doing poorly currently in regard to his his legs. The left leg is actually worse than the right. This is a patient that is previously been seen in the [place] office and at that point was doing really quite well with compression wrapping and often alginate are either Hydrofera Blue dressings. Fortunately he has gone since November 2020 until just current with out having any issue or need for wound care services which is great news. Unfortunately he is here today because he is having some issues at this point. Patient does have a history of several medical conditions which include diabetes mellitus type 2, lymphedema, hypertension, congestive  heart failure, and cardiac arrhythmia though is not sure that this is actually atrial fibrillation based on what he has been told. He does have lymphedema pumps but tells me he has not used them since the summer when he had to move to a remodel of his building and subsequently never unpacked them. 07-23-2022 upon evaluation today patient appears to be doing well with regard to his legs. He is going require some sharp debridement today but overall seems to be making some progress. I am pleased in that regard he has much less drainage that he had did last week I think the compression wraps are definitely helping. 07-30-2022 upon evaluation today patient appears to be doing poorly in regard to his legs he actually has blue-green drainage which I think is consistent with Pseudomonas. I believe that we need to address this as soon as possible and I discussed that with the patient today as well. Fortunately I do not see any signs of active infection locally nor systemically at this time which is great news. No fevers, chills, nausea, vomiting, or diarrhea. [name] ([id number]) [id number] Page 2 of 9 08-13-2022 upon evaluation today patient appears to be doing well currently in regard to his leg ulcers which are actually looking much better. Fortunately there does not appear to be any signs of active infection at this time which is great news. No fevers, chills, nausea, vomiting, or diarrhea. 3/7; patient with predominantly a left medial lower extremity wound as well as several smaller areas and a small area on the right lateral. His Jodie Echevaria came in today. We are using silver alginate as the primary dressing 09-03-2022 upon evaluation today patient appears to be doing well currently in regard to his wounds. He has been tolerating the dressing changes without complication. Fortunately there does not appear to be any signs of infection there is needed however for sharp  debridement. 09-10-2022  upon evaluation today patient appears to be doing well currently in regard to his wound. Has been tolerating the dressing changes without complication. Fortunately there does not appear to be any signs of infection looking systemically which is great news and overall I am extremely pleased with where we stand today. I do not see any signs of infection. 09-17-2022 upon evaluation today patient actually is making signs of improvement the good news is he is making good improvement. With that being said he still is having quite a time keeping the swelling down. We have been using Tubigrip I think that still probably our best option but nonetheless I think that we are making progress with regard to his wounds. 09-24-2022 upon evaluation today patient appears to be doing well currently in regard to his wounds is continuing to make improvements at this point which is great news and overall I am extremely pleased with where we stand today. Fortunately I do not see any evidence of active infection at this time which is great news and in general I think he is doing quite well with the [facility] and silver alginate. 10-01-2022 upon evaluation today patient appears to be doing pretty well currently in regard to his wounds. He has been tolerating the dressing changes without complication. Fortunately I do not see any evidence of active infection locally nor systemically which is great news and I do believe that 10-08-2022 upon evaluation today patient's wounds actually are showing some signs of improvement which is great news. Still this is very slow to heal I really feel like he would do better if we can get him in some stronger compression wrapping he does have the juxta lite compression wraps therefore we can actually see about doing that over top of his Tubigrip to see if that can be beneficial. He does not love the hide he had as they are not the most comfortable thing for him but nonetheless  I think it would be beneficial he is in agreement with going forward with this. 10-15-2022 upon evaluation today patient appears to be doing well currently in regard to his wounds that do not appear to be infected which is good news. With that being said unfortunately he still continues to have significant amount of swelling we really need to do something to try to get the swelling under control. I do not see any signs of infection locally nor systemically but at the same time the amount of edema is tremendous. The juxta lite helps but again he is only taking the fluid pills once or twice a week due to the amount that makes him go to the restroom. Therefore he does not take this daily which is really what he needs on a more regular basis. We need to try to get some compression on and is a little bit stronger. 10-22-2022 upon evaluation today patient actually appears to be making some really good progress in regard to his wound. The compression wrap was put on last time actually doing a great job he looks much improved and in general I feel like that we are making great progress. I do not see any evidence of active infection locally nor systemically which is great news. 10-29-2022 upon evaluation today patient appears to be doing decently well in regard to his legs currently. I feel like that his swelling is much better I feel like that he is also doing much better in regard to the wounds in general. I do not see any signs of  active infection which is great news and in general I think that he is making excellent progress towards complete resolution. The patient does seem to have 1 issue that is with his wraps actually slipping down working I definitely need to work on that aspect. 11-05-2022 upon evaluation today patient appears to be doing excellent in regard to his wound. He has been tolerating the dressing changes without complication. He actually seems to be making excellent progress I am extremely  pleased with where things stand currently. I do believe that we are really making good progress and I think that we are on the right track towards complete closure I think the compression wrap has been a dramatic shift in a good way for him as far as getting the wounds healed a lot of these areas that we will continue to leave Completely sealed up. 11-12-2022 upon evaluation today patient appears to be doing well currently in regard to his leg ulcer. The compression wrapping seems to be doing an excellent job. Fortunately I do not see any evidence of active infection locally nor systemically which is great news and in general I do believe that we are making good headway here towards complete closure. 11-19-2022 upon evaluation today patient appears to be doing well currently in regard to his wounds. He is actually showing signs of excellent improvement and very pleased with where we stand I think that he is making great progress. I do not see any evidence of infection at this time which is great news. 11-26-2022 upon evaluation today patient appears to be doing well currently in regard to his wound. He is actually been tolerating the dressing changes without complication. Fortunately I do not see any evidence of active infection locally nor systemically which is great news I think he is making excellent progress here towards closure. 12-03-2022 upon evaluation today patient appears to be doing well currently in regard to his wound. He in fact at both locations is doing great and seems to be making excellent progress and very pleased in that regard. I do not see any signs of active infection at this time. 12-10-2022 upon evaluation patient's wound is actually significantly smaller with one of the satellite lesions closed and there is just 1 small area remaining and this is actually doing quite well. I am very pleased with her progress. 12-15-2022 upon evaluation today patient appears to be doing actually  pretty well in regard to his wound on the left leg. He does have a little area on the backside that open that was previously close last week due to the fact that his wrap got wet over the weekend and he had to make do with stuff they can find at the drugstore. They made this work out however and the good news is he actually seems to be doing significantly better and the wound looks fine except for the fact that he has a couple of areas that just reopen on the backside very tiny. Nonetheless I do believe that we can go ahead and see what we do about trying to get this moving in the right direction yet again. 12-22-2022 upon evaluation today patient appears to be doing poorly in regard to his wrap slid down and his leg is extremely swollen. It is also painful secondary to it being so swollen. Fortunately I do not see any evidence of infection right now but at the same time I do believe that if we do not get the swelling under control is not  really good to alleviate his discomfort. We definitely can need to change his wrap out however in short order. 12-31-2022 upon evaluation today patient appears to be doing well currently in regard to his wound. He has been tolerating the dressing changes without complication. This actually looks to be doing much better today compared to where we were. Fortunately I do not see any evidence of active infection locally or systemically which is great news. 01-11-2023 upon evaluation today patient appears to be doing well currently in regard to his wound which is actually showing signs of significant improvement. Fortunately I do not see any signs of infection locally or systemically which is great news and in general I do believe that we are making good headway towards complete closure. 01-19-2023 upon evaluation today patient appears to be doing well currently hemoglobin to his wound. He has been tolerating the dressing changes without complication. Fortunately the wound is  not infected unfortunately it is a little bigger due to the fact that the wrap slid down. I do not see any signs of worsening or infection at this time. 01-28-2023 upon evaluation today patient appears to be doing well currently in regard to his wound. He has been tolerating the dressing changes without complication. Fortunately I do not see any signs of infection the compression wrap is doing a really good job and I think it worked pretty much just about healed. [name], [name] ([id number]) [id number] Page 3 of 9 02-04-2023 upon evaluation today patient appears to be doing excellent in regard to his wounds. In fact he is pretty much completely healed on the left although I think this may have just a pinpoint opening. Nonetheless I think it is 1 more week to toughen up. Electronic Signature(s) Signed: 02/04/2023 4:45:50 PM By: Matthew Derry PA-C Entered By: [name] on 02/04/2023 13:45:50 -------------------------------------------------------------------------------- Physical Exam Details Patient Name: Date of Service: Matthew Burly RLES [name]. 02/04/2023 12:00 PM Medical Record Number: [id number] Patient Account Number: [id number] Date of Birth/Sex: Treating RN: 10/03/1955 (67 y.o. [name] Primary Care Provider: [name] Other Clinician: Referring Provider: Treating Provider/Extender: [name] in Treatment: 29 Constitutional Well-nourished and well-hydrated in no acute distress. Respiratory normal breathing without difficulty. Psychiatric this patient is able to make decisions and demonstrates good insight into disease process. Alert and Oriented x 3. pleasant and cooperative. Notes Upon inspection patient's wound bed actually showed signs of good granulation epithelization at this point. Fortunately I do not see any signs of worsening. In fact only on the small blister on the right he seems to be doing quite  well. Electronic Signature(s) Signed: 02/04/2023 4:47:04 PM By: Matthew Derry PA-C Entered By: [name] on 02/04/2023 13:47:03 -------------------------------------------------------------------------------- Physician Orders Details Patient Name: Date of Service: [name], [name]. 02/04/2023 12:00 PM Medical Record Number: [id number] Patient Account Number: [id number] Date of Birth/Sex: Treating RN: 1955-10-01 (67 y.o. [name] Primary Care Provider: [name] Other Clinician: Referring Provider: Treating Provider/Extender: [name] in Treatment: 29 Verbal / Phone Orders: No Diagnosis Coding ICD-10 Coding TYVION, TORREALBA ([id number]) [id number] Page 4 of 9 Code Description E11.622 Type 2 diabetes mellitus with other skin ulcer I89.0 Lymphedema, not elsewhere classified L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 L97.812 Non-pressure chronic ulcer of other part of right lower leg with fat layer exposed I10 Essential (primary) hypertension I50.42 Chronic combined systolic (congestive) and diastolic (congestive) heart failure I49.9  Cardiac arrhythmia, unspecified Follow-up Appointments Return Appointment in 1 week. Bathing/ Shower/ Hygiene May shower with wound dressing protected with water repellent cover or cast protector. Anesthetic (Use 'Patient Medications' Section for Anesthetic Order Entry) Lidocaine applied to wound bed Edema Control - Lymphedema / Segmental Compressive Device / Other Elevate, Exercise Daily and A void Standing for Long Periods of Time. Elevate legs to the level of the heart and pump ankles as often as possible Elevate leg(s) parallel to the floor when sitting. Compression Pump: Use compression pump on left lower extremity for 60 minutes, twice daily. Compression Pump: Use compression pump on right lower extremity for 60 minutes, twice  daily. DO YOUR BEST to sleep in the bed at night. DO NOT sleep in your recliner. Long hours of sitting in a recliner leads to swelling of the legs and/or potential wounds on your backside. Wound Treatment Wound #2 - Lower Leg Wound Laterality: Left, Medial Cleanser: Soap and Water 1 x Per Week/30 Days Discharge Instructions: Gently cleanse wound with antibacterial soap, rinse and pat dry prior to dressing wounds Peri-Wound Care: AandD Ointment 1 x Per Week/30 Days Discharge Instructions: Apply AandD Ointment to leg Peri-Wound Care: Desitin Maximum Strength Ointment 4 (oz) 1 x Per Week/30 Days Discharge Instructions: Apply around wound edges to avoid maceration Prim Dressing: Mepitel One Silicone Wound Contact Layer, 2x3 (in/in) 1 x Per Week/30 Days ary Discharge Instructions: Add to wound bed to prevent sticking. Secondary Dressing: Gauze 1 x Per Week/30 Days Discharge Instructions: As directed: dry, moistened with saline or moistened with Dakins Solution Compression Wrap: Urgo K2 Lite, two layer compression system, large 1 x Per Week/30 Days Wound #3 - Lower Leg Wound Laterality: Right Cleanser: Soap and Water 1 x Per Week/30 Days Discharge Instructions: Gently cleanse wound with antibacterial soap, rinse and pat dry prior to dressing wounds Peri-Wound Care: AandD Ointment 1 x Per Week/30 Days Discharge Instructions: Apply AandD Ointment to leg Peri-Wound Care: Desitin Maximum Strength Ointment 4 (oz) 1 x Per Week/30 Days Discharge Instructions: Apply around wound edges to avoid maceration Prim Dressing: Mepitel One Silicone Wound Contact Layer, 2x3 (in/in) 1 x Per Week/30 Days ary Discharge Instructions: Add to wound bed to prevent sticking. Secondary Dressing: Gauze 1 x Per Week/30 Days Discharge Instructions: As directed: dry, moistened with saline or moistened with Dakins Solution Compression Wrap: Urgo K2 Lite, two layer compression system, large 1 x Per Week/30 Days Electronic  Signature(s) Signed: 02/04/2023 5:00:29 PM By: Angelina Pih Signed: 02/05/2023 1:57:59 PM By: Matthew Derry PA-C Entered By: Angelina Pih on 02/04/2023 10:52:51 [name] ([id number]) [id number] Page 5 of 9 -------------------------------------------------------------------------------- Problem List Details Patient Name: Date of Service: [name] 02/04/2023 12:00 PM Medical Record Number: [id number] Patient Account Number: [id number] Date of Birth/Sex: Treating RN: 09/02/1955 (67 y.o. [name] Primary Care Provider: [name] Other Clinician: Referring Provider: Treating Provider/Extender: [name] in Treatment: 29 Active Problems ICD-10 Encounter Code Description Active Date MDM Diagnosis E11.622 Type 2 diabetes mellitus with other skin ulcer 07/16/2022 No Yes I89.0 Lymphedema, not elsewhere classified 07/16/2022 No Yes L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2/06/2022 No Yes L97.812 Non-pressure chronic ulcer of other part of right lower leg with fat layer 07/16/2022 No Yes exposed I10 Essential (primary) hypertension 07/16/2022 No Yes I50.42 Chronic combined systolic (congestive) and diastolic (congestive) heart failure 07/16/2022 No Yes I49.9 Cardiac arrhythmia, unspecified 07/16/2022 No Yes Inactive Problems Resolved  Problems Electronic Signature(s) Signed: 02/04/2023 12:27:39 PM By: Matthew Derry PA-C Entered By: [name] on 02/04/2023 09:27:39 [name] ([id number]) [id number] Page 6 of 9 -------------------------------------------------------------------------------- Progress Note Details Patient Name: Date of Service: [name] 02/04/2023 12:00 PM Medical Record Number: [id number] Patient Account Number: [id number] Date of Birth/Sex: Treating RN: 1956-06-15 (67 y.o. [name] Primary Care  Provider: [name] Other Clinician: Referring Provider: Treating Provider/Extender: [name] in Treatment: 29 Subjective Chief Complaint Information obtained from Patient Bilateral LE ulcers and lymphedema History of Present Illness (HPI) 07-16-2022 upon evaluation today patient appears to be doing poorly currently in regard to his his legs. The left leg is actually worse than the right. This is a patient that is previously been seen in the [place] office and at that point was doing really quite well with compression wrapping and often alginate are either Hydrofera Blue dressings. Fortunately he has gone since November 2020 until just current with out having any issue or need for wound care services which is great news. Unfortunately he is here today because he is having some issues at this point. Patient does have a history of several medical conditions which include diabetes mellitus type 2, lymphedema, hypertension, congestive heart failure, and cardiac arrhythmia though is not sure that this is actually atrial fibrillation based on what he has been told. He does have lymphedema pumps but tells me he has not used them since the summer when he had to move to a remodel of his building and subsequently never unpacked them. 07-23-2022 upon evaluation today patient appears to be doing well with regard to his legs. He is going require some sharp debridement today but overall seems to be making some progress. I am pleased in that regard he has much less drainage that he had did last week I think the compression wraps are definitely helping. 07-30-2022 upon evaluation today patient appears to be doing poorly in regard to his legs he actually has blue-green drainage which I think is consistent with Pseudomonas. I believe that we need to address this as soon as possible and I discussed that with the patient today as well. Fortunately I do not see any signs of active  infection locally nor systemically at this time which is great news. No fevers, chills, nausea, vomiting, or diarrhea. 08-13-2022 upon evaluation today patient appears to be doing well currently in regard to his leg ulcers which are actually looking much better. Fortunately there does not appear to be any signs of active infection at this time which is great news. No fevers, chills, nausea, vomiting, or diarrhea. 3/7; patient with predominantly a left medial lower extremity wound as well as several smaller areas and a small area on the right lateral. His Jodie Echevaria came in today. We are using silver alginate as the primary dressing 09-03-2022 upon evaluation today patient appears to be doing well currently in regard to his wounds. He has been tolerating the dressing changes without complication. Fortunately there does not appear to be any signs of infection there is needed however for sharp debridement. 09-10-2022 upon evaluation today patient appears to be doing well currently in regard to his wound. Has been tolerating the dressing changes without complication. Fortunately there does not appear to be any signs of infection looking systemically which is great news and overall I am extremely pleased with where we stand today. I do not see any signs of infection. 09-17-2022 upon evaluation today patient actually is  making signs of improvement the good news is he is making good improvement. With that being said he still is having quite a time keeping the swelling down. We have been using Tubigrip I think that still probably our best option but nonetheless I think that we are making progress with regard to his wounds. 09-24-2022 upon evaluation today patient appears to be doing well currently in regard to his wounds is continuing to make improvements at this point which is great news and overall I am extremely pleased with where we stand today. Fortunately I do not see any evidence of active infection at this  time which is great news and in general I think he is doing quite well with the [facility] and silver alginate. 10-01-2022 upon evaluation today patient appears to be doing pretty well currently in regard to his wounds. He has been tolerating the dressing changes without complication. Fortunately I do not see any evidence of active infection locally nor systemically which is great news and I do believe that 10-08-2022 upon evaluation today patient's wounds actually are showing some signs of improvement which is great news. Still this is very slow to heal I really feel like he would do better if we can get him in some stronger compression wrapping he does have the juxta lite compression wraps therefore we can actually see about doing that over top of his Tubigrip to see if that can be beneficial. He does not love the hide he had as they are not the most comfortable thing for him but nonetheless I think it would be beneficial he is in agreement with going forward with this. 10-15-2022 upon evaluation today patient appears to be doing well currently in regard to his wounds that do not appear to be infected which is good news. With that being said unfortunately he still continues to have significant amount of swelling we really need to do something to try to get the swelling under control. I do not see any signs of infection locally nor systemically but at the same time the amount of edema is tremendous. The juxta lite helps but again he is only taking the fluid pills once or twice a week due to the amount that makes him go to the restroom. Therefore he does not take this daily which is really what he needs on a more regular basis. We need to try to get some compression on and is a little bit stronger. 10-22-2022 upon evaluation today patient actually appears to be making some really good progress in regard to his wound. The compression wrap was put on last time actually doing a great job he looks much improved  and in general I feel like that we are making great progress. I do not see any evidence of active infection locally nor systemically which is great news. 10-29-2022 upon evaluation today patient appears to be doing decently well in regard to his legs currently. I feel like that his swelling is much better I feel like that he is also doing much better in regard to the wounds in general. I do not see any signs of active infection which is great news and in general I think that he [name], [name] ([id number]) [id number] Page 7 of 9 is making excellent progress towards complete resolution. The patient does seem to have 1 issue that is with his wraps actually slipping down working I definitely need to work on that aspect. 11-05-2022 upon evaluation today patient appears to be doing excellent in  regard to his wound. He has been tolerating the dressing changes without complication. He actually seems to be making excellent progress I am extremely pleased with where things stand currently. I do believe that we are really making good progress and I think that we are on the right track towards complete closure I think the compression wrap has been a dramatic shift in a good way for him as far as getting the wounds healed a lot of these areas that we will continue to leave Completely sealed up. 11-12-2022 upon evaluation today patient appears to be doing well currently in regard to his leg ulcer. The compression wrapping seems to be doing an excellent job. Fortunately I do not see any evidence of active infection locally nor systemically which is great news and in general I do believe that we are making good headway here towards complete closure. 11-19-2022 upon evaluation today patient appears to be doing well currently in regard to his wounds. He is actually showing signs of excellent improvement and very pleased with where we stand I think that he is making great progress. I do not see  any evidence of infection at this time which is great news. 11-26-2022 upon evaluation today patient appears to be doing well currently in regard to his wound. He is actually been tolerating the dressing changes without complication. Fortunately I do not see any evidence of active infection locally nor systemically which is great news I think he is making excellent progress here towards closure. 12-03-2022 upon evaluation today patient appears to be doing well currently in regard to his wound. He in fact at both locations is doing great and seems to be making excellent progress and very pleased in that regard. I do not see any signs of active infection at this time. 12-10-2022 upon evaluation patient's wound is actually significantly smaller with one of the satellite lesions closed and there is just 1 small area remaining and this is actually doing quite well. I am very pleased with her progress. 12-15-2022 upon evaluation today patient appears to be doing actually pretty well in regard to his wound on the left leg. He does have a little area on the backside that open that was previously close last week due to the fact that his wrap got wet over the weekend and he had to make do with stuff they can find at the drugstore. They made this work out however and the good news is he actually seems to be doing significantly better and the wound looks fine except for the fact that he has a couple of areas that just reopen on the backside very tiny. Nonetheless I do believe that we can go ahead and see what we do about trying to get this moving in the right direction yet again. 12-22-2022 upon evaluation today patient appears to be doing poorly in regard to his wrap slid down and his leg is extremely swollen. It is also painful secondary to it being so swollen. Fortunately I do not see any evidence of infection right now but at the same time I do believe that if we do not get the swelling under control is not really  good to alleviate his discomfort. We definitely can need to change his wrap out however in short order. 12-31-2022 upon evaluation today patient appears to be doing well currently in regard to his wound. He has been tolerating the dressing changes without complication. This actually looks to be doing much better today compared to where  we were. Fortunately I do not see any evidence of active infection locally or systemically which is great news. 01-11-2023 upon evaluation today patient appears to be doing well currently in regard to his wound which is actually showing signs of significant improvement. Fortunately I do not see any signs of infection locally or systemically which is great news and in general I do believe that we are making good headway towards complete closure. 01-19-2023 upon evaluation today patient appears to be doing well currently hemoglobin to his wound. He has been tolerating the dressing changes without complication. Fortunately the wound is not infected unfortunately it is a little bigger due to the fact that the wrap slid down. I do not see any signs of worsening or infection at this time. 01-28-2023 upon evaluation today patient appears to be doing well currently in regard to his wound. He has been tolerating the dressing changes without complication. Fortunately I do not see any signs of infection the compression wrap is doing a really good job and I think it worked pretty much just about healed. 02-04-2023 upon evaluation today patient appears to be doing excellent in regard to his wounds. In fact he is pretty much completely healed on the left although I think this may have just a pinpoint opening. Nonetheless I think it is 1 more week to toughen up. Objective Constitutional Well-nourished and well-hydrated in no acute distress. Vitals Time Taken: 12:15 PM, Height: 73 in, Weight: 358 lbs, BMI: 47.2, Temperature: 97.6 F, Pulse: 79 bpm, Respiratory Rate: 18 breaths/min,  Blood Pressure: 104/70 mmHg. Respiratory normal breathing without difficulty. Psychiatric this patient is able to make decisions and demonstrates good insight into disease process. Alert and Oriented x 3. pleasant and cooperative. General Notes: Upon inspection patient's wound bed actually showed signs of good granulation epithelization at this point. Fortunately I do not see any signs of worsening. In fact only on the small blister on the right he seems to be doing quite well. Integumentary (Hair, Skin) Wound #2 status is Open. Original cause of wound was Gradually Appeared. The date acquired was: 06/15/2022. The wound has been in treatment 29 weeks. The wound is located on the Left,Medial Lower Leg. The wound measures 0.1cm length x 0.1cm width x 0.1cm depth; 0.008cm^2 area and 0.001cm^3 volume. The wound is limited to skin breakdown. There is no tunneling or undermining noted. There is a medium amount of serosanguineous drainage noted. There is no granulation within the wound bed. There is no necrotic tissue within the wound bed. Wound #3 status is Open. Original cause of wound was Gradually Appeared. The date acquired was: 02/04/2023. The wound is located on the Right Lower Leg. The wound measures 1.3cm length x 1.2cm width x 0.2cm depth; 1.225cm^2 area and 0.245cm^3 volume. There is no tunneling or undermining noted. There is a none present amount of drainage noted. There is no granulation within the wound bed. There is a large (67-100%) amount of necrotic tissue within the wound bed including Adherent Slough. 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 There was a Double Layer Compression Therapy Procedure by Angelina Pih, RN. Post procedure Diagnosis Wound #2: Same as Pre-Procedure Wound #3 Pre-procedure diagnosis of Wound #3 is a Lymphedema located on the Right Lower Leg . There was a Double Layer Compression Therapy Procedure by Angelina Pih, RN. Post procedure Diagnosis Wound #3: Same as Pre-Procedure Plan Follow-up Appointments: Return Appointment in 1 week. Bathing/ Shower/ Hygiene: May shower with wound dressing protected with water repellent cover or cast protector. Anesthetic (Use 'Patient Medications' Section for Anesthetic Order Entry): Lidocaine applied to wound bed Edema Control - Lymphedema / Segmental Compressive Device / Other: Elevate, Exercise Daily and Avoid Standing for Long Periods of Time. Elevate legs to the level of the heart and pump ankles as often as possible Elevate leg(s) parallel to the floor when sitting. Compression Pump: Use compression pump on left lower extremity for 60 minutes, twice daily. Compression Pump: Use compression pump on right lower extremity for 60 minutes, twice daily. DO YOUR BEST to sleep in the bed at night. DO NOT sleep in your recliner. Long hours of sitting in a recliner leads to swelling of the legs and/or potential wounds on your backside. WOUND #2: - Lower Leg Wound Laterality: Left, Medial Cleanser: Soap and Water 1 x Per Week/30 Days Discharge Instructions: Gently cleanse wound with antibacterial soap, rinse and pat dry prior to dressing wounds Peri-Wound Care: AandD Ointment 1 x Per Week/30 Days Discharge Instructions: Apply AandD Ointment to leg Peri-Wound Care: Desitin Maximum Strength Ointment 4 (oz) 1 x Per Week/30  Days Discharge Instructions: Apply around wound edges to avoid maceration Prim Dressing: Mepitel One Silicone Wound Contact Layer, 2x3 (in/in) 1 x Per Week/30 Days ary Discharge Instructions: Add to wound bed to prevent sticking. Secondary Dressing: Gauze 1 x Per Week/30 Days Discharge Instructions: As directed: dry, moistened with saline or moistened with Dakins Solution Com pression Wrap: Urgo K2 Lite, two layer compression system, large 1 x Per Week/30 Days WOUND #3: - Lower Leg Wound Laterality: Right Cleanser: Soap and Water 1 x Per Week/30 Days Discharge Instructions: Gently cleanse wound with antibacterial soap, rinse and pat dry prior to dressing wounds Peri-Wound Care: AandD Ointment 1 x Per Week/30 Days Discharge Instructions: Apply AandD Ointment to leg Peri-Wound Care: Desitin Maximum Strength Ointment 4 (oz) 1 x Per Week/30 Days Discharge Instructions: Apply around wound edges to avoid maceration Prim Dressing: Mepitel One Silicone Wound Contact Layer, 2x3 (in/in) 1 x Per Week/30 Days ary Discharge Instructions: Add to wound bed to prevent sticking. Secondary Dressing: Gauze 1 x Per Week/30 Days Discharge Instructions: As directed: dry, moistened with saline or moistened with Dakins Solution Com pression Wrap: Urgo K2 Lite, two layer compression system, large 1 x Per Week/30 Days 1. I would recommend that we have the patient continue to monitor for any signs of infection or worsening. Based on what I am seeing I do believe there were making good headway towards complete closure. 2. I am also can recommend the patient should continue to elevate his legs much as possible I am hopeful he will be healed by next week. We will see patient back for reevaluation in 1 week here in the clinic. If anything worsens or changes patient will contact our office for additional [name], [name] ([id number]) [id number] Page 9 of 9 recommendations. Electronic  Signature(s) Signed: 02/04/2023 4:47:22 PM By: Matthew Derry PA-C Entered By: [name] on 02/04/2023 13:47:22 -------------------------------------------------------------------------------- SuperBill Details Patient Name: Date of Service: Matthew  Tammi [name] 02/04/2023 Medical Record Number: [id number] Patient Account Number: [id number] Date of Birth/Sex: Treating RN: July 02, 1955 (67 y.o. [name] Primary Care Provider: [name] Other Clinician: Referring Provider: Treating Provider/Extender: [name] in Treatment: 29 Diagnosis Coding ICD-10 Codes Code Description E11.622 Type 2 diabetes mellitus with other skin ulcer I89.0 Lymphedema, not elsewhere classified L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 L97.812 Non-pressure chronic ulcer of other part of right lower leg with fat layer exposed I10 Essential (primary) hypertension I50.42 Chronic combined systolic (congestive) and diastolic (congestive) heart failure I49.9 Cardiac arrhythmia, unspecified Facility Procedures : CPT4: Code [id number] Description: 81 BILATERAL: Application of multi-layer venous compression system; leg (below knee), including ankle and t. Modifier: Quantity: 1 Physician Procedures : CPT4 Code Description Modifier [id number] - WC PHYS LEVEL 3 - EST PT ICD-10 Diagnosis Description E11.622 Type 2 diabetes mellitus with other skin ulcer I89.0 Lymphedema, not elsewhere classified L97.822 Non-pressure chronic ulcer of other part of  left lower leg with fat layer exposed L97.812 Non-pressure chronic ulcer of other part of right lower leg with fat layer exposed Quantity: 1 Electronic Signature(s) Signed: 02/04/2023 4:47:41 PM By: Matthew Derry PA-C Previous Signature: 02/04/2023 1:54:08 PM Version By: Angelina Pih Entered By: [name] on 02/04/2023 13:47:40

## 2023-02-04 NOTE — Progress Notes (Signed)
ROMAIN, [name] ([id number]) [id number] Page 1 of 11 Visit Report for 02/04/2023 Arrival Information Details Patient Name: Date of Service: [name] 02/04/2023 12:00 PM Medical Record Number: [id number] Patient Account Number: [id number] Date of Birth/Sex: Treating RN: Jan 27, 1956 (67 y.o. [name] Primary Care Alezandra Egli: [name] Other Clinician: Referring Jewett Mcgann: Treating Leda [place]/Extender: Matthew Folks in Treatment: 29 Visit Information History Since Last Visit Added or deleted any medications: No Patient Arrived: Ambulatory Any new allergies or adverse reactions: No Arrival Time: 12:19 Had a fall or experienced change in No Accompanied By: friend activities of daily living that may affect Transfer Assistance: None risk of falls: Patient Identification Verified: Yes Hospitalized since last visit: No Secondary Verification Process Completed: Yes Has Dressing in Place as Prescribed: Yes Patient Requires Transmission-Based Precautions: No Has Compression in Place as Prescribed: Yes Patient Has Alerts: No Pain Present Now: No Electronic Signature(s) Signed: 02/04/2023 5:00:29 PM By: Angelina Pih Entered By: Angelina Pih on 02/04/2023 09:20:01 -------------------------------------------------------------------------------- Clinic Level of Care Assessment Details Patient Name: Date of Service: [name] 02/04/2023 12:00 PM Medical Record Number: [id number] Patient Account Number: [id number] Date of Birth/Sex: Treating RN: Mar 03, 1956 (67 y.o. [name] Primary Care Althea Backs: [name] Other Clinician: Referring Makayla Confer: Treating Saylee Sherrill/Extender: Matthew Folks in Treatment: 29 Clinic Level of Care Assessment Items TOOL 1 Quantity Score []  - 0 Use when EandM and Procedure is performed on INITIAL visit ASSESSMENTS - Nursing Assessment / Reassessment []  -  0 General Physical Exam (combine w/ comprehensive assessment (listed just below) when performed on new pt. evals) []  - 0 Comprehensive Assessment (HX, ROS, Risk Assessments, Wounds Hx, etc.) ASSESSMENTS - Wound and Skin Assessment / Reassessment []  - 0 Dermatologic / Skin Assessment (not related to wound area) [name], [name] ([id number]) [id number] Page 2 of 11 ASSESSMENTS - Ostomy and/or Continence Assessment and Care []  - 0 Incontinence Assessment and Management []  - 0 Ostomy Care Assessment and Management (repouching, etc.) PROCESS - Coordination of Care []  - 0 Simple Patient / Family Education for ongoing care []  - 0 Complex (extensive) Patient / Family Education for ongoing care []  - 0 Staff obtains Chiropractor, Records, T Results / Process Orders est []  - 0 Staff telephones HHA, Nursing Homes / Clarify orders / etc []  - 0 Routine Transfer to another Facility (non-emergent condition) []  - 0 Routine Hospital Admission (non-emergent condition) []  - 0 New Admissions / Manufacturing engineer / Ordering NPWT Apligraf, etc. , []  - 0 Emergency Hospital Admission (emergent condition) PROCESS - Special Needs []  - 0 Pediatric / Minor Patient Management []  - 0 Isolation Patient Management []  - 0 Hearing / Language / Visual special needs []  - 0 Assessment of Community assistance (transportation, D/C planning, etc.) []  - 0 Additional assistance / Altered mentation []  - 0 Support Surface(s) Assessment (bed, cushion, seat, etc.) INTERVENTIONS - Miscellaneous []  - 0 External ear exam []  - 0 Patient Transfer (multiple staff / Nurse, adult / Similar devices) []  - 0 Simple Staple / Suture removal (25 or less) []  - 0 Complex Staple / Suture removal (26 or more) []  - 0 Hypo/Hyperglycemic Management (do not check if billed separately) []  - 0 Ankle / Brachial Index (ABI) - do not check if billed separately Has the patient been seen at the hospital  within the last three years: Yes Total Score: 0 Level Of Care: ____ Electronic Signature(s) Signed: 02/04/2023 5:00:29 PM By: Angelina Pih Entered By:  Angelina Pih on 02/04/2023 10:53:51 -------------------------------------------------------------------------------- Compression Therapy Details Patient Name: Date of Service: [name] 02/04/2023 12:00 PM Medical Record Number: [id number] Patient Account Number: [id number] Date of Birth/Sex: Treating RN: 1956-03-13 (67 y.o. [name] Primary Care Tytionna Cloyd: [name] Other Clinician: Referring Daizha Anand: Treating Mathias Bogacki/Extender: Matthew Folks in Treatment: 29 Compression Therapy Performed for Wound Assessment: Wound #2 Left,Medial Lower Leg Performed By: Holly Bodily, RN Compression Type: Double Shelby Dubin ([id number]) [id number] Page 3 of 11 Post Procedure Diagnosis Same as Pre-procedure Electronic Signature(s) Signed: 02/04/2023 5:00:29 PM By: Angelina Pih Entered By: Angelina Pih on 02/04/2023 09:31:02 -------------------------------------------------------------------------------- Compression Therapy Details Patient Name: Date of Service: Matthew Burly [name] 02/04/2023 12:00 PM Medical Record Number: [id number] Patient Account Number: [id number] Date of Birth/Sex: Treating RN: 1955/07/20 (67 y.o. [name] Primary Care Mylik Pro: [name] Other Clinician: Referring Iliya Spivack: Treating Jaymin Waln/Extender: Matthew Folks in Treatment: 29 Compression Therapy Performed for Wound Assessment: Wound #3 Right Lower Leg Performed By: Clinician Angelina Pih, RN Compression Type: Double Layer Post Procedure Diagnosis Same as Pre-procedure Electronic Signature(s) Signed: 02/04/2023 5:00:29 PM By: Angelina Pih Entered By: Angelina Pih on 02/04/2023  09:31:02 -------------------------------------------------------------------------------- Encounter Discharge Information Details Patient Name: Date of Service: Matthew Burly [name] 02/04/2023 12:00 PM Medical Record Number: [id number] Patient Account Number: [id number] Date of Birth/Sex: Treating RN: 09-09-1955 (67 y.o. [name] Primary Care Makaylah Oddo: [name] Other Clinician: Referring Mishawn Didion: Treating Mehek Grega/Extender: Matthew Folks in Treatment: 29 Encounter Discharge Information Items Discharge Condition: Stable Ambulatory Status: Cane Discharge Destination: Home Transportation: Private Auto Accompanied By: spouse Schedule Follow-up Appointment: Yes Clinical Summary of Care: [name], [name] ([id number]) [id number] Page 4 of 11 Electronic Signature(s) Signed: 02/04/2023 1:55:15 PM By: Angelina Pih Entered By: Angelina Pih on 02/04/2023 10:55:15 -------------------------------------------------------------------------------- Lower Extremity Assessment Details Patient Name: Date of Service: [name] 02/04/2023 12:00 PM Medical Record Number: [id number] Patient Account Number: [id number] Date of Birth/Sex: Treating RN: 12-05-1955 (67 y.o. [name] Primary Care Mekenzie Modeste: [name] Other Clinician: Referring Shafin Pollio: Treating Porfiria Heinrich/Extender: Matthew Folks in Treatment: 29 Edema Assessment Assessed: [Left: No] [Right: No] Edema: [Left: Yes] [Right: Yes] Calf Left: Right: Point of Measurement: 36 cm From Medial Instep 35 cm 67.5 cm Ankle Left: Right: Point of Measurement: 13 cm From Medial Instep 28.7 cm 31 cm Vascular Assessment Pulses: Dorsalis Pedis Palpable: [Left:Yes] [Right:Yes] Posterior Tibial Palpable: [Left:Yes] [Right:Yes] Extremity colors, hair growth, and conditions: Extremity Color: [Left:Hyperpigmented] [Right:Hyperpigmented] Hair Growth on  Extremity: [Left:No] [Right:No] Temperature of Extremity: [Left:Warm < 3 seconds] [Right:Warm < 3 seconds] Toe Nail Assessment Left: Right: Thick: Yes Yes Discolored: No No Deformed: No No Improper Length and Hygiene: No No Electronic Signature(s) Signed: 02/04/2023 5:00:29 PM By: Angelina Pih Entered By: Angelina Pih on 02/04/2023 09:23:23 [name] ([id number]) [id number] Page 5 of 11 -------------------------------------------------------------------------------- Multi Wound Chart Details Patient Name: Date of Service: [name] 02/04/2023 12:00 PM Medical Record Number: [id number] Patient Account Number: [id number] Date of Birth/Sex: Treating RN: 11/06/1955 (67 y.o. [name] Primary Care Oluwaseun Bruyere: [name] Other Clinician: Referring [place]: Treating Marzell Allemand/Extender: Matthew Folks in Treatment: 29 Vital Signs Height(in): 73 Pulse(bpm): 79 Weight(lbs): 358 Blood Pressure(mmHg): 104/70 Body Mass Index(BMI): 47.2 Temperature(F): 97.6 Respiratory Rate(breaths/min): 18 [2:Photos:] [N/A:N/A] Left, Medial Lower Leg Right Lower Leg N/A Wound Location: Gradually Appeared Gradually Appeared N/A Wounding Event: Venous Leg  Ulcer Lymphedema N/A Primary Etiology: Diabetic Wound/Ulcer of the Lower N/A N/A Secondary Etiology: Extremity Arrhythmia, Congestive Heart Failure, Arrhythmia, Congestive Heart Failure, N/A Comorbid History: Hypertension, Type II Diabetes Hypertension, Type II Diabetes 06/15/2022 02/04/2023 N/A Date Acquired: 29 0 N/A Weeks of Treatment: Open Open N/A Wound Status: No No N/A Wound Recurrence: 0.1x0.1x0.1 1.3x1.2x0.2 N/A Measurements L x W x D (cm) 0.008 1.225 N/A A (cm) : rea 0.001 0.245 N/A Volume (cm) : 100.00% N/A N/A % Reduction in A rea: 100.00% N/A N/A % Reduction in Volume: Partial Thickness Partial Thickness N/A Classification: Medium None Present  N/A Exudate A mount: Serosanguineous N/A N/A Exudate Type: red, brown N/A N/A Exudate Color: None Present (0%) None Present (0%) N/A Granulation A mount: None Present (0%) Large (67-100%) N/A Necrotic A mount: Fascia: No N/A N/A Exposed Structures: Fat Layer (Subcutaneous Tissue): No Tendon: No Muscle: No Joint: No Bone: No Limited to Skin Breakdown Large (67-100%) None N/A Epithelialization: Compression Therapy Compression Therapy N/A Procedures Performed: Treatment Notes Electronic Signature(s) Signed: 02/04/2023 1:51:53 PM By: Angelina Pih Entered By: Angelina Pih on 02/04/2023 10:51:53 [name] ([id number]) [id number] Page 6 of 11 -------------------------------------------------------------------------------- Multi-Disciplinary Care Plan Details Patient Name: Date of Service: [name] 02/04/2023 12:00 PM Medical Record Number: [id number] Patient Account Number: [id number] Date of Birth/Sex: Treating RN: [birth year] (67 y.o. [name] Primary Care Merrillyn Ackerley: [name] Other Clinician: Referring Jacqulyn Barresi: Treating Jeyson Deshotel/Extender: Matthew Folks in Treatment: 29 Active Inactive Venous Leg Ulcer Nursing Diagnoses: Actual venous Insuffiency (use after diagnosis is confirmed) Goals: Patient will maintain optimal edema control Date Initiated: 10/15/2022 Target Resolution Date: 02/09/2023 Goal Status: Active Patient/caregiver will verbalize understanding of disease process and disease management Date Initiated: 10/15/2022 Date Inactivated: 11/12/2022 Target Resolution Date: 10/15/2022 Goal Status: Met Verify adequate tissue perfusion prior to therapeutic compression application Date Initiated: 10/15/2022 Date Inactivated: 11/12/2022 Target Resolution Date: 10/15/2022 Goal Status: Met Interventions: Assess peripheral edema status every visit. Compression as ordered Provide education on venous  insufficiency Treatment Activities: Therapeutic compression applied : 10/15/2022 Notes: Electronic Signature(s) Signed: 02/04/2023 1:54:17 PM By: Angelina Pih Entered By: Angelina Pih on 02/04/2023 10:54:17 -------------------------------------------------------------------------------- Pain Assessment Details Patient Name: Date of Service: Matthew Burly [name] 02/04/2023 12:00 PM Medical Record Number: [id number] Patient Account Number: [id number] Date of Birth/Sex: Treating RN: [birth year] (67 y.o. [name] Primary Care Bernestine Holsapple: [name] Other Clinician: EDY, KRANER ([id number]) [id number] Page 7 of 11 Referring Bosco Paparella: Treating Loistine Eberlin/Extender: Matthew Folks in Treatment: 29 Active Problems Location of Pain Severity and Description of Pain Patient Has Paino No Site Locations Rate the pain. Current Pain Level: 0 Pain Management and Medication Current Pain Management: Electronic Signature(s) Signed: 02/04/2023 5:00:29 PM By: Angelina Pih Entered By: Angelina Pih on 02/04/2023 09:20:36 -------------------------------------------------------------------------------- Patient/Caregiver Education Details Patient Name: Date of Service: [name] 8/22/2024andnbsp12:00 PM Medical Record Number: [id number] Patient Account Number: [id number] Date of Birth/Gender: Treating RN: 1955/08/17 (67 y.o. [name] Primary Care Physician: [name] Other Clinician: Referring Physician: Treating Physician/Extender: Matthew Folks in Treatment: 29 Education Assessment Education Provided To: Patient Education Topics Provided Venous: Handouts: Controlling Swelling with Compression Stockings Methods: Explain/Verbal Responses: State content correctly Wound/Skin Impairment: Handouts: Caring for Your Ulcer Methods: Explain/Verbal Responses: State content correctly [name], [name] ([id number]) [id number] Page 8 of 11 Electronic Signature(s) Signed: 02/04/2023 5:00:29 PM By: Angelina Pih Entered By: Angelina Pih on 02/04/2023 10:54:40 -------------------------------------------------------------------------------- Wound Assessment  Details Patient Name: Date of Service: [name] 02/04/2023 12:00 PM Medical Record Number: [id number] Patient Account Number: [id number] Date of Birth/Sex: Treating RN: 1956-05-06 (67 y.o. [name] Primary Care Sariyah Corcino: [name] Other Clinician: Referring Deseray Daponte: Treating Susan Bleich/Extender: Matthew Folks in Treatment: 29 Wound Status Wound Number: 2 Primary Etiology: Venous Leg Ulcer Wound Location: Left, Medial Lower Leg Secondary Diabetic Wound/Ulcer of the Lower Extremity Etiology: Wounding Event: Gradually Appeared Wound Status: Open Date Acquired: 06/15/2022 Comorbid Arrhythmia, Congestive Heart Failure, Hypertension, Type Weeks Of Treatment: 29 History: II Diabetes Clustered Wound: No Photos Wound Measurements Length: (cm) 0.1 Width: (cm) 0.1 Depth: (cm) 0.1 Area: (cm) 0.008 Volume: (cm) 0.001 % Reduction in Area: 100% % Reduction in Volume: 100% Epithelialization: Large (67-100%) Tunneling: No Undermining: No Wound Description Classification: Partial Thickness Exudate Amount: Medium Exudate Type: Serosanguineous Exudate Color: red, brown Foul Odor After Cleansing: No Slough/Fibrino Yes Wound Bed Granulation Amount: None Present (0%) Exposed Structure Necrotic Amount: None Present (0%) Fascia Exposed: No Fat Layer (Subcutaneous Tissue) Exposed: No Tendon Exposed: No Muscle Exposed: No Joint Exposed: No Bone Exposed: No Limited to Skin [name], [name] ([id number]) [id number] Page 9 of 11 Treatment Notes Wound #2 (Lower Leg) Wound Laterality: Left, Medial Cleanser Soap and  Water Discharge Instruction: Gently cleanse wound with antibacterial soap, rinse and pat dry prior to dressing wounds Peri-Wound Care AandD Ointment Discharge Instruction: Apply AandD Ointment to leg Desitin Maximum Strength Ointment 4 (oz) Discharge Instruction: Apply around wound edges to avoid maceration Topical Primary Dressing Mepitel One Silicone Wound Contact Layer, 2x3 (in/in) Discharge Instruction: Add to wound bed to prevent sticking. Secondary Dressing Gauze Discharge Instruction: As directed: dry, moistened with saline or moistened with Dakins Solution Secured With Compression Wrap Urgo K2 Lite, two layer compression system, large Compression Stockings Add-Ons Electronic Signature(s) Signed: 02/04/2023 5:00:29 PM By: Angelina Pih Entered By: Angelina Pih on 02/04/2023 09:21:16 -------------------------------------------------------------------------------- Wound Assessment Details Patient Name: Date of Service: Matthew Burly [name] 02/04/2023 12:00 PM Medical Record Number: [id number] Patient Account Number: [id number] Date of Birth/Sex: Treating RN: 04/07/1956 (67 y.o. [name] Primary Care Manvir Prabhu: [name] Other Clinician: Referring Asmara Backs: Treating Georgena Weisheit/Extender: Matthew Folks in Treatment: 29 Wound Status Wound Number: 3 Primary Lymphedema Etiology: Wound Location: Right Lower Leg Wound Status: Open Wounding Event: Gradually Appeared Comorbid Arrhythmia, Congestive Heart Failure, Hypertension, Type II Date Acquired: 02/04/2023 History: Diabetes Weeks Of Treatment: 0 Clustered Wound: No Photos [name], [name] ([id number]) [id number] Page 10 of 11 Wound Measurements Length: (cm) 1.3 Width: (cm) 1.2 Depth: (cm) 0.2 Area: (cm) 1.225 Volume: (cm) 0.245 % Reduction in Area: % Reduction in Volume: Epithelialization: None Tunneling: No Undermining: No Wound  Description Classification: Partial Thickness Exudate Amount: None Present Foul Odor After Cleansing: No Slough/Fibrino Yes Wound Bed Granulation Amount: None Present (0%) Necrotic Amount: Large (67-100%) Necrotic Quality: Adherent Slough Treatment Notes Wound #3 (Lower Leg) Wound Laterality: Right Cleanser Soap and Water Discharge Instruction: Gently cleanse wound with antibacterial soap, rinse and pat dry prior to dressing wounds Peri-Wound Care AandD Ointment Discharge Instruction: Apply AandD Ointment to leg Desitin Maximum Strength Ointment 4 (oz) Discharge Instruction: Apply around wound edges to avoid maceration Topical Primary Dressing Mepitel One Silicone Wound Contact Layer, 2x3 (in/in) Discharge Instruction: Add to wound bed to prevent sticking. Secondary Dressing Gauze Discharge Instruction: As directed: dry, moistened with saline or moistened with Dakins Solution Secured With Compression Wrap Urgo K2 Lite, two layer  compression system, large Compression Stockings Add-Ons Electronic Signature(s) Signed: 02/04/2023 5:00:29 PM By: Angelina Pih Entered By: Angelina Pih on 02/04/2023 09:22:21 [name], [name] ([id number]) [id number] Page 11 of 11 -------------------------------------------------------------------------------- Vitals Details Patient Name: Date of Service: [name] 02/04/2023 12:00 PM Medical Record Number: [id number] Patient Account Number: [id number] Date of Birth/Sex: Treating RN: 01-Feb-1956 (67 y.o. [name] Primary Care Kambri Dismore: [name] Other Clinician: Referring Cynai Skeens: Treating Santresa Levett/Extender: Matthew Folks in Treatment: 29 Vital Signs Time Taken: 12:15 Temperature (F): 97.6 Height (in): 73 Pulse (bpm): 79 Weight (lbs): 358 Respiratory Rate (breaths/min): 18 Body Mass Index (BMI): 47.2 Blood Pressure (mmHg): 104/70 Reference Range: 80 - 120 mg /  dl Electronic Signature(s) Signed: 02/04/2023 5:00:29 PM By: Angelina Pih Entered By: Angelina Pih on 02/04/2023 09:20:28

## 2023-02-09 DIAGNOSIS — E11622 Type 2 diabetes mellitus with other skin ulcer: Secondary | ICD-10-CM | POA: Diagnosis not present

## 2023-02-10 NOTE — Progress Notes (Signed)
KLINE, BOWDER ([id number]) [id number] Page 1 of 2 Visit Report for 02/09/2023 Physician Orders Details Patient Name: Date of Service: [name] 02/09/2023 11:30 A M Medical Record Number: [id number] Patient Account Number: [id number] Date of Birth/Sex: Treating RN: 07/15/1955 (67 y.o. [name]) Yevonne Pax Primary Care Provider: [name] Other Clinician: [name] Referring Provider: Treating Provider/Extender: RO BSO Dorris Carnes, MICHA EL Geanie Logan in Treatment: 29 Verbal / Phone Orders: Yes Clinician: Yevonne Pax Read Back and Verified: Yes Diagnosis Coding Follow-up Appointments Return Appointment in 1 week. Bathing/ Shower/ Hygiene May shower with wound dressing protected with water repellent cover or cast protector. Anesthetic (Use 'Patient Medications' Section for Anesthetic Order Entry) Lidocaine applied to wound bed Edema Control - Lymphedema / Segmental Compressive Device / Other Elevate, Exercise Daily and A void Standing for Long Periods of Time. Elevate legs to the level of the heart and pump ankles as often as possible Elevate leg(s) parallel to the floor when sitting. Compression Pump: Use compression pump on left lower extremity for 60 minutes, twice daily. Compression Pump: Use compression pump on right lower extremity for 60 minutes, twice daily. DO YOUR BEST to sleep in the bed at night. DO NOT sleep in your recliner. Long hours of sitting in a recliner leads to swelling of the legs and/or potential wounds on your backside. Wound Treatment Wound #2 - Lower Leg Wound Laterality: Left, Medial Cleanser: Soap and Water 1 x Per Week/30 Days Discharge Instructions: Gently cleanse wound with antibacterial soap, rinse and pat dry prior to dressing wounds Peri-Wound Care: AandD Ointment 1 x Per Week/30 Days Discharge Instructions: Apply AandD Ointment to leg Peri-Wound Care: Desitin Maximum Strength Ointment 4 (oz) 1 x Per  Week/30 Days Discharge Instructions: Apply around wound edges to avoid maceration Prim Dressing: Mepitel One Silicone Wound Contact Layer, 2x3 (in/in) 1 x Per Week/30 Days ary Discharge Instructions: Add to wound bed to prevent sticking. Secondary Dressing: Gauze 1 x Per Week/30 Days Discharge Instructions: As directed: dry, moistened with saline or moistened with Dakins Solution Compression Wrap: Urgo K2 Lite, two layer compression system, large 1 x Per Week/30 Days Wound #3 - Lower Leg Wound Laterality: Right Cleanser: Soap and Water 1 x Per Week/30 Days Discharge Instructions: Gently cleanse wound with antibacterial soap, rinse and pat dry prior to dressing wounds Peri-Wound Care: AandD Ointment 1 x Per Week/30 Days Discharge Instructions: Apply AandD Ointment to leg Peri-Wound Care: Desitin Maximum Strength Ointment 4 (oz) 1 x Per Week/30 Days Discharge Instructions: Apply around wound edges to avoid maceration Prim Dressing: Mepitel One Silicone Wound Contact Layer, 2x3 (in/in) 1 x Per Week/30 Days ary Discharge Instructions: Add to wound bed to prevent sticking. ELSTER, PETERSON ([id number]) [id number] Page 2 of 2 Secondary Dressing: Gauze 1 x Per Week/30 Days Discharge Instructions: As directed: dry, moistened with saline or moistened with Dakins Solution Compression Wrap: Urgo K2 Lite, two layer compression system, large 1 x Per Week/30 Days Electronic Signature(s) Signed: 02/09/2023 5:06:52 PM By: Baltazar Najjar MD Signed: 02/10/2023 4:41:54 PM By: [name] Entered By: [name] on 02/09/2023 14:10:56 -------------------------------------------------------------------------------- SuperBill Details Patient Name: Date of Service: [name] 02/09/2023 Medical Record Number: [id number] Patient Account Number: [id number] Date of Birth/Sex: Treating RN: 10-17-1955 (67 y.o. [name] Primary Care Provider: [name] Other  Clinician: [name] Referring Provider: Treating Provider/Extender: RO BSO N, MICHA EL Geanie Logan in Treatment: 29 Diagnosis Coding ICD-10 Codes Code Description  E11.622 Type 2 diabetes mellitus with other skin ulcer I89.0 Lymphedema, not elsewhere classified L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 L97.812 Non-pressure chronic ulcer of other part of right lower leg with fat layer exposed I10 Essential (primary) hypertension I50.42 Chronic combined systolic (congestive) and diastolic (congestive) heart failure I49.9 Cardiac arrhythmia, unspecified Facility Procedures : CPT4: Code [id number] Description: 81 BILATERAL: Application of multi-layer venous compression system; leg (below knee), including ankle and t. Modifier: Quantity: 1 Electronic Signature(s) Signed: 02/09/2023 5:06:52 PM By: Baltazar Najjar MD Signed: 02/10/2023 4:41:54 PM By: [name] Entered By: [name] on 02/09/2023 14:11:57

## 2023-02-12 NOTE — Progress Notes (Signed)
Matthew, [name] ([id number]) [id number] Page 1 of 6 Visit Report for 02/09/2023 Arrival Information Details Patient Name: Date of Service: [name] 02/09/2023 11:30 A M Medical Record Number: [id number] Patient Account Number: [id number] Date of Birth/Sex: Treating RN: Nov 08, 1955 (66 y.o. [name]) [name] Primary Care [name]: [name] Other Clinician: [name] Referring [name]: Treating [name]/Extender: [name] in Treatment: 29 Visit Information History Since Last Visit All ordered tests and consults were completed: No Patient Arrived: [name] Added or deleted any medications: No Arrival Time: 12:22 Any new allergies or adverse reactions: No Transfer Assistance: None Had a fall or experienced change in No Patient Identification Verified: Yes activities of daily living that may affect Secondary Verification Process Completed: Yes risk of falls: Patient Requires Transmission-Based Precautions: No Signs or symptoms of abuse/neglect since last visito No Patient Has Alerts: No Hospitalized since last visit: No Implantable device outside of the clinic excluding No cellular tissue based products placed in the center since last visit: Pain Present Now: Yes Electronic Signature(s) Signed: 02/10/2023 4:41:54 PM By: [name] Entered By: [name] on 02/09/2023 09:23:12 -------------------------------------------------------------------------------- Clinic Level of Care Assessment Details Patient Name: Date of Service: [name] 02/09/2023 11:30 A M Medical Record Number: [id number] Patient Account Number: [id number] Date of Birth/Sex: Treating RN: June 20, 1955 (66 y.o. [name]) [name] Primary Care [name]: [name] Other Clinician: [name] Referring Lorenso Quirino: Treating [name]/Extender: [name] in Treatment: 29 Clinic Level of Care  Assessment Items TOOL 1 Quantity Score []  - 0 Use when EandM and Procedure is performed on INITIAL visit ASSESSMENTS - Nursing Assessment / Reassessment []  - 0 General Physical Exam (combine w/ comprehensive assessment (listed just below) when performed on new pt. evals) []  - 0 Comprehensive Assessment (HX, ROS, Risk Assessments, Wounds Hx, etc.) ROGEN, MESSMER ([id number]) [phone].pdf Page 2 of 6 ASSESSMENTS - Wound and Skin Assessment / Reassessment []  - 0 Dermatologic / Skin Assessment (not related to wound area) ASSESSMENTS - Ostomy and/or Continence Assessment and Care []  - 0 Incontinence Assessment and Management []  - 0 Ostomy Care Assessment and Management (repouching, etc.) PROCESS - Coordination of Care []  - 0 Simple Patient / Family Education for ongoing care []  - 0 Complex (extensive) Patient / Family Education for ongoing care []  - 0 Staff obtains Chiropractor, Records, T Results / Process Orders est []  - 0 Staff telephones HHA, Nursing Homes / Clarify orders / etc []  - 0 Routine Transfer to another Facility (non-emergent condition) []  - 0 Routine Hospital Admission (non-emergent condition) []  - 0 New Admissions / Manufacturing engineer / Ordering NPWT Apligraf, etc. , []  - 0 Emergency Hospital Admission (emergent condition) PROCESS - Special Needs []  - 0 Pediatric / Minor Patient Management []  - 0 Isolation Patient Management []  - 0 Hearing / Language / Visual special needs []  - 0 Assessment of Community assistance (transportation, D/C planning, etc.) []  - 0 Additional assistance / Altered mentation []  - 0 Support Surface(s) Assessment (bed, cushion, seat, etc.) INTERVENTIONS - Miscellaneous []  - 0 External ear exam []  - 0 Patient Transfer (multiple staff / Nurse, adult / Similar devices) []  - 0 Simple Staple / Suture removal (25 or less) []  - 0 Complex Staple / Suture removal (26 or more) []  - 0 Hypo/Hyperglycemic  Management (do not check if billed separately) []  - 0 Ankle / Brachial Index (ABI) - do not check if billed  separately Has the patient been seen at the hospital within the last three years: Yes Total Score: 0 Level Of Care: ____ Electronic Signature(s) Signed: 02/10/2023 4:41:54 PM By: [name] Entered By: [name] on 02/09/2023 11:11:37 -------------------------------------------------------------------------------- Compression Therapy Details Patient Name: Date of Service: Matthew Macadam J. 02/09/2023 11:30 A M Medical Record Number: [id number] Patient Account Number: [id number] Date of Birth/Sex: Treating RN: [birth year] (66 y.o. [name]) [name] Primary Care Brenda Cowher: [name] Other Clinician: [name] Referring Giavonni Cizek: Treating Tonna Palazzi/Extender: [name], [name] in Treatment: [address] [place] ([id number]) [id number] Page 3 of 6 Compression Therapy Performed for Wound Assessment: Wound #2 Left,Medial Lower Leg Performed By: [name], [name], Compression Type: Double Layer Pre Treatment ABI: 0.9 Electronic Signature(s) Signed: 02/10/2023 4:41:54 PM By: [name] Entered By: [name] on 02/09/2023 09:24:22 -------------------------------------------------------------------------------- Compression Therapy Details Patient Name: Date of Service: Matthew Macadam J. 02/09/2023 11:30 A M Medical Record Number: [id number] Patient Account Number: [id number] Date of Birth/Sex: Treating RN: 01/12/1956 (66 y.o. [name]) [name] Primary Care Murriel Eidem: [name] Other Clinician: [name] Referring Chukwudi Ewen: Treating [name]/Extender: [name] in Treatment: 29 Compression Therapy Performed for Wound Assessment: Wound #3 Right Lower Leg Performed By: [name], [name], Compression Type: Double Layer Pre Treatment ABI: 0.8 Electronic  Signature(s) Signed: 02/10/2023 4:41:54 PM By: [name] Entered By: [name] on 02/09/2023 09:24:51 -------------------------------------------------------------------------------- Encounter Discharge Information Details Patient Name: Date of Service: [name] J. 02/09/2023 11:30 A M Medical Record Number: [id number] Patient Account Number: [id number] Date of Birth/Sex: Treating RN: [birth year] (66 y.o. [name]) [name] Primary Care Everli Rother: [name] Other Clinician: [name] Referring Shanautica Forker: Treating Travez Stancil/Extender: [name] in Treatment: 29 Encounter Discharge Information Items Discharge Condition: Stable Ambulatory Status: Cane Discharge Destination: Home Transportation: Private Auto Accompanied By: self Schedule Follow-up Appointment: Yes Clinical Summary of Care: Electronic Signature(s) ARINZE, WOLTER ([id number]) [id number] Page 4 of 6 Signed: 02/10/2023 4:41:54 PM By: [name] Entered By: [name] on 02/09/2023 11:11:26 -------------------------------------------------------------------------------- Wound Assessment Details Patient Name: Date of Service: [name] 02/09/2023 11:30 A M Medical Record Number: [id number] Patient Account Number: [id number] Date of Birth/Sex: Treating RN: 08-21-1955 (66 y.o. 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 Event: Gradually Appeared Etiology: Date Acquired: 06/15/2022 Wound Status: Open Weeks Of Treatment: 29 Comorbid Arrhythmia, Congestive Heart Failure, Hypertension, Type Clustered Wound:  No History: II Diabetes Wound Measurements Length: (cm) 0.1 Width: (cm) 0.1 Depth: (cm) 0.1 Area: (cm) 0.008 Volume: (cm) 0.001 % Reduction in Area: 100% % Reduction in Volume: 100% Epithelialization: Large (67-100%) Wound Description Classification: Partial Thickness Exudate Amount: Medium Exudate Type: Serosanguineous Exudate Color: red, brown Foul Odor After Cleansing: No Slough/Fibrino Yes Wound Bed Granulation Amount: None Present (0%) Exposed Structure Necrotic Amount: None Present (0%) Fascia Exposed: No Fat Layer (Subcutaneous Tissue) Exposed: No Tendon Exposed: No Muscle Exposed: No Joint Exposed: No Bone Exposed: No Limited to Skin Breakdown Treatment Notes Wound #2 (Lower Leg) Wound Laterality: Left, Medial Cleanser Soap and Water Discharge Instruction: Gently cleanse wound with antibacterial soap, rinse and pat dry prior to  dressing wounds Peri-Wound Care AandD Ointment Discharge Instruction: Apply AandD Ointment to leg Desitin Maximum Strength Ointment 4 (oz) Discharge Instruction: Apply around wound edges to avoid maceration Topical LENYX, SHAMBERGER ([id number]) [phone] Page 5 of 6 Primary Dressing Mepitel One Silicone Wound Contact Layer, 2x3 (in/in) Discharge Instruction: Add to wound bed to prevent sticking. Secondary Dressing Gauze Discharge Instruction: As directed: dry, moistened with saline or moistened with Dakins Solution Secured With Compression Wrap Urgo K2 Lite, two layer compression system, large Compression Stockings Add-Ons Electronic Signature(s) Signed: 02/10/2023 4:41:54 PM By: [name] Signed: 02/12/2023 11:58:03 AM By: Matthew Pax RN Entered By: [name] on 02/09/2023 09:23:27 -------------------------------------------------------------------------------- Wound Assessment Details Patient Name: Date of Service: [name] J. 02/09/2023 11:30 A M Medical Record Number:  [id number] Patient Account Number: [id number] Date of Birth/Sex: Treating RN: 1955-10-04 (66 y.o. [name]) [name] Primary Care Geordan Xu: [name] Other Clinician: [name] Referring Bridget Metaline: Treating Joleen Stuckert/Extender: [name] in Treatment: 29 Wound Status Wound Number: 3 Primary Lymphedema Etiology: Wound Location: Right Lower Leg Wound Status: Open Wounding Event: Gradually Appeared Comorbid Arrhythmia, Congestive Heart Failure, Hypertension, Type II Date Acquired: 02/04/2023 History: Diabetes Weeks Of Treatment: 0 Clustered Wound: No Wound Measurements Length: (cm) 1.3 Width: (cm) 1.2 Depth: (cm) 0.2 Area: (cm) 1.225 Volume: (cm) 0.245 % Reduction in Area: 0% % Reduction in Volume: 0% Epithelialization: None Wound Description Classification: Partial Thickness Exudate Amount: None Present Foul Odor After Cleansing: No Slough/Fibrino Yes Wound Bed Granulation Amount: None Present (0%) Necrotic Amount: Large (67-100%) Necrotic Quality: Adherent Slough Treatment Notes Wound #3 (Lower Leg) Wound Laterality: Right Cleanser CREE, STRAWDER ([id number]) [id number] Page 6 of 6 Soap and Water Discharge Instruction: Gently cleanse wound with antibacterial soap, rinse and pat dry prior to dressing wounds Peri-Wound Care AandD Ointment Discharge Instruction: Apply AandD Ointment to leg Desitin Maximum Strength Ointment 4 (oz) Discharge Instruction: Apply around wound edges to avoid maceration Topical Primary Dressing Mepitel One Silicone Wound Contact Layer, 2x3 (in/in) Discharge Instruction: Add to wound bed to prevent sticking. Secondary Dressing Gauze Discharge Instruction: As directed: dry, moistened with saline or moistened with Dakins Solution Secured With Compression Wrap Urgo K2 Lite, two layer compression system, large Compression Stockings Add-Ons Electronic Signature(s) Signed:  02/10/2023 4:41:54 PM By: [name] Signed: 02/12/2023 11:58:03 AM By: Matthew Pax RN Entered By: [name] on 02/09/2023 09:23:50

## 2023-02-16 ENCOUNTER — Encounter: Payer: PPO | Attending: Physician Assistant | Admitting: Physician Assistant

## 2023-02-16 DIAGNOSIS — I4891 Unspecified atrial fibrillation: Secondary | ICD-10-CM | POA: Diagnosis not present

## 2023-02-16 DIAGNOSIS — L97822 Non-pressure chronic ulcer of other part of left lower leg with fat layer exposed: Secondary | ICD-10-CM | POA: Insufficient documentation

## 2023-02-16 DIAGNOSIS — E1151 Type 2 diabetes mellitus with diabetic peripheral angiopathy without gangrene: Secondary | ICD-10-CM | POA: Diagnosis not present

## 2023-02-16 DIAGNOSIS — I110 Hypertensive heart disease with heart failure: Secondary | ICD-10-CM | POA: Insufficient documentation

## 2023-02-16 DIAGNOSIS — L97812 Non-pressure chronic ulcer of other part of right lower leg with fat layer exposed: Secondary | ICD-10-CM | POA: Insufficient documentation

## 2023-02-16 DIAGNOSIS — I890 Lymphedema, not elsewhere classified: Secondary | ICD-10-CM | POA: Insufficient documentation

## 2023-02-16 DIAGNOSIS — I5042 Chronic combined systolic (congestive) and diastolic (congestive) heart failure: Secondary | ICD-10-CM | POA: Insufficient documentation

## 2023-02-16 DIAGNOSIS — E11622 Type 2 diabetes mellitus with other skin ulcer: Secondary | ICD-10-CM | POA: Diagnosis not present

## 2023-02-16 NOTE — Progress Notes (Addendum)
OZA, RAMNATH ([id number]) [id number] Page 1 of 10 Visit Report for 02/16/2023 Chief Complaint Document Details Patient Name: Date of Service: [name] 02/16/2023 11:00 A M Medical Record Number: [id number] Patient Account Number: [id number] Date of Birth/Sex: Treating RN: [birth year], [name] (66 y.o. [name]) Yevonne Pax Primary Care Provider: [name] Other Clinician: Referring Provider: Treating Provider/Extender: Matthew Folks in Treatment: 30 Information Obtained from: Patient Chief Complaint Bilateral LE ulcers and lymphedema Electronic Signature(s) Signed: 02/16/2023 11:22:17 AM By: Allen Derry PA-C Entered By: Allen Derry on [name]/08/2022 08:22:17 -------------------------------------------------------------------------------- Debridement Details Patient Name: Date of Service: [name], Matthew [name] 02/16/2023 11:00 A M Medical Record Number: [id number] Patient Account Number: [id number] Date of Birth/Sex: Treating RN: [name]-[birth year] (66 y.o. [name]) Yevonne Pax Primary Care Provider: [name] Other Clinician: Referring Provider: Treating Provider/Extender: Matthew Folks in Treatment: 30 Debridement Performed for Assessment: Wound #3 Right Lower Leg Performed By: Physician Allen Derry, PA-C Debridement Type: Debridement Level of Consciousness (Pre-procedure): Awake and Alert Pre-procedure Verification/Time Out Yes - 11:35 Taken: Start Time: 11:35 Percent of Wound Bed Debrided: 100% T Area Debrided (cm): otal 0.78 Tissue and other material debrided: Eschar, Subcutaneous Level: Skin/Subcutaneous Tissue Debridement Description: Excisional Instrument: Curette Bleeding: Minimum Hemostasis Achieved: Pressure End Time: 11:39 Procedural Pain: 0 Post Procedural Pain: 0 Response to Treatment: Procedure was tolerated well GEORG, FONS ([id number]) [id number] Page 2 of 10 Level of  Consciousness (Post- Awake and Alert procedure): Post Debridement Measurements of Total Wound Length: (cm) 1 Width: (cm) 1 Depth: (cm) 0.1 Volume: (cm) 0.079 Character of Wound/Ulcer Post Debridement: Improved Post Procedure Diagnosis Same as Pre-procedure Electronic Signature(s) Signed: 02/16/2023 2:10:08 PM By: Yevonne Pax RN Signed: 02/19/2023 2:02:35 PM By: Allen Derry PA-C Entered By: Yevonne Pax on [name]/08/2022 11:10:08 -------------------------------------------------------------------------------- HPI Details Patient Name: Date of Service: Matthew Burly [name] 02/16/2023 11:00 A M Medical Record Number: [id number] Patient Account Number: [id number] Date of Birth/Sex: Treating RN: [birth year] (66 y.o. [name] Primary Care Provider: [name] Other Clinician: Referring Provider: Treating Provider/Extender: Matthew Folks in Treatment: 30 History of Present Illness HPI Description: 07-16-2022 upon evaluation today patient appears to be doing poorly currently in regard to his his legs. The left leg is actually worse than the right. This is a patient that is previously been seen in the [place] office and at that point was doing really quite well with compression wrapping and often alginate are either Hydrofera Blue dressings. Fortunately he has gone since November 2020 until just current with out having any issue or need for wound care services which is great news. Unfortunately he is here today because he is having some issues at this point. Patient does have a history of several medical conditions which include diabetes mellitus type 2, lymphedema, hypertension, congestive heart failure, and cardiac arrhythmia though is not sure that this is actually atrial fibrillation based on what he has been told. He does have lymphedema pumps but tells me he has not used them since the summer when he had to move to a remodel of his building and subsequently never  unpacked them. 07-23-2022 upon evaluation today patient appears to be doing well with regard to his legs. He is going require some sharp debridement today but overall seems to be making some progress. I am pleased in that regard he has much less drainage that he had did last week I think the compression wraps are definitely helping. 07-30-2022 upon evaluation  today patient appears to be doing poorly in regard to his legs he actually has blue-green drainage which I think is consistent with Pseudomonas. I believe that we need to address this as soon as possible and I discussed that with the patient today as well. Fortunately I do not see any signs of active infection locally nor systemically at this time which is great news. No fevers, chills, nausea, vomiting, or diarrhea. 08-13-2022 upon evaluation today patient appears to be doing well currently in regard to his leg ulcers which are actually looking much better. Fortunately there does not appear to be any signs of active infection at this time which is great news. No fevers, chills, nausea, vomiting, or diarrhea. 3/7; patient with predominantly a left medial lower extremity wound as well as several smaller areas and a small area on the right lateral. His Jodie Echevaria came in today. We are using silver alginate as the primary dressing 09-03-2022 upon evaluation today patient appears to be doing well currently in regard to his wounds. He has been tolerating the dressing changes without complication. Fortunately there does not appear to be any signs of infection there is needed however for sharp debridement. 09-10-2022 upon evaluation today patient appears to be doing well currently in regard to his wound. Has been tolerating the dressing changes without complication. Fortunately there does not appear to be any signs of infection looking systemically which is great news and overall I am extremely pleased with where we stand today. I do not see any signs of  infection. 09-17-2022 upon evaluation today patient actually is making signs of improvement the good news is he is making good improvement. With that being said he still is having quite a time keeping the swelling down. We have been using Tubigrip I think that still probably our best option but nonetheless I think that we are making progress with regard to his wounds. 09-24-2022 upon evaluation today patient appears to be doing well currently in regard to his wounds is continuing to make improvements at this point which is great news and overall I am extremely pleased with where we stand today. Fortunately I do not see any evidence of active infection at this time which is great news and in general I think he is doing quite well with the [facility] and silver alginate. [name], [name] ([id number]) [id number] Page 3 of 10 10-01-2022 upon evaluation today patient appears to be doing pretty well currently in regard to his wounds. He has been tolerating the dressing changes without complication. Fortunately I do not see any evidence of active infection locally nor systemically which is great news and I do believe that 10-08-2022 upon evaluation today patient's wounds actually are showing some signs of improvement which is great news. Still this is very slow to heal I really feel like he would do better if we can get him in some stronger compression wrapping he does have the juxta lite compression wraps therefore we can actually see about doing that over top of his Tubigrip to see if that can be beneficial. He does not love the hide he had as they are not the most comfortable thing for him but nonetheless I think it would be beneficial he is in agreement with going forward with this. 10-15-2022 upon evaluation today patient appears to be doing well currently in regard to his wounds that do not appear to be infected which is good news. With that being said unfortunately he still  continues to have significant amount  of swelling we really need to do something to try to get the swelling under control. I do not see any signs of infection locally nor systemically but at the same time the amount of edema is tremendous. The juxta lite helps but again he is only taking the fluid pills once or twice a week due to the amount that makes him go to the restroom. Therefore he does not take this daily which is really what he needs on a more regular basis. We need to try to get some compression on and is a little bit stronger. 10-22-2022 upon evaluation today patient actually appears to be making some really good progress in regard to his wound. The compression wrap was put on last time actually doing a great job he looks much improved and in general I feel like that we are making great progress. I do not see any evidence of active infection locally nor systemically which is great news. 10-29-2022 upon evaluation today patient appears to be doing decently well in regard to his legs currently. I feel like that his swelling is much better I feel like that he is also doing much better in regard to the wounds in general. I do not see any signs of active infection which is great news and in general I think that he is making excellent progress towards complete resolution. The patient does seem to have 1 issue that is with his wraps actually slipping down working I definitely need to work on that aspect. 11-05-2022 upon evaluation today patient appears to be doing excellent in regard to his wound. He has been tolerating the dressing changes without complication. He actually seems to be making excellent progress I am extremely pleased with where things stand currently. I do believe that we are really making good progress and I think that we are on the right track towards complete closure I think the compression wrap has been a dramatic shift in a good way for him as far as getting the wounds healed a lot  of these areas that we will continue to leave Completely sealed up. 11-12-2022 upon evaluation today patient appears to be doing well currently in regard to his leg ulcer. The compression wrapping seems to be doing an excellent job. Fortunately I do not see any evidence of active infection locally nor systemically which is great news and in general I do believe that we are making good headway here towards complete closure. 11-19-2022 upon evaluation today patient appears to be doing well currently in regard to his wounds. He is actually showing signs of excellent improvement and very pleased with where we stand I think that he is making great progress. I do not see any evidence of infection at this time which is great news. 11-26-2022 upon evaluation today patient appears to be doing well currently in regard to his wound. He is actually been tolerating the dressing changes without complication. Fortunately I do not see any evidence of active infection locally nor systemically which is great news I think he is making excellent progress here towards closure. 12-03-2022 upon evaluation today patient appears to be doing well currently in regard to his wound. He in fact at both locations is doing great and seems to be making excellent progress and very pleased in that regard. I do not see any signs of active infection at this time. 12-10-2022 upon evaluation patient's wound is actually significantly smaller with one of the satellite lesions closed and there is just 1 small  area remaining and this is actually doing quite well. I am very pleased with her progress. 12-15-2022 upon evaluation today patient appears to be doing actually pretty well in regard to his wound on the left leg. He does have a little area on the backside that open that was previously close last week due to the fact that his wrap got wet over the weekend and he had to make do with stuff they can find at the drugstore. They made this work out  however and the good news is he actually seems to be doing significantly better and the wound looks fine except for the fact that he has a couple of areas that just reopen on the backside very tiny. Nonetheless I do believe that we can go ahead and see what we do about trying to get this moving in the right direction yet again. 12-22-2022 upon evaluation today patient appears to be doing poorly in regard to his wrap slid down and his leg is extremely swollen. It is also painful secondary to it being so swollen. Fortunately I do not see any evidence of infection right now but at the same time I do believe that if we do not get the swelling under control is not really good to alleviate his discomfort. We definitely can need to change his wrap out however in short order. 12-31-2022 upon evaluation today patient appears to be doing well currently in regard to his wound. He has been tolerating the dressing changes without complication. This actually looks to be doing much better today compared to where we were. Fortunately I do not see any evidence of active infection locally or systemically which is great news. 01-11-2023 upon evaluation today patient appears to be doing well currently in regard to his wound which is actually showing signs of significant improvement. Fortunately I do not see any signs of infection locally or systemically which is great news and in general I do believe that we are making good headway towards complete closure. 01-19-2023 upon evaluation today patient appears to be doing well currently hemoglobin to his wound. He has been tolerating the dressing changes without complication. Fortunately the wound is not infected unfortunately it is a little bigger due to the fact that the wrap slid down. I do not see any signs of worsening or infection at this time. 01-28-2023 upon evaluation today patient appears to be doing well currently in regard to his wound. He has been tolerating the  dressing changes without complication. Fortunately I do not see any signs of infection the compression wrap is doing a really good job and I think it worked pretty much just about healed. 02-04-2023 upon evaluation today patient appears to be doing excellent in regard to his wounds. In fact he is pretty much completely healed on the left although I think this may have just a pinpoint opening. Nonetheless I think it is 1 more week to toughen up. 02-16-2023 upon evaluation today patient appears to be doing well currently in regard to his wound. He has been tolerating the dressing changes without complication. Fortunately there does not appear to be any evidence of active infection locally or systemically which is good news. In fact the original wounds we been taking care of on the left and right legs are completely healed unfortunately his wrap slipped down the right leg and there are 2 small areas on the anterior portion of his leg which are open at this point. When I wrapped his right leg the  left leg is completely closed however he is in agreement to his own compression sock. Electronic Signature(s) Signed: 02/16/2023 2:20:06 PM By: Allen Derry PA-C Entered By: Allen Derry on [name]/08/2022 11:20:06 [name] ([id number]) [id number] Page 4 of 10 -------------------------------------------------------------------------------- Physical Exam Details Patient Name: Date of Service: [name] 02/16/2023 11:00 A M Medical Record Number: [id number] Patient Account Number: [id number] Date of Birth/Sex: Treating RN: [birth year]/[name] (66 y.o. [name]) Yevonne Pax Primary Care Provider: [name] Other Clinician: Referring Provider: Treating Provider/Extender: Matthew Folks in Treatment: 30 Constitutional Obese and well-hydrated in no acute distress. Respiratory normal breathing without difficulty. Psychiatric this patient is able to make decisions and  demonstrates good insight into disease process. Alert and Oriented x 3. pleasant and cooperative. Notes Upon inspection patient's wound bed actually showed signs of good granulation and physician at this point. Fortunately there does not appear to be any signs of active infection locally or systemically which is great news and in general I do believe that we are making headway towards closure in fact his original wounds are healed he has a new wound really to areas of burn on the anterior right leg which are open at this point. This is due to his wrap slipping down completely unrelated to the posterior leg wound which is completely healed. Electronic Signature(s) Signed: 02/16/2023 2:20:54 PM By: Allen Derry PA-C Entered By: Allen Derry on [name]/08/2022 11:20:54 -------------------------------------------------------------------------------- Physician Orders Details Patient Name: Date of Service: Matthew Burly [name] 02/16/2023 11:00 A M Medical Record Number: [id number] Patient Account Number: [id number] Date of Birth/Sex: Treating RN: [name], [name] (66 y.o. [name]) Yevonne Pax Primary Care Provider: [name] Other Clinician: Referring Provider: Treating Provider/Extender: Matthew Folks in Treatment: 30 Verbal / Phone Orders: No Diagnosis Coding ICD-10 Coding Code Description E11.622 Type 2 diabetes mellitus with other skin ulcer I89.0 Lymphedema, not elsewhere classified L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 L97.812 Non-pressure chronic ulcer of other part of right lower leg with fat layer exposed I10 Essential (primary) hypertension I50.42 Chronic combined systolic (congestive) and diastolic (congestive) heart failure [name], [name] ([id number]) [id number] Page 5 of 10 I49.9 Cardiac arrhythmia, unspecified Follow-up Appointments Return Appointment in 1 week. Bathing/ Shower/ Hygiene May  shower with wound dressing protected with water repellent cover or cast protector. Anesthetic (Use 'Patient Medications' Section for Anesthetic Order Entry) Lidocaine applied to wound bed Edema Control - Lymphedema / Segmental Compressive Device / Other Elevate, Exercise Daily and A void Standing for Long Periods of Time. Elevate legs to the level of the heart and pump ankles as often as possible Elevate leg(s) parallel to the floor when sitting. Compression Pump: Use compression pump on left lower extremity for 60 minutes, twice daily. Compression Pump: Use compression pump on right lower extremity for 60 minutes, twice daily. DO YOUR BEST to sleep in the bed at night. DO NOT sleep in your recliner. Long hours of sitting in a recliner leads to swelling of the legs and/or potential wounds on your backside. Wound Treatment Wound #3 - Lower Leg Wound Laterality: Right Cleanser: Soap and Water 1 x Per Week/30 Days Discharge Instructions: Gently cleanse wound with antibacterial soap, rinse and pat dry prior to dressing wounds Peri-Wound Care: AandD Ointment 1 x Per Week/30 Days Discharge Instructions: Apply AandD Ointment to leg Peri-Wound Care: Desitin Maximum Strength Ointment 4 (oz) 1 x Per Week/30 Days Discharge Instructions: Apply around wound  edges to avoid maceration Prim Dressing: Silvercel Small 2x2 (in/in) 1 x Per Week/30 Days ary Discharge Instructions: Apply Silvercel Small 2x2 (in/in) as instructed Secondary Dressing: Gauze 1 x Per Week/30 Days Discharge Instructions: As directed: dry, moistened with saline or moistened with Dakins Solution Compression Wrap: Urgo K2 Lite, two layer compression system, large 1 x Per Week/30 Days Electronic Signature(s) Signed: 02/16/2023 2:02:37 PM By: Yevonne Pax RN Signed: 02/19/2023 2:02:35 PM By: Allen Derry PA-C Entered By: Yevonne Pax on [name]/08/2022  11:02:37 -------------------------------------------------------------------------------- Problem List Details Patient Name: Date of Service: Matthew Burly [name] 02/16/2023 11:00 A M Medical Record Number: [id number] Patient Account Number: [id number] Date of Birth/Sex: Treating RN: [birth year] (66 y.o. [name] Primary Care Provider: [name] Other Clinician: Referring Provider: Treating Provider/Extender: Matthew Folks in Treatment: 30 Active Problems ICD-10 Encounter Code Description Active Date MDM Diagnosis E11.622 Type 2 diabetes mellitus with other skin ulcer 07/16/2022 No Yes KANON, RAUSCH ([id number]) [id number] Page 6 of 10 I89.0 Lymphedema, not elsewhere classified 07/16/2022 No Yes L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2/06/2022 No Yes L97.812 Non-pressure chronic ulcer of other part of right lower leg with fat layer 07/16/2022 No Yes exposed I10 Essential (primary) hypertension 07/16/2022 No Yes I50.42 Chronic combined systolic (congestive) and diastolic (congestive) heart failure 07/16/2022 No Yes I49.9 Cardiac arrhythmia, unspecified 07/16/2022 No Yes Inactive Problems Resolved Problems Electronic Signature(s) Signed: 02/16/2023 11:21:42 AM By: Allen Derry PA-C Entered By: Allen Derry on [name]/08/2022 08:21:42 -------------------------------------------------------------------------------- Progress Note Details Patient Name: Date of Service: [name], Matthew [name] 02/16/2023 11:00 A M Medical Record Number: [id number] Patient Account Number: [id number] Date of Birth/Sex: Treating RN: February [name], [name] (66 y.o. [name]) Yevonne Pax Primary Care Provider: [name] Other Clinician: Referring Provider: Treating Provider/Extender: Matthew Folks in Treatment: 30 Subjective Chief Complaint Information obtained from Patient Bilateral LE ulcers and lymphedema History  of Present Illness (HPI) 07-16-2022 upon evaluation today patient appears to be doing poorly currently in regard to his his legs. The left leg is actually worse than the right. This is a patient that is previously been seen in the [place] office and at that point was doing really quite well with compression wrapping and often alginate are either Hydrofera Blue dressings. Fortunately he has gone since November 2020 until just current with out having any issue or need for wound care services which is great news. Unfortunately he is here today because he is having some issues at this point. Patient does have a history of several medical conditions which include diabetes mellitus type 2, lymphedema, hypertension, congestive heart failure, and cardiac arrhythmia though is not sure that this is actually atrial fibrillation based on what he has been told. He does have lymphedema pumps but tells me he has not used them since the summer when he had to move to a remodel of his building and subsequently never unpacked them. 07-23-2022 upon evaluation today patient appears to be doing well with regard to his legs. He is going require some sharp debridement today but overall seems to be making some progress. I am pleased in that regard he has much less drainage that he had did last week I think the compression wraps are definitely [name], [name] ([id number]) [id number] Page 7 of 10 helping. 07-30-2022 upon evaluation today patient appears to be doing poorly in regard to his legs he actually has blue-green drainage which I think is consistent with  Pseudomonas. I believe that we need to address this as soon as possible and I discussed that with the patient today as well. Fortunately I do not see any signs of active infection locally nor systemically at this time which is great news. No fevers, chills, nausea, vomiting, or diarrhea. 08-13-2022 upon evaluation today patient appears to be  doing well currently in regard to his leg ulcers which are actually looking much better. Fortunately there does not appear to be any signs of active infection at this time which is great news. No fevers, chills, nausea, vomiting, or diarrhea. 3/7; patient with predominantly a left medial lower extremity wound as well as several smaller areas and a small area on the right lateral. His Jodie Echevaria came in today. We are using silver alginate as the primary dressing 09-03-2022 upon evaluation today patient appears to be doing well currently in regard to his wounds. He has been tolerating the dressing changes without complication. Fortunately there does not appear to be any signs of infection there is needed however for sharp debridement. 09-10-2022 upon evaluation today patient appears to be doing well currently in regard to his wound. Has been tolerating the dressing changes without complication. Fortunately there does not appear to be any signs of infection looking systemically which is great news and overall I am extremely pleased with where we stand today. I do not see any signs of infection. 09-17-2022 upon evaluation today patient actually is making signs of improvement the good news is he is making good improvement. With that being said he still is having quite a time keeping the swelling down. We have been using Tubigrip I think that still probably our best option but nonetheless I think that we are making progress with regard to his wounds. 09-24-2022 upon evaluation today patient appears to be doing well currently in regard to his wounds is continuing to make improvements at this point which is great news and overall I am extremely pleased with where we stand today. Fortunately I do not see any evidence of active infection at this time which is great news and in general I think he is doing quite well with the [facility] and silver alginate. 10-01-2022 upon evaluation today patient appears to be doing  pretty well currently in regard to his wounds. He has been tolerating the dressing changes without complication. Fortunately I do not see any evidence of active infection locally nor systemically which is great news and I do believe that 10-08-2022 upon evaluation today patient's wounds actually are showing some signs of improvement which is great news. Still this is very slow to heal I really feel like he would do better if we can get him in some stronger compression wrapping he does have the juxta lite compression wraps therefore we can actually see about doing that over top of his Tubigrip to see if that can be beneficial. He does not love the hide he had as they are not the most comfortable thing for him but nonetheless I think it would be beneficial he is in agreement with going forward with this. 10-15-2022 upon evaluation today patient appears to be doing well currently in regard to his wounds that do not appear to be infected which is good news. With that being said unfortunately he still continues to have significant amount of swelling we really need to do something to try to get the swelling under control. I do not see any signs of infection locally nor systemically but at the same time  the amount of edema is tremendous. The juxta lite helps but again he is only taking the fluid pills once or twice a week due to the amount that makes him go to the restroom. Therefore he does not take this daily which is really what he needs on a more regular basis. We need to try to get some compression on and is a little bit stronger. 10-22-2022 upon evaluation today patient actually appears to be making some really good progress in regard to his wound. The compression wrap was put on last time actually doing a great job he looks much improved and in general I feel like that we are making great progress. I do not see any evidence of active infection locally nor systemically which is great news. 10-29-2022 upon  evaluation today patient appears to be doing decently well in regard to his legs currently. I feel like that his swelling is much better I feel like that he is also doing much better in regard to the wounds in general. I do not see any signs of active infection which is great news and in general I think that he is making excellent progress towards complete resolution. The patient does seem to have 1 issue that is with his wraps actually slipping down working I definitely need to work on that aspect. 11-05-2022 upon evaluation today patient appears to be doing excellent in regard to his wound. He has been tolerating the dressing changes without complication. He actually seems to be making excellent progress I am extremely pleased with where things stand currently. I do believe that we are really making good progress and I think that we are on the right track towards complete closure I think the compression wrap has been a dramatic shift in a good way for him as far as getting the wounds healed a lot of these areas that we will continue to leave Completely sealed up. 11-12-2022 upon evaluation today patient appears to be doing well currently in regard to his leg ulcer. The compression wrapping seems to be doing an excellent job. Fortunately I do not see any evidence of active infection locally nor systemically which is great news and in general I do believe that we are making good headway here towards complete closure. 11-19-2022 upon evaluation today patient appears to be doing well currently in regard to his wounds. He is actually showing signs of excellent improvement and very pleased with where we stand I think that he is making great progress. I do not see any evidence of infection at this time which is great news. 11-26-2022 upon evaluation today patient appears to be doing well currently in regard to his wound. He is actually been tolerating the dressing changes without complication. Fortunately I do  not see any evidence of active infection locally nor systemically which is great news I think he is making excellent progress here towards closure. 12-03-2022 upon evaluation today patient appears to be doing well currently in regard to his wound. He in fact at both locations is doing great and seems to be making excellent progress and very pleased in that regard. I do not see any signs of active infection at this time. 12-10-2022 upon evaluation patient's wound is actually significantly smaller with one of the satellite lesions closed and there is just 1 small area remaining and this is actually doing quite well. I am very pleased with her progress. 12-15-2022 upon evaluation today patient appears to be doing actually pretty well in regard to  his wound on the left leg. He does have a little area on the backside that open that was previously close last week due to the fact that his wrap got wet over the weekend and he had to make do with stuff they can find at the drugstore. They made this work out however and the good news is he actually seems to be doing significantly better and the wound looks fine except for the fact that he has a couple of areas that just reopen on the backside very tiny. Nonetheless I do believe that we can go ahead and see what we do about trying to get this moving in the right direction yet again. 12-22-2022 upon evaluation today patient appears to be doing poorly in regard to his wrap slid down and his leg is extremely swollen. It is also painful secondary to it being so swollen. Fortunately I do not see any evidence of infection right now but at the same time I do believe that if we do not get the swelling under control is not really good to alleviate his discomfort. We definitely can need to change his wrap out however in short order. 12-31-2022 upon evaluation today patient appears to be doing well currently in regard to his wound. He has been tolerating the dressing changes  without complication. This actually looks to be doing much better today compared to where we were. Fortunately I do not see any evidence of active infection locally or systemically which is great news. 01-11-2023 upon evaluation today patient appears to be doing well currently in regard to his wound which is actually showing signs of significant improvement. Fortunately I do not see any signs of infection locally or systemically which is great news and in general I do believe that we are making good headway towards complete closure. 01-19-2023 upon evaluation today patient appears to be doing well currently hemoglobin to his wound. He has been tolerating the dressing changes without complication. Fortunately the wound is not infected unfortunately it is a little bigger due to the fact that the wrap slid down. I do not see any signs of worsening [name], [name] ([id number]) [phone].pdf Page 8 of 10 or infection at this time. 01-28-2023 upon evaluation today patient appears to be doing well currently in regard to his wound. He has been tolerating the dressing changes without complication. Fortunately I do not see any signs of infection the compression wrap is doing a really good job and I think it worked pretty much just about healed. 02-04-2023 upon evaluation today patient appears to be doing excellent in regard to his wounds. In fact he is pretty much completely healed on the left although I think this may have just a pinpoint opening. Nonetheless I think it is 1 more week to toughen up. 02-16-2023 upon evaluation today patient appears to be doing well currently in regard to his wound. He has been tolerating the dressing changes without complication. Fortunately there does not appear to be any evidence of active infection locally or systemically which is good news. In fact the original wounds we been taking care of on the left and right legs are completely healed unfortunately  his wrap slipped down the right leg and there are 2 small areas on the anterior portion of his leg which are open at this point. When I wrapped his right leg the left leg is completely closed however he is in agreement to his own compression sock. Objective Constitutional Obese and well-hydrated in no  acute distress. Vitals Time Taken: 11:10 AM, Height: 73 in, Weight: 358 lbs, BMI: 47.2, Temperature: 97.9 F, Pulse: 56 bpm, Respiratory Rate: 18 breaths/min, Blood Pressure: 121/71 mmHg. Respiratory normal breathing without difficulty. Psychiatric this patient is able to make decisions and demonstrates good insight into disease process. Alert and Oriented x 3. pleasant and cooperative. General Notes: Upon inspection patient's wound bed actually showed signs of good granulation and physician at this point. Fortunately there does not appear to be any signs of active infection locally or systemically which is great news and in general I do believe that we are making headway towards closure in fact his original wounds are healed he has a new wound really to areas of burn on the anterior right leg which are open at this point. This is due to his wrap slipping down completely unrelated to the posterior leg wound which is completely healed. Integumentary (Hair, Skin) Wound #2 status is Healed - Epithelialized. Original cause of wound was Gradually Appeared. The date acquired was: 06/15/2022. The wound has been in treatment 30 weeks. The wound is located on the Left,Medial Lower Leg. The wound measures 0cm length x 0cm width x 0cm depth; 0cm^2 area and 0cm^3 volume. There is no tunneling or undermining noted. There is a none present amount of drainage noted. There is no granulation within the wound bed. There is no necrotic tissue within the wound bed. Wound #3 status is Open. Original cause of wound was Gradually Appeared. The date acquired was: 02/04/2023. The wound has been in treatment 1 weeks. The wound  is located on the Right Lower Leg. The wound measures 1cm length x 1cm width x 0.1cm depth; 0.785cm^2 area and 0.079cm^3 volume. There is Fat Layer (Subcutaneous Tissue) exposed. There is no tunneling or undermining noted. There is a medium amount of serosanguineous drainage noted. There is large (67-100%) granulation within the wound bed. There is a small (1-33%) amount of necrotic tissue within the wound bed including Adherent Slough. Assessment Active Problems ICD-10 Type 2 diabetes mellitus with other skin ulcer Lymphedema, not elsewhere classified Non-pressure chronic ulcer of other part of left lower leg with fat layer exposed Non-pressure chronic ulcer of other part of right lower leg with fat layer exposed Essential (primary) hypertension Chronic combined systolic (congestive) and diastolic (congestive) heart failure Cardiac arrhythmia, unspecified Procedures Wound #3 Pre-procedure diagnosis of Wound #3 is a Lymphedema located on the Right Lower Leg . There was a Excisional Skin/Subcutaneous Tissue Debridement with a total area of 0.78 sq cm performed by Allen Derry, PA-C. With the following instrument(s): Curette Material removed includes Eschar and Subcutaneous Tissue and. No specimens were taken. A time out was conducted at 11:35, prior to the start of the procedure. A Minimum amount of bleeding was controlled with Pressure. The procedure was tolerated well with a pain level of 0 throughout and a pain level of 0 following the procedure. Post Debridement Measurements: 1cm length x 1cm width x 0.1cm depth; 0.079cm^3 volume. Character of Wound/Ulcer Post Debridement is improved. Post procedure Diagnosis Wound #3: Same as Pre-Procedure [name], [name] ([id number]) [phone].pdf Page 9 of 10 Pre-procedure diagnosis of Wound #3 is a Lymphedema located on the Right Lower Leg . There was a Double Layer Compression Therapy Procedure by Yevonne Pax, RN. Post  procedure Diagnosis Wound #3: Same as Pre-Procedure Plan Follow-up Appointments: Return Appointment in 1 week. Bathing/ Shower/ Hygiene: May shower with wound dressing protected with water repellent cover or cast protector. Anesthetic (Use 'Patient Medications'  Section for Anesthetic Order Entry): Lidocaine applied to wound bed Edema Control - Lymphedema / Segmental Compressive Device / Other: Elevate, Exercise Daily and Avoid Standing for Long Periods of Time. Elevate legs to the level of the heart and pump ankles as often as possible Elevate leg(s) parallel to the floor when sitting. Compression Pump: Use compression pump on left lower extremity for 60 minutes, twice daily. Compression Pump: Use compression pump on right lower extremity for 60 minutes, twice daily. DO YOUR BEST to sleep in the bed at night. DO NOT sleep in your recliner. Long hours of sitting in a recliner leads to swelling of the legs and/or potential wounds on your backside. WOUND #3: - Lower Leg Wound Laterality: Right Cleanser: Soap and Water 1 x Per Week/30 Days Discharge Instructions: Gently cleanse wound with antibacterial soap, rinse and pat dry prior to dressing wounds Peri-Wound Care: AandD Ointment 1 x Per Week/30 Days Discharge Instructions: Apply AandD Ointment to leg Peri-Wound Care: Desitin Maximum Strength Ointment 4 (oz) 1 x Per Week/30 Days Discharge Instructions: Apply around wound edges to avoid maceration Prim Dressing: Silvercel Small 2x2 (in/in) 1 x Per Week/30 Days ary Discharge Instructions: Apply Silvercel Small 2x2 (in/in) as instructed Secondary Dressing: Gauze 1 x Per Week/30 Days Discharge Instructions: As directed: dry, moistened with saline or moistened with Dakins Solution Com pression Wrap: Urgo K2 Lite, two layer compression system, large 1 x Per Week/30 Days 1. Based on what I am seeing I do believe that the patient is making excellent headway towards closure I think we are getting  very close to being done. 2. I am going to recommend on the open areas that we have the patient go ahead and use a silver alginate dressing this is on the anterior lower leg he is in agreement with that plan. We will see patient back for reevaluation in 1 week here in the clinic. If anything worsens or changes patient will contact our office for additional recommendations. Electronic Signature(s) Signed: 02/16/2023 2:21:31 PM By: Allen Derry PA-C Entered By: Allen Derry on [name]/08/2022 11:21:31 -------------------------------------------------------------------------------- SuperBill Details Patient Name: Date of Service: [name] 02/16/2023 Medical Record Number: [id number] Patient Account Number: [id number] Date of Birth/Sex: Treating RN: [birth year] [name] (66 y.o. [name]) Yevonne Pax Primary Care Provider: [name] Other Clinician: Referring Provider: Treating Provider/Extender: Matthew Folks in Treatment: 30 Diagnosis Coding ICD-10 Codes Code Description E11.622 Type 2 diabetes mellitus with other skin ulcer I89.0 Lymphedema, not elsewhere classified FRANCHESCO, DUBRAY ([id number]) [phone].pdf Page 10 of 10 [id number] Non-pressure chronic ulcer of other part of left lower leg with fat layer exposed L97.812 Non-pressure chronic ulcer of other part of right lower leg with fat layer exposed I10 Essential (primary) hypertension I50.42 Chronic combined systolic (congestive) and diastolic (congestive) heart failure I49.9 Cardiac arrhythmia, unspecified Facility Procedures : CPT4 Code: [id number] Description: 42595 - DEBRIDE WOUND 1ST 20 SQ CM OR < ICD-10 Diagnosis Description L97.812 Non-pressure chronic ulcer of other part of right lower leg with fat layer expos Modifier: ed Quantity: 1 Physician Procedures : CPT4 Code Description Modifier [id number] - WC PHYS DEBR WO ANESTH 20 SQ CM ICD-10 Diagnosis Description L97.812 Non-pressure chronic  ulcer of other part of right lower leg with fat layer exposed Quantity: 1 Electronic Signature(s) Signed: 02/16/2023 2:22:29 PM By: Allen Derry PA-C Previous Signature: 02/16/2023 2:11:23 PM Version By: Yevonne Pax RN Previous Signature: 02/16/2023 2:03:20 PM Version By: Yevonne Pax RN Entered By: Allen Derry on [name]/08/2022  11:22:29 

## 2023-02-18 NOTE — Progress Notes (Signed)
[name], [name] ([id number]) [id number] Page 1 of 10 Visit Report for 02/16/2023 Arrival Information Details Patient Name: Date of Service: [name] 02/16/2023 11:00 A M Medical Record Number: [id number] Patient Account Number: [id number] Date of Birth/Sex: Treating RN: [birth year] (66 y.o. [name]) [name] Primary Care [name]: [name] Other Clinician: Referring [name]: Treating [name]/Extender: [name] in Treatment: 30 Visit Information History Since Last Visit Added or deleted any medications: No Patient Arrived: [name] Any new allergies or adverse reactions: No Arrival Time: 11:09 Had a fall or experienced change in No Accompanied By: self activities of daily living that may affect Transfer Assistance: None risk of falls: Patient Identification Verified: Yes Signs or symptoms of abuse/neglect since last visito No Secondary Verification Process Completed: Yes Hospitalized since last visit: No Patient Requires Transmission-Based Precautions: No Implantable device outside of the clinic excluding No Patient Has Alerts: No cellular tissue based products placed in the center since last visit: Has Dressing in Place as Prescribed: Yes Has Compression in Place as Prescribed: Yes Pain Present Now: No Electronic Signature(s) Signed: 02/18/2023 1:40:54 PM By: Matthew Pax RN Entered By: [name] on 02/16/2023 11:10:24 -------------------------------------------------------------------------------- Clinic Level of Care Assessment Details Patient Name: Date of Service: [name] 02/16/2023 11:00 A M Medical Record Number: [id number] Patient Account Number: [id number] Date of Birth/Sex: Treating RN: May 18, 1956 (66 y.o. [name]) [name] Primary Care [name]: [name] Other Clinician: Referring [name]: Treating [name]/Extender: [name] in Treatment: 30 Clinic Level of Care  Assessment Items TOOL 1 Quantity Score []  - 0 Use when EandM and Procedure is performed on INITIAL visit ASSESSMENTS - Nursing Assessment / Reassessment []  - 0 General Physical Exam (combine w/ comprehensive assessment (listed just below) when performed on new pt. evals) []  - 0 Comprehensive Assessment (HX, ROS, Risk Assessments, Wounds Hx, etc.) [name], [name] ([id number]) [id number] Page 2 of 10 ASSESSMENTS - Wound and Skin Assessment / Reassessment []  - 0 Dermatologic / Skin Assessment (not related to wound area) ASSESSMENTS - Ostomy and/or Continence Assessment and Care []  - 0 Incontinence Assessment and Management []  - 0 Ostomy Care Assessment and Management (repouching, etc.) PROCESS - Coordination of Care []  - 0 Simple Patient / Family Education for ongoing care []  - 0 Complex (extensive) Patient / Family Education for ongoing care []  - 0 Staff obtains Chiropractor, Records, T Results / Process Orders est []  - 0 Staff telephones HHA, Nursing Homes / Clarify orders / etc []  - 0 Routine Transfer to another Facility (non-emergent condition) []  - 0 Routine Hospital Admission (non-emergent condition) []  - 0 New Admissions / Manufacturing engineer / Ordering NPWT Apligraf, etc. , []  - 0 Emergency Hospital Admission (emergent condition) PROCESS - Special Needs []  - 0 Pediatric / Minor Patient Management []  - 0 Isolation Patient Management []  - 0 Hearing / Language / Visual special needs []  - 0 Assessment of Community assistance (transportation, D/C planning, etc.) []  - 0 Additional assistance / Altered mentation []  - 0 Support Surface(s) Assessment (bed, cushion, seat, etc.) INTERVENTIONS - Miscellaneous []  - 0 External ear exam []  - 0 Patient Transfer (multiple staff / Nurse, adult / Similar devices) []  - 0 Simple Staple / Suture removal (25 or less) []  - 0 Complex Staple / Suture removal (26 or more) []  - 0 Hypo/Hyperglycemic  Management (do not check if billed separately) []  - 0 Ankle / Brachial Index (ABI) - do not check if billed separately Has  the patient been seen at the hospital within the last three years: Yes Total Score: 0 Level Of Care: ____ Electronic Signature(s) Signed: 02/18/2023 1:40:54 PM By: Matthew Pax RN Entered By: [name] on 02/16/2023 14:02:50 -------------------------------------------------------------------------------- Compression Therapy Details Patient Name: Date of Service: Matthew Burly [name] 02/16/2023 11:00 A M Medical Record Number: [id number] Patient Account Number: [id number] Date of Birth/Sex: Treating RN: 11-24-1955 (66 y.o. [name] Primary Care Yomayra Tate: [name] Other Clinician: Referring [name]: Treating Kemani Heidel/Extender: Kashif, Polak ([id number]) [id number] Page 3 of 10 Weeks in Treatment: 30 Compression Therapy Performed for Wound Assessment: Wound #3 Right Lower Leg Performed By: Clinician Matthew Pax, RN Compression Type: Double Layer Post Procedure Diagnosis Same as Pre-procedure Electronic Signature(s) Signed: 02/16/2023 2:01:12 PM By: Matthew Pax RN Entered By: [name] on 02/16/2023 14:01:11 -------------------------------------------------------------------------------- Encounter Discharge Information Details Patient Name: Date of Service: Matthew Burly [name] 02/16/2023 11:00 A M Medical Record Number: [id number] Patient Account Number: [id number] Date of Birth/Sex: Treating RN: 05-Jan-1956 (66 y.o. [name] Primary Care [name]: [name] Other Clinician: Referring [name]: Treating [name] in Treatment: 30 Encounter Discharge Information Items Discharge Condition: Stable Ambulatory Status: Cane Discharge Destination: Home Transportation: Private Auto Accompanied By: self Schedule Follow-up Appointment:  Yes Clinical Summary of Care: Electronic Signature(s) Signed: 02/16/2023 2:07:29 PM By: Matthew Pax RN Entered By: [name] on 02/16/2023 14:07:28 -------------------------------------------------------------------------------- Lower Extremity Assessment Details Patient Name: Date of Service: Matthew Burly [name] 02/16/2023 11:00 A M Medical Record Number: [id number] Patient Account Number: [id number] Date of Birth/Sex: Treating RN: Jan 27, 1956 (66 y.o. [name]) [name] Primary Care Keyion Knack: [name] Other Clinician: Referring Karlissa Aron: Treating Amerika Nourse/Extender: [name] in Treatment: 30 Edema Assessment G[Left: Jerrye Noble ([id number])] [name]: [id number] Page 4 of 10] Assessed: [Left: No] [Right: No] Edema: [Left: Yes] [Right: Yes] Calf Left: Right: Point of Measurement: 36 cm From Medial Instep 35 cm 67 cm Ankle Left: Right: Point of Measurement: 13 cm From Medial Instep 28 cm 31 cm Vascular Assessment Pulses: Dorsalis Pedis Palpable: [Left:Yes] [Right:Yes] Extremity colors, hair growth, and conditions: Extremity Color: [Left:Hyperpigmented] [Right:Hyperpigmented] Hair Growth on Extremity: [Left:Yes] [Right:Yes] Temperature of Extremity: [Left:Warm] [Right:Warm] Capillary Refill: [Left:< 3 seconds] [Right:< 3 seconds] Dependent Rubor: [Left:No] [Right:No] Blanched when Elevated: [Left:No No] [Right:No No] Toe Nail Assessment Left: Right: Thick: Yes Yes Discolored: Yes Yes Deformed: Yes Yes Improper Length and Hygiene: Yes Yes Electronic Signature(s) Signed: 02/18/2023 1:40:54 PM By: Matthew Pax RN Entered By: [name] on 02/16/2023 11:26:14 -------------------------------------------------------------------------------- Multi Wound Chart Details Patient Name: Date of Service: Matthew Burly [name] 02/16/2023 11:00 A M Medical Record Number: [id number] Patient Account Number: [id number] Date of Birth/Sex:  Treating RN: 1955-06-20 (66 y.o. [name]) [name] Primary Care Shavon Zenz: [name] Other Clinician: Referring Jodeci Roarty: Treating Numa Schroeter/Extender: [name] in Treatment: 30 Vital Signs Height(in): 73 Pulse(bpm): 56 Weight(lbs): 358 Blood Pressure(mmHg): 121/71 Body Mass Index(BMI): 47.2 Temperature(F): 97.9 Respiratory Rate(breaths/min): 18 [2:Photos:] [N/A:N/A [id number] Page 5 of 10] Left, Medial Lower Leg Right Lower Leg N/A Wound Location: Gradually Appeared Gradually Appeared N/A Wounding Event: Venous Leg Ulcer Lymphedema N/A Primary Etiology: Diabetic Wound/Ulcer of the Lower N/A N/A Secondary Etiology: Extremity Arrhythmia, Congestive Heart Failure, Arrhythmia, Congestive Heart Failure, N/A Comorbid History: Hypertension, Type II Diabetes Hypertension, Type II Diabetes 06/15/2022 02/04/2023 N/A Date Acquired: 30 1 N/A Weeks of Treatment: Healed - Epithelialized Open N/A Wound Status: No  No N/A Wound Recurrence: 0x0x0 1x1x0.1 N/A Measurements L x W x D (cm) 0 0.785 N/A A (cm) : rea 0 0.079 N/A Volume (cm) : 100.00% 35.90% N/A % Reduction in A rea: 100.00% 67.80% N/A % Reduction in Volume: Partial Thickness Partial Thickness N/A Classification: None Present Medium N/A Exudate A mount: N/A Serosanguineous N/A Exudate Type: N/A red, brown N/A Exudate Color: None Present (0%) Large (67-100%) N/A Granulation A mount: None Present (0%) Small (1-33%) N/A Necrotic A mount: Fascia: No Fat Layer (Subcutaneous Tissue): Yes N/A Exposed Structures: Fat Layer (Subcutaneous Tissue): No Fascia: No Tendon: No Tendon: No Muscle: No Muscle: No Joint: No Joint: No Bone: No Bone: No Large (67-100%) None N/A Epithelialization: Treatment Notes Electronic Signature(s) Signed: 02/16/2023 2:00:07 PM By: Matthew Pax RN Entered By: [name] on 02/16/2023  14:00:07 -------------------------------------------------------------------------------- Multi-Disciplinary Care Plan Details Patient Name: Date of Service: Matthew Burly [name] 02/16/2023 11:00 A M Medical Record Number: [id number] Patient Account Number: [id number] Date of Birth/Sex: Treating RN: 06/17/1955 (66 y.o. [name]) [name] Primary Care Bellami Farrelly: [name] Other Clinician: Referring Bernell Haynie: Treating Judah Carchi/Extender: [name] in Treatment: 30 Active Inactive Venous Leg Ulcer Nursing Diagnoses: Actual venous Insuffiency (use after diagnosis is confirmed) Goals: Patient will maintain optimal edema control Date Initiated: 10/15/2022 Target Resolution Date: 03/12/2023 Goal Status: Active Patient/caregiver will verbalize understanding of disease process and disease management Date Initiated: 10/15/2022 Date Inactivated: 11/12/2022 Target Resolution Date: 10/15/2022 [name], [name] ([id number]) [id number] Page 6 of 10 Goal Status: Met Verify adequate tissue perfusion prior to therapeutic compression application Date Initiated: 10/15/2022 Date Inactivated: 11/12/2022 Target Resolution Date: 10/15/2022 Goal Status: Met Interventions: Assess peripheral edema status every visit. Compression as ordered Provide education on venous insufficiency Treatment Activities: Therapeutic compression applied : 10/15/2022 Notes: Electronic Signature(s) Signed: 02/16/2023 2:03:53 PM By: Matthew Pax RN Entered By: [name] on 02/16/2023 14:03:52 -------------------------------------------------------------------------------- Pain Assessment Details Patient Name: Date of Service: Matthew Burly [name] 02/16/2023 11:00 A M Medical Record Number: [id number] Patient Account Number: [id number] Date of Birth/Sex: Treating RN: 10-20-1955 (66 y.o. [name]) [name] Primary Care Zyona Pettaway: [name] Other Clinician: Referring Cleopha Indelicato: Treating  Redding Cloe/Extender: [name] in Treatment: 30 Active Problems Location of Pain Severity and Description of Pain Patient Has Paino No Site Locations Pain Management and Medication Current Pain Management: Electronic Signature(s) Signed: 02/18/2023 1:40:54 PM By: Matthew Pax RN Entered By: [name] on 02/16/2023 11:12:56 [name] ([id number]) [id number] Page 7 of 10 -------------------------------------------------------------------------------- Patient/Caregiver Education Details Patient Name: Date of Service: [name] 9/3/2024andnbsp11:00 A M Medical Record Number: [id number] Patient Account Number: [id number] Date of Birth/Gender: Treating RN: [birth year] (66 y.o. [name]) [name] Primary Care Physician: [name] Other Clinician: Referring Physician: Treating Physician/Extender: [name] in Treatment: 30 Education Assessment Education Provided To: Patient Education Topics Provided Wound/Skin Impairment: Handouts: Other: compression Methods: Explain/Verbal Responses: State content correctly Electronic Signature(s) Signed: 02/18/2023 1:40:54 PM By: Matthew Pax RN Entered By: [name] on 02/16/2023 14:05:14 -------------------------------------------------------------------------------- Wound Assessment Details Patient Name: Date of Service: Matthew Burly [name] 02/16/2023 11:00 A M Medical Record Number: [id number] Patient Account Number: [id number] Date of Birth/Sex: Treating RN: [birth year] (66 y.o. [name]) [name] Primary Care Cruze Zingaro: [name] Other Clinician: Referring Birtha Hatler: Treating Vollie Brunty/Extender: [name] in Treatment: 30 Wound Status Wound Number: 2 Primary Etiology: Venous Leg Ulcer Wound Location: Left, Medial Lower Leg Secondary Diabetic Wound/Ulcer of the  Lower Extremity Etiology: Wounding Event: Gradually Appeared Wound  Status: Healed - Epithelialized Date Acquired: 06/15/2022 Comorbid Arrhythmia, Congestive Heart Failure, Hypertension, Type Weeks Of Treatment: 30 History: II Diabetes Clustered Wound: No Photos [name], [name] ([id number]) [phone].pdf Page 8 of 10 Wound Measurements Length: (cm) Width: (cm) Depth: (cm) Area: (cm) Volume: (cm) 0 % Reduction in Area: 100% 0 % Reduction in Volume: 100% 0 Epithelialization: Large (67-100%) 0 Tunneling: No 0 Undermining: No Wound Description Classification: Partial Thickness Exudate Amount: None Present Wound Bed Granulation Amount: None Present (0%) Exposed Structure Necrotic Amount: None Present (0%) Fascia Exposed: No Fat Layer (Subcutaneous Tissue) Exposed: No Tendon Exposed: No Muscle Exposed: No Joint Exposed: No Bone Exposed: No Treatment Notes Wound #2 (Lower Leg) Wound Laterality: Left, Medial Cleanser Peri-Wound Care Topical Primary Dressing Secondary Dressing Secured With Compression Wrap Compression Stockings Add-Ons Electronic Signature(s) Signed: 02/18/2023 1:40:54 PM By: Matthew Pax RN Entered By: [name] on 02/16/2023 11:21:13 -------------------------------------------------------------------------------- Wound Assessment Details Patient Name: Date of Service: Matthew Burly [name] 02/16/2023 11:00 A M Medical Record Number: [id number] Patient Account Number: [id number] [name], [name] ([id number]) [phone].pdf Page 9 of 10 Date of Birth/Sex: Treating RN: [birth year] (66 y.o. [name]) [name] Primary Care Oletha Tolson: Other Clinician: [name] Referring Chassie Pennix: Treating Arnitra Sokoloski/Extender: [name] in Treatment: 30 Wound Status Wound Number: 3 Primary Lymphedema Etiology: Wound Location: Right Lower Leg Wound Status: Open Wounding Event: Gradually Appeared Comorbid Arrhythmia, Congestive Heart Failure, Hypertension, Type II Date  Acquired: 02/04/2023 History: Diabetes Weeks Of Treatment: 1 Clustered Wound: No Photos Wound Measurements Length: (cm) 1 Width: (cm) 1 Depth: (cm) 0.1 Area: (cm) 0.785 Volume: (cm) 0.079 % Reduction in Area: 35.9% % Reduction in Volume: 67.8% Epithelialization: None Tunneling: No Undermining: No Wound Description Classification: Partial Thickness Exudate Amount: Medium Exudate Type: Serosanguineous Exudate Color: red, brown Foul Odor After Cleansing: No Slough/Fibrino Yes Wound Bed Granulation Amount: Large (67-100%) Exposed Structure Necrotic Amount: Small (1-33%) Fascia Exposed: No Necrotic Quality: Adherent Slough Fat Layer (Subcutaneous Tissue) Exposed: Yes Tendon Exposed: No Muscle Exposed: No Joint Exposed: No Bone Exposed: No Treatment Notes Wound #3 (Lower Leg) Wound Laterality: Right Cleanser Soap and Water Discharge Instruction: Gently cleanse wound with antibacterial soap, rinse and pat dry prior to dressing wounds Peri-Wound Care AandD Ointment Discharge Instruction: Apply AandD Ointment to leg Desitin Maximum Strength Ointment 4 (oz) Discharge Instruction: Apply around wound edges to avoid maceration Topical Primary Dressing Silvercel Small 2x2 (in/in) Discharge Instruction: Apply Silvercel Small 2x2 (in/in) as instructed Secondary Dressing Gauze Discharge Instruction: As directed: dry, moistened with saline or moistened with Dakins Solution [name], [name] ([id number]) [phone].pdf Page 10 of 10 Secured With Compression Wrap Urgo K2 Lite, two layer compression system, large Compression Stockings Add-Ons Electronic Signature(s) Signed: 02/18/2023 1:40:54 PM By: Matthew Pax RN Entered By: [name] on 02/16/2023 11:23:32 -------------------------------------------------------------------------------- Vitals Details Patient Name: Date of Service: Matthew Burly [name] 02/16/2023 11:00 A M Medical Record Number:  [id number] Patient Account Number: [id number] Date of Birth/Sex: Treating RN: 30-Jul-1955 (66 y.o. [name]) [name] Primary Care [name] [name]: [name] Other Clinician: Referring Britanni Yarde: Treating Daziah Hesler/Extender: [name] in Treatment: 30 Vital Signs Time Taken: 11:10 Temperature (F): 97.9 Height (in): 73 Pulse (bpm): 56 Weight (lbs): 358 Respiratory Rate (breaths/min): 18 Body Mass Index (BMI): 47.2 Blood Pressure (mmHg): 121/71 Reference Range: 80 - 120 mg / dl Electronic Signature(s) Signed: 02/18/2023 1:40:54 PM By: Matthew Pax RN Entered By: [name] on 02/16/2023  11:11:39 

## 2023-02-22 NOTE — Progress Notes (Unsigned)
Cardiology Office Note   Date:  02/22/2023   ID:  [name], [name] 1955-12-21, MRN [id number]  PCP:  [name], MD  Cardiologist:   Dietrich Pates, MD   Patient presents for f/u of CHF      History of Present Illness: [name] is a 67 y.o. male with a history of HTN, HL, DM, lymphedema, morbid obesity  Followed by Dr [name] The patient was admitted in March 2022 for dizziness, weakness  Echo at tjat time showed LVEF was severely reduced.  He went on to have a Land R heart cath   This showed no significant CAD and mild elevated LVEDP     Pt placed on medical Rx    [name] has been started .  In addtion a monitor showed 10.7% PVCs   Beause of palpitations he was placed on mexiletine 150 bid   The pt was initially seen at [facility]   Does not want to go back     Monitor in Sept 2022 showed SR   4% PVCs    I saw the pt in June 2023  Since seen he says his  breathing is OK   He denies dizziness  No CP     LEgs are bad   Going to wound care this Thursday Dr POlite is getting grant funding for [employer] Had 2 deaths in family this winter   still stressed   I saw athe pt in Jan 2024   No outpatient medications have been marked as taking for the 02/23/23 encounter (Appointment) with Pricilla Riffle, MD.     Allergies:   Patient has no known allergies.   Past Medical History:  Diagnosis Date   Arthritis    Diabetes mellitus without complication (HCC)    Hypertension    Lymphedema 09/09/2020   Wound drainage    right leg 3/4 inch area open wound with clear drainage last 2 days, covering with gauze prn    Past Surgical History:  Procedure Laterality Date   COLONOSCOPY  2014 ?   FLEXIBLE SIGMOIDOSCOPY N/A 08/27/2014   Procedure: FLEXIBLE SIGMOIDOSCOPY;  Surgeon: [name], MD;  Location: [facility];  Service: Endoscopy;  Laterality: N/A;   NO PAST SURGERIES     RIGHT/LEFT HEART CATH AND CORONARY ANGIOGRAPHY N/A 09/10/2020   Procedure: RIGHT/LEFT HEART CATH AND  CORONARY ANGIOGRAPHY;  Surgeon: [name], MD;  Location: [facility];  Service: Cardiovascular;  Laterality: N/A;     Social History:  The patient  reports that he quit smoking about 2 years ago. His smoking use included cigarettes. He started smoking about 44 years ago. He has a 63 pack-year smoking history. He has never used smokeless tobacco. He reports that he does not drink alcohol and does not use drugs.   Family History:  The patient's family history includes Heart attack in his father; Heart disease in his mother.    ROS:  Please see the history of present illness. All other systems are reviewed and  Negative to the above problem except as noted.    PHYSICAL EXAM: VS:  There were no vitals taken for this visit.  GEN: Morbidly obese 67 yo in no acute distress   Examined in chair HEENT: normal  Neck: no JVD, Cardiac: RRR; no murmurs, No gallops    LE  12+ edema  Wrapped  SOme oozing    Respiratory:  clear to auscultation bilaterally GI: soft, No hepatomegaly   Distended  MS: no deformity Moving all extremities   Skin: warm and dry,  Chornic skin changes legs  Neuro:  Strength and sensation are intact Psych: euthymic mood, full affect   EKG:  EKG is ordered today  SR 72 bpm  First degree AV block  PR 208 msec   RBBB\\  Monitor  02/14/21 SR  Average HR 78 bpm   Occsaional PVCs  4% total    Echo   02/06/21  Left ventricular ejection fraction, by estimation, is 45 to 50%. The left ventricle has mildly decreased function. The left ventricle demonstrates global hypokinesis. The left ventricular internal cavity size was mildly dilated. Left ventricular diastolic parameters were normal. 1. Right ventricular systolic function is moderately reduced. The right ventricular size is normal. 2. 3. Right atrial size was mildly dilated. The mitral valve is normal in structure. No evidence of mitral valve regurgitation. No evidence of mitral stenosis. 4. 5. The aortic valve is  normal in structure. Aortic valve regurgitation is not visualized.  Echo:  09/09/20  1. Left ventricular ejection fraction, by estimation, is 30%. The left ventricle has moderate to severely decreased function. The left ventricle demonstrates global hypokinesis. The left ventricular internal cavity size was moderately dilated. Left ventricular diastolic parameters are consistent with Grade I diastolic dysfunction (impaired relaxation). 2. Right ventricular systolic function was not well visualized. The right ventricular size is not well visualized. 3. The mitral valve is normal in structure. No evidence of mitral valve regurgitation. No evidence of mitral stenosis. 4. The aortic valve is tricuspid. Aortic valve regurgitation is not visualized. No aortic stenosis is present. 5. Aortic dilatation noted. There is mild dilatation of the aortic root, measuring 37 mm. 6. The inferior vena cava is dilated in size with >50% respiratory variability, suggesting right atrial pressure of 8 mmHg. 7. Technically difficult study with poor acoustic windows.  R and L heart cath   09/10/20  RA 12/11, mean 8 mmHg.  RA saturation 77%.  RV 37/5, EDP 10 mmHg.  PA 35/22, mean 28 mmHg.  PA saturation 81%.  PW 18/18, mean 16 mmHg.  Aortic saturation 98%. QT/QS 1.24 suggest insignificant left-to-right shunting.  May be at the right atrial level. Cardiac output 8.69, cardiac index 2.93 by Fick. LV 145/13, EDP 21 mmHg.  Aorta 112/25 with a mean of 91 mmHg. No pressure gradient across the aortic valve.   Angiographic data: LV: Dilated.  Global hypokinesis.  EF 30 to 35%.  EDP mildly elevated. Right dominant circulation.  No significant coronary artery disease with D1 being equivalent to ramus intermediate, large vessel.  All vessels are mild luminal irregularity.   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  He also has left to right shunt however may be clinically insignificant.  Shunt potentially could be at the level of right atrium.   Monitor   10/16/20  Preliminary Findings Patient had a min HR of 53 bpm, max HR of 212 bpm, and avg HR of 80 bpm. Predominant underlying rhythm was Sinus Rhythm. Bundle Branch Block/IVCD was present. 2 Ventricular Tachycardia runs occurred, the run with the fastest interval lasting 4 beats with a max rate of 121 bpm (avg 101 bpm); the run with the fastest interval was also the longest. 59 Supraventricular Tachycardia runs occurred, the run with the fastest interval lasting 5 beats with a max rate of 187 bpm, the longest lasting 17.5 secs with  an avg rate of 115 bpm. Isolated SVEs were occasional (1.2%, 16980), SVE Couplets were rare (<1.0%, 439), and SVE Triplets were rare (<1.0%, 80). Isolated VEs were frequent (10.7%, 147557), VE Couplets were occasional (1.1%, 7880), and VE Triplets were rare (<1.0%, 74). Ventricular Bigeminy and Trigeminy were present.  Lipid Panel    Component Value Date/Time   CHOL 158 04/15/2021 0917   TRIG 89 04/15/2021 0917   HDL 53 04/15/2021 0917   CHOLHDL 3.0 04/15/2021 0917   LDLCALC 88 04/15/2021 0917      Wt Readings from Last 3 Encounters:  07/13/22 (!) 358 lb (162.4 kg)  12/05/21 (!) 409 lb 9.6 oz (185.8 kg)  03/03/21 (!) 421 lb 3.2 oz (191.1 kg)      ASSESSMENT AND PLAN:  1  HFmrEF   LVEF 45 to 50%   Comfortable  Keep on current meds      2  HTN  BP is ok  Continue current meds   3  Lipds    LDL 72  HDL 54  Trig 80  Keep on lipitor     4  PVCs Last monitor showed 4% PVCs  Keep on Mexilitene        5  Lymphedema  Going to wound care   6 DM   Follows with Dr [name]  Plan to start Ozempic soon   Plan for f/u in late summer   Current medicines are reviewed at length with the patient today.  The patient does not have concerns regarding medicines.  Signed, Dietrich Pates, MD  02/22/2023 8:58 PM     [facility] [address] [place], [place], [place]  [address] Phone: [phone]; Fax: [phone]

## 2023-02-23 ENCOUNTER — Ambulatory Visit: Payer: PPO | Admitting: Physician Assistant

## 2023-02-23 ENCOUNTER — Encounter: Payer: Self-pay | Admitting: Internal Medicine

## 2023-02-23 ENCOUNTER — Other Ambulatory Visit: Payer: Self-pay | Admitting: *Deleted

## 2023-02-23 ENCOUNTER — Ambulatory Visit: Payer: PPO | Attending: Internal Medicine | Admitting: Internal Medicine

## 2023-02-23 VITALS — BP 120/82 | HR 45 | Ht 73.0 in | Wt >= 6400 oz

## 2023-02-23 DIAGNOSIS — I5020 Unspecified systolic (congestive) heart failure: Secondary | ICD-10-CM | POA: Diagnosis not present

## 2023-02-23 MED ORDER — ATORVASTATIN CALCIUM 40 MG PO TABS: 40.0000 mg | ORAL_TABLET | Freq: Every day | ORAL | 3 refills | Status: AC

## 2023-02-23 MED ORDER — SACUBITRIL-VALSARTAN 49-51 MG PO TABS: 1.0000 | ORAL_TABLET | Freq: Two times a day (BID) | ORAL | 3 refills | Status: DC

## 2023-02-23 MED ORDER — SACUBITRIL-VALSARTAN 49-51 MG PO TABS: 1.0000 | ORAL_TABLET | Freq: Two times a day (BID) | ORAL | 0 refills | Status: DC

## 2023-02-23 MED ORDER — SPIRONOLACTONE 25 MG PO TABS: 12.5000 mg | ORAL_TABLET | Freq: Every day | ORAL | 3 refills | Status: AC

## 2023-02-23 MED ORDER — ATORVASTATIN CALCIUM 40 MG PO TABS: 40.0000 mg | ORAL_TABLET | Freq: Every day | ORAL | 0 refills | Status: DC

## 2023-02-23 MED ORDER — SPIRONOLACTONE 25 MG PO TABS: 12.5000 mg | ORAL_TABLET | Freq: Every day | ORAL | 0 refills | Status: DC

## 2023-02-23 NOTE — Patient Instructions (Signed)
Medication Instructions:   *If you need a refill on your cardiac medications before your next appointment, please call your pharmacy*   Lab Work:  If you have labs (blood work) drawn today and your tests are completely normal, you will receive your results only by: MyChart Message (if you have MyChart) OR A paper copy in the mail If you have any lab test that is abnormal or we need to change your treatment, we will call you to review the results.   Testing/Procedures:    Follow-Up: At [facility], you and your health needs are our priority.  As part of our continuing mission to provide you with exceptional heart care, we have created designated Provider Care Teams.  These Care Teams include your primary Cardiologist (physician) and Advanced Practice Providers (APPs -  Physician Assistants and Nurse Practitioners) who all work together to provide you with the care you need, when you need it.  We recommend signing up for the patient portal called "MyChart".  Sign up information is provided on this After Visit Summary.  MyChart is used to connect with patients for Virtual Visits (Telemedicine).  Patients are able to view lab/test results, encounter notes, upcoming appointments, etc.  Non-urgent messages can be sent to your provider as well.   To learn more about what you can do with MyChart, go to [email or web address].    Your next appointment:  April 2025 with Dr Tenny Craw

## 2023-02-24 ENCOUNTER — Telehealth: Payer: Self-pay | Admitting: Internal Medicine

## 2023-02-24 MED ORDER — SACUBITRIL-VALSARTAN 49-51 MG PO TABS
1.0000 | ORAL_TABLET | Freq: Two times a day (BID) | ORAL | 3 refills | Status: AC
  Filled 2023-11-24 – 2023-11-29 (×3): qty 180, 90d supply, fill #0

## 2023-02-24 NOTE — Telephone Encounter (Signed)
Patient calling the office for samples of medication:   1.  What medication and dosage are you requesting samples for?   sacubitril-valsartan (ENTRESTO) 49-51 MG    2.  Are you currently out of this medication?  Yes   

## 2023-02-24 NOTE — Telephone Encounter (Signed)
Pt is enrolled in patient assistance and is waiting on his order to come in the mail.  Pt requesting samples of Entresto 49/51 bid.  Was able to supply pt 1 bottle.  Pt was very appreciative of the assistance.

## 2023-02-26 ENCOUNTER — Encounter: Payer: PPO | Admitting: Physician Assistant

## 2023-02-26 DIAGNOSIS — I890 Lymphedema, not elsewhere classified: Secondary | ICD-10-CM | POA: Diagnosis not present

## 2023-02-26 DIAGNOSIS — E11622 Type 2 diabetes mellitus with other skin ulcer: Secondary | ICD-10-CM | POA: Diagnosis not present

## 2023-02-26 DIAGNOSIS — L97812 Non-pressure chronic ulcer of other part of right lower leg with fat layer exposed: Secondary | ICD-10-CM | POA: Diagnosis not present

## 2023-02-26 NOTE — Progress Notes (Addendum)
is not really good to alleviate his discomfort. We definitely can need to change his wrap out however in short order. 12-31-2022 upon evaluation today patient appears to be doing well currently in regard to his wound. He has been tolerating the dressing changes without complication. This actually looks to be doing much better today compared to where we were. Fortunately I do not see any evidence of active infection locally or systemically which is great news. 01-11-2023 upon evaluation today patient appears to be doing well currently in regard to his wound which is actually showing signs of significant improvement. Fortunately I do not see any signs of infection locally or systemically which is great news and in general I do believe that we are making good headway towards complete closure. 01-19-2023 upon evaluation today patient appears to be doing well currently hemoglobin to his wound. He has been tolerating the dressing changes without complication. Fortunately the wound is  not infected unfortunately it is a little bigger due to the fact that the wrap slid down. I do not see any signs of worsening or infection at this time. 01-28-2023 upon evaluation today patient appears to be doing well currently in regard to his wound. He has been tolerating the dressing changes without complication. Fortunately I do not see any signs of infection the compression wrap is doing a really good job and I think it worked pretty much just about healed. [name], [name] ([id number]) [id number] Page 3 of 9 02-04-2023 upon evaluation today patient appears to be doing excellent in regard to his wounds. In fact he is pretty much completely healed on the left although I think this may have just a pinpoint opening. Nonetheless I think it is 1 more week to toughen up. 02-16-2023 upon evaluation today patient appears to be doing well currently in regard to his wound. He has been tolerating the dressing changes without complication. Fortunately there does not appear to be any evidence of active infection locally or systemically which is good news. In fact the original wounds we been taking care of on the left and right legs are completely healed unfortunately his wrap slipped down the right leg and there are 2 small areas on the anterior portion of his leg which are open at this point. When I wrapped his right leg the left leg is completely closed however he is in agreement to his own compression sock. 02-26-2023 upon evaluation today patient appears to be doing well currently in regard to his left leg which is still close in the right leg is doing significantly better. Fortunately there does not appear to be any signs of active infection locally or systemically which is great news and in general I do believe that we will making headway towards complete closure. Electronic Signature(s) Signed: 02/26/2023 10:18:28 AM By: Allen Derry PA-C Entered By: Allen Derry on 02/26/2023  10:18:28 -------------------------------------------------------------------------------- Physical Exam Details Patient Name: Date of Service: [name], Matthew [name] 02/26/2023 9:45 A M Medical Record Number: [id number] Patient Account Number: [id number] Date of Birth/Sex: Treating RN: [birth year] (67 y.o. [name] Primary Care Provider: [name] Other Clinician: Referring Provider: Treating Provider/Extender: Matthew Folks in Treatment: 32 Constitutional Well-nourished and well-hydrated in no acute distress. Respiratory normal breathing without difficulty. Psychiatric this patient is able to make decisions and demonstrates good insight into disease process. Alert and Oriented x 3. pleasant and cooperative. Notes Upon inspection patient's wound bed actually showed signs of good granulation and epithelization at this  is not really good to alleviate his discomfort. We definitely can need to change his wrap out however in short order. 12-31-2022 upon evaluation today patient appears to be doing well currently in regard to his wound. He has been tolerating the dressing changes without complication. This actually looks to be doing much better today compared to where we were. Fortunately I do not see any evidence of active infection locally or systemically which is great news. 01-11-2023 upon evaluation today patient appears to be doing well currently in regard to his wound which is actually showing signs of significant improvement. Fortunately I do not see any signs of infection locally or systemically which is great news and in general I do believe that we are making good headway towards complete closure. 01-19-2023 upon evaluation today patient appears to be doing well currently hemoglobin to his wound. He has been tolerating the dressing changes without complication. Fortunately the wound is  not infected unfortunately it is a little bigger due to the fact that the wrap slid down. I do not see any signs of worsening or infection at this time. 01-28-2023 upon evaluation today patient appears to be doing well currently in regard to his wound. He has been tolerating the dressing changes without complication. Fortunately I do not see any signs of infection the compression wrap is doing a really good job and I think it worked pretty much just about healed. [name], [name] ([id number]) [id number] Page 3 of 9 02-04-2023 upon evaluation today patient appears to be doing excellent in regard to his wounds. In fact he is pretty much completely healed on the left although I think this may have just a pinpoint opening. Nonetheless I think it is 1 more week to toughen up. 02-16-2023 upon evaluation today patient appears to be doing well currently in regard to his wound. He has been tolerating the dressing changes without complication. Fortunately there does not appear to be any evidence of active infection locally or systemically which is good news. In fact the original wounds we been taking care of on the left and right legs are completely healed unfortunately his wrap slipped down the right leg and there are 2 small areas on the anterior portion of his leg which are open at this point. When I wrapped his right leg the left leg is completely closed however he is in agreement to his own compression sock. 02-26-2023 upon evaluation today patient appears to be doing well currently in regard to his left leg which is still close in the right leg is doing significantly better. Fortunately there does not appear to be any signs of active infection locally or systemically which is great news and in general I do believe that we will making headway towards complete closure. Electronic Signature(s) Signed: 02/26/2023 10:18:28 AM By: Allen Derry PA-C Entered By: Allen Derry on 02/26/2023  10:18:28 -------------------------------------------------------------------------------- Physical Exam Details Patient Name: Date of Service: [name], Matthew [name] 02/26/2023 9:45 A M Medical Record Number: [id number] Patient Account Number: [id number] Date of Birth/Sex: Treating RN: [birth year] (67 y.o. [name] Primary Care Provider: [name] Other Clinician: Referring Provider: Treating Provider/Extender: Matthew Folks in Treatment: 32 Constitutional Well-nourished and well-hydrated in no acute distress. Respiratory normal breathing without difficulty. Psychiatric this patient is able to make decisions and demonstrates good insight into disease process. Alert and Oriented x 3. pleasant and cooperative. Notes Upon inspection patient's wound bed actually showed signs of good granulation and epithelization at this  is not really good to alleviate his discomfort. We definitely can need to change his wrap out however in short order. 12-31-2022 upon evaluation today patient appears to be doing well currently in regard to his wound. He has been tolerating the dressing changes without complication. This actually looks to be doing much better today compared to where we were. Fortunately I do not see any evidence of active infection locally or systemically which is great news. 01-11-2023 upon evaluation today patient appears to be doing well currently in regard to his wound which is actually showing signs of significant improvement. Fortunately I do not see any signs of infection locally or systemically which is great news and in general I do believe that we are making good headway towards complete closure. 01-19-2023 upon evaluation today patient appears to be doing well currently hemoglobin to his wound. He has been tolerating the dressing changes without complication. Fortunately the wound is  not infected unfortunately it is a little bigger due to the fact that the wrap slid down. I do not see any signs of worsening or infection at this time. 01-28-2023 upon evaluation today patient appears to be doing well currently in regard to his wound. He has been tolerating the dressing changes without complication. Fortunately I do not see any signs of infection the compression wrap is doing a really good job and I think it worked pretty much just about healed. [name], [name] ([id number]) [id number] Page 3 of 9 02-04-2023 upon evaluation today patient appears to be doing excellent in regard to his wounds. In fact he is pretty much completely healed on the left although I think this may have just a pinpoint opening. Nonetheless I think it is 1 more week to toughen up. 02-16-2023 upon evaluation today patient appears to be doing well currently in regard to his wound. He has been tolerating the dressing changes without complication. Fortunately there does not appear to be any evidence of active infection locally or systemically which is good news. In fact the original wounds we been taking care of on the left and right legs are completely healed unfortunately his wrap slipped down the right leg and there are 2 small areas on the anterior portion of his leg which are open at this point. When I wrapped his right leg the left leg is completely closed however he is in agreement to his own compression sock. 02-26-2023 upon evaluation today patient appears to be doing well currently in regard to his left leg which is still close in the right leg is doing significantly better. Fortunately there does not appear to be any signs of active infection locally or systemically which is great news and in general I do believe that we will making headway towards complete closure. Electronic Signature(s) Signed: 02/26/2023 10:18:28 AM By: Allen Derry PA-C Entered By: Allen Derry on 02/26/2023  10:18:28 -------------------------------------------------------------------------------- Physical Exam Details Patient Name: Date of Service: [name], Matthew [name] 02/26/2023 9:45 A M Medical Record Number: [id number] Patient Account Number: [id number] Date of Birth/Sex: Treating RN: [birth year] (67 y.o. [name] Primary Care Provider: [name] Other Clinician: Referring Provider: Treating Provider/Extender: Matthew Folks in Treatment: 32 Constitutional Well-nourished and well-hydrated in no acute distress. Respiratory normal breathing without difficulty. Psychiatric this patient is able to make decisions and demonstrates good insight into disease process. Alert and Oriented x 3. pleasant and cooperative. Notes Upon inspection patient's wound bed actually showed signs of good granulation and epithelization at this  W09.811 Non-pressure chronic ulcer of other part of left lower leg with fat layer exposed2/06/2022 No Yes L97.812 Non-pressure chronic ulcer of other part of right lower leg with fat layer 07/16/2022 No Yes exposed I10 Essential (primary) hypertension 07/16/2022 No Yes I50.42 Chronic combined systolic (congestive) and diastolic (congestive) heart failure 07/16/2022 No Yes I49.9 Cardiac arrhythmia, unspecified 07/16/2022 No Yes Inactive Problems Resolved Problems Electronic Signature(s) Signed: 02/26/2023 9:44:32 AM By: Allen Derry PA-C Entered By: Allen Derry on 02/26/2023 09:44:32 -------------------------------------------------------------------------------- Progress Note Details Patient Name: Date of Service: [name], Matthew [name] 02/26/2023 9:45 A M Medical Record Number: [id number] Patient Account Number: [id number] Date of Birth/Sex:  Treating RN: [birth year] (67 y.o. [name] Primary Care Provider: [name] Other Clinician: Referring Provider: Treating Provider/Extender: Matthew Folks in Treatment: 32 Subjective Chief Complaint Information obtained from Patient Bilateral LE ulcers and lymphedema [name], [name] ([id number]) [phone].pdf Page 6 of 9 History of Present Illness (HPI) 07-16-2022 upon evaluation today patient appears to be doing poorly currently in regard to his his legs. The left leg is actually worse than the right. This is a patient that is previously been seen in the [place] office and at that point was doing really quite well with compression wrapping and often alginate are either Hydrofera Blue dressings. Fortunately he has gone since November 2020 until just current with out having any issue or need for wound care services which is great news. Unfortunately he is here today because he is having some issues at this point. Patient does have a history of several medical conditions which include diabetes mellitus type 2, lymphedema, hypertension, congestive heart failure, and cardiac arrhythmia though is not sure that this is actually atrial fibrillation based on what he has been told. He does have lymphedema pumps but tells me he has not used them since the summer when he had to move to a remodel of his building and subsequently never unpacked them. 07-23-2022 upon evaluation today patient appears to be doing well with regard to his legs. He is going require some sharp debridement today but overall seems to be making some progress. I am pleased in that regard he has much less drainage that he had did last week I think the compression wraps are definitely helping. 07-30-2022 upon evaluation today patient appears to be doing poorly in regard to his legs he actually has blue-green drainage which I think is consistent with Pseudomonas. I believe that we  need to address this as soon as possible and I discussed that with the patient today as well. Fortunately I do not see any signs of active infection locally nor systemically at this time which is great news. No fevers, chills, nausea, vomiting, or diarrhea. 08-13-2022 upon evaluation today patient appears to be doing well currently in regard to his leg ulcers which are actually looking much better. Fortunately there does not appear to be any signs of active infection at this time which is great news. No fevers, chills, nausea, vomiting, or diarrhea. 3/7; patient with predominantly a left medial lower extremity wound as well as several smaller areas and a small area on the right lateral. His Jodie Echevaria came in today. We are using silver alginate as the primary dressing 09-03-2022 upon evaluation today patient appears to be doing well currently in regard to his wounds. He has been tolerating the dressing changes without complication. Fortunately there does not appear to be any signs of infection there is needed however for sharp debridement. 09-10-2022  is not really good to alleviate his discomfort. We definitely can need to change his wrap out however in short order. 12-31-2022 upon evaluation today patient appears to be doing well currently in regard to his wound. He has been tolerating the dressing changes without complication. This actually looks to be doing much better today compared to where we were. Fortunately I do not see any evidence of active infection locally or systemically which is great news. 01-11-2023 upon evaluation today patient appears to be doing well currently in regard to his wound which is actually showing signs of significant improvement. Fortunately I do not see any signs of infection locally or systemically which is great news and in general I do believe that we are making good headway towards complete closure. 01-19-2023 upon evaluation today patient appears to be doing well currently hemoglobin to his wound. He has been tolerating the dressing changes without complication. Fortunately the wound is  not infected unfortunately it is a little bigger due to the fact that the wrap slid down. I do not see any signs of worsening or infection at this time. 01-28-2023 upon evaluation today patient appears to be doing well currently in regard to his wound. He has been tolerating the dressing changes without complication. Fortunately I do not see any signs of infection the compression wrap is doing a really good job and I think it worked pretty much just about healed. [name], [name] ([id number]) [id number] Page 3 of 9 02-04-2023 upon evaluation today patient appears to be doing excellent in regard to his wounds. In fact he is pretty much completely healed on the left although I think this may have just a pinpoint opening. Nonetheless I think it is 1 more week to toughen up. 02-16-2023 upon evaluation today patient appears to be doing well currently in regard to his wound. He has been tolerating the dressing changes without complication. Fortunately there does not appear to be any evidence of active infection locally or systemically which is good news. In fact the original wounds we been taking care of on the left and right legs are completely healed unfortunately his wrap slipped down the right leg and there are 2 small areas on the anterior portion of his leg which are open at this point. When I wrapped his right leg the left leg is completely closed however he is in agreement to his own compression sock. 02-26-2023 upon evaluation today patient appears to be doing well currently in regard to his left leg which is still close in the right leg is doing significantly better. Fortunately there does not appear to be any signs of active infection locally or systemically which is great news and in general I do believe that we will making headway towards complete closure. Electronic Signature(s) Signed: 02/26/2023 10:18:28 AM By: Allen Derry PA-C Entered By: Allen Derry on 02/26/2023  10:18:28 -------------------------------------------------------------------------------- Physical Exam Details Patient Name: Date of Service: [name], Matthew [name] 02/26/2023 9:45 A M Medical Record Number: [id number] Patient Account Number: [id number] Date of Birth/Sex: Treating RN: [birth year] (67 y.o. [name] Primary Care Provider: [name] Other Clinician: Referring Provider: Treating Provider/Extender: Matthew Folks in Treatment: 32 Constitutional Well-nourished and well-hydrated in no acute distress. Respiratory normal breathing without difficulty. Psychiatric this patient is able to make decisions and demonstrates good insight into disease process. Alert and Oriented x 3. pleasant and cooperative. Notes Upon inspection patient's wound bed actually showed signs of good granulation and epithelization at this  anything worsens or changes patient will contact our office for additional recommendations. Electronic Signature(s) Signed: 02/26/2023 10:19:36 AM By: Allen Derry PA-C Entered By: Allen Derry on 02/26/2023 10:19:35 -------------------------------------------------------------------------------- SuperBill  Details Patient Name: Date of Service: [name], Matthew [name] 02/26/2023 Medical Record Number: [id number] Patient Account Number: [id number] Date of Birth/Sex: Treating RN: June 21, 1955 (67 y.o. [name] Primary Care Provider: [name] Other Clinician: Referring Provider: Treating Provider/Extender: Matthew Folks in Treatment: 32 Diagnosis Coding ICD-10 Codes Code Description E11.622 Type 2 diabetes mellitus with other skin ulcer [name], [name] ([id number]) [phone].pdf Page 9 of 9 I89.0 Lymphedema, not elsewhere classified L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 L97.812 Non-pressure chronic ulcer of other part of right lower leg with fat layer exposed I10 Essential (primary) hypertension I50.42 Chronic combined systolic (congestive) and diastolic (congestive) heart failure I49.9 Cardiac arrhythmia, unspecified Facility Procedures : CPT4 Code: 70623762 Description: 99213 - WOUND CARE VISIT-LEV 3 EST PT Modifier: Quantity: 1 Physician Procedures : CPT4 Code Description Modifier [id number] 99213 - WC PHYS LEVEL 3 - EST PT ICD-10 Diagnosis Description E11.622 Type 2 diabetes mellitus with other skin ulcer I89.0 Lymphedema, not elsewhere classified L97.822 Non-pressure chronic ulcer of other part of  left lower leg with fat layer exposed L97.812 Non-pressure chronic ulcer of other part of right lower leg with fat layer exposed Quantity: 1 Electronic Signature(s) Signed: 02/26/2023 10:20:03 AM By: Allen Derry PA-C Entered By: Allen Derry on 02/26/2023 10:20:03  anything worsens or changes patient will contact our office for additional recommendations. Electronic Signature(s) Signed: 02/26/2023 10:19:36 AM By: Allen Derry PA-C Entered By: Allen Derry on 02/26/2023 10:19:35 -------------------------------------------------------------------------------- SuperBill  Details Patient Name: Date of Service: [name], Matthew [name] 02/26/2023 Medical Record Number: [id number] Patient Account Number: [id number] Date of Birth/Sex: Treating RN: June 21, 1955 (67 y.o. [name] Primary Care Provider: [name] Other Clinician: Referring Provider: Treating Provider/Extender: Matthew Folks in Treatment: 32 Diagnosis Coding ICD-10 Codes Code Description E11.622 Type 2 diabetes mellitus with other skin ulcer [name], [name] ([id number]) [phone].pdf Page 9 of 9 I89.0 Lymphedema, not elsewhere classified L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 L97.812 Non-pressure chronic ulcer of other part of right lower leg with fat layer exposed I10 Essential (primary) hypertension I50.42 Chronic combined systolic (congestive) and diastolic (congestive) heart failure I49.9 Cardiac arrhythmia, unspecified Facility Procedures : CPT4 Code: 70623762 Description: 99213 - WOUND CARE VISIT-LEV 3 EST PT Modifier: Quantity: 1 Physician Procedures : CPT4 Code Description Modifier [id number] 99213 - WC PHYS LEVEL 3 - EST PT ICD-10 Diagnosis Description E11.622 Type 2 diabetes mellitus with other skin ulcer I89.0 Lymphedema, not elsewhere classified L97.822 Non-pressure chronic ulcer of other part of  left lower leg with fat layer exposed L97.812 Non-pressure chronic ulcer of other part of right lower leg with fat layer exposed Quantity: 1 Electronic Signature(s) Signed: 02/26/2023 10:20:03 AM By: Allen Derry PA-C Entered By: Allen Derry on 02/26/2023 10:20:03  W09.811 Non-pressure chronic ulcer of other part of left lower leg with fat layer exposed2/06/2022 No Yes L97.812 Non-pressure chronic ulcer of other part of right lower leg with fat layer 07/16/2022 No Yes exposed I10 Essential (primary) hypertension 07/16/2022 No Yes I50.42 Chronic combined systolic (congestive) and diastolic (congestive) heart failure 07/16/2022 No Yes I49.9 Cardiac arrhythmia, unspecified 07/16/2022 No Yes Inactive Problems Resolved Problems Electronic Signature(s) Signed: 02/26/2023 9:44:32 AM By: Allen Derry PA-C Entered By: Allen Derry on 02/26/2023 09:44:32 -------------------------------------------------------------------------------- Progress Note Details Patient Name: Date of Service: [name], Matthew [name] 02/26/2023 9:45 A M Medical Record Number: [id number] Patient Account Number: [id number] Date of Birth/Sex:  Treating RN: [birth year] (67 y.o. [name] Primary Care Provider: [name] Other Clinician: Referring Provider: Treating Provider/Extender: Matthew Folks in Treatment: 32 Subjective Chief Complaint Information obtained from Patient Bilateral LE ulcers and lymphedema [name], [name] ([id number]) [phone].pdf Page 6 of 9 History of Present Illness (HPI) 07-16-2022 upon evaluation today patient appears to be doing poorly currently in regard to his his legs. The left leg is actually worse than the right. This is a patient that is previously been seen in the [place] office and at that point was doing really quite well with compression wrapping and often alginate are either Hydrofera Blue dressings. Fortunately he has gone since November 2020 until just current with out having any issue or need for wound care services which is great news. Unfortunately he is here today because he is having some issues at this point. Patient does have a history of several medical conditions which include diabetes mellitus type 2, lymphedema, hypertension, congestive heart failure, and cardiac arrhythmia though is not sure that this is actually atrial fibrillation based on what he has been told. He does have lymphedema pumps but tells me he has not used them since the summer when he had to move to a remodel of his building and subsequently never unpacked them. 07-23-2022 upon evaluation today patient appears to be doing well with regard to his legs. He is going require some sharp debridement today but overall seems to be making some progress. I am pleased in that regard he has much less drainage that he had did last week I think the compression wraps are definitely helping. 07-30-2022 upon evaluation today patient appears to be doing poorly in regard to his legs he actually has blue-green drainage which I think is consistent with Pseudomonas. I believe that we  need to address this as soon as possible and I discussed that with the patient today as well. Fortunately I do not see any signs of active infection locally nor systemically at this time which is great news. No fevers, chills, nausea, vomiting, or diarrhea. 08-13-2022 upon evaluation today patient appears to be doing well currently in regard to his leg ulcers which are actually looking much better. Fortunately there does not appear to be any signs of active infection at this time which is great news. No fevers, chills, nausea, vomiting, or diarrhea. 3/7; patient with predominantly a left medial lower extremity wound as well as several smaller areas and a small area on the right lateral. His Jodie Echevaria came in today. We are using silver alginate as the primary dressing 09-03-2022 upon evaluation today patient appears to be doing well currently in regard to his wounds. He has been tolerating the dressing changes without complication. Fortunately there does not appear to be any signs of infection there is needed however for sharp debridement. 09-10-2022  W09.811 Non-pressure chronic ulcer of other part of left lower leg with fat layer exposed2/06/2022 No Yes L97.812 Non-pressure chronic ulcer of other part of right lower leg with fat layer 07/16/2022 No Yes exposed I10 Essential (primary) hypertension 07/16/2022 No Yes I50.42 Chronic combined systolic (congestive) and diastolic (congestive) heart failure 07/16/2022 No Yes I49.9 Cardiac arrhythmia, unspecified 07/16/2022 No Yes Inactive Problems Resolved Problems Electronic Signature(s) Signed: 02/26/2023 9:44:32 AM By: Allen Derry PA-C Entered By: Allen Derry on 02/26/2023 09:44:32 -------------------------------------------------------------------------------- Progress Note Details Patient Name: Date of Service: [name], Matthew [name] 02/26/2023 9:45 A M Medical Record Number: [id number] Patient Account Number: [id number] Date of Birth/Sex:  Treating RN: [birth year] (67 y.o. [name] Primary Care Provider: [name] Other Clinician: Referring Provider: Treating Provider/Extender: Matthew Folks in Treatment: 32 Subjective Chief Complaint Information obtained from Patient Bilateral LE ulcers and lymphedema [name], [name] ([id number]) [phone].pdf Page 6 of 9 History of Present Illness (HPI) 07-16-2022 upon evaluation today patient appears to be doing poorly currently in regard to his his legs. The left leg is actually worse than the right. This is a patient that is previously been seen in the [place] office and at that point was doing really quite well with compression wrapping and often alginate are either Hydrofera Blue dressings. Fortunately he has gone since November 2020 until just current with out having any issue or need for wound care services which is great news. Unfortunately he is here today because he is having some issues at this point. Patient does have a history of several medical conditions which include diabetes mellitus type 2, lymphedema, hypertension, congestive heart failure, and cardiac arrhythmia though is not sure that this is actually atrial fibrillation based on what he has been told. He does have lymphedema pumps but tells me he has not used them since the summer when he had to move to a remodel of his building and subsequently never unpacked them. 07-23-2022 upon evaluation today patient appears to be doing well with regard to his legs. He is going require some sharp debridement today but overall seems to be making some progress. I am pleased in that regard he has much less drainage that he had did last week I think the compression wraps are definitely helping. 07-30-2022 upon evaluation today patient appears to be doing poorly in regard to his legs he actually has blue-green drainage which I think is consistent with Pseudomonas. I believe that we  need to address this as soon as possible and I discussed that with the patient today as well. Fortunately I do not see any signs of active infection locally nor systemically at this time which is great news. No fevers, chills, nausea, vomiting, or diarrhea. 08-13-2022 upon evaluation today patient appears to be doing well currently in regard to his leg ulcers which are actually looking much better. Fortunately there does not appear to be any signs of active infection at this time which is great news. No fevers, chills, nausea, vomiting, or diarrhea. 3/7; patient with predominantly a left medial lower extremity wound as well as several smaller areas and a small area on the right lateral. His Jodie Echevaria came in today. We are using silver alginate as the primary dressing 09-03-2022 upon evaluation today patient appears to be doing well currently in regard to his wounds. He has been tolerating the dressing changes without complication. Fortunately there does not appear to be any signs of infection there is needed however for sharp debridement. 09-10-2022  W09.811 Non-pressure chronic ulcer of other part of left lower leg with fat layer exposed2/06/2022 No Yes L97.812 Non-pressure chronic ulcer of other part of right lower leg with fat layer 07/16/2022 No Yes exposed I10 Essential (primary) hypertension 07/16/2022 No Yes I50.42 Chronic combined systolic (congestive) and diastolic (congestive) heart failure 07/16/2022 No Yes I49.9 Cardiac arrhythmia, unspecified 07/16/2022 No Yes Inactive Problems Resolved Problems Electronic Signature(s) Signed: 02/26/2023 9:44:32 AM By: Allen Derry PA-C Entered By: Allen Derry on 02/26/2023 09:44:32 -------------------------------------------------------------------------------- Progress Note Details Patient Name: Date of Service: [name], Matthew [name] 02/26/2023 9:45 A M Medical Record Number: [id number] Patient Account Number: [id number] Date of Birth/Sex:  Treating RN: [birth year] (67 y.o. [name] Primary Care Provider: [name] Other Clinician: Referring Provider: Treating Provider/Extender: Matthew Folks in Treatment: 32 Subjective Chief Complaint Information obtained from Patient Bilateral LE ulcers and lymphedema [name], [name] ([id number]) [phone].pdf Page 6 of 9 History of Present Illness (HPI) 07-16-2022 upon evaluation today patient appears to be doing poorly currently in regard to his his legs. The left leg is actually worse than the right. This is a patient that is previously been seen in the [place] office and at that point was doing really quite well with compression wrapping and often alginate are either Hydrofera Blue dressings. Fortunately he has gone since November 2020 until just current with out having any issue or need for wound care services which is great news. Unfortunately he is here today because he is having some issues at this point. Patient does have a history of several medical conditions which include diabetes mellitus type 2, lymphedema, hypertension, congestive heart failure, and cardiac arrhythmia though is not sure that this is actually atrial fibrillation based on what he has been told. He does have lymphedema pumps but tells me he has not used them since the summer when he had to move to a remodel of his building and subsequently never unpacked them. 07-23-2022 upon evaluation today patient appears to be doing well with regard to his legs. He is going require some sharp debridement today but overall seems to be making some progress. I am pleased in that regard he has much less drainage that he had did last week I think the compression wraps are definitely helping. 07-30-2022 upon evaluation today patient appears to be doing poorly in regard to his legs he actually has blue-green drainage which I think is consistent with Pseudomonas. I believe that we  need to address this as soon as possible and I discussed that with the patient today as well. Fortunately I do not see any signs of active infection locally nor systemically at this time which is great news. No fevers, chills, nausea, vomiting, or diarrhea. 08-13-2022 upon evaluation today patient appears to be doing well currently in regard to his leg ulcers which are actually looking much better. Fortunately there does not appear to be any signs of active infection at this time which is great news. No fevers, chills, nausea, vomiting, or diarrhea. 3/7; patient with predominantly a left medial lower extremity wound as well as several smaller areas and a small area on the right lateral. His Jodie Echevaria came in today. We are using silver alginate as the primary dressing 09-03-2022 upon evaluation today patient appears to be doing well currently in regard to his wounds. He has been tolerating the dressing changes without complication. Fortunately there does not appear to be any signs of infection there is needed however for sharp debridement. 09-10-2022  anything worsens or changes patient will contact our office for additional recommendations. Electronic Signature(s) Signed: 02/26/2023 10:19:36 AM By: Allen Derry PA-C Entered By: Allen Derry on 02/26/2023 10:19:35 -------------------------------------------------------------------------------- SuperBill  Details Patient Name: Date of Service: [name], Matthew [name] 02/26/2023 Medical Record Number: [id number] Patient Account Number: [id number] Date of Birth/Sex: Treating RN: June 21, 1955 (67 y.o. [name] Primary Care Provider: [name] Other Clinician: Referring Provider: Treating Provider/Extender: Matthew Folks in Treatment: 32 Diagnosis Coding ICD-10 Codes Code Description E11.622 Type 2 diabetes mellitus with other skin ulcer [name], [name] ([id number]) [phone].pdf Page 9 of 9 I89.0 Lymphedema, not elsewhere classified L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 L97.812 Non-pressure chronic ulcer of other part of right lower leg with fat layer exposed I10 Essential (primary) hypertension I50.42 Chronic combined systolic (congestive) and diastolic (congestive) heart failure I49.9 Cardiac arrhythmia, unspecified Facility Procedures : CPT4 Code: [id number] Description: 99213 - WOUND CARE VISIT-LEV 3 EST PT Modifier: Quantity: 1 Physician Procedures : CPT4 Code Description Modifier [id number] 99213 - WC PHYS LEVEL 3 - EST PT ICD-10 Diagnosis Description E11.622 Type 2 diabetes mellitus with other skin ulcer I89.0 Lymphedema, not elsewhere classified L97.822 Non-pressure chronic ulcer of other part of  left lower leg with fat layer exposed L97.812 Non-pressure chronic ulcer of other part of right lower leg with fat layer exposed Quantity: 1 Electronic Signature(s) Signed: 02/26/2023 10:20:03 AM By: Allen Derry PA-C Entered By: Allen Derry on 02/26/2023 10:20:03

## 2023-02-26 NOTE — Progress Notes (Addendum)
ANTRONE, ZEH ([id number]) [id number] Page 1 of 9 Visit Report for 02/26/2023 Arrival Information Details Patient Name: Date of Service: [name] 02/26/2023 9:45 A M Medical Record Number: [id number] Patient Account Number: [id number] Date of Birth/Sex: Treating RN: 11/18/1955 (67 y.o. [name] Primary Care Kewanna Kasprzak: [name] Other Clinician: Referring Jonhatan Hearty: Treating Christal Lagerstrom/Extender: Matthew Folks in Treatment: 32 Visit Information History Since Last Visit Added or deleted any medications: No Patient Arrived: Ambulatory Any new allergies or adverse reactions: No Arrival Time: 09:43 Has Dressing in Place as Prescribed: Yes Accompanied By: self Has Compression in Place as Prescribed: Yes Transfer Assistance: None Pain Present Now: No Patient Identification Verified: Yes Secondary Verification Process Completed: Yes Patient Requires Transmission-Based Precautions: No Patient Has Alerts: No Electronic Signature(s) Signed: 02/26/2023 12:41:22 PM By: Midge Aver MSN RN CNS WTA Entered By: Midge Aver on 02/26/2023 09:45:16 -------------------------------------------------------------------------------- Clinic Level of Care Assessment Details Patient Name: Date of Service: [name] 02/26/2023 9:45 A M Medical Record Number: [id number] Patient Account Number: [id number] Date of Birth/Sex: Treating RN: August 19, 1955 (67 y.o. [name] Primary Care Glenora Morocho: [name] Other Clinician: Referring Alianys Chacko: Treating Quintana Canelo/Extender: Matthew Folks in Treatment: 32 Clinic Level of Care Assessment Items TOOL 4 Quantity Score X- 1 0 Use when only an EandM is performed on FOLLOW-UP visit ASSESSMENTS - Nursing Assessment / Reassessment X- 1 10 Reassessment of Co-morbidities (includes updates in patient status) X- 1 5 Reassessment of Adherence to Treatment Plan ASSESSMENTS -  Wound and Skin A ssessment / Reassessment X - Simple Wound Assessment / Reassessment - one wound 1 5 []  - 0 Complex Wound Assessment / Reassessment - multiple wounds DATAVIOUS, KYGER ([id number]) [id number] Page 2 of 9 []  - 0 Dermatologic / Skin Assessment (not related to wound area) ASSESSMENTS - Focused Assessment []  - 0 Circumferential Edema Measurements - multi extremities []  - 0 Nutritional Assessment / Counseling / Intervention []  - 0 Lower Extremity Assessment (monofilament, tuning fork, pulses) []  - 0 Peripheral Arterial Disease Assessment (using hand held doppler) ASSESSMENTS - Ostomy and/or Continence Assessment and Care []  - 0 Incontinence Assessment and Management []  - 0 Ostomy Care Assessment and Management (repouching, etc.) PROCESS - Coordination of Care X - Simple Patient / Family Education for ongoing care 1 15 []  - 0 Complex (extensive) Patient / Family Education for ongoing care X- 1 10 Staff obtains Chiropractor, Records, T Results / Process Orders est []  - 0 Staff telephones HHA, Nursing Homes / Clarify orders / etc []  - 0 Routine Transfer to another Facility (non-emergent condition) []  - 0 Routine Hospital Admission (non-emergent condition) []  - 0 New Admissions / Manufacturing engineer / Ordering NPWT Apligraf, etc. , []  - 0 Emergency Hospital Admission (emergent condition) X- 1 10 Simple Discharge Coordination []  - 0 Complex (extensive) Discharge Coordination PROCESS - Special Needs []  - 0 Pediatric / Minor Patient Management []  - 0 Isolation Patient Management []  - 0 Hearing / Language / Visual special needs []  - 0 Assessment of Community assistance (transportation, D/C planning, etc.) []  - 0 Additional assistance / Altered mentation []  - 0 Support Surface(s) Assessment (bed, cushion, seat, etc.) INTERVENTIONS - Wound Cleansing / Measurement X - Simple Wound Cleansing - one wound 1 5 []  - 0 Complex Wound  Cleansing - multiple wounds X- 1 5 Wound Imaging (photographs - any number of wounds) []  - 0 Wound Tracing (instead of photographs) X- 1 5 Simple Wound  PM By: Midge Aver MSN RN CNS Margarita Rana [id number].pdf Page 5 of 9 Signed: 02/26/2023 12:41:22 Entered By: Midge Aver on 02/26/2023 09:55:51 -------------------------------------------------------------------------------- Multi Wound Chart Details Patient Name: Date of Service: [name] 02/26/2023 9:45 A M Medical Record Number: [id number] Patient Account Number: [id number] Date of Birth/Sex: Treating RN: [birth year] (67 y.o. [name] Primary Care Senetra Dillin: [name] Other Clinician: Referring Shanyn Preisler: Treating Jarom Govan/Extender: Matthew Folks in Treatment: 32 Vital Signs Height(in): 73 Pulse(bpm): 78 Weight(lbs): 358 Blood Pressure(mmHg): 114/61 Body Mass Index(BMI): 47.2 Temperature(F): 97.9 Respiratory Rate(breaths/min): 18 [3:Photos:] [N/A:N/A] Right Lower Leg N/A N/A Wound Location: Gradually Appeared N/A N/A Wounding Event: Lymphedema N/A N/A Primary Etiology: Arrhythmia, Congestive Heart Failure, N/A N/A Comorbid History: Hypertension, Type II Diabetes 02/04/2023 N/A N/A Date Acquired: 3 N/A N/A Weeks of Treatment: Open N/A N/A Wound Status: No N/A N/A Wound Recurrence: 1.7x0.5x0.1 N/A N/A Measurements L x W x D (cm) 0.668 N/A N/A A (cm) : rea 0.067 N/A N/A Volume (cm) : 45.50% N/A N/A % Reduction in A rea: 72.70% N/A N/A % Reduction in  Volume: Partial Thickness N/A N/A Classification: Medium N/A N/A Exudate A mount: Serosanguineous N/A N/A Exudate Type: red, brown N/A N/A Exudate Color: Large (67-100%) N/A N/A Granulation A mount: Small (1-33%) N/A N/A Necrotic A mount: Fat Layer (Subcutaneous Tissue): Yes N/A N/A Exposed Structures: Fascia: No Tendon: No Muscle: No Joint: No Bone: No None N/A N/A Epithelialization: Treatment Notes Electronic Signature(s) Signed: 02/26/2023 12:41:22 PM By: Midge Aver MSN RN CNS WTA Entered By: Midge Aver on 02/26/2023 09:56:59 [name] ([id number]) [id number] Page 6 of 9 -------------------------------------------------------------------------------- Multi-Disciplinary Care Plan Details Patient Name: Date of Service: [name] 02/26/2023 9:45 A M Medical Record Number: [id number] Patient Account Number: [id number] Date of Birth/Sex: Treating RN: [birth year] (67 y.o. 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 (use after diagnosis is confirmed) Goals: Patient will maintain optimal edema control Date Initiated: 10/15/2022 Target Resolution Date: 03/12/2023 Goal Status: Active Patient/caregiver will verbalize understanding of disease process and disease management Date Initiated: 10/15/2022 Date Inactivated: 11/12/2022 Target Resolution Date: 10/15/2022 Goal Status: Met Verify adequate tissue perfusion prior to therapeutic compression application Date Initiated: 10/15/2022 Date Inactivated: 11/12/2022 Target Resolution Date: 10/15/2022 Goal Status: Met Interventions: Assess peripheral edema status every visit. Compression as ordered Provide education on venous insufficiency Treatment Activities: Therapeutic compression applied  : 10/15/2022 Notes: Electronic Signature(s) Signed: 02/26/2023 12:41:22 PM By: Midge Aver MSN RN CNS WTA Entered By: Midge Aver on 02/26/2023 10:19:27 -------------------------------------------------------------------------------- Pain Assessment Details Patient Name: Date of Service: [name] 02/26/2023 9:45 A M Medical Record Number: [id number] Patient Account Number: [id number] Date of Birth/Sex: Treating RN: November 16, 1955 (67 y.o. Mylen, Hammock, Beatrix Shipper ([id number]) [id number] Page 7 of 9 Primary Care Peder Allums: [name] Other Clinician: Referring Chardai Gangemi: Treating Kaizen Ibsen/Extender: Matthew Folks in Treatment: 32 Active Problems Location of Pain Severity and Description of Pain Patient Has Paino No Site Locations Pain Management and Medication Current Pain Management: Electronic Signature(s) Signed: 02/26/2023 12:41:22 PM By: Midge Aver MSN RN CNS WTA Entered By: Midge Aver on 02/26/2023 09:48:43 -------------------------------------------------------------------------------- Patient/Caregiver Education Details Patient Name: Date of Service: [name] 9/13/2024andnbsp9:45 A M Medical Record Number: [id number] Patient Account Number: [id number] Date of Birth/Gender: Treating RN: 1955/11/06 (66  ANTRONE, ZEH ([id number]) [id number] Page 1 of 9 Visit Report for 02/26/2023 Arrival Information Details Patient Name: Date of Service: [name] 02/26/2023 9:45 A M Medical Record Number: [id number] Patient Account Number: [id number] Date of Birth/Sex: Treating RN: 11/18/1955 (67 y.o. [name] Primary Care Kewanna Kasprzak: [name] Other Clinician: Referring Jonhatan Hearty: Treating Christal Lagerstrom/Extender: Matthew Folks in Treatment: 32 Visit Information History Since Last Visit Added or deleted any medications: No Patient Arrived: Ambulatory Any new allergies or adverse reactions: No Arrival Time: 09:43 Has Dressing in Place as Prescribed: Yes Accompanied By: self Has Compression in Place as Prescribed: Yes Transfer Assistance: None Pain Present Now: No Patient Identification Verified: Yes Secondary Verification Process Completed: Yes Patient Requires Transmission-Based Precautions: No Patient Has Alerts: No Electronic Signature(s) Signed: 02/26/2023 12:41:22 PM By: Midge Aver MSN RN CNS WTA Entered By: Midge Aver on 02/26/2023 09:45:16 -------------------------------------------------------------------------------- Clinic Level of Care Assessment Details Patient Name: Date of Service: [name] 02/26/2023 9:45 A M Medical Record Number: [id number] Patient Account Number: [id number] Date of Birth/Sex: Treating RN: August 19, 1955 (67 y.o. [name] Primary Care Glenora Morocho: [name] Other Clinician: Referring Alianys Chacko: Treating Quintana Canelo/Extender: Matthew Folks in Treatment: 32 Clinic Level of Care Assessment Items TOOL 4 Quantity Score X- 1 0 Use when only an EandM is performed on FOLLOW-UP visit ASSESSMENTS - Nursing Assessment / Reassessment X- 1 10 Reassessment of Co-morbidities (includes updates in patient status) X- 1 5 Reassessment of Adherence to Treatment Plan ASSESSMENTS -  Wound and Skin A ssessment / Reassessment X - Simple Wound Assessment / Reassessment - one wound 1 5 []  - 0 Complex Wound Assessment / Reassessment - multiple wounds DATAVIOUS, KYGER ([id number]) [id number] Page 2 of 9 []  - 0 Dermatologic / Skin Assessment (not related to wound area) ASSESSMENTS - Focused Assessment []  - 0 Circumferential Edema Measurements - multi extremities []  - 0 Nutritional Assessment / Counseling / Intervention []  - 0 Lower Extremity Assessment (monofilament, tuning fork, pulses) []  - 0 Peripheral Arterial Disease Assessment (using hand held doppler) ASSESSMENTS - Ostomy and/or Continence Assessment and Care []  - 0 Incontinence Assessment and Management []  - 0 Ostomy Care Assessment and Management (repouching, etc.) PROCESS - Coordination of Care X - Simple Patient / Family Education for ongoing care 1 15 []  - 0 Complex (extensive) Patient / Family Education for ongoing care X- 1 10 Staff obtains Chiropractor, Records, T Results / Process Orders est []  - 0 Staff telephones HHA, Nursing Homes / Clarify orders / etc []  - 0 Routine Transfer to another Facility (non-emergent condition) []  - 0 Routine Hospital Admission (non-emergent condition) []  - 0 New Admissions / Manufacturing engineer / Ordering NPWT Apligraf, etc. , []  - 0 Emergency Hospital Admission (emergent condition) X- 1 10 Simple Discharge Coordination []  - 0 Complex (extensive) Discharge Coordination PROCESS - Special Needs []  - 0 Pediatric / Minor Patient Management []  - 0 Isolation Patient Management []  - 0 Hearing / Language / Visual special needs []  - 0 Assessment of Community assistance (transportation, D/C planning, etc.) []  - 0 Additional assistance / Altered mentation []  - 0 Support Surface(s) Assessment (bed, cushion, seat, etc.) INTERVENTIONS - Wound Cleansing / Measurement X - Simple Wound Cleansing - one wound 1 5 []  - 0 Complex Wound  Cleansing - multiple wounds X- 1 5 Wound Imaging (photographs - any number of wounds) []  - 0 Wound Tracing (instead of photographs) X- 1 5 Simple Wound  ANTRONE, ZEH ([id number]) [id number] Page 1 of 9 Visit Report for 02/26/2023 Arrival Information Details Patient Name: Date of Service: [name] 02/26/2023 9:45 A M Medical Record Number: [id number] Patient Account Number: [id number] Date of Birth/Sex: Treating RN: 11/18/1955 (67 y.o. [name] Primary Care Kewanna Kasprzak: [name] Other Clinician: Referring Jonhatan Hearty: Treating Christal Lagerstrom/Extender: Matthew Folks in Treatment: 32 Visit Information History Since Last Visit Added or deleted any medications: No Patient Arrived: Ambulatory Any new allergies or adverse reactions: No Arrival Time: 09:43 Has Dressing in Place as Prescribed: Yes Accompanied By: self Has Compression in Place as Prescribed: Yes Transfer Assistance: None Pain Present Now: No Patient Identification Verified: Yes Secondary Verification Process Completed: Yes Patient Requires Transmission-Based Precautions: No Patient Has Alerts: No Electronic Signature(s) Signed: 02/26/2023 12:41:22 PM By: Midge Aver MSN RN CNS WTA Entered By: Midge Aver on 02/26/2023 09:45:16 -------------------------------------------------------------------------------- Clinic Level of Care Assessment Details Patient Name: Date of Service: [name] 02/26/2023 9:45 A M Medical Record Number: [id number] Patient Account Number: [id number] Date of Birth/Sex: Treating RN: August 19, 1955 (67 y.o. [name] Primary Care Glenora Morocho: [name] Other Clinician: Referring Alianys Chacko: Treating Quintana Canelo/Extender: Matthew Folks in Treatment: 32 Clinic Level of Care Assessment Items TOOL 4 Quantity Score X- 1 0 Use when only an EandM is performed on FOLLOW-UP visit ASSESSMENTS - Nursing Assessment / Reassessment X- 1 10 Reassessment of Co-morbidities (includes updates in patient status) X- 1 5 Reassessment of Adherence to Treatment Plan ASSESSMENTS -  Wound and Skin A ssessment / Reassessment X - Simple Wound Assessment / Reassessment - one wound 1 5 []  - 0 Complex Wound Assessment / Reassessment - multiple wounds DATAVIOUS, KYGER ([id number]) [id number] Page 2 of 9 []  - 0 Dermatologic / Skin Assessment (not related to wound area) ASSESSMENTS - Focused Assessment []  - 0 Circumferential Edema Measurements - multi extremities []  - 0 Nutritional Assessment / Counseling / Intervention []  - 0 Lower Extremity Assessment (monofilament, tuning fork, pulses) []  - 0 Peripheral Arterial Disease Assessment (using hand held doppler) ASSESSMENTS - Ostomy and/or Continence Assessment and Care []  - 0 Incontinence Assessment and Management []  - 0 Ostomy Care Assessment and Management (repouching, etc.) PROCESS - Coordination of Care X - Simple Patient / Family Education for ongoing care 1 15 []  - 0 Complex (extensive) Patient / Family Education for ongoing care X- 1 10 Staff obtains Chiropractor, Records, T Results / Process Orders est []  - 0 Staff telephones HHA, Nursing Homes / Clarify orders / etc []  - 0 Routine Transfer to another Facility (non-emergent condition) []  - 0 Routine Hospital Admission (non-emergent condition) []  - 0 New Admissions / Manufacturing engineer / Ordering NPWT Apligraf, etc. , []  - 0 Emergency Hospital Admission (emergent condition) X- 1 10 Simple Discharge Coordination []  - 0 Complex (extensive) Discharge Coordination PROCESS - Special Needs []  - 0 Pediatric / Minor Patient Management []  - 0 Isolation Patient Management []  - 0 Hearing / Language / Visual special needs []  - 0 Assessment of Community assistance (transportation, D/C planning, etc.) []  - 0 Additional assistance / Altered mentation []  - 0 Support Surface(s) Assessment (bed, cushion, seat, etc.) INTERVENTIONS - Wound Cleansing / Measurement X - Simple Wound Cleansing - one wound 1 5 []  - 0 Complex Wound  Cleansing - multiple wounds X- 1 5 Wound Imaging (photographs - any number of wounds) []  - 0 Wound Tracing (instead of photographs) X- 1 5 Simple Wound

## 2023-03-01 ENCOUNTER — Encounter: Payer: Self-pay | Admitting: Internal Medicine

## 2023-03-05 ENCOUNTER — Encounter: Payer: PPO | Admitting: Physician Assistant

## 2023-03-05 DIAGNOSIS — I890 Lymphedema, not elsewhere classified: Secondary | ICD-10-CM | POA: Diagnosis not present

## 2023-03-05 DIAGNOSIS — L97812 Non-pressure chronic ulcer of other part of right lower leg with fat layer exposed: Secondary | ICD-10-CM | POA: Diagnosis not present

## 2023-03-05 DIAGNOSIS — E11622 Type 2 diabetes mellitus with other skin ulcer: Secondary | ICD-10-CM | POA: Diagnosis not present

## 2023-03-05 DIAGNOSIS — L97822 Non-pressure chronic ulcer of other part of left lower leg with fat layer exposed: Secondary | ICD-10-CM | POA: Diagnosis not present

## 2023-03-05 NOTE — Progress Notes (Addendum)
is not really good to alleviate his discomfort. We definitely can need to change his wrap out however in short order. 12-31-2022 upon evaluation today patient appears to be doing well currently in regard to his wound. He has been tolerating the dressing changes without complication. This actually looks to be doing much better today compared to where we were. Fortunately I do not see any evidence of active infection locally or systemically which is great news. 01-11-2023 upon evaluation today patient appears to be doing well currently in regard to his wound which is actually showing signs of significant improvement. Fortunately I do not see any signs of infection locally or systemically which is great news and in general I do believe that we are making good headway towards complete closure. 01-19-2023 upon evaluation today patient appears to be doing well currently hemoglobin to his wound. He has been tolerating the dressing changes without complication. Fortunately the wound is  not infected unfortunately it is a little bigger due to the fact that the wrap slid down. I do not see any signs of worsening or infection at this time. 01-28-2023 upon evaluation today patient appears to be doing well currently in regard to his wound. He has been tolerating the dressing changes without complication. Fortunately I do not see any signs of infection the compression wrap is doing a really good job and I think it worked pretty much just about healed. CAYD, REVEL ([id number]) [id number] Page 3 of 9 02-04-2023 upon evaluation today patient appears to be doing excellent in regard to his wounds. In fact he is pretty much completely healed on the left although I think this may have just a pinpoint opening. Nonetheless I think it is 1 more week to toughen up. 02-16-2023 upon evaluation today patient appears to be doing well currently in regard to his wound. He has been tolerating the dressing changes without complication. Fortunately there does not appear to be any evidence of active infection locally or systemically which is good news. In fact the original wounds we been taking care of on the left and right legs are completely healed unfortunately his wrap slipped down the right leg and there are 2 small areas on the anterior portion of his leg which are open at this point. When I wrapped his right leg the left leg is completely closed however he is in agreement to his own compression sock. 02-26-2023 upon evaluation today patient appears to be doing well currently in regard to his left leg which is still close in the right leg is doing significantly better. Fortunately there does not appear to be any signs of active infection locally or systemically which is great news and in general I do believe that we will making headway towards complete closure. 03-05-2023 upon evaluation today patient actually appears to be doing excellent in regard to his wounds one of the  areas healed the other is significantly improved. I am actually very pleased with where we stand today and I think that he is doing well with his compression stockings which is excellent as well. Electronic Signature(s) Signed: 03/05/2023 8:25:13 AM By: Allen Derry PA-C Entered By: Allen Derry on 03/05/2023 05:25:13 -------------------------------------------------------------------------------- Physical Exam Details Patient Name: Date of Service: [name], Matthew [name] 03/05/2023 8:15 A M Medical Record Number: [id number] Patient Account Number: [id number] Date of Birth/Sex: Treating RN: April 05, 1956 (67 y.o. [name] Primary Care Provider: [name] Other Clinician: Referring Provider: Treating Provider/Extender: Matthew Folks in Treatment:  is not really good to alleviate his discomfort. We definitely can need to change his wrap out however in short order. 12-31-2022 upon evaluation today patient appears to be doing well currently in regard to his wound. He has been tolerating the dressing changes without complication. This actually looks to be doing much better today compared to where we were. Fortunately I do not see any evidence of active infection locally or systemically which is great news. 01-11-2023 upon evaluation today patient appears to be doing well currently in regard to his wound which is actually showing signs of significant improvement. Fortunately I do not see any signs of infection locally or systemically which is great news and in general I do believe that we are making good headway towards complete closure. 01-19-2023 upon evaluation today patient appears to be doing well currently hemoglobin to his wound. He has been tolerating the dressing changes without complication. Fortunately the wound is  not infected unfortunately it is a little bigger due to the fact that the wrap slid down. I do not see any signs of worsening or infection at this time. 01-28-2023 upon evaluation today patient appears to be doing well currently in regard to his wound. He has been tolerating the dressing changes without complication. Fortunately I do not see any signs of infection the compression wrap is doing a really good job and I think it worked pretty much just about healed. CAYD, REVEL ([id number]) [id number] Page 3 of 9 02-04-2023 upon evaluation today patient appears to be doing excellent in regard to his wounds. In fact he is pretty much completely healed on the left although I think this may have just a pinpoint opening. Nonetheless I think it is 1 more week to toughen up. 02-16-2023 upon evaluation today patient appears to be doing well currently in regard to his wound. He has been tolerating the dressing changes without complication. Fortunately there does not appear to be any evidence of active infection locally or systemically which is good news. In fact the original wounds we been taking care of on the left and right legs are completely healed unfortunately his wrap slipped down the right leg and there are 2 small areas on the anterior portion of his leg which are open at this point. When I wrapped his right leg the left leg is completely closed however he is in agreement to his own compression sock. 02-26-2023 upon evaluation today patient appears to be doing well currently in regard to his left leg which is still close in the right leg is doing significantly better. Fortunately there does not appear to be any signs of active infection locally or systemically which is great news and in general I do believe that we will making headway towards complete closure. 03-05-2023 upon evaluation today patient actually appears to be doing excellent in regard to his wounds one of the  areas healed the other is significantly improved. I am actually very pleased with where we stand today and I think that he is doing well with his compression stockings which is excellent as well. Electronic Signature(s) Signed: 03/05/2023 8:25:13 AM By: Allen Derry PA-C Entered By: Allen Derry on 03/05/2023 05:25:13 -------------------------------------------------------------------------------- Physical Exam Details Patient Name: Date of Service: [name], Matthew [name] 03/05/2023 8:15 A M Medical Record Number: [id number] Patient Account Number: [id number] Date of Birth/Sex: Treating RN: April 05, 1956 (67 y.o. [name] Primary Care Provider: [name] Other Clinician: Referring Provider: Treating Provider/Extender: Matthew Folks in Treatment:  of other part of right lower leg with fat layer 07/16/2022 No Yes exposed I10 Essential (primary) hypertension 07/16/2022 No Yes I50.42 Chronic combined systolic (congestive) and diastolic (congestive) heart failure 07/16/2022 No Yes I49.9 Cardiac arrhythmia, unspecified 07/16/2022 No Yes Inactive Problems Resolved Problems Electronic Signature(s) Signed: 03/05/2023 8:18:04 AM By: Allen Derry PA-C Entered By: Allen Derry on 03/05/2023 05:18:04 -------------------------------------------------------------------------------- Progress Note Details Patient Name: Date of Service: [name], Matthew [name] 03/05/2023 8:15 A M Medical Record Number: [id number] Patient Account Number: [id number] Date of Birth/Sex: Treating RN: 1955-09-11 (67 y.o. [name] Primary Care  Provider: [name] Other Clinician: Referring Provider: Treating Provider/Extender: Matthew Folks in Treatment: 33 Subjective Chief Complaint Information obtained from Patient Bilateral LE ulcers and lymphedema DAVAUN, NAJERA ([id number]) [id number] Page 6 of 9 History of Present Illness (HPI) 07-16-2022 upon evaluation today patient appears to be doing poorly currently in regard to his his legs. The left leg is actually worse than the right. This is a patient that is previously been seen in the [place] office and at that point was doing really quite well with compression wrapping and often alginate are either Hydrofera Blue dressings. Fortunately he has gone since November 2020 until just current with out having any issue or need for wound care services which is great news. Unfortunately he is here today because he is having some issues at this point. Patient does have a history of several medical conditions which include diabetes mellitus type 2, lymphedema, hypertension, congestive heart failure, and cardiac arrhythmia though is not sure that this is actually atrial fibrillation based on what he has been told. He does have lymphedema pumps but tells me he has not used them since the summer when he had to move to a remodel of his building and subsequently never unpacked them. 07-23-2022 upon evaluation today patient appears to be doing well with regard to his legs. He is going require some sharp debridement today but overall seems to be making some progress. I am pleased in that regard he has much less drainage that he had did last week I think the compression wraps are definitely helping. 07-30-2022 upon evaluation today patient appears to be doing poorly in regard to his legs he actually has blue-green drainage which I think is consistent with Pseudomonas. I believe that we need to address this as soon as possible and I discussed that  with the patient today as well. Fortunately I do not see any signs of active infection locally nor systemically at this time which is great news. No fevers, chills, nausea, vomiting, or diarrhea. 08-13-2022 upon evaluation today patient appears to be doing well currently in regard to his leg ulcers which are actually looking much better. Fortunately there does not appear to be any signs of active infection at this time which is great news. No fevers, chills, nausea, vomiting, or diarrhea. 3/7; patient with predominantly a left medial lower extremity wound as well as several smaller areas and a small area on the right lateral. His Jodie Echevaria came in today. We are using silver alginate as the primary dressing 09-03-2022 upon evaluation today patient appears to be doing well currently in regard to his wounds. He has been tolerating the dressing changes without complication. Fortunately there does not appear to be any signs of infection there is needed however for sharp debridement. 09-10-2022 upon evaluation today patient appears to be doing well currently in regard to his wound. Has been tolerating the dressing changes  33 Constitutional Obese and well-hydrated in no acute distress. Respiratory normal breathing without difficulty. Psychiatric this patient is able to make decisions and demonstrates good insight into disease process. Alert and Oriented x 3. pleasant and cooperative. Notes Upon inspection patient's wound bed actually showed signs of good granulation epithelization at this point. Fortunately I do not see any signs of worsening overall and I do believe that he is doing really quite well. In general I am pleased with the overall progress here. Electronic Signature(s) Signed: 03/05/2023 8:25:42 AM By: Allen Derry PA-C Entered By: Allen Derry on 03/05/2023 05:25:42 -------------------------------------------------------------------------------- Physician Orders Details Patient Name: Date of Service: [name], Matthew [name] 03/05/2023 8:15 A Jennette Kettle ([id number]) [id number] Page 4 of 9 Medical Record Number: [id number] Patient Account Number: [id number] Date of Birth/Sex: Treating RN: 18-Nov-1955 (67 y.o. [name] Primary Care Provider: [name] Other Clinician: Referring  Provider: Treating Provider/Extender: Matthew Folks in Treatment: 43 Verbal / Phone Orders: No Diagnosis Coding ICD-10 Coding Code Description E11.622 Type 2 diabetes mellitus with other skin ulcer I89.0 Lymphedema, not elsewhere classified L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 L97.812 Non-pressure chronic ulcer of other part of right lower leg with fat layer exposed I10 Essential (primary) hypertension I50.42 Chronic combined systolic (congestive) and diastolic (congestive) heart failure I49.9 Cardiac arrhythmia, unspecified Follow-up Appointments Return Appointment in 1 week. Bathing/ Shower/ Hygiene May shower with wound dressing protected with water repellent cover or cast protector. Anesthetic (Use 'Patient Medications' Section for Anesthetic Order Entry) Lidocaine applied to wound bed Edema Control - Lymphedema / Segmental Compressive Device / Other Elevate, Exercise Daily and A void Standing for Long Periods of Time. Elevate legs to the level of the heart and pump ankles as often as possible Elevate leg(s) parallel to the floor when sitting. Compression Pump: Use compression pump on left lower extremity for 60 minutes, twice daily. Compression Pump: Use compression pump on right lower extremity for 60 minutes, twice daily. DO YOUR BEST to sleep in the bed at night. DO NOT sleep in your recliner. Long hours of sitting in a recliner leads to swelling of the legs and/or potential wounds on your backside. Wound Treatment Wound #3 - Lower Leg Wound Laterality: Right Cleanser: Soap and Water 1 x Per Week/30 Days Discharge Instructions: Gently cleanse wound with antibacterial soap, rinse and pat dry prior to dressing wounds Prim Dressing: Silvercel Small 2x2 (in/in) 1 x Per Week/30 Days ary Discharge Instructions: Apply Silvercel Small 2x2 (in/in) as instructed Secondary Dressing: Coverlet Latex-Free Fabric  Adhesive Dressings 1 x Per Week/30 Days Discharge Instructions: 2 X 3 3/4" Electronic Signature(s) Signed: 03/05/2023 12:10:54 PM By: Angelina Pih Signed: 03/05/2023 12:35:12 PM By: Allen Derry PA-C Entered By: Angelina Pih on 03/05/2023 05:25:26 -------------------------------------------------------------------------------- Problem List Details Patient Name: Date of Service: [name] 03/05/2023 8:15 A M Medical Record Number: [id number] Patient Account Number: [id number] Date of Birth/Sex: Treating RN: [birth year] (67 y.o. [name] Primary Care Provider: [name] Other Clinician: CARSON, PRYOR ([id number]) [id number] Page 5 of 9 Referring Provider: Treating Provider/Extender: Matthew Folks in Treatment: 33 Active Problems ICD-10 Encounter Code Description Active Date MDM Diagnosis E11.622 Type 2 diabetes mellitus with other skin ulcer 07/16/2022 No Yes I89.0 Lymphedema, not elsewhere classified 07/16/2022 No Yes L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2/06/2022 No Yes L97.812 Non-pressure chronic ulcer  that he is doing really quite well. In general I am pleased with the overall progress here. Integumentary (Hair,  Skin) Wound #3 status is Open. Original cause of wound was Gradually Appeared. The date acquired was: 02/04/2023. The wound has been in treatment 4 weeks. The wound is located on the Right Lower Leg. The wound measures 0.6cm length x 0.6cm width x 0.1cm depth; 0.283cm^2 area and 0.028cm^3 volume. There is Fat Layer (Subcutaneous Tissue) exposed. There is no tunneling or undermining noted. There is a medium amount of serosanguineous drainage noted. There is large (67-100%) granulation within the wound bed. There is a small (1-33%) amount of necrotic tissue within the wound bed including Adherent Slough. Assessment Active Problems ICD-10 Type 2 diabetes mellitus with other skin ulcer Lymphedema, not elsewhere classified Non-pressure chronic ulcer of other part of left lower leg with fat layer exposed Non-pressure chronic ulcer of other part of right lower leg with fat layer exposed Essential (primary) hypertension Chronic combined systolic (congestive) and diastolic (congestive) heart failure Cardiac arrhythmia, unspecified [name], [name] ([id number]) [id number] Page 8 of 9 Plan Follow-up Appointments: Return Appointment in 1 week. Bathing/ Shower/ Hygiene: May shower with wound dressing protected with water repellent cover or cast protector. Anesthetic (Use 'Patient Medications' Section for Anesthetic Order Entry): Lidocaine applied to wound bed Edema Control - Lymphedema / Segmental Compressive Device / Other: Elevate, Exercise Daily and Avoid Standing for Long Periods of Time. Elevate legs to the level of the heart and pump ankles as often as possible Elevate leg(s) parallel to the floor when sitting. Compression Pump: Use compression pump on left lower extremity for 60 minutes, twice daily. Compression Pump: Use compression pump on right lower extremity for 60 minutes, twice daily. DO YOUR BEST to sleep in the bed at night. DO NOT sleep in your recliner.  Long hours of sitting in a recliner leads to swelling of the legs and/or potential wounds on your backside. WOUND #3: - Lower Leg Wound Laterality: Right Cleanser: Soap and Water 1 x Per Week/30 Days Discharge Instructions: Gently cleanse wound with antibacterial soap, rinse and pat dry prior to dressing wounds Prim Dressing: Silvercel Small 2x2 (in/in) 1 x Per Week/30 Days ary Discharge Instructions: Apply Silvercel Small 2x2 (in/in) as instructed Secondary Dressing: Coverlet Latex-Free Fabric Adhesive Dressings 1 x Per Week/30 Days Discharge Instructions: 2 X 3 3/4" 1. I would recommend that we have the patient continue to utilize a silver alginate dressing and using the Band-Aid to cover and then subsequently his compression socks over top. 2. I am going to recommend as well that we have the patient continue to monitor for any signs of infection or worsening. I am extremely pleased with the fact that the silver cell seems to be helping and I am going to suggest that we continue as such with this. We will see patient back for reevaluation in 1 week here in the clinic. If anything worsens or changes patient will contact our office for additional recommendations. Electronic Signature(s) Signed: 03/05/2023 8:26:06 AM By: Allen Derry PA-C Entered By: Allen Derry on 03/05/2023 05:26:06 -------------------------------------------------------------------------------- SuperBill Details Patient Name: Date of Service: GA TTODreama [name] 03/05/2023 Medical Record Number: [id number] Patient Account Number: [id number] Date of Birth/Sex: Treating RN: [birth year] (67 y.o. [name] Primary Care Provider: [name] Other Clinician: Referring Provider: Treating Provider/Extender: Matthew Folks in Treatment: 33 Diagnosis Coding ICD-10 Codes Code Description E11.622 Type 2 diabetes mellitus with other skin ulcer I89.0 Lymphedema, not  of other part of right lower leg with fat layer 07/16/2022 No Yes exposed I10 Essential (primary) hypertension 07/16/2022 No Yes I50.42 Chronic combined systolic (congestive) and diastolic (congestive) heart failure 07/16/2022 No Yes I49.9 Cardiac arrhythmia, unspecified 07/16/2022 No Yes Inactive Problems Resolved Problems Electronic Signature(s) Signed: 03/05/2023 8:18:04 AM By: Allen Derry PA-C Entered By: Allen Derry on 03/05/2023 05:18:04 -------------------------------------------------------------------------------- Progress Note Details Patient Name: Date of Service: [name], Matthew [name] 03/05/2023 8:15 A M Medical Record Number: [id number] Patient Account Number: [id number] Date of Birth/Sex: Treating RN: 1955-09-11 (67 y.o. [name] Primary Care  Provider: [name] Other Clinician: Referring Provider: Treating Provider/Extender: Matthew Folks in Treatment: 33 Subjective Chief Complaint Information obtained from Patient Bilateral LE ulcers and lymphedema DAVAUN, NAJERA ([id number]) [id number] Page 6 of 9 History of Present Illness (HPI) 07-16-2022 upon evaluation today patient appears to be doing poorly currently in regard to his his legs. The left leg is actually worse than the right. This is a patient that is previously been seen in the [place] office and at that point was doing really quite well with compression wrapping and often alginate are either Hydrofera Blue dressings. Fortunately he has gone since November 2020 until just current with out having any issue or need for wound care services which is great news. Unfortunately he is here today because he is having some issues at this point. Patient does have a history of several medical conditions which include diabetes mellitus type 2, lymphedema, hypertension, congestive heart failure, and cardiac arrhythmia though is not sure that this is actually atrial fibrillation based on what he has been told. He does have lymphedema pumps but tells me he has not used them since the summer when he had to move to a remodel of his building and subsequently never unpacked them. 07-23-2022 upon evaluation today patient appears to be doing well with regard to his legs. He is going require some sharp debridement today but overall seems to be making some progress. I am pleased in that regard he has much less drainage that he had did last week I think the compression wraps are definitely helping. 07-30-2022 upon evaluation today patient appears to be doing poorly in regard to his legs he actually has blue-green drainage which I think is consistent with Pseudomonas. I believe that we need to address this as soon as possible and I discussed that  with the patient today as well. Fortunately I do not see any signs of active infection locally nor systemically at this time which is great news. No fevers, chills, nausea, vomiting, or diarrhea. 08-13-2022 upon evaluation today patient appears to be doing well currently in regard to his leg ulcers which are actually looking much better. Fortunately there does not appear to be any signs of active infection at this time which is great news. No fevers, chills, nausea, vomiting, or diarrhea. 3/7; patient with predominantly a left medial lower extremity wound as well as several smaller areas and a small area on the right lateral. His Jodie Echevaria came in today. We are using silver alginate as the primary dressing 09-03-2022 upon evaluation today patient appears to be doing well currently in regard to his wounds. He has been tolerating the dressing changes without complication. Fortunately there does not appear to be any signs of infection there is needed however for sharp debridement. 09-10-2022 upon evaluation today patient appears to be doing well currently in regard to his wound. Has been tolerating the dressing changes  33 Constitutional Obese and well-hydrated in no acute distress. Respiratory normal breathing without difficulty. Psychiatric this patient is able to make decisions and demonstrates good insight into disease process. Alert and Oriented x 3. pleasant and cooperative. Notes Upon inspection patient's wound bed actually showed signs of good granulation epithelization at this point. Fortunately I do not see any signs of worsening overall and I do believe that he is doing really quite well. In general I am pleased with the overall progress here. Electronic Signature(s) Signed: 03/05/2023 8:25:42 AM By: Allen Derry PA-C Entered By: Allen Derry on 03/05/2023 05:25:42 -------------------------------------------------------------------------------- Physician Orders Details Patient Name: Date of Service: [name], Matthew [name] 03/05/2023 8:15 A Jennette Kettle ([id number]) [id number] Page 4 of 9 Medical Record Number: [id number] Patient Account Number: [id number] Date of Birth/Sex: Treating RN: 18-Nov-1955 (67 y.o. [name] Primary Care Provider: [name] Other Clinician: Referring  Provider: Treating Provider/Extender: Matthew Folks in Treatment: 43 Verbal / Phone Orders: No Diagnosis Coding ICD-10 Coding Code Description E11.622 Type 2 diabetes mellitus with other skin ulcer I89.0 Lymphedema, not elsewhere classified L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 L97.812 Non-pressure chronic ulcer of other part of right lower leg with fat layer exposed I10 Essential (primary) hypertension I50.42 Chronic combined systolic (congestive) and diastolic (congestive) heart failure I49.9 Cardiac arrhythmia, unspecified Follow-up Appointments Return Appointment in 1 week. Bathing/ Shower/ Hygiene May shower with wound dressing protected with water repellent cover or cast protector. Anesthetic (Use 'Patient Medications' Section for Anesthetic Order Entry) Lidocaine applied to wound bed Edema Control - Lymphedema / Segmental Compressive Device / Other Elevate, Exercise Daily and A void Standing for Long Periods of Time. Elevate legs to the level of the heart and pump ankles as often as possible Elevate leg(s) parallel to the floor when sitting. Compression Pump: Use compression pump on left lower extremity for 60 minutes, twice daily. Compression Pump: Use compression pump on right lower extremity for 60 minutes, twice daily. DO YOUR BEST to sleep in the bed at night. DO NOT sleep in your recliner. Long hours of sitting in a recliner leads to swelling of the legs and/or potential wounds on your backside. Wound Treatment Wound #3 - Lower Leg Wound Laterality: Right Cleanser: Soap and Water 1 x Per Week/30 Days Discharge Instructions: Gently cleanse wound with antibacterial soap, rinse and pat dry prior to dressing wounds Prim Dressing: Silvercel Small 2x2 (in/in) 1 x Per Week/30 Days ary Discharge Instructions: Apply Silvercel Small 2x2 (in/in) as instructed Secondary Dressing: Coverlet Latex-Free Fabric  Adhesive Dressings 1 x Per Week/30 Days Discharge Instructions: 2 X 3 3/4" Electronic Signature(s) Signed: 03/05/2023 12:10:54 PM By: Angelina Pih Signed: 03/05/2023 12:35:12 PM By: Allen Derry PA-C Entered By: Angelina Pih on 03/05/2023 05:25:26 -------------------------------------------------------------------------------- Problem List Details Patient Name: Date of Service: [name] 03/05/2023 8:15 A M Medical Record Number: [id number] Patient Account Number: [id number] Date of Birth/Sex: Treating RN: [birth year] (67 y.o. [name] Primary Care Provider: [name] Other Clinician: CARSON, PRYOR ([id number]) [id number] Page 5 of 9 Referring Provider: Treating Provider/Extender: Matthew Folks in Treatment: 33 Active Problems ICD-10 Encounter Code Description Active Date MDM Diagnosis E11.622 Type 2 diabetes mellitus with other skin ulcer 07/16/2022 No Yes I89.0 Lymphedema, not elsewhere classified 07/16/2022 No Yes L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2/06/2022 No Yes L97.812 Non-pressure chronic ulcer  33 Constitutional Obese and well-hydrated in no acute distress. Respiratory normal breathing without difficulty. Psychiatric this patient is able to make decisions and demonstrates good insight into disease process. Alert and Oriented x 3. pleasant and cooperative. Notes Upon inspection patient's wound bed actually showed signs of good granulation epithelization at this point. Fortunately I do not see any signs of worsening overall and I do believe that he is doing really quite well. In general I am pleased with the overall progress here. Electronic Signature(s) Signed: 03/05/2023 8:25:42 AM By: Allen Derry PA-C Entered By: Allen Derry on 03/05/2023 05:25:42 -------------------------------------------------------------------------------- Physician Orders Details Patient Name: Date of Service: [name], Matthew [name] 03/05/2023 8:15 A Jennette Kettle ([id number]) [id number] Page 4 of 9 Medical Record Number: [id number] Patient Account Number: [id number] Date of Birth/Sex: Treating RN: 18-Nov-1955 (67 y.o. [name] Primary Care Provider: [name] Other Clinician: Referring  Provider: Treating Provider/Extender: Matthew Folks in Treatment: 43 Verbal / Phone Orders: No Diagnosis Coding ICD-10 Coding Code Description E11.622 Type 2 diabetes mellitus with other skin ulcer I89.0 Lymphedema, not elsewhere classified L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 L97.812 Non-pressure chronic ulcer of other part of right lower leg with fat layer exposed I10 Essential (primary) hypertension I50.42 Chronic combined systolic (congestive) and diastolic (congestive) heart failure I49.9 Cardiac arrhythmia, unspecified Follow-up Appointments Return Appointment in 1 week. Bathing/ Shower/ Hygiene May shower with wound dressing protected with water repellent cover or cast protector. Anesthetic (Use 'Patient Medications' Section for Anesthetic Order Entry) Lidocaine applied to wound bed Edema Control - Lymphedema / Segmental Compressive Device / Other Elevate, Exercise Daily and A void Standing for Long Periods of Time. Elevate legs to the level of the heart and pump ankles as often as possible Elevate leg(s) parallel to the floor when sitting. Compression Pump: Use compression pump on left lower extremity for 60 minutes, twice daily. Compression Pump: Use compression pump on right lower extremity for 60 minutes, twice daily. DO YOUR BEST to sleep in the bed at night. DO NOT sleep in your recliner. Long hours of sitting in a recliner leads to swelling of the legs and/or potential wounds on your backside. Wound Treatment Wound #3 - Lower Leg Wound Laterality: Right Cleanser: Soap and Water 1 x Per Week/30 Days Discharge Instructions: Gently cleanse wound with antibacterial soap, rinse and pat dry prior to dressing wounds Prim Dressing: Silvercel Small 2x2 (in/in) 1 x Per Week/30 Days ary Discharge Instructions: Apply Silvercel Small 2x2 (in/in) as instructed Secondary Dressing: Coverlet Latex-Free Fabric  Adhesive Dressings 1 x Per Week/30 Days Discharge Instructions: 2 X 3 3/4" Electronic Signature(s) Signed: 03/05/2023 12:10:54 PM By: Angelina Pih Signed: 03/05/2023 12:35:12 PM By: Allen Derry PA-C Entered By: Angelina Pih on 03/05/2023 05:25:26 -------------------------------------------------------------------------------- Problem List Details Patient Name: Date of Service: [name] 03/05/2023 8:15 A M Medical Record Number: [id number] Patient Account Number: [id number] Date of Birth/Sex: Treating RN: [birth year] (67 y.o. [name] Primary Care Provider: [name] Other Clinician: CARSON, PRYOR ([id number]) [id number] Page 5 of 9 Referring Provider: Treating Provider/Extender: Matthew Folks in Treatment: 33 Active Problems ICD-10 Encounter Code Description Active Date MDM Diagnosis E11.622 Type 2 diabetes mellitus with other skin ulcer 07/16/2022 No Yes I89.0 Lymphedema, not elsewhere classified 07/16/2022 No Yes L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2/06/2022 No Yes L97.812 Non-pressure chronic ulcer  that he is doing really quite well. In general I am pleased with the overall progress here. Integumentary (Hair,  Skin) Wound #3 status is Open. Original cause of wound was Gradually Appeared. The date acquired was: 02/04/2023. The wound has been in treatment 4 weeks. The wound is located on the Right Lower Leg. The wound measures 0.6cm length x 0.6cm width x 0.1cm depth; 0.283cm^2 area and 0.028cm^3 volume. There is Fat Layer (Subcutaneous Tissue) exposed. There is no tunneling or undermining noted. There is a medium amount of serosanguineous drainage noted. There is large (67-100%) granulation within the wound bed. There is a small (1-33%) amount of necrotic tissue within the wound bed including Adherent Slough. Assessment Active Problems ICD-10 Type 2 diabetes mellitus with other skin ulcer Lymphedema, not elsewhere classified Non-pressure chronic ulcer of other part of left lower leg with fat layer exposed Non-pressure chronic ulcer of other part of right lower leg with fat layer exposed Essential (primary) hypertension Chronic combined systolic (congestive) and diastolic (congestive) heart failure Cardiac arrhythmia, unspecified [name], [name] ([id number]) [id number] Page 8 of 9 Plan Follow-up Appointments: Return Appointment in 1 week. Bathing/ Shower/ Hygiene: May shower with wound dressing protected with water repellent cover or cast protector. Anesthetic (Use 'Patient Medications' Section for Anesthetic Order Entry): Lidocaine applied to wound bed Edema Control - Lymphedema / Segmental Compressive Device / Other: Elevate, Exercise Daily and Avoid Standing for Long Periods of Time. Elevate legs to the level of the heart and pump ankles as often as possible Elevate leg(s) parallel to the floor when sitting. Compression Pump: Use compression pump on left lower extremity for 60 minutes, twice daily. Compression Pump: Use compression pump on right lower extremity for 60 minutes, twice daily. DO YOUR BEST to sleep in the bed at night. DO NOT sleep in your recliner.  Long hours of sitting in a recliner leads to swelling of the legs and/or potential wounds on your backside. WOUND #3: - Lower Leg Wound Laterality: Right Cleanser: Soap and Water 1 x Per Week/30 Days Discharge Instructions: Gently cleanse wound with antibacterial soap, rinse and pat dry prior to dressing wounds Prim Dressing: Silvercel Small 2x2 (in/in) 1 x Per Week/30 Days ary Discharge Instructions: Apply Silvercel Small 2x2 (in/in) as instructed Secondary Dressing: Coverlet Latex-Free Fabric Adhesive Dressings 1 x Per Week/30 Days Discharge Instructions: 2 X 3 3/4" 1. I would recommend that we have the patient continue to utilize a silver alginate dressing and using the Band-Aid to cover and then subsequently his compression socks over top. 2. I am going to recommend as well that we have the patient continue to monitor for any signs of infection or worsening. I am extremely pleased with the fact that the silver cell seems to be helping and I am going to suggest that we continue as such with this. We will see patient back for reevaluation in 1 week here in the clinic. If anything worsens or changes patient will contact our office for additional recommendations. Electronic Signature(s) Signed: 03/05/2023 8:26:06 AM By: Allen Derry PA-C Entered By: Allen Derry on 03/05/2023 05:26:06 -------------------------------------------------------------------------------- SuperBill Details Patient Name: Date of Service: [place] TTODreama [name] 03/05/2023 Medical Record Number: [id number] Patient Account Number: [id number] Date of Birth/Sex: Treating RN: [birth year] (67 y.o. [name] Primary Care Provider: [name] Other Clinician: Referring Provider: Treating Provider/Extender: Matthew Folks in Treatment: 33 Diagnosis Coding ICD-10 Codes Code Description E11.622 Type 2 diabetes mellitus with other skin ulcer I89.0 Lymphedema, not  that he is doing really quite well. In general I am pleased with the overall progress here. Integumentary (Hair,  Skin) Wound #3 status is Open. Original cause of wound was Gradually Appeared. The date acquired was: 02/04/2023. The wound has been in treatment 4 weeks. The wound is located on the Right Lower Leg. The wound measures 0.6cm length x 0.6cm width x 0.1cm depth; 0.283cm^2 area and 0.028cm^3 volume. There is Fat Layer (Subcutaneous Tissue) exposed. There is no tunneling or undermining noted. There is a medium amount of serosanguineous drainage noted. There is large (67-100%) granulation within the wound bed. There is a small (1-33%) amount of necrotic tissue within the wound bed including Adherent Slough. Assessment Active Problems ICD-10 Type 2 diabetes mellitus with other skin ulcer Lymphedema, not elsewhere classified Non-pressure chronic ulcer of other part of left lower leg with fat layer exposed Non-pressure chronic ulcer of other part of right lower leg with fat layer exposed Essential (primary) hypertension Chronic combined systolic (congestive) and diastolic (congestive) heart failure Cardiac arrhythmia, unspecified [name], [name] ([id number]) [id number] Page 8 of 9 Plan Follow-up Appointments: Return Appointment in 1 week. Bathing/ Shower/ Hygiene: May shower with wound dressing protected with water repellent cover or cast protector. Anesthetic (Use 'Patient Medications' Section for Anesthetic Order Entry): Lidocaine applied to wound bed Edema Control - Lymphedema / Segmental Compressive Device / Other: Elevate, Exercise Daily and Avoid Standing for Long Periods of Time. Elevate legs to the level of the heart and pump ankles as often as possible Elevate leg(s) parallel to the floor when sitting. Compression Pump: Use compression pump on left lower extremity for 60 minutes, twice daily. Compression Pump: Use compression pump on right lower extremity for 60 minutes, twice daily. DO YOUR BEST to sleep in the bed at night. DO NOT sleep in your recliner.  Long hours of sitting in a recliner leads to swelling of the legs and/or potential wounds on your backside. WOUND #3: - Lower Leg Wound Laterality: Right Cleanser: Soap and Water 1 x Per Week/30 Days Discharge Instructions: Gently cleanse wound with antibacterial soap, rinse and pat dry prior to dressing wounds Prim Dressing: Silvercel Small 2x2 (in/in) 1 x Per Week/30 Days ary Discharge Instructions: Apply Silvercel Small 2x2 (in/in) as instructed Secondary Dressing: Coverlet Latex-Free Fabric Adhesive Dressings 1 x Per Week/30 Days Discharge Instructions: 2 X 3 3/4" 1. I would recommend that we have the patient continue to utilize a silver alginate dressing and using the Band-Aid to cover and then subsequently his compression socks over top. 2. I am going to recommend as well that we have the patient continue to monitor for any signs of infection or worsening. I am extremely pleased with the fact that the silver cell seems to be helping and I am going to suggest that we continue as such with this. We will see patient back for reevaluation in 1 week here in the clinic. If anything worsens or changes patient will contact our office for additional recommendations. Electronic Signature(s) Signed: 03/05/2023 8:26:06 AM By: Allen Derry PA-C Entered By: Allen Derry on 03/05/2023 05:26:06 -------------------------------------------------------------------------------- SuperBill Details Patient Name: Date of Service: [place] TTODreama [name] 03/05/2023 Medical Record Number: [id number] Patient Account Number: [id number] Date of Birth/Sex: Treating RN: [birth year] (67 y.o. [name] Primary Care Provider: [name] Other Clinician: Referring Provider: Treating Provider/Extender: Matthew Folks in Treatment: 33 Diagnosis Coding ICD-10 Codes Code Description E11.622 Type 2 diabetes mellitus with other skin ulcer I89.0 Lymphedema, not  is not really good to alleviate his discomfort. We definitely can need to change his wrap out however in short order. 12-31-2022 upon evaluation today patient appears to be doing well currently in regard to his wound. He has been tolerating the dressing changes without complication. This actually looks to be doing much better today compared to where we were. Fortunately I do not see any evidence of active infection locally or systemically which is great news. 01-11-2023 upon evaluation today patient appears to be doing well currently in regard to his wound which is actually showing signs of significant improvement. Fortunately I do not see any signs of infection locally or systemically which is great news and in general I do believe that we are making good headway towards complete closure. 01-19-2023 upon evaluation today patient appears to be doing well currently hemoglobin to his wound. He has been tolerating the dressing changes without complication. Fortunately the wound is  not infected unfortunately it is a little bigger due to the fact that the wrap slid down. I do not see any signs of worsening or infection at this time. 01-28-2023 upon evaluation today patient appears to be doing well currently in regard to his wound. He has been tolerating the dressing changes without complication. Fortunately I do not see any signs of infection the compression wrap is doing a really good job and I think it worked pretty much just about healed. CAYD, REVEL ([id number]) [id number] Page 3 of 9 02-04-2023 upon evaluation today patient appears to be doing excellent in regard to his wounds. In fact he is pretty much completely healed on the left although I think this may have just a pinpoint opening. Nonetheless I think it is 1 more week to toughen up. 02-16-2023 upon evaluation today patient appears to be doing well currently in regard to his wound. He has been tolerating the dressing changes without complication. Fortunately there does not appear to be any evidence of active infection locally or systemically which is good news. In fact the original wounds we been taking care of on the left and right legs are completely healed unfortunately his wrap slipped down the right leg and there are 2 small areas on the anterior portion of his leg which are open at this point. When I wrapped his right leg the left leg is completely closed however he is in agreement to his own compression sock. 02-26-2023 upon evaluation today patient appears to be doing well currently in regard to his left leg which is still close in the right leg is doing significantly better. Fortunately there does not appear to be any signs of active infection locally or systemically which is great news and in general I do believe that we will making headway towards complete closure. 03-05-2023 upon evaluation today patient actually appears to be doing excellent in regard to his wounds one of the  areas healed the other is significantly improved. I am actually very pleased with where we stand today and I think that he is doing well with his compression stockings which is excellent as well. Electronic Signature(s) Signed: 03/05/2023 8:25:13 AM By: Allen Derry PA-C Entered By: Allen Derry on 03/05/2023 05:25:13 -------------------------------------------------------------------------------- Physical Exam Details Patient Name: Date of Service: [name], Matthew [name] 03/05/2023 8:15 A M Medical Record Number: [id number] Patient Account Number: [id number] Date of Birth/Sex: Treating RN: April 05, 1956 (67 y.o. [name] Primary Care Provider: [name] Other Clinician: Referring Provider: Treating Provider/Extender: Matthew Folks in Treatment:

## 2023-03-05 NOTE — Progress Notes (Signed)
Leg Wound Status: Open Wounding Event: Gradually Appeared Comorbid Arrhythmia, Congestive Heart Failure, Hypertension, Type II Date Acquired: 02/04/2023 History: Diabetes Weeks Of Treatment: 4 Clustered Wound: No Photos [name], [name] ([id number]) [phone].pdf Page 8 of 9 Wound Measurements Length: (cm) 0.6 Width: (cm) 0.6 Depth: (cm) 0.1 Area: (cm) 0.283 Volume: (cm)  0.028 % Reduction in Area: 76.9% % Reduction in Volume: 88.6% Epithelialization: Small (1-33%) Tunneling: No Undermining: No Wound Description Classification: Partial Thickness Exudate Amount: Medium Exudate Type: Serosanguineous Exudate Color: red, brown Foul Odor After Cleansing: No Slough/Fibrino Yes Wound Bed Granulation Amount: Large (67-100%) Exposed Structure Necrotic Amount: Small (1-33%) Fascia Exposed: No Necrotic Quality: Adherent Slough Fat Layer (Subcutaneous Tissue) Exposed: Yes Tendon Exposed: No Muscle Exposed: No Joint Exposed: No Bone Exposed: No Treatment Notes Wound #3 (Lower Leg) Wound Laterality: Right Cleanser Soap and Water Discharge Instruction: Gently cleanse wound with antibacterial soap, rinse and pat dry prior to dressing wounds Peri-Wound Care Topical Primary Dressing Silvercel Small 2x2 (in/in) Discharge Instruction: Apply Silvercel Small 2x2 (in/in) as instructed Secondary Dressing Coverlet Latex-Free Fabric Adhesive Dressings Discharge Instruction: 2 X 3 3/4" Secured With Compression Wrap Compression Stockings Add-Ons Electronic Signature(s) Signed: 03/05/2023 12:10:54 PM By: [name] Entered By: [name] on 03/05/2023 05:14:54 [name] ([id number]) [id number] Page 9 of 9 -------------------------------------------------------------------------------- Vitals Details Patient Name: Date of Service: [name] 03/05/2023 8:15 A M Medical Record Number: [id number] Patient Account Number: [id number] Date of Birth/Sex: Treating RN: [birth year] (67 y.o. [name] Primary Care [name]: [name] Other Clinician: Referring [name]: Treating [name]/Extender: [name] in Treatment: 33 Vital Signs Time Taken: 08:05 Temperature (F): 97.8 Height (in): 73 Pulse (bpm): 82 Weight (lbs): 358 Respiratory Rate (breaths/min): 20 Body Mass Index  (BMI): 47.2 Blood Pressure (mmHg): 124/77 Reference Range: 80 - 120 mg / dl Electronic Signature(s) Signed: 03/05/2023 12:10:54 PM By: [name] Entered By: [name] on 03/05/2023 05:07:22  N/A Comorbid History: Hypertension, Type II Diabetes 02/04/2023 N/A N/A Date Acquired: 4 N/A N/A Weeks of Treatment: Open N/A N/A Wound Status: No N/A N/A Wound Recurrence: 0.6x0.6x0.1 N/A N/A Measurements L x W x D (cm) 0.283 N/A N/A A (cm) : rea 0.028 N/A N/A Volume (cm) : 76.90% N/A N/A % Reduction in A rea: 88.60% N/A N/A % Reduction in Volume: Partial Thickness N/A N/A Classification: Medium N/A N/A Exudate A mount: Serosanguineous N/A N/A Exudate Type: red, brown N/A N/A Exudate Color: Large (67-100%) N/A N/A Granulation A mount: Small (1-33%) N/A N/A Necrotic A mount: Fat Layer (Subcutaneous Tissue): Yes N/A N/A Exposed Structures: Fascia: No Tendon: No Muscle: No Joint: No Bone: No Small (1-33%) N/A N/A Epithelialization: Treatment Notes Electronic Signature(s) Signed: 03/05/2023 12:10:54 PM By: [name] Entered By: [name] on 03/05/2023 05:18:46 -------------------------------------------------------------------------------- Multi-Disciplinary Care Plan Details Patient Name: Date of Service: Matthew Burly [name] 03/05/2023 8:15 A M Medical Record Number: [id number] Patient Account Number: [id number] Date of Birth/Sex: Treating RN: 1955-06-22 (67 y.o. [name] Primary Care  [name]: [name] Other Clinician: Referring [name]: Treating [name]/Extender: [name] in Treatment: 33 Active Inactive Venous Leg Ulcer Nursing Diagnoses: Actual venous Insuffiency (use after diagnosis is confirmed) Goals: Patient will maintain optimal edema control Date Initiated: 10/15/2022 Target Resolution Date: 03/12/2023 Goal Status: Active Patient/caregiver will verbalize understanding of disease process and disease management [name], [name] ([id number]) [phone].pdf Page 6 of 9 Date Initiated: 10/15/2022 Date Inactivated: 11/12/2022 Target Resolution Date: 10/15/2022 Goal Status: Met Verify adequate tissue perfusion prior to therapeutic compression application Date Initiated: 10/15/2022 Date Inactivated: 11/12/2022 Target Resolution Date: 10/15/2022 Goal Status: Met Interventions: Assess peripheral edema status every visit. Compression as ordered Provide education on venous insufficiency Treatment Activities: Therapeutic compression applied : 10/15/2022 Notes: Electronic Signature(s) Signed: 03/05/2023 12:10:54 PM By: [name] Entered By: [name] on 03/05/2023 05:27:10 -------------------------------------------------------------------------------- Pain Assessment Details Patient Name: Date of Service: Matthew Burly [name] 03/05/2023 8:15 A M Medical Record Number: [id number] Patient Account Number: [id number] Date of Birth/Sex: Treating RN: [birth year] (67 y.o. [name] Primary Care [name]: [name] Other Clinician: Referring [name]: Treating [name]: [name] in Treatment: 33 Active Problems Location of Pain Severity and Description of Pain Patient Has Paino No Site Locations Rate the pain. Current Pain Level: 0 Pain Management and Medication Current Pain Management: Electronic Signature(s) Signed: 03/05/2023 12:10:54 PM By: [name] Entered By: [name] on 03/05/2023 05:07:45 [name] ([id number]) [id number] Page 7 of 9 -------------------------------------------------------------------------------- Patient/Caregiver Education Details Patient Name: Date of Service: [name] 9/20/2024andnbsp8:15 A M Medical Record Number: [id number] Patient Account Number: [id number] Date of Birth/Gender: Treating RN: 08/12/1955 (67 y.o. [name] Primary Care Physician: [name] Other Clinician: Referring Physician: Treating Physician/Extender: [name] in Treatment: 37 Education Assessment Education Provided To: Patient Education Topics Provided Wound/Skin Impairment: Handouts: Caring for Your Ulcer Methods: Explain/Verbal Responses: State content correctly Electronic Signature(s) Signed: 03/05/2023 12:10:54 PM By: [name] Entered By: [name] on 03/05/2023 05:27:18 -------------------------------------------------------------------------------- Wound Assessment Details Patient Name: Date of Service: Matthew Macadam J. 03/05/2023 8:15 A M Medical Record Number: [id number] Patient Account Number: [id number] Date of Birth/Sex: Treating RN: January 02, 1956 (67 y.o. [name] Primary Care Laterica Matarazzo: [name] Other Clinician: Referring Antinette Keough: Treating Tallen Schnorr/Extender: [name] in Treatment: 33 Wound Status Wound Number: 3 Primary Lymphedema Etiology: Wound Location: Right Lower  [name], [name] ([id number]) [id number] Page 1 of 9 Visit Report for 03/05/2023 Arrival Information Details Patient Name: Date of Service: [name] 03/05/2023 8:15 A M Medical Record Number: [id number] Patient Account Number: [id number] Date of Birth/Sex: Treating RN: [birth year] (67 y.o. [name] Primary Care Djuan Talton: [name] Other Clinician: Referring Pierce Biagini: Treating Maxximus Gotay/Extender: [name] in Treatment: 33 Visit Information History Since Last Visit Added or deleted any medications: No Patient Arrived: [name] Any new allergies or adverse reactions: No Arrival Time: 08:04 Had a fall or experienced change in No Accompanied By: self activities of daily living that may affect Transfer Assistance: None risk of falls: Patient Identification Verified: Yes Hospitalized since last visit: No Secondary Verification Process Completed: Yes Has Dressing in Place as Prescribed: Yes Patient Requires Transmission-Based Precautions: No Has Compression in Place as Prescribed: Yes Patient Has Alerts: No Pain Present Now: Yes Electronic Signature(s) Signed: 03/05/2023 12:10:54 PM By: [name] Entered By: [name] on 03/05/2023 05:05:10 -------------------------------------------------------------------------------- Clinic Level of Care Assessment Details Patient Name: Date of Service: [name] 03/05/2023 8:15 A M Medical Record Number: [id number] Patient Account Number: [id number] Date of Birth/Sex: Treating RN: Dec 24, 1955 (67 y.o. [name] Primary Care Brittanya Winburn: [name] Other Clinician: Referring Deondrick Searls: Treating Telesa Jeancharles/Extender: [name] in Treatment: 33 Clinic Level of Care Assessment Items TOOL 4 Quantity Score []  - 0 Use when only an EandM is performed on FOLLOW-UP visit ASSESSMENTS - Nursing Assessment / Reassessment X- 1  10 Reassessment of Co-morbidities (includes updates in patient status) X- 1 5 Reassessment of Adherence to Treatment Plan ASSESSMENTS - Wound and Skin A ssessment / Reassessment X - Simple Wound Assessment / Reassessment - one wound 1 5 [name], [name] ([id number]) [phone].pdf Page 2 of 9 []  - 0 Complex Wound Assessment / Reassessment - multiple wounds []  - 0 Dermatologic / Skin Assessment (not related to wound area) ASSESSMENTS - Focused Assessment []  - 0 Circumferential Edema Measurements - multi extremities []  - 0 Nutritional Assessment / Counseling / Intervention []  - 0 Lower Extremity Assessment (monofilament, tuning fork, pulses) []  - 0 Peripheral Arterial Disease Assessment (using hand held doppler) ASSESSMENTS - Ostomy and/or Continence Assessment and Care []  - 0 Incontinence Assessment and Management []  - 0 Ostomy Care Assessment and Management (repouching, etc.) PROCESS - Coordination of Care X - Simple Patient / Family Education for ongoing care 1 15 []  - 0 Complex (extensive) Patient / Family Education for ongoing care X- 1 10 Staff obtains Chiropractor, Records, T Results / Process Orders est []  - 0 Staff telephones HHA, Nursing Homes / Clarify orders / etc []  - 0 Routine Transfer to another Facility (non-emergent condition) []  - 0 Routine Hospital Admission (non-emergent condition) []  - 0 New Admissions / Manufacturing engineer / Ordering NPWT Apligraf, etc. , []  - 0 Emergency Hospital Admission (emergent condition) X- 1 10 Simple Discharge Coordination []  - 0 Complex (extensive) Discharge Coordination PROCESS - Special Needs []  - 0 Pediatric / Minor Patient Management []  - 0 Isolation Patient Management []  - 0 Hearing / Language / Visual special needs []  - 0 Assessment of Community assistance (transportation, D/C planning, etc.) []  - 0 Additional assistance / Altered mentation []  - 0 Support Surface(s) Assessment (bed,  cushion, seat, etc.) INTERVENTIONS - Wound Cleansing / Measurement X - Simple Wound Cleansing - one wound 1 5 []  - 0 Complex Wound Cleansing - multiple wounds X- 1 5 Wound Imaging (  [name], [name] ([id number]) [id number] Page 1 of 9 Visit Report for 03/05/2023 Arrival Information Details Patient Name: Date of Service: [name] 03/05/2023 8:15 A M Medical Record Number: [id number] Patient Account Number: [id number] Date of Birth/Sex: Treating RN: [birth year] (67 y.o. [name] Primary Care Djuan Talton: [name] Other Clinician: Referring Pierce Biagini: Treating Maxximus Gotay/Extender: [name] in Treatment: 33 Visit Information History Since Last Visit Added or deleted any medications: No Patient Arrived: [name] Any new allergies or adverse reactions: No Arrival Time: 08:04 Had a fall or experienced change in No Accompanied By: self activities of daily living that may affect Transfer Assistance: None risk of falls: Patient Identification Verified: Yes Hospitalized since last visit: No Secondary Verification Process Completed: Yes Has Dressing in Place as Prescribed: Yes Patient Requires Transmission-Based Precautions: No Has Compression in Place as Prescribed: Yes Patient Has Alerts: No Pain Present Now: Yes Electronic Signature(s) Signed: 03/05/2023 12:10:54 PM By: [name] Entered By: [name] on 03/05/2023 05:05:10 -------------------------------------------------------------------------------- Clinic Level of Care Assessment Details Patient Name: Date of Service: [name] 03/05/2023 8:15 A M Medical Record Number: [id number] Patient Account Number: [id number] Date of Birth/Sex: Treating RN: Dec 24, 1955 (67 y.o. [name] Primary Care Brittanya Winburn: [name] Other Clinician: Referring Deondrick Searls: Treating Telesa Jeancharles/Extender: [name] in Treatment: 33 Clinic Level of Care Assessment Items TOOL 4 Quantity Score []  - 0 Use when only an EandM is performed on FOLLOW-UP visit ASSESSMENTS - Nursing Assessment / Reassessment X- 1  10 Reassessment of Co-morbidities (includes updates in patient status) X- 1 5 Reassessment of Adherence to Treatment Plan ASSESSMENTS - Wound and Skin A ssessment / Reassessment X - Simple Wound Assessment / Reassessment - one wound 1 5 [name], [name] ([id number]) [phone].pdf Page 2 of 9 []  - 0 Complex Wound Assessment / Reassessment - multiple wounds []  - 0 Dermatologic / Skin Assessment (not related to wound area) ASSESSMENTS - Focused Assessment []  - 0 Circumferential Edema Measurements - multi extremities []  - 0 Nutritional Assessment / Counseling / Intervention []  - 0 Lower Extremity Assessment (monofilament, tuning fork, pulses) []  - 0 Peripheral Arterial Disease Assessment (using hand held doppler) ASSESSMENTS - Ostomy and/or Continence Assessment and Care []  - 0 Incontinence Assessment and Management []  - 0 Ostomy Care Assessment and Management (repouching, etc.) PROCESS - Coordination of Care X - Simple Patient / Family Education for ongoing care 1 15 []  - 0 Complex (extensive) Patient / Family Education for ongoing care X- 1 10 Staff obtains Chiropractor, Records, T Results / Process Orders est []  - 0 Staff telephones HHA, Nursing Homes / Clarify orders / etc []  - 0 Routine Transfer to another Facility (non-emergent condition) []  - 0 Routine Hospital Admission (non-emergent condition) []  - 0 New Admissions / Manufacturing engineer / Ordering NPWT Apligraf, etc. , []  - 0 Emergency Hospital Admission (emergent condition) X- 1 10 Simple Discharge Coordination []  - 0 Complex (extensive) Discharge Coordination PROCESS - Special Needs []  - 0 Pediatric / Minor Patient Management []  - 0 Isolation Patient Management []  - 0 Hearing / Language / Visual special needs []  - 0 Assessment of Community assistance (transportation, D/C planning, etc.) []  - 0 Additional assistance / Altered mentation []  - 0 Support Surface(s) Assessment (bed,  cushion, seat, etc.) INTERVENTIONS - Wound Cleansing / Measurement X - Simple Wound Cleansing - one wound 1 5 []  - 0 Complex Wound Cleansing - multiple wounds X- 1 5 Wound Imaging (

## 2023-03-08 ENCOUNTER — Ambulatory Visit (INDEPENDENT_AMBULATORY_CARE_PROVIDER_SITE_OTHER): Payer: PPO | Admitting: Podiatry

## 2023-03-08 DIAGNOSIS — B351 Tinea unguium: Secondary | ICD-10-CM

## 2023-03-08 DIAGNOSIS — M79674 Pain in right toe(s): Secondary | ICD-10-CM | POA: Diagnosis not present

## 2023-03-08 DIAGNOSIS — M79675 Pain in left toe(s): Secondary | ICD-10-CM | POA: Diagnosis not present

## 2023-03-08 NOTE — Progress Notes (Signed)
Subjective:  Patient ID: IZAEAH HLAVAC, male    DOB: [birth year],  MRN: [id number]   [name] presents to clinic today for:  Chief Complaint  Patient presents with   Nail Problem    Nail trim   . Patient notes nails are thick, discolored, elongated and painful in shoegear when trying to ambulate.  He sees wound care in [place] weekly for over 30 weeks for leg edema & wounds.  He states he should be d/c after this week's visit.   PCP is [name], MD.  Past Medical History:  Diagnosis Date   Arthritis    Diabetes mellitus without complication (HCC)    Hypertension    Lymphedema 09/09/2020   Wound drainage    right leg 3/4 inch area open wound with clear drainage last 2 days, covering with gauze prn    Past Surgical History:  Procedure Laterality Date   COLONOSCOPY  2014 ?   FLEXIBLE SIGMOIDOSCOPY N/A 08/27/2014   Procedure: FLEXIBLE SIGMOIDOSCOPY;  Surgeon: [name], MD;  Location: [facility];  Service: Endoscopy;  Laterality: N/A;   NO PAST SURGERIES     RIGHT/LEFT HEART CATH AND CORONARY ANGIOGRAPHY N/A 09/10/2020   Procedure: RIGHT/LEFT HEART CATH AND CORONARY ANGIOGRAPHY;  Surgeon: [name], MD;  Location: [facility]AS[facility];  Service: Cardiovascular;  Laterality: N/A;    No Known Allergies  Review of Systems: Negative except as noted in the HPI.  Objective:  BRAXSON BRANDSMA is a pleasant 67 y.o. male in NAD. AAO x 3.  Vascular Examination: Capillary refill time is 3-5 seconds to toes bilateral. Trace palpable pedal pulses b/l LE. Digital hair sparse b/l.  Skin temperature gradient WNL b/l.  +1 pitting edema in forefoot.  Rearfoot and legs not evaluated due to compressive wraps in place.   Dermatological Examination: Pedal skin with normal turgor, texture and tone b/l. No open wounds. No interdigital macerations b/l. Toenails x10 are 3mm thick, discolored, dystrophic with subungual debris. There is pain with compression of the nail  plates.  They are elongated x10  Assessment/Plan: 1. Pain due to onychomycosis of toenails of both feet     The mycotic toenails were sharply debrided x10 with sterile nail nippers and a power debriding burr to decrease bulk/thickness and length.    Return in about 3 months (around 06/07/2023) for [facility].   Clerance Lav, DPM, FACFAS [facility]     [address] [place], [place] [address]                Office [phone]  Fax [phone]

## 2023-03-12 ENCOUNTER — Ambulatory Visit: Payer: PPO | Admitting: Physician Assistant

## 2023-03-19 ENCOUNTER — Encounter: Payer: PPO | Attending: Physician Assistant | Admitting: Physician Assistant

## 2023-03-19 DIAGNOSIS — I4891 Unspecified atrial fibrillation: Secondary | ICD-10-CM | POA: Insufficient documentation

## 2023-03-19 DIAGNOSIS — L97812 Non-pressure chronic ulcer of other part of right lower leg with fat layer exposed: Secondary | ICD-10-CM | POA: Diagnosis not present

## 2023-03-19 DIAGNOSIS — I890 Lymphedema, not elsewhere classified: Secondary | ICD-10-CM | POA: Diagnosis not present

## 2023-03-19 DIAGNOSIS — I110 Hypertensive heart disease with heart failure: Secondary | ICD-10-CM | POA: Diagnosis not present

## 2023-03-19 DIAGNOSIS — I5042 Chronic combined systolic (congestive) and diastolic (congestive) heart failure: Secondary | ICD-10-CM | POA: Insufficient documentation

## 2023-03-19 DIAGNOSIS — L97822 Non-pressure chronic ulcer of other part of left lower leg with fat layer exposed: Secondary | ICD-10-CM | POA: Insufficient documentation

## 2023-03-19 DIAGNOSIS — E11622 Type 2 diabetes mellitus with other skin ulcer: Secondary | ICD-10-CM | POA: Diagnosis not present

## 2023-03-19 NOTE — Progress Notes (Addendum)
ELIGAH, ANELLO ([id number]) [id number] Page 1 of 10 Visit Report for 03/19/2023 Arrival Information Details Patient Name: Date of Service: [name] 03/19/2023 11:30 A M Medical Record Number: [id number] Patient Account Number: [id number] Date of Birth/Sex: Treating RN: 07-10-1955 (67 y.o. [name] Primary Care Finnlee Silvernail: [name] Other Clinician: Referring Jhovanny Guinta: Treating Less Woolsey/Extender: Matthew Folks in Treatment: 35 Visit Information History Since Last Visit Added or deleted any medications: No Patient Arrived: [name] Any new allergies or adverse reactions: No Arrival Time: 11:02 Had a fall or experienced change in No Accompanied By: self activities of daily living that may affect Transfer Assistance: None risk of falls: Patient Identification Verified: Yes Hospitalized since last visit: No Secondary Verification Process Completed: Yes Has Dressing in Place as Prescribed: Yes Patient Requires Transmission-Based Precautions: No Has Compression in Place as Prescribed: Yes Patient Has Alerts: No Pain Present Now: No Electronic Signature(s) Signed: 03/19/2023 12:34:40 PM By: Angelina Pih Entered By: Angelina Pih on 03/19/2023 11:03:05 -------------------------------------------------------------------------------- Clinic Level of Care Assessment Details Patient Name: Date of Service: [name] 03/19/2023 11:30 A M Medical Record Number: [id number] Patient Account Number: [id number] Date of Birth/Sex: Treating RN: Mar 24, 1956 (67 y.o. [name] Primary Care Anicka Stuckert: [name] Other Clinician: Referring Johnnae Impastato: Treating Shiasia Porro/Extender: Matthew Folks in Treatment: 35 Clinic Level of Care Assessment Items TOOL 1 Quantity Score []  - 0 Use when EandM and Procedure is performed on INITIAL visit ASSESSMENTS - Nursing Assessment / Reassessment []  -  0 General Physical Exam (combine w/ comprehensive assessment (listed just below) when performed on new pt. evals) []  - 0 Comprehensive Assessment (HX, ROS, Risk Assessments, Wounds Hx, etc.) ASSESSMENTS - Wound and Skin Assessment / Reassessment []  - 0 Dermatologic / Skin Assessment (not related to wound area) SY, SAINTJEAN ([id number]) [id number] Page 2 of 10 ASSESSMENTS - Ostomy and/or Continence Assessment and Care []  - 0 Incontinence Assessment and Management []  - 0 Ostomy Care Assessment and Management (repouching, etc.) PROCESS - Coordination of Care []  - 0 Simple Patient / Family Education for ongoing care []  - 0 Complex (extensive) Patient / Family Education for ongoing care []  - 0 Staff obtains Chiropractor, Records, T Results / Process Orders est []  - 0 Staff telephones HHA, Nursing Homes / Clarify orders / etc []  - 0 Routine Transfer to another Facility (non-emergent condition) []  - 0 Routine Hospital Admission (non-emergent condition) []  - 0 New Admissions / Manufacturing engineer / Ordering NPWT Apligraf, etc. , []  - 0 Emergency Hospital Admission (emergent condition) PROCESS - Special Needs []  - 0 Pediatric / Minor Patient Management []  - 0 Isolation Patient Management []  - 0 Hearing / Language / Visual special needs []  - 0 Assessment of Community assistance (transportation, D/C planning, etc.) []  - 0 Additional assistance / Altered mentation []  - 0 Support Surface(s) Assessment (bed, cushion, seat, etc.) INTERVENTIONS - Miscellaneous []  - 0 External ear exam []  - 0 Patient Transfer (multiple staff / Nurse, adult / Similar devices) []  - 0 Simple Staple / Suture removal (25 or less) []  - 0 Complex Staple / Suture removal (26 or more) []  - 0 Hypo/Hyperglycemic Management (do not check if billed separately) []  - 0 Ankle / Brachial Index (ABI) - do not check if billed separately Has the patient been seen at the hospital  within the last three years: Yes Total Score: 0 Level Of Care: ____ Electronic Signature(s) Signed: 03/19/2023 12:34:40 PM By: Angelina Pih  Entered By: Angelina Pih on 03/19/2023 12:32:54 -------------------------------------------------------------------------------- Compression Therapy Details Patient Name: Date of Service: [name] 03/19/2023 11:30 A M Medical Record Number: [id number] Patient Account Number: [id number] Date of Birth/Sex: Treating RN: [birth year] (67 y.o. [name] Primary Care Aneth Schlagel: [name] Other Clinician: Referring Candler Ginsberg: Treating Raney Antwine/Extender: Matthew Folks in Treatment: 35 Compression Therapy Performed for Wound Assessment: Wound #3 Right Lower Leg Performed By: Holly Bodily, RN Compression Type: Double Shelby Dubin ([id number]) [id number] Page 3 of 10 Post Procedure Diagnosis Same as Pre-procedure Electronic Signature(s) Signed: 03/19/2023 12:34:40 PM By: Angelina Pih Entered By: Angelina Pih on 03/19/2023 12:05:40 -------------------------------------------------------------------------------- Compression Therapy Details Patient Name: Date of Service: Matthew Burly [name] 03/19/2023 11:30 A M Medical Record Number: [id number] Patient Account Number: [id number] Date of Birth/Sex: Treating RN: 1956-04-30 (68 y.o. [name] Primary Care Dquan Cortopassi: [name] Other Clinician: Referring Bernice Mcauliffe: Treating Lucyann Romano/Extender: Matthew Folks in Treatment: 35 Compression Therapy Performed for Wound Assessment: NonWound Condition Lymphedema - Left Leg Performed By: Clinician Angelina Pih, RN Compression Type: Double Layer Post Procedure Diagnosis Same as Pre-procedure Electronic Signature(s) Signed: 03/19/2023 12:34:40 PM By: Angelina Pih Entered By: Angelina Pih on 03/19/2023  12:06:42 -------------------------------------------------------------------------------- Encounter Discharge Information Details Patient Name: Date of Service: Matthew Burly [name] 03/19/2023 11:30 A M Medical Record Number: [id number] Patient Account Number: [id number] Date of Birth/Sex: Treating RN: [birth year] (67 y.o. [name] Primary Care Jayona Mccaig: [name] Other Clinician: Referring [name] Daughdrill: Treating Mickell Birdwell/Extender: Matthew Folks in Treatment: 35 Encounter Discharge Information Items Discharge Condition: Stable Ambulatory Status: Cane Discharge Destination: Home Transportation: Private Auto Accompanied By: self Schedule Follow-up Appointment: Yes Clinical Summary of Care: AMANDO, CHAPUT ([id number]) [id number] Page 4 of 10 Electronic Signature(s) Signed: 03/19/2023 12:34:04 PM By: Angelina Pih Entered By: Angelina Pih on 03/19/2023 12:34:04 -------------------------------------------------------------------------------- Lower Extremity Assessment Details Patient Name: Date of Service: [name] 03/19/2023 11:30 A M Medical Record Number: [id number] Patient Account Number: [id number] Date of Birth/Sex: Treating RN: 1956/01/28 (67 y.o. [name] Primary Care Nanci Lakatos: [name] Other Clinician: Referring Taressa Rauh: Treating Ezell Melikian/Extender: Matthew Folks in Treatment: 35 Edema Assessment Assessed: [Left: No] [Right: No] Edema: [Left: Yes] [Right: Yes] Calf Left: Right: Point of Measurement: 36 cm From Medial Instep 57 cm 60 cm Ankle Left: Right: Point of Measurement: 13 cm From Medial Instep 31.2 cm 32.3 cm Vascular Assessment Pulses: Dorsalis Pedis Palpable: [Left:Yes] [Right:Yes] Extremity colors, hair growth, and conditions: Extremity Color: [Left:Hyperpigmented] [Right:Hyperpigmented] Hair Growth on Extremity: [Left:No] [Right:No] Temperature of  Extremity: [Left:Warm < 3 seconds] [Right:Warm < 3 seconds] Toe Nail Assessment Left: Right: Thick: Yes Yes Discolored: Yes Yes Deformed: No No Improper Length and Hygiene: No No Electronic Signature(s) Signed: 03/19/2023 12:34:40 PM By: Angelina Pih Entered By: Angelina Pih on 03/19/2023 11:17:30 [name] ([id number]) [id number] Page 5 of 10 -------------------------------------------------------------------------------- Multi Wound Chart Details Patient Name: Date of Service: [name] 03/19/2023 11:30 A M Medical Record Number: [id number] Patient Account Number: [id number] Date of Birth/Sex: Treating RN: [birth year] (67 y.o. [name] Primary Care Aariv Medlock: [name] Other Clinician: Referring Petula Rotolo: Treating Nashya Garlington/Extender: Matthew Folks in Treatment: 35 Vital Signs Height(in): 73 Pulse(bpm): 84 Weight(lbs): 358 Blood Pressure(mmHg): 107/66 Body Mass Index(BMI): 47.2 Temperature(F): 98.1 Respiratory Rate(breaths/min): 18 [3:Photos:] [N/A:N/A] Right Lower Leg N/A N/A Wound Location: Gradually Appeared N/A N/A Wounding Event: Lymphedema N/A N/A  Primary Etiology: Arrhythmia, Congestive Heart Failure, N/A N/A Comorbid History: Hypertension, Type II Diabetes 02/04/2023 N/A N/A Date Acquired: 6 N/A N/A Weeks of Treatment: Open N/A N/A Wound Status: No N/A N/A Wound Recurrence: 0.7x0.9x0.1 N/A N/A Measurements L x W x D (cm) 0.495 N/A N/A A (cm) : rea 0.049 N/A N/A Volume (cm) : 59.60% N/A N/A % Reduction in A rea: 80.00% N/A N/A % Reduction in Volume: Partial Thickness N/A N/A Classification: Medium N/A N/A Exudate A mount: Serosanguineous N/A N/A Exudate Type: red, brown N/A N/A Exudate Color: Medium (34-66%) N/A N/A Granulation A mount: Red N/A N/A Granulation Quality: Medium (34-66%) N/A N/A Necrotic A mount: Fat Layer (Subcutaneous Tissue): Yes N/A  N/A Exposed Structures: Fascia: No Tendon: No Muscle: No Joint: No Bone: No Small (1-33%) N/A N/A Epithelialization: Compression Therapy N/A N/A Procedures Performed: Treatment Notes Electronic Signature(s) Signed: 03/19/2023 12:34:40 PM By: Angelina Pih Entered By: Angelina Pih on 03/19/2023 12:06:30 [name] ([id number]) [id number] Page 6 of 10 -------------------------------------------------------------------------------- Multi-Disciplinary Care Plan Details Patient Name: Date of Service: [name] 03/19/2023 11:30 A M Medical Record Number: [id number] Patient Account Number: [id number] Date of Birth/Sex: Treating RN: 26-Jul-1955 (67 y.o. 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 (use after diagnosis is confirmed) Goals: Patient will maintain optimal edema control Date Initiated: 10/15/2022 Target Resolution Date: 04/15/2023 Goal Status: Active Patient/caregiver will verbalize understanding of disease process and disease management Date Initiated: 10/15/2022 Date Inactivated: 11/12/2022 Target Resolution Date: 10/15/2022 Goal Status: Met Verify adequate tissue perfusion prior to therapeutic compression application Date Initiated: 10/15/2022 Date Inactivated: 11/12/2022 Target Resolution Date: 10/15/2022 Goal Status: Met Interventions: Assess peripheral edema status every visit. Compression as ordered Provide education on venous insufficiency Treatment Activities: Therapeutic compression applied : 10/15/2022 Notes: Electronic Signature(s) Signed: 03/19/2023 12:33:18 PM By: Angelina Pih Entered By: Angelina Pih on 03/19/2023  12:33:18 -------------------------------------------------------------------------------- Non-Wound Condition Assessment Details Patient Name: Date of Service: Matthew Macadam J. 03/19/2023 11:30 A M Medical Record Number: [id number] Patient Account Number: [id number] Date of Birth/Sex: Treating RN: 1955-12-30 (67 y.o. [name] Primary Care Ashwath Lasch: [name] Other Clinician: Referring Lael Wetherbee: Treating Artrice Kraker/Extender: Graylon, Amory ([id number]) [id number] Page 7 of 10 Weeks in Treatment: 35 Non-Wound Condition: Condition: Lymphedema Location: Leg Side: Left Notes swelling noted Electronic Signature(s) Signed: 03/19/2023 12:34:40 PM By: Angelina Pih Entered By: Angelina Pih on 03/19/2023 12:06:12 -------------------------------------------------------------------------------- Pain Assessment Details Patient Name: Date of Service: Matthew Burly [name] 03/19/2023 11:30 A M Medical Record Number: [id number] Patient Account Number: [id number] Date of Birth/Sex: Treating RN: October 12, 1955 (67 y.o. [name] Primary Care Bernestine Holsapple: [name] Other Clinician: Referring Sheyli Horwitz: Treating Lilinoe Acklin/Extender: Matthew Folks in Treatment: 35 Active Problems Location of Pain Severity and Description of Pain Patient Has Paino No Site Locations Rate the pain. Current Pain Level: 0 Pain Management and Medication Current Pain Management: Electronic Signature(s) Signed: 03/19/2023 12:34:40 PM By: Angelina Pih Entered By: Angelina Pih on 03/19/2023 11:04:53 [name] ([id number]) [id number] Page 8 of 10 -------------------------------------------------------------------------------- Patient/Caregiver Education Details Patient Name: Date of Service: [name] 10/4/2024andnbsp11:30 A M Medical Record Number: [id number] Patient Account  Number: [id number] Date of Birth/Gender: Treating RN: May 05, 1956 (67 y.o. [name] Primary Care Physician: [name] Other Clinician: Referring Physician: Treating Physician/Extender: Allen Derry  Polite, Ronal Fear in Treatment: 35 Education Assessment Education Provided To: Patient Education Topics Provided Wound/Skin Impairment: Handouts: Caring for Your Ulcer Methods: Explain/Verbal Responses: State content correctly Electronic Signature(s) Signed: 03/19/2023 12:34:40 PM By: Angelina Pih Entered By: Angelina Pih on 03/19/2023 12:33:28 -------------------------------------------------------------------------------- Wound Assessment Details Patient Name: Date of Service: Matthew Macadam J. 03/19/2023 11:30 A M Medical Record Number: [id number] Patient Account Number: [id number] Date of Birth/Sex: Treating RN: 08-13-1955 (67 y.o. [name] Primary Care Keen Ewalt: [name] Other Clinician: Referring Kaeleen Odom: Treating Jayceon Troy/Extender: Matthew Folks in Treatment: 35 Wound Status Wound Number: 3 Primary Lymphedema Etiology: Wound Location: Right Lower Leg Wound Status: Open Wounding Event: Gradually Appeared Comorbid Arrhythmia, Congestive Heart Failure, Hypertension, Type II Date Acquired: 02/04/2023 History: Diabetes Weeks Of Treatment: 6 Clustered Wound: No Photos DEANDRE, BRANNAN ([id number]) [id number] Page 9 of 10 Wound Measurements Length: (cm) 0.7 Width: (cm) 0.9 Depth: (cm) 0.1 Area: (cm) 0.495 Volume: (cm) 0.049 % Reduction in Area: 59.6% % Reduction in Volume: 80% Epithelialization: Small (1-33%) Tunneling: No Undermining: No Wound Description Classification: Partial Thickness Exudate Amount: Medium Exudate Type: Serosanguineous Exudate Color: red, brown Foul Odor After Cleansing: No Slough/Fibrino Yes Wound Bed Granulation Amount: Medium (34-66%) Exposed  Structure Granulation Quality: Red Fascia Exposed: No Necrotic Amount: Medium (34-66%) Fat Layer (Subcutaneous Tissue) Exposed: Yes Necrotic Quality: Adherent Slough Tendon Exposed: No Muscle Exposed: No Joint Exposed: No Bone Exposed: No Treatment Notes Wound #3 (Lower Leg) Wound Laterality: Right Cleanser Soap and Water Discharge Instruction: Gently cleanse wound with antibacterial soap, rinse and pat dry prior to dressing wounds Peri-Wound Care Topical Primary Dressing Silvercel Small 2x2 (in/in) Discharge Instruction: Apply Silvercel Small 2x2 (in/in) as instructed Secondary Dressing ABD Pad 5x9 (in/in) Discharge Instruction: Cover with ABD pad Secured With Compression Wrap Urgo K2 Lite, two layer compression system, large Compression Stockings Add-Ons Electronic Signature(s) Signed: 03/19/2023 12:34:40 PM By: Angelina Pih Entered By: Angelina Pih on 03/19/2023 11:17:58 [name] ([id number]) [id number] Page 10 of 10 -------------------------------------------------------------------------------- Vitals Details Patient Name: Date of Service: [name] 03/19/2023 11:30 A M Medical Record Number: [id number] Patient Account Number: [id number] Date of Birth/Sex: Treating RN: [birth year] (67 y.o. [name] Primary Care Rhiannan Kievit: [name] Other Clinician: Referring Jeanita Carneiro: Treating Sereena Marando/Extender: Matthew Folks in Treatment: 35 Vital Signs Time Taken: 11:03 Temperature (F): 98.1 Height (in): 73 Pulse (bpm): 84 Weight (lbs): 358 Respiratory Rate (breaths/min): 18 Body Mass Index (BMI): 47.2 Blood Pressure (mmHg): 107/66 Reference Range: 80 - 120 mg / dl Electronic Signature(s) Signed: 03/19/2023 12:34:40 PM By: Angelina Pih Entered By: Angelina Pih on 03/19/2023 11:04:45

## 2023-03-19 NOTE — Progress Notes (Addendum)
JERMON, CHALFANT ([id number]) [id number] Page 1 of 9 Visit Report for 03/19/2023 Chief Complaint Document Details Patient Name: Date of Service: [name] 03/19/2023 11:30 A M Medical Record Number: [id number] Patient Account Number: [id number] Date of Birth/Sex: Treating RN: [birth year] (67 y.o. [name] Primary Care Provider: [name] Other Clinician: Referring Provider: Treating Provider/Extender: Matthew Folks in Treatment: 35 Information Obtained from: Patient Chief Complaint Bilateral LE ulcers and lymphedema Electronic Signature(s) Signed: 03/19/2023 11:02:28 AM By: Allen Derry PA-C Entered By: Allen Derry on 03/19/2023 11:02:28 -------------------------------------------------------------------------------- HPI Details Patient Name: Date of Service: [name], Matthew [name] 03/19/2023 11:30 A M Medical Record Number: [id number] Patient Account Number: [id number] Date of Birth/Sex: Treating RN: [birth year] (67 y.o. [name] Primary Care Provider: [name] Other Clinician: Referring Provider: Treating Provider/Extender: Matthew Folks in Treatment: 35 History of Present Illness HPI Description: 07-16-2022 upon evaluation today patient appears to be doing poorly currently in regard to his his legs. The left leg is actually worse than the right. This is a patient that is previously been seen in the [place] office and at that point was doing really quite well with compression wrapping and often alginate are either Hydrofera Blue dressings. Fortunately he has gone since November 2020 until just current with out having any issue or need for wound care services which is great news. Unfortunately he is here today because he is having some issues at this point. Patient does have a history of several medical conditions which include diabetes mellitus type 2, lymphedema, hypertension,  congestive heart failure, and cardiac arrhythmia though is not sure that this is actually atrial fibrillation based on what he has been told. He does have lymphedema pumps but tells me he has not used them since the summer when he had to move to a remodel of his building and subsequently never unpacked them. 07-23-2022 upon evaluation today patient appears to be doing well with regard to his legs. He is going require some sharp debridement today but overall seems to be making some progress. I am pleased in that regard he has much less drainage that he had did last week I think the compression wraps are definitely helping. 07-30-2022 upon evaluation today patient appears to be doing poorly in regard to his legs he actually has blue-green drainage which I think is consistent with Pseudomonas. I believe that we need to address this as soon as possible and I discussed that with the patient today as well. Fortunately I do not see any signs of active infection locally nor systemically at this time which is great news. No fevers, chills, nausea, vomiting, or diarrhea. TRACI, PLEMONS ([id number]) [id number] Page 2 of 9 08-13-2022 upon evaluation today patient appears to be doing well currently in regard to his leg ulcers which are actually looking much better. Fortunately there does not appear to be any signs of active infection at this time which is great news. No fevers, chills, nausea, vomiting, or diarrhea. 3/7; patient with predominantly a left medial lower extremity wound as well as several smaller areas and a small area on the right lateral. His Jodie Echevaria came in today. We are using silver alginate as the primary dressing 09-03-2022 upon evaluation today patient appears to be doing well currently in regard to his wounds. He has been tolerating the dressing changes without complication. Fortunately there does not appear to be any signs of infection there is needed however for  sharp  debridement. 09-10-2022 upon evaluation today patient appears to be doing well currently in regard to his wound. Has been tolerating the dressing changes without complication. Fortunately there does not appear to be any signs of infection looking systemically which is great news and overall I am extremely pleased with where we stand today. I do not see any signs of infection. 09-17-2022 upon evaluation today patient actually is making signs of improvement the good news is he is making good improvement. With that being said he still is having quite a time keeping the swelling down. We have been using Tubigrip I think that still probably our best option but nonetheless I think that we are making progress with regard to his wounds. 09-24-2022 upon evaluation today patient appears to be doing well currently in regard to his wounds is continuing to make improvements at this point which is great news and overall I am extremely pleased with where we stand today. Fortunately I do not see any evidence of active infection at this time which is great news and in general I think he is doing quite well with the [facility] and silver alginate. 10-01-2022 upon evaluation today patient appears to be doing pretty well currently in regard to his wounds. He has been tolerating the dressing changes without complication. Fortunately I do not see any evidence of active infection locally nor systemically which is great news and I do believe that 10-08-2022 upon evaluation today patient's wounds actually are showing some signs of improvement which is great news. Still this is very slow to heal I really feel like he would do better if we can get him in some stronger compression wrapping he does have the juxta lite compression wraps therefore we can actually see about doing that over top of his Tubigrip to see if that can be beneficial. He does not love the hide he had as they are not the most comfortable thing for him but  nonetheless I think it would be beneficial he is in agreement with going forward with this. 10-15-2022 upon evaluation today patient appears to be doing well currently in regard to his wounds that do not appear to be infected which is good news. With that being said unfortunately he still continues to have significant amount of swelling we really need to do something to try to get the swelling under control. I do not see any signs of infection locally nor systemically but at the same time the amount of edema is tremendous. The juxta lite helps but again he is only taking the fluid pills once or twice a week due to the amount that makes him go to the restroom. Therefore he does not take this daily which is really what he needs on a more regular basis. We need to try to get some compression on and is a little bit stronger. 10-22-2022 upon evaluation today patient actually appears to be making some really good progress in regard to his wound. The compression wrap was put on last time actually doing a great job he looks much improved and in general I feel like that we are making great progress. I do not see any evidence of active infection locally nor systemically which is great news. 10-29-2022 upon evaluation today patient appears to be doing decently well in regard to his legs currently. I feel like that his swelling is much better I feel like that he is also doing much better in regard to the wounds in general. I do not see any  signs of active infection which is great news and in general I think that he is making excellent progress towards complete resolution. The patient does seem to have 1 issue that is with his wraps actually slipping down working I definitely need to work on that aspect. 11-05-2022 upon evaluation today patient appears to be doing excellent in regard to his wound. He has been tolerating the dressing changes without complication. He actually seems to be making excellent progress I am  extremely pleased with where things stand currently. I do believe that we are really making good progress and I think that we are on the right track towards complete closure I think the compression wrap has been a dramatic shift in a good way for him as far as getting the wounds healed a lot of these areas that we will continue to leave Completely sealed up. 11-12-2022 upon evaluation today patient appears to be doing well currently in regard to his leg ulcer. The compression wrapping seems to be doing an excellent job. Fortunately I do not see any evidence of active infection locally nor systemically which is great news and in general I do believe that we are making good headway here towards complete closure. 11-19-2022 upon evaluation today patient appears to be doing well currently in regard to his wounds. He is actually showing signs of excellent improvement and very pleased with where we stand I think that he is making great progress. I do not see any evidence of infection at this time which is great news. 11-26-2022 upon evaluation today patient appears to be doing well currently in regard to his wound. He is actually been tolerating the dressing changes without complication. Fortunately I do not see any evidence of active infection locally nor systemically which is great news I think he is making excellent progress here towards closure. 12-03-2022 upon evaluation today patient appears to be doing well currently in regard to his wound. He in fact at both locations is doing great and seems to be making excellent progress and very pleased in that regard. I do not see any signs of active infection at this time. 12-10-2022 upon evaluation patient's wound is actually significantly smaller with one of the satellite lesions closed and there is just 1 small area remaining and this is actually doing quite well. I am very pleased with her progress. 12-15-2022 upon evaluation today patient appears to be doing  actually pretty well in regard to his wound on the left leg. He does have a little area on the backside that open that was previously close last week due to the fact that his wrap got wet over the weekend and he had to make do with stuff they can find at the drugstore. They made this work out however and the good news is he actually seems to be doing significantly better and the wound looks fine except for the fact that he has a couple of areas that just reopen on the backside very tiny. Nonetheless I do believe that we can go ahead and see what we do about trying to get this moving in the right direction yet again. 12-22-2022 upon evaluation today patient appears to be doing poorly in regard to his wrap slid down and his leg is extremely swollen. It is also painful secondary to it being so swollen. Fortunately I do not see any evidence of infection right now but at the same time I do believe that if we do not get the swelling under control  is not really good to alleviate his discomfort. We definitely can need to change his wrap out however in short order. 12-31-2022 upon evaluation today patient appears to be doing well currently in regard to his wound. He has been tolerating the dressing changes without complication. This actually looks to be doing much better today compared to where we were. Fortunately I do not see any evidence of active infection locally or systemically which is great news. 01-11-2023 upon evaluation today patient appears to be doing well currently in regard to his wound which is actually showing signs of significant improvement. Fortunately I do not see any signs of infection locally or systemically which is great news and in general I do believe that we are making good headway towards complete closure. 01-19-2023 upon evaluation today patient appears to be doing well currently hemoglobin to his wound. He has been tolerating the dressing changes without complication. Fortunately the  wound is not infected unfortunately it is a little bigger due to the fact that the wrap slid down. I do not see any signs of worsening or infection at this time. 01-28-2023 upon evaluation today patient appears to be doing well currently in regard to his wound. He has been tolerating the dressing changes without complication. Fortunately I do not see any signs of infection the compression wrap is doing a really good job and I think it worked pretty much just about healed. [name], [name] ([id number]) [id number] Page 3 of 9 02-04-2023 upon evaluation today patient appears to be doing excellent in regard to his wounds. In fact he is pretty much completely healed on the left although I think this may have just a pinpoint opening. Nonetheless I think it is 1 more week to toughen up. 02-16-2023 upon evaluation today patient appears to be doing well currently in regard to his wound. He has been tolerating the dressing changes without complication. Fortunately there does not appear to be any evidence of active infection locally or systemically which is good news. In fact the original wounds we been taking care of on the left and right legs are completely healed unfortunately his wrap slipped down the right leg and there are 2 small areas on the anterior portion of his leg which are open at this point. When I wrapped his right leg the left leg is completely closed however he is in agreement to his own compression sock. 02-26-2023 upon evaluation today patient appears to be doing well currently in regard to his left leg which is still close in the right leg is doing significantly better. Fortunately there does not appear to be any signs of active infection locally or systemically which is great news and in general I do believe that we will making headway towards complete closure. 03-05-2023 upon evaluation today patient actually appears to be doing excellent in regard to his wounds one  of the areas healed the other is significantly improved. I am actually very pleased with where we stand today and I think that he is doing well with his compression stockings which is excellent as well. 03-19-2023 upon evaluation today patient appears to be doing poorly currently in regard to his wounds. He seems to be doing a bit worse as far as both legs are concerned as far as open wounds and I think that we are going need to address this rapidly to keep it from backtracking and getting significantly worse. He voiced understanding he kind of somewhat expected I believe. Electronic Signature(s) Signed: 03/19/2023 12:08:55  PM By: Allen Derry PA-C Entered By: Allen Derry on 03/19/2023 12:08:55 -------------------------------------------------------------------------------- Physical Exam Details Patient Name: Date of Service: [name] 03/19/2023 11:30 A M Medical Record Number: [id number] Patient Account Number: [id number] Date of Birth/Sex: Treating RN: May 21, 1956 (67 y.o. [name] Primary Care Provider: [name] Other Clinician: Referring Provider: Treating Provider/Extender: Matthew Folks in Treatment: 35 Constitutional Well-nourished and well-hydrated in no acute distress. Respiratory normal breathing without difficulty. Psychiatric this patient is able to make decisions and demonstrates good insight into disease process. Alert and Oriented x 3. pleasant and cooperative. Notes Upon inspection patient's wound bed actually showed signs of good granulation and epithelization at this point. Fortunately I do not see any signs of worsening I do believe that the patient is making good headway towards closure in some respects but another as he has new areas opening which I do not love. Electronic Signature(s) Signed: 03/19/2023 12:09:10 PM By: Allen Derry PA-C Entered By: Allen Derry on 03/19/2023 12:09:09 [name] ([id number])  [id number] Page 4 of 9 -------------------------------------------------------------------------------- Physician Orders Details Patient Name: Date of Service: [name] 03/19/2023 11:30 A M Medical Record Number: [id number] Patient Account Number: [id number] Date of Birth/Sex: Treating RN: [birth year] (67 y.o. [name] Primary Care Provider: [name] Other Clinician: Referring Provider: Treating Provider/Extender: Matthew Folks in Treatment: 31 Verbal / Phone Orders: No Diagnosis Coding ICD-10 Coding Code Description E11.622 Type 2 diabetes mellitus with other skin ulcer I89.0 Lymphedema, not elsewhere classified L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 L97.812 Non-pressure chronic ulcer of other part of right lower leg with fat layer exposed I10 Essential (primary) hypertension I50.42 Chronic combined systolic (congestive) and diastolic (congestive) heart failure I49.9 Cardiac arrhythmia, unspecified Follow-up Appointments Return Appointment in 1 week. Nurse Visit as needed Bathing/ Shower/ Hygiene May shower with wound dressing protected with water repellent cover or cast protector. Anesthetic (Use 'Patient Medications' Section for Anesthetic Order Entry) Lidocaine applied to wound bed Edema Control - Lymphedema / Segmental Compressive Device / Other Elevate, Exercise Daily and A void Standing for Long Periods of Time. Elevate legs to the level of the heart and pump ankles as often as possible Elevate leg(s) parallel to the floor when sitting. Compression Pump: Use compression pump on left lower extremity for 60 minutes, twice daily. Compression Pump: Use compression pump on right lower extremity for 60 minutes, twice daily. DO YOUR BEST to sleep in the bed at night. DO NOT sleep in your recliner. Long hours of sitting in a recliner leads to swelling of the  legs and/or potential wounds on your backside. Non-Wound Condition Left Lower Extremity pply appropriate compression. - Urgo lite large LLL A dditional non-wound orders/instructions: - xeroform and abd to right heel prior to wrapping A Wound Treatment Wound #3 - Lower Leg Wound Laterality: Right Cleanser: Soap and Water 2 x Per Week/30 Days Discharge Instructions: Gently cleanse wound with antibacterial soap, rinse and pat dry prior to dressing wounds Prim Dressing: Silvercel Small 2x2 (in/in) 2 x Per Week/30 Days ary Discharge Instructions: Apply Silvercel Small 2x2 (in/in) as instructed Secondary Dressing: ABD Pad 5x9 (in/in) 2 x Per Week/30 Days Discharge Instructions: Cover with ABD pad Compression Wrap: Urgo K2 Lite, two layer compression system, large 2 x Per Week/30 Days Electronic Signature(s) Signed: 03/19/2023 12:34:40 PM By: Angelina Pih Signed: 03/19/2023 2:08:40 PM By: Allen Derry PA-C Entered By: Roger Shelter,  Caitlin on 03/19/2023 12:32:47 [name], [name] ([id number]) [id number] Page 5 of 9 -------------------------------------------------------------------------------- Problem List Details Patient Name: Date of Service: [name] 03/19/2023 11:30 A M Medical Record Number: [id number] Patient Account Number: [id number] Date of Birth/Sex: Treating RN: April 02, 1956 (67 y.o. [name] Primary Care Provider: [name] Other Clinician: Referring Provider: Treating Provider/Extender: Matthew Folks in Treatment: 35 Active Problems ICD-10 Encounter Code Description Active Date MDM Diagnosis E11.622 Type 2 diabetes mellitus with other skin ulcer 07/16/2022 No Yes I89.0 Lymphedema, not elsewhere classified 07/16/2022 No Yes L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2/06/2022 No Yes L97.812 Non-pressure chronic ulcer of other part of right lower leg with fat layer  07/16/2022 No Yes exposed I10 Essential (primary) hypertension 07/16/2022 No Yes I50.42 Chronic combined systolic (congestive) and diastolic (congestive) heart failure 07/16/2022 No Yes I49.9 Cardiac arrhythmia, unspecified 07/16/2022 No Yes Inactive Problems Resolved Problems Electronic Signature(s) Signed: 03/19/2023 11:02:23 AM By: Allen Derry PA-C Entered By: Allen Derry on 03/19/2023 11:02:23 [name] ([id number]) [id number] Page 6 of 9 -------------------------------------------------------------------------------- Progress Note Details Patient Name: Date of Service: [name] 03/19/2023 11:30 A M Medical Record Number: [id number] Patient Account Number: [id number] Date of Birth/Sex: Treating RN: 1956/01/03 (67 y.o. [name] Primary Care Provider: [name] Other Clinician: Referring Provider: Treating Provider/Extender: Matthew Folks in Treatment: 35 Subjective Chief Complaint Information obtained from Patient Bilateral LE ulcers and lymphedema History of Present Illness (HPI) 07-16-2022 upon evaluation today patient appears to be doing poorly currently in regard to his his legs. The left leg is actually worse than the right. This is a patient that is previously been seen in the [place] office and at that point was doing really quite well with compression wrapping and often alginate are either Hydrofera Blue dressings. Fortunately he has gone since November 2020 until just current with out having any issue or need for wound care services which is great news. Unfortunately he is here today because he is having some issues at this point. Patient does have a history of several medical conditions which include diabetes mellitus type 2, lymphedema, hypertension, congestive heart failure, and cardiac arrhythmia though is not sure that this is actually atrial fibrillation based on what he has been told. He does have  lymphedema pumps but tells me he has not used them since the summer when he had to move to a remodel of his building and subsequently never unpacked them. 07-23-2022 upon evaluation today patient appears to be doing well with regard to his legs. He is going require some sharp debridement today but overall seems to be making some progress. I am pleased in that regard he has much less drainage that he had did last week I think the compression wraps are definitely helping. 07-30-2022 upon evaluation today patient appears to be doing poorly in regard to his legs he actually has blue-green drainage which I think is consistent with Pseudomonas. I believe that we need to address this as soon as possible and I discussed that with the patient today as well. Fortunately I do not see any signs of active infection locally nor systemically at this time which is great news. No fevers, chills, nausea, vomiting, or diarrhea. 08-13-2022 upon evaluation today patient appears to be doing well currently in regard to his leg ulcers which are actually looking much better. Fortunately there does not appear to be any signs  of active infection at this time which is great news. No fevers, chills, nausea, vomiting, or diarrhea. 3/7; patient with predominantly a left medial lower extremity wound as well as several smaller areas and a small area on the right lateral. His Jodie Echevaria came in today. We are using silver alginate as the primary dressing 09-03-2022 upon evaluation today patient appears to be doing well currently in regard to his wounds. He has been tolerating the dressing changes without complication. Fortunately there does not appear to be any signs of infection there is needed however for sharp debridement. 09-10-2022 upon evaluation today patient appears to be doing well currently in regard to his wound. Has been tolerating the dressing changes without complication. Fortunately there does not appear to be any signs of  infection looking systemically which is great news and overall I am extremely pleased with where we stand today. I do not see any signs of infection. 09-17-2022 upon evaluation today patient actually is making signs of improvement the good news is he is making good improvement. With that being said he still is having quite a time keeping the swelling down. We have been using Tubigrip I think that still probably our best option but nonetheless I think that we are making progress with regard to his wounds. 09-24-2022 upon evaluation today patient appears to be doing well currently in regard to his wounds is continuing to make improvements at this point which is great news and overall I am extremely pleased with where we stand today. Fortunately I do not see any evidence of active infection at this time which is great news and in general I think he is doing quite well with the [facility] and silver alginate. 10-01-2022 upon evaluation today patient appears to be doing pretty well currently in regard to his wounds. He has been tolerating the dressing changes without complication. Fortunately I do not see any evidence of active infection locally nor systemically which is great news and I do believe that 10-08-2022 upon evaluation today patient's wounds actually are showing some signs of improvement which is great news. Still this is very slow to heal I really feel like he would do better if we can get him in some stronger compression wrapping he does have the juxta lite compression wraps therefore we can actually see about doing that over top of his Tubigrip to see if that can be beneficial. He does not love the hide he had as they are not the most comfortable thing for him but nonetheless I think it would be beneficial he is in agreement with going forward with this. 10-15-2022 upon evaluation today patient appears to be doing well currently in regard to his wounds that do not appear to be infected which is good  news. With that being said unfortunately he still continues to have significant amount of swelling we really need to do something to try to get the swelling under control. I do not see any signs of infection locally nor systemically but at the same time the amount of edema is tremendous. The juxta lite helps but again he is only taking the fluid pills once or twice a week due to the amount that makes him go to the restroom. Therefore he does not take this daily which is really what he needs on a more regular basis. We need to try to get some compression on and is a little bit stronger. 10-22-2022 upon evaluation today patient actually appears to be making some really good progress  in regard to his wound. The compression wrap was put on last time actually doing a great job he looks much improved and in general I feel like that we are making great progress. I do not see any evidence of active infection locally nor systemically which is great news. 10-29-2022 upon evaluation today patient appears to be doing decently well in regard to his legs currently. I feel like that his swelling is much better I feel like that he is also doing much better in regard to the wounds in general. I do not see any signs of active infection which is great news and in general I think that he [name], [name] ([id number]) [id number] Page 7 of 9 is making excellent progress towards complete resolution. The patient does seem to have 1 issue that is with his wraps actually slipping down working I definitely need to work on that aspect. 11-05-2022 upon evaluation today patient appears to be doing excellent in regard to his wound. He has been tolerating the dressing changes without complication. He actually seems to be making excellent progress I am extremely pleased with where things stand currently. I do believe that we are really making good progress and I think that we are on the right track towards  complete closure I think the compression wrap has been a dramatic shift in a good way for him as far as getting the wounds healed a lot of these areas that we will continue to leave Completely sealed up. 11-12-2022 upon evaluation today patient appears to be doing well currently in regard to his leg ulcer. The compression wrapping seems to be doing an excellent job. Fortunately I do not see any evidence of active infection locally nor systemically which is great news and in general I do believe that we are making good headway here towards complete closure. 11-19-2022 upon evaluation today patient appears to be doing well currently in regard to his wounds. He is actually showing signs of excellent improvement and very pleased with where we stand I think that he is making great progress. I do not see any evidence of infection at this time which is great news. 11-26-2022 upon evaluation today patient appears to be doing well currently in regard to his wound. He is actually been tolerating the dressing changes without complication. Fortunately I do not see any evidence of active infection locally nor systemically which is great news I think he is making excellent progress here towards closure. 12-03-2022 upon evaluation today patient appears to be doing well currently in regard to his wound. He in fact at both locations is doing great and seems to be making excellent progress and very pleased in that regard. I do not see any signs of active infection at this time. 12-10-2022 upon evaluation patient's wound is actually significantly smaller with one of the satellite lesions closed and there is just 1 small area remaining and this is actually doing quite well. I am very pleased with her progress. 12-15-2022 upon evaluation today patient appears to be doing actually pretty well in regard to his wound on the left leg. He does have a little area on the backside that open that was previously close last week due to the  fact that his wrap got wet over the weekend and he had to make do with stuff they can find at the drugstore. They made this work out however and the good news is he actually seems to be doing significantly better and the wound looks fine  except for the fact that he has a couple of areas that just reopen on the backside very tiny. Nonetheless I do believe that we can go ahead and see what we do about trying to get this moving in the right direction yet again. 12-22-2022 upon evaluation today patient appears to be doing poorly in regard to his wrap slid down and his leg is extremely swollen. It is also painful secondary to it being so swollen. Fortunately I do not see any evidence of infection right now but at the same time I do believe that if we do not get the swelling under control is not really good to alleviate his discomfort. We definitely can need to change his wrap out however in short order. 12-31-2022 upon evaluation today patient appears to be doing well currently in regard to his wound. He has been tolerating the dressing changes without complication. This actually looks to be doing much better today compared to where we were. Fortunately I do not see any evidence of active infection locally or systemically which is great news. 01-11-2023 upon evaluation today patient appears to be doing well currently in regard to his wound which is actually showing signs of significant improvement. Fortunately I do not see any signs of infection locally or systemically which is great news and in general I do believe that we are making good headway towards complete closure. 01-19-2023 upon evaluation today patient appears to be doing well currently hemoglobin to his wound. He has been tolerating the dressing changes without complication. Fortunately the wound is not infected unfortunately it is a little bigger due to the fact that the wrap slid down. I do not see any signs of worsening or infection at this  time. 01-28-2023 upon evaluation today patient appears to be doing well currently in regard to his wound. He has been tolerating the dressing changes without complication. Fortunately I do not see any signs of infection the compression wrap is doing a really good job and I think it worked pretty much just about healed. 02-04-2023 upon evaluation today patient appears to be doing excellent in regard to his wounds. In fact he is pretty much completely healed on the left although I think this may have just a pinpoint opening. Nonetheless I think it is 1 more week to toughen up. 02-16-2023 upon evaluation today patient appears to be doing well currently in regard to his wound. He has been tolerating the dressing changes without complication. Fortunately there does not appear to be any evidence of active infection locally or systemically which is good news. In fact the original wounds we been taking care of on the left and right legs are completely healed unfortunately his wrap slipped down the right leg and there are 2 small areas on the anterior portion of his leg which are open at this point. When I wrapped his right leg the left leg is completely closed however he is in agreement to his own compression sock. 02-26-2023 upon evaluation today patient appears to be doing well currently in regard to his left leg which is still close in the right leg is doing significantly better. Fortunately there does not appear to be any signs of active infection locally or systemically which is great news and in general I do believe that we will making headway towards complete closure. 03-05-2023 upon evaluation today patient actually appears to be doing excellent in regard to his wounds one of the areas healed the other is significantly improved. I am  actually very pleased with where we stand today and I think that he is doing well with his compression stockings which is excellent as well. 03-19-2023 upon evaluation today  patient appears to be doing poorly currently in regard to his wounds. He seems to be doing a bit worse as far as both legs are concerned as far as open wounds and I think that we are going need to address this rapidly to keep it from backtracking and getting significantly worse. He voiced understanding he kind of somewhat expected I believe. Objective Constitutional Well-nourished and well-hydrated in no acute distress. Vitals Time Taken: 11:03 AM, Height: 73 in, Weight: 358 lbs, BMI: 47.2, Temperature: 98.1 F, Pulse: 84 bpm, Respiratory Rate: 18 breaths/min, Blood Pressure: 107/66 mmHg. Respiratory normal breathing without difficulty. [name], [name] ([id number]) [id number] Page 8 of 9 Psychiatric this patient is able to make decisions and demonstrates good insight into disease process. Alert and Oriented x 3. pleasant and cooperative. General Notes: Upon inspection patient's wound bed actually showed signs of good granulation and epithelization at this point. Fortunately I do not see any signs of worsening I do believe that the patient is making good headway towards closure in some respects but another as he has new areas opening which I do not love. Integumentary (Hair, Skin) Wound #3 status is Open. Original cause of wound was Gradually Appeared. The date acquired was: 02/04/2023. The wound has been in treatment 6 weeks. The wound is located on the Right Lower Leg. The wound measures 0.7cm length x 0.9cm width x 0.1cm depth; 0.495cm^2 area and 0.049cm^3 volume. There is Fat Layer (Subcutaneous Tissue) exposed. There is no tunneling or undermining noted. There is a medium amount of serosanguineous drainage noted. There is medium (34-66%) red granulation within the wound bed. There is a medium (34-66%) amount of necrotic tissue within the wound bed including Adherent Slough. Other Condition(s) Patient presents with Lymphedema located on the Left Leg. General  Notes: swelling noted Assessment Active Problems ICD-10 Type 2 diabetes mellitus with other skin ulcer Lymphedema, not elsewhere classified Non-pressure chronic ulcer of other part of left lower leg with fat layer exposed Non-pressure chronic ulcer of other part of right lower leg with fat layer exposed Essential (primary) hypertension Chronic combined systolic (congestive) and diastolic (congestive) heart failure Cardiac arrhythmia, unspecified Procedures Wound #3 Pre-procedure diagnosis of Wound #3 is a Lymphedema located on the Right Lower Leg . There was a Double Layer Compression Therapy Procedure by Angelina Pih, RN. Post procedure Diagnosis Wound #3: Same as Pre-Procedure There was a Double Layer Compression Therapy Procedure by Angelina Pih, RN. Post procedure Diagnosis Wound #: Same as Pre-Procedure Plan 1. Would recommend that we have the patient continue to monitor for any signs of infection or worsening. Based on what I am seeing I do believe that we are doing well but we do need to make sure that he does not show signs of worsening and right now we are seeing new wounds I do not like that prospect. For that reason we will going go ahead and rewrap both legs to get this under control. 2. I am going to recommend as well that we should use the silver cell which I think is doing a good job. 3. I am going to recommend that he should continue to try to elevate his legs much as possible and more this he can do the better. We will see patient back for reevaluation in 1 week here in the clinic. If  anything worsens or changes patient will contact our office for additional recommendations. Electronic Signature(s) Signed: 03/19/2023 12:10:16 PM By: Allen Derry PA-C Entered By: Allen Derry on 03/19/2023 12:10:15 [name] ([id number]) [id number] Page 9 of 9 -------------------------------------------------------------------------------- SuperBill  Details Patient Name: Date of Service: [name] 03/19/2023 Medical Record Number: [id number] Patient Account Number: [id number] Date of Birth/Sex: Treating RN: Aug 05, 1955 (67 y.o. [name] Primary Care Provider: [name] Other Clinician: Referring Provider: Treating Provider/Extender: Matthew Folks in Treatment: 35 Diagnosis Coding ICD-10 Codes Code Description E11.622 Type 2 diabetes mellitus with other skin ulcer I89.0 Lymphedema, not elsewhere classified L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 L97.812 Non-pressure chronic ulcer of other part of right lower leg with fat layer exposed I10 Essential (primary) hypertension I50.42 Chronic combined systolic (congestive) and diastolic (congestive) heart failure I49.9 Cardiac arrhythmia, unspecified Facility Procedures : CPT4: Code [id number] Description: 81 BILATERAL: Application of multi-layer venous compression system; leg (below knee), including ankle and t. Modifier: Quantity: 1 Physician Procedures : CPT4 Code Description Modifier [id number] - WC PHYS LEVEL 3 - EST PT ICD-10 Diagnosis Description E11.622 Type 2 diabetes mellitus with other skin ulcer I89.0 Lymphedema, not elsewhere classified L97.822 Non-pressure chronic ulcer of other part of  left lower leg with fat layer exposed L97.812 Non-pressure chronic ulcer of other part of right lower leg with fat layer exposed Quantity: 1 Electronic Signature(s) Signed: 03/19/2023 12:33:06 PM By: Angelina Pih Signed: 03/19/2023 2:08:40 PM By: Allen Derry PA-C Previous Signature: 03/19/2023 12:10:28 PM Version By: Allen Derry PA-C Entered By: Angelina Pih on 03/19/2023 12:33:05

## 2023-03-23 ENCOUNTER — Ambulatory Visit: Payer: PPO

## 2023-03-26 ENCOUNTER — Ambulatory Visit: Payer: PPO | Admitting: Physician Assistant

## 2023-03-29 ENCOUNTER — Encounter: Payer: PPO | Admitting: Physician Assistant

## 2023-03-29 DIAGNOSIS — I890 Lymphedema, not elsewhere classified: Secondary | ICD-10-CM | POA: Diagnosis not present

## 2023-03-29 DIAGNOSIS — L97812 Non-pressure chronic ulcer of other part of right lower leg with fat layer exposed: Secondary | ICD-10-CM | POA: Diagnosis not present

## 2023-03-29 DIAGNOSIS — E11622 Type 2 diabetes mellitus with other skin ulcer: Secondary | ICD-10-CM | POA: Diagnosis not present

## 2023-03-29 NOTE — Progress Notes (Addendum)
VASH, PREVOT ([id number]) [id number] Page 1 of 9 Visit Report for 03/29/2023 Chief Complaint Document Details Patient Name: Date of Service: [name] 03/29/2023 8:45 A M Medical Record Number: [id number] Patient Account Number: [id number] Date of Birth/Sex: Treating RN: 13-Oct-1955 (66 y.o. [name]) [name] Primary Care Provider: [name] Other Clinician: Referring Provider: Treating Provider/Extender: [name] in Treatment: 36 Information Obtained from: Patient Chief Complaint Bilateral LE ulcers and lymphedema Electronic Signature(s) Signed: 03/29/2023 8:38:22 AM By: Matthew Derry PA-C Entered By: [name] on 03/29/2023 05:38:21 -------------------------------------------------------------------------------- HPI Details Patient Name: Date of Service: [name], Matthew [name] 03/29/2023 8:45 A M Medical Record Number: [id number] Patient Account Number: [id number] Date of Birth/Sex: Treating RN: 08/23/1955 (66 y.o. [name] Primary Care Provider: [name] Other Clinician: Referring Provider: Treating Provider/Extender: [name] in Treatment: 36 History of Present Illness HPI Description: 07-16-2022 upon evaluation today patient appears to be doing poorly currently in regard to his his legs. The left leg is actually worse than the right. This is a patient that is previously been seen in the [facility] office and at that point was doing really quite well with compression wrapping and often alginate are either Hydrofera Blue dressings. Fortunately he has gone since November 2020 until just current with out having any issue or need for wound care services which is great news. Unfortunately he is here today because he is having some issues at this point. Patient does have a history of several medical conditions which include diabetes mellitus type 2, lymphedema, hypertension, congestive  heart failure, and cardiac arrhythmia though is not sure that this is actually atrial fibrillation based on what he has been told. He does have lymphedema pumps but tells me he has not used them since the summer when he had to move to a remodel of his building and subsequently never unpacked them. 07-23-2022 upon evaluation today patient appears to be doing well with regard to his legs. He is going require some sharp debridement today but overall seems to be making some progress. I am pleased in that regard he has much less drainage that he had did last week I think the compression wraps are definitely helping. 07-30-2022 upon evaluation today patient appears to be doing poorly in regard to his legs he actually has blue-green drainage which I think is consistent with Pseudomonas. I believe that we need to address this as soon as possible and I discussed that with the patient today as well. Fortunately I do not see any signs of active infection locally nor systemically at this time which is great news. No fevers, chills, nausea, vomiting, or diarrhea. NAI, MATEL ([id number]) [id number] Page 2 of 9 08-13-2022 upon evaluation today patient appears to be doing well currently in regard to his leg ulcers which are actually looking much better. Fortunately there does not appear to be any signs of active infection at this time which is great news. No fevers, chills, nausea, vomiting, or diarrhea. 3/7; patient with predominantly a left medial lower extremity wound as well as several smaller areas and a small area on the right lateral. His [name] came in today. We are using silver alginate as the primary dressing 09-03-2022 upon evaluation today patient appears to be doing well currently in regard to his wounds. He has been tolerating the dressing changes without complication. Fortunately there does not appear to be any signs of infection there is needed however for sharp  debridement. 09-10-2022 upon evaluation today patient appears to be doing well currently in regard to his wound. Has been tolerating the dressing changes without complication. Fortunately there does not appear to be any signs of infection looking systemically which is great news and overall I am extremely pleased with where we stand today. I do not see any signs of infection. 09-17-2022 upon evaluation today patient actually is making signs of improvement the good news is he is making good improvement. With that being said he still is having quite a time keeping the swelling down. We have been using Tubigrip I think that still probably our best option but nonetheless I think that we are making progress with regard to his wounds. 09-24-2022 upon evaluation today patient appears to be doing well currently in regard to his wounds is continuing to make improvements at this point which is great news and overall I am extremely pleased with where we stand today. Fortunately I do not see any evidence of active infection at this time which is great news and in general I think he is doing quite well with the [facility] and silver alginate. 10-01-2022 upon evaluation today patient appears to be doing pretty well currently in regard to his wounds. He has been tolerating the dressing changes without complication. Fortunately I do not see any evidence of active infection locally nor systemically which is great news and I do believe that 10-08-2022 upon evaluation today patient's wounds actually are showing some signs of improvement which is great news. Still this is very slow to heal I really feel like he would do better if we can get him in some stronger compression wrapping he does have the juxta lite compression wraps therefore we can actually see about doing that over top of his Tubigrip to see if that can be beneficial. He does not love the hide he had as they are not the most comfortable thing for him but nonetheless  I think it would be beneficial he is in agreement with going forward with this. 10-15-2022 upon evaluation today patient appears to be doing well currently in regard to his wounds that do not appear to be infected which is good news. With that being said unfortunately he still continues to have significant amount of swelling we really need to do something to try to get the swelling under control. I do not see any signs of infection locally nor systemically but at the same time the amount of edema is tremendous. The juxta lite helps but again he is only taking the fluid pills once or twice a week due to the amount that makes him go to the restroom. Therefore he does not take this daily which is really what he needs on a more regular basis. We need to try to get some compression on and is a little bit stronger. 10-22-2022 upon evaluation today patient actually appears to be making some really good progress in regard to his wound. The compression wrap was put on last time actually doing a great job he looks much improved and in general I feel like that we are making great progress. I do not see any evidence of active infection locally nor systemically which is great news. 10-29-2022 upon evaluation today patient appears to be doing decently well in regard to his legs currently. I feel like that his swelling is much better I feel like that he is also doing much better in regard to the wounds in general. I do not see any  signs of active infection which is great news and in general I think that he is making excellent progress towards complete resolution. The patient does seem to have 1 issue that is with his wraps actually slipping down working I definitely need to work on that aspect. 11-05-2022 upon evaluation today patient appears to be doing excellent in regard to his wound. He has been tolerating the dressing changes without complication. He actually seems to be making excellent progress I am extremely  pleased with where things stand currently. I do believe that we are really making good progress and I think that we are on the right track towards complete closure I think the compression wrap has been a dramatic shift in a good way for him as far as getting the wounds healed a lot of these areas that we will continue to leave Completely sealed up. 11-12-2022 upon evaluation today patient appears to be doing well currently in regard to his leg ulcer. The compression wrapping seems to be doing an excellent job. Fortunately I do not see any evidence of active infection locally nor systemically which is great news and in general I do believe that we are making good headway here towards complete closure. 11-19-2022 upon evaluation today patient appears to be doing well currently in regard to his wounds. He is actually showing signs of excellent improvement and very pleased with where we stand I think that he is making great progress. I do not see any evidence of infection at this time which is great news. 11-26-2022 upon evaluation today patient appears to be doing well currently in regard to his wound. He is actually been tolerating the dressing changes without complication. Fortunately I do not see any evidence of active infection locally nor systemically which is great news I think he is making excellent progress here towards closure. 12-03-2022 upon evaluation today patient appears to be doing well currently in regard to his wound. He in fact at both locations is doing great and seems to be making excellent progress and very pleased in that regard. I do not see any signs of active infection at this time. 12-10-2022 upon evaluation patient's wound is actually significantly smaller with one of the satellite lesions closed and there is just 1 small area remaining and this is actually doing quite well. I am very pleased with her progress. 12-15-2022 upon evaluation today patient appears to be doing actually  pretty well in regard to his wound on the left leg. He does have a little area on the backside that open that was previously close last week due to the fact that his wrap got wet over the weekend and he had to make do with stuff they can find at the drugstore. They made this work out however and the good news is he actually seems to be doing significantly better and the wound looks fine except for the fact that he has a couple of areas that just reopen on the backside very tiny. Nonetheless I do believe that we can go ahead and see what we do about trying to get this moving in the right direction yet again. 12-22-2022 upon evaluation today patient appears to be doing poorly in regard to his wrap slid down and his leg is extremely swollen. It is also painful secondary to it being so swollen. Fortunately I do not see any evidence of infection right now but at the same time I do believe that if we do not get the swelling under control  is not really good to alleviate his discomfort. We definitely can need to change his wrap out however in short order. 12-31-2022 upon evaluation today patient appears to be doing well currently in regard to his wound. He has been tolerating the dressing changes without complication. This actually looks to be doing much better today compared to where we were. Fortunately I do not see any evidence of active infection locally or systemically which is great news. 01-11-2023 upon evaluation today patient appears to be doing well currently in regard to his wound which is actually showing signs of significant improvement. Fortunately I do not see any signs of infection locally or systemically which is great news and in general I do believe that we are making good headway towards complete closure. 01-19-2023 upon evaluation today patient appears to be doing well currently hemoglobin to his wound. He has been tolerating the dressing changes without complication. Fortunately the wound is  not infected unfortunately it is a little bigger due to the fact that the wrap slid down. I do not see any signs of worsening or infection at this time. 01-28-2023 upon evaluation today patient appears to be doing well currently in regard to his wound. He has been tolerating the dressing changes without complication. Fortunately I do not see any signs of infection the compression wrap is doing a really good job and I think it worked pretty much just about healed. [name], [name] ([id number]) [id number] Page 3 of 9 02-04-2023 upon evaluation today patient appears to be doing excellent in regard to his wounds. In fact he is pretty much completely healed on the left although I think this may have just a pinpoint opening. Nonetheless I think it is 1 more week to toughen up. 02-16-2023 upon evaluation today patient appears to be doing well currently in regard to his wound. He has been tolerating the dressing changes without complication. Fortunately there does not appear to be any evidence of active infection locally or systemically which is good news. In fact the original wounds we been taking care of on the left and right legs are completely healed unfortunately his wrap slipped down the right leg and there are 2 small areas on the anterior portion of his leg which are open at this point. When I wrapped his right leg the left leg is completely closed however he is in agreement to his own compression sock. 02-26-2023 upon evaluation today patient appears to be doing well currently in regard to his left leg which is still close in the right leg is doing significantly better. Fortunately there does not appear to be any signs of active infection locally or systemically which is great news and in general I do believe that we will making headway towards complete closure. 03-05-2023 upon evaluation today patient actually appears to be doing excellent in regard to his wounds one of the  areas healed the other is significantly improved. I am actually very pleased with where we stand today and I think that he is doing well with his compression stockings which is excellent as well. 03-19-2023 upon evaluation today patient appears to be doing poorly currently in regard to his wounds. He seems to be doing a bit worse as far as both legs are concerned as far as open wounds and I think that we are going need to address this rapidly to keep it from backtracking and getting significantly worse. He voiced understanding he kind of somewhat expected I believe. 03-29-2023 upon evaluation patient actually  showing signs of definite improvement with regard to his wounds currently with some already drying up and on the right lateral leg this is much smaller. Will continue to wrap him this seems to be doing quite well. Electronic Signature(s) Signed: 04/02/2023 11:14:46 AM By: Matthew Derry PA-C Previous Signature: 04/02/2023 11:14:25 AM Version By: Matthew Derry PA-C Entered By: [name] on 04/02/2023 08:14:46 -------------------------------------------------------------------------------- Physical Exam Details Patient Name: Date of Service: Matthew Burly [name] 03/29/2023 8:45 A M Medical Record Number: [id number] Patient Account Number: [id number] Date of Birth/Sex: Treating RN: 22-Aug-1955 (66 y.o. [name] Primary Care Provider: [name] Other Clinician: Referring Provider: Treating Provider/Extender: [name] in Treatment: 61 Constitutional Well-nourished and well-hydrated in no acute distress. Respiratory normal breathing without difficulty. Psychiatric this patient is able to make decisions and demonstrates good insight into disease process. Alert and Oriented x 3. pleasant and cooperative. Notes Upon inspection patient's wound bed showed signs of good granulation and epithelization at this point. Fortunately I do not see any evidence of active  infection locally or systemically which is great news and in general I do believe that he is making good headway towards complete closure which is great news. We can continue with the wrapping. Electronic Signature(s) Signed: 04/02/2023 11:15:00 AM By: Matthew Derry PA-C Entered By: [name] on 04/02/2023 08:14:59 [name] ([id number]) [id number] Page 4 of 9 -------------------------------------------------------------------------------- Physician Orders Details Patient Name: Date of Service: [name] 03/29/2023 8:45 A M Medical Record Number: [id number] Patient Account Number: [id number] Date of Birth/Sex: Treating RN: 10/06/1955 (66 y.o. [name]) [name] Primary Care Provider: [name] Other Clinician: Referring Provider: Treating Provider/Extender: [name] in Treatment: 44 Verbal / Phone Orders: No Diagnosis Coding ICD-10 Coding Code Description E11.622 Type 2 diabetes mellitus with other skin ulcer I89.0 Lymphedema, not elsewhere classified L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 L97.812 Non-pressure chronic ulcer of other part of right lower leg with fat layer exposed I10 Essential (primary) hypertension I50.42 Chronic combined systolic (congestive) and diastolic (congestive) heart failure I49.9 Cardiac arrhythmia, unspecified Follow-up Appointments Return Appointment in 1 week. Nurse Visit as needed Bathing/ Shower/ Hygiene May shower with wound dressing protected with water repellent cover or cast protector. Anesthetic (Use 'Patient Medications' Section for Anesthetic Order Entry) Lidocaine applied to wound bed Edema Control - Lymphedema / Segmental Compressive Device / Other UrgoK2 LITE - LLL Elevate, Exercise Daily and A void Standing for Long Periods of Time. Elevate legs to the level of the heart and pump ankles as often as  possible Elevate leg(s) parallel to the floor when sitting. Compression Pump: Use compression pump on left lower extremity for 60 minutes, twice daily. Compression Pump: Use compression pump on right lower extremity for 60 minutes, twice daily. DO YOUR BEST to sleep in the bed at night. DO NOT sleep in your recliner. Long hours of sitting in a recliner leads to swelling of the legs and/or potential wounds on your backside. Non-Wound Condition Left Lower Extremity dditional non-wound orders/instructions: - xeroform and abd to right heel prior to wrapping A Wound Treatment Wound #3 - Lower Leg Wound Laterality: Right Cleanser: Soap and Water 1 x Per Day/30 Days Discharge Instructions: Gently cleanse wound with antibacterial soap, rinse and pat dry prior to dressing wounds Prim Dressing: Silvercel Small 2x2 (in/in) 1 x Per Day/30 Days ary Discharge Instructions: Apply Silvercel Small 2x2 (in/in) as  instructed Secondary Dressing: ABD Pad 5x9 (in/in) 1 x Per Day/30 Days Discharge Instructions: Cover with ABD pad Compression Wrap: Urgo K2 Lite, two layer compression system, large 1 x Per Day/30 Days Electronic Signature(s) Signed: 03/29/2023 4:48:00 PM By: Matthew Derry PA-C Signed: 04/02/2023 11:41:18 AM By: Matthew Pax RN DOMANIK, [name] ([id number]) AM By: Matthew Pax RN [phone].pdf Page 5 of 9 Signed: 04/02/2023 11:41:18 Entered By: [name] on 03/29/2023 06:59:51 -------------------------------------------------------------------------------- Problem List Details Patient Name: Date of Service: [name] 03/29/2023 8:45 A M Medical Record Number: [id number] Patient Account Number: [id number] Date of Birth/Sex: Treating RN: 1955-08-17 (66 y.o. [name]) [name] Primary Care Provider: [name] Other Clinician: Referring Provider: Treating Provider/Extender: [name] in Treatment: 36 Active  Problems ICD-10 Encounter Code Description Active Date MDM Diagnosis E11.622 Type 2 diabetes mellitus with other skin ulcer 07/16/2022 No Yes I89.0 Lymphedema, not elsewhere classified 07/16/2022 No Yes L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2/06/2022 No Yes L97.812 Non-pressure chronic ulcer of other part of right lower leg with fat layer 07/16/2022 No Yes exposed I10 Essential (primary) hypertension 07/16/2022 No Yes I50.42 Chronic combined systolic (congestive) and diastolic (congestive) heart failure 07/16/2022 No Yes I49.9 Cardiac arrhythmia, unspecified 07/16/2022 No Yes Inactive Problems Resolved Problems Electronic Signature(s) Signed: 03/29/2023 8:38:18 AM By: Matthew Derry PA-C Entered By: [name] on 03/29/2023 05:38:18 [name] ([id number]) [id number] Page 6 of 9 -------------------------------------------------------------------------------- Progress Note Details Patient Name: Date of Service: [name] 03/29/2023 8:45 A M Medical Record Number: [id number] Patient Account Number: [id number] Date of Birth/Sex: Treating RN: 07-05-1955 (66 y.o. [name]) [name] Primary Care Provider: [name] Other Clinician: Referring Provider: Treating Provider/Extender: [name] in Treatment: 36 Subjective Chief Complaint Information obtained from Patient Bilateral LE ulcers and lymphedema History of Present Illness (HPI) 07-16-2022 upon evaluation today patient appears to be doing poorly currently in regard to his his legs. The left leg is actually worse than the right. This is a patient that is previously been seen in the [place] office and at that point was doing really quite well with compression wrapping and often alginate are either Hydrofera Blue dressings. Fortunately he has gone since November 2020 until just current with out having any issue or need for wound  care services which is great news. Unfortunately he is here today because he is having some issues at this point. Patient does have a history of several medical conditions which include diabetes mellitus type 2, lymphedema, hypertension, congestive heart failure, and cardiac arrhythmia though is not sure that this is actually atrial fibrillation based on what he has been told. He does have lymphedema pumps but tells me he has not used them since the summer when he had to move to a remodel of his building and subsequently never unpacked them. 07-23-2022 upon evaluation today patient appears to be doing well with regard to his legs. He is going require some sharp debridement today but overall seems to be making some progress. I am pleased in that regard he has much less drainage that he had did last week I think the compression wraps are definitely helping. 07-30-2022 upon evaluation today patient appears to be doing poorly in regard to his legs he actually has blue-green drainage which I think is consistent with Pseudomonas. I believe that we need to address this as soon as possible and I discussed that with the patient  today as well. Fortunately I do not see any signs of active infection locally nor systemically at this time which is great news. No fevers, chills, nausea, vomiting, or diarrhea. 08-13-2022 upon evaluation today patient appears to be doing well currently in regard to his leg ulcers which are actually looking much better. Fortunately there does not appear to be any signs of active infection at this time which is great news. No fevers, chills, nausea, vomiting, or diarrhea. 3/7; patient with predominantly a left medial lower extremity wound as well as several smaller areas and a small area on the right lateral. His [name] came in today. We are using silver alginate as the primary dressing 09-03-2022 upon evaluation today patient appears to be doing well currently in regard to his wounds. He  has been tolerating the dressing changes without complication. Fortunately there does not appear to be any signs of infection there is needed however for sharp debridement. 09-10-2022 upon evaluation today patient appears to be doing well currently in regard to his wound. Has been tolerating the dressing changes without complication. Fortunately there does not appear to be any signs of infection looking systemically which is great news and overall I am extremely pleased with where we stand today. I do not see any signs of infection. 09-17-2022 upon evaluation today patient actually is making signs of improvement the good news is he is making good improvement. With that being said he still is having quite a time keeping the swelling down. We have been using Tubigrip I think that still probably our best option but nonetheless I think that we are making progress with regard to his wounds. 09-24-2022 upon evaluation today patient appears to be doing well currently in regard to his wounds is continuing to make improvements at this point which is great news and overall I am extremely pleased with where we stand today. Fortunately I do not see any evidence of active infection at this time which is great news and in general I think he is doing quite well with the [facility] and silver alginate. 10-01-2022 upon evaluation today patient appears to be doing pretty well currently in regard to his wounds. He has been tolerating the dressing changes without complication. Fortunately I do not see any evidence of active infection locally nor systemically which is great news and I do believe that 10-08-2022 upon evaluation today patient's wounds actually are showing some signs of improvement which is great news. Still this is very slow to heal I really feel like he would do better if we can get him in some stronger compression wrapping he does have the juxta lite compression wraps therefore we can actually see about doing  that over top of his Tubigrip to see if that can be beneficial. He does not love the hide he had as they are not the most comfortable thing for him but nonetheless I think it would be beneficial he is in agreement with going forward with this. 10-15-2022 upon evaluation today patient appears to be doing well currently in regard to his wounds that do not appear to be infected which is good news. With that being said unfortunately he still continues to have significant amount of swelling we really need to do something to try to get the swelling under control. I do not see any signs of infection locally nor systemically but at the same time the amount of edema is tremendous. The juxta lite helps but again he is only taking the fluid pills once  or twice a week due to the amount that makes him go to the restroom. Therefore he does not take this daily which is really what he needs on a more regular basis. We need to try to get some compression on and is a little bit stronger. 10-22-2022 upon evaluation today patient actually appears to be making some really good progress in regard to his wound. The compression wrap was put on last time actually doing a great job he looks much improved and in general I feel like that we are making great progress. I do not see any evidence of active infection locally nor systemically which is great news. 10-29-2022 upon evaluation today patient appears to be doing decently well in regard to his legs currently. I feel like that his swelling is much better I feel like that he is also doing much better in regard to the wounds in general. I do not see any signs of active infection which is great news and in general I think that he Matthew, [name] ([id number]) [id number] Page 7 of 9 is making excellent progress towards complete resolution. The patient does seem to have 1 issue that is with his wraps actually slipping down working I definitely need to work on  that aspect. 11-05-2022 upon evaluation today patient appears to be doing excellent in regard to his wound. He has been tolerating the dressing changes without complication. He actually seems to be making excellent progress I am extremely pleased with where things stand currently. I do believe that we are really making good progress and I think that we are on the right track towards complete closure I think the compression wrap has been a dramatic shift in a good way for him as far as getting the wounds healed a lot of these areas that we will continue to leave Completely sealed up. 11-12-2022 upon evaluation today patient appears to be doing well currently in regard to his leg ulcer. The compression wrapping seems to be doing an excellent job. Fortunately I do not see any evidence of active infection locally nor systemically which is great news and in general I do believe that we are making good headway here towards complete closure. 11-19-2022 upon evaluation today patient appears to be doing well currently in regard to his wounds. He is actually showing signs of excellent improvement and very pleased with where we stand I think that he is making great progress. I do not see any evidence of infection at this time which is great news. 11-26-2022 upon evaluation today patient appears to be doing well currently in regard to his wound. He is actually been tolerating the dressing changes without complication. Fortunately I do not see any evidence of active infection locally nor systemically which is great news I think he is making excellent progress here towards closure. 12-03-2022 upon evaluation today patient appears to be doing well currently in regard to his wound. He in fact at both locations is doing great and seems to be making excellent progress and very pleased in that regard. I do not see any signs of active infection at this time. 12-10-2022 upon evaluation patient's wound is actually significantly  smaller with one of the satellite lesions closed and there is just 1 small area remaining and this is actually doing quite well. I am very pleased with her progress. 12-15-2022 upon evaluation today patient appears to be doing actually pretty well in regard to his wound on the left leg. He does have a little  area on the backside that open that was previously close last week due to the fact that his wrap got wet over the weekend and he had to make do with stuff they can find at the drugstore. They made this work out however and the good news is he actually seems to be doing significantly better and the wound looks fine except for the fact that he has a couple of areas that just reopen on the backside very tiny. Nonetheless I do believe that we can go ahead and see what we do about trying to get this moving in the right direction yet again. 12-22-2022 upon evaluation today patient appears to be doing poorly in regard to his wrap slid down and his leg is extremely swollen. It is also painful secondary to it being so swollen. Fortunately I do not see any evidence of infection right now but at the same time I do believe that if we do not get the swelling under control is not really good to alleviate his discomfort. We definitely can need to change his wrap out however in short order. 12-31-2022 upon evaluation today patient appears to be doing well currently in regard to his wound. He has been tolerating the dressing changes without complication. This actually looks to be doing much better today compared to where we were. Fortunately I do not see any evidence of active infection locally or systemically which is great news. 01-11-2023 upon evaluation today patient appears to be doing well currently in regard to his wound which is actually showing signs of significant improvement. Fortunately I do not see any signs of infection locally or systemically which is great news and in general I do believe that we are  making good headway towards complete closure. 01-19-2023 upon evaluation today patient appears to be doing well currently hemoglobin to his wound. He has been tolerating the dressing changes without complication. Fortunately the wound is not infected unfortunately it is a little bigger due to the fact that the wrap slid down. I do not see any signs of worsening or infection at this time. 01-28-2023 upon evaluation today patient appears to be doing well currently in regard to his wound. He has been tolerating the dressing changes without complication. Fortunately I do not see any signs of infection the compression wrap is doing a really good job and I think it worked pretty much just about healed. 02-04-2023 upon evaluation today patient appears to be doing excellent in regard to his wounds. In fact he is pretty much completely healed on the left although I think this may have just a pinpoint opening. Nonetheless I think it is 1 more week to toughen up. 02-16-2023 upon evaluation today patient appears to be doing well currently in regard to his wound. He has been tolerating the dressing changes without complication. Fortunately there does not appear to be any evidence of active infection locally or systemically which is good news. In fact the original wounds we been taking care of on the left and right legs are completely healed unfortunately his wrap slipped down the right leg and there are 2 small areas on the anterior portion of his leg which are open at this point. When I wrapped his right leg the left leg is completely closed however he is in agreement to his own compression sock. 02-26-2023 upon evaluation today patient appears to be doing well currently in regard to his left leg which is still close in the right leg is doing significantly  better. Fortunately there does not appear to be any signs of active infection locally or systemically which is great news and in general I do believe that we will  making headway towards complete closure. 03-05-2023 upon evaluation today patient actually appears to be doing excellent in regard to his wounds one of the areas healed the other is significantly improved. I am actually very pleased with where we stand today and I think that he is doing well with his compression stockings which is excellent as well. 03-19-2023 upon evaluation today patient appears to be doing poorly currently in regard to his wounds. He seems to be doing a bit worse as far as both legs are concerned as far as open wounds and I think that we are going need to address this rapidly to keep it from backtracking and getting significantly worse. He voiced understanding he kind of somewhat expected I believe. 03-29-2023 upon evaluation patient actually showing signs of definite improvement with regard to his wounds currently with some already drying up and on the right lateral leg this is much smaller. Will continue to wrap him this seems to be doing quite well. Objective Constitutional Well-nourished and well-hydrated in no acute distress. Vitals Time Taken: 8:46 AM, Height: 73 in, Weight: 358 lbs, BMI: 47.2, Temperature: 98 F, Pulse: 67 bpm, Respiratory Rate: 18 breaths/min, Blood Pressure: 122/79 mmHg. [name], [name] ([id number]) [id number] Page 8 of 9 Respiratory normal breathing without difficulty. Psychiatric this patient is able to make decisions and demonstrates good insight into disease process. Alert and Oriented x 3. pleasant and cooperative. General Notes: Upon inspection patient's wound bed showed signs of good granulation and epithelization at this point. Fortunately I do not see any evidence of active infection locally or systemically which is great news and in general I do believe that he is making good headway towards complete closure which is great news. We can continue with the wrapping. Integumentary (Hair, Skin) Wound #3 status is  Open. Original cause of wound was Gradually Appeared. The date acquired was: 02/04/2023. The wound has been in treatment 7 weeks. The wound is located on the Right Lower Leg. The wound measures 0.7cm length x 0.8cm width x 0.1cm depth; 0.44cm^2 area and 0.044cm^3 volume. There is Fat Layer (Subcutaneous Tissue) exposed. There is no tunneling or undermining noted. There is a medium amount of serosanguineous drainage noted. There is medium (34-66%) red granulation within the wound bed. There is a medium (34-66%) amount of necrotic tissue within the wound bed including Adherent Slough. Assessment Active Problems ICD-10 Type 2 diabetes mellitus with other skin ulcer Lymphedema, not elsewhere classified Non-pressure chronic ulcer of other part of left lower leg with fat layer exposed Non-pressure chronic ulcer of other part of right lower leg with fat layer exposed Essential (primary) hypertension Chronic combined systolic (congestive) and diastolic (congestive) heart failure Cardiac arrhythmia, unspecified Procedures Wound #3 Pre-procedure diagnosis of Wound #3 is a Lymphedema located on the Right Lower Leg . There was a Double Layer Compression Therapy Procedure by Matthew Pax, RN. Post procedure Diagnosis Wound #3: Same as Pre-Procedure There was a Double Layer Compression Therapy Procedure by Matthew Pax, RN. Post procedure Diagnosis Wound #: Same as Pre-Procedure Plan Follow-up Appointments: Return Appointment in 1 week. Nurse Visit as needed Bathing/ Shower/ Hygiene: May shower with wound dressing protected with water repellent cover or cast protector. Anesthetic (Use 'Patient Medications' Section for Anesthetic Order Entry): Lidocaine applied to wound bed Edema Control - Lymphedema / Segmental Compressive Device /  Other: UrgoK2 LITE - LLL Elevate, Exercise Daily and Avoid Standing for Long Periods of Time. Elevate legs to the level of the heart and pump ankles as often as  possible Elevate leg(s) parallel to the floor when sitting. Compression Pump: Use compression pump on left lower extremity for 60 minutes, twice daily. Compression Pump: Use compression pump on right lower extremity for 60 minutes, twice daily. DO YOUR BEST to sleep in the bed at night. DO NOT sleep in your recliner. Long hours of sitting in a recliner leads to swelling of the legs and/or potential wounds on your backside. Non-Wound Condition: Additional non-wound orders/instructions: - xeroform and abd to right heel prior to wrapping WOUND #3: - Lower Leg Wound Laterality: Right Cleanser: Soap and Water 1 x Per Day/30 Days Discharge Instructions: Gently cleanse wound with antibacterial soap, rinse and pat dry prior to dressing wounds Prim Dressing: Silvercel Small 2x2 (in/in) 1 x Per Day/30 Days ary Discharge Instructions: Apply Silvercel Small 2x2 (in/in) as instructed Secondary Dressing: ABD Pad 5x9 (in/in) 1 x Per Day/30 Days Discharge Instructions: Cover with ABD pad Com pression Wrap: Urgo K2 Lite, two layer compression system, large 1 x Per Day/30 Days 1. I would recommend currently based on what we are seeing that we go and have the patient continue with the silver alginate dressing followed by the ABD pad and the Urgo K2 lite compression wraps bilaterally. [name], [name] ([id number]) [id number] Page 9 of 9 2. I am also can recommend that he should continue to elevate his legs as well as uses lymphedema pumps I think the more of this he does the better off he will be. We will see patient back for reevaluation in 1 week here in the clinic. If anything worsens or changes patient will contact our office for additional recommendations. Electronic Signature(s) Signed: 04/02/2023 11:16:55 AM By: Matthew Derry PA-C Entered By: [name] on 04/02/2023 08:16:55 -------------------------------------------------------------------------------- SuperBill  Details Patient Name: Date of Service: [name], Matthew [name] 03/29/2023 Medical Record Number: [id number] Patient Account Number: [id number] Date of Birth/Sex: Treating RN: 11/17/1955 (66 y.o. [name]) [name] Primary Care Provider: [name] Other Clinician: Referring Provider: Treating Provider/Extender: [name] in Treatment: 36 Diagnosis Coding ICD-10 Codes Code Description E11.622 Type 2 diabetes mellitus with other skin ulcer I89.0 Lymphedema, not elsewhere classified L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 L97.812 Non-pressure chronic ulcer of other part of right lower leg with fat layer exposed I10 Essential (primary) hypertension I50.42 Chronic combined systolic (congestive) and diastolic (congestive) heart failure I49.9 Cardiac arrhythmia, unspecified Facility Procedures : CPT4: Code [id number] foot Description: 1 BILATERAL: Application of multi-layer venous compression system; leg (below knee), including ankle and . Modifier: Quantity: 1 Physician Procedures : CPT4 Code Description Modifier [id number] - WC PHYS LEVEL 3 - EST PT ICD-10 Diagnosis Description E11.622 Type 2 diabetes mellitus with other skin ulcer I89.0 Lymphedema, not elsewhere classified L97.822 Non-pressure chronic ulcer of other part of  left lower leg with fat layer exposed L97.812 Non-pressure chronic ulcer of other part of right lower leg with fat layer exposed Quantity: 1 Electronic Signature(s) Signed: 03/29/2023 4:45:59 PM By: Matthew Derry PA-C Entered By: [name] on 03/29/2023 13:45:59

## 2023-03-29 NOTE — Progress Notes (Addendum)
DAMARIYON, HARSIN ([id number]) [id number] Page 1 of 9 Visit Report for 03/29/2023 Arrival Information Details Patient Name: Date of Service: [name] 03/29/2023 8:45 A M Medical Record Number: [id number] Patient Account Number: [id number] Date of Birth/Sex: Treating RN: [name] (66 y.o. [name]) Yevonne Pax Primary Care Karma Hiney: [name] Other Clinician: Referring Shaune Malacara: Treating Usher Hedberg/Extender: Matthew Folks in Treatment: 36 Visit Information History Since Last Visit Added or deleted any medications: No Patient Arrived: Ambulatory Any new allergies or adverse reactions: No Arrival Time: 08:45 Had a fall or experienced change in No Accompanied By: self activities of daily living that may affect Transfer Assistance: None risk of falls: Patient Identification Verified: Yes Signs or symptoms of abuse/neglect since last visito No Secondary Verification Process Completed: Yes Hospitalized since last visit: No Patient Requires Transmission-Based Precautions: No Implantable device outside of the clinic excluding No Patient Has Alerts: No cellular tissue based products placed in the center since last visit: Has Dressing in Place as Prescribed: Yes Has Compression in Place as Prescribed: Yes Pain Present Now: No Electronic Signature(s) Signed: 04/02/2023 11:41:18 AM By: Yevonne Pax RN Entered By: Yevonne Pax on 03/29/2023 05:46:01 -------------------------------------------------------------------------------- Clinic Level of Care Assessment Details Patient Name: Date of Service: [name] 03/29/2023 8:45 A M Medical Record Number: [id number] Patient Account Number: [id number] Date of Birth/Sex: Treating RN: [name] (66 y.o. [name]) Yevonne Pax Primary Care Taila Basinski: [name] Other Clinician: Referring Loni Abdon: Treating Shabria Egley/Extender: Matthew Folks in Treatment: 36 Clinic  Level of Care Assessment Items TOOL 1 Quantity Score []  - 0 Use when EandM and Procedure is performed on INITIAL visit ASSESSMENTS - Nursing Assessment / Reassessment []  - 0 General Physical Exam (combine w/ comprehensive assessment (listed just below) when performed on new pt. evals) []  - 0 Comprehensive Assessment (HX, ROS, Risk Assessments, Wounds Hx, etc.) [name], [name] ([id number]) [id number] Page 2 of 9 ASSESSMENTS - Wound and Skin Assessment / Reassessment []  - 0 Dermatologic / Skin Assessment (not related to wound area) ASSESSMENTS - Ostomy and/or Continence Assessment and Care []  - 0 Incontinence Assessment and Management []  - 0 Ostomy Care Assessment and Management (repouching, etc.) PROCESS - Coordination of Care []  - 0 Simple Patient / Family Education for ongoing care []  - 0 Complex (extensive) Patient / Family Education for ongoing care []  - 0 Staff obtains Chiropractor, Records, T Results / Process Orders est []  - 0 Staff telephones HHA, Nursing Homes / Clarify orders / etc []  - 0 Routine Transfer to another Facility (non-emergent condition) []  - 0 Routine Hospital Admission (non-emergent condition) []  - 0 New Admissions / Manufacturing engineer / Ordering NPWT Apligraf, etc. , []  - 0 Emergency Hospital Admission (emergent condition) PROCESS - Special Needs []  - 0 Pediatric / Minor Patient Management []  - 0 Isolation Patient Management []  - 0 Hearing / Language / Visual special needs []  - 0 Assessment of Community assistance (transportation, D/C planning, etc.) []  - 0 Additional assistance / Altered mentation []  - 0 Support Surface(s) Assessment (bed, cushion, seat, etc.) INTERVENTIONS - Miscellaneous []  - 0 External ear exam []  - 0 Patient Transfer (multiple staff / Nurse, adult / Similar devices) []  - 0 Simple Staple / Suture removal (25 or less) []  - 0 Complex Staple / Suture removal ([name] or more) []  -  0 Hypo/Hyperglycemic Management (do not check if billed separately) []  - 0 Ankle / Brachial Index (ABI) - do not check if billed separately Has  the patient been seen at the hospital within the last three years: Yes Total Score: 0 Level Of Care: ____ Electronic Signature(s) Signed: 04/02/2023 11:41:18 AM By: Yevonne Pax RN Entered By: Yevonne Pax on 03/29/2023 06:56:56 -------------------------------------------------------------------------------- Compression Therapy Details Patient Name: Date of Service: Matthew Burly [name] 03/29/2023 8:45 A M Medical Record Number: [id number] Patient Account Number: [id number] Date of Birth/Sex: Treating RN: [name]-Sep-[name] (66 y.o. [name] Primary Care Ples Trudel: [name] Other Clinician: Referring Sunjai Levandoski: Treating Lacresia Darwish/Extender: Ayron, Florance ([id number]) [id number] Page 3 of 9 Weeks in Treatment: 36 Compression Therapy Performed for Wound Assessment: Wound #3 Right Lower Leg Performed By: Clinician Yevonne Pax, RN Compression Type: Double Layer Post Procedure Diagnosis Same as Pre-procedure Electronic Signature(s) Signed: 04/02/2023 11:41:18 AM By: Yevonne Pax RN Entered By: Yevonne Pax on 03/29/2023 06:56:29 -------------------------------------------------------------------------------- Compression Therapy Details Patient Name: Date of Service: Matthew Burly [name] 03/29/2023 8:45 A M Medical Record Number: [id number] Patient Account Number: [id number] Date of Birth/Sex: Treating RN: [name][birth year] (66 y.o. [name] Primary Care Neshia Mckenzie: [name] Other Clinician: Referring Laquincy Eastridge: Treating Twylia Oka/Extender: Matthew Folks in Treatment: 36 Compression Therapy Performed for Wound Assessment: NonWound Condition Lymphedema - Left Leg Performed By: Clinician Yevonne Pax, RN Compression Type: Double Layer Post Procedure Diagnosis Same  as Pre-procedure Electronic Signature(s) Signed: 04/02/2023 11:41:18 AM By: Yevonne Pax RN Entered By: Yevonne Pax on 03/29/2023 06:56:46 -------------------------------------------------------------------------------- Encounter Discharge Information Details Patient Name: Date of Service: Matthew Burly [name] 03/29/2023 8:45 A M Medical Record Number: [id number] Patient Account Number: [id number] Date of Birth/Sex: Treating RN: [name], [name] (66 y.o. [name] Primary Care Jennetta Flood: [name] Other Clinician: Referring Reise Hietala: Treating Catrice Zuleta/Extender: Matthew Folks in Treatment: 36 Encounter Discharge Information Items Discharge Condition: Stable Ambulatory Status: Ambulatory Discharge Destination: Home Transportation: [address] [name], [name] ([id number]) [phone].pdf Page 4 of 9 Accompanied By: self Schedule Follow-up Appointment: Yes Clinical Summary of Care: Electronic Signature(s) Signed: 04/02/2023 11:41:18 AM By: Yevonne Pax RN Entered By: Yevonne Pax on 03/29/2023 07:00:21 -------------------------------------------------------------------------------- Lower Extremity Assessment Details Patient Name: Date of Service: Matthew Burly [name] 03/29/2023 8:45 A M Medical Record Number: [id number] Patient Account Number: [id number] Date of Birth/Sex: Treating RN: [birth year], [name] (66 y.o. [name]) Yevonne Pax Primary Care Cheney Gosch: [name] Other Clinician: Referring Kimoni Pickerill: Treating Chrishun Scheer/Extender: Matthew Folks in Treatment: 36 Edema Assessment Assessed: Kyra Searles: No] [Right: No] [Left: Edema] [Right: :] Calf Left: Right: Point of Measurement: 36 cm From Medial Instep 55 cm 61 cm Ankle Left: Right: Point of Measurement: 13 cm From Medial Instep 30 cm 31 cm Vascular Assessment Pulses: Dorsalis Pedis Palpable: [Left:Yes] [Right:Yes] Extremity colors, hair growth, and conditions: Extremity  Color: [Left:Hyperpigmented] [Right:Hyperpigmented] Hair Growth on Extremity: [Left:No] [Right:No] Temperature of Extremity: [Left:Warm] [Right:Warm] Capillary Refill: [Left:< 3 seconds] [Right:< 3 seconds] Dependent Rubor: [Left:No] [Right:No] Blanched when Elevated: [Left:No No] [Right:No No] Toe Nail Assessment Left: Right: Thick: Yes Yes Discolored: Yes Yes Deformed: Yes Yes Improper Length and Hygiene: Yes Yes Electronic Signature(s) Signed: 04/02/2023 11:41:18 AM By: Yevonne Pax RN Entered By: Yevonne Pax on 03/29/2023 05:54:55 [name] ([id number]) [id number] Page 5 of 9 -------------------------------------------------------------------------------- Multi Wound Chart Details Patient Name: Date of Service: [name] 03/29/2023 8:45 A M Medical Record Number: [id number] Patient Account Number: [id number] Date of Birth/Sex: Treating RN: [name][birth year] (66 y.o. [name]) Yevonne Pax Primary Care Kenneshia Rehm: [name] Other Clinician: Referring Izamar Linden: Treating [name]/Extender:  Stone, Hoyt Polite, Ronald Weeks in Treatment: 36 Vital Signs Height(in): 73 Pulse(bpm): 67 Weight(lbs): 358 Blood Pressure(mmHg): 122/79 Body Mass Index(BMI): 47.2 Temperature(F): 98 Respiratory Rate(breaths/min): 18 [3:Photos: No Photos Right Lower Leg Wound Location: Gradually Appeared Wounding Event: Lymphedema Primary Etiology: Arrhythmia, Congestive Heart Failure, N/A Comorbid History: Hypertension, Type II Diabetes 02/04/2023 Date Acquired: 7 Weeks of Treatment: Open Wound  Status: No Wound Recurrence: 0.7x0.8x0.1 Measurements L x W x D (cm) 0.44 A (cm) : rea 0.044 Volume (cm) : 64.10% % Reduction in A rea: 82.00% % Reduction in Volume: Partial Thickness Classification: Medium Exudate A mount: Serosanguineous Exudate Type:  red, brown Exudate Color: Medium (34-66%) Granulation A mount: Red Granulation Quality: Medium (34-66%) Necrotic A mount: Fat Layer  (Subcutaneous Tissue): Yes N/A Exposed Structures: Fascia: No Tendon: No Muscle: No Joint: No Bone: No Small (1-33%)  Epithelialization:] [N/A:N/A N/A N/A N/A N/A N/A N/A N/A N/A N/A N/A N/A N/A N/A N/A N/A N/A N/A N/A N/A N/A] Treatment Notes Electronic Signature(s) Signed: 04/02/2023 11:41:18 AM By: Yevonne Pax RN Entered By: Yevonne Pax on 03/29/2023 05:55:06 [name] ([id number]) [id number] Page 6 of 9 -------------------------------------------------------------------------------- Multi-Disciplinary Care Plan Details Patient Name: Date of Service: [name] 03/29/2023 8:45 A M Medical Record Number: [id number] Patient Account Number: [id number] Date of Birth/Sex: Treating RN: [name] (66 y.o. 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 (use after diagnosis is confirmed) Goals: Patient will maintain optimal edema control Date Initiated: 10/15/2022 Target Resolution Date: 04/15/2023 Goal Status: Active Patient/caregiver will verbalize understanding of disease process and disease management Date Initiated: 10/15/2022 Date Inactivated: 11/12/2022 Target Resolution Date: 10/15/2022 Goal Status: Met Verify adequate tissue perfusion prior to therapeutic compression application Date Initiated: 10/15/2022 Date Inactivated: 11/12/2022 Target Resolution Date: 10/15/2022 Goal Status: Met Interventions: Assess peripheral edema status every visit. Compression as ordered Provide education on venous insufficiency Treatment Activities: Therapeutic compression applied : 10/15/2022 Notes: Electronic Signature(s) Signed: 04/02/2023 11:41:18 AM By: Yevonne Pax RN Entered By: Yevonne Pax on 03/29/2023  05:55:39 -------------------------------------------------------------------------------- Pain Assessment Details Patient Name: Date of Service: Matthew Burly [name] 03/29/2023 8:45 A M Medical Record Number: [id number] Patient Account Number: [id number] Date of Birth/Sex: Treating RN: [birth year] (66 y.o. [name] Primary Care Samarie Pinder: [name] Other Clinician: Referring Equilla Que: Treating Kanoe Wanner/Extender: [name], [name] ([id number]) [id number] Page 7 of 9 Weeks in Treatment: 36 Active Problems Location of Pain Severity and Description of Pain Patient Has Paino No Site Locations Pain Management and Medication Current Pain Management: Electronic Signature(s) Signed: 04/02/2023 11:41:18 AM By: Yevonne Pax RN Entered By: Yevonne Pax on 03/29/2023 05:47:02 -------------------------------------------------------------------------------- Patient/Caregiver Education Details Patient Name: Date of Service: [name] 10/14/2024andnbsp8:45 A M Medical Record Number: [id number] Patient Account Number: [id number] Date of Birth/Gender: Treating RN: [name][birth year] (66 y.o. [name] Primary Care Physician: [name] Other Clinician: Referring Physician: Treating Physician/Extender: Matthew Folks in Treatment: 36 Education Assessment Education Provided To: Patient Education Topics Provided Wound/Skin Impairment: Handouts: Caring for Your Ulcer Methods: Demonstration Responses: State content correctly Electronic Signature(s) Signed: 04/02/2023 11:41:18 AM By: Yevonne Pax RN Entered By: Yevonne Pax on 03/29/2023 05:56:22 [name] ([id number]) [id number] Page 8 of 9 -------------------------------------------------------------------------------- Wound Assessment Details Patient Name: Date of Service: Matthew Burly [name] 03/29/2023 8:45 A M Medical  Record  Number: [id number] Patient Account Number: [id number] Date of Birth/Sex: Treating RN: 13-Mar-[name] (66 y.o. [name]) Yevonne Pax Primary Care Breauna Mazzeo: [name] Other Clinician: Referring Kofi Murrell: Treating Zelma Mazariego/Extender: Matthew Folks in Treatment: 36 Wound Status Wound Number: 3 Primary Lymphedema Etiology: Wound Location: Right Lower Leg Wound Status: Open Wounding Event: Gradually Appeared Comorbid Arrhythmia, Congestive Heart Failure, Hypertension, Type II Date Acquired: 02/04/2023 History: Diabetes Weeks Of Treatment: 7 Clustered Wound: No Wound Measurements Length: (cm) 0.7 Width: (cm) 0.8 Depth: (cm) 0.1 Area: (cm) 0.44 Volume: (cm) 0.044 % Reduction in Area: 64.1% % Reduction in Volume: 82% Epithelialization: Small (1-33%) Tunneling: No Undermining: No Wound Description Classification: Partial Thickness Exudate Amount: Medium Exudate Type: Serosanguineous Exudate Color: red, brown Foul Odor After Cleansing: No Slough/Fibrino Yes Wound Bed Granulation Amount: Medium (34-66%) Exposed Structure Granulation Quality: Red Fascia Exposed: No Necrotic Amount: Medium (34-66%) Fat Layer (Subcutaneous Tissue) Exposed: Yes Necrotic Quality: Adherent Slough Tendon Exposed: No Muscle Exposed: No Joint Exposed: No Bone Exposed: No Treatment Notes Wound #3 (Lower Leg) Wound Laterality: Right Cleanser Soap and Water Discharge Instruction: Gently cleanse wound with antibacterial soap, rinse and pat dry prior to dressing wounds Peri-Wound Care Topical Primary Dressing Silvercel Small 2x2 (in/in) Discharge Instruction: Apply Silvercel Small 2x2 (in/in) as instructed Secondary Dressing ABD Pad 5x9 (in/in) Discharge Instruction: Cover with ABD pad Secured With Compression [name], [name] ([id number]) [id number] Page 9 of 9 Urgo K2 Lite, two layer compression system, large Compression  Stockings Add-Ons Electronic Signature(s) Signed: 04/02/2023 11:41:18 AM By: Yevonne Pax RN Entered By: Yevonne Pax on 03/29/2023 05:51:07 -------------------------------------------------------------------------------- Vitals Details Patient Name: Date of Service: [name], Matthew [name] 03/29/2023 8:45 A M Medical Record Number: [id number] Patient Account Number: [id number] Date of Birth/Sex: Treating RN: September [birth year], [name] (66 y.o. [name]) Yevonne Pax Primary Care Kennetha Pearman: [name] Other Clinician: Referring Deleon Passe: Treating Adlyn Fife/Extender: Matthew Folks in Treatment: 36 Vital Signs Time Taken: 08:46 Temperature (F): 98 Height (in): 73 Pulse (bpm): 67 Weight (lbs): 358 Respiratory Rate (breaths/min): 18 Body Mass Index (BMI): 47.2 Blood Pressure (mmHg): 122/79 Reference Range: 80 - 120 mg / dl Electronic Signature(s) Signed: 04/02/2023 11:41:18 AM By: Yevonne Pax RN Entered By: Yevonne Pax on 03/29/2023 05:46:47

## 2023-04-06 ENCOUNTER — Encounter: Payer: PPO | Admitting: Physician Assistant

## 2023-04-06 DIAGNOSIS — L97812 Non-pressure chronic ulcer of other part of right lower leg with fat layer exposed: Secondary | ICD-10-CM | POA: Diagnosis not present

## 2023-04-06 DIAGNOSIS — L97222 Non-pressure chronic ulcer of left calf with fat layer exposed: Secondary | ICD-10-CM | POA: Diagnosis not present

## 2023-04-06 DIAGNOSIS — L97212 Non-pressure chronic ulcer of right calf with fat layer exposed: Secondary | ICD-10-CM | POA: Diagnosis not present

## 2023-04-06 DIAGNOSIS — I890 Lymphedema, not elsewhere classified: Secondary | ICD-10-CM | POA: Diagnosis not present

## 2023-04-06 DIAGNOSIS — E11622 Type 2 diabetes mellitus with other skin ulcer: Secondary | ICD-10-CM | POA: Diagnosis not present

## 2023-04-06 NOTE — Progress Notes (Addendum)
KAYLEN, CASAMENTO ([id number]) [id number] Page 1 of 10 Visit Report for 04/06/2023 Chief Complaint Document Details Patient Name: Date of Service: Guy Franco 04/06/2023 10:30 A M Medical Record Number: [id number] Patient Account Number: [id number] Date of Birth/Sex: Treating RN: 31-Dec-1955 (67 y.o. [name] Primary Care Provider: [name] Other Clinician: Referring Provider: Treating Provider/Extender: Matthew Folks in Treatment: 37 Information Obtained from: Patient Chief Complaint Bilateral LE ulcers and lymphedema Electronic Signature(s) Signed: 04/06/2023 10:45:49 AM By: Allen Derry PA-C Entered By: Allen Derry on 04/06/2023 10:45:49 -------------------------------------------------------------------------------- HPI Details Patient Name: Date of Service: [name], [name] 04/06/2023 10:30 A M Medical Record Number: [id number] Patient Account Number: [id number] Date of Birth/Sex: Treating RN: 1955/10/08 (67 y.o. [name] Primary Care Provider: [name] Other Clinician: Referring Provider: Treating Provider/Extender: Matthew Folks in Treatment: 37 History of Present Illness HPI Description: 07-16-2022 upon evaluation today patient appears to be doing poorly currently in regard to his his legs. The left leg is actually worse than the right. This is a patient that is previously been seen in the [place] office and at that point was doing really quite well with compression wrapping and often alginate are either Hydrofera Blue dressings. Fortunately he has gone since November 2020 until just current with out having any issue or need for wound care services which is great news. Unfortunately he is here today because he is having some issues at this point. Patient does have a history of several medical conditions which include diabetes mellitus type 2, lymphedema, hypertension,  congestive heart failure, and cardiac arrhythmia though is not sure that this is actually atrial fibrillation based on what he has been told. He does have lymphedema pumps but tells me he has not used them since the summer when he had to move to a remodel of his building and subsequently never unpacked them. 07-23-2022 upon evaluation today patient appears to be doing well with regard to his legs. He is going require some sharp debridement today but overall seems to be making some progress. I am pleased in that regard he has much less drainage that he had did last week I think the compression wraps are definitely helping. 07-30-2022 upon evaluation today patient appears to be doing poorly in regard to his legs he actually has blue-green drainage which I think is consistent with Pseudomonas. I believe that we need to address this as soon as possible and I discussed that with the patient today as well. Fortunately I do not see any signs of active infection locally nor systemically at this time which is great news. No fevers, chills, nausea, vomiting, or diarrhea. DAJION, BRIAR ([id number]) [id number] Page 2 of 10 08-13-2022 upon evaluation today patient appears to be doing well currently in regard to his leg ulcers which are actually looking much better. Fortunately there does not appear to be any signs of active infection at this time which is great news. No fevers, chills, nausea, vomiting, or diarrhea. 3/7; patient with predominantly a left medial lower extremity wound as well as several smaller areas and a small area on the right lateral. His Jodie Echevaria came in today. We are using silver alginate as the primary dressing 09-03-2022 upon evaluation today patient appears to be doing well currently in regard to his wounds. He has been tolerating the dressing changes without complication. Fortunately there does not appear to be any signs of infection there is needed however for  sharp  debridement. 09-10-2022 upon evaluation today patient appears to be doing well currently in regard to his wound. Has been tolerating the dressing changes without complication. Fortunately there does not appear to be any signs of infection looking systemically which is great news and overall I am extremely pleased with where we stand today. I do not see any signs of infection. 09-17-2022 upon evaluation today patient actually is making signs of improvement the good news is he is making good improvement. With that being said he still is having quite a time keeping the swelling down. We have been using Tubigrip I think that still probably our best option but nonetheless I think that we are making progress with regard to his wounds. 09-24-2022 upon evaluation today patient appears to be doing well currently in regard to his wounds is continuing to make improvements at this point which is great news and overall I am extremely pleased with where we stand today. Fortunately I do not see any evidence of active infection at this time which is great news and in general I think he is doing quite well with the [facility] and silver alginate. 10-01-2022 upon evaluation today patient appears to be doing pretty well currently in regard to his wounds. He has been tolerating the dressing changes without complication. Fortunately I do not see any evidence of active infection locally nor systemically which is great news and I do believe that 10-08-2022 upon evaluation today patient's wounds actually are showing some signs of improvement which is great news. Still this is very slow to heal I really feel like he would do better if we can get him in some stronger compression wrapping he does have the juxta lite compression wraps therefore we can actually see about doing that over top of his Tubigrip to see if that can be beneficial. He does not love the hide he had as they are not the most comfortable thing for him but  nonetheless I think it would be beneficial he is in agreement with going forward with this. 10-15-2022 upon evaluation today patient appears to be doing well currently in regard to his wounds that do not appear to be infected which is good news. With that being said unfortunately he still continues to have significant amount of swelling we really need to do something to try to get the swelling under control. I do not see any signs of infection locally nor systemically but at the same time the amount of edema is tremendous. The juxta lite helps but again he is only taking the fluid pills once or twice a week due to the amount that makes him go to the restroom. Therefore he does not take this daily which is really what he needs on a more regular basis. We need to try to get some compression on and is a little bit stronger. 10-22-2022 upon evaluation today patient actually appears to be making some really good progress in regard to his wound. The compression wrap was put on last time actually doing a great job he looks much improved and in general I feel like that we are making great progress. I do not see any evidence of active infection locally nor systemically which is great news. 10-29-2022 upon evaluation today patient appears to be doing decently well in regard to his legs currently. I feel like that his swelling is much better I feel like that he is also doing much better in regard to the wounds in general. I do not see any  signs of active infection which is great news and in general I think that he is making excellent progress towards complete resolution. The patient does seem to have 1 issue that is with his wraps actually slipping down working I definitely need to work on that aspect. 11-05-2022 upon evaluation today patient appears to be doing excellent in regard to his wound. He has been tolerating the dressing changes without complication. He actually seems to be making excellent progress I am  extremely pleased with where things stand currently. I do believe that we are really making good progress and I think that we are on the right track towards complete closure I think the compression wrap has been a dramatic shift in a good way for him as far as getting the wounds healed a lot of these areas that we will continue to leave Completely sealed up. 11-12-2022 upon evaluation today patient appears to be doing well currently in regard to his leg ulcer. The compression wrapping seems to be doing an excellent job. Fortunately I do not see any evidence of active infection locally nor systemically which is great news and in general I do believe that we are making good headway here towards complete closure. 11-19-2022 upon evaluation today patient appears to be doing well currently in regard to his wounds. He is actually showing signs of excellent improvement and very pleased with where we stand I think that he is making great progress. I do not see any evidence of infection at this time which is great news. 11-26-2022 upon evaluation today patient appears to be doing well currently in regard to his wound. He is actually been tolerating the dressing changes without complication. Fortunately I do not see any evidence of active infection locally nor systemically which is great news I think he is making excellent progress here towards closure. 12-03-2022 upon evaluation today patient appears to be doing well currently in regard to his wound. He in fact at both locations is doing great and seems to be making excellent progress and very pleased in that regard. I do not see any signs of active infection at this time. 12-10-2022 upon evaluation patient's wound is actually significantly smaller with one of the satellite lesions closed and there is just 1 small area remaining and this is actually doing quite well. I am very pleased with her progress. 12-15-2022 upon evaluation today patient appears to be doing  actually pretty well in regard to his wound on the left leg. He does have a little area on the backside that open that was previously close last week due to the fact that his wrap got wet over the weekend and he had to make do with stuff they can find at the drugstore. They made this work out however and the good news is he actually seems to be doing significantly better and the wound looks fine except for the fact that he has a couple of areas that just reopen on the backside very tiny. Nonetheless I do believe that we can go ahead and see what we do about trying to get this moving in the right direction yet again. 12-22-2022 upon evaluation today patient appears to be doing poorly in regard to his wrap slid down and his leg is extremely swollen. It is also painful secondary to it being so swollen. Fortunately I do not see any evidence of infection right now but at the same time I do believe that if we do not get the swelling under control  is not really good to alleviate his discomfort. We definitely can need to change his wrap out however in short order. 12-31-2022 upon evaluation today patient appears to be doing well currently in regard to his wound. He has been tolerating the dressing changes without complication. This actually looks to be doing much better today compared to where we were. Fortunately I do not see any evidence of active infection locally or systemically which is great news. 01-11-2023 upon evaluation today patient appears to be doing well currently in regard to his wound which is actually showing signs of significant improvement. Fortunately I do not see any signs of infection locally or systemically which is great news and in general I do believe that we are making good headway towards complete closure. 01-19-2023 upon evaluation today patient appears to be doing well currently hemoglobin to his wound. He has been tolerating the dressing changes without complication. Fortunately the  wound is not infected unfortunately it is a little bigger due to the fact that the wrap slid down. I do not see any signs of worsening or infection at this time. 01-28-2023 upon evaluation today patient appears to be doing well currently in regard to his wound. He has been tolerating the dressing changes without complication. Fortunately I do not see any signs of infection the compression wrap is doing a really good job and I think it worked pretty much just about healed. BURNES, SOULES ([id number]) [id number] Page 3 of 10 02-04-2023 upon evaluation today patient appears to be doing excellent in regard to his wounds. In fact he is pretty much completely healed on the left although I think this may have just a pinpoint opening. Nonetheless I think it is 1 more week to toughen up. 02-16-2023 upon evaluation today patient appears to be doing well currently in regard to his wound. He has been tolerating the dressing changes without complication. Fortunately there does not appear to be any evidence of active infection locally or systemically which is good news. In fact the original wounds we been taking care of on the left and right legs are completely healed unfortunately his wrap slipped down the right leg and there are 2 small areas on the anterior portion of his leg which are open at this point. When I wrapped his right leg the left leg is completely closed however he is in agreement to his own compression sock. 02-26-2023 upon evaluation today patient appears to be doing well currently in regard to his left leg which is still close in the right leg is doing significantly better. Fortunately there does not appear to be any signs of active infection locally or systemically which is great news and in general I do believe that we will making headway towards complete closure. 03-05-2023 upon evaluation today patient actually appears to be doing excellent in regard to his wounds one  of the areas healed the other is significantly improved. I am actually very pleased with where we stand today and I think that he is doing well with his compression stockings which is excellent as well. 03-19-2023 upon evaluation today patient appears to be doing poorly currently in regard to his wounds. He seems to be doing a bit worse as far as both legs are concerned as far as open wounds and I think that we are going need to address this rapidly to keep it from backtracking and getting significantly worse. He voiced understanding he kind of somewhat expected I believe. 03-29-2023 upon evaluation patient actually  showing signs of definite improvement with regard to his wounds currently with some already drying up and on the right lateral leg this is much smaller. Will continue to wrap him this seems to be doing quite well. 04-06-2023 upon evaluation today patient's wounds on the legs actually appear to be doing better which is great news. Fortunately I do not see any evidence of worsening overall and I do believe that the patient is making good headway towards closure which is good news. I do not see any signs of infection which is also excellent. He actually only had the wraps on for a couple of days before they started slipping down he had cut them off and therefore spent the remaining 5 days pretty much just in his compression socks he seems of done very well though. Electronic Signature(s) Signed: 04/06/2023 1:37:43 PM By: Allen Derry PA-C Entered By: Allen Derry on 04/06/2023 13:37:43 -------------------------------------------------------------------------------- Physical Exam Details Patient Name: Date of Service: Matthew Macadam J. 04/06/2023 10:30 A M Medical Record Number: [id number] Patient Account Number: [id number] Date of Birth/Sex: Treating RN: 09-10-1955 (67 y.o. [name] Primary Care Provider: [name] Other Clinician: Referring Provider: Treating  Provider/Extender: Matthew Folks in Treatment: 37 Constitutional Obese and well-hydrated in no acute distress. Respiratory normal breathing without difficulty. Psychiatric this patient is able to make decisions and demonstrates good insight into disease process. Alert and Oriented x 3. pleasant and cooperative. Notes Upon inspection patient's wounds did not require any sharp debridement he seems to be doing quite well as far as healing is concerned I am actually very pleased with where things stand today and I do not see any signs of infection at this time which is great news as well. Electronic Signature(s) Signed: 04/06/2023 1:38:04 PM By: Allen Derry PA-C Entered By: Allen Derry on 04/06/2023 13:38:04 [name] ([id number]) [id number] Page 4 of 10 -------------------------------------------------------------------------------- Physician Orders Details Patient Name: Date of Service: Guy Franco 04/06/2023 10:30 A M Medical Record Number: [id number] Patient Account Number: [id number] Date of Birth/Sex: Treating RN: 22-Aug-1955 (67 y.o. [name] Primary Care Provider: [name] Other Clinician: Referring Provider: Treating Provider/Extender: Matthew Folks in Treatment: 37 The following information was scribed by: Angelina Pih The information was scribed for: Allen Derry Verbal / Phone Orders: No Diagnosis Coding ICD-10 Coding Code Description E11.622 Type 2 diabetes mellitus with other skin ulcer I89.0 Lymphedema, not elsewhere classified L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 L97.812 Non-pressure chronic ulcer of other part of right lower leg with fat layer exposed I10 Essential (primary) hypertension I50.42 Chronic combined systolic (congestive) and diastolic (congestive) heart failure I49.9 Cardiac arrhythmia,  unspecified Follow-up Appointments Return Appointment in 1 week. Nurse Visit as needed Bathing/ Shower/ Hygiene May shower with wound dressing protected with water repellent cover or cast protector. Anesthetic (Use 'Patient Medications' Section for Anesthetic Order Entry) Lidocaine applied to wound bed Edema Control - Orders / Instructions Patient to wear own compression stockings. Remove compression stockings every night before going to bed and put on every morning when getting up. Elevate, Exercise Daily and A void Standing for Long Periods of Time. Elevate leg(s) parallel to the floor when sitting. DO YOUR BEST to sleep in the bed at night. DO NOT sleep in your recliner. Long hours of sitting in a recliner leads to swelling of the legs and/or potential wounds on your backside. Non-Wound Condition Right  Lower Extremity dditional non-wound orders/instructions: - A and D and abd to right heel A Wound Treatment Wound #3 - Lower Leg Wound Laterality: Right Cleanser: Soap and Water 1 x Per Day/30 Days Discharge Instructions: Gently cleanse wound with antibacterial soap, rinse and pat dry prior to dressing wounds Prim Dressing: Silvercel Small 2x2 (in/in) 1 x Per Day/30 Days ary Discharge Instructions: Apply Silvercel Small 2x2 (in/in) as instructed Secondary Dressing: Coverlet Latex-Free Fabric Adhesive Dressings 1 x Per Day/30 Days Discharge Instructions: 1.5 x 2 Wound #4 - Lower Leg Wound Laterality: Right, Posterior Cleanser: Soap and Water 1 x Per Day/30 Days Discharge Instructions: Gently cleanse wound with antibacterial soap, rinse and pat dry prior to dressing wounds Prim Dressing: Silvercel Small 2x2 (in/in) 1 x Per Day/30 Days ary Discharge Instructions: Apply Silvercel Small 2x2 (in/in) as instructed NICKOLAUS, LITWAK ([id number]) [phone].pdf Page 5 of 10 Secondary Dressing: Coverlet Latex-Free Fabric Adhesive Dressings 1 x Per Day/30  Days Discharge Instructions: 1.5 x 2 Wound #5 - Lower Leg Wound Laterality: Left, Posterior Cleanser: Soap and Water 1 x Per Day/30 Days Discharge Instructions: Gently cleanse wound with antibacterial soap, rinse and pat dry prior to dressing wounds Prim Dressing: Silvercel Small 2x2 (in/in) 1 x Per Day/30 Days ary Discharge Instructions: Apply Silvercel Small 2x2 (in/in) as instructed Secondary Dressing: Coverlet Latex-Free Fabric Adhesive Dressings 1 x Per Day/30 Days Discharge Instructions: 1.5 x 2 Electronic Signature(s) Signed: 04/14/2023 4:35:36 PM By: Allen Derry PA-C Signed: 04/15/2023 4:17:46 PM By: Angelina Pih Previous Signature: 04/06/2023 4:48:34 PM Version By: Allen Derry PA-C Previous Signature: 04/08/2023 4:44:43 PM Version By: Angelina Pih Entered By: Angelina Pih on 04/13/2023 09:48:49 -------------------------------------------------------------------------------- Problem List Details Patient Name: Date of Service: Matthew Burly [name] 04/06/2023 10:30 A M Medical Record Number: [id number] Patient Account Number: [id number] Date of Birth/Sex: Treating RN: 04/27/1956 (67 y.o. [name] Primary Care Provider: [name] Other Clinician: Referring Provider: Treating Provider/Extender: Matthew Folks in Treatment: 37 Active Problems ICD-10 Encounter Code Description Active Date MDM Diagnosis E11.622 Type 2 diabetes mellitus with other skin ulcer 07/16/2022 No Yes I89.0 Lymphedema, not elsewhere classified 07/16/2022 No Yes L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2/06/2022 No Yes L97.812 Non-pressure chronic ulcer of other part of right lower leg with fat layer 07/16/2022 No Yes exposed I10 Essential (primary) hypertension 07/16/2022 No Yes I50.42 Chronic combined systolic (congestive) and diastolic (congestive) heart failure 07/16/2022 No Yes CRISTO, APPLIN ([id number])  [phone].pdf Page 6 of 10 I49.9 Cardiac arrhythmia, unspecified 07/16/2022 No Yes Inactive Problems Resolved Problems Electronic Signature(s) Signed: 04/06/2023 10:45:46 AM By: Allen Derry PA-C Entered By: Allen Derry on 04/06/2023 10:45:46 -------------------------------------------------------------------------------- Progress Note Details Patient Name: Date of Service: [name], [name] 04/06/2023 10:30 A M Medical Record Number: [id number] Patient Account Number: [id number] Date of Birth/Sex: Treating RN: 06-25-1955 (67 y.o. [name] Primary Care Provider: [name] Other Clinician: Referring Provider: Treating Provider/Extender: Matthew Folks in Treatment: 37 Subjective Chief Complaint Information obtained from Patient Bilateral LE ulcers and lymphedema History of Present Illness (HPI) 07-16-2022 upon evaluation today patient appears to be doing poorly currently in regard to his his legs. The left leg is actually worse than the right. This is a patient that is previously been seen in the [place] office and at that point was doing really quite well with compression wrapping and often alginate are either Hydrofera Blue dressings. Fortunately he has gone  since November 2020 until just current with out having any issue or need for wound care services which is great news. Unfortunately he is here today because he is having some issues at this point. Patient does have a history of several medical conditions which include diabetes mellitus type 2, lymphedema, hypertension, congestive heart failure, and cardiac arrhythmia though is not sure that this is actually atrial fibrillation based on what he has been told. He does have lymphedema pumps but tells me he has not used them since the summer when he had to move to a remodel of his building and subsequently never unpacked them. 07-23-2022 upon evaluation today patient appears to be  doing well with regard to his legs. He is going require some sharp debridement today but overall seems to be making some progress. I am pleased in that regard he has much less drainage that he had did last week I think the compression wraps are definitely helping. 07-30-2022 upon evaluation today patient appears to be doing poorly in regard to his legs he actually has blue-green drainage which I think is consistent with Pseudomonas. I believe that we need to address this as soon as possible and I discussed that with the patient today as well. Fortunately I do not see any signs of active infection locally nor systemically at this time which is great news. No fevers, chills, nausea, vomiting, or diarrhea. 08-13-2022 upon evaluation today patient appears to be doing well currently in regard to his leg ulcers which are actually looking much better. Fortunately there does not appear to be any signs of active infection at this time which is great news. No fevers, chills, nausea, vomiting, or diarrhea. 3/7; patient with predominantly a left medial lower extremity wound as well as several smaller areas and a small area on the right lateral. His Jodie Echevaria came in today. We are using silver alginate as the primary dressing 09-03-2022 upon evaluation today patient appears to be doing well currently in regard to his wounds. He has been tolerating the dressing changes without complication. Fortunately there does not appear to be any signs of infection there is needed however for sharp debridement. 09-10-2022 upon evaluation today patient appears to be doing well currently in regard to his wound. Has been tolerating the dressing changes without complication. Fortunately there does not appear to be any signs of infection looking systemically which is great news and overall I am extremely pleased with where we stand today. I do not see any signs of infection. 09-17-2022 upon evaluation today patient actually is making signs  of improvement the good news is he is making good improvement. With that being said he still is having quite a time keeping the swelling down. We have been using Tubigrip I think that still probably our best option but nonetheless I think that we are making progress with regard to his wounds. 09-24-2022 upon evaluation today patient appears to be doing well currently in regard to his wounds is continuing to make improvements at this point which is great news and overall I am extremely pleased with where we stand today. Fortunately I do not see any evidence of active infection at this time which is great news and in general I think he is doing quite well with the [facility] and silver alginate. RAYE, BICKHART ([id number]) [id number] Page 7 of 10 10-01-2022 upon evaluation today patient appears to be doing pretty well currently in regard to his wounds. He has been tolerating the dressing changes without complication.  Fortunately I do not see any evidence of active infection locally nor systemically which is great news and I do believe that 10-08-2022 upon evaluation today patient's wounds actually are showing some signs of improvement which is great news. Still this is very slow to heal I really feel like he would do better if we can get him in some stronger compression wrapping he does have the juxta lite compression wraps therefore we can actually see about doing that over top of his Tubigrip to see if that can be beneficial. He does not love the hide he had as they are not the most comfortable thing for him but nonetheless I think it would be beneficial he is in agreement with going forward with this. 10-15-2022 upon evaluation today patient appears to be doing well currently in regard to his wounds that do not appear to be infected which is good news. With that being said unfortunately he still continues to have significant amount of swelling we really need to do something to  try to get the swelling under control. I do not see any signs of infection locally nor systemically but at the same time the amount of edema is tremendous. The juxta lite helps but again he is only taking the fluid pills once or twice a week due to the amount that makes him go to the restroom. Therefore he does not take this daily which is really what he needs on a more regular basis. We need to try to get some compression on and is a little bit stronger. 10-22-2022 upon evaluation today patient actually appears to be making some really good progress in regard to his wound. The compression wrap was put on last time actually doing a great job he looks much improved and in general I feel like that we are making great progress. I do not see any evidence of active infection locally nor systemically which is great news. 10-29-2022 upon evaluation today patient appears to be doing decently well in regard to his legs currently. I feel like that his swelling is much better I feel like that he is also doing much better in regard to the wounds in general. I do not see any signs of active infection which is great news and in general I think that he is making excellent progress towards complete resolution. The patient does seem to have 1 issue that is with his wraps actually slipping down working I definitely need to work on that aspect. 11-05-2022 upon evaluation today patient appears to be doing excellent in regard to his wound. He has been tolerating the dressing changes without complication. He actually seems to be making excellent progress I am extremely pleased with where things stand currently. I do believe that we are really making good progress and I think that we are on the right track towards complete closure I think the compression wrap has been a dramatic shift in a good way for him as far as getting the wounds healed a lot of these areas that we will continue to leave Completely sealed up. 11-12-2022  upon evaluation today patient appears to be doing well currently in regard to his leg ulcer. The compression wrapping seems to be doing an excellent job. Fortunately I do not see any evidence of active infection locally nor systemically which is great news and in general I do believe that we are making good headway here towards complete closure. 11-19-2022 upon evaluation today patient appears to be doing well currently in  regard to his wounds. He is actually showing signs of excellent improvement and very pleased with where we stand I think that he is making great progress. I do not see any evidence of infection at this time which is great news. 11-26-2022 upon evaluation today patient appears to be doing well currently in regard to his wound. He is actually been tolerating the dressing changes without complication. Fortunately I do not see any evidence of active infection locally nor systemically which is great news I think he is making excellent progress here towards closure. 12-03-2022 upon evaluation today patient appears to be doing well currently in regard to his wound. He in fact at both locations is doing great and seems to be making excellent progress and very pleased in that regard. I do not see any signs of active infection at this time. 12-10-2022 upon evaluation patient's wound is actually significantly smaller with one of the satellite lesions closed and there is just 1 small area remaining and this is actually doing quite well. I am very pleased with her progress. 12-15-2022 upon evaluation today patient appears to be doing actually pretty well in regard to his wound on the left leg. He does have a little area on the backside that open that was previously close last week due to the fact that his wrap got wet over the weekend and he had to make do with stuff they can find at the drugstore. They made this work out however and the good news is he actually seems to be doing significantly better  and the wound looks fine except for the fact that he has a couple of areas that just reopen on the backside very tiny. Nonetheless I do believe that we can go ahead and see what we do about trying to get this moving in the right direction yet again. 12-22-2022 upon evaluation today patient appears to be doing poorly in regard to his wrap slid down and his leg is extremely swollen. It is also painful secondary to it being so swollen. Fortunately I do not see any evidence of infection right now but at the same time I do believe that if we do not get the swelling under control is not really good to alleviate his discomfort. We definitely can need to change his wrap out however in short order. 12-31-2022 upon evaluation today patient appears to be doing well currently in regard to his wound. He has been tolerating the dressing changes without complication. This actually looks to be doing much better today compared to where we were. Fortunately I do not see any evidence of active infection locally or systemically which is great news. 01-11-2023 upon evaluation today patient appears to be doing well currently in regard to his wound which is actually showing signs of significant improvement. Fortunately I do not see any signs of infection locally or systemically which is great news and in general I do believe that we are making good headway towards complete closure. 01-19-2023 upon evaluation today patient appears to be doing well currently hemoglobin to his wound. He has been tolerating the dressing changes without complication. Fortunately the wound is not infected unfortunately it is a little bigger due to the fact that the wrap slid down. I do not see any signs of worsening or infection at this time. 01-28-2023 upon evaluation today patient appears to be doing well currently in regard to his wound. He has been tolerating the dressing changes without complication. Fortunately I do not see any  signs of infection  the compression wrap is doing a really good job and I think it worked pretty much just about healed. 02-04-2023 upon evaluation today patient appears to be doing excellent in regard to his wounds. In fact he is pretty much completely healed on the left although I think this may have just a pinpoint opening. Nonetheless I think it is 1 more week to toughen up. 02-16-2023 upon evaluation today patient appears to be doing well currently in regard to his wound. He has been tolerating the dressing changes without complication. Fortunately there does not appear to be any evidence of active infection locally or systemically which is good news. In fact the original wounds we been taking care of on the left and right legs are completely healed unfortunately his wrap slipped down the right leg and there are 2 small areas on the anterior portion of his leg which are open at this point. When I wrapped his right leg the left leg is completely closed however he is in agreement to his own compression sock. 02-26-2023 upon evaluation today patient appears to be doing well currently in regard to his left leg which is still close in the right leg is doing significantly better. Fortunately there does not appear to be any signs of active infection locally or systemically which is great news and in general I do believe that we will making headway towards complete closure. 03-05-2023 upon evaluation today patient actually appears to be doing excellent in regard to his wounds one of the areas healed the other is significantly improved. I am actually very pleased with where we stand today and I think that he is doing well with his compression stockings which is excellent as well. 03-19-2023 upon evaluation today patient appears to be doing poorly currently in regard to his wounds. He seems to be doing a bit worse as far as both legs are concerned as far as open wounds and I think that we are going need to address this rapidly to  keep it from backtracking and getting significantly worse. He voiced understanding he kind of somewhat expected I believe. 03-29-2023 upon evaluation patient actually showing signs of definite improvement with regard to his wounds currently with some already drying up and on the LUCIAN, [name] ([id number]) [id number] Page 8 of 10 right lateral leg this is much smaller. Will continue to wrap him this seems to be doing quite well. 04-06-2023 upon evaluation today patient's wounds on the legs actually appear to be doing better which is great news. Fortunately I do not see any evidence of worsening overall and I do believe that the patient is making good headway towards closure which is good news. I do not see any signs of infection which is also excellent. He actually only had the wraps on for a couple of days before they started slipping down he had cut them off and therefore spent the remaining 5 days pretty much just in his compression socks he seems of done very well though. Objective Constitutional Obese and well-hydrated in no acute distress. Vitals Time Taken: 10:40 AM, Height: 73 in, Weight: 358 lbs, BMI: 47.2, Temperature: 97.7 F, Pulse: 81 bpm, Respiratory Rate: 18 breaths/min, Blood Pressure: 98/62 mmHg. Respiratory normal breathing without difficulty. Psychiatric this patient is able to make decisions and demonstrates good insight into disease process. Alert and Oriented x 3. pleasant and cooperative. General Notes: Upon inspection patient's wounds did not require any sharp debridement he seems to be doing quite  well as far as healing is concerned I am actually very pleased with where things stand today and I do not see any signs of infection at this time which is great news as well. Integumentary (Hair, Skin) Wound #3 status is Open. Original cause of wound was Gradually Appeared. The date acquired was: 02/04/2023. The wound has been in treatment 8 weeks.  The wound is located on the Right Lower Leg. The wound measures 0.3cm length x 0.4cm width x 0.1cm depth; 0.094cm^2 area and 0.009cm^3 volume. There is Fat Layer (Subcutaneous Tissue) exposed. There is no tunneling or undermining noted. There is a medium amount of serosanguineous drainage noted. There is medium (34-66%) red granulation within the wound bed. There is a medium (34-66%) amount of necrotic tissue within the wound bed including Adherent Slough. Wound #4 status is Open. Original cause of wound was Gradually Appeared. The date acquired was: 04/06/2023. The wound is located on the Right,Posterior Lower Leg. The wound measures 1.5cm length x 1cm width x 0.1cm depth; 1.178cm^2 area and 0.118cm^3 volume. There is Fat Layer (Subcutaneous Tissue) exposed. There is no tunneling or undermining noted. There is a medium amount of serosanguineous drainage noted. There is medium (34-66%) red, pink granulation within the wound bed. There is a medium (34-66%) amount of necrotic tissue within the wound bed including Adherent Slough. Wound #5 status is Open. Original cause of wound was Gradually Appeared. The date acquired was: 04/06/2023. The wound is located on the Left,Posterior Lower Leg. The wound measures 4.5cm length x 4cm width x 0.1cm depth; 14.137cm^2 area and 1.414cm^3 volume. There is Fat Layer (Subcutaneous Tissue) exposed. There is no tunneling or undermining noted. There is a medium amount of serosanguineous drainage noted. There is medium (34-66%) red, pink granulation within the wound bed. There is a medium (34-66%) amount of necrotic tissue within the wound bed including Adherent Slough. Assessment Active Problems ICD-10 Type 2 diabetes mellitus with other skin ulcer Lymphedema, not elsewhere classified Non-pressure chronic ulcer of other part of left lower leg with fat layer exposed Non-pressure chronic ulcer of other part of right lower leg with fat layer exposed Essential (primary)  hypertension Chronic combined systolic (congestive) and diastolic (congestive) heart failure Cardiac arrhythmia, unspecified Plan Follow-up Appointments: Return Appointment in 1 week. Nurse Visit as needed Bathing/ Shower/ Hygiene: May shower with wound dressing protected with water repellent cover or cast protector. Anesthetic (Use 'Patient Medications' Section for Anesthetic Order Entry): Lidocaine applied to wound bed Edema Control - Lymphedema / Segmental Compressive Device / Other: Elevate, Exercise Daily and Avoid Standing for Long Periods of Time. Elevate legs to the level of the heart and pump ankles as often as possible Elevate leg(s) parallel to the floor when sitting. Compression Pump: Use compression pump on left lower extremity for 60 minutes, twice daily. Compression Pump: Use compression pump on right lower extremity for 60 minutes, twice daily. DO YOUR BEST to sleep in the bed at night. DO NOT sleep in your recliner. Long hours of sitting in a recliner leads to swelling of the legs and/or potential wounds on your backside. EDIZ, NEYLON ([id number]) [id number] Page 9 of 10 Non-Wound Condition: Additional non-wound orders/instructions: - A and D and abd to right heel prior to wrapping WOUND #3: - Lower Leg Wound Laterality: Right Cleanser: Soap and Water 1 x Per Day/30 Days Discharge Instructions: Gently cleanse wound with antibacterial soap, rinse and pat dry prior to dressing wounds Prim Dressing: Silvercel Small 2x2 (in/in) 1 x Per Day/30  Days ary Discharge Instructions: Apply Silvercel Small 2x2 (in/in) as instructed Secondary Dressing: Coverlet Latex-Free Fabric Adhesive Dressings 1 x Per Day/30 Days Discharge Instructions: 1.5 x 2 WOUND #4: - Lower Leg Wound Laterality: Right, Posterior Cleanser: Soap and Water 1 x Per Day/30 Days Discharge Instructions: Gently cleanse wound with antibacterial soap, rinse and pat dry prior to  dressing wounds Prim Dressing: Silvercel Small 2x2 (in/in) 1 x Per Day/30 Days ary Discharge Instructions: Apply Silvercel Small 2x2 (in/in) as instructed Secondary Dressing: Coverlet Latex-Free Fabric Adhesive Dressings 1 x Per Day/30 Days Discharge Instructions: 1.5 x 2 WOUND #5: - Lower Leg Wound Laterality: Left, Posterior Cleanser: Soap and Water 1 x Per Day/30 Days Discharge Instructions: Gently cleanse wound with antibacterial soap, rinse and pat dry prior to dressing wounds Prim Dressing: Silvercel Small 2x2 (in/in) 1 x Per Day/30 Days ary Discharge Instructions: Apply Silvercel Small 2x2 (in/in) as instructed Secondary Dressing: Coverlet Latex-Free Fabric Adhesive Dressings 1 x Per Day/30 Days Discharge Instructions: 1.5 x 2 1. I am going to recommend based on what we are seeing that we have the patient continue actually with his compression socks I think he is doing quite well when he does wear them I think he might of gotten into a stent where he was not actually wearing them on a regular basis which was part of the problem. 2. I am going to recommend as well that the patient should elevate his legs much as possible to help with edema control. 3. I am also going to recommend that he should continue with the silver alginate dressing. We will see patient back for reevaluation in 1 week here in the clinic. If anything worsens or changes patient will contact our office for additional recommendations. Electronic Signature(s) Signed: 04/06/2023 1:38:27 PM By: Allen Derry PA-C Entered By: Allen Derry on 04/06/2023 13:38:27 -------------------------------------------------------------------------------- SuperBill Details Patient Name: Date of Service: [name], [name] 04/06/2023 Medical Record Number: [id number] Patient Account Number: [id number] Date of Birth/Sex: Treating RN: 16-Oct-1955 (67 y.o. [name] Primary Care Provider: [name] Other Clinician: Referring  Provider: Treating Provider/Extender: Matthew Folks in Treatment: 37 Diagnosis Coding ICD-10 Codes Code Description E11.622 Type 2 diabetes mellitus with other skin ulcer I89.0 Lymphedema, not elsewhere classified L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 L97.812 Non-pressure chronic ulcer of other part of right lower leg with fat layer exposed I10 Essential (primary) hypertension I50.42 Chronic combined systolic (congestive) and diastolic (congestive) heart failure I49.9 Cardiac arrhythmia, unspecified Facility Procedures : ZAMAURI, SHERTZER Code: [id number] Fabio Neighbors ([id number]) Description: 99214 - WOUND CARE VISIT-LEV 4 EST PT [phone] Modifier: [id number] Pag Quantity: 1 e 10 of 10 Physician Procedures : CPT4 Code Description Modifier [phone] 99213 - WC PHYS LEVEL 3 - EST PT ICD-10 Diagnosis Description E11.622 Type 2 diabetes mellitus with other skin ulcer I89.0 Lymphedema, not elsewhere classified L97.822 Non-pressure chronic ulcer of other part of  left lower leg with fat layer exposed L97.812 Non-pressure chronic ulcer of other part of right lower leg with fat layer exposed Quantity: 1 Electronic Signature(s) Signed: 04/06/2023 1:40:41 PM By: Allen Derry PA-C Previous Signature: 04/06/2023 12:26:50 PM Version By: Angelina Pih Entered By: Allen Derry on 04/06/2023 13:40:41

## 2023-04-08 NOTE — Progress Notes (Signed)
GUERINO, MARCZAK ([id number]) [id number] Page 1 of 12 Visit Report for 04/06/2023 Arrival Information Details Patient Name: Date of Service: [name] 04/06/2023 10:30 A M Medical Record Number: [id number] Patient Account Number: [id number] Date of Birth/Sex: Treating RN: [birth year] (67 y.o. [name] Primary Care Braley Luckenbaugh: [name] Other Clinician: Referring Xeng Kucher: Treating Ngai Parcell/Extender: Matthew Folks in Treatment: 37 Visit Information History Since Last Visit Added or deleted any medications: No Patient Arrived: [name] Any new allergies or adverse reactions: No Arrival Time: 10:47 Had a fall or experienced change in No Accompanied By: self activities of daily living that may affect Transfer Assistance: None risk of falls: Patient Identification Verified: Yes Hospitalized since last visit: No Secondary Verification Process Completed: Yes Has Dressing in Place as Prescribed: Yes Patient Requires Transmission-Based Precautions: No Has Compression in Place as Prescribed: Yes Patient Has Alerts: No Pain Present Now: Yes Electronic Signature(s) Signed: 04/06/2023 10:48:01 AM By: Angelina Pih Entered By: Angelina Pih on 04/06/2023 07:48:01 -------------------------------------------------------------------------------- Clinic Level of Care Assessment Details Patient Name: Date of Service: [name] 04/06/2023 10:30 A M Medical Record Number: [id number] Patient Account Number: [id number] Date of Birth/Sex: Treating RN: 04-26-1956 (67 y.o. [name] Primary Care Giomar Gusler: [name] Other Clinician: Referring Oliviah Agostini: Treating Samanthia Howland/Extender: Matthew Folks in Treatment: 37 Clinic Level of Care Assessment Items TOOL 4 Quantity Score []  - 0 Use when only an EandM is performed on FOLLOW-UP visit ASSESSMENTS - Nursing Assessment / Reassessment X- 1  10 Reassessment of Co-morbidities (includes updates in patient status) X- 1 5 Reassessment of Adherence to Treatment Plan ASSESSMENTS - Wound and Skin A ssessment / Reassessment []  - 0 Simple Wound Assessment / Reassessment - one wound [name], [name] ([id number]) [phone].pdf Page 2 of 12 X- 3 5 Complex Wound Assessment / Reassessment - multiple wounds []  - 0 Dermatologic / Skin Assessment (not related to wound area) ASSESSMENTS - Focused Assessment X- 1 5 Circumferential Edema Measurements - multi extremities []  - 0 Nutritional Assessment / Counseling / Intervention []  - 0 Lower Extremity Assessment (monofilament, tuning fork, pulses) []  - 0 Peripheral Arterial Disease Assessment (using hand held doppler) ASSESSMENTS - Ostomy and/or Continence Assessment and Care []  - 0 Incontinence Assessment and Management []  - 0 Ostomy Care Assessment and Management (repouching, etc.) PROCESS - Coordination of Care X - Simple Patient / Family Education for ongoing care 1 15 []  - 0 Complex (extensive) Patient / Family Education for ongoing care X- 1 10 Staff obtains Chiropractor, Records, T Results / Process Orders est []  - 0 Staff telephones HHA, Nursing Homes / Clarify orders / etc []  - 0 Routine Transfer to another Facility (non-emergent condition) []  - 0 Routine Hospital Admission (non-emergent condition) []  - 0 New Admissions / Manufacturing engineer / Ordering NPWT Apligraf, etc. , []  - 0 Emergency Hospital Admission (emergent condition) X- 1 10 Simple Discharge Coordination []  - 0 Complex (extensive) Discharge Coordination PROCESS - Special Needs []  - 0 Pediatric / Minor Patient Management []  - 0 Isolation Patient Management []  - 0 Hearing / Language / Visual special needs []  - 0 Assessment of Community assistance (transportation, D/C planning, etc.) []  - 0 Additional assistance / Altered mentation []  - 0 Support Surface(s) Assessment (bed,  cushion, seat, etc.) INTERVENTIONS - Wound Cleansing / Measurement []  - 0 Simple Wound Cleansing - one wound X- 3 5 Complex Wound Cleansing - multiple wounds X- 1 5 Wound Imaging (photographs -  any number of wounds) []  - 0 Wound Tracing (instead of photographs) []  - 0 Simple Wound Measurement - one wound X- 3 5 Complex Wound Measurement - multiple wounds INTERVENTIONS - Wound Dressings X - Small Wound Dressing one or multiple wounds 3 10 []  - 0 Medium Wound Dressing one or multiple wounds []  - 0 Large Wound Dressing one or multiple wounds X- 1 5 Application of Medications - topical []  - 0 Application of Medications - injection INTERVENTIONS - Miscellaneous []  - 0 External ear exam []  - 0 Specimen Collection (cultures, biopsies, blood, body fluids, etc.) []  - 0 Specimen(s) / Culture(s) sent or taken to Lab for analysis [name], [name] ([id number]) [phone].pdf Page 3 of 12 []  - 0 Patient Transfer (multiple staff / Nurse, adult / Similar devices) []  - 0 Simple Staple / Suture removal (25 or less) []  - 0 Complex Staple / Suture removal (26 or more) []  - 0 Hypo / Hyperglycemic Management (close monitor of Blood Glucose) []  - 0 Ankle / Brachial Index (ABI) - do not check if billed separately X- 1 5 Vital Signs Has the patient been seen at the hospital within the last three years: Yes Total Score: 145 Level Of Care: New/Established - Level 4 Electronic Signature(s) Signed: 04/08/2023 4:44:43 PM By: Angelina Pih Entered By: Angelina Pih on 04/06/2023 09:26:37 -------------------------------------------------------------------------------- Encounter Discharge Information Details Patient Name: Date of Service: Matthew Burly [name] 04/06/2023 10:30 A M Medical Record Number: [id number] Patient Account Number: [id number] Date of Birth/Sex: Treating RN: 09/25/1955 (67 y.o. [name] Primary Care Denzil Mceachron: [name] Other  Clinician: Referring Afreen Siebels: Treating Micki Cassel/Extender: Matthew Folks in Treatment: 37 Encounter Discharge Information Items Discharge Condition: Stable Ambulatory Status: Cane Discharge Destination: Home Transportation: Private Auto Accompanied By: self Schedule Follow-up Appointment: Yes Clinical Summary of Care: Electronic Signature(s) Signed: 04/06/2023 12:27:45 PM By: Angelina Pih Entered By: Angelina Pih on 04/06/2023 09:27:45 -------------------------------------------------------------------------------- Lower Extremity Assessment Details Patient Name: Date of Service: [name] 04/06/2023 10:30 A M Medical Record Number: [id number] Patient Account Number: [id number] Date of Birth/Sex: Treating RN: 1955/07/18 (66 y.o. [name] Primary Care Ugochi Henzler: [name] Other Clinician: SHAAN, [name] ([id number]) [id number] Page 4 of 12 Referring Treanna Dumler: Treating Takeria Marquina/Extender: Matthew Folks in Treatment: 37 Edema Assessment Assessed: [Left: No] [Right: No] Edema: [Left: Yes] [Right: Yes] Calf Left: Right: Point of Measurement: 36 cm From Medial Instep 55.4 cm 58.7 cm Ankle Left: Right: Point of Measurement: 13 cm From Medial Instep 29.5 cm 30.2 cm Vascular Assessment Pulses: Dorsalis Pedis Palpable: [Left:Yes] [Right:Yes] Extremity colors, hair growth, and conditions: Extremity Color: [Left:Hyperpigmented] [Right:Hyperpigmented] Hair Growth on Extremity: [Left:No] [Right:No] Temperature of Extremity: [Left:Warm < 3 seconds] [Right:Warm < 3 seconds] Toe Nail Assessment Left: Right: Thick: Yes Yes Discolored: No No Deformed: No No Improper Length and Hygiene: No No Electronic Signature(s) Signed: 04/08/2023 4:44:43 PM By: Angelina Pih Entered By: Angelina Pih on 04/06/2023  07:45:25 -------------------------------------------------------------------------------- Multi Wound Chart Details Patient Name: Date of Service: Matthew Burly [name] 04/06/2023 10:30 A M Medical Record Number: [id number] Patient Account Number: [id number] Date of Birth/Sex: Treating RN: 02-12-1956 (67 y.o. [name] Primary Care Amarion Portell: [name] Other Clinician: Referring Jalecia Leon: Treating Lyanne Kates/Extender: Matthew Folks in Treatment: 37 Vital Signs Height(in): 73 Pulse(bpm): 81 Weight(lbs): 358 Blood Pressure(mmHg): 98/62 Body Mass Index(BMI): 47.2 Temperature(F): 97.7 Respiratory Rate(breaths/min): 18 [3:Photos:] [[id number] Page 5 of 12] Right Lower Leg Right, Posterior Lower  Leg Left, Posterior Lower Leg Wound Location: Gradually Appeared Gradually Appeared Gradually Appeared Wounding Event: Lymphedema Lymphedema Lymphedema Primary Etiology: Arrhythmia, Congestive Heart Failure, Arrhythmia, Congestive Heart Failure, Arrhythmia, Congestive Heart Failure, Comorbid History: Hypertension, Type II Diabetes Hypertension, Type II Diabetes Hypertension, Type II Diabetes 02/04/2023 04/06/2023 04/06/2023 Date Acquired: 8 0 0 Weeks of Treatment: Open Open Open Wound Status: No No No Wound Recurrence: 0.3x0.4x0.1 1.5x1x0.1 4.5x4x0.1 Measurements L x W x D (cm) 0.094 1.178 14.137 A (cm) : rea 0.009 0.118 1.414 Volume (cm) : 92.30% N/A N/A % Reduction in Area: 96.30% N/A N/A % Reduction in Volume: Partial Thickness Partial Thickness Full Thickness Without Exposed Classification: Support Structures Medium Medium Medium Exudate Amount: Serosanguineous Serosanguineous Serosanguineous Exudate Type: red, brown red, brown red, brown Exudate Color: Medium (34-66%) Medium (34-66%) Medium (34-66%) Granulation Amount: Red Red, Pink Red, Pink Granulation Quality: Medium (34-66%) Medium (34-66%) Medium  (34-66%) Necrotic Amount: Fat Layer (Subcutaneous Tissue): Yes Fat Layer (Subcutaneous Tissue): Yes Fat Layer (Subcutaneous Tissue): Yes Exposed Structures: Fascia: No Tendon: No Muscle: No Joint: No Bone: No Small (1-33%) None None Epithelialization: Treatment Notes Electronic Signature(s) Signed: 04/06/2023 12:25:14 PM By: Angelina Pih Entered By: Angelina Pih on 04/06/2023 09:25:14 -------------------------------------------------------------------------------- Multi-Disciplinary Care Plan Details Patient Name: Date of Service: Matthew Burly [name] 04/06/2023 10:30 A M Medical Record Number: [id number] Patient Account Number: [id number] Date of Birth/Sex: Treating RN: [birth year] (67 y.o. 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 (use after diagnosis is confirmed) Goals: Patient will maintain optimal edema control Date Initiated: 10/15/2022 Target Resolution Date: 04/15/2023 [name], [name] ([id number]) [phone].pdf Page 6 of 12 Goal Status: Active Patient/caregiver will verbalize understanding of disease process and disease management Date Initiated: 10/15/2022 Date Inactivated: 11/12/2022 Target Resolution Date: 10/15/2022 Goal Status: Met Verify adequate tissue perfusion prior to therapeutic compression application Date Initiated: 10/15/2022 Date Inactivated: 11/12/2022 Target Resolution Date: 10/15/2022 Goal Status: Met Interventions: Assess peripheral edema status every visit. Compression as ordered Provide education on venous insufficiency Treatment Activities: Therapeutic compression applied : 10/15/2022 Notes: Electronic Signature(s) Signed: 04/06/2023 12:26:57 PM By: Angelina Pih Entered By: Angelina Pih on 04/06/2023  09:26:57 -------------------------------------------------------------------------------- Pain Assessment Details Patient Name: Date of Service: Matthew Burly [name] 04/06/2023 10:30 A M Medical Record Number: [id number] Patient Account Number: [id number] Date of Birth/Sex: Treating RN: 07-04-1955 (67 y.o. [name] Primary Care Lagina Reader: [name] Other Clinician: Referring Adriel Kessen: Treating Tripton Ned/Extender: Matthew Folks in Treatment: 37 Active Problems Location of Pain Severity and Description of Pain Patient Has Paino Yes Site Locations Rate the pain. Current Pain Level: 5 Pain Management and Medication Current Pain Management: Notes pain in back [name], [name] ([id number]) [phone].pdf Page 7 of 12 Electronic Signature(s) Signed: 04/06/2023 10:48:27 AM By: Angelina Pih Entered By: Angelina Pih on 04/06/2023 07:48:27 -------------------------------------------------------------------------------- Patient/Caregiver Education Details Patient Name: Date of Service: [name] 10/22/2024andnbsp10:30 A M Medical Record Number: [id number] Patient Account Number: [id number] Date of Birth/Gender: Treating RN: 24-Apr-1956 (67 y.o. [name] Primary Care Physician: [name] Other Clinician: Referring Physician: Treating Physician/Extender: Matthew Folks in Treatment: 37 Education Assessment Education Provided To: Patient Education Topics Provided Wound/Skin Impairment: Handouts: Caring for Your Ulcer Methods: Explain/Verbal Responses: State content correctly Electronic Signature(s) Signed: 04/08/2023 4:44:43 PM By: Angelina Pih Entered By: Angelina Pih on 04/06/2023 09:27:07 -------------------------------------------------------------------------------- Wound  Assessment Details Patient Name: Date of Service: [name] 04/06/2023 10:30 A M Medical  Record Number: [id number] Patient Account Number: [id number] Date of Birth/Sex: Treating RN: 03/29/1956 (67 y.o. [name] Primary Care Taeshaun Rames: [name] Other Clinician: Referring Kimbree Casanas: Treating Jonnie Truxillo/Extender: Matthew Folks in Treatment: 37 Wound Status Wound Number: 3 Primary Lymphedema Etiology: Wound Location: Right Lower Leg Wound Status: Open Wounding Event: Gradually Appeared Comorbid Arrhythmia, Congestive Heart Failure, Hypertension, Type II Date Acquired: 02/04/2023 History: Diabetes Weeks Of Treatment: 8 Clustered Wound: No AMMIE, [name] ([id number]) [phone].pdf Page 8 of 12 Photos Wound Measurements Length: (cm) 0.3 Width: (cm) 0.4 Depth: (cm) 0.1 Area: (cm) 0.094 Volume: (cm) 0.009 % Reduction in Area: 92.3% % Reduction in Volume: 96.3% Epithelialization: Small (1-33%) Tunneling: No Undermining: No Wound Description Classification: Partial Thickness Exudate Amount: Medium Exudate Type: Serosanguineous Exudate Color: red, brown Foul Odor After Cleansing: No Slough/Fibrino Yes Wound Bed Granulation Amount: Medium (34-66%) Exposed Structure Granulation Quality: Red Fascia Exposed: No Necrotic Amount: Medium (34-66%) Fat Layer (Subcutaneous Tissue) Exposed: Yes Necrotic Quality: Adherent Slough Tendon Exposed: No Muscle Exposed: No Joint Exposed: No Bone Exposed: No Treatment Notes Wound #3 (Lower Leg) Wound Laterality: Right Cleanser Soap and Water Discharge Instruction: Gently cleanse wound with antibacterial soap, rinse and pat dry prior to dressing wounds Peri-Wound Care Topical Primary Dressing Silvercel Small 2x2 (in/in) Discharge Instruction: Apply Silvercel Small 2x2 (in/in) as instructed Secondary Dressing Coverlet Latex-Free Fabric Adhesive Dressings Discharge Instruction: 1.5 x 2 Secured With Compression Wrap Compression Stockings Add-Ons Electronic  Signature(s) Signed: 04/08/2023 4:44:43 PM By: Angelina Pih Entered By: Angelina Pih on 04/06/2023 07:41:32 [name] ([id number]) [id number] Page 9 of 12 -------------------------------------------------------------------------------- Wound Assessment Details Patient Name: Date of Service: [name] 04/06/2023 10:30 A M Medical Record Number: [id number] Patient Account Number: [id number] Date of Birth/Sex: Treating RN: [birth year] (67 y.o. [name] Primary Care Tyrese Capriotti: [name] Other Clinician: Referring Jeovani Weisenburger: Treating Omkar Stratmann/Extender: Matthew Folks in Treatment: 37 Wound Status Wound Number: 4 Primary Lymphedema Etiology: Wound Location: Right, Posterior Lower Leg Wound Status: Open Wounding Event: Gradually Appeared Comorbid Arrhythmia, Congestive Heart Failure, Hypertension, Type II Date Acquired: 04/06/2023 History: Diabetes Weeks Of Treatment: 0 Clustered Wound: No Photos Wound Measurements Length: (cm) 1.5 Width: (cm) 1 Depth: (cm) 0.1 Area: (cm) 1.178 Volume: (cm) 0.118 % Reduction in Area: % Reduction in Volume: Epithelialization: None Tunneling: No Undermining: No Wound Description Classification: Partial Thickness Exudate Amount: Medium Exudate Type: Serosanguineous Exudate Color: red, brown Foul Odor After Cleansing: No Slough/Fibrino Yes Wound Bed Granulation Amount: Medium (34-66%) Exposed Structure Granulation Quality: Red, Pink Fat Layer (Subcutaneous Tissue) Exposed: Yes Necrotic Amount: Medium (34-66%) Necrotic Quality: Adherent Slough Treatment Notes Wound #4 (Lower Leg) Wound Laterality: Right, Posterior Cleanser Soap and Water Discharge Instruction: Gently cleanse wound with antibacterial soap, rinse and pat dry prior to dressing wounds Peri-Wound Care Topical [name], [name] ([id number]) [phone].pdf Page 10 of 12 Primary  Dressing Silvercel Small 2x2 (in/in) Discharge Instruction: Apply Silvercel Small 2x2 (in/in) as instructed Secondary Dressing Coverlet Latex-Free Fabric Adhesive Dressings Discharge Instruction: 1.5 x 2 Secured With Compression Wrap Compression Stockings Add-Ons Electronic Signature(s) Signed: 04/08/2023 4:44:43 PM By: Angelina Pih Entered By: Angelina Pih on 04/06/2023 07:43:55 -------------------------------------------------------------------------------- Wound Assessment Details Patient Name: Date of Service: Matthew Burly [name] 04/06/2023 10:30 A M Medical Record Number: [id number] Patient Account Number: [id number] Date of Birth/Sex: Treating RN: 01/22/1956 (67 y.o. [name]  Primary Care Angeligue Bowne: [name] Other Clinician: Referring Krystle Polcyn: Treating Jayvien Rowlette/Extender: Matthew Folks in Treatment: 37 Wound Status Wound Number: 5 Primary Lymphedema Etiology: Wound Location: Left, Posterior Lower Leg Wound Status: Open Wounding Event: Gradually Appeared Comorbid Arrhythmia, Congestive Heart Failure, Hypertension, Type II Date Acquired: 04/06/2023 History: Diabetes Weeks Of Treatment: 0 Clustered Wound: No Photos Wound Measurements Length: (cm) 4.5 Width: (cm) 4 Depth: (cm) 0.1 Area: (cm) 14.137 Volume: (cm) 1.414 % Reduction in Area: % Reduction in Volume: Epithelialization: None Tunneling: No Undermining: No Wound Description Classification: Full Thickness Without Exposed Support Structures Exudate Amount: Medium [name], [name] ([id number]) Exudate Type: Serosanguineous Exudate Color: red, brown Foul Odor After Cleansing: No Slough/Fibrino Yes [id number].pdf Page 11 of 12 Wound Bed Granulation Amount: Medium (34-66%) Exposed Structure Granulation Quality: Red, Pink Fat Layer (Subcutaneous Tissue) Exposed: Yes Necrotic Amount: Medium (34-66%) Necrotic Quality: Adherent Slough Treatment  Notes Wound #5 (Lower Leg) Wound Laterality: Left, Posterior Cleanser Soap and Water Discharge Instruction: Gently cleanse wound with antibacterial soap, rinse and pat dry prior to dressing wounds Peri-Wound Care Topical Primary Dressing Silvercel Small 2x2 (in/in) Discharge Instruction: Apply Silvercel Small 2x2 (in/in) as instructed Secondary Dressing Coverlet Latex-Free Fabric Adhesive Dressings Discharge Instruction: 1.5 x 2 Secured With Compression Wrap Compression Stockings Add-Ons Electronic Signature(s) Signed: 04/08/2023 4:44:43 PM By: Angelina Pih Entered By: Angelina Pih on 04/06/2023 07:43:31 -------------------------------------------------------------------------------- Vitals Details Patient Name: Date of Service: Matthew Burly [name] 04/06/2023 10:30 A M Medical Record Number: [id number] Patient Account Number: [id number] Date of Birth/Sex: Treating RN: [birth year] (67 y.o. [name] Primary Care Tevon Berhane: [name] Other Clinician: Referring Brinsley Wence: Treating Bobby Ragan/Extender: Matthew Folks in Treatment: 37 Vital Signs Time Taken: 10:40 Temperature (F): 97.7 Height (in): 73 Pulse (bpm): 81 Weight (lbs): 358 Respiratory Rate (breaths/min): 18 Body Mass Index (BMI): 47.2 Blood Pressure (mmHg): 98/62 Reference Range: 80 - 120 mg / dl Electronic Signature(s) Signed: 04/06/2023 10:48:16 AM By: Angelina Pih Entered By: Angelina Pih on 04/06/2023 07:48:16 [name] ([id number]) [id number] Page 12 of 12

## 2023-04-13 ENCOUNTER — Encounter: Payer: PPO | Admitting: Physician Assistant

## 2023-04-13 DIAGNOSIS — E11622 Type 2 diabetes mellitus with other skin ulcer: Secondary | ICD-10-CM | POA: Diagnosis not present

## 2023-04-13 DIAGNOSIS — I890 Lymphedema, not elsewhere classified: Secondary | ICD-10-CM | POA: Diagnosis not present

## 2023-04-13 DIAGNOSIS — L97812 Non-pressure chronic ulcer of other part of right lower leg with fat layer exposed: Secondary | ICD-10-CM | POA: Diagnosis not present

## 2023-04-13 NOTE — Progress Notes (Signed)
ANDREN, WAITKUS ([id number]) [id number] Page 1 of 9 Visit Report for 04/13/2023 Chief Complaint Document Details Patient Name: Date of Service: [name] 04/13/2023 10:00 A M Medical Record Number: [id number] Patient Account Number: [id number] Date of Birth/Sex: Treating RN: 20-Sep-1955 (67 y.o. [name] Primary Care Provider: [name] Other Clinician: [name] Referring Provider: Treating Provider/Extender: Matthew Folks in Treatment: 38 Information Obtained from: Patient Chief Complaint Bilateral LE ulcers and lymphedema Electronic Signature(s) Signed: 04/13/2023 10:13:38 AM By: Allen Derry PA-C Entered By: Allen Derry on 04/13/2023 10:13:38 -------------------------------------------------------------------------------- HPI Details Patient Name: Date of Service: Matthew TTO, [name] 04/13/2023 10:00 A M Medical Record Number: [id number] Patient Account Number: [id number] Date of Birth/Sex: Treating RN: 1956/03/20 (67 y.o. [name] Primary Care Provider: [name] Other Clinician: [name] Referring Provider: Treating Provider/Extender: Matthew Folks in Treatment: 38 History of Present Illness HPI Description: 07-16-2022 upon evaluation today patient appears to be doing poorly currently in regard to his his legs. The left leg is actually worse than the right. This is a patient that is previously been seen in the [place] office and at that point was doing really quite well with compression wrapping and often alginate are either Hydrofera Blue dressings. Fortunately he has gone since November 2020 until just current with out having any issue or need for wound care services which is great news. Unfortunately he is here today because he is having some issues at this point. Patient does have a history of several medical conditions which include diabetes mellitus type 2,  lymphedema, hypertension, congestive heart failure, and cardiac arrhythmia though is not sure that this is actually atrial fibrillation based on what he has been told. He does have lymphedema pumps but tells me he has not used them since the summer when he had to move to a remodel of his building and subsequently never unpacked them. 07-23-2022 upon evaluation today patient appears to be doing well with regard to his legs. He is going require some sharp debridement today but overall seems to be making some progress. I am pleased in that regard he has much less drainage that he had did last week I think the compression wraps are definitely helping. 07-30-2022 upon evaluation today patient appears to be doing poorly in regard to his legs he actually has blue-green drainage which I think is consistent with Pseudomonas. I believe that we need to address this as soon as possible and I discussed that with the patient today as well. Fortunately I do not see any signs of active infection locally nor systemically at this time which is great news. No fevers, chills, nausea, vomiting, or diarrhea. [name], [name] ([id number]) [id number] Page 2 of 9 08-13-2022 upon evaluation today patient appears to be doing well currently in regard to his leg ulcers which are actually looking much better. Fortunately there does not appear to be any signs of active infection at this time which is great news. No fevers, chills, nausea, vomiting, or diarrhea. 3/7; patient with predominantly a left medial lower extremity wound as well as several smaller areas and a small area on the right lateral. His [name] came in today. We are using silver alginate as the primary dressing 09-03-2022 upon evaluation today patient appears to be doing well currently in regard to his wounds. He has been tolerating the dressing changes without complication. Fortunately there does not appear to be any signs of infection there  is  needed however for sharp debridement. 09-10-2022 upon evaluation today patient appears to be doing well currently in regard to his wound. Has been tolerating the dressing changes without complication. Fortunately there does not appear to be any signs of infection looking systemically which is great news and overall I am extremely pleased with where we stand today. I do not see any signs of infection. 09-17-2022 upon evaluation today patient actually is making signs of improvement the good news is he is making good improvement. With that being said he still is having quite a time keeping the swelling down. We have been using Tubigrip I think that still probably our best option but nonetheless I think that we are making progress with regard to his wounds. 09-24-2022 upon evaluation today patient appears to be doing well currently in regard to his wounds is continuing to make improvements at this point which is great news and overall I am extremely pleased with where we stand today. Fortunately I do not see any evidence of active infection at this time which is great news and in general I think he is doing quite well with the [facility] and silver alginate. 10-01-2022 upon evaluation today patient appears to be doing pretty well currently in regard to his wounds. He has been tolerating the dressing changes without complication. Fortunately I do not see any evidence of active infection locally nor systemically which is great news and I do believe that 10-08-2022 upon evaluation today patient's wounds actually are showing some signs of improvement which is great news. Still this is very slow to heal I really feel like he would do better if we can get him in some stronger compression wrapping he does have the juxta lite compression wraps therefore we can actually see about doing that over top of his Tubigrip to see if that can be beneficial. He does not love the hide he had as they are not the most comfortable  thing for him but nonetheless I think it would be beneficial he is in agreement with going forward with this. 10-15-2022 upon evaluation today patient appears to be doing well currently in regard to his wounds that do not appear to be infected which is good news. With that being said unfortunately he still continues to have significant amount of swelling we really need to do something to try to get the swelling under control. I do not see any signs of infection locally nor systemically but at the same time the amount of edema is tremendous. The juxta lite helps but again he is only taking the fluid pills once or twice a week due to the amount that makes him go to the restroom. Therefore he does not take this daily which is really what he needs on a more regular basis. We need to try to get some compression on and is a little bit stronger. 10-22-2022 upon evaluation today patient actually appears to be making some really good progress in regard to his wound. The compression wrap was put on last time actually doing a great job he looks much improved and in general I feel like that we are making great progress. I do not see any evidence of active infection locally nor systemically which is great news. 10-29-2022 upon evaluation today patient appears to be doing decently well in regard to his legs currently. I feel like that his swelling is much better I feel like that he is also doing much better in regard to the wounds in general. I  do not see any signs of active infection which is great news and in general I think that he is making excellent progress towards complete resolution. The patient does seem to have 1 issue that is with his wraps actually slipping down working I definitely need to work on that aspect. 11-05-2022 upon evaluation today patient appears to be doing excellent in regard to his wound. He has been tolerating the dressing changes without complication. He actually seems to be making excellent  progress I am extremely pleased with where things stand currently. I do believe that we are really making good progress and I think that we are on the right track towards complete closure I think the compression wrap has been a dramatic shift in a good way for him as far as getting the wounds healed a lot of these areas that we will continue to leave Completely sealed up. 11-12-2022 upon evaluation today patient appears to be doing well currently in regard to his leg ulcer. The compression wrapping seems to be doing an excellent job. Fortunately I do not see any evidence of active infection locally nor systemically which is great news and in general I do believe that we are making good headway here towards complete closure. 11-19-2022 upon evaluation today patient appears to be doing well currently in regard to his wounds. He is actually showing signs of excellent improvement and very pleased with where we stand I think that he is making great progress. I do not see any evidence of infection at this time which is great news. 11-26-2022 upon evaluation today patient appears to be doing well currently in regard to his wound. He is actually been tolerating the dressing changes without complication. Fortunately I do not see any evidence of active infection locally nor systemically which is great news I think he is making excellent progress here towards closure. 12-03-2022 upon evaluation today patient appears to be doing well currently in regard to his wound. He in fact at both locations is doing great and seems to be making excellent progress and very pleased in that regard. I do not see any signs of active infection at this time. 12-10-2022 upon evaluation patient's wound is actually significantly smaller with one of the satellite lesions closed and there is just 1 small area remaining and this is actually doing quite well. I am very pleased with her progress. 12-15-2022 upon evaluation today patient appears to  be doing actually pretty well in regard to his wound on the left leg. He does have a little area on the backside that open that was previously close last week due to the fact that his wrap got wet over the weekend and he had to make do with stuff they can find at the drugstore. They made this work out however and the good news is he actually seems to be doing significantly better and the wound looks fine except for the fact that he has a couple of areas that just reopen on the backside very tiny. Nonetheless I do believe that we can go ahead and see what we do about trying to get this moving in the right direction yet again. 12-22-2022 upon evaluation today patient appears to be doing poorly in regard to his wrap slid down and his leg is extremely swollen. It is also painful secondary to it being so swollen. Fortunately I do not see any evidence of infection right now but at the same time I do believe that if we do not get  the swelling under control is not really good to alleviate his discomfort. We definitely can need to change his wrap out however in short order. 12-31-2022 upon evaluation today patient appears to be doing well currently in regard to his wound. He has been tolerating the dressing changes without complication. This actually looks to be doing much better today compared to where we were. Fortunately I do not see any evidence of active infection locally or systemically which is great news. 01-11-2023 upon evaluation today patient appears to be doing well currently in regard to his wound which is actually showing signs of significant improvement. Fortunately I do not see any signs of infection locally or systemically which is great news and in general I do believe that we are making good headway towards complete closure. 01-19-2023 upon evaluation today patient appears to be doing well currently hemoglobin to his wound. He has been tolerating the dressing changes without complication.  Fortunately the wound is not infected unfortunately it is a little bigger due to the fact that the wrap slid down. I do not see any signs of worsening or infection at this time. 01-28-2023 upon evaluation today patient appears to be doing well currently in regard to his wound. He has been tolerating the dressing changes without complication. Fortunately I do not see any signs of infection the compression wrap is doing a really good job and I think it worked pretty much just about healed. LADERRION, PLUCINSKI ([id number]) [id number] Page 3 of 9 02-04-2023 upon evaluation today patient appears to be doing excellent in regard to his wounds. In fact he is pretty much completely healed on the left although I think this may have just a pinpoint opening. Nonetheless I think it is 1 more week to toughen up. 02-16-2023 upon evaluation today patient appears to be doing well currently in regard to his wound. He has been tolerating the dressing changes without complication. Fortunately there does not appear to be any evidence of active infection locally or systemically which is good news. In fact the original wounds we been taking care of on the left and right legs are completely healed unfortunately his wrap slipped down the right leg and there are 2 small areas on the anterior portion of his leg which are open at this point. When I wrapped his right leg the left leg is completely closed however he is in agreement to his own compression sock. 02-26-2023 upon evaluation today patient appears to be doing well currently in regard to his left leg which is still close in the right leg is doing significantly better. Fortunately there does not appear to be any signs of active infection locally or systemically which is great news and in general I do believe that we will making headway towards complete closure. 03-05-2023 upon evaluation today patient actually appears to be doing excellent in regard to  his wounds one of the areas healed the other is significantly improved. I am actually very pleased with where we stand today and I think that he is doing well with his compression stockings which is excellent as well. 03-19-2023 upon evaluation today patient appears to be doing poorly currently in regard to his wounds. He seems to be doing a bit worse as far as both legs are concerned as far as open wounds and I think that we are going need to address this rapidly to keep it from backtracking and getting significantly worse. He voiced understanding he kind of somewhat expected I believe. 03-29-2023  upon evaluation patient actually showing signs of definite improvement with regard to his wounds currently with some already drying up and on the right lateral leg this is much smaller. Will continue to wrap him this seems to be doing quite well. 04-06-2023 upon evaluation today patient's wounds on the legs actually appear to be doing better which is great news. Fortunately I do not see any evidence of worsening overall and I do believe that the patient is making good headway towards closure which is good news. I do not see any signs of infection which is also excellent. He actually only had the wraps on for a couple of days before they started slipping down he had cut them off and therefore spent the remaining 5 days pretty much just in his compression socks he seems of done very well though. 04-13-2023 upon evaluation patient is down to just 1 wound on the right leg everything appears to be doing excellent I think he is making excellent progress towards complete closure. Fortunately I do not see any signs of active infection locally or systemically which is great news. Electronic Signature(s) Signed: 04/13/2023 11:10:59 AM By: Allen Derry PA-C Entered By: Allen Derry on 04/13/2023 11:10:58 -------------------------------------------------------------------------------- Physical Exam Details Patient  Name: Date of Service: [name] 04/13/2023 10:00 A M Medical Record Number: [id number] Patient Account Number: [id number] Date of Birth/Sex: Treating RN: 01-Nov-1955 (67 y.o. [name] Primary Care Provider: [name] Other Clinician: [name] Referring Provider: Treating Provider/Extender: Matthew Folks in Treatment: 38 Constitutional Obese and well-hydrated in no acute distress. Respiratory normal breathing without difficulty. Psychiatric this patient is able to make decisions and demonstrates good insight into disease process. Alert and Oriented x 3. pleasant and cooperative. Notes Upon inspection patient's wound bed actually showed signs of good granulation and epithelization at this point. Fortunately I think that well with his compression socks and is also utilizing the he is doing extremely silver alginate dressing with a Band-Aid over the open areas. He has done extremely well over the past week. Electronic Signature(s) Signed: 04/13/2023 11:11:15 AM By: Allen Derry PA-C Entered By: Allen Derry on 04/13/2023 11:11:14 [name] ([id number]) [id number] Page 4 of 9 -------------------------------------------------------------------------------- Physician Orders Details Patient Name: Date of Service: [name] 04/13/2023 10:00 A M Medical Record Number: [id number] Patient Account Number: [id number] Date of Birth/Sex: Treating RN: 11-19-1955 (67 y.o. [name] Primary Care Provider: [name] Other Clinician: [name] Referring Provider: Treating Provider/Extender: Matthew Folks in Treatment: 38 The following information was scribed by: [name] The information was scribed for: Allen Derry Verbal / Phone Orders: No Diagnosis Coding ICD-10 Coding Code Description E11.622 Type 2 diabetes mellitus with other skin ulcer I89.0 Lymphedema, not  elsewhere classified L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 L97.812 Non-pressure chronic ulcer of other part of right lower leg with fat layer exposed I10 Essential (primary) hypertension I50.42 Chronic combined systolic (congestive) and diastolic (congestive) heart failure I49.9 Cardiac arrhythmia, unspecified Follow-up Appointments Return Appointment in 1 week. Nurse Visit as needed Bathing/ Shower/ Hygiene May shower with wound dressing protected with water repellent cover or cast protector. Anesthetic (Use 'Patient Medications' Section for Anesthetic Order Entry) Lidocaine applied to wound bed Edema Control - Orders / Instructions Patient to wear own compression stockings. Remove compression stockings every night before going to bed and put on every morning when getting up. Elevate,  Exercise Daily and A void Standing for Long Periods of Time. Elevate leg(s) parallel to the floor when sitting. DO YOUR BEST to sleep in the bed at night. DO NOT sleep in your recliner. Long hours of sitting in a recliner leads to swelling of the legs and/or potential wounds on your backside. Non-Wound Condition Right Lower Extremity dditional non-wound orders/instructions: - A and D and abd to right heel A Wound Treatment Wound #3 - Lower Leg Wound Laterality: Right Cleanser: Soap and Water 1 x Per Day/30 Days Discharge Instructions: Gently cleanse wound with antibacterial soap, rinse and pat dry prior to dressing wounds Prim Dressing: Silvercel Small 2x2 (in/in) 1 x Per Day/30 Days ary Discharge Instructions: Apply Silvercel Small 2x2 (in/in) as instructed Secondary Dressing: Coverlet Latex-Free Fabric Adhesive Dressings 1 x Per Day/30 Days Discharge Instructions: 1.5 x 2 Electronic Signature(s) Signed: 04/13/2023 5:26:09 PM By: [name] Signed: 04/13/2023 6:10:41 PM By: Allen Derry PA-C Entered By: [name] on 04/13/2023  10:53:13 [name] ([id number]) [id number] Page 5 of 9 -------------------------------------------------------------------------------- Problem List Details Patient Name: Date of Service: [name] 04/13/2023 10:00 A M Medical Record Number: [id number] Patient Account Number: [id number] Date of Birth/Sex: Treating RN: 1956-02-03 (67 y.o. [name] Primary Care Provider: [name] Other Clinician: [name] Referring Provider: Treating Provider/Extender: Matthew Folks in Treatment: 38 Active Problems ICD-10 Encounter Code Description Active Date MDM Diagnosis E11.622 Type 2 diabetes mellitus with other skin ulcer 07/16/2022 No Yes I89.0 Lymphedema, not elsewhere classified 07/16/2022 No Yes L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2/06/2022 No Yes L97.812 Non-pressure chronic ulcer of other part of right lower leg with fat layer 07/16/2022 No Yes exposed I10 Essential (primary) hypertension 07/16/2022 No Yes I50.42 Chronic combined systolic (congestive) and diastolic (congestive) heart failure 07/16/2022 No Yes I49.9 Cardiac arrhythmia, unspecified 07/16/2022 No Yes Inactive Problems Resolved Problems Electronic Signature(s) Signed: 04/13/2023 10:13:35 AM By: Allen Derry PA-C Entered By: Allen Derry on 04/13/2023 10:13:34 [name] ([id number]) [id number] Page 6 of 9 -------------------------------------------------------------------------------- Progress Note Details Patient Name: Date of Service: [name] 04/13/2023 10:00 A M Medical Record Number: [id number] Patient Account Number: [id number] Date of Birth/Sex: Treating RN: 01-26-1956 (67 y.o. [name] Primary Care Provider: [name] Other Clinician: [name] Referring Provider: Treating Provider/Extender: Matthew Folks in  Treatment: 38 Subjective Chief Complaint Information obtained from Patient Bilateral LE ulcers and lymphedema History of Present Illness (HPI) 07-16-2022 upon evaluation today patient appears to be doing poorly currently in regard to his his legs. The left leg is actually worse than the right. This is a patient that is previously been seen in the [place] office and at that point was doing really quite well with compression wrapping and often alginate are either Hydrofera Blue dressings. Fortunately he has gone since November 2020 until just current with out having any issue or need for wound care services which is great news. Unfortunately he is here today because he is having some issues at this point. Patient does have a history of several medical conditions which include diabetes mellitus type 2, lymphedema, hypertension, congestive heart failure, and cardiac arrhythmia though is not sure that this is actually atrial fibrillation based on what he has been told. He does have lymphedema pumps but tells me he has not used them since the summer when he had to move to a remodel of his building  and subsequently never unpacked them. 07-23-2022 upon evaluation today patient appears to be doing well with regard to his legs. He is going require some sharp debridement today but overall seems to be making some progress. I am pleased in that regard he has much less drainage that he had did last week I think the compression wraps are definitely helping. 07-30-2022 upon evaluation today patient appears to be doing poorly in regard to his legs he actually has blue-green drainage which I think is consistent with Pseudomonas. I believe that we need to address this as soon as possible and I discussed that with the patient today as well. Fortunately I do not see any signs of active infection locally nor systemically at this time which is great news. No fevers, chills, nausea, vomiting, or diarrhea. 08-13-2022 upon  evaluation today patient appears to be doing well currently in regard to his leg ulcers which are actually looking much better. Fortunately there does not appear to be any signs of active infection at this time which is great news. No fevers, chills, nausea, vomiting, or diarrhea. 3/7; patient with predominantly a left medial lower extremity wound as well as several smaller areas and a small area on the right lateral. His [name] came in today. We are using silver alginate as the primary dressing 09-03-2022 upon evaluation today patient appears to be doing well currently in regard to his wounds. He has been tolerating the dressing changes without complication. Fortunately there does not appear to be any signs of infection there is needed however for sharp debridement. 09-10-2022 upon evaluation today patient appears to be doing well currently in regard to his wound. Has been tolerating the dressing changes without complication. Fortunately there does not appear to be any signs of infection looking systemically which is great news and overall I am extremely pleased with where we stand today. I do not see any signs of infection. 09-17-2022 upon evaluation today patient actually is making signs of improvement the good news is he is making good improvement. With that being said he still is having quite a time keeping the swelling down. We have been using Tubigrip I think that still probably our best option but nonetheless I think that we are making progress with regard to his wounds. 09-24-2022 upon evaluation today patient appears to be doing well currently in regard to his wounds is continuing to make improvements at this point which is great news and overall I am extremely pleased with where we stand today. Fortunately I do not see any evidence of active infection at this time which is great news and in general I think he is doing quite well with the [facility] and silver alginate. 10-01-2022 upon evaluation  today patient appears to be doing pretty well currently in regard to his wounds. He has been tolerating the dressing changes without complication. Fortunately I do not see any evidence of active infection locally nor systemically which is great news and I do believe that 10-08-2022 upon evaluation today patient's wounds actually are showing some signs of improvement which is great news. Still this is very slow to heal I really feel like he would do better if we can get him in some stronger compression wrapping he does have the juxta lite compression wraps therefore we can actually see about doing that over top of his Tubigrip to see if that can be beneficial. He does not love the hide he had as they are not the most comfortable thing for him but  nonetheless I think it would be beneficial he is in agreement with going forward with this. 10-15-2022 upon evaluation today patient appears to be doing well currently in regard to his wounds that do not appear to be infected which is good news. With that being said unfortunately he still continues to have significant amount of swelling we really need to do something to try to get the swelling under control. I do not see any signs of infection locally nor systemically but at the same time the amount of edema is tremendous. The juxta lite helps but again he is only taking the fluid pills once or twice a week due to the amount that makes him go to the restroom. Therefore he does not take this daily which is really what he needs on a more regular basis. We need to try to get some compression on and is a little bit stronger. 10-22-2022 upon evaluation today patient actually appears to be making some really good progress in regard to his wound. The compression wrap was put on last time actually doing a great job he looks much improved and in general I feel like that we are making great progress. I do not see any evidence of active infection locally nor systemically which  is great news. 10-29-2022 upon evaluation today patient appears to be doing decently well in regard to his legs currently. I feel like that his swelling is much better I feel like that he is also doing much better in regard to the wounds in general. I do not see any signs of active infection which is great news and in general I think that he DAMARION, STEVESON ([id number]) [id number] Page 7 of 9 is making excellent progress towards complete resolution. The patient does seem to have 1 issue that is with his wraps actually slipping down working I definitely need to work on that aspect. 11-05-2022 upon evaluation today patient appears to be doing excellent in regard to his wound. He has been tolerating the dressing changes without complication. He actually seems to be making excellent progress I am extremely pleased with where things stand currently. I do believe that we are really making good progress and I think that we are on the right track towards complete closure I think the compression wrap has been a dramatic shift in a good way for him as far as getting the wounds healed a lot of these areas that we will continue to leave Completely sealed up. 11-12-2022 upon evaluation today patient appears to be doing well currently in regard to his leg ulcer. The compression wrapping seems to be doing an excellent job. Fortunately I do not see any evidence of active infection locally nor systemically which is great news and in general I do believe that we are making good headway here towards complete closure. 11-19-2022 upon evaluation today patient appears to be doing well currently in regard to his wounds. He is actually showing signs of excellent improvement and very pleased with where we stand I think that he is making great progress. I do not see any evidence of infection at this time which is great news. 11-26-2022 upon evaluation today patient appears to be doing well currently in  regard to his wound. He is actually been tolerating the dressing changes without complication. Fortunately I do not see any evidence of active infection locally nor systemically which is great news I think he is making excellent progress here towards closure. 12-03-2022 upon evaluation today patient appears to  be doing well currently in regard to his wound. He in fact at both locations is doing great and seems to be making excellent progress and very pleased in that regard. I do not see any signs of active infection at this time. 12-10-2022 upon evaluation patient's wound is actually significantly smaller with one of the satellite lesions closed and there is just 1 small area remaining and this is actually doing quite well. I am very pleased with her progress. 12-15-2022 upon evaluation today patient appears to be doing actually pretty well in regard to his wound on the left leg. He does have a little area on the backside that open that was previously close last week due to the fact that his wrap got wet over the weekend and he had to make do with stuff they can find at the drugstore. They made this work out however and the good news is he actually seems to be doing significantly better and the wound looks fine except for the fact that he has a couple of areas that just reopen on the backside very tiny. Nonetheless I do believe that we can go ahead and see what we do about trying to get this moving in the right direction yet again. 12-22-2022 upon evaluation today patient appears to be doing poorly in regard to his wrap slid down and his leg is extremely swollen. It is also painful secondary to it being so swollen. Fortunately I do not see any evidence of infection right now but at the same time I do believe that if we do not get the swelling under control is not really good to alleviate his discomfort. We definitely can need to change his wrap out however in short order. 12-31-2022 upon evaluation today  patient appears to be doing well currently in regard to his wound. He has been tolerating the dressing changes without complication. This actually looks to be doing much better today compared to where we were. Fortunately I do not see any evidence of active infection locally or systemically which is great news. 01-11-2023 upon evaluation today patient appears to be doing well currently in regard to his wound which is actually showing signs of significant improvement. Fortunately I do not see any signs of infection locally or systemically which is great news and in general I do believe that we are making good headway towards complete closure. 01-19-2023 upon evaluation today patient appears to be doing well currently hemoglobin to his wound. He has been tolerating the dressing changes without complication. Fortunately the wound is not infected unfortunately it is a little bigger due to the fact that the wrap slid down. I do not see any signs of worsening or infection at this time. 01-28-2023 upon evaluation today patient appears to be doing well currently in regard to his wound. He has been tolerating the dressing changes without complication. Fortunately I do not see any signs of infection the compression wrap is doing a really good job and I think it worked pretty much just about healed. 02-04-2023 upon evaluation today patient appears to be doing excellent in regard to his wounds. In fact he is pretty much completely healed on the left although I think this may have just a pinpoint opening. Nonetheless I think it is 1 more week to toughen up. 02-16-2023 upon evaluation today patient appears to be doing well currently in regard to his wound. He has been tolerating the dressing changes without complication. Fortunately there does not appear to be any  evidence of active infection locally or systemically which is good news. In fact the original wounds we been taking care of on the left and right legs are  completely healed unfortunately his wrap slipped down the right leg and there are 2 small areas on the anterior portion of his leg which are open at this point. When I wrapped his right leg the left leg is completely closed however he is in agreement to his own compression sock. 02-26-2023 upon evaluation today patient appears to be doing well currently in regard to his left leg which is still close in the right leg is doing significantly better. Fortunately there does not appear to be any signs of active infection locally or systemically which is great news and in general I do believe that we will making headway towards complete closure. 03-05-2023 upon evaluation today patient actually appears to be doing excellent in regard to his wounds one of the areas healed the other is significantly improved. I am actually very pleased with where we stand today and I think that he is doing well with his compression stockings which is excellent as well. 03-19-2023 upon evaluation today patient appears to be doing poorly currently in regard to his wounds. He seems to be doing a bit worse as far as both legs are concerned as far as open wounds and I think that we are going need to address this rapidly to keep it from backtracking and getting significantly worse. He voiced understanding he kind of somewhat expected I believe. 03-29-2023 upon evaluation patient actually showing signs of definite improvement with regard to his wounds currently with some already drying up and on the right lateral leg this is much smaller. Will continue to wrap him this seems to be doing quite well. 04-06-2023 upon evaluation today patient's wounds on the legs actually appear to be doing better which is great news. Fortunately I do not see any evidence of worsening overall and I do believe that the patient is making good headway towards closure which is good news. I do not see any signs of infection which is also excellent. He actually  only had the wraps on for a couple of days before they started slipping down he had cut them off and therefore spent the remaining 5 days pretty much just in his compression socks he seems of done very well though. 04-13-2023 upon evaluation patient is down to just 1 wound on the right leg everything appears to be doing excellent I think he is making excellent progress towards complete closure. Fortunately I do not see any signs of active infection locally or systemically which is great news. [id number] ERMIL, SAXMAN ([id number]) [id number] Page 8 of 9 Constitutional Obese and well-hydrated in no acute distress. Vitals Time Taken: 9:57 AM, Height: 73 in, Weight: 358 lbs, BMI: 47.2, Temperature: 97.9 F, Pulse: 76 bpm, Respiratory Rate: 18 breaths/min, Blood Pressure: 99/62 mmHg. Respiratory normal breathing without difficulty. Psychiatric this patient is able to make decisions and demonstrates good insight into disease process. Alert and Oriented x 3. pleasant and cooperative. General Notes: Upon inspection patient's wound bed actually showed signs of good granulation and epithelization at this point. Fortunately I think that well with his compression socks and is also utilizing the he is doing extremely silver alginate dressing with a Band-Aid over the open areas. He has done extremely well over the past week. Integumentary (Hair, Skin) Wound #3 status is Open. Original cause of wound was Gradually Appeared. The date acquired  was: 02/04/2023. The wound has been in treatment 9 weeks. The wound is located on the Right Lower Leg. The wound measures 0.5cm length x 0.5cm width x 0.1cm depth; 0.196cm^2 area and 0.02cm^3 volume. There is Fat Layer (Subcutaneous Tissue) exposed. There is a medium amount of serosanguineous drainage noted. There is medium (34-66%) red granulation within the wound bed. There is a medium (34-66%) amount of necrotic tissue within the wound bed  including Adherent Slough. Wound #4 status is Healed - Epithelialized. Original cause of wound was Gradually Appeared. The date acquired was: 04/06/2023. The wound has been in treatment 1 weeks. The wound is located on the Right,Posterior Lower Leg. The wound measures 0cm length x 0cm width x 0cm depth; 0cm^2 area and 0cm^3 volume. There is Fat Layer (Subcutaneous Tissue) exposed. There is a none present amount of drainage noted. There is no granulation within the wound bed. There is no necrotic tissue within the wound bed. Wound #5 status is Healed - Epithelialized. Original cause of wound was Gradually Appeared. The date acquired was: 04/06/2023. The wound has been in treatment 1 weeks. The wound is located on the Left,Posterior Lower Leg. The wound measures 0cm length x 0cm width x 0cm depth; 0cm^2 area and 0cm^3 volume. There is Fat Layer (Subcutaneous Tissue) exposed. There is a none present amount of drainage noted. There is no granulation within the wound bed. There is no necrotic tissue within the wound bed. Assessment Active Problems ICD-10 Type 2 diabetes mellitus with other skin ulcer Lymphedema, not elsewhere classified Non-pressure chronic ulcer of other part of left lower leg with fat layer exposed Non-pressure chronic ulcer of other part of right lower leg with fat layer exposed Essential (primary) hypertension Chronic combined systolic (congestive) and diastolic (congestive) heart failure Cardiac arrhythmia, unspecified Plan Follow-up Appointments: Return Appointment in 1 week. Nurse Visit as needed Bathing/ Shower/ Hygiene: May shower with wound dressing protected with water repellent cover or cast protector. Anesthetic (Use 'Patient Medications' Section for Anesthetic Order Entry): Lidocaine applied to wound bed Edema Control - Orders / Instructions: Patient to wear own compression stockings. Remove compression stockings every night before going to bed and put on every  morning when getting up. Elevate, Exercise Daily and Avoid Standing for Long Periods of Time. Elevate leg(s) parallel to the floor when sitting. DO YOUR BEST to sleep in the bed at night. DO NOT sleep in your recliner. Long hours of sitting in a recliner leads to swelling of the legs and/or potential wounds on your backside. Non-Wound Condition: Additional non-wound orders/instructions: - A and D and abd to right heel WOUND #3: - Lower Leg Wound Laterality: Right Cleanser: Soap and Water 1 x Per Day/30 Days Discharge Instructions: Gently cleanse wound with antibacterial soap, rinse and pat dry prior to dressing wounds Prim Dressing: Silvercel Small 2x2 (in/in) 1 x Per Day/30 Days ary Discharge Instructions: Apply Silvercel Small 2x2 (in/in) as instructed Secondary Dressing: Coverlet Latex-Free Fabric Adhesive Dressings 1 x Per Day/30 Days Discharge Instructions: 1.5 x 2 1. I would recommend based on what we are seeing that we have the patient going continue with the silver alginate dressing with a Band-Aid to the right leg open ulcer. 2. He will continue with his own compression socks which seem to be doing excellent. 3. I am also can recommend he should continue to use his lymphedema pumps which I think is crucial and keeping his legs elevated is much as he can which also is crucial. Beddow, Irish J (  [id number]) [id number] Page 9 of 9 We will see patient back for reevaluation in 1 week here in the clinic. If anything worsens or changes patient will contact our office for additional recommendations. Electronic Signature(s) Signed: 04/13/2023 11:11:44 AM By: Allen Derry PA-C Entered By: Allen Derry on 04/13/2023 11:11:44 -------------------------------------------------------------------------------- SuperBill Details Patient Name: Date of Service: Matthew TTO, [name] 04/13/2023 Medical Record Number: [id number] Patient Account Number: [id number] Date of  Birth/Sex: Treating RN: 09-Mar-1956 (67 y.o. [name] Primary Care Provider: [name] Other Clinician: [name] Referring Provider: Treating Provider/Extender: Matthew Folks in Treatment: 38 Diagnosis Coding ICD-10 Codes Code Description E11.622 Type 2 diabetes mellitus with other skin ulcer I89.0 Lymphedema, not elsewhere classified L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 L97.812 Non-pressure chronic ulcer of other part of right lower leg with fat layer exposed I10 Essential (primary) hypertension I50.42 Chronic combined systolic (congestive) and diastolic (congestive) heart failure I49.9 Cardiac arrhythmia, unspecified Facility Procedures : CPT4 Code: [id number] Description: 99213 - WOUND CARE VISIT-LEV 3 EST PT Modifier: Quantity: 1 Physician Procedures : CPT4 Code Description Modifier [id number] 99213 - WC PHYS LEVEL 3 - EST PT ICD-10 Diagnosis Description E11.622 Type 2 diabetes mellitus with other skin ulcer I89.0 Lymphedema, not elsewhere classified L97.822 Non-pressure chronic ulcer of other part of  left lower leg with fat layer exposed L97.812 Non-pressure chronic ulcer of other part of right lower leg with fat layer exposed Quantity: 1 Electronic Signature(s) Signed: 04/13/2023 11:11:57 AM By: Allen Derry PA-C Entered By: Allen Derry on 04/13/2023 11:11:57

## 2023-04-15 NOTE — Progress Notes (Signed)
DORA, PRAH ([id number]) [id number] Page 1 of 11 Visit Report for 04/13/2023 Arrival Information Details Patient Name: Date of Service: [name] 04/13/2023 10:00 A M Medical Record Number: [id number] Patient Account Number: [id number] Date of Birth/Sex: Treating RN: Aug 12, 1955 (67 y.o. [name] Primary Care [name]: [name] Other Clinician: [name] Referring Mirissa Lopresti: Treating Dewanda Fennema/Extender: Matthew Folks in Treatment: 38 Visit Information History Since Last Visit All ordered tests and consults were completed: No Patient Arrived: [name] Added or deleted any medications: No Arrival Time: 09:50 Any new allergies or adverse reactions: No Transfer Assistance: None Had a fall or experienced change in No Patient Identification Verified: Yes activities of daily living that may affect Secondary Verification Process Completed: Yes risk of falls: Patient Requires Transmission-Based Precautions: No Signs or symptoms of abuse/neglect since last visito No Patient Has Alerts: No Hospitalized since last visit: No Implantable device outside of the clinic excluding No cellular tissue based products placed in the center since last visit: Has Dressing in Place as Prescribed: Yes Has Compression in Place as Prescribed: Yes Pain Present Now: No Electronic Signature(s) Signed: 04/13/2023 5:26:09 PM By: [name] Entered By: [name] on 04/13/2023 09:56:12 -------------------------------------------------------------------------------- Clinic Level of Care Assessment Details Patient Name: Date of Service: [name] 04/13/2023 10:00 A M Medical Record Number: [id number] Patient Account Number: [id number] Date of Birth/Sex: Treating RN: December 02, 1955 (67 y.o. [name] Primary Care Derald Lorge: [name] Other Clinician: [name] Referring Jameire Kouba: Treating Francile Woolford/Extender:  Matthew Folks in Treatment: 38 Clinic Level of Care Assessment Items TOOL 4 Quantity Score []  - 0 Use when only an EandM is performed on FOLLOW-UP visit ASSESSMENTS - Nursing Assessment / Reassessment X- 1 10 Reassessment of Co-morbidities (includes updates in patient status) [name] ([id number]) [phone].pdf Page 2 of 11 X- 1 5 Reassessment of Adherence to Treatment Plan ASSESSMENTS - Wound and Skin A ssessment / Reassessment []  - 0 Simple Wound Assessment / Reassessment - one wound X- 3 5 Complex Wound Assessment / Reassessment - multiple wounds []  - 0 Dermatologic / Skin Assessment (not related to wound area) ASSESSMENTS - Focused Assessment X- 1 5 Circumferential Edema Measurements - multi extremities []  - 0 Nutritional Assessment / Counseling / Intervention []  - 0 Lower Extremity Assessment (monofilament, tuning fork, pulses) []  - 0 Peripheral Arterial Disease Assessment (using hand held doppler) ASSESSMENTS - Ostomy and/or Continence Assessment and Care []  - 0 Incontinence Assessment and Management []  - 0 Ostomy Care Assessment and Management (repouching, etc.) PROCESS - Coordination of Care X - Simple Patient / Family Education for ongoing care 1 15 []  - 0 Complex (extensive) Patient / Family Education for ongoing care []  - 0 Staff obtains Chiropractor, Records, T Results / Process Orders est []  - 0 Staff telephones HHA, Nursing Homes / Clarify orders / etc []  - 0 Routine Transfer to another Facility (non-emergent condition) []  - 0 Routine Hospital Admission (non-emergent condition) []  - 0 New Admissions / Manufacturing engineer / Ordering NPWT Apligraf, etc. , []  - 0 Emergency Hospital Admission (emergent condition) X- 1 10 Simple Discharge Coordination []  - 0 Complex (extensive) Discharge Coordination PROCESS - Special Needs []  - 0 Pediatric / Minor Patient Management []  - 0 Isolation Patient  Management []  - 0 Hearing / Language / Visual special needs []  - 0 Assessment of Community assistance (transportation, D/C planning, etc.) []  - 0 Additional assistance / Altered mentation []  - 0 Support  Surface(s) Assessment (bed, cushion, seat, etc.) INTERVENTIONS - Wound Cleansing / Measurement X - Simple Wound Cleansing - one wound 1 5 []  - 0 Complex Wound Cleansing - multiple wounds X- 1 5 Wound Imaging (photographs - any number of wounds) []  - 0 Wound Tracing (instead of photographs) X- 1 5 Simple Wound Measurement - one wound []  - 0 Complex Wound Measurement - multiple wounds INTERVENTIONS - Wound Dressings X - Small Wound Dressing one or multiple wounds 1 10 []  - 0 Medium Wound Dressing one or multiple wounds []  - 0 Large Wound Dressing one or multiple wounds []  - 0 Application of Medications - topical []  - 0 Application of Medications - injection INTERVENTIONS - Miscellaneous ABOUBACAR, [name] ([id number]) [id number] Page 3 of 11 []  - 0 External ear exam []  - 0 Specimen Collection (cultures, biopsies, blood, body fluids, etc.) []  - 0 Specimen(s) / Culture(s) sent or taken to Lab for analysis []  - 0 Patient Transfer (multiple staff / Michiel Sites Lift / Similar devices) []  - 0 Simple Staple / Suture removal (25 or less) []  - 0 Complex Staple / Suture removal (26 or more) []  - 0 Hypo / Hyperglycemic Management (close monitor of Blood Glucose) []  - 0 Ankle / Brachial Index (ABI) - do not check if billed separately X- 1 5 Vital Signs Has the patient been seen at the hospital within the last three years: Yes Total Score: 90 Level Of Care: New/Established - Level 3 Electronic Signature(s) Signed: 04/13/2023 5:26:09 PM By: [name] Entered By: [name] on 04/13/2023 10:53:48 -------------------------------------------------------------------------------- Encounter Discharge Information Details Patient Name: Date of Service: Matthew Holm, [name] 04/13/2023 10:00 A M Medical Record Number: [id number] Patient Account Number: [id number] Date of Birth/Sex: Treating RN: 03-Sep-1955 (67 y.o. [name] Primary Care Zyanne Schumm: [name] Other Clinician: [name] Referring Tracie Dore: Treating Miyoshi Ligas/Extender: Matthew Folks in Treatment: 38 Encounter Discharge Information Items Discharge Condition: Stable Ambulatory Status: Cane Discharge Destination: Home Transportation: Private Auto Accompanied By: self Schedule Follow-up Appointment: Yes Clinical Summary of Care: Electronic Signature(s) Signed: 04/13/2023 5:26:09 PM By: [name] Entered By: [name] on 04/13/2023 11:03:03 [name] ([id number]) [id number] Page 4 of 11 -------------------------------------------------------------------------------- Lower Extremity Assessment Details Patient Name: Date of Service: [name] 04/13/2023 10:00 A M Medical Record Number: [id number] Patient Account Number: [id number] Date of Birth/Sex: Treating RN: 1956/05/03 (67 y.o. [name] Primary Care Jelan Batterton: [name] Other Clinician: [name] Referring Teghan Philbin: Treating Rayli Wiederhold/Extender: Matthew Folks in Treatment: 38 Edema Assessment Assessed: Kyra Searles: Yes] [name]: Yes] Edema: [Left: Yes] [Right: Yes] Calf Left: Right: Point of Measurement: 36 cm From Medial Instep 56.8 cm 65.5 cm Ankle Left: Right: Point of Measurement: 13 cm From Medial Instep 30.7 cm 31.4 cm Vascular Assessment Pulses: Dorsalis Pedis Palpable: [Left:Yes] [Right:Yes] Extremity colors, hair growth, and conditions: Extremity Color: [Left:Normal] [Right:Normal] Hair Growth on Extremity: [Left:Yes] [Right:Yes] Temperature of Extremity: [Left:Warm < 3 seconds] [Right:Warm < 3 seconds] Toe Nail Assessment Left: Right: Thick: No No Discolored: No No Deformed: No  No Improper Length and Hygiene: No No Electronic Signature(s) Signed: 04/13/2023 5:26:09 PM By: [name] Signed: 04/14/2023 5:05:03 PM By: Midge Aver MSN RN CNS WTA Entered By: [name] on 04/13/2023 10:10:40 -------------------------------------------------------------------------------- Multi Wound Chart Details Patient Name: Date of Service: [name] 04/13/2023 10:00 A M Medical Record Number: [id number] Patient Account Number: [id number] Date of Birth/Sex: Treating RN: [birth year] (67 y.o. M)  Midge Aver Primary Care Omar Gayden: [name] Other Clinician: [name] Referring Josselin Gaulin: Treating Aadan Chenier/Extender: Matthew Folks in Treatment: 100 Vital Signs Height(in): 73 Pulse(bpm): 76 Weight(lbs): 358 Blood Pressure(mmHg): 99/62 Body Mass Index(BMI): 47.2 Temperature(F): 97.9 TIRRELL, LUELLEN ([id number]) [id number] Page 5 of 11 Respiratory Rate(breaths/min): 18 [3:Photos:] Right Lower Leg Right, Posterior Lower Leg Left, Posterior Lower Leg Wound Location: Gradually Appeared Gradually Appeared Gradually Appeared Wounding Event: Lymphedema Lymphedema Lymphedema Primary Etiology: Arrhythmia, Congestive Heart Failure, Arrhythmia, Congestive Heart Failure, Arrhythmia, Congestive Heart Failure, Comorbid History: Hypertension, Type II Diabetes Hypertension, Type II Diabetes Hypertension, Type II Diabetes 02/04/2023 04/06/2023 04/06/2023 Date Acquired: 9 1 1  Weeks of Treatment: Open Open Open Wound Status: No No No Wound Recurrence: 0.5x0.5x0.1 0.1x0.1x0.1 0.1x0.1x0.1 Measurements L x W x D (cm) 0.196 0.008 0.008 A (cm) : rea 0.02 0.001 0.001 Volume (cm) : 84.00% 99.30% 99.90% % Reduction in Area: 91.80% 99.20% 99.90% % Reduction in Volume: Partial Thickness Partial Thickness Full Thickness Without Exposed Classification: Support Structures Medium Medium Medium Exudate  Amount: Serosanguineous Serosanguineous Serosanguineous Exudate Type: red, brown red, brown red, brown Exudate Color: Medium (34-66%) Medium (34-66%) Medium (34-66%) Granulation Amount: Red Red, Pink Red, Pink Granulation Quality: Medium (34-66%) Medium (34-66%) Medium (34-66%) Necrotic Amount: Fat Layer (Subcutaneous Tissue): Yes Fat Layer (Subcutaneous Tissue): Yes Fat Layer (Subcutaneous Tissue): Yes Exposed Structures: Fascia: No Tendon: No Muscle: No Joint: No Bone: No Small (1-33%) None None Epithelialization: Treatment Notes Electronic Signature(s) Signed: 04/13/2023 5:26:09 PM By: [name] Entered By: [name] on 04/13/2023 10:10:49 -------------------------------------------------------------------------------- Multi-Disciplinary Care Plan Details Patient Name: Date of Service: [name] 04/13/2023 10:00 A M Medical Record Number: [id number] Patient Account Number: [id number] Date of Birth/Sex: Treating RN: 1956-05-22 (67 y.o. [name] Primary Care Daiden Coltrane: [name] Other Clinician: [name] Referring Rosamund Nyland: Treating Adell Koval/Extender: Matthew Folks in Treatment: 38 Active Inactive Venous Leg Ulcer TRIP, BUTCHER ([id number]) [phone].pdf Page 6 of 11 Nursing Diagnoses: Actual venous Insuffiency (use after diagnosis is confirmed) Goals: Patient will maintain optimal edema control Date Initiated: 10/15/2022 Target Resolution Date: 04/15/2023 Goal Status: Active Patient/caregiver will verbalize understanding of disease process and disease management Date Initiated: 10/15/2022 Date Inactivated: 11/12/2022 Target Resolution Date: 10/15/2022 Goal Status: Met Verify adequate tissue perfusion prior to therapeutic compression application Date Initiated: 10/15/2022 Date Inactivated: 11/12/2022 Target Resolution Date: 10/15/2022 Goal Status: Met Interventions: Assess peripheral edema  status every visit. Compression as ordered Provide education on venous insufficiency Treatment Activities: Therapeutic compression applied : 10/15/2022 Notes: Electronic Signature(s) Signed: 04/13/2023 5:26:09 PM By: [name] Signed: 04/14/2023 5:05:03 PM By: Midge Aver MSN RN CNS WTA Entered By: [name] on 04/13/2023 10:54:05 -------------------------------------------------------------------------------- Pain Assessment Details Patient Name: Date of Service: [name] 04/13/2023 10:00 A M Medical Record Number: [id number] Patient Account Number: [id number] Date of Birth/Sex: Treating RN: 05/28/1956 (67 y.o. [name] Primary Care Tram Wrenn: [name] Other Clinician: [name] Referring Nickholas Goldston: Treating Cresta Riden/Extender: Matthew Folks in Treatment: 38 Active Problems Location of Pain Severity and Description of Pain Patient Has Paino No Site Locations KEIMONI, SKUSE ([id number]) [id number] Page 7 of 11 Pain Management and Medication Current Pain Management: Electronic Signature(s) Signed: 04/13/2023 5:26:09 PM By: [name] Signed: 04/14/2023 5:05:03 PM By: Midge Aver MSN RN CNS WTA Entered By: [name] on 04/13/2023 10:05:04 -------------------------------------------------------------------------------- Patient/Caregiver Education Details Patient Name: Date of Service: [name] 10/29/2024andnbsp10:00 A M Medical Record Number: [id number]  Patient Account Number: [id number] Date of Birth/Gender: Treating RN: May 09, 1956 (67 y.o. [name] Primary Care Physician: [name] Other Clinician: [name] Referring Physician: Treating Physician/Extender: Matthew Folks in Treatment: 46 Education Assessment Education Provided To: Patient Education Topics Provided Wound/Skin Impairment: Handouts: Other: continue wound care as  directed Methods: Explain/Verbal Responses: State content correctly Electronic Signature(s) Signed: 04/13/2023 5:26:09 PM By: [name] Entered By: [name] on 04/13/2023 10:54:28 -------------------------------------------------------------------------------- Wound Assessment Details Patient Name: Date of Service: [name] 04/13/2023 10:00 A M Medical Record Number: [id number] Patient Account Number: [id number] Date of Birth/Sex: Treating RN: March 28, 1956 (67 y.o. [name] Primary Care Gerod Caligiuri: [name] Other Clinician: [name] Referring Ashlynd Michna: Treating Jasmaine Rochel/Extender: Matthew Folks in Treatment: [address] GABEL, RAMIERZ ([id number]) [id number] Page 8 of 11 Wound Number: 3 Primary Lymphedema Etiology: Wound Location: Right Lower Leg Wound Status: Open Wounding Event: Gradually Appeared Comorbid Arrhythmia, Congestive Heart Failure, Hypertension, Type II Date Acquired: 02/04/2023 History: Diabetes Weeks Of Treatment: 9 Clustered Wound: No Photos Wound Measurements Length: (cm) 0.5 Width: (cm) 0.5 Depth: (cm) 0.1 Area: (cm) 0.196 Volume: (cm) 0.02 % Reduction in Area: 84% % Reduction in Volume: 91.8% Epithelialization: Small (1-33%) Wound Description Classification: Partial Thickness Exudate Amount: Medium Exudate Type: Serosanguineous Exudate Color: red, brown Foul Odor After Cleansing: No Slough/Fibrino Yes Wound Bed Granulation Amount: Medium (34-66%) Exposed Structure Granulation Quality: Red Fascia Exposed: No Necrotic Amount: Medium (34-66%) Fat Layer (Subcutaneous Tissue) Exposed: Yes Necrotic Quality: Adherent Slough Tendon Exposed: No Muscle Exposed: No Joint Exposed: No Bone Exposed: No Treatment Notes Wound #3 (Lower Leg) Wound Laterality: Right Cleanser Soap and Water Discharge Instruction: Gently cleanse wound with antibacterial soap, rinse and  pat dry prior to dressing wounds Peri-Wound Care Topical Primary Dressing Silvercel Small 2x2 (in/in) Discharge Instruction: Apply Silvercel Small 2x2 (in/in) as instructed Secondary Dressing Coverlet Latex-Free Fabric Adhesive Dressings Discharge Instruction: 1.5 x 2 Secured With Compression Wrap Compression Stockings Add-Ons Electronic Signature(s) Signed: 04/13/2023 5:26:09 PM By: [name] Signed: 04/14/2023 5:05:03 PM By: Midge Aver MSN RN CNS WTA Entered By: [name] on 04/13/2023 10:06:51 [name] ([id number]) [id number] Page 9 of 11 -------------------------------------------------------------------------------- Wound Assessment Details Patient Name: Date of Service: [name] 04/13/2023 10:00 A M Medical Record Number: [id number] Patient Account Number: [id number] Date of Birth/Sex: Treating RN: May 27, 1956 (67 y.o. [name] Primary Care Jisela Merlino: [name] Other Clinician: [name] Referring Louann Hopson: Treating Natsuko Kelsay/Extender: Matthew Folks in Treatment: 38 Wound Status Wound Number: 4 Primary Lymphedema Etiology: Wound Location: Right, Posterior Lower Leg Wound Status: Healed - Epithelialized Wounding Event: Gradually Appeared Comorbid Arrhythmia, Congestive Heart Failure, Hypertension, Type II Date Acquired: 04/06/2023 History: Diabetes Weeks Of Treatment: 1 Clustered Wound: No Photos Wound Measurements Length: (cm) 0 Width: (cm) 0 Depth: (cm) 0 Area: (cm) Volume: (cm) % Reduction in Area: 100% % Reduction in Volume: 100% Epithelialization: Large (67-100%) 0 0 Wound Description Classification: Partial Thickness Exudate Amount: None Present Foul Odor After Cleansing: No Slough/Fibrino No Wound Bed Granulation Amount: None Present (0%) Exposed Structure Necrotic Amount: None Present (0%) Fat Layer (Subcutaneous Tissue) Exposed: Yes Treatment  Notes Wound #4 (Lower Leg) Wound Laterality: Right, Posterior Cleanser Peri-Wound Care Topical Primary Dressing Secondary Dressing OSHAE, HUCKLEBY ([id number]) [id number] Page 10 of 11 Secured With Compression Wrap Compression Stockings Add-Ons Electronic Signature(s) Signed: 04/13/2023 5:26:09 PM By: [name] Signed: 04/14/2023 5:05:03 PM By: Midge Aver MSN  RN CNS WTA Entered ByBetha Loa on 04/13/2023 10:51:26 -------------------------------------------------------------------------------- Wound Assessment Details Patient Name: Date of Service: [name] 04/13/2023 10:00 A M Medical Record Number: [id number] Patient Account Number: [id number] Date of Birth/Sex: Treating RN: September 10, 1955 (67 y.o. [name] Primary Care Giulian Goldring: [name] Other Clinician: [name] Referring Kari Kerth: Treating Ariele Vidrio/Extender: Matthew Folks in Treatment: 38 Wound Status Wound Number: 5 Primary Lymphedema Etiology: Wound Location: Left, Posterior Lower Leg Wound Status: Healed - Epithelialized Wounding Event: Gradually Appeared Comorbid Arrhythmia, Congestive Heart Failure, Hypertension, Type II Date Acquired: 04/06/2023 History: Diabetes Weeks Of Treatment: 1 Clustered Wound: No Photos Wound Measurements Length: (cm) Width: (cm) Depth: (cm) Area: (cm) Volume: (cm) 0 % Reduction in Area: 100% 0 % Reduction in Volume: 100% 0 Epithelialization: Large (67-100%) 0 0 Wound Description Classification: Full Thickness Without Exposed Support Structures Exudate Amount: None Present Foul Odor After Cleansing: No Slough/Fibrino No Wound Bed Granulation Amount: None Present (0%) Exposed Structure Necrotic Amount: None Present (0%) Fat Layer (Subcutaneous Tissue) Exposed: Yes BAYLE, YARA ([id number]) [id number] Page 11 of 11 Treatment Notes Wound #5 (Lower Leg) Wound  Laterality: Left, Posterior Cleanser Peri-Wound Care Topical Primary Dressing Secondary Dressing Secured With Compression Wrap Compression Stockings Add-Ons Electronic Signature(s) Signed: 04/13/2023 5:26:09 PM By: [name] Signed: 04/14/2023 5:05:03 PM By: Midge Aver MSN RN CNS WTA Entered By: [name] on 04/13/2023 10:51:50 -------------------------------------------------------------------------------- Vitals Details Patient Name: Date of Service: [name], [name] 04/13/2023 10:00 A M Medical Record Number: [id number] Patient Account Number: [id number] Date of Birth/Sex: Treating RN: 08/02/1955 (67 y.o. [name] Primary Care Evanthia Maund: [name] Other Clinician: [name] Referring Elecia Serafin: Treating Leighton Luster/Extender: Matthew Folks in Treatment: 38 Vital Signs Time Taken: 09:57 Temperature (F): 97.9 Height (in): 73 Pulse (bpm): 76 Weight (lbs): 358 Respiratory Rate (breaths/min): 18 Body Mass Index (BMI): 47.2 Blood Pressure (mmHg): 99/62 Reference Range: 80 - 120 mg / dl Electronic Signature(s) Signed: 04/13/2023 5:26:09 PM By: [name] Entered By: [name] on 04/13/2023 10:00:19

## 2023-04-20 ENCOUNTER — Encounter: Payer: PPO | Attending: Physician Assistant | Admitting: Physician Assistant

## 2023-04-20 DIAGNOSIS — I890 Lymphedema, not elsewhere classified: Secondary | ICD-10-CM | POA: Diagnosis not present

## 2023-04-20 DIAGNOSIS — I5042 Chronic combined systolic (congestive) and diastolic (congestive) heart failure: Secondary | ICD-10-CM | POA: Diagnosis not present

## 2023-04-20 DIAGNOSIS — E11622 Type 2 diabetes mellitus with other skin ulcer: Secondary | ICD-10-CM | POA: Diagnosis not present

## 2023-04-20 DIAGNOSIS — I4891 Unspecified atrial fibrillation: Secondary | ICD-10-CM | POA: Diagnosis not present

## 2023-04-20 DIAGNOSIS — L97812 Non-pressure chronic ulcer of other part of right lower leg with fat layer exposed: Secondary | ICD-10-CM | POA: Diagnosis not present

## 2023-04-20 DIAGNOSIS — L97822 Non-pressure chronic ulcer of other part of left lower leg with fat layer exposed: Secondary | ICD-10-CM | POA: Insufficient documentation

## 2023-04-20 DIAGNOSIS — I110 Hypertensive heart disease with heart failure: Secondary | ICD-10-CM | POA: Insufficient documentation

## 2023-04-20 DIAGNOSIS — E1151 Type 2 diabetes mellitus with diabetic peripheral angiopathy without gangrene: Secondary | ICD-10-CM | POA: Insufficient documentation

## 2023-04-21 DIAGNOSIS — E11622 Type 2 diabetes mellitus with other skin ulcer: Secondary | ICD-10-CM | POA: Diagnosis not present

## 2023-04-21 DIAGNOSIS — E7800 Pure hypercholesterolemia, unspecified: Secondary | ICD-10-CM | POA: Diagnosis not present

## 2023-04-21 DIAGNOSIS — I10 Essential (primary) hypertension: Secondary | ICD-10-CM | POA: Diagnosis not present

## 2023-04-21 DIAGNOSIS — L97929 Non-pressure chronic ulcer of unspecified part of left lower leg with unspecified severity: Secondary | ICD-10-CM | POA: Diagnosis not present

## 2023-04-21 DIAGNOSIS — I428 Other cardiomyopathies: Secondary | ICD-10-CM | POA: Diagnosis not present

## 2023-04-21 DIAGNOSIS — I110 Hypertensive heart disease with heart failure: Secondary | ICD-10-CM | POA: Diagnosis not present

## 2023-04-21 DIAGNOSIS — I509 Heart failure, unspecified: Secondary | ICD-10-CM | POA: Diagnosis not present

## 2023-04-21 DIAGNOSIS — E1169 Type 2 diabetes mellitus with other specified complication: Secondary | ICD-10-CM | POA: Diagnosis not present

## 2023-04-21 DIAGNOSIS — I872 Venous insufficiency (chronic) (peripheral): Secondary | ICD-10-CM | POA: Diagnosis not present

## 2023-04-21 DIAGNOSIS — Z23 Encounter for immunization: Secondary | ICD-10-CM | POA: Diagnosis not present

## 2023-04-21 DIAGNOSIS — Z794 Long term (current) use of insulin: Secondary | ICD-10-CM | POA: Diagnosis not present

## 2023-04-21 NOTE — Progress Notes (Signed)
DONAVYN, FECHER ([id number]) [id number] Page 1 of 10 Visit Report for 04/20/2023 Chief Complaint Document Details Patient Name: Date of Service: Matthew [name] 04/20/2023 12:00 PM Medical Record Number: [id number] Patient Account Number: [id number] Date of Birth/Sex: Treating RN: 19-Jan-1956 (67 y.o. Matthew [name] Primary Care Provider: [name] Other Clinician: Referring Provider: Treating Provider/Extender: Matthew [name] in Treatment: 39 Information Obtained from: Patient Chief Complaint Bilateral LE ulcers and lymphedema Electronic Signature(s) Signed: 04/20/2023 12:43:20 PM By: Matthew Derry PA-C Entered By: Matthew [name] on 04/20/2023 12:43:20 -------------------------------------------------------------------------------- Debridement Details Patient Name: Date of Service: Matthew [name] Matthew J. 04/20/2023 12:00 PM Medical Record Number: [id number] Patient Account Number: [id number] Date of Birth/Sex: Treating RN: [birth year] (67 y.o. Matthew [name] Primary Care Provider: [name] Other Clinician: Referring Provider: Treating Provider/Extender: Matthew [name] in Treatment: 39 Debridement Performed for Assessment: Wound #3 Right Lower Leg Performed By: Physician Matthew Derry, PA-C Debridement Type: Debridement Level of Consciousness (Pre-procedure): Awake and Alert Pre-procedure Verification/Time Out Yes - 12:34 Taken: Pain Control: Lidocaine 4% T opical Solution Percent of Wound Bed Debrided: 100% T Area Debrided (cm): otal 0.19 Tissue and other material debrided: Viable, Non-Viable, Slough, Subcutaneous, Slough Level: Skin/Subcutaneous Tissue Debridement Description: Excisional Instrument: Curette Bleeding: Moderate Hemostasis Achieved: Pressure Response to Treatment: Procedure was tolerated well Level of Consciousness (Post- Awake and Alert procedure): [name], Matthew [name] ([id number])  [id number] Page 2 of 10 Post Debridement Measurements of Total Wound Length: (cm) 0.4 Width: (cm) 0.6 Depth: (cm) 0.1 Volume: (cm) 0.019 Character of Wound/Ulcer Post Debridement: Stable Post Procedure Diagnosis Same as Pre-procedure Electronic Signature(s) Signed: 04/20/2023 12:48:40 PM By: Matthew [name] Signed: 04/20/2023 3:24:56 PM By: Matthew Derry PA-C Entered By: Matthew [name] on 04/20/2023 12:48:40 -------------------------------------------------------------------------------- HPI Details Patient Name: Date of Service: Matthew [name], Matthew Matthew J. 04/20/2023 12:00 PM Medical Record Number: [id number] Patient Account Number: [id number] Date of Birth/Sex: Treating RN: 01-27-1956 (67 y.o. Matthew [name] Primary Care Provider: [name] Other Clinician: Referring Provider: Treating Provider/Extender: Matthew [name] in Treatment: 39 History of Present Illness HPI Description: 07-16-2022 upon evaluation today patient appears to be doing poorly currently in regard to his his legs. The left leg is actually worse than the right. This is a patient that is previously been seen in the [place] office and at that point was doing really quite well with compression wrapping and often alginate are either Hydrofera Blue dressings. Fortunately he has gone since November 2020 until just current with out having any issue or need for wound care services which is great news. Unfortunately he is here today because he is having some issues at this point. Patient does have a history of several medical conditions which include diabetes mellitus type 2, lymphedema, hypertension, congestive heart failure, and cardiac arrhythmia though is not sure that this is actually atrial fibrillation based on what he has been told. He does have lymphedema pumps but tells me he has not used them since the summer when he had to move to a remodel of his building and  subsequently never unpacked them. 07-23-2022 upon evaluation today patient appears to be doing well with regard to his legs. He is going require some sharp debridement today but overall seems to be making some progress. I am pleased in that regard he has much less drainage that he had did last week I think the compression wraps are definitely helping. 07-30-2022 upon evaluation today patient appears to be doing poorly  in regard to his legs he actually has blue-green drainage which I think is consistent with Pseudomonas. I believe that we need to address this as soon as possible and I discussed that with the patient today as well. Fortunately I do not see any signs of active infection locally nor systemically at this time which is great news. No fevers, chills, nausea, vomiting, or diarrhea. 08-13-2022 upon evaluation today patient appears to be doing well currently in regard to his leg ulcers which are actually looking much better. Fortunately there does not appear to be any signs of active infection at this time which is great news. No fevers, chills, nausea, vomiting, or diarrhea. 3/7; patient with predominantly a left medial lower extremity wound as well as several smaller areas and a small area on the right lateral. His Matthew [name] came in today. We are using silver alginate as the primary dressing 09-03-2022 upon evaluation today patient appears to be doing well currently in regard to his wounds. He has been tolerating the dressing changes without complication. Fortunately there does not appear to be any signs of infection there is needed however for sharp debridement. 09-10-2022 upon evaluation today patient appears to be doing well currently in regard to his wound. Has been tolerating the dressing changes without complication. Fortunately there does not appear to be any signs of infection looking systemically which is great news and overall I am extremely pleased with where we stand today. I do not see  any signs of infection. 09-17-2022 upon evaluation today patient actually is making signs of improvement the good news is he is making good improvement. With that being said he still is having quite a time keeping the swelling down. We have been using Tubigrip I think that still probably our best option but nonetheless I think that we are making progress with regard to his wounds. 09-24-2022 upon evaluation today patient appears to be doing well currently in regard to his wounds is continuing to make improvements at this point which is great news and overall I am extremely pleased with where we stand today. Fortunately I do not see any evidence of active infection at this time which is great news and in general I think he is doing quite well with the [facility] and silver alginate. 10-01-2022 upon evaluation today patient appears to be doing pretty well currently in regard to his wounds. He has been tolerating the dressing changes without complication. Fortunately I do not see any evidence of active infection locally nor systemically which is great news and I do believe that Matthew [name], Matthew [name] ([id number]) [id number] Page 3 of 10 10-08-2022 upon evaluation today patient's wounds actually are showing some signs of improvement which is great news. Still this is very slow to heal I really feel like he would do better if we can get him in some stronger compression wrapping he does have the juxta lite compression wraps therefore we can actually see about doing that over top of his Tubigrip to see if that can be beneficial. He does not love the hide he had as they are not the most comfortable thing for him but nonetheless I think it would be beneficial he is in agreement with going forward with this. 10-15-2022 upon evaluation today patient appears to be doing well currently in regard to his wounds that do not appear to be infected which is good news. With that being said unfortunately he  still continues to have significant amount of swelling we really need to do  something to try to get the swelling under control. I do not see any signs of infection locally nor systemically but at the same time the amount of edema is tremendous. The juxta lite helps but again he is only taking the fluid pills once or twice a week due to the amount that makes him go to the restroom. Therefore he does not take this daily which is really what he needs on a more regular basis. We need to try to get some compression on and is a little bit stronger. 10-22-2022 upon evaluation today patient actually appears to be making some really good progress in regard to his wound. The compression wrap was put on last time actually doing a great job he looks much improved and in general I feel like that we are making great progress. I do not see any evidence of active infection locally nor systemically which is great news. 10-29-2022 upon evaluation today patient appears to be doing decently well in regard to his legs currently. I feel like that his swelling is much better I feel like that he is also doing much better in regard to the wounds in general. I do not see any signs of active infection which is great news and in general I think that he is making excellent progress towards complete resolution. The patient does seem to have 1 issue that is with his wraps actually slipping down working I definitely need to work on that aspect. 11-05-2022 upon evaluation today patient appears to be doing excellent in regard to his wound. He has been tolerating the dressing changes without complication. He actually seems to be making excellent progress I am extremely pleased with where things stand currently. I do believe that we are really making good progress and I think that we are on the right track towards complete closure I think the compression wrap has been a dramatic shift in a good way for him as far as getting the wounds healed  a lot of these areas that we will continue to leave Completely sealed up. 11-12-2022 upon evaluation today patient appears to be doing well currently in regard to his leg ulcer. The compression wrapping seems to be doing an excellent job. Fortunately I do not see any evidence of active infection locally nor systemically which is great news and in general I do believe that we are making good headway here towards complete closure. 11-19-2022 upon evaluation today patient appears to be doing well currently in regard to his wounds. He is actually showing signs of excellent improvement and very pleased with where we stand I think that he is making great progress. I do not see any evidence of infection at this time which is great news. 11-26-2022 upon evaluation today patient appears to be doing well currently in regard to his wound. He is actually been tolerating the dressing changes without complication. Fortunately I do not see any evidence of active infection locally nor systemically which is great news I think he is making excellent progress here towards closure. 12-03-2022 upon evaluation today patient appears to be doing well currently in regard to his wound. He in fact at both locations is doing great and seems to be making excellent progress and very pleased in that regard. I do not see any signs of active infection at this time. 12-10-2022 upon evaluation patient's wound is actually significantly smaller with one of the satellite lesions closed and there is just 1 small area remaining and this is actually doing  quite well. I am very pleased with her progress. 12-15-2022 upon evaluation today patient appears to be doing actually pretty well in regard to his wound on the left leg. He does have a little area on the backside that open that was previously close last week due to the fact that his wrap got wet over the weekend and he had to make do with stuff they can find at the drugstore. They made this work  out however and the good news is he actually seems to be doing significantly better and the wound looks fine except for the fact that he has a couple of areas that just reopen on the backside very tiny. Nonetheless I do believe that we can go ahead and see what we do about trying to get this moving in the right direction yet again. 12-22-2022 upon evaluation today patient appears to be doing poorly in regard to his wrap slid down and his leg is extremely swollen. It is also painful secondary to it being so swollen. Fortunately I do not see any evidence of infection right now but at the same time I do believe that if we do not get the swelling under control is not really good to alleviate his discomfort. We definitely can need to change his wrap out however in short order. 12-31-2022 upon evaluation today patient appears to be doing well currently in regard to his wound. He has been tolerating the dressing changes without complication. This actually looks to be doing much better today compared to where we were. Fortunately I do not see any evidence of active infection locally or systemically which is great news. 01-11-2023 upon evaluation today patient appears to be doing well currently in regard to his wound which is actually showing signs of significant improvement. Fortunately I do not see any signs of infection locally or systemically which is great news and in general I do believe that we are making good headway towards complete closure. 01-19-2023 upon evaluation today patient appears to be doing well currently hemoglobin to his wound. He has been tolerating the dressing changes without complication. Fortunately the wound is not infected unfortunately it is a little bigger due to the fact that the wrap slid down. I do not see any signs of worsening or infection at this time. 01-28-2023 upon evaluation today patient appears to be doing well currently in regard to his wound. He has been tolerating the  dressing changes without complication. Fortunately I do not see any signs of infection the compression wrap is doing a really good job and I think it worked pretty much just about healed. 02-04-2023 upon evaluation today patient appears to be doing excellent in regard to his wounds. In fact he is pretty much completely healed on the left although I think this may have just a pinpoint opening. Nonetheless I think it is 1 more week to toughen up. 02-16-2023 upon evaluation today patient appears to be doing well currently in regard to his wound. He has been tolerating the dressing changes without complication. Fortunately there does not appear to be any evidence of active infection locally or systemically which is good news. In fact the original wounds we been taking care of on the left and right legs are completely healed unfortunately his wrap slipped down the right leg and there are 2 small areas on the anterior portion of his leg which are open at this point. When I wrapped his right leg the left leg is completely closed however he  is in agreement to his own compression sock. 02-26-2023 upon evaluation today patient appears to be doing well currently in regard to his left leg which is still close in the right leg is doing significantly better. Fortunately there does not appear to be any signs of active infection locally or systemically which is great news and in general I do believe that we will making headway towards complete closure. 03-05-2023 upon evaluation today patient actually appears to be doing excellent in regard to his wounds one of the areas healed the other is significantly improved. I am actually very pleased with where we stand today and I think that he is doing well with his compression stockings which is excellent as well. 03-19-2023 upon evaluation today patient appears to be doing poorly currently in regard to his wounds. He seems to be doing a bit worse as far as both legs  are concerned as far as open wounds and I think that we are going need to address this rapidly to keep it from backtracking and getting significantly worse. He voiced understanding he kind of somewhat expected I believe. 03-29-2023 upon evaluation patient actually showing signs of definite improvement with regard to his wounds currently with some already drying up and on the right lateral leg this is much smaller. Will continue to wrap him this seems to be doing quite well. Matthew [name], Matthew [name] ([id number]) [id number] Page 4 of 10 04-06-2023 upon evaluation today patient's wounds on the legs actually appear to be doing better which is great news. Fortunately I do not see any evidence of worsening overall and I do believe that the patient is making good headway towards closure which is good news. I do not see any signs of infection which is also excellent. He actually only had the wraps on for a couple of days before they started slipping down he had cut them off and therefore spent the remaining 5 days pretty much just in his compression socks he seems of done very well though. 04-13-2023 upon evaluation patient is down to just 1 wound on the right leg everything appears to be doing excellent I think he is making excellent progress towards complete closure. Fortunately I do not see any signs of active infection locally or systemically which is great news. 04-20-2023 upon evaluation today patient appears to be doing well currently in regard to his wound. He has been tolerating the dressing changes without complication. Fortunately there does not appear to be any signs of active infection at this time. No fevers, chills, nausea, vomiting, or diarrhea. Electronic Signature(s) Signed: 04/20/2023 12:43:29 PM By: Matthew Derry PA-C Entered By: Matthew [name] on 04/20/2023 12:43:28 -------------------------------------------------------------------------------- Physical Exam  Details Patient Name: Date of Service: Matthew [name], Matthew Matthew J. 04/20/2023 12:00 PM Medical Record Number: [id number] Patient Account Number: [id number] Date of Birth/Sex: Treating RN: 1956/01/07 (67 y.o. Matthew [name] Primary Care Provider: [name] Other Clinician: Referring Provider: Treating Provider/Extender: Matthew [name] in Treatment: 62 Constitutional Well-nourished and well-hydrated in no acute distress. Respiratory normal breathing without difficulty. Psychiatric this patient is able to make decisions and demonstrates good insight into disease process. Alert and Oriented x 3. pleasant and cooperative. Notes Upon inspection patient's wound bed actually showed signs of good granulation epithelization at this point. Fortunately I do not see any signs of worsening overall and I do believe that the patient is making good headway here towards closure. No fevers, chills, nausea, vomiting, or diarrhea. I did perform some debridement clearway  some of the necrotic debris he tolerated this today without complication and postdebridement the wound bed appears to be doing much better which is great news. Electronic Signature(s) Signed: 04/20/2023 12:43:54 PM By: Matthew Derry PA-C Entered By: Matthew [name] on 04/20/2023 12:43:54 -------------------------------------------------------------------------------- Physician Orders Details Patient Name: Date of Service: Matthew [name], Matthew Matthew J. 04/20/2023 12:00 PM Medical Record Number: [id number] Patient Account Number: [id number] Date of Birth/Sex: Treating RN: Nov 05, 1955 (67 y.o. Matthew [name] Primary Care Provider: [name] Other Clinician: VINNIE, GOMBERT ([id number]) [id number] Page 5 of 10 Referring Provider: Treating Provider/Extender: Matthew [name] in Treatment: 39 The following information was scribed by: Matthew [name] The information was scribed for: Matthew [name] Verbal / Phone Orders: No Diagnosis Coding Follow-up Appointments Return Appointment in 1 week. Nurse Visit as needed Bathing/ Shower/ Hygiene May shower with wound dressing protected with water repellent cover or cast protector. Anesthetic (Use 'Patient Medications' Section for Anesthetic Order Entry) Lidocaine applied to wound bed Edema Control - Orders / Instructions Patient to wear own compression stockings. Remove compression stockings every night before going to bed and put on every morning when getting up. Elevate, Exercise Daily and A void Standing for Long Periods of Time. Elevate leg(s) parallel to the floor when sitting. DO YOUR BEST to sleep in the bed at night. DO NOT sleep in your recliner. Long hours of sitting in a recliner leads to swelling of the legs and/or potential wounds on your backside. Non-Wound Condition Right Lower Extremity dditional non-wound orders/instructions: - A and D and abd to right heel, small piece of silvercell to area on LLL A Wound Treatment Wound #3 - Lower Leg Wound Laterality: Right Cleanser: Soap and Water 1 x Per Day/30 Days Discharge Instructions: Gently cleanse wound with antibacterial soap, rinse and pat dry prior to dressing wounds Prim Dressing: Silvercel Small 2x2 (in/in) 1 x Per Day/30 Days ary Discharge Instructions: Apply Silvercel Small 2x2 (in/in) as instructed Secondary Dressing: Coverlet Latex-Free Fabric Adhesive Dressings 1 x Per Day/30 Days Discharge Instructions: 1.5 x 2 Electronic Signature(s) Signed: 04/20/2023 3:24:56 PM By: Matthew Derry PA-C Signed: 04/20/2023 4:26:12 PM By: Matthew [name] Entered By: Matthew [name] on 04/20/2023 12:49:00 -------------------------------------------------------------------------------- Problem List Details Patient Name: Date of Service: Matthew [name] Matthew J. 04/20/2023 12:00 PM Medical Record Number: [id number] Patient Account Number: [id number] Date of Birth/Sex: Treating  RN: 1956-06-15 (67 y.o. Matthew [name] Primary Care Provider: [name] Other Clinician: Referring Provider: Treating Provider/Extender: Matthew [name] in Treatment: 39 Active Problems ICD-10 Encounter Code Description Active Date MDM Diagnosis URIAN, MARTENSON ([id number]) [id number] Page 6 of 10 E11.622 Type 2 diabetes mellitus with other skin ulcer 07/16/2022 No Yes I89.0 Lymphedema, not elsewhere classified 07/16/2022 No Yes L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2/06/2022 No Yes L97.812 Non-pressure chronic ulcer of other part of right lower leg with fat layer 07/16/2022 No Yes exposed I10 Essential (primary) hypertension 07/16/2022 No Yes I50.42 Chronic combined systolic (congestive) and diastolic (congestive) heart failure 07/16/2022 No Yes I49.9 Cardiac arrhythmia, unspecified 07/16/2022 No Yes Inactive Problems Resolved Problems Electronic Signature(s) Signed: 04/20/2023 12:43:16 PM By: Matthew Derry PA-C Entered By: Matthew [name] on 04/20/2023 12:43:16 -------------------------------------------------------------------------------- Progress Note Details Patient Name: Date of Service: Matthew [name], Matthew Matthew J. 04/20/2023 12:00 PM Medical Record Number: [id number] Patient Account Number: [id number] Date of Birth/Sex: Treating RN: February 06, 1956 (67 y.o. Matthew [name] Primary Care Provider: [name] Other Clinician: Referring  Provider: Treating Provider/Extender: Matthew [name] in Treatment: 39 Subjective Chief Complaint Information obtained from Patient Bilateral LE ulcers and lymphedema History of Present Illness (HPI) 07-16-2022 upon evaluation today patient appears to be doing poorly currently in regard to his his legs. The left leg is actually worse than the right. This is a patient that is previously been seen in the [place] office and at that point was doing  really quite well with compression wrapping and often alginate are either Hydrofera Blue dressings. Fortunately he has gone since November 2020 until just current with out having any issue or need for wound care services which is great news. Unfortunately he is here today because he is having some issues at this point. Patient does have a history of several medical conditions which include diabetes mellitus type 2, lymphedema, hypertension, congestive heart failure, and cardiac arrhythmia though is not sure that this is actually atrial fibrillation based on what he has been told. He does have lymphedema pumps but tells me he has not used them since the summer when he had to move to a remodel of his building and subsequently never unpacked them. Matthew [name], Matthew [name] ([id number]) [id number] Page 7 of 10 07-23-2022 upon evaluation today patient appears to be doing well with regard to his legs. He is going require some sharp debridement today but overall seems to be making some progress. I am pleased in that regard he has much less drainage that he had did last week I think the compression wraps are definitely helping. 07-30-2022 upon evaluation today patient appears to be doing poorly in regard to his legs he actually has blue-green drainage which I think is consistent with Pseudomonas. I believe that we need to address this as soon as possible and I discussed that with the patient today as well. Fortunately I do not see any signs of active infection locally nor systemically at this time which is great news. No fevers, chills, nausea, vomiting, or diarrhea. 08-13-2022 upon evaluation today patient appears to be doing well currently in regard to his leg ulcers which are actually looking much better. Fortunately there does not appear to be any signs of active infection at this time which is great news. No fevers, chills, nausea, vomiting, or diarrhea. 3/7; patient with predominantly a  left medial lower extremity wound as well as several smaller areas and a small area on the right lateral. His Matthew [name] came in today. We are using silver alginate as the primary dressing 09-03-2022 upon evaluation today patient appears to be doing well currently in regard to his wounds. He has been tolerating the dressing changes without complication. Fortunately there does not appear to be any signs of infection there is needed however for sharp debridement. 09-10-2022 upon evaluation today patient appears to be doing well currently in regard to his wound. Has been tolerating the dressing changes without complication. Fortunately there does not appear to be any signs of infection looking systemically which is great news and overall I am extremely pleased with where we stand today. I do not see any signs of infection. 09-17-2022 upon evaluation today patient actually is making signs of improvement the good news is he is making good improvement. With that being said he still is having quite a time keeping the swelling down. We have been using Tubigrip I think that still probably our best option but nonetheless I think that we are making progress with regard to his wounds. 09-24-2022 upon evaluation today patient appears to  be doing well currently in regard to his wounds is continuing to make improvements at this point which is great news and overall I am extremely pleased with where we stand today. Fortunately I do not see any evidence of active infection at this time which is great news and in general I think he is doing quite well with the [facility] and silver alginate. 10-01-2022 upon evaluation today patient appears to be doing pretty well currently in regard to his wounds. He has been tolerating the dressing changes without complication. Fortunately I do not see any evidence of active infection locally nor systemically which is great news and I do believe that 10-08-2022 upon evaluation today patient's  wounds actually are showing some signs of improvement which is great news. Still this is very slow to heal I really feel like he would do better if we can get him in some stronger compression wrapping he does have the juxta lite compression wraps therefore we can actually see about doing that over top of his Tubigrip to see if that can be beneficial. He does not love the hide he had as they are not the most comfortable thing for him but nonetheless I think it would be beneficial he is in agreement with going forward with this. 10-15-2022 upon evaluation today patient appears to be doing well currently in regard to his wounds that do not appear to be infected which is good news. With that being said unfortunately he still continues to have significant amount of swelling we really need to do something to try to get the swelling under control. I do not see any signs of infection locally nor systemically but at the same time the amount of edema is tremendous. The juxta lite helps but again he is only taking the fluid pills once or twice a week due to the amount that makes him go to the restroom. Therefore he does not take this daily which is really what he needs on a more regular basis. We need to try to get some compression on and is a little bit stronger. 10-22-2022 upon evaluation today patient actually appears to be making some really good progress in regard to his wound. The compression wrap was put on last time actually doing a great job he looks much improved and in general I feel like that we are making great progress. I do not see any evidence of active infection locally nor systemically which is great news. 10-29-2022 upon evaluation today patient appears to be doing decently well in regard to his legs currently. I feel like that his swelling is much better I feel like that he is also doing much better in regard to the wounds in general. I do not see any signs of active infection which is great news  and in general I think that he is making excellent progress towards complete resolution. The patient does seem to have 1 issue that is with his wraps actually slipping down working I definitely need to work on that aspect. 11-05-2022 upon evaluation today patient appears to be doing excellent in regard to his wound. He has been tolerating the dressing changes without complication. He actually seems to be making excellent progress I am extremely pleased with where things stand currently. I do believe that we are really making good progress and I think that we are on the right track towards complete closure I think the compression wrap has been a dramatic shift in a good way for him as far  as getting the wounds healed a lot of these areas that we will continue to leave Completely sealed up. 11-12-2022 upon evaluation today patient appears to be doing well currently in regard to his leg ulcer. The compression wrapping seems to be doing an excellent job. Fortunately I do not see any evidence of active infection locally nor systemically which is great news and in general I do believe that we are making good headway here towards complete closure. 11-19-2022 upon evaluation today patient appears to be doing well currently in regard to his wounds. He is actually showing signs of excellent improvement and very pleased with where we stand I think that he is making great progress. I do not see any evidence of infection at this time which is great news. 11-26-2022 upon evaluation today patient appears to be doing well currently in regard to his wound. He is actually been tolerating the dressing changes without complication. Fortunately I do not see any evidence of active infection locally nor systemically which is great news I think he is making excellent progress here towards closure. 12-03-2022 upon evaluation today patient appears to be doing well currently in regard to his wound. He in fact at both locations is  doing great and seems to be making excellent progress and very pleased in that regard. I do not see any signs of active infection at this time. 12-10-2022 upon evaluation patient's wound is actually significantly smaller with one of the satellite lesions closed and there is just 1 small area remaining and this is actually doing quite well. I am very pleased with her progress. 12-15-2022 upon evaluation today patient appears to be doing actually pretty well in regard to his wound on the left leg. He does have a little area on the backside that open that was previously close last week due to the fact that his wrap got wet over the weekend and he had to make do with stuff they can find at the drugstore. They made this work out however and the good news is he actually seems to be doing significantly better and the wound looks fine except for the fact that he has a couple of areas that just reopen on the backside very tiny. Nonetheless I do believe that we can go ahead and see what we do about trying to get this moving in the right direction yet again. 12-22-2022 upon evaluation today patient appears to be doing poorly in regard to his wrap slid down and his leg is extremely swollen. It is also painful secondary to it being so swollen. Fortunately I do not see any evidence of infection right now but at the same time I do believe that if we do not get the swelling under control is not really good to alleviate his discomfort. We definitely can need to change his wrap out however in short order. 12-31-2022 upon evaluation today patient appears to be doing well currently in regard to his wound. He has been tolerating the dressing changes without complication. This actually looks to be doing much better today compared to where we were. Fortunately I do not see any evidence of active infection locally or systemically which is great news. 01-11-2023 upon evaluation today patient appears to be doing well currently in  regard to his wound which is actually showing signs of significant improvement. Fortunately I do not see any signs of infection locally or systemically which is great news and in general I do believe that we are making good headway  towards complete closure. Matthew [name], Matthew [name] ([id number]) [id number] Page 8 of 10 01-19-2023 upon evaluation today patient appears to be doing well currently hemoglobin to his wound. He has been tolerating the dressing changes without complication. Fortunately the wound is not infected unfortunately it is a little bigger due to the fact that the wrap slid down. I do not see any signs of worsening or infection at this time. 01-28-2023 upon evaluation today patient appears to be doing well currently in regard to his wound. He has been tolerating the dressing changes without complication. Fortunately I do not see any signs of infection the compression wrap is doing a really good job and I think it worked pretty much just about healed. 02-04-2023 upon evaluation today patient appears to be doing excellent in regard to his wounds. In fact he is pretty much completely healed on the left although I think this may have just a pinpoint opening. Nonetheless I think it is 1 more week to toughen up. 02-16-2023 upon evaluation today patient appears to be doing well currently in regard to his wound. He has been tolerating the dressing changes without complication. Fortunately there does not appear to be any evidence of active infection locally or systemically which is good news. In fact the original wounds we been taking care of on the left and right legs are completely healed unfortunately his wrap slipped down the right leg and there are 2 small areas on the anterior portion of his leg which are open at this point. When I wrapped his right leg the left leg is completely closed however he is in agreement to his own compression sock. 02-26-2023 upon evaluation today  patient appears to be doing well currently in regard to his left leg which is still close in the right leg is doing significantly better. Fortunately there does not appear to be any signs of active infection locally or systemically which is great news and in general I do believe that we will making headway towards complete closure. 03-05-2023 upon evaluation today patient actually appears to be doing excellent in regard to his wounds one of the areas healed the other is significantly improved. I am actually very pleased with where we stand today and I think that he is doing well with his compression stockings which is excellent as well. 03-19-2023 upon evaluation today patient appears to be doing poorly currently in regard to his wounds. He seems to be doing a bit worse as far as both legs are concerned as far as open wounds and I think that we are going need to address this rapidly to keep it from backtracking and getting significantly worse. He voiced understanding he kind of somewhat expected I believe. 03-29-2023 upon evaluation patient actually showing signs of definite improvement with regard to his wounds currently with some already drying up and on the right lateral leg this is much smaller. Will continue to wrap him this seems to be doing quite well. 04-06-2023 upon evaluation today patient's wounds on the legs actually appear to be doing better which is great news. Fortunately I do not see any evidence of worsening overall and I do believe that the patient is making good headway towards closure which is good news. I do not see any signs of infection which is also excellent. He actually only had the wraps on for a couple of days before they started slipping down he had cut them off and therefore spent the remaining 5 days pretty much just in his  compression socks he seems of done very well though. 04-13-2023 upon evaluation patient is down to just 1 wound on the right leg everything appears to  be doing excellent I think he is making excellent progress towards complete closure. Fortunately I do not see any signs of active infection locally or systemically which is great news. 04-20-2023 upon evaluation today patient appears to be doing well currently in regard to his wound. He has been tolerating the dressing changes without complication. Fortunately there does not appear to be any signs of active infection at this time. No fevers, chills, nausea, vomiting, or diarrhea. Objective Constitutional Well-nourished and well-hydrated in no acute distress. Vitals Time Taken: 12:10 PM, Height: 73 in, Weight: 358 lbs, BMI: 47.2, Temperature: 97.8 F, Pulse: 65 bpm, Respiratory Rate: 18 breaths/min, Blood Pressure: 114/66 mmHg. Respiratory normal breathing without difficulty. Psychiatric this patient is able to make decisions and demonstrates good insight into disease process. Alert and Oriented x 3. pleasant and cooperative. General Notes: Upon inspection patient's wound bed actually showed signs of good granulation epithelization at this point. Fortunately I do not see any signs of worsening overall and I do believe that the patient is making good headway here towards closure. No fevers, chills, nausea, vomiting, or diarrhea. I did perform some debridement clearway some of the necrotic debris he tolerated this today without complication and postdebridement the wound bed appears to be doing much better which is great news. Integumentary (Hair, Skin) Wound #3 status is Open. Original cause of wound was Gradually Appeared. The date acquired was: 02/04/2023. The wound has been in treatment 10 weeks. The wound is located on the Right Lower Leg. The wound measures 0.4cm length x 0.6cm width x 0.1cm depth; 0.188cm^2 area and 0.019cm^3 volume. There is Fat Layer (Subcutaneous Tissue) exposed. There is no tunneling or undermining noted. There is a medium amount of serosanguineous drainage noted. There is  medium (34-66%) red granulation within the wound bed. There is a medium (34-66%) amount of necrotic tissue within the wound bed including Adherent Slough. Other Condition(s) Patient presents with Lymphedema located on the Left Leg. General Notes: small area to back of left leg noted to be open, measuring 0.3x0.5x0.1, minimal drainage Lymphedema Assessment Symptoms: (check all that apply): Edema - Progressive Assessment Matthew [name], Matthew [name] ([id number]) [id number] Page 9 of 10 Active Problems ICD-10 Type 2 diabetes mellitus with other skin ulcer Lymphedema, not elsewhere classified Non-pressure chronic ulcer of other part of left lower leg with fat layer exposed Non-pressure chronic ulcer of other part of right lower leg with fat layer exposed Essential (primary) hypertension Chronic combined systolic (congestive) and diastolic (congestive) heart failure Cardiac arrhythmia, unspecified Plan Follow-up Appointments: Return Appointment in 1 week. Nurse Visit as needed Bathing/ Shower/ Hygiene: May shower with wound dressing protected with water repellent cover or cast protector. Anesthetic (Use 'Patient Medications' Section for Anesthetic Order Entry): Lidocaine applied to wound bed Edema Control - Orders / Instructions: Patient to wear own compression stockings. Remove compression stockings every night before going to bed and put on every morning when getting up. Elevate, Exercise Daily and Avoid Standing for Long Periods of Time. Elevate leg(s) parallel to the floor when sitting. DO YOUR BEST to sleep in the bed at night. DO NOT sleep in your recliner. Long hours of sitting in a recliner leads to swelling of the legs and/or potential wounds on your backside. Non-Wound Condition: Additional non-wound orders/instructions: - A and D and abd to right heel, small piece of silvercell  to area on LLL WOUND #3: - Lower Leg Wound Laterality: Right Cleanser: Soap and  Water 1 x Per Day/30 Days Discharge Instructions: Gently cleanse wound with antibacterial soap, rinse and pat dry prior to dressing wounds Prim Dressing: Silvercel Small 2x2 (in/in) 1 x Per Day/30 Days ary Discharge Instructions: Apply Silvercel Small 2x2 (in/in) as instructed Secondary Dressing: Coverlet Latex-Free Fabric Adhesive Dressings 1 x Per Day/30 Days Discharge Instructions: 1.5 x 2 1. Based on what I am seeing I do believe that the patient is making good headway here towards closure and I am very pleased in that regard. 2. I am would recommend as well that he should continue with the silver alginate dressing which I think is still doing a really good job here. 3. I am going to recommend the patient should continue to utilize a Band-Aid to cover and his compression socks I think this is still doing well for him. We will see patient back for reevaluation in 1 week here in the clinic. If anything worsens or changes patient will contact our office for additional recommendations. Electronic Signature(s) Signed: 04/20/2023 12:44:25 PM By: Matthew Derry PA-C Entered By: Matthew [name] on 04/20/2023 12:44:25 -------------------------------------------------------------------------------- SuperBill Details Patient Name: Date of Service: Matthew [name], Matthew Matthew J. 04/20/2023 Medical Record Number: [id number] Patient Account Number: [id number] Date of Birth/Sex: Treating RN: 11/07/1955 (67 y.o. Matthew [name] Primary Care Provider: [name] Other Clinician: Referring Provider: Treating Provider/Extender: Matthew [name] in Treatment: [address] 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 I10 Essential (primary) hypertension I50.42 Chronic combined systolic (congestive) and diastolic (congestive) heart failure I49.9 Cardiac arrhythmia, unspecified Facility Procedures : CPT4 Code: [id number] Description: 11042 - DEB SUBQ TISSUE 20 SQ CM/< ICD-10 Diagnosis Description L97.812 Non-pressure chronic ulcer of other part of right lower leg with fat layer exp Modifier: osed Quantity: 1 Physician Procedures : CPT4 Code Description Modifier [id number] - WC PHYS LEVEL 3 - EST PT ICD-10 Diagnosis Description E11.622 Type 2 diabetes mellitus with other skin ulcer I89.0 Lymphedema, not elsewhere classified L97.822 Non-pressure chronic ulcer of other part of  left lower leg with fat layer exposed L97.812 Non-pressure chronic ulcer of other part of right lower leg with fat layer exposed Quantity: 1 : [id number] 11042 - WC PHYS SUBQ TISS 20 SQ CM ICD-10 Diagnosis Description L97.812 Non-pressure chronic ulcer of other part of right lower leg with fat layer exposed Quantity: 1 Electronic Signature(s) Signed: 04/20/2023 12:50:48 PM By: Matthew [name] Signed: 04/20/2023 3:24:56 PM By: Matthew Derry PA-C Previous Signature: 04/20/2023 12:50:16 PM Version By: Matthew [name] Previous Signature: 04/20/2023 12:44:38 PM Version By: Matthew Derry PA-C Entered By: Matthew [name] on 04/20/2023 12:50:47

## 2023-04-21 NOTE — Progress Notes (Signed)
Matthew, [name] ([id number]) [id number] Page 1 of 9 Visit Report for 04/20/2023 Arrival Information Details Patient Name: Date of Service: [name] 04/20/2023 12:00 PM Medical Record Number: [id number] Patient Account Number: [id number] Date of Birth/Sex: Treating RN: 02-Dec-1955 (67 y.o. [name] Primary Care Tabrina Esty: [name] Other Clinician: Referring Dakota Vanwart: Treating Bucky Grigg/Extender: Matthew Folks in Treatment: 39 Visit Information History Since Last Visit Added or deleted any medications: No Patient Arrived: [name] Any new allergies or adverse reactions: No Arrival Time: 12:20 Had a fall or experienced change in No Accompanied By: spouse activities of daily living that may affect Transfer Assistance: None risk of falls: Patient Identification Verified: Yes Hospitalized since last visit: No Secondary Verification Process Completed: Yes Has Dressing in Place as Prescribed: Yes Patient Requires Transmission-Based Precautions: No Has Compression in Place as Prescribed: Yes Patient Has Alerts: No Pain Present Now: Yes Electronic Signature(s) Signed: 04/20/2023 12:21:03 PM By: Angelina Pih Entered By: Angelina Pih on 04/20/2023 12:21:03 -------------------------------------------------------------------------------- Clinic Level of Care Assessment Details Patient Name: Date of Service: [name] 04/20/2023 12:00 PM Medical Record Number: [id number] Patient Account Number: [id number] Date of Birth/Sex: Treating RN: 09/25/1955 (67 y.o. [name] Primary Care Saivion Goettel: [name] Other Clinician: Referring Montell Leopard: Treating Shatoria Stooksbury/Extender: Matthew Folks in Treatment: 39 Clinic Level of Care Assessment Items TOOL 1 Quantity Score []  - 0 Use when EandM and Procedure is performed on INITIAL visit ASSESSMENTS - Nursing Assessment / Reassessment []  -  0 General Physical Exam (combine w/ comprehensive assessment (listed just below) when performed on new pt. evals) []  - 0 Comprehensive Assessment (HX, ROS, Risk Assessments, Wounds Hx, etc.) ASSESSMENTS - Wound and Skin Assessment / Reassessment []  - 0 Dermatologic / Skin Assessment (not related to wound area) Matthew, [name] ([id number]) [id number] Page 2 of 9 ASSESSMENTS - Ostomy and/or Continence Assessment and Care []  - 0 Incontinence Assessment and Management []  - 0 Ostomy Care Assessment and Management (repouching, etc.) PROCESS - Coordination of Care []  - 0 Simple Patient / Family Education for ongoing care []  - 0 Complex (extensive) Patient / Family Education for ongoing care []  - 0 Staff obtains Chiropractor, Records, T Results / Process Orders est []  - 0 Staff telephones HHA, Nursing Homes / Clarify orders / etc []  - 0 Routine Transfer to another Facility (non-emergent condition) []  - 0 Routine Hospital Admission (non-emergent condition) []  - 0 New Admissions / Manufacturing engineer / Ordering NPWT Apligraf, etc. , []  - 0 Emergency Hospital Admission (emergent condition) PROCESS - Special Needs []  - 0 Pediatric / Minor Patient Management []  - 0 Isolation Patient Management []  - 0 Hearing / Language / Visual special needs []  - 0 Assessment of Community assistance (transportation, D/C planning, etc.) []  - 0 Additional assistance / Altered mentation []  - 0 Support Surface(s) Assessment (bed, cushion, seat, etc.) INTERVENTIONS - Miscellaneous []  - 0 External ear exam []  - 0 Patient Transfer (multiple staff / Nurse, adult / Similar devices) []  - 0 Simple Staple / Suture removal (25 or less) []  - 0 Complex Staple / Suture removal (26 or more) []  - 0 Hypo/Hyperglycemic Management (do not check if billed separately) []  - 0 Ankle / Brachial Index (ABI) - do not check if billed separately Has the patient been seen at the hospital  within the last three years: Yes Total Score: 0 Level Of Care: ____ Electronic Signature(s) Signed: 04/20/2023 4:26:12 PM By: Angelina Pih Entered By:  Angelina Pih on 04/20/2023 12:50:38 -------------------------------------------------------------------------------- Encounter Discharge Information Details Patient Name: Date of Service: [name] 04/20/2023 12:00 PM Medical Record Number: [id number] Patient Account Number: [id number] Date of Birth/Sex: Treating RN: 01-28-1956 (67 y.o. [name] Primary Care Natan Hartog: [name] Other Clinician: Referring Celise Bazar: Treating Julya Alioto/Extender: Matthew Folks in Treatment: 39 Encounter Discharge Information Items Post Procedure Vitals Matthew, [name] ([id number]) [id number] Page 3 of 9 Discharge Condition: Stable Temperature (F): 97.8 Ambulatory Status: Cane Pulse (bpm): 65 Discharge Destination: Home Respiratory Rate (breaths/min): 18 Transportation: Private Auto Blood Pressure (mmHg): 114/66 Accompanied By: self Schedule Follow-up Appointment: Yes Clinical Summary of Care: Electronic Signature(s) Signed: 04/20/2023 12:52:18 PM By: Angelina Pih Entered By: Angelina Pih on 04/20/2023 12:52:18 -------------------------------------------------------------------------------- Lower Extremity Assessment Details Patient Name: Date of Service: [name] 04/20/2023 12:00 PM Medical Record Number: [id number] Patient Account Number: [id number] Date of Birth/Sex: Treating RN: Jun 18, 1955 (67 y.o. [name] Primary Care Alexah Kivett: [name] Other Clinician: Referring Rateel Beldin: Treating Arthor Gorter/Extender: Matthew Folks in Treatment: 39 Edema Assessment Assessed: [Left: No] [Right: No] Edema: [Left: Yes] [Right: Yes] Calf Left: Right: Point of Measurement: 36 cm From Medial Instep 56.2 cm 60 cm Ankle Left: Right: Point  of Measurement: 13 cm From Medial Instep 31 cm 32 cm Vascular Assessment Pulses: Dorsalis Pedis Palpable: [Left:Yes] [Right:Yes] Extremity colors, hair growth, and conditions: Extremity Color: [Left:Hyperpigmented] [Right:Hyperpigmented] Hair Growth on Extremity: [Left:No] [Right:No] Temperature of Extremity: [Left:Warm < 3 seconds] [Right:Warm < 3 seconds] Toe Nail Assessment Left: Right: Thick: Yes Yes Discolored: No No Deformed: No No Improper Length and Hygiene: No No Electronic Signature(s) Signed: 04/20/2023 4:26:12 PM By: Angelina Pih Entered By: Angelina Pih on 04/20/2023 12:19:24 [name] ([id number]) [id number] Page 4 of 9 -------------------------------------------------------------------------------- Multi Wound Chart Details Patient Name: Date of Service: [name] 04/20/2023 12:00 PM Medical Record Number: [id number] Patient Account Number: [id number] Date of Birth/Sex: Treating RN: 03/19/1956 (67 y.o. [name] Primary Care Aurora Rody: [name] Other Clinician: Referring Candus Braud: Treating Kameko Hukill/Extender: Matthew Folks in Treatment: 39 Vital Signs Height(in): 73 Pulse(bpm): 65 Weight(lbs): 358 Blood Pressure(mmHg): 114/66 Body Mass Index(BMI): 47.2 Temperature(F): 97.8 Respiratory Rate(breaths/min): 18 [3:Photos:] [N/A:N/A] Right Lower Leg N/A N/A Wound Location: Gradually Appeared N/A N/A Wounding Event: Lymphedema N/A N/A Primary Etiology: Arrhythmia, Congestive Heart Failure, N/A N/A Comorbid History: Hypertension, Type II Diabetes 02/04/2023 N/A N/A Date Acquired: 10 N/A N/A Weeks of Treatment: Open N/A N/A Wound Status: No N/A N/A Wound Recurrence: 0.4x0.6x0.1 N/A N/A Measurements L x W x D (cm) 0.188 N/A N/A A (cm) : rea 0.019 N/A N/A Volume (cm) : 84.70% N/A N/A % Reduction in A rea: 92.20% N/A N/A % Reduction in Volume: Partial Thickness N/A  N/A Classification: Medium N/A N/A Exudate A mount: Serosanguineous N/A N/A Exudate Type: red, brown N/A N/A Exudate Color: Medium (34-66%) N/A N/A Granulation A mount: Red N/A N/A Granulation Quality: Medium (34-66%) N/A N/A Necrotic A mount: Fat Layer (Subcutaneous Tissue): Yes N/A N/A Exposed Structures: Fascia: No Tendon: No Muscle: No Joint: No Bone: No Small (1-33%) N/A N/A Epithelialization: Treatment Notes Electronic Signature(s) Signed: 04/20/2023 4:26:12 PM By: Angelina Pih Entered By: Angelina Pih on 04/20/2023 12:32:34 [name] ([id number]) [id number] Page 5 of 9 -------------------------------------------------------------------------------- Multi-Disciplinary Care Plan Details Patient Name: Date of Service: [name] 04/20/2023 12:00 PM Medical Record Number: [id number] Patient Account Number: [id number] Date of Birth/Sex: Treating RN:  [birth year] (66 y.o. [name] Primary Care Leoma Folds: [name] Other Clinician: Referring Rayshawn Maney: Treating Calbert Hulsebus/Extender: Matthew Folks in Treatment: 39 Active Inactive Venous Leg Ulcer Nursing Diagnoses: Actual venous Insuffiency (use after diagnosis is confirmed) Goals: Patient will maintain optimal edema control Date Initiated: 10/15/2022 Target Resolution Date: 06/12/2023 Goal Status: Active Patient/caregiver will verbalize understanding of disease process and disease management Date Initiated: 10/15/2022 Date Inactivated: 11/12/2022 Target Resolution Date: 10/15/2022 Goal Status: Met Verify adequate tissue perfusion prior to therapeutic compression application Date Initiated: 10/15/2022 Date Inactivated: 11/12/2022 Target Resolution Date: 10/15/2022 Goal Status: Met Interventions: Assess peripheral edema status every visit. Compression as ordered Provide education on venous insufficiency Treatment Activities: Therapeutic  compression applied : 10/15/2022 Notes: Electronic Signature(s) Signed: 04/20/2023 12:51:03 PM By: Angelina Pih Entered By: Angelina Pih on 04/20/2023 12:51:03 -------------------------------------------------------------------------------- Non-Wound Condition Assessment Details Patient Name: Date of Service: Matthew Burly [name] 04/20/2023 12:00 PM Medical Record Number: [id number] Patient Account Number: [id number] Date of Birth/Sex: Treating RN: [birth year] (67 y.o. [name] Primary Care Alezander Dimaano: [name] Other Clinician: Referring Shamarion Coots: Treating Laithan Conchas/Extender: Kennan, Detter ([id number]) [id number] Page 6 of 9 Weeks in Treatment: 39 Non-Wound Condition: Condition: Lymphedema Location: Leg Side: Left Lymphedema Assessment Symptoms: (check all that apply) Edema - Progressive Notes small area to back of left leg noted to be open, measuring 0.3x0.5x0.1, minimal drainage Electronic Signature(s) Signed: 04/20/2023 12:22:33 PM By: Angelina Pih Entered By: Angelina Pih on 04/20/2023 12:22:33 -------------------------------------------------------------------------------- Pain Assessment Details Patient Name: Date of Service: Matthew Burly [name] 04/20/2023 12:00 PM Medical Record Number: [id number] Patient Account Number: [id number] Date of Birth/Sex: Treating RN: 1956/03/25 (67 y.o. [name] Primary Care Jessica Checketts: [name] Other Clinician: Referring Arrow Emmerich: Treating [name]/Extender: Matthew Folks in Treatment: 39 Active Problems Location of Pain Severity and Description of Pain Patient Has Paino Yes Site Locations Rate the pain. Current Pain Level: 5 Pain Management and Medication Current Pain Management: Electronic Signature(s) Signed: 04/20/2023 12:21:32 PM By: Angelina Pih Entered By: Angelina Pih on 04/20/2023 12:21:32 [name]  ([id number]) [id number] Page 7 of 9 -------------------------------------------------------------------------------- Patient/Caregiver Education Details Patient Name: Date of Service: [name] 11/5/2024andnbsp12:00 PM Medical Record Number: [id number] Patient Account Number: [id number] Date of Birth/Gender: Treating RN: 1956/03/02 (67 y.o. [name] Primary Care Physician: [name] Other Clinician: Referring Physician: Treating Physician/Extender: Matthew Folks in Treatment: 39 Education Assessment Education Provided To: Patient Education Topics Provided Wound/Skin Impairment: Handouts: Caring for Your Ulcer Methods: Explain/Verbal Responses: State content correctly Electronic Signature(s) Signed: 04/20/2023 4:26:12 PM By: Angelina Pih Entered By: Angelina Pih on 04/20/2023 12:51:14 -------------------------------------------------------------------------------- Wound Assessment Details Patient Name: Date of Service: Matthew Burly [name] 04/20/2023 12:00 PM Medical Record Number: [id number] Patient Account Number: [id number] Date of Birth/Sex: Treating RN: [birth year] (67 y.o. [name] Primary Care Kiasia Chou: [name] Other Clinician: Referring Abhay Godbolt: Treating Lunden Stieber/Extender: Matthew Folks in Treatment: 39 Wound Status Wound Number: 3 Primary Lymphedema Etiology: Wound Location: Right Lower Leg Wound Status: Open Wounding Event: Gradually Appeared Comorbid Arrhythmia, Congestive Heart Failure, Hypertension, Type II Date Acquired: 02/04/2023 History: Diabetes Weeks Of Treatment: 10 Clustered Wound: No Photos Matthew, [name] ([id number]) [id number] Page 8 of 9 Wound Measurements Length: (cm) 0.4 Width: (cm) 0.6 Depth: (cm) 0.1 Area: (cm) 0.188 Volume: (cm) 0.019 % Reduction in Area: 84.7% % Reduction in Volume:  92.2% Epithelialization: Small (1-33%) Tunneling: No Undermining: No  Wound Description Classification: Partial Thickness Exudate Amount: Medium Exudate Type: Serosanguineous Exudate Color: red, brown Foul Odor After Cleansing: No Slough/Fibrino Yes Wound Bed Granulation Amount: Medium (34-66%) Exposed Structure Granulation Quality: Red Fascia Exposed: No Necrotic Amount: Medium (34-66%) Fat Layer (Subcutaneous Tissue) Exposed: Yes Necrotic Quality: Adherent Slough Tendon Exposed: No Muscle Exposed: No Joint Exposed: No Bone Exposed: No Treatment Notes Wound #3 (Lower Leg) Wound Laterality: Right Cleanser Soap and Water Discharge Instruction: Gently cleanse wound with antibacterial soap, rinse and pat dry prior to dressing wounds Peri-Wound Care Topical Primary Dressing Silvercel Small 2x2 (in/in) Discharge Instruction: Apply Silvercel Small 2x2 (in/in) as instructed Secondary Dressing Coverlet Latex-Free Fabric Adhesive Dressings Discharge Instruction: 1.5 x 2 Secured With Compression Wrap Compression Stockings Add-Ons Electronic Signature(s) Signed: 04/20/2023 4:26:12 PM By: Angelina Pih Entered By: Angelina Pih on 04/20/2023 12:17:44 [name] ([id number]) [id number] Page 9 of 9 -------------------------------------------------------------------------------- Vitals Details Patient Name: Date of Service: [name] 04/20/2023 12:00 PM Medical Record Number: [id number] Patient Account Number: [id number] Date of Birth/Sex: Treating RN: 06-Dec-1955 (67 y.o. [name] Primary Care Anuhea Gassner: [name] Other Clinician: Referring Jonerik Sliker: Treating Jinnifer Montejano/Extender: Matthew Folks in Treatment: 39 Vital Signs Time Taken: 12:10 Temperature (F): 97.8 Height (in): 73 Pulse (bpm): 65 Weight (lbs): 358 Respiratory Rate (breaths/min): 18 Body Mass Index (BMI): 47.2 Blood Pressure (mmHg):  114/66 Reference Range: 80 - 120 mg / dl Electronic Signature(s) Signed: 04/20/2023 12:21:22 PM By: Angelina Pih Entered By: Angelina Pih on 04/20/2023 12:21:21

## 2023-04-27 ENCOUNTER — Encounter: Payer: PPO | Admitting: Physician Assistant

## 2023-04-27 DIAGNOSIS — E11622 Type 2 diabetes mellitus with other skin ulcer: Secondary | ICD-10-CM | POA: Diagnosis not present

## 2023-04-27 DIAGNOSIS — I890 Lymphedema, not elsewhere classified: Secondary | ICD-10-CM | POA: Diagnosis not present

## 2023-04-27 DIAGNOSIS — L97812 Non-pressure chronic ulcer of other part of right lower leg with fat layer exposed: Secondary | ICD-10-CM | POA: Diagnosis not present

## 2023-04-27 NOTE — Progress Notes (Addendum)
MESIAH, MACAK ([id number]) [id number] Page 1 of 9 Visit Report for 04/27/2023 Chief Complaint Document Details Patient Name: Date of Service: [name] 04/27/2023 9:45 A M Medical Record Number: [id number] Patient Account Number: [id number] Date of Birth/Sex: Treating RN: 27-Dec-1955 (67 y.o. [name] Primary Care Provider: [name] Other Clinician: Referring Provider: Treating Provider/Extender: Matthew Folks in Treatment: 40 Information Obtained from: Patient Chief Complaint Bilateral LE ulcers and lymphedema Electronic Signature(s) Signed: 04/27/2023 10:15:00 AM By: Allen Derry PA-C Entered By: Allen Derry on 04/27/2023 10:15:00 -------------------------------------------------------------------------------- HPI Details Patient Name: Date of Service: [name], [name] 04/27/2023 9:45 A M Medical Record Number: [id number] Patient Account Number: [id number] Date of Birth/Sex: Treating RN: 1956/04/12 (67 y.o. [name] Primary Care Provider: [name] Other Clinician: Referring Provider: Treating Provider/Extender: Matthew Folks in Treatment: 40 History of Present Illness HPI Description: 07-16-2022 upon evaluation today patient appears to be doing poorly currently in regard to his his legs. The left leg is actually worse than the right. This is a patient that is previously been seen in the [place] office and at that point was doing really quite well with compression wrapping and often alginate are either Hydrofera Blue dressings. Fortunately he has gone since November 2020 until just current with out having any issue or need for wound care services which is great news. Unfortunately he is here today because he is having some issues at this point. Patient does have a history of several medical conditions which include diabetes mellitus type 2, lymphedema, hypertension,  congestive heart failure, and cardiac arrhythmia though is not sure that this is actually atrial fibrillation based on what he has been told. He does have lymphedema pumps but tells me he has not used them since the summer when he had to move to a remodel of his building and subsequently never unpacked them. 07-23-2022 upon evaluation today patient appears to be doing well with regard to his legs. He is going require some sharp debridement today but overall seems to be making some progress. I am pleased in that regard he has much less drainage that he had did last week I think the compression wraps are definitely helping. 07-30-2022 upon evaluation today patient appears to be doing poorly in regard to his legs he actually has blue-green drainage which I think is consistent with Pseudomonas. I believe that we need to address this as soon as possible and I discussed that with the patient today as well. Fortunately I do not see any signs of active infection locally nor systemically at this time which is great news. No fevers, chills, nausea, vomiting, or diarrhea. SAFIR, TAULBEE ([id number]) [id number] Page 2 of 9 08-13-2022 upon evaluation today patient appears to be doing well currently in regard to his leg ulcers which are actually looking much better. Fortunately there does not appear to be any signs of active infection at this time which is great news. No fevers, chills, nausea, vomiting, or diarrhea. 3/7; patient with predominantly a left medial lower extremity wound as well as several smaller areas and a small area on the right lateral. His Jodie Echevaria came in today. We are using silver alginate as the primary dressing 09-03-2022 upon evaluation today patient appears to be doing well currently in regard to his wounds. He has been tolerating the dressing changes without complication. Fortunately there does not appear to be any signs of infection there is needed however for  sharp  debridement. 09-10-2022 upon evaluation today patient appears to be doing well currently in regard to his wound. Has been tolerating the dressing changes without complication. Fortunately there does not appear to be any signs of infection looking systemically which is great news and overall I am extremely pleased with where we stand today. I do not see any signs of infection. 09-17-2022 upon evaluation today patient actually is making signs of improvement the good news is he is making good improvement. With that being said he still is having quite a time keeping the swelling down. We have been using Tubigrip I think that still probably our best option but nonetheless I think that we are making progress with regard to his wounds. 09-24-2022 upon evaluation today patient appears to be doing well currently in regard to his wounds is continuing to make improvements at this point which is great news and overall I am extremely pleased with where we stand today. Fortunately I do not see any evidence of active infection at this time which is great news and in general I think he is doing quite well with the [facility] and silver alginate. 10-01-2022 upon evaluation today patient appears to be doing pretty well currently in regard to his wounds. He has been tolerating the dressing changes without complication. Fortunately I do not see any evidence of active infection locally nor systemically which is great news and I do believe that 10-08-2022 upon evaluation today patient's wounds actually are showing some signs of improvement which is great news. Still this is very slow to heal I really feel like he would do better if we can get him in some stronger compression wrapping he does have the juxta lite compression wraps therefore we can actually see about doing that over top of his Tubigrip to see if that can be beneficial. He does not love the hide he had as they are not the most comfortable thing for him but  nonetheless I think it would be beneficial he is in agreement with going forward with this. 10-15-2022 upon evaluation today patient appears to be doing well currently in regard to his wounds that do not appear to be infected which is good news. With that being said unfortunately he still continues to have significant amount of swelling we really need to do something to try to get the swelling under control. I do not see any signs of infection locally nor systemically but at the same time the amount of edema is tremendous. The juxta lite helps but again he is only taking the fluid pills once or twice a week due to the amount that makes him go to the restroom. Therefore he does not take this daily which is really what he needs on a more regular basis. We need to try to get some compression on and is a little bit stronger. 10-22-2022 upon evaluation today patient actually appears to be making some really good progress in regard to his wound. The compression wrap was put on last time actually doing a great job he looks much improved and in general I feel like that we are making great progress. I do not see any evidence of active infection locally nor systemically which is great news. 10-29-2022 upon evaluation today patient appears to be doing decently well in regard to his legs currently. I feel like that his swelling is much better I feel like that he is also doing much better in regard to the wounds in general. I do not see any  signs of active infection which is great news and in general I think that he is making excellent progress towards complete resolution. The patient does seem to have 1 issue that is with his wraps actually slipping down working I definitely need to work on that aspect. 11-05-2022 upon evaluation today patient appears to be doing excellent in regard to his wound. He has been tolerating the dressing changes without complication. He actually seems to be making excellent progress I am  extremely pleased with where things stand currently. I do believe that we are really making good progress and I think that we are on the right track towards complete closure I think the compression wrap has been a dramatic shift in a good way for him as far as getting the wounds healed a lot of these areas that we will continue to leave Completely sealed up. 11-12-2022 upon evaluation today patient appears to be doing well currently in regard to his leg ulcer. The compression wrapping seems to be doing an excellent job. Fortunately I do not see any evidence of active infection locally nor systemically which is great news and in general I do believe that we are making good headway here towards complete closure. 11-19-2022 upon evaluation today patient appears to be doing well currently in regard to his wounds. He is actually showing signs of excellent improvement and very pleased with where we stand I think that he is making great progress. I do not see any evidence of infection at this time which is great news. 11-26-2022 upon evaluation today patient appears to be doing well currently in regard to his wound. He is actually been tolerating the dressing changes without complication. Fortunately I do not see any evidence of active infection locally nor systemically which is great news I think he is making excellent progress here towards closure. 12-03-2022 upon evaluation today patient appears to be doing well currently in regard to his wound. He in fact at both locations is doing great and seems to be making excellent progress and very pleased in that regard. I do not see any signs of active infection at this time. 12-10-2022 upon evaluation patient's wound is actually significantly smaller with one of the satellite lesions closed and there is just 1 small area remaining and this is actually doing quite well. I am very pleased with her progress. 12-15-2022 upon evaluation today patient appears to be doing  actually pretty well in regard to his wound on the left leg. He does have a little area on the backside that open that was previously close last week due to the fact that his wrap got wet over the weekend and he had to make do with stuff they can find at the drugstore. They made this work out however and the good news is he actually seems to be doing significantly better and the wound looks fine except for the fact that he has a couple of areas that just reopen on the backside very tiny. Nonetheless I do believe that we can go ahead and see what we do about trying to get this moving in the right direction yet again. 12-22-2022 upon evaluation today patient appears to be doing poorly in regard to his wrap slid down and his leg is extremely swollen. It is also painful secondary to it being so swollen. Fortunately I do not see any evidence of infection right now but at the same time I do believe that if we do not get the swelling under control  is not really good to alleviate his discomfort. We definitely can need to change his wrap out however in short order. 12-31-2022 upon evaluation today patient appears to be doing well currently in regard to his wound. He has been tolerating the dressing changes without complication. This actually looks to be doing much better today compared to where we were. Fortunately I do not see any evidence of active infection locally or systemically which is great news. 01-11-2023 upon evaluation today patient appears to be doing well currently in regard to his wound which is actually showing signs of significant improvement. Fortunately I do not see any signs of infection locally or systemically which is great news and in general I do believe that we are making good headway towards complete closure. 01-19-2023 upon evaluation today patient appears to be doing well currently hemoglobin to his wound. He has been tolerating the dressing changes without complication. Fortunately the  wound is not infected unfortunately it is a little bigger due to the fact that the wrap slid down. I do not see any signs of worsening or infection at this time. 01-28-2023 upon evaluation today patient appears to be doing well currently in regard to his wound. He has been tolerating the dressing changes without complication. Fortunately I do not see any signs of infection the compression wrap is doing a really good job and I think it worked pretty much just about healed. NADARIUS, SUNDERLIN ([id number]) [id number] Page 3 of 9 02-04-2023 upon evaluation today patient appears to be doing excellent in regard to his wounds. In fact he is pretty much completely healed on the left although I think this may have just a pinpoint opening. Nonetheless I think it is 1 more week to toughen up. 02-16-2023 upon evaluation today patient appears to be doing well currently in regard to his wound. He has been tolerating the dressing changes without complication. Fortunately there does not appear to be any evidence of active infection locally or systemically which is good news. In fact the original wounds we been taking care of on the left and right legs are completely healed unfortunately his wrap slipped down the right leg and there are 2 small areas on the anterior portion of his leg which are open at this point. When I wrapped his right leg the left leg is completely closed however he is in agreement to his own compression sock. 02-26-2023 upon evaluation today patient appears to be doing well currently in regard to his left leg which is still close in the right leg is doing significantly better. Fortunately there does not appear to be any signs of active infection locally or systemically which is great news and in general I do believe that we will making headway towards complete closure. 03-05-2023 upon evaluation today patient actually appears to be doing excellent in regard to his wounds one  of the areas healed the other is significantly improved. I am actually very pleased with where we stand today and I think that he is doing well with his compression stockings which is excellent as well. 03-19-2023 upon evaluation today patient appears to be doing poorly currently in regard to his wounds. He seems to be doing a bit worse as far as both legs are concerned as far as open wounds and I think that we are going need to address this rapidly to keep it from backtracking and getting significantly worse. He voiced understanding he kind of somewhat expected I believe. 03-29-2023 upon evaluation patient actually  showing signs of definite improvement with regard to his wounds currently with some already drying up and on the right lateral leg this is much smaller. Will continue to wrap him this seems to be doing quite well. 04-06-2023 upon evaluation today patient's wounds on the legs actually appear to be doing better which is great news. Fortunately I do not see any evidence of worsening overall and I do believe that the patient is making good headway towards closure which is good news. I do not see any signs of infection which is also excellent. He actually only had the wraps on for a couple of days before they started slipping down he had cut them off and therefore spent the remaining 5 days pretty much just in his compression socks he seems of done very well though. 04-13-2023 upon evaluation patient is down to just 1 wound on the right leg everything appears to be doing excellent I think he is making excellent progress towards complete closure. Fortunately I do not see any signs of active infection locally or systemically which is great news. 04-20-2023 upon evaluation today patient appears to be doing well currently in regard to his wound. He has been tolerating the dressing changes without complication. Fortunately there does not appear to be any signs of active infection at this time. No  fevers, chills, nausea, vomiting, or diarrhea. 04-27-2023 upon evaluation today patient appears to be doing a little worse with some irritation around the sides of his wounds. I am not sure if he has been wearing his compression all the time but he does tell me which I Cellerate well that his dressings were not quite in place and therefore there is more drainage and irritation I think due to the fact that he had accidentally put it in the wrong location on his right leg. Nonetheless the wound does not Bigger but this just some irritation of the skin around. Electronic Signature(s) Signed: 04/27/2023 11:14:46 AM By: Allen Derry PA-C Entered By: Allen Derry on 04/27/2023 11:14:45 -------------------------------------------------------------------------------- Physical Exam Details Patient Name: Date of Service: [name] 04/27/2023 9:45 A M Medical Record Number: [id number] Patient Account Number: [id number] Date of Birth/Sex: Treating RN: 30-Aug-1955 (68 y.o. [name] Primary Care Provider: [name] Other Clinician: Referring Provider: Treating Provider/Extender: Matthew Folks in Treatment: 40 Constitutional Well-nourished and well-hydrated in no acute distress. Respiratory normal breathing without difficulty. Psychiatric this patient is able to make decisions and demonstrates good insight into disease process. Alert and Oriented x 3. pleasant and cooperative. Notes Upon inspection patient's wounds are showing signs of doing well there does not appear to be any signs of active infection at this time with that being said I do believe that the patient is making headway towards getting this to close although it is very slow. Electronic Signature(s) Signed: 04/27/2023 11:15:41 AM By: Franz Dell ([id number]) AM By: Dionne Ano [phone].pdf Page 4 of 9 Signed: 04/27/2023 11:15:41 Entered By:  Allen Derry on 04/27/2023 11:15:41 -------------------------------------------------------------------------------- Physician Orders Details Patient Name: Date of Service: [name] 04/27/2023 9:45 A M Medical Record Number: [id number] Patient Account Number: [id number] Date of Birth/Sex: Treating RN: [birth year] (66 y.o. [name] Primary Care Provider: [name] Other Clinician: Referring Provider: Treating Provider/Extender: Matthew Folks in Treatment: 40 The following information was scribed by: Angelina Pih The information was scribed for: Allen Derry Verbal / Phone Orders: No Diagnosis Coding ICD-10  Coding Code Description E11.622 Type 2 diabetes mellitus with other skin ulcer I89.0 Lymphedema, not elsewhere classified L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 L97.812 Non-pressure chronic ulcer of other part of right lower leg with fat layer exposed I10 Essential (primary) hypertension I50.42 Chronic combined systolic (congestive) and diastolic (congestive) heart failure I49.9 Cardiac arrhythmia, unspecified Follow-up Appointments Return Appointment in 1 week. Nurse Visit as needed Bathing/ Shower/ Hygiene May shower with wound dressing protected with water repellent cover or cast protector. Anesthetic (Use 'Patient Medications' Section for Anesthetic Order Entry) Lidocaine applied to wound bed Edema Control - Orders / Instructions Patient to wear own compression stockings. Remove compression stockings every night before going to bed and put on every morning when getting up. Elevate, Exercise Daily and A void Standing for Long Periods of Time. Elevate leg(s) parallel to the floor when sitting. DO YOUR BEST to sleep in the bed at night. DO NOT sleep in your recliner. Long hours of sitting in a recliner leads to swelling of the legs and/or potential wounds on your backside. Non-Wound  Condition Right Lower Extremity dditional non-wound orders/instructions: - A and D and abd to right heel, small piece of silvercell to area on LLL A Wound Treatment Wound #3 - Lower Leg Wound Laterality: Right Cleanser: Soap and Water 1 x Per Day/30 Days Discharge Instructions: Gently cleanse wound with antibacterial soap, rinse and pat dry prior to dressing wounds Prim Dressing: Silvercel Small 2x2 (in/in) 1 x Per Day/30 Days ary Discharge Instructions: Apply Silvercel Small 2x2 (in/in) as instructed Secondary Dressing: Coverlet Latex-Free Fabric Adhesive Dressings 1 x Per Day/30 Days Discharge Instructions: 1.5 x 2 WYNDELL, LEPINE ([id number]) [id number] Page 5 of 9 Electronic Signature(s) Signed: 04/27/2023 4:21:31 PM By: Angelina Pih Signed: 04/29/2023 7:57:50 AM By: Allen Derry PA-C Previous Signature: 04/27/2023 3:35:54 PM Version By: Angelina Pih Entered By: Angelina Pih on 04/27/2023 15:52:39 -------------------------------------------------------------------------------- Problem List Details Patient Name: Date of Service: [name] 04/27/2023 9:45 A M Medical Record Number: [id number] Patient Account Number: [id number] Date of Birth/Sex: Treating RN: 02-Nov-1955 (67 y.o. [name] Primary Care Provider: [name] Other Clinician: Referring Provider: Treating Provider/Extender: Matthew Folks in Treatment: 40 Active Problems ICD-10 Encounter Code Description Active Date MDM Diagnosis E11.622 Type 2 diabetes mellitus with other skin ulcer 07/16/2022 No Yes I89.0 Lymphedema, not elsewhere classified 07/16/2022 No Yes L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2/06/2022 No Yes L97.812 Non-pressure chronic ulcer of other part of right lower leg with fat layer 07/16/2022 No Yes exposed I10 Essential (primary) hypertension 07/16/2022 No Yes I50.42 Chronic  combined systolic (congestive) and diastolic (congestive) heart failure 07/16/2022 No Yes I49.9 Cardiac arrhythmia, unspecified 07/16/2022 No Yes Inactive Problems Resolved Problems Electronic Signature(s) Signed: 04/27/2023 10:14:55 AM By: Allen Derry PA-C Entered By: Allen Derry on 04/27/2023 10:14:55 [name] ([id number]) [id number] Page 6 of 9 -------------------------------------------------------------------------------- Progress Note Details Patient Name: Date of Service: [name] 04/27/2023 9:45 A M Medical Record Number: [id number] Patient Account Number: [id number] Date of Birth/Sex: Treating RN: 07/03/1955 (67 y.o. [name] Primary Care Provider: [name] Other Clinician: Referring Provider: Treating Provider/Extender: Matthew Folks in Treatment: 40 Subjective Chief Complaint Information obtained from Patient Bilateral LE ulcers and lymphedema History of Present Illness (HPI) 07-16-2022 upon evaluation today patient appears to be doing poorly currently in regard to his his  legs. The left leg is actually worse than the right. This is a patient that is previously been seen in the [place] office and at that point was doing really quite well with compression wrapping and often alginate are either Hydrofera Blue dressings. Fortunately he has gone since November 2020 until just current with out having any issue or need for wound care services which is great news. Unfortunately he is here today because he is having some issues at this point. Patient does have a history of several medical conditions which include diabetes mellitus type 2, lymphedema, hypertension, congestive heart failure, and cardiac arrhythmia though is not sure that this is actually atrial fibrillation based on what he has been told. He does have lymphedema pumps but tells me he has not used them since the summer when he had to move to a  remodel of his building and subsequently never unpacked them. 07-23-2022 upon evaluation today patient appears to be doing well with regard to his legs. He is going require some sharp debridement today but overall seems to be making some progress. I am pleased in that regard he has much less drainage that he had did last week I think the compression wraps are definitely helping. 07-30-2022 upon evaluation today patient appears to be doing poorly in regard to his legs he actually has blue-green drainage which I think is consistent with Pseudomonas. I believe that we need to address this as soon as possible and I discussed that with the patient today as well. Fortunately I do not see any signs of active infection locally nor systemically at this time which is great news. No fevers, chills, nausea, vomiting, or diarrhea. 08-13-2022 upon evaluation today patient appears to be doing well currently in regard to his leg ulcers which are actually looking much better. Fortunately there does not appear to be any signs of active infection at this time which is great news. No fevers, chills, nausea, vomiting, or diarrhea. 3/7; patient with predominantly a left medial lower extremity wound as well as several smaller areas and a small area on the right lateral. His Jodie Echevaria came in today. We are using silver alginate as the primary dressing 09-03-2022 upon evaluation today patient appears to be doing well currently in regard to his wounds. He has been tolerating the dressing changes without complication. Fortunately there does not appear to be any signs of infection there is needed however for sharp debridement. 09-10-2022 upon evaluation today patient appears to be doing well currently in regard to his wound. Has been tolerating the dressing changes without complication. Fortunately there does not appear to be any signs of infection looking systemically which is great news and overall I am extremely pleased with where  we stand today. I do not see any signs of infection. 09-17-2022 upon evaluation today patient actually is making signs of improvement the good news is he is making good improvement. With that being said he still is having quite a time keeping the swelling down. We have been using Tubigrip I think that still probably our best option but nonetheless I think that we are making progress with regard to his wounds. 09-24-2022 upon evaluation today patient appears to be doing well currently in regard to his wounds is continuing to make improvements at this point which is great news and overall I am extremely pleased with where we stand today. Fortunately I do not see any evidence of active infection at this time which is great news and in general I  think he is doing quite well with the [facility] and silver alginate. 10-01-2022 upon evaluation today patient appears to be doing pretty well currently in regard to his wounds. He has been tolerating the dressing changes without complication. Fortunately I do not see any evidence of active infection locally nor systemically which is great news and I do believe that 10-08-2022 upon evaluation today patient's wounds actually are showing some signs of improvement which is great news. Still this is very slow to heal I really feel like he would do better if we can get him in some stronger compression wrapping he does have the juxta lite compression wraps therefore we can actually see about doing that over top of his Tubigrip to see if that can be beneficial. He does not love the hide he had as they are not the most comfortable thing for him but nonetheless I think it would be beneficial he is in agreement with going forward with this. 10-15-2022 upon evaluation today patient appears to be doing well currently in regard to his wounds that do not appear to be infected which is good news. With that being said unfortunately he still continues to have significant amount of swelling  we really need to do something to try to get the swelling under control. I do not see any signs of infection locally nor systemically but at the same time the amount of edema is tremendous. The juxta lite helps but again he is only taking the fluid pills once or twice a week due to the amount that makes him go to the restroom. Therefore he does not take this daily which is really what he needs on a more regular basis. We need to try to get some compression on and is a little bit stronger. 10-22-2022 upon evaluation today patient actually appears to be making some really good progress in regard to his wound. The compression wrap was put on last time actually doing a great job he looks much improved and in general I feel like that we are making great progress. I do not see any evidence of active infection locally nor systemically which is great news. 10-29-2022 upon evaluation today patient appears to be doing decently well in regard to his legs currently. I feel like that his swelling is much better I feel like that he is also doing much better in regard to the wounds in general. I do not see any signs of active infection which is great news and in general I think that he DAYSEN, SCHELLHASE ([id number]) [id number] Page 7 of 9 is making excellent progress towards complete resolution. The patient does seem to have 1 issue that is with his wraps actually slipping down working I definitely need to work on that aspect. 11-05-2022 upon evaluation today patient appears to be doing excellent in regard to his wound. He has been tolerating the dressing changes without complication. He actually seems to be making excellent progress I am extremely pleased with where things stand currently. I do believe that we are really making good progress and I think that we are on the right track towards complete closure I think the compression wrap has been a dramatic shift in a good way for him as far  as getting the wounds healed a lot of these areas that we will continue to leave Completely sealed up. 11-12-2022 upon evaluation today patient appears to be doing well currently in regard to his leg ulcer. The compression wrapping seems to be doing an  excellent job. Fortunately I do not see any evidence of active infection locally nor systemically which is great news and in general I do believe that we are making good headway here towards complete closure. 11-19-2022 upon evaluation today patient appears to be doing well currently in regard to his wounds. He is actually showing signs of excellent improvement and very pleased with where we stand I think that he is making great progress. I do not see any evidence of infection at this time which is great news. 11-26-2022 upon evaluation today patient appears to be doing well currently in regard to his wound. He is actually been tolerating the dressing changes without complication. Fortunately I do not see any evidence of active infection locally nor systemically which is great news I think he is making excellent progress here towards closure. 12-03-2022 upon evaluation today patient appears to be doing well currently in regard to his wound. He in fact at both locations is doing great and seems to be making excellent progress and very pleased in that regard. I do not see any signs of active infection at this time. 12-10-2022 upon evaluation patient's wound is actually significantly smaller with one of the satellite lesions closed and there is just 1 small area remaining and this is actually doing quite well. I am very pleased with her progress. 12-15-2022 upon evaluation today patient appears to be doing actually pretty well in regard to his wound on the left leg. He does have a little area on the backside that open that was previously close last week due to the fact that his wrap got wet over the weekend and he had to make do with stuff they can find at  the drugstore. They made this work out however and the good news is he actually seems to be doing significantly better and the wound looks fine except for the fact that he has a couple of areas that just reopen on the backside very tiny. Nonetheless I do believe that we can go ahead and see what we do about trying to get this moving in the right direction yet again. 12-22-2022 upon evaluation today patient appears to be doing poorly in regard to his wrap slid down and his leg is extremely swollen. It is also painful secondary to it being so swollen. Fortunately I do not see any evidence of infection right now but at the same time I do believe that if we do not get the swelling under control is not really good to alleviate his discomfort. We definitely can need to change his wrap out however in short order. 12-31-2022 upon evaluation today patient appears to be doing well currently in regard to his wound. He has been tolerating the dressing changes without complication. This actually looks to be doing much better today compared to where we were. Fortunately I do not see any evidence of active infection locally or systemically which is great news. 01-11-2023 upon evaluation today patient appears to be doing well currently in regard to his wound which is actually showing signs of significant improvement. Fortunately I do not see any signs of infection locally or systemically which is great news and in general I do believe that we are making good headway towards complete closure. 01-19-2023 upon evaluation today patient appears to be doing well currently hemoglobin to his wound. He has been tolerating the dressing changes without complication. Fortunately the wound is not infected unfortunately it is a little bigger due to the fact that the  wrap slid down. I do not see any signs of worsening or infection at this time. 01-28-2023 upon evaluation today patient appears to be doing well currently in regard to his  wound. He has been tolerating the dressing changes without complication. Fortunately I do not see any signs of infection the compression wrap is doing a really good job and I think it worked pretty much just about healed. 02-04-2023 upon evaluation today patient appears to be doing excellent in regard to his wounds. In fact he is pretty much completely healed on the left although I think this may have just a pinpoint opening. Nonetheless I think it is 1 more week to toughen up. 02-16-2023 upon evaluation today patient appears to be doing well currently in regard to his wound. He has been tolerating the dressing changes without complication. Fortunately there does not appear to be any evidence of active infection locally or systemically which is good news. In fact the original wounds we been taking care of on the left and right legs are completely healed unfortunately his wrap slipped down the right leg and there are 2 small areas on the anterior portion of his leg which are open at this point. When I wrapped his right leg the left leg is completely closed however he is in agreement to his own compression sock. 02-26-2023 upon evaluation today patient appears to be doing well currently in regard to his left leg which is still close in the right leg is doing significantly better. Fortunately there does not appear to be any signs of active infection locally or systemically which is great news and in general I do believe that we will making headway towards complete closure. 03-05-2023 upon evaluation today patient actually appears to be doing excellent in regard to his wounds one of the areas healed the other is significantly improved. I am actually very pleased with where we stand today and I think that he is doing well with his compression stockings which is excellent as well. 03-19-2023 upon evaluation today patient appears to be doing poorly currently in regard to his wounds. He seems to be doing a bit  worse as far as both legs are concerned as far as open wounds and I think that we are going need to address this rapidly to keep it from backtracking and getting significantly worse. He voiced understanding he kind of somewhat expected I believe. 03-29-2023 upon evaluation patient actually showing signs of definite improvement with regard to his wounds currently with some already drying up and on the right lateral leg this is much smaller. Will continue to wrap him this seems to be doing quite well. 04-06-2023 upon evaluation today patient's wounds on the legs actually appear to be doing better which is great news. Fortunately I do not see any evidence of worsening overall and I do believe that the patient is making good headway towards closure which is good news. I do not see any signs of infection which is also excellent. He actually only had the wraps on for a couple of days before they started slipping down he had cut them off and therefore spent the remaining 5 days pretty much just in his compression socks he seems of done very well though. 04-13-2023 upon evaluation patient is down to just 1 wound on the right leg everything appears to be doing excellent I think he is making excellent progress towards complete closure. Fortunately I do not see any signs of active infection locally or systemically which  is great news. 04-20-2023 upon evaluation today patient appears to be doing well currently in regard to his wound. He has been tolerating the dressing changes without complication. Fortunately there does not appear to be any signs of active infection at this time. No fevers, chills, nausea, vomiting, or diarrhea. 04-27-2023 upon evaluation today patient appears to be doing a little worse with some irritation around the sides of his wounds. I am not sure if he has been wearing his compression all the time but he does tell me which I Cellerate well that his dressings were not quite in place and  therefore there is more drainage and irritation I think due to the fact that he had accidentally put it in the wrong location on his right leg. Nonetheless the wound does not Bigger but this just some irritation of the skin around. DELL, SANTELLAN ([id number]) [id number] Page 8 of 9 Objective Constitutional Well-nourished and well-hydrated in no acute distress. Vitals Time Taken: 10:05 AM, Height: 73 in, Weight: 358 lbs, BMI: 47.2, Temperature: 97.9 F, Pulse: 72 bpm, Respiratory Rate: 18 breaths/min, Blood Pressure: 114/67 mmHg. Respiratory normal breathing without difficulty. Psychiatric this patient is able to make decisions and demonstrates good insight into disease process. Alert and Oriented x 3. pleasant and cooperative. General Notes: Upon inspection patient's wounds are showing signs of doing well there does not appear to be any signs of active infection at this time with that being said I do believe that the patient is making headway towards getting this to close although it is very slow. Integumentary (Hair, Skin) Wound #3 status is Open. Original cause of wound was Gradually Appeared. The date acquired was: 02/04/2023. The wound has been in treatment 11 weeks. The wound is located on the Right Lower Leg. The wound measures 0.4cm length x 0.4cm width x 0.1cm depth; 0.126cm^2 area and 0.013cm^3 volume. There is Fat Layer (Subcutaneous Tissue) exposed. There is no tunneling or undermining noted. There is a medium amount of serosanguineous drainage noted. There is large (67-100%) red granulation within the wound bed. There is a small (1-33%) amount of necrotic tissue within the wound bed including Adherent Slough. General Notes: excoriation surrounding Other Condition(s) Patient presents with Lymphedema located on the Left Leg. General Notes: some open areas noted, measuring 3x4x0.1 Assessment Active Problems ICD-10 Type 2 diabetes mellitus with other  skin ulcer Lymphedema, not elsewhere classified Non-pressure chronic ulcer of other part of left lower leg with fat layer exposed Non-pressure chronic ulcer of other part of right lower leg with fat layer exposed Essential (primary) hypertension Chronic combined systolic (congestive) and diastolic (congestive) heart failure Cardiac arrhythmia, unspecified Plan 1. Based on what I am seeing I do believe that the patient can continue to try to utilize his own compression stockings for the next week. However I explained to him that if is not doing significantly better come next week we may need to go ahead and put her back in the compression wrap he would prefer not but again I do not want this to get out of control. 2. I am going to recommend as well that he should continue with the silver alginate dressing I think that still the best way to go currently. We will see patient back for reevaluation in 1 week here in the clinic. If anything worsens or changes patient will contact our office for additional recommendations. Electronic Signature(s) Signed: 04/27/2023 11:16:11 AM By: Allen Derry PA-C Entered By: Allen Derry on 04/27/2023 11:16:11 Pusch, Beatrix Shipper (  [id number]) [id number] Page 9 of 9 -------------------------------------------------------------------------------- SuperBill Details Patient Name: Date of Service: [name] 04/27/2023 Medical Record Number: [id number] Patient Account Number: [id number] Date of Birth/Sex: Treating RN: 07/22/1955 (67 y.o. [name] Primary Care Provider: [name] Other Clinician: Referring Provider: Treating Provider/Extender: Matthew Folks in Treatment: 40 Diagnosis Coding ICD-10 Codes Code Description E11.622 Type 2 diabetes mellitus with other skin ulcer I89.0 Lymphedema, not elsewhere classified L97.822 Non-pressure chronic ulcer of other part of left lower leg with fat layer  exposed L97.812 Non-pressure chronic ulcer of other part of right lower leg with fat layer exposed I10 Essential (primary) hypertension I50.42 Chronic combined systolic (congestive) and diastolic (congestive) heart failure I49.9 Cardiac arrhythmia, unspecified Facility Procedures : CPT4 Code: [id number] Description: 99213 - WOUND CARE VISIT-LEV 3 EST PT Modifier: Quantity: 1 Physician Procedures : CPT4 Code Description Modifier [id number] 99213 - WC PHYS LEVEL 3 - EST PT ICD-10 Diagnosis Description E11.622 Type 2 diabetes mellitus with other skin ulcer I89.0 Lymphedema, not elsewhere classified L97.822 Non-pressure chronic ulcer of other part of  left lower leg with fat layer exposed L97.812 Non-pressure chronic ulcer of other part of right lower leg with fat layer exposed Quantity: 1 Electronic Signature(s) Signed: 04/27/2023 3:53:24 PM By: Angelina Pih Signed: 04/29/2023 7:57:50 AM By: Allen Derry PA-C Previous Signature: 04/27/2023 11:16:23 AM Version By: Allen Derry PA-C Entered By: Angelina Pih on 04/27/2023 15:53:23

## 2023-04-27 NOTE — Progress Notes (Signed)
ZYLER, GAYE ([id number]) [id number] Page 1 of 10 Visit Report for 04/27/2023 Arrival Information Details Patient Name: Date of Service: [name] 04/27/2023 9:45 A M Medical Record Number: [id number] Patient Account Number: [id number] Date of Birth/Sex: Treating RN: [birth year] (67 y.o. [name] Primary Care [name]: [name] Other Clinician: Referring [name]: Treating [name]/Extender: [name] in Treatment: 40 Visit Information History Since Last Visit Added or deleted any medications: No Patient Arrived: [name] Any new allergies or adverse reactions: No Arrival Time: 10:00 Had a fall or experienced change in No Accompanied By: self activities of daily living that may affect Transfer Assistance: None risk of falls: Patient Identification Verified: Yes Hospitalized since last visit: No Secondary Verification Process Completed: Yes Has Dressing in Place as Prescribed: Yes Patient Requires Transmission-Based Precautions: No Has Compression in Place as Prescribed: Yes Patient Has Alerts: No Pain Present Now: Yes Electronic Signature(s) Signed: 04/27/2023 10:19:36 AM By: [name] Entered By: [name] on 04/27/2023 07:19:36 -------------------------------------------------------------------------------- Clinic Level of Care Assessment Details Patient Name: Date of Service: [name] 04/27/2023 9:45 A M Medical Record Number: [id number] Patient Account Number: [id number] Date of Birth/Sex: Treating RN: 04-15-1956 (67 y.o. [name] Primary Care Jaquia Benedicto: [name] Other Clinician: Referring [name]: Treating [name]/Extender: [name] in Treatment: 40 Clinic Level of Care Assessment Items TOOL 4 Quantity Score []  - 0 Use when only an EandM is performed on FOLLOW-UP visit ASSESSMENTS - Nursing Assessment / Reassessment X- 1  10 Reassessment of Co-morbidities (includes updates in patient status) X- 1 5 Reassessment of Adherence to Treatment Plan ASSESSMENTS - Wound and Skin A ssessment / Reassessment X - Simple Wound Assessment / Reassessment - one wound 1 5 [name], [name] ([id number]) [id number] Page 2 of 10 []  - 0 Complex Wound Assessment / Reassessment - multiple wounds []  - 0 Dermatologic / Skin Assessment (not related to wound area) ASSESSMENTS - Focused Assessment X- 1 5 Circumferential Edema Measurements - multi extremities []  - 0 Nutritional Assessment / Counseling / Intervention []  - 0 Lower Extremity Assessment (monofilament, tuning fork, pulses) []  - 0 Peripheral Arterial Disease Assessment (using hand held doppler) ASSESSMENTS - Ostomy and/or Continence Assessment and Care []  - 0 Incontinence Assessment and Management []  - 0 Ostomy Care Assessment and Management (repouching, etc.) PROCESS - Coordination of Care X - Simple Patient / Family Education for ongoing care 1 15 []  - 0 Complex (extensive) Patient / Family Education for ongoing care X- 1 10 Staff obtains Chiropractor, Records, T Results / Process Orders est []  - 0 Staff telephones HHA, Nursing Homes / Clarify orders / etc []  - 0 Routine Transfer to another Facility (non-emergent condition) []  - 0 Routine Hospital Admission (non-emergent condition) []  - 0 New Admissions / Manufacturing engineer / Ordering NPWT Apligraf, etc. , []  - 0 Emergency Hospital Admission (emergent condition) X- 1 10 Simple Discharge Coordination []  - 0 Complex (extensive) Discharge Coordination PROCESS - Special Needs []  - 0 Pediatric / Minor Patient Management []  - 0 Isolation Patient Management []  - 0 Hearing / Language / Visual special needs []  - 0 Assessment of Community assistance (transportation, D/C planning, etc.) []  - 0 Additional assistance / Altered mentation []  - 0 Support Surface(s) Assessment (bed,  cushion, seat, etc.) INTERVENTIONS - Wound Cleansing / Measurement X - Simple Wound Cleansing - one wound 1 5 []  - 0 Complex Wound Cleansing - multiple wounds X- 1 5 Wound Imaging (  photographs - any number of wounds) []  - 0 Wound Tracing (instead of photographs) X- 1 5 Simple Wound Measurement - one wound []  - 0 Complex Wound Measurement - multiple wounds INTERVENTIONS - Wound Dressings X - Small Wound Dressing one or multiple wounds 1 10 []  - 0 Medium Wound Dressing one or multiple wounds []  - 0 Large Wound Dressing one or multiple wounds X- 1 5 Application of Medications - topical []  - 0 Application of Medications - injection INTERVENTIONS - Miscellaneous []  - 0 External ear exam []  - 0 Specimen Collection (cultures, biopsies, blood, body fluids, etc.) []  - 0 Specimen(s) / Culture(s) sent or taken to Lab for analysis [name], [name] ([id number]) [id number] Page 3 of 10 []  - 0 Patient Transfer (multiple staff / Nurse, adult / Similar devices) []  - 0 Simple Staple / Suture removal (25 or less) []  - 0 Complex Staple / Suture removal (26 or more) []  - 0 Hypo / Hyperglycemic Management (close monitor of Blood Glucose) []  - 0 Ankle / Brachial Index (ABI) - do not check if billed separately X- 1 5 Vital Signs Has the patient been seen at the hospital within the last three years: Yes Total Score: 95 Level Of Care: New/Established - Level 3 Electronic Signature(s) Signed: 04/27/2023 4:21:31 PM By: [name] Entered By: [name] on 04/27/2023 12:53:10 -------------------------------------------------------------------------------- Encounter Discharge Information Details Patient Name: Date of Service: Matthew Burly [name] 04/27/2023 9:45 A M Medical Record Number: [id number] Patient Account Number: [id number] Date of Birth/Sex: Treating RN: 1955/12/26 (67 y.o. [name] Primary Care [name]: [name] Other  Clinician: Referring [name]: Treating [name]/Extender: [name] in Treatment: 40 Encounter Discharge Information Items Discharge Condition: Stable Ambulatory Status: Cane Discharge Destination: Home Transportation: Private Auto Accompanied By: self Schedule Follow-up Appointment: Yes Clinical Summary of Care: Electronic Signature(s) Signed: 04/27/2023 3:54:16 PM By: [name] Entered By: [name] on 04/27/2023 12:54:16 -------------------------------------------------------------------------------- Lower Extremity Assessment Details Patient Name: Date of Service: [name] 04/27/2023 9:45 A M Medical Record Number: [id number] Patient Account Number: [id number] Date of Birth/Sex: Treating RN: 1955/07/22 (67 y.o. [name] Primary Care Sula Fetterly: [name] Other Clinician: AMILIANO, [name] ([id number]) [id number] Page 4 of 10 Referring Kimmy Totten: Treating Kensey Luepke/Extender: [name] in Treatment: 40 Edema Assessment Assessed: [Left: No] [Right: No] Edema: [Left: Yes] [Right: Yes] Calf Left: Right: Point of Measurement: 36 cm From Medial Instep 57.5 cm 61 cm Ankle Left: Right: Point of Measurement: 12 cm From Medial Instep 31.5 cm 32.5 cm Vascular Assessment Pulses: Dorsalis Pedis Palpable: [Left:Yes] [Right:Yes] Extremity colors, hair growth, and conditions: Extremity Color: [Left:Hyperpigmented] [Right:Hyperpigmented] Hair Growth on Extremity: [Left:No] [Right:No] Temperature of Extremity: [Left:Warm < 3 seconds] [Right:Warm < 3 seconds] Toe Nail Assessment Left: Right: Thick: Yes Yes Discolored: Yes Yes Deformed: No No Improper Length and Hygiene: No No Electronic Signature(s) Signed: 04/27/2023 10:24:27 AM By: [name] Entered By: [name] on 04/27/2023  07:24:27 -------------------------------------------------------------------------------- Multi Wound Chart Details Patient Name: Date of Service: Matthew Burly [name] 04/27/2023 9:45 A M Medical Record Number: [id number] Patient Account Number: [id number] Date of Birth/Sex: Treating RN: [birth year] (67 y.o. [name] Primary Care Gwenlyn Hottinger: [name] Other Clinician: Referring Ellery Tash: Treating Kylieann Eagles/Extender: [name] in Treatment: 40 Vital Signs Height(in): 73 Pulse(bpm): 72 Weight(lbs): 358 Blood Pressure(mmHg): 114/67 Body Mass Index(BMI): 47.2 Temperature(F): 97.9 Respiratory Rate(breaths/min): 18 [3:Photos:] [N/A:N/A] Right Lower Leg N/A N/A Wound Location: Gradually  Appeared N/A N/A Wounding Event: Lymphedema N/A N/A Primary Etiology: Arrhythmia, Congestive Heart Failure, N/A N/A Comorbid History: Hypertension, Type II Diabetes 02/04/2023 N/A N/A Date Acquired: 75 N/A N/A Weeks of Treatment: Open N/A N/A Wound Status: No N/A N/A Wound Recurrence: 0.4x0.4x0.1 N/A N/A Measurements L x W x D (cm) 0.126 N/A N/A A (cm) : rea 0.013 N/A N/A Volume (cm) : 89.70% N/A N/A % Reduction in A rea: 94.70% N/A N/A % Reduction in Volume: Partial Thickness N/A N/A Classification: Medium N/A N/A Exudate A mount: Serosanguineous N/A N/A Exudate Type: red, brown N/A N/A Exudate Color: Large (67-100%) N/A N/A Granulation A mount: Red N/A N/A Granulation Quality: Small (1-33%) N/A N/A Necrotic A mount: Fat Layer (Subcutaneous Tissue): Yes N/A N/A Exposed Structures: Fascia: No Tendon: No Muscle: No Joint: No Bone: No Small (1-33%) N/A N/A Epithelialization: excoriation surrounding N/A N/A Assessment Notes: Treatment Notes Electronic Signature(s) Signed: 04/27/2023 3:52:06 PM By: [name] Previous Signature: 04/27/2023 3:34:50 PM Version By: [name] Previous Signature: 04/27/2023 10:25:56 AM Version By:  [name] Entered By: [name] on 04/27/2023 12:52:06 -------------------------------------------------------------------------------- Multi-Disciplinary Care Plan Details Patient Name: Date of Service: Matthew Burly [name] 04/27/2023 9:45 A M Medical Record Number: [id number] Patient Account Number: [id number] Date of Birth/Sex: Treating RN: 1955/10/02 (67 y.o. [name] Primary Care Travontae Freiberger: [name] Other Clinician: Referring Raynold Blankenbaker: Treating Alexius Hangartner/Extender: [name] in Treatment: 40 Active Inactive Venous Leg Ulcer Nursing Diagnoses: Actual venous Insuffiency (use after diagnosis is confirmed) Goals: Patient will maintain optimal edema control [name], [name] ([id number]) [id number].pdf Page 6 of 10 Date Initiated: 10/15/2022 Target Resolution Date: 06/12/2023 Goal Status: Active Patient/caregiver will verbalize understanding of disease process and disease management Date Initiated: 10/15/2022 Date Inactivated: 11/12/2022 Target Resolution Date: 10/15/2022 Goal Status: Met Verify adequate tissue perfusion prior to therapeutic compression application Date Initiated: 10/15/2022 Date Inactivated: 11/12/2022 Target Resolution Date: 10/15/2022 Goal Status: Met Interventions: Assess peripheral edema status every visit. Compression as ordered Provide education on venous insufficiency Treatment Activities: Therapeutic compression applied : 10/15/2022 Notes: Electronic Signature(s) Signed: 04/27/2023 3:53:31 PM By: [name] Entered By: [name] on 04/27/2023 12:53:30 -------------------------------------------------------------------------------- Non-Wound Condition Assessment Details Patient Name: Date of Service: [name] 04/27/2023 9:45 A M Medical Record Number: [id number] Patient Account Number: [id number] Date of Birth/Sex: Treating RN: 1956/05/23 (68 y.o. [name] Primary Care Marvella Jenning: [name] Other Clinician: Referring Leeloo Silverthorne: Treating Primo Innis/Extender: [name] in Treatment: 40 Non-Wound Condition: Condition: Lymphedema Location: Leg Side: Left Photos Notes some open areas noted, measuring 3x4x0.1 Electronic Signature(s) Signed: 04/27/2023 4:21:31 PM By: [name] Entered By: [name] on 04/27/2023 07:15:33 [name] ([id number]) [id number] Page 7 of 10 -------------------------------------------------------------------------------- Pain Assessment Details Patient Name: Date of Service: [name] 04/27/2023 9:45 A M Medical Record Number: [id number] Patient Account Number: [id number] Date of Birth/Sex: Treating RN: 1955-07-22 (67 y.o. [name] Primary Care Ashrita Chrismer: [name] Other Clinician: Referring Brendaliz Kuk: Treating Amberia Bayless/Extender: [name] in Treatment: 40 Active Problems Location of Pain Severity and Description of Pain Patient Has Paino Yes Site Locations Rate the pain. Current Pain Level: 4 Pain Management and Medication Current Pain Management: Electronic Signature(s) Signed: 04/27/2023 10:20:03 AM By: [name] Entered By: [name] on 04/27/2023 07:20:03 -------------------------------------------------------------------------------- Patient/Caregiver Education Details Patient Name: Date of Service: [name] 11/12/2024andnbsp9:45 A M Medical Record Number: [id number] Patient Account Number: [id number] Date of Birth/Gender:  Treating RN: 1955/11/03 (67 y.o. [name] Primary Care Physician: [name] Other Clinician: Referring Physician: Treating Physician/Extender: [name] in Treatment: [address] ([id number]) [id number] Page 8 of 10 Education Assessment Education Provided  To: Patient Education Topics Provided Wound/Skin Impairment: Handouts: Caring for Your Ulcer Methods: Explain/Verbal Responses: State content correctly Electronic Signature(s) Signed: 04/27/2023 4:21:31 PM By: [name] Entered By: [name] on 04/27/2023 12:53:40 -------------------------------------------------------------------------------- Wound Assessment Details Patient Name: Date of Service: Matthew Burly [name] 04/27/2023 9:45 A M Medical Record Number: [id number] Patient Account Number: [id number] Date of Birth/Sex: Treating RN: [birth year] (67 y.o. [name] Primary Care Aubriella Perezgarcia: [name] Other Clinician: Referring Ngoc Daughtridge: Treating Kiyoto Slomski/Extender: [name] in Treatment: 40 Wound Status Wound Number: 3 Primary Lymphedema Etiology: Wound Location: Right Lower Leg Wound Status: Open Wounding Event: Gradually Appeared Comorbid Arrhythmia, Congestive Heart Failure, Hypertension, Type II Date Acquired: 02/04/2023 History: Diabetes Weeks Of Treatment: 11 Clustered Wound: No Photos Wound Measurements Length: (cm) 0.4 Width: (cm) 0.4 Depth: (cm) 0.1 Area: (cm) 0.126 Volume: (cm) 0.013 % Reduction in Area: 89.7% % Reduction in Volume: 94.7% Epithelialization: Small (1-33%) Tunneling: No Undermining: No Wound Description Classification: Partial Thickness Exudate Amount: Medium Exudate Type: Serosanguineous [name], [name] ([id number]) Exudate Color: red, brown Foul Odor After Cleansing: No Slough/Fibrino Yes [phone].pdf Page 9 of 10 Wound Bed Granulation Amount: Large (67-100%) Exposed Structure Granulation Quality: Red Fascia Exposed: No Necrotic Amount: Small (1-33%) Fat Layer (Subcutaneous Tissue) Exposed: Yes Necrotic Quality: Adherent Slough Tendon Exposed: No Muscle Exposed: No Joint Exposed: No Bone Exposed: No Assessment Notes excoriation surrounding Treatment  Notes Wound #3 (Lower Leg) Wound Laterality: Right Cleanser Soap and Water Discharge Instruction: Gently cleanse wound with antibacterial soap, rinse and pat dry prior to dressing wounds Peri-Wound Care Topical Primary Dressing Silvercel Small 2x2 (in/in) Discharge Instruction: Apply Silvercel Small 2x2 (in/in) as instructed Secondary Dressing Coverlet Latex-Free Fabric Adhesive Dressings Discharge Instruction: 1.5 x 2 Secured With Compression Wrap Compression Stockings Add-Ons Electronic Signature(s) Signed: 04/27/2023 4:21:31 PM By: [name] Entered By: [name] on 04/27/2023 07:42:26 -------------------------------------------------------------------------------- Vitals Details Patient Name: Date of Service: [name], Matthew [name] 04/27/2023 9:45 A M Medical Record Number: [id number] Patient Account Number: [id number] Date of Birth/Sex: Treating RN: 05/24/1956 (67 y.o. [name] Primary Care Stein Windhorst: [name] Other Clinician: Referring Anastacio Bua: Treating Paeton Latouche/Extender: [name] in Treatment: 40 Vital Signs Time Taken: 10:05 Temperature (F): 97.9 Height (in): 73 Pulse (bpm): 72 Weight (lbs): 358 Respiratory Rate (breaths/min): 18 Body Mass Index (BMI): 47.2 Blood Pressure (mmHg): 114/67 Reference Range: 80 - 120 mg / dl [name], [name] ([id number]) [id number] Page 10 of 10 Electronic Signature(s) Signed: 04/27/2023 10:19:53 AM By: [name] Entered By: [name] on 04/27/2023 07:19:52

## 2023-05-04 ENCOUNTER — Encounter: Payer: PPO | Admitting: Physician Assistant

## 2023-05-04 DIAGNOSIS — L97812 Non-pressure chronic ulcer of other part of right lower leg with fat layer exposed: Secondary | ICD-10-CM | POA: Diagnosis not present

## 2023-05-04 DIAGNOSIS — I890 Lymphedema, not elsewhere classified: Secondary | ICD-10-CM | POA: Diagnosis not present

## 2023-05-04 DIAGNOSIS — E11622 Type 2 diabetes mellitus with other skin ulcer: Secondary | ICD-10-CM | POA: Diagnosis not present

## 2023-05-04 NOTE — Progress Notes (Addendum)
SHEMUEL, MCCULLY ([id number]) [id number] Page 1 of 11 Visit Report for 05/04/2023 Chief Complaint Document Details Patient Name: Date of Service: [name] 05/04/2023 1:00 PM Medical Record Number: [id number] Patient Account Number: [id number] Date of Birth/Sex: Treating RN: 1955/11/10 (67 y.o. [name] Primary Care Provider: [name] Other Clinician: Referring Provider: Treating Provider/Extender: Matthew Folks in Treatment: 12 Information Obtained from: Patient Chief Complaint Bilateral LE ulcers and lymphedema Electronic Signature(s) Signed: 05/04/2023 1:05:48 PM By: Allen Derry PA-C Entered By: Allen Derry on 05/04/2023 10:05:48 -------------------------------------------------------------------------------- Debridement Details Patient Name: Date of Service: [name] [name] 05/04/2023 1:00 PM Medical Record Number: [id number] Patient Account Number: [id number] Date of Birth/Sex: Treating RN: 05/24/1956 (67 y.o. [name] Primary Care Provider: [name] Other Clinician: Referring Provider: Treating Provider/Extender: Matthew Folks in Treatment: 57 Debridement Performed for Assessment: Wound #3 Right Lower Leg Performed By: Physician Allen Derry, PA-C The following information was scribed by: Angelina Pih The information was scribed for: Allen Derry Debridement Type: Debridement Level of Consciousness (Pre-procedure): Awake and Alert Pre-procedure Verification/Time Out Yes - 13:25 Taken: Pain Control: Lidocaine 4% T opical Solution Percent of Wound Bed Debrided: 100% T Area Debrided (cm): otal 0.2 Tissue and other material debrided: Viable, Non-Viable, Slough, Subcutaneous, Slough Level: Skin/Subcutaneous Tissue Debridement Description: Excisional Instrument: Curette Bleeding: Moderate Hemostasis Achieved: Pressure Response to Treatment: Procedure was  tolerated well Level of Consciousness Arlie SolomonsRAYMIR, BENDIK ([id number]) [phone].pdf Page 2 of 11 Level of Consciousness (Post- Awake and Alert procedure): Post Debridement Measurements of Total Wound Length: (cm) 0.5 Width: (cm) 0.5 Depth: (cm) 0.3 Volume: (cm) 0.059 Character of Wound/Ulcer Post Debridement: Stable Post Procedure Diagnosis Same as Pre-procedure Electronic Signature(s) Signed: 05/04/2023 3:56:38 PM By: Angelina Pih Signed: 05/04/2023 4:00:09 PM By: Allen Derry PA-C Entered By: Angelina Pih on 05/04/2023 10:26:22 -------------------------------------------------------------------------------- HPI Details Patient Name: Date of Service: [name], Matthew [name] 05/04/2023 1:00 PM Medical Record Number: [id number] Patient Account Number: [id number] Date of Birth/Sex: Treating RN: [birth year] (67 y.o. [name] Primary Care Provider: [name] Other Clinician: Referring Provider: Treating Provider/Extender: Matthew Folks in Treatment: 64 History of Present Illness HPI Description: 07-16-2022 upon evaluation today patient appears to be doing poorly currently in regard to his his legs. The left leg is actually worse than the right. This is a patient that is previously been seen in the [place] office and at that point was doing really quite well with compression wrapping and often alginate are either Hydrofera Blue dressings. Fortunately he has gone since November 2020 until just current with out having any issue or need for wound care services which is great news. Unfortunately he is here today because he is having some issues at this point. Patient does have a history of several medical conditions which include diabetes mellitus type 2, lymphedema, hypertension, congestive heart failure, and cardiac arrhythmia though is not sure that this is actually atrial fibrillation based on what he has been told. He does  have lymphedema pumps but tells me he has not used them since the summer when he had to move to a remodel of his building and subsequently never unpacked them. 07-23-2022 upon evaluation today patient appears to be doing well with regard to his legs. He is going require some sharp debridement today but overall seems to be making some progress. I am pleased in that regard he has much less drainage that he had did last  week I think the compression wraps are definitely helping. 07-30-2022 upon evaluation today patient appears to be doing poorly in regard to his legs he actually has blue-green drainage which I think is consistent with Pseudomonas. I believe that we need to address this as soon as possible and I discussed that with the patient today as well. Fortunately I do not see any signs of active infection locally nor systemically at this time which is great news. No fevers, chills, nausea, vomiting, or diarrhea. 08-13-2022 upon evaluation today patient appears to be doing well currently in regard to his leg ulcers which are actually looking much better. Fortunately there does not appear to be any signs of active infection at this time which is great news. No fevers, chills, nausea, vomiting, or diarrhea. 3/7; patient with predominantly a left medial lower extremity wound as well as several smaller areas and a small area on the right lateral. His Jodie Echevaria came in today. We are using silver alginate as the primary dressing 09-03-2022 upon evaluation today patient appears to be doing well currently in regard to his wounds. He has been tolerating the dressing changes without complication. Fortunately there does not appear to be any signs of infection there is needed however for sharp debridement. 09-10-2022 upon evaluation today patient appears to be doing well currently in regard to his wound. Has been tolerating the dressing changes without complication. Fortunately there does not appear to be any signs of  infection looking systemically which is great news and overall I am extremely pleased with where we stand today. I do not see any signs of infection. 09-17-2022 upon evaluation today patient actually is making signs of improvement the good news is he is making good improvement. With that being said he still is having quite a time keeping the swelling down. We have been using Tubigrip I think that still probably our best option but nonetheless I think that we are making progress with regard to his wounds. 09-24-2022 upon evaluation today patient appears to be doing well currently in regard to his wounds is continuing to make improvements at this point which is great news and overall I am extremely pleased with where we stand today. Fortunately I do not see any evidence of active infection at this time which is great news and in general I think he is doing quite well with the [facility] and silver alginate. [name], [name] ([id number]) [id number] Page 3 of 11 10-01-2022 upon evaluation today patient appears to be doing pretty well currently in regard to his wounds. He has been tolerating the dressing changes without complication. Fortunately I do not see any evidence of active infection locally nor systemically which is great news and I do believe that 10-08-2022 upon evaluation today patient's wounds actually are showing some signs of improvement which is great news. Still this is very slow to heal I really feel like he would do better if we can get him in some stronger compression wrapping he does have the juxta lite compression wraps therefore we can actually see about doing that over top of his Tubigrip to see if that can be beneficial. He does not love the hide he had as they are not the most comfortable thing for him but nonetheless I think it would be beneficial he is in agreement with going forward with this. 10-15-2022 upon evaluation today patient appears to be doing well  currently in regard to his wounds that do not appear to be infected which is good news.  With that being said unfortunately he still continues to have significant amount of swelling we really need to do something to try to get the swelling under control. I do not see any signs of infection locally nor systemically but at the same time the amount of edema is tremendous. The juxta lite helps but again he is only taking the fluid pills once or twice a week due to the amount that makes him go to the restroom. Therefore he does not take this daily which is really what he needs on a more regular basis. We need to try to get some compression on and is a little bit stronger. 10-22-2022 upon evaluation today patient actually appears to be making some really good progress in regard to his wound. The compression wrap was put on last time actually doing a great job he looks much improved and in general I feel like that we are making great progress. I do not see any evidence of active infection locally nor systemically which is great news. 10-29-2022 upon evaluation today patient appears to be doing decently well in regard to his legs currently. I feel like that his swelling is much better I feel like that he is also doing much better in regard to the wounds in general. I do not see any signs of active infection which is great news and in general I think that he is making excellent progress towards complete resolution. The patient does seem to have 1 issue that is with his wraps actually slipping down working I definitely need to work on that aspect. 11-05-2022 upon evaluation today patient appears to be doing excellent in regard to his wound. He has been tolerating the dressing changes without complication. He actually seems to be making excellent progress I am extremely pleased with where things stand currently. I do believe that we are really making good progress and I think that we are on the right track towards  complete closure I think the compression wrap has been a dramatic shift in a good way for him as far as getting the wounds healed a lot of these areas that we will continue to leave Completely sealed up. 11-12-2022 upon evaluation today patient appears to be doing well currently in regard to his leg ulcer. The compression wrapping seems to be doing an excellent job. Fortunately I do not see any evidence of active infection locally nor systemically which is great news and in general I do believe that we are making good headway here towards complete closure. 11-19-2022 upon evaluation today patient appears to be doing well currently in regard to his wounds. He is actually showing signs of excellent improvement and very pleased with where we stand I think that he is making great progress. I do not see any evidence of infection at this time which is great news. 11-26-2022 upon evaluation today patient appears to be doing well currently in regard to his wound. He is actually been tolerating the dressing changes without complication. Fortunately I do not see any evidence of active infection locally nor systemically which is great news I think he is making excellent progress here towards closure. 12-03-2022 upon evaluation today patient appears to be doing well currently in regard to his wound. He in fact at both locations is doing great and seems to be making excellent progress and very pleased in that regard. I do not see any signs of active infection at this time. 12-10-2022 upon evaluation patient's wound is actually significantly smaller with  one of the satellite lesions closed and there is just 1 small area remaining and this is actually doing quite well. I am very pleased with her progress. 12-15-2022 upon evaluation today patient appears to be doing actually pretty well in regard to his wound on the left leg. He does have a little area on the backside that open that was previously close last week due to the  fact that his wrap got wet over the weekend and he had to make do with stuff they can find at the drugstore. They made this work out however and the good news is he actually seems to be doing significantly better and the wound looks fine except for the fact that he has a couple of areas that just reopen on the backside very tiny. Nonetheless I do believe that we can go ahead and see what we do about trying to get this moving in the right direction yet again. 12-22-2022 upon evaluation today patient appears to be doing poorly in regard to his wrap slid down and his leg is extremely swollen. It is also painful secondary to it being so swollen. Fortunately I do not see any evidence of infection right now but at the same time I do believe that if we do not get the swelling under control is not really good to alleviate his discomfort. We definitely can need to change his wrap out however in short order. 12-31-2022 upon evaluation today patient appears to be doing well currently in regard to his wound. He has been tolerating the dressing changes without complication. This actually looks to be doing much better today compared to where we were. Fortunately I do not see any evidence of active infection locally or systemically which is great news. 01-11-2023 upon evaluation today patient appears to be doing well currently in regard to his wound which is actually showing signs of significant improvement. Fortunately I do not see any signs of infection locally or systemically which is great news and in general I do believe that we are making good headway towards complete closure. 01-19-2023 upon evaluation today patient appears to be doing well currently hemoglobin to his wound. He has been tolerating the dressing changes without complication. Fortunately the wound is not infected unfortunately it is a little bigger due to the fact that the wrap slid down. I do not see any signs of worsening or infection at this  time. 01-28-2023 upon evaluation today patient appears to be doing well currently in regard to his wound. He has been tolerating the dressing changes without complication. Fortunately I do not see any signs of infection the compression wrap is doing a really good job and I think it worked pretty much just about healed. 02-04-2023 upon evaluation today patient appears to be doing excellent in regard to his wounds. In fact he is pretty much completely healed on the left although I think this may have just a pinpoint opening. Nonetheless I think it is 1 more week to toughen up. 02-16-2023 upon evaluation today patient appears to be doing well currently in regard to his wound. He has been tolerating the dressing changes without complication. Fortunately there does not appear to be any evidence of active infection locally or systemically which is good news. In fact the original wounds we been taking care of on the left and right legs are completely healed unfortunately his wrap slipped down the right leg and there are 2 small areas on the anterior portion of his leg which  are open at this point. When I wrapped his right leg the left leg is completely closed however he is in agreement to his own compression sock. 02-26-2023 upon evaluation today patient appears to be doing well currently in regard to his left leg which is still close in the right leg is doing significantly better. Fortunately there does not appear to be any signs of active infection locally or systemically which is great news and in general I do believe that we will making headway towards complete closure. 03-05-2023 upon evaluation today patient actually appears to be doing excellent in regard to his wounds one of the areas healed the other is significantly improved. I am actually very pleased with where we stand today and I think that he is doing well with his compression stockings which is excellent as well. 03-19-2023 upon evaluation today  patient appears to be doing poorly currently in regard to his wounds. He seems to be doing a bit worse as far as both legs are concerned as far as open wounds and I think that we are going need to address this rapidly to keep it from backtracking and getting significantly worse. He voiced understanding he kind of somewhat expected I believe. [name], [name] ([id number]) [id number] Page 4 of 11 03-29-2023 upon evaluation patient actually showing signs of definite improvement with regard to his wounds currently with some already drying up and on the right lateral leg this is much smaller. Will continue to wrap him this seems to be doing quite well. 04-06-2023 upon evaluation today patient's wounds on the legs actually appear to be doing better which is great news. Fortunately I do not see any evidence of worsening overall and I do believe that the patient is making good headway towards closure which is good news. I do not see any signs of infection which is also excellent. He actually only had the wraps on for a couple of days before they started slipping down he had cut them off and therefore spent the remaining 5 days pretty much just in his compression socks he seems of done very well though. 04-13-2023 upon evaluation patient is down to just 1 wound on the right leg everything appears to be doing excellent I think he is making excellent progress towards complete closure. Fortunately I do not see any signs of active infection locally or systemically which is great news. 04-20-2023 upon evaluation today patient appears to be doing well currently in regard to his wound. He has been tolerating the dressing changes without complication. Fortunately there does not appear to be any signs of active infection at this time. No fevers, chills, nausea, vomiting, or diarrhea. 04-27-2023 upon evaluation today patient appears to be doing a little worse with some irritation around the  sides of his wounds. I am not sure if he has been wearing his compression all the time but he does tell me which I Cellerate well that his dressings were not quite in place and therefore there is more drainage and irritation I think due to the fact that he had accidentally put it in the wrong location on his right leg. Nonetheless the wound does not Bigger but this just some irritation of the skin around. 05-04-2023 upon evaluation today patient's wounds are actually showing some signs of improvement here which is great news. Fortunately I do not see any signs of worsening overall and I believe that he is making good headway here towards closure which is great news. Electronic Signature(s) Signed:  05/04/2023 1:50:03 PM By: Allen Derry PA-C Entered By: Allen Derry on 05/04/2023 10:50:03 -------------------------------------------------------------------------------- Physical Exam Details Patient Name: Date of Service: [name], Matthew [name] 05/04/2023 1:00 PM Medical Record Number: [id number] Patient Account Number: [id number] Date of Birth/Sex: Treating RN: 23-Dec-1955 (67 y.o. [name] Primary Care Provider: [name] Other Clinician: Referring Provider: Treating Provider/Extender: Matthew Folks in Treatment: 58 Constitutional Obese and well-hydrated in no acute distress. Respiratory normal breathing without difficulty. Psychiatric this patient is able to make decisions and demonstrates good insight into disease process. Alert and Oriented x 3. pleasant and cooperative. Notes Upon inspection patient's wound bed actually showed signs of good granulation and epithelization at this point. With that being said that he actually does seem to be doing well with the alginate and the bandages over the area. I did not perform some debridement on the right leg the left leg he really does not have wounds as much as he just has areas of weeping at this point. Electronic  Signature(s) Signed: 05/04/2023 1:50:35 PM By: Allen Derry PA-C Entered By: Allen Derry on 05/04/2023 10:50:34 [name] ([id number]) [id number] Page 5 of 11 -------------------------------------------------------------------------------- Physician Orders Details Patient Name: Date of Service: [name] 05/04/2023 1:00 PM Medical Record Number: [id number] Patient Account Number: [id number] Date of Birth/Sex: Treating RN: 10/21/1955 (67 y.o. [name] Primary Care Provider: [name] Other Clinician: Referring Provider: Treating Provider/Extender: Matthew Folks in Treatment: 41 The following information was scribed by: Angelina Pih The information was scribed for: Allen Derry Verbal / Phone Orders: No Diagnosis Coding ICD-10 Coding Code Description E11.622 Type 2 diabetes mellitus with other skin ulcer I89.0 Lymphedema, not elsewhere classified L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 L97.812 Non-pressure chronic ulcer of other part of right lower leg with fat layer exposed I10 Essential (primary) hypertension I50.42 Chronic combined systolic (congestive) and diastolic (congestive) heart failure I49.9 Cardiac arrhythmia, unspecified Follow-up Appointments Return Appointment in 2 weeks. Nurse Visit as needed - PRN Bathing/ Shower/ Hygiene May shower with wound dressing protected with water repellent cover or cast protector. Anesthetic (Use 'Patient Medications' Section for Anesthetic Order Entry) Lidocaine applied to wound bed Edema Control - Orders / Instructions Patient to wear own compression stockings. Remove compression stockings every night before going to bed and put on every morning when getting up. Elevate, Exercise Daily and A void Standing for Long Periods of Time. Elevate leg(s) parallel to the floor when sitting. DO YOUR BEST to sleep in  the bed at night. DO NOT sleep in your recliner. Long hours of sitting in a recliner leads to swelling of the legs and/or potential wounds on your backside. Non-Wound Condition Right Lower Extremity dditional non-wound orders/instructions: - A and D and abd to right heel, small piece of silvercell to area on LLL A Wound Treatment Wound #3 - Lower Leg Wound Laterality: Right Cleanser: Soap and Water 1 x Per Day/30 Days Discharge Instructions: Gently cleanse wound with antibacterial soap, rinse and pat dry prior to dressing wounds Prim Dressing: Silvercel Small 2x2 (in/in) 1 x Per Day/30 Days ary Discharge Instructions: Apply Silvercel Small 2x2 (in/in) as instructed Secondary Dressing: Coverlet Latex-Free Fabric Adhesive Dressings 1 x Per Day/30 Days Discharge Instructions: 1.5 x 2 Electronic Signature(s) Signed: 05/04/2023 3:56:38 PM By: Angelina Pih Signed: 05/04/2023 4:00:09 PM By: Allen Derry PA-C Entered By: Angelina Pih on 05/04/2023 10:41:50 [name] ([id number])  [id number] Page 6 of 11 -------------------------------------------------------------------------------- Problem List Details Patient Name: Date of Service: [name] 05/04/2023 1:00 PM Medical Record Number: [id number] Patient Account Number: [id number] Date of Birth/Sex: Treating RN: [birth year] (67 y.o. [name] Primary Care Provider: [name] Other Clinician: Referring Provider: Treating Provider/Extender: Matthew Folks in Treatment: 18 Active Problems ICD-10 Encounter Code Description Active Date MDM Diagnosis E11.622 Type 2 diabetes mellitus with other skin ulcer 07/16/2022 No Yes I89.0 Lymphedema, not elsewhere classified 07/16/2022 No Yes L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2/06/2022 No Yes L97.812 Non-pressure chronic ulcer of other part of right lower leg with fat layer  07/16/2022 No Yes exposed I10 Essential (primary) hypertension 07/16/2022 No Yes I50.42 Chronic combined systolic (congestive) and diastolic (congestive) heart failure 07/16/2022 No Yes I49.9 Cardiac arrhythmia, unspecified 07/16/2022 No Yes Inactive Problems Resolved Problems Electronic Signature(s) Signed: 05/04/2023 1:05:42 PM By: Allen Derry PA-C Entered By: Allen Derry on 05/04/2023 10:05:42 [name] ([id number]) [id number] Page 7 of 11 -------------------------------------------------------------------------------- Progress Note Details Patient Name: Date of Service: [name] 05/04/2023 1:00 PM Medical Record Number: [id number] Patient Account Number: [id number] Date of Birth/Sex: Treating RN: 1955/12/20 (67 y.o. [name] Primary Care Provider: [name] Other Clinician: Referring Provider: Treating Provider/Extender: Matthew Folks in Treatment: 43 Subjective Chief Complaint Information obtained from Patient Bilateral LE ulcers and lymphedema History of Present Illness (HPI) 07-16-2022 upon evaluation today patient appears to be doing poorly currently in regard to his his legs. The left leg is actually worse than the right. This is a patient that is previously been seen in the [place] office and at that point was doing really quite well with compression wrapping and often alginate are either Hydrofera Blue dressings. Fortunately he has gone since November 2020 until just current with out having any issue or need for wound care services which is great news. Unfortunately he is here today because he is having some issues at this point. Patient does have a history of several medical conditions which include diabetes mellitus type 2, lymphedema, hypertension, congestive heart failure, and cardiac arrhythmia though is not sure that this is actually atrial fibrillation based on what he has been told. He does have  lymphedema pumps but tells me he has not used them since the summer when he had to move to a remodel of his building and subsequently never unpacked them. 07-23-2022 upon evaluation today patient appears to be doing well with regard to his legs. He is going require some sharp debridement today but overall seems to be making some progress. I am pleased in that regard he has much less drainage that he had did last week I think the compression wraps are definitely helping. 07-30-2022 upon evaluation today patient appears to be doing poorly in regard to his legs he actually has blue-green drainage which I think is consistent with Pseudomonas. I believe that we need to address this as soon as possible and I discussed that with the patient today as well. Fortunately I do not see any signs of active infection locally nor systemically at this time which is great news. No fevers, chills, nausea, vomiting, or diarrhea. 08-13-2022 upon evaluation today patient appears to be doing well currently in regard to his leg ulcers which are actually looking much better. Fortunately there does not appear to be any signs of active infection at this time which is great news.  No fevers, chills, nausea, vomiting, or diarrhea. 3/7; patient with predominantly a left medial lower extremity wound as well as several smaller areas and a small area on the right lateral. His Jodie Echevaria came in today. We are using silver alginate as the primary dressing 09-03-2022 upon evaluation today patient appears to be doing well currently in regard to his wounds. He has been tolerating the dressing changes without complication. Fortunately there does not appear to be any signs of infection there is needed however for sharp debridement. 09-10-2022 upon evaluation today patient appears to be doing well currently in regard to his wound. Has been tolerating the dressing changes without complication. Fortunately there does not appear to be any signs of  infection looking systemically which is great news and overall I am extremely pleased with where we stand today. I do not see any signs of infection. 09-17-2022 upon evaluation today patient actually is making signs of improvement the good news is he is making good improvement. With that being said he still is having quite a time keeping the swelling down. We have been using Tubigrip I think that still probably our best option but nonetheless I think that we are making progress with regard to his wounds. 09-24-2022 upon evaluation today patient appears to be doing well currently in regard to his wounds is continuing to make improvements at this point which is great news and overall I am extremely pleased with where we stand today. Fortunately I do not see any evidence of active infection at this time which is great news and in general I think he is doing quite well with the [facility] and silver alginate. 10-01-2022 upon evaluation today patient appears to be doing pretty well currently in regard to his wounds. He has been tolerating the dressing changes without complication. Fortunately I do not see any evidence of active infection locally nor systemically which is great news and I do believe that 10-08-2022 upon evaluation today patient's wounds actually are showing some signs of improvement which is great news. Still this is very slow to heal I really feel like he would do better if we can get him in some stronger compression wrapping he does have the juxta lite compression wraps therefore we can actually see about doing that over top of his Tubigrip to see if that can be beneficial. He does not love the hide he had as they are not the most comfortable thing for him but nonetheless I think it would be beneficial he is in agreement with going forward with this. 10-15-2022 upon evaluation today patient appears to be doing well currently in regard to his wounds that do not appear to be infected which is good  news. With that being said unfortunately he still continues to have significant amount of swelling we really need to do something to try to get the swelling under control. I do not see any signs of infection locally nor systemically but at the same time the amount of edema is tremendous. The juxta lite helps but again he is only taking the fluid pills once or twice a week due to the amount that makes him go to the restroom. Therefore he does not take this daily which is really what he needs on a more regular basis. We need to try to get some compression on and is a little bit stronger. 10-22-2022 upon evaluation today patient actually appears to be making some really good progress in regard to his wound. The compression wrap was put  on last time actually doing a great job he looks much improved and in general I feel like that we are making great progress. I do not see any evidence of active infection locally nor systemically which is great news. 10-29-2022 upon evaluation today patient appears to be doing decently well in regard to his legs currently. I feel like that his swelling is much better I feel like that he is also doing much better in regard to the wounds in general. I do not see any signs of active infection which is great news and in general I think that he [name], [name] ([id number]) [phone] Page 8 of 11 is making excellent progress towards complete resolution. The patient does seem to have 1 issue that is with his wraps actually slipping down working I definitely need to work on that aspect. 11-05-2022 upon evaluation today patient appears to be doing excellent in regard to his wound. He has been tolerating the dressing changes without complication. He actually seems to be making excellent progress I am extremely pleased with where things stand currently. I do believe that we are really making good progress and I think that we are on the right track towards  complete closure I think the compression wrap has been a dramatic shift in a good way for him as far as getting the wounds healed a lot of these areas that we will continue to leave Completely sealed up. 11-12-2022 upon evaluation today patient appears to be doing well currently in regard to his leg ulcer. The compression wrapping seems to be doing an excellent job. Fortunately I do not see any evidence of active infection locally nor systemically which is great news and in general I do believe that we are making good headway here towards complete closure. 11-19-2022 upon evaluation today patient appears to be doing well currently in regard to his wounds. He is actually showing signs of excellent improvement and very pleased with where we stand I think that he is making great progress. I do not see any evidence of infection at this time which is great news. 11-26-2022 upon evaluation today patient appears to be doing well currently in regard to his wound. He is actually been tolerating the dressing changes without complication. Fortunately I do not see any evidence of active infection locally nor systemically which is great news I think he is making excellent progress here towards closure. 12-03-2022 upon evaluation today patient appears to be doing well currently in regard to his wound. He in fact at both locations is doing great and seems to be making excellent progress and very pleased in that regard. I do not see any signs of active infection at this time. 12-10-2022 upon evaluation patient's wound is actually significantly smaller with one of the satellite lesions closed and there is just 1 small area remaining and this is actually doing quite well. I am very pleased with her progress. 12-15-2022 upon evaluation today patient appears to be doing actually pretty well in regard to his wound on the left leg. He does have a little area on the backside that open that was previously close last week due to the  fact that his wrap got wet over the weekend and he had to make do with stuff they can find at the drugstore. They made this work out however and the good news is he actually seems to be doing significantly better and the wound looks fine except for the fact that he has a couple of  areas that just reopen on the backside very tiny. Nonetheless I do believe that we can go ahead and see what we do about trying to get this moving in the right direction yet again. 12-22-2022 upon evaluation today patient appears to be doing poorly in regard to his wrap slid down and his leg is extremely swollen. It is also painful secondary to it being so swollen. Fortunately I do not see any evidence of infection right now but at the same time I do believe that if we do not get the swelling under control is not really good to alleviate his discomfort. We definitely can need to change his wrap out however in short order. 12-31-2022 upon evaluation today patient appears to be doing well currently in regard to his wound. He has been tolerating the dressing changes without complication. This actually looks to be doing much better today compared to where we were. Fortunately I do not see any evidence of active infection locally or systemically which is great news. 01-11-2023 upon evaluation today patient appears to be doing well currently in regard to his wound which is actually showing signs of significant improvement. Fortunately I do not see any signs of infection locally or systemically which is great news and in general I do believe that we are making good headway towards complete closure. 01-19-2023 upon evaluation today patient appears to be doing well currently hemoglobin to his wound. He has been tolerating the dressing changes without complication. Fortunately the wound is not infected unfortunately it is a little bigger due to the fact that the wrap slid down. I do not see any signs of worsening or infection at this  time. 01-28-2023 upon evaluation today patient appears to be doing well currently in regard to his wound. He has been tolerating the dressing changes without complication. Fortunately I do not see any signs of infection the compression wrap is doing a really good job and I think it worked pretty much just about healed. 02-04-2023 upon evaluation today patient appears to be doing excellent in regard to his wounds. In fact he is pretty much completely healed on the left although I think this may have just a pinpoint opening. Nonetheless I think it is 1 more week to toughen up. 02-16-2023 upon evaluation today patient appears to be doing well currently in regard to his wound. He has been tolerating the dressing changes without complication. Fortunately there does not appear to be any evidence of active infection locally or systemically which is good news. In fact the original wounds we been taking care of on the left and right legs are completely healed unfortunately his wrap slipped down the right leg and there are 2 small areas on the anterior portion of his leg which are open at this point. When I wrapped his right leg the left leg is completely closed however he is in agreement to his own compression sock. 02-26-2023 upon evaluation today patient appears to be doing well currently in regard to his left leg which is still close in the right leg is doing significantly better. Fortunately there does not appear to be any signs of active infection locally or systemically which is great news and in general I do believe that we will making headway towards complete closure. 03-05-2023 upon evaluation today patient actually appears to be doing excellent in regard to his wounds one of the areas healed the other is significantly improved. I am actually very pleased with where we stand today and I  think that he is doing well with his compression stockings which is excellent as well. 03-19-2023 upon evaluation today  patient appears to be doing poorly currently in regard to his wounds. He seems to be doing a bit worse as far as both legs are concerned as far as open wounds and I think that we are going need to address this rapidly to keep it from backtracking and getting significantly worse. He voiced understanding he kind of somewhat expected I believe. 03-29-2023 upon evaluation patient actually showing signs of definite improvement with regard to his wounds currently with some already drying up and on the right lateral leg this is much smaller. Will continue to wrap him this seems to be doing quite well. 04-06-2023 upon evaluation today patient's wounds on the legs actually appear to be doing better which is great news. Fortunately I do not see any evidence of worsening overall and I do believe that the patient is making good headway towards closure which is good news. I do not see any signs of infection which is also excellent. He actually only had the wraps on for a couple of days before they started slipping down he had cut them off and therefore spent the remaining 5 days pretty much just in his compression socks he seems of done very well though. 04-13-2023 upon evaluation patient is down to just 1 wound on the right leg everything appears to be doing excellent I think he is making excellent progress towards complete closure. Fortunately I do not see any signs of active infection locally or systemically which is great news. 04-20-2023 upon evaluation today patient appears to be doing well currently in regard to his wound. He has been tolerating the dressing changes without complication. Fortunately there does not appear to be any signs of active infection at this time. No fevers, chills, nausea, vomiting, or diarrhea. 04-27-2023 upon evaluation today patient appears to be doing a little worse with some irritation around the sides of his wounds. I am not sure if he has been wearing his compression all the  time but he does tell me which I Cellerate well that his dressings were not quite in place and therefore there is more drainage and irritation I think due to the fact that he had accidentally put it in the wrong location on his right leg. Nonetheless the wound does not Bigger but this just some irritation of the skin around. 05-04-2023 upon evaluation today patient's wounds are actually showing some signs of improvement here which is great news. Fortunately I do not see any signs of worsening overall and I believe that he is making good headway here towards closure which is great news. [name], [name] ([id number]) [id number] Page 9 of 11 Objective Constitutional Obese and well-hydrated in no acute distress. Vitals Time Taken: 1:02 PM, Height: 73 in, Weight: 358 lbs, BMI: 47.2, Temperature: 97.9 F, Pulse: 89 bpm, Respiratory Rate: 18 breaths/min, Blood Pressure: 115/71 mmHg. Respiratory normal breathing without difficulty. Psychiatric this patient is able to make decisions and demonstrates good insight into disease process. Alert and Oriented x 3. pleasant and cooperative. General Notes: Upon inspection patient's wound bed actually showed signs of good granulation and epithelization at this point. With that being said that he actually does seem to be doing well with the alginate and the bandages over the area. I did not perform some debridement on the right leg the left leg he really does not have wounds as much as he just has areas  of weeping at this point. Integumentary (Hair, Skin) Wound #3 status is Open. Original cause of wound was Gradually Appeared. The date acquired was: 02/04/2023. The wound has been in treatment 12 weeks. The wound is located on the Right Lower Leg. The wound measures 0.5cm length x 0.5cm width x 0.1cm depth; 0.196cm^2 area and 0.02cm^3 volume. There is Fat Layer (Subcutaneous Tissue) exposed. There is no tunneling or undermining noted. There  is a medium amount of serosanguineous drainage noted. There is large (67-100%) red granulation within the wound bed. There is a small (1-33%) amount of necrotic tissue within the wound bed including Adherent Slough. Other Condition(s) Patient presents with Lymphedema located on the Left Leg. General Notes: scattered open areas noted Assessment Active Problems ICD-10 Type 2 diabetes mellitus with other skin ulcer Lymphedema, not elsewhere classified Non-pressure chronic ulcer of other part of left lower leg with fat layer exposed Non-pressure chronic ulcer of other part of right lower leg with fat layer exposed Essential (primary) hypertension Chronic combined systolic (congestive) and diastolic (congestive) heart failure Cardiac arrhythmia, unspecified Procedures Wound #3 Pre-procedure diagnosis of Wound #3 is a Lymphedema located on the Right Lower Leg . There was a Excisional Skin/Subcutaneous Tissue Debridement with a total area of 0.2 sq cm performed by Allen Derry, PA-C. With the following instrument(s): Curette to remove Viable and Non-Viable tissue/material. Material removed includes Subcutaneous Tissue and Slough and after achieving pain control using Lidocaine 4% T opical Solution. No specimens were taken. A time out was conducted at 13:25, prior to the start of the procedure. A Moderate amount of bleeding was controlled with Pressure. The procedure was tolerated well. Post Debridement Measurements: 0.5cm length x 0.5cm width x 0.3cm depth; 0.059cm^3 volume. Character of Wound/Ulcer Post Debridement is stable. Post procedure Diagnosis Wound #3: Same as Pre-Procedure Plan Follow-up Appointments: Return Appointment in 2 weeks. Nurse Visit as needed - PRN Bathing/ Shower/ Hygiene: May shower with wound dressing protected with water repellent cover or cast protector. Anesthetic (Use 'Patient Medications' Section for Anesthetic Order Entry): Lidocaine applied to wound bed Edema  Control - Orders / Instructions: Patient to wear own compression stockings. Remove compression stockings every night before going to bed and put on every morning when getting up. [name], [name] ([id number]) [id number] Page 10 of 11 Elevate, Exercise Daily and Avoid Standing for Long Periods of Time. Elevate leg(s) parallel to the floor when sitting. DO YOUR BEST to sleep in the bed at night. DO NOT sleep in your recliner. Long hours of sitting in a recliner leads to swelling of the legs and/or potential wounds on your backside. Non-Wound Condition: Additional non-wound orders/instructions: - A and D and abd to right heel, small piece of silvercell to area on LLL WOUND #3: - Lower Leg Wound Laterality: Right Cleanser: Soap and Water 1 x Per Day/30 Days Discharge Instructions: Gently cleanse wound with antibacterial soap, rinse and pat dry prior to dressing wounds Prim Dressing: Silvercel Small 2x2 (in/in) 1 x Per Day/30 Days ary Discharge Instructions: Apply Silvercel Small 2x2 (in/in) as instructed Secondary Dressing: Coverlet Latex-Free Fabric Adhesive Dressings 1 x Per Day/30 Days Discharge Instructions: 1.5 x 2 1. I would recommend based on what we are seeing that we have the patient going continue to monitor for any signs of infection or worsening. I do believe that he is tolerating the dressing changes without complication. Specifically with regard to the silver alginate dressing I think this has been something that is beneficial up to this point.  2. I am going to recommend currently that the patient continue with the band large Band-Aids to secure dressings in place. 3. He is given continue his own compression socks which I think is doing well with. My hope is he will be able to get these areas healed proving that he can keep things close and then we will proceed with just having him use his compression stockings ongoing. He is in agreement with plan. We  will see patient back for reevaluation in 1 week here in the clinic. If anything worsens or changes patient will contact our office for additional recommendations. Electronic Signature(s) Signed: 05/04/2023 1:51:41 PM By: Allen Derry PA-C Entered By: Allen Derry on 05/04/2023 10:51:41 -------------------------------------------------------------------------------- SuperBill Details Patient Name: Date of Service: [name], Matthew [name] 05/04/2023 Medical Record Number: [id number] Patient Account Number: [id number] Date of Birth/Sex: Treating RN: 1955-08-28 (67 y.o. [name] Primary Care Provider: [name] Other Clinician: Referring Provider: Treating Provider/Extender: Matthew Folks in Treatment: 41 Diagnosis Coding ICD-10 Codes Code Description E11.622 Type 2 diabetes mellitus with other skin ulcer I89.0 Lymphedema, not elsewhere classified L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 L97.812 Non-pressure chronic ulcer of other part of right lower leg with fat layer exposed I10 Essential (primary) hypertension I50.42 Chronic combined systolic (congestive) and diastolic (congestive) heart failure I49.9 Cardiac arrhythmia, unspecified Facility Procedures : CPT4 Code: [id number] Description: 11042 - DEB SUBQ TISSUE 20 SQ CM/< ICD-10 Diagnosis Description L97.822 Non-pressure chronic ulcer of other part of left lower leg with fat layer expos Modifier: ed Quantity: 1 Physician Procedures : CPT4 Code Description Modifier [name], [name] ([id number]) [id number] Page [id number] 11042 - WC PHYS SUBQ TISS 20 SQ CM 1 ICD-10 Diagnosis Description L97.822 Non-pressure chronic ulcer of other part of left lower leg with fat  layer exposed Quantity: 11 of 11 Electronic Signature(s) Signed: 05/04/2023 1:52:48 PM By: Allen Derry PA-C Entered By: Allen Derry on 05/04/2023 10:52:48

## 2023-05-04 NOTE — Progress Notes (Signed)
Matthew, [name] ([id number]) [id number] Page 1 of 9 Visit Report for 05/04/2023 Arrival Information Details Patient Name: Date of Service: [name] 05/04/2023 1:00 PM Medical Record Number: [id number] Patient Account Number: [id number] Date of Birth/Sex: Treating RN: [birth year] (67 y.o. [name] Primary Care Chriss Mannan: [name] Other Clinician: Referring Inessa Wardrop: Treating Eddie Payette/Extender: Matthew Folks in Treatment: 21 Visit Information History Since Last Visit Added or deleted any medications: No Patient Arrived: [name] Any new allergies or adverse reactions: No Arrival Time: 12:59 Had a fall or experienced change in No Accompanied By: self activities of daily living that may affect Transfer Assistance: None risk of falls: Patient Identification Verified: Yes Hospitalized since last visit: No Secondary Verification Process Completed: Yes Has Dressing in Place as Prescribed: Yes Patient Requires Transmission-Based Precautions: No Has Compression in Place as Prescribed: Yes Patient Has Alerts: No Pain Present Now: Yes Electronic Signature(s) Signed: 05/04/2023 3:56:38 PM By: Angelina Pih Entered By: Angelina Pih on 05/04/2023 10:02:24 -------------------------------------------------------------------------------- Clinic Level of Care Assessment Details Patient Name: Date of Service: [name] 05/04/2023 1:00 PM Medical Record Number: [id number] Patient Account Number: [id number] Date of Birth/Sex: Treating RN: 03-30-1956 (67 y.o. [name] Primary Care Chameka Mcmullen: [name] Other Clinician: Referring Jurell Basista: Treating Aneth Schlagel/Extender: Matthew Folks in Treatment: 41 Clinic Level of Care Assessment Items TOOL 1 Quantity Score []  - 0 Use when EandM and Procedure is performed on INITIAL visit ASSESSMENTS - Nursing Assessment / Reassessment []  -  0 General Physical Exam (combine w/ comprehensive assessment (listed just below) when performed on new pt. evals) []  - 0 Comprehensive Assessment (HX, ROS, Risk Assessments, Wounds Hx, etc.) ASSESSMENTS - Wound and Skin Assessment / Reassessment []  - 0 Dermatologic / Skin Assessment (not related to wound area) Matthew, [name] ([id number]) [id number] Page 2 of 9 ASSESSMENTS - Ostomy and/or Continence Assessment and Care []  - 0 Incontinence Assessment and Management []  - 0 Ostomy Care Assessment and Management (repouching, etc.) PROCESS - Coordination of Care []  - 0 Simple Patient / Family Education for ongoing care []  - 0 Complex (extensive) Patient / Family Education for ongoing care []  - 0 Staff obtains Chiropractor, Records, T Results / Process Orders est []  - 0 Staff telephones HHA, Nursing Homes / Clarify orders / etc []  - 0 Routine Transfer to another Facility (non-emergent condition) []  - 0 Routine Hospital Admission (non-emergent condition) []  - 0 New Admissions / Manufacturing engineer / Ordering NPWT Apligraf, etc. , []  - 0 Emergency Hospital Admission (emergent condition) PROCESS - Special Needs []  - 0 Pediatric / Minor Patient Management []  - 0 Isolation Patient Management []  - 0 Hearing / Language / Visual special needs []  - 0 Assessment of Community assistance (transportation, D/C planning, etc.) []  - 0 Additional assistance / Altered mentation []  - 0 Support Surface(s) Assessment (bed, cushion, seat, etc.) INTERVENTIONS - Miscellaneous []  - 0 External ear exam []  - 0 Patient Transfer (multiple staff / Nurse, adult / Similar devices) []  - 0 Simple Staple / Suture removal (25 or less) []  - 0 Complex Staple / Suture removal (26 or more) []  - 0 Hypo/Hyperglycemic Management (do not check if billed separately) []  - 0 Ankle / Brachial Index (ABI) - do not check if billed separately Has the patient been seen at the hospital  within the last three years: Yes Total Score: 0 Level Of Care: ____ Electronic Signature(s) Signed: 05/04/2023 3:56:38 PM By: Angelina Pih Entered By:  Angelina Pih on 05/04/2023 10:39:19 -------------------------------------------------------------------------------- Encounter Discharge Information Details Patient Name: Date of Service: [name] 05/04/2023 1:00 PM Medical Record Number: [id number] Patient Account Number: [id number] Date of Birth/Sex: Treating RN: [birth year] (67 y.o. [name] Primary Care Anthon Harpole: [name] Other Clinician: Referring Karyss Frese: Treating Sir Mallis/Extender: Matthew Folks in Treatment: 62 Encounter Discharge Information Items Post Procedure [name], [name] ([id number]) [phone].pdf Page 3 of 9 Discharge Condition: Stable Temperature (F): 97.9 Ambulatory Status: Cane Pulse (bpm): 89 Discharge Destination: Home Respiratory Rate (breaths/min): 18 Transportation: Private Auto Blood Pressure (mmHg): 115/71 Accompanied By: self Schedule Follow-up Appointment: Yes Clinical Summary of Care: Electronic Signature(s) Signed: 05/04/2023 1:40:28 PM By: Angelina Pih Entered By: Angelina Pih on 05/04/2023 10:40:27 -------------------------------------------------------------------------------- Lower Extremity Assessment Details Patient Name: Date of Service: [name] [name] 05/04/2023 1:00 PM Medical Record Number: [id number] Patient Account Number: [id number] Date of Birth/Sex: Treating RN: 23-Dec-1955 (67 y.o. [name] Primary Care Carlette Palmatier: [name] Other Clinician: Referring Georgette Helmer: Treating Nathanuel Cabreja/Extender: Matthew Folks in Treatment: 41 Edema Assessment Assessed: [Left: No] [Right: No] Edema: [Left: Yes] [Right: Yes] Calf Left: Right: Point of Measurement: 36 cm From Medial Instep 57.5 cm 64 cm Ankle Left:  Right: Point of Measurement: 12 cm From Medial Instep 31.5 cm 32.3 cm Vascular Assessment Pulses: Dorsalis Pedis Palpable: [Left:Yes] [Right:Yes] Extremity colors, hair growth, and conditions: Extremity Color: [Left:Hyperpigmented] [Right:Hyperpigmented] Hair Growth on Extremity: [Left:No] [Right:No] Temperature of Extremity: [Left:Warm < 3 seconds] [Right:Warm < 3 seconds] Toe Nail Assessment Left: Right: Thick: Yes Yes Discolored: Yes Yes Deformed: Yes Yes Improper Length and Hygiene: Yes Yes Electronic Signature(s) Signed: 05/04/2023 3:56:38 PM By: Angelina Pih Entered By: Angelina Pih on 05/04/2023 10:12:24 [name] ([id number]) [id number] Page 4 of 9 -------------------------------------------------------------------------------- Multi Wound Chart Details Patient Name: Date of Service: [name] 05/04/2023 1:00 PM Medical Record Number: [id number] Patient Account Number: [id number] Date of Birth/Sex: Treating RN: [birth year] (67 y.o. [name] Primary Care Iridiana Fonner: [name] Other Clinician: Referring Majestic Brister: Treating Barba Solt/Extender: Matthew Folks in Treatment: 80 Vital Signs Height(in): 73 Pulse(bpm): 89 Weight(lbs): 358 Blood Pressure(mmHg): 115/71 Body Mass Index(BMI): 47.2 Temperature(F): 97.9 Respiratory Rate(breaths/min): 18 [3:Photos:] [N/A:N/A] Right Lower Leg N/A N/A Wound Location: Gradually Appeared N/A N/A Wounding Event: Lymphedema N/A N/A Primary Etiology: Arrhythmia, Congestive Heart Failure, N/A N/A Comorbid History: Hypertension, Type II Diabetes 02/04/2023 N/A N/A Date Acquired: 12 N/A N/A Weeks of Treatment: Open N/A N/A Wound Status: No N/A N/A Wound Recurrence: 0.5x0.5x0.1 N/A N/A Measurements L x W x D (cm) 0.196 N/A N/A A (cm) : rea 0.02 N/A N/A Volume (cm) : 84.00% N/A N/A % Reduction in A rea: 91.80% N/A N/A % Reduction in  Volume: Partial Thickness N/A N/A Classification: Medium N/A N/A Exudate A mount: Serosanguineous N/A N/A Exudate Type: red, brown N/A N/A Exudate Color: Large (67-100%) N/A N/A Granulation A mount: Red N/A N/A Granulation Quality: Small (1-33%) N/A N/A Necrotic A mount: Fat Layer (Subcutaneous Tissue): Yes N/A N/A Exposed Structures: Fascia: No Tendon: No Muscle: No Joint: No Bone: No Small (1-33%) N/A N/A Epithelialization: Treatment Notes Electronic Signature(s) Signed: 05/04/2023 3:56:38 PM By: Angelina Pih Entered By: Angelina Pih on 05/04/2023 10:24:03 [name] ([id number]) [id number] Page 5 of 9 -------------------------------------------------------------------------------- Multi-Disciplinary Care Plan Details Patient Name: Date of Service: [name] 05/04/2023 1:00 PM Medical Record Number: [id number] Patient Account Number: [id number] Date of Birth/Sex: Treating RN:  Feb 01, 1956 (66 y.o. [name] Primary Care Nocole Zammit: [name] Other Clinician: Referring Marsena Taff: Treating Sabriah Hobbins/Extender: Matthew Folks in Treatment: 26 Active Inactive Venous Leg Ulcer Nursing Diagnoses: Actual venous Insuffiency (use after diagnosis is confirmed) Goals: Patient will maintain optimal edema control Date Initiated: 10/15/2022 Target Resolution Date: 06/12/2023 Goal Status: Active Patient/caregiver will verbalize understanding of disease process and disease management Date Initiated: 10/15/2022 Date Inactivated: 11/12/2022 Target Resolution Date: 10/15/2022 Goal Status: Met Verify adequate tissue perfusion prior to therapeutic compression application Date Initiated: 10/15/2022 Date Inactivated: 11/12/2022 Target Resolution Date: 10/15/2022 Goal Status: Met Interventions: Assess peripheral edema status every visit. Compression as ordered Provide education on venous insufficiency Treatment  Activities: Therapeutic compression applied : 10/15/2022 Notes: Electronic Signature(s) Signed: 05/04/2023 1:39:33 PM By: Angelina Pih Entered By: Angelina Pih on 05/04/2023 10:39:32 -------------------------------------------------------------------------------- Non-Wound Condition Assessment Details Patient Name: Date of Service: [name] [name] 05/04/2023 1:00 PM Medical Record Number: [id number] Patient Account Number: [id number] Date of Birth/Sex: Treating RN: 1956/03/29 (67 y.o. [name] Primary Care Tiare Rohlman: [name] Other Clinician: Referring Daimon Kean: Treating Devinne Epstein/Extender: Shana, Salberg ([id number]) [id number] Page 6 of 9 Weeks in Treatment: 41 Non-Wound Condition: Condition: Lymphedema Location: Leg Side: Left Photos Notes scattered open areas noted Electronic Signature(s) Signed: 05/04/2023 3:56:38 PM By: Angelina Pih Entered By: Angelina Pih on 05/04/2023 10:23:51 -------------------------------------------------------------------------------- Pain Assessment Details Patient Name: Date of Service: [name] [name] 05/04/2023 1:00 PM Medical Record Number: [id number] Patient Account Number: [id number] Date of Birth/Sex: Treating RN: 29-Apr-1956 (67 y.o. [name] Primary Care Driana Dazey: [name] Other Clinician: Referring Lysette Lindenbaum: Treating Leira Regino/Extender: Matthew Folks in Treatment: 68 Active Problems Location of Pain Severity and Description of Pain Patient Has Paino Yes Site Locations Rate the pain. Current Pain Level: 6 Matthew, [name] ([id number]) [id number] Page 7 of 9 Pain Management and Medication Current Pain Management: Electronic Signature(s) Signed: 05/04/2023 3:56:38 PM By: Angelina Pih Entered By: Angelina Pih on 05/04/2023  10:04:10 -------------------------------------------------------------------------------- Patient/Caregiver Education Details Patient Name: Date of Service: [name] 11/19/2024andnbsp1:00 PM Medical Record Number: [id number] Patient Account Number: [id number] Date of Birth/Gender: Treating RN: [birth year] (67 y.o. [name] Primary Care Physician: [name] Other Clinician: Referring Physician: Treating Physician/Extender: Matthew Folks in Treatment: 72 Education Assessment Education Provided To: Patient Education Topics Provided Wound/Skin Impairment: Handouts: Caring for Your Ulcer Methods: Explain/Verbal Responses: State content correctly Electronic Signature(s) Signed: 05/04/2023 3:56:38 PM By: Angelina Pih Entered By: Angelina Pih on 05/04/2023 10:39:43 -------------------------------------------------------------------------------- Wound Assessment Details Patient Name: Date of Service: [name] [name] 05/04/2023 1:00 PM Medical Record Number: [id number] Patient Account Number: [id number] Date of Birth/Sex: Treating RN: 01/20/1956 (67 y.o. [name] Primary Care Szymon Foiles: [name] Other Clinician: Referring Khamya Topp: Treating Dossie Swor/Extender: Matthew Folks in Treatment: 41 Wound Status Wound Number: 3 Primary Lymphedema Etiology: Matthew, [name] ([id number]) [phone].pdf Page 8 of 9 Etiology: Wound Location: Right Lower Leg Wound Status: Open Wounding Event: Gradually Appeared Comorbid Arrhythmia, Congestive Heart Failure, Hypertension, Type II Date Acquired: 02/04/2023 History: Diabetes Weeks Of Treatment: 12 Clustered Wound: No Photos Wound Measurements Length: (cm) 0.5 Width: (cm) 0.5 Depth: (cm) 0.1 Area: (cm) 0.196 Volume: (cm) 0.02 % Reduction in Area: 84% % Reduction in Volume: 91.8% Epithelialization: Small (1-33%) Tunneling:  No Undermining: No Wound Description Classification: Partial Thickness Exudate Amount: Medium Exudate Type: Serosanguineous Exudate Color: red, brown Foul Odor After Cleansing:  No Slough/Fibrino Yes Wound Bed Granulation Amount: Large (67-100%) Exposed Structure Granulation Quality: Red Fascia Exposed: No Necrotic Amount: Small (1-33%) Fat Layer (Subcutaneous Tissue) Exposed: Yes Necrotic Quality: Adherent Slough Tendon Exposed: No Muscle Exposed: No Joint Exposed: No Bone Exposed: No Treatment Notes Wound #3 (Lower Leg) Wound Laterality: Right Cleanser Soap and Water Discharge Instruction: Gently cleanse wound with antibacterial soap, rinse and pat dry prior to dressing wounds Peri-Wound Care Topical Primary Dressing Silvercel Small 2x2 (in/in) Discharge Instruction: Apply Silvercel Small 2x2 (in/in) as instructed Secondary Dressing Coverlet Latex-Free Fabric Adhesive Dressings Discharge Instruction: 1.5 x 2 Secured With Compression Wrap Compression Stockings Add-Ons Electronic Signature(s) Signed: 05/04/2023 3:56:38 PM By: Angelina Pih Entered By: Angelina Pih on 05/04/2023 10:11:26 [name] ([id number]) [id number] Page 9 of 9 -------------------------------------------------------------------------------- Vitals Details Patient Name: Date of Service: [name] 05/04/2023 1:00 PM Medical Record Number: [id number] Patient Account Number: [id number] Date of Birth/Sex: Treating RN: December 28, 1955 (67 y.o. [name] Primary Care Mathilda Maguire: [name] Other Clinician: Referring Cyriah Childrey: Treating Braelin Costlow/Extender: Matthew Folks in Treatment: 45 Vital Signs Time Taken: 13:02 Temperature (F): 97.9 Height (in): 73 Pulse (bpm): 89 Weight (lbs): 358 Respiratory Rate (breaths/min): 18 Body Mass Index (BMI): 47.2 Blood Pressure (mmHg): 115/71 Reference Range: 80 - 120 mg / dl Electronic  Signature(s) Signed: 05/04/2023 3:56:38 PM By: Angelina Pih Entered By: Angelina Pih on 05/04/2023 10:04:02

## 2023-05-18 ENCOUNTER — Ambulatory Visit: Payer: PPO | Admitting: Physician Assistant

## 2023-05-27 ENCOUNTER — Encounter: Payer: PPO | Attending: Physician Assistant | Admitting: Physician Assistant

## 2023-05-27 DIAGNOSIS — E11622 Type 2 diabetes mellitus with other skin ulcer: Secondary | ICD-10-CM | POA: Insufficient documentation

## 2023-05-27 DIAGNOSIS — I110 Hypertensive heart disease with heart failure: Secondary | ICD-10-CM | POA: Insufficient documentation

## 2023-05-27 DIAGNOSIS — L97228 Non-pressure chronic ulcer of left calf with other specified severity: Secondary | ICD-10-CM | POA: Diagnosis not present

## 2023-05-27 DIAGNOSIS — I499 Cardiac arrhythmia, unspecified: Secondary | ICD-10-CM | POA: Insufficient documentation

## 2023-05-27 DIAGNOSIS — I5042 Chronic combined systolic (congestive) and diastolic (congestive) heart failure: Secondary | ICD-10-CM | POA: Insufficient documentation

## 2023-05-27 DIAGNOSIS — I890 Lymphedema, not elsewhere classified: Secondary | ICD-10-CM | POA: Insufficient documentation

## 2023-05-27 DIAGNOSIS — L97812 Non-pressure chronic ulcer of other part of right lower leg with fat layer exposed: Secondary | ICD-10-CM | POA: Insufficient documentation

## 2023-05-27 DIAGNOSIS — L97822 Non-pressure chronic ulcer of other part of left lower leg with fat layer exposed: Secondary | ICD-10-CM | POA: Diagnosis not present

## 2023-05-27 DIAGNOSIS — I872 Venous insufficiency (chronic) (peripheral): Secondary | ICD-10-CM | POA: Diagnosis not present

## 2023-05-27 NOTE — Progress Notes (Addendum)
EBB, DAVIDHEISER ([id number]) [id number] Page 1 of 10 Visit Report for 05/27/2023 Chief Complaint Document Details Patient Name: Date of Service: [name] 05/27/2023 10:45 A M Medical Record Number: [id number] Patient Account Number: [id number] Date of Birth/Sex: Treating RN: [birth year] (67 y.o. [name] Primary Care Provider: [name] Other Clinician: [name] Referring Provider: Treating Provider/Extender: Matthew Folks in Treatment: 45 Information Obtained from: Patient Chief Complaint Bilateral LE ulcers and lymphedema Electronic Signature(s) Signed: 05/27/2023 10:52:01 AM By: Allen Derry PA-C Entered By: Allen Derry on 05/27/2023 10:52:01 -------------------------------------------------------------------------------- HPI Details Patient Name: Date of Service: [name], [name] 05/27/2023 10:45 A M Medical Record Number: [id number] Patient Account Number: [id number] Date of Birth/Sex: Treating RN: [birth year] (67 y.o. [name] Primary Care Provider: [name] Other Clinician: [name] Referring Provider: Treating Provider/Extender: Matthew Folks in Treatment: 45 History of Present Illness HPI Description: 07-16-2022 upon evaluation today patient appears to be doing poorly currently in regard to his his legs. The left leg is actually worse than the right. This is a patient that is previously been seen in the [place] office and at that point was doing really quite well with compression wrapping and often alginate are either Hydrofera Blue dressings. Fortunately he has gone since November 2020 until just current with out having any issue or need for wound care services which is great news. Unfortunately he is here today because he is having some issues at this point. Patient does have a history of several medical conditions which include diabetes mellitus type  2, lymphedema, hypertension, congestive heart failure, and cardiac arrhythmia though is not sure that this is actually atrial fibrillation based on what he has been told. He does have lymphedema pumps but tells me he has not used them since the summer when he had to move to a remodel of his building and subsequently never unpacked them. 07-23-2022 upon evaluation today patient appears to be doing well with regard to his legs. He is going require some sharp debridement today but overall seems to be making some progress. I am pleased in that regard he has much less drainage that he had did last week I think the compression wraps are definitely helping. 07-30-2022 upon evaluation today patient appears to be doing poorly in regard to his legs he actually has blue-green drainage which I think is consistent with Pseudomonas. I believe that we need to address this as soon as possible and I discussed that with the patient today as well. Fortunately I do not see any signs of active infection locally nor systemically at this time which is great news. No fevers, chills, nausea, vomiting, or diarrhea. RENNARD, DERUITER ([id number]) [id number] Page 2 of 10 08-13-2022 upon evaluation today patient appears to be doing well currently in regard to his leg ulcers which are actually looking much better. Fortunately there does not appear to be any signs of active infection at this time which is great news. No fevers, chills, nausea, vomiting, or diarrhea. 3/7; patient with predominantly a left medial lower extremity wound as well as several smaller areas and a small area on the right lateral. His Jodie Echevaria came in today. We are using silver alginate as the primary dressing 09-03-2022 upon evaluation today patient appears to be doing well currently in regard to his wounds. He has been tolerating the dressing changes without complication. Fortunately there does not appear to be any signs of infection  there is  needed however for sharp debridement. 09-10-2022 upon evaluation today patient appears to be doing well currently in regard to his wound. Has been tolerating the dressing changes without complication. Fortunately there does not appear to be any signs of infection looking systemically which is great news and overall I am extremely pleased with where we stand today. I do not see any signs of infection. 09-17-2022 upon evaluation today patient actually is making signs of improvement the good news is he is making good improvement. With that being said he still is having quite a time keeping the swelling down. We have been using Tubigrip I think that still probably our best option but nonetheless I think that we are making progress with regard to his wounds. 09-24-2022 upon evaluation today patient appears to be doing well currently in regard to his wounds is continuing to make improvements at this point which is great news and overall I am extremely pleased with where we stand today. Fortunately I do not see any evidence of active infection at this time which is great news and in general I think he is doing quite well with the [facility] and silver alginate. 10-01-2022 upon evaluation today patient appears to be doing pretty well currently in regard to his wounds. He has been tolerating the dressing changes without complication. Fortunately I do not see any evidence of active infection locally nor systemically which is great news and I do believe that 10-08-2022 upon evaluation today patient's wounds actually are showing some signs of improvement which is great news. Still this is very slow to heal I really feel like he would do better if we can get him in some stronger compression wrapping he does have the juxta lite compression wraps therefore we can actually see about doing that over top of his Tubigrip to see if that can be beneficial. He does not love the hide he had as they are not the most  comfortable thing for him but nonetheless I think it would be beneficial he is in agreement with going forward with this. 10-15-2022 upon evaluation today patient appears to be doing well currently in regard to his wounds that do not appear to be infected which is good news. With that being said unfortunately he still continues to have significant amount of swelling we really need to do something to try to get the swelling under control. I do not see any signs of infection locally nor systemically but at the same time the amount of edema is tremendous. The juxta lite helps but again he is only taking the fluid pills once or twice a week due to the amount that makes him go to the restroom. Therefore he does not take this daily which is really what he needs on a more regular basis. We need to try to get some compression on and is a little bit stronger. 10-22-2022 upon evaluation today patient actually appears to be making some really good progress in regard to his wound. The compression wrap was put on last time actually doing a great job he looks much improved and in general I feel like that we are making great progress. I do not see any evidence of active infection locally nor systemically which is great news. 10-29-2022 upon evaluation today patient appears to be doing decently well in regard to his legs currently. I feel like that his swelling is much better I feel like that he is also doing much better in regard to the wounds in general. I  do not see any signs of active infection which is great news and in general I think that he is making excellent progress towards complete resolution. The patient does seem to have 1 issue that is with his wraps actually slipping down working I definitely need to work on that aspect. 11-05-2022 upon evaluation today patient appears to be doing excellent in regard to his wound. He has been tolerating the dressing changes without complication. He actually seems to be  making excellent progress I am extremely pleased with where things stand currently. I do believe that we are really making good progress and I think that we are on the right track towards complete closure I think the compression wrap has been a dramatic shift in a good way for him as far as getting the wounds healed a lot of these areas that we will continue to leave Completely sealed up. 11-12-2022 upon evaluation today patient appears to be doing well currently in regard to his leg ulcer. The compression wrapping seems to be doing an excellent job. Fortunately I do not see any evidence of active infection locally nor systemically which is great news and in general I do believe that we are making good headway here towards complete closure. 11-19-2022 upon evaluation today patient appears to be doing well currently in regard to his wounds. He is actually showing signs of excellent improvement and very pleased with where we stand I think that he is making great progress. I do not see any evidence of infection at this time which is great news. 11-26-2022 upon evaluation today patient appears to be doing well currently in regard to his wound. He is actually been tolerating the dressing changes without complication. Fortunately I do not see any evidence of active infection locally nor systemically which is great news I think he is making excellent progress here towards closure. 12-03-2022 upon evaluation today patient appears to be doing well currently in regard to his wound. He in fact at both locations is doing great and seems to be making excellent progress and very pleased in that regard. I do not see any signs of active infection at this time. 12-10-2022 upon evaluation patient's wound is actually significantly smaller with one of the satellite lesions closed and there is just 1 small area remaining and this is actually doing quite well. I am very pleased with her progress. 12-15-2022 upon evaluation today  patient appears to be doing actually pretty well in regard to his wound on the left leg. He does have a little area on the backside that open that was previously close last week due to the fact that his wrap got wet over the weekend and he had to make do with stuff they can find at the drugstore. They made this work out however and the good news is he actually seems to be doing significantly better and the wound looks fine except for the fact that he has a couple of areas that just reopen on the backside very tiny. Nonetheless I do believe that we can go ahead and see what we do about trying to get this moving in the right direction yet again. 12-22-2022 upon evaluation today patient appears to be doing poorly in regard to his wrap slid down and his leg is extremely swollen. It is also painful secondary to it being so swollen. Fortunately I do not see any evidence of infection right now but at the same time I do believe that if we do not get  the swelling under control is not really good to alleviate his discomfort. We definitely can need to change his wrap out however in short order. 12-31-2022 upon evaluation today patient appears to be doing well currently in regard to his wound. He has been tolerating the dressing changes without complication. This actually looks to be doing much better today compared to where we were. Fortunately I do not see any evidence of active infection locally or systemically which is great news. 01-11-2023 upon evaluation today patient appears to be doing well currently in regard to his wound which is actually showing signs of significant improvement. Fortunately I do not see any signs of infection locally or systemically which is great news and in general I do believe that we are making good headway towards complete closure. 01-19-2023 upon evaluation today patient appears to be doing well currently hemoglobin to his wound. He has been tolerating the dressing changes  without complication. Fortunately the wound is not infected unfortunately it is a little bigger due to the fact that the wrap slid down. I do not see any signs of worsening or infection at this time. 01-28-2023 upon evaluation today patient appears to be doing well currently in regard to his wound. He has been tolerating the dressing changes without complication. Fortunately I do not see any signs of infection the compression wrap is doing a really good job and I think it worked pretty much just about healed. SAEED, ALLEYNE ([id number]) [id number] Page 3 of 10 02-04-2023 upon evaluation today patient appears to be doing excellent in regard to his wounds. In fact he is pretty much completely healed on the left although I think this may have just a pinpoint opening. Nonetheless I think it is 1 more week to toughen up. 02-16-2023 upon evaluation today patient appears to be doing well currently in regard to his wound. He has been tolerating the dressing changes without complication. Fortunately there does not appear to be any evidence of active infection locally or systemically which is good news. In fact the original wounds we been taking care of on the left and right legs are completely healed unfortunately his wrap slipped down the right leg and there are 2 small areas on the anterior portion of his leg which are open at this point. When I wrapped his right leg the left leg is completely closed however he is in agreement to his own compression sock. 02-26-2023 upon evaluation today patient appears to be doing well currently in regard to his left leg which is still close in the right leg is doing significantly better. Fortunately there does not appear to be any signs of active infection locally or systemically which is great news and in general I do believe that we will making headway towards complete closure. 03-05-2023 upon evaluation today patient actually appears to be  doing excellent in regard to his wounds one of the areas healed the other is significantly improved. I am actually very pleased with where we stand today and I think that he is doing well with his compression stockings which is excellent as well. 03-19-2023 upon evaluation today patient appears to be doing poorly currently in regard to his wounds. He seems to be doing a bit worse as far as both legs are concerned as far as open wounds and I think that we are going need to address this rapidly to keep it from backtracking and getting significantly worse. He voiced understanding he kind of somewhat expected I believe. 03-29-2023  upon evaluation patient actually showing signs of definite improvement with regard to his wounds currently with some already drying up and on the right lateral leg this is much smaller. Will continue to wrap him this seems to be doing quite well. 04-06-2023 upon evaluation today patient's wounds on the legs actually appear to be doing better which is great news. Fortunately I do not see any evidence of worsening overall and I do believe that the patient is making good headway towards closure which is good news. I do not see any signs of infection which is also excellent. He actually only had the wraps on for a couple of days before they started slipping down he had cut them off and therefore spent the remaining 5 days pretty much just in his compression socks he seems of done very well though. 04-13-2023 upon evaluation patient is down to just 1 wound on the right leg everything appears to be doing excellent I think he is making excellent progress towards complete closure. Fortunately I do not see any signs of active infection locally or systemically which is great news. 04-20-2023 upon evaluation today patient appears to be doing well currently in regard to his wound. He has been tolerating the dressing changes without complication. Fortunately there does not appear to be any  signs of active infection at this time. No fevers, chills, nausea, vomiting, or diarrhea. 04-27-2023 upon evaluation today patient appears to be doing a little worse with some irritation around the sides of his wounds. I am not sure if he has been wearing his compression all the time but he does tell me which I Cellerate well that his dressings were not quite in place and therefore there is more drainage and irritation I think due to the fact that he had accidentally put it in the wrong location on his right leg. Nonetheless the wound does not Bigger but this just some irritation of the skin around. 05-04-2023 upon evaluation today patient's wounds are actually showing some signs of improvement here which is great news. Fortunately I do not see any signs of worsening overall and I believe that he is making good headway here towards closure which is great news. 05-27-2023 upon evaluation today patient appears to be doing little better in regard to his wounds. Fortunately I do not see any signs of active infection at this time. He is using his own compression stockings and he has no new skin tear/injury from tape he is doing much better in my opinion. Electronic Signature(s) Signed: 05/27/2023 12:50:19 PM By: Allen Derry PA-C Entered By: Allen Derry on 05/27/2023 12:50:19 -------------------------------------------------------------------------------- Physical Exam Details Patient Name: Date of Service: [name] 05/27/2023 10:45 A M Medical Record Number: [id number] Patient Account Number: [id number] Date of Birth/Sex: Treating RN: 06-Feb-1956 (67 y.o. [name] Primary Care Provider: [name] Other Clinician: [name] Referring Provider: Treating Provider/Extender: Matthew Folks in Treatment: 45 Constitutional Obese and well-hydrated in no acute distress. Respiratory normal breathing without difficulty. Psychiatric this patient is able to  make decisions and demonstrates good insight into disease process. Alert and Oriented x 3. pleasant and cooperative. Notes RAHZEL, VIAU ([id number]) [id number] Page 4 of 10 Upon inspection patient's wounds again are showing signs of still being present but at the same time I feel like he is controlling his swelling decently well with his compression stockings. I discussed with him that I think he still can need to continue with that currently the  problem with the compression wraps as we cannot keep them up they constantly slip down and then he ends up with additional injury. Again I am trying to avoid having to go back to compression wraps for that reason. Electronic Signature(s) Signed: 05/27/2023 12:50:47 PM By: Allen Derry PA-C Entered By: Allen Derry on 05/27/2023 12:50:47 -------------------------------------------------------------------------------- Physician Orders Details Patient Name: Date of Service: [name], [name] 05/27/2023 10:45 A M Medical Record Number: [id number] Patient Account Number: [id number] Date of Birth/Sex: Treating RN: Nov 12, 1955 (67 y.o. [name] Primary Care Provider: [name] Other Clinician: [name] Referring Provider: Treating Provider/Extender: Matthew Folks in Treatment: 45 The following information was scribed by: [name] The information was scribed for: Allen Derry Verbal / Phone Orders: No Diagnosis Coding ICD-10 Coding Code Description E11.622 Type 2 diabetes mellitus with other skin ulcer I89.0 Lymphedema, not elsewhere classified L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 L97.812 Non-pressure chronic ulcer of other part of right lower leg with fat layer exposed I10 Essential (primary) hypertension I50.42 Chronic combined systolic (congestive) and diastolic (congestive) heart failure I49.9 Cardiac arrhythmia,  unspecified Follow-up Appointments Return Appointment in 2 weeks. Nurse Visit as needed - PRN Bathing/ Shower/ Hygiene May shower with wound dressing protected with water repellent cover or cast protector. Anesthetic (Use 'Patient Medications' Section for Anesthetic Order Entry) Lidocaine applied to wound bed Edema Control - Orders / Instructions Patient to wear own compression stockings. Remove compression stockings every night before going to bed and put on every morning when getting up. Elevate, Exercise Daily and A void Standing for Long Periods of Time. Elevate leg(s) parallel to the floor when sitting. DO YOUR BEST to sleep in the bed at night. DO NOT sleep in your recliner. Long hours of sitting in a recliner leads to swelling of the legs and/or potential wounds on your backside. Non-Wound Condition Right Lower Extremity dditional non-wound orders/instructions: - A and D and abd to right heel, small piece of silvercell to area on LLL A Wound Treatment Wound #3 - Lower Leg Wound Laterality: Right Cleanser: Soap and Water 1 x Per Day/30 Days Discharge Instructions: Gently cleanse wound with antibacterial soap, rinse and pat dry prior to dressing wounds Prim Dressing: Silvercel Small 2x2 (in/in) 1 x Per Day/30 Days ary Discharge Instructions: Apply Silvercel Small 2x2 (in/in) as instructed KHUONG, CRISCIONE ([id number]) [id number] Page 5 of 10 Secondary Dressing: ABD Pad 5x9 (in/in) 1 x Per Day/30 Days Discharge Instructions: Cover with ABD pad Secured With: Medipore T - 31M Medipore H Soft Cloth Surgical T ape ape, 2x2 (in/yd) 1 x Per Day/30 Days Wound #6 - Lower Leg Wound Laterality: Left, Posterior Cleanser: Soap and Water 1 x Per Day/30 Days Discharge Instructions: Gently cleanse wound with antibacterial soap, rinse and pat dry prior to dressing wounds Prim Dressing: Silvercel Small 2x2 (in/in) 1 x Per Day/30 Days ary Discharge Instructions: Apply  Silvercel Small 2x2 (in/in) as instructed Secondary Dressing: ABD Pad 5x9 (in/in) 1 x Per Day/30 Days Discharge Instructions: Cover with ABD pad Secured With: Medipore T - 31M Medipore H Soft Cloth Surgical T ape ape, 2x2 (in/yd) 1 x Per Day/30 Days Wound #7 - Lower Leg Wound Laterality: Right, Lateral, Distal Cleanser: Soap and Water 1 x Per Day/30 Days Discharge Instructions: Gently cleanse wound with antibacterial soap, rinse and pat dry prior to dressing wounds Prim Dressing: Silvercel Small 2x2 (in/in) 1 x Per Day/30 Days ary  Discharge Instructions: Apply Silvercel Small 2x2 (in/in) as instructed Secondary Dressing: ABD Pad 5x9 (in/in) 1 x Per Day/30 Days Discharge Instructions: Cover with ABD pad Secured With: Medipore T - 47M Medipore H Soft Cloth Surgical T ape ape, 2x2 (in/yd) 1 x Per Day/30 Days Electronic Signature(s) Signed: 05/27/2023 4:38:12 PM By: Allen Derry PA-C Signed: 05/31/2023 2:04:51 PM By: [name] Entered By: [name] on 05/27/2023 11:37:39 -------------------------------------------------------------------------------- Problem List Details Patient Name: Date of Service: [name] 05/27/2023 10:45 A M Medical Record Number: [id number] Patient Account Number: [id number] Date of Birth/Sex: Treating RN: 1956/01/16 (68 y.o. [name] Primary Care Provider: [name] Other Clinician: [name] Referring Provider: Treating Provider/Extender: Matthew Folks in Treatment: 45 Active Problems ICD-10 Encounter Code Description Active Date MDM Diagnosis E11.622 Type 2 diabetes mellitus with other skin ulcer 07/16/2022 No Yes I89.0 Lymphedema, not elsewhere classified 07/16/2022 No Yes L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2/06/2022 No Yes DALTIN, SERANO ([id number]) [phone].pdf Page 6 of 10 [phone] Non-pressure chronic ulcer of other  part of right lower leg with fat layer 07/16/2022 No Yes exposed I10 Essential (primary) hypertension 07/16/2022 No Yes I50.42 Chronic combined systolic (congestive) and diastolic (congestive) heart failure 07/16/2022 No Yes I49.9 Cardiac arrhythmia, unspecified 07/16/2022 No Yes Inactive Problems Resolved Problems Electronic Signature(s) Signed: 05/27/2023 10:51:58 AM By: Allen Derry PA-C Entered By: Allen Derry on 05/27/2023 10:51:58 -------------------------------------------------------------------------------- Progress Note Details Patient Name: Date of Service: [name], [name] 05/27/2023 10:45 A M Medical Record Number: [id number] Patient Account Number: [id number] Date of Birth/Sex: Treating RN: 1956-04-29 (67 y.o. [name] Primary Care Provider: [name] Other Clinician: [name] Referring Provider: Treating Provider/Extender: Matthew Folks in Treatment: 45 Subjective Chief Complaint Information obtained from Patient Bilateral LE ulcers and lymphedema History of Present Illness (HPI) 07-16-2022 upon evaluation today patient appears to be doing poorly currently in regard to his his legs. The left leg is actually worse than the right. This is a patient that is previously been seen in the [place] office and at that point was doing really quite well with compression wrapping and often alginate are either Hydrofera Blue dressings. Fortunately he has gone since November 2020 until just current with out having any issue or need for wound care services which is great news. Unfortunately he is here today because he is having some issues at this point. Patient does have a history of several medical conditions which include diabetes mellitus type 2, lymphedema, hypertension, congestive heart failure, and cardiac arrhythmia though is not sure that this is actually atrial fibrillation based on what he has been told. He does have lymphedema pumps but  tells me he has not used them since the summer when he had to move to a remodel of his building and subsequently never unpacked them. 07-23-2022 upon evaluation today patient appears to be doing well with regard to his legs. He is going require some sharp debridement today but overall seems to be making some progress. I am pleased in that regard he has much less drainage that he had did last week I think the compression wraps are definitely helping. 07-30-2022 upon evaluation today patient appears to be doing poorly in regard to his legs he actually has blue-green drainage which I think is consistent with Pseudomonas. I believe that we need to address this as soon as possible and I discussed that with the patient  today as well. Fortunately I do not see any signs of active infection locally nor systemically at this time which is great news. No fevers, chills, nausea, vomiting, or diarrhea. 08-13-2022 upon evaluation today patient appears to be doing well currently in regard to his leg ulcers which are actually looking much better. Fortunately there does not appear to be any signs of active infection at this time which is great news. No fevers, chills, nausea, vomiting, or diarrhea. LADAINIAN, BIEDRON ([id number]) [id number] Page 7 of 10 3/7; patient with predominantly a left medial lower extremity wound as well as several smaller areas and a small area on the right lateral. His Jodie Echevaria came in today. We are using silver alginate as the primary dressing 09-03-2022 upon evaluation today patient appears to be doing well currently in regard to his wounds. He has been tolerating the dressing changes without complication. Fortunately there does not appear to be any signs of infection there is needed however for sharp debridement. 09-10-2022 upon evaluation today patient appears to be doing well currently in regard to his wound. Has been tolerating the dressing changes  without complication. Fortunately there does not appear to be any signs of infection looking systemically which is great news and overall I am extremely pleased with where we stand today. I do not see any signs of infection. 09-17-2022 upon evaluation today patient actually is making signs of improvement the good news is he is making good improvement. With that being said he still is having quite a time keeping the swelling down. We have been using Tubigrip I think that still probably our best option but nonetheless I think that we are making progress with regard to his wounds. 09-24-2022 upon evaluation today patient appears to be doing well currently in regard to his wounds is continuing to make improvements at this point which is great news and overall I am extremely pleased with where we stand today. Fortunately I do not see any evidence of active infection at this time which is great news and in general I think he is doing quite well with the [facility] and silver alginate. 10-01-2022 upon evaluation today patient appears to be doing pretty well currently in regard to his wounds. He has been tolerating the dressing changes without complication. Fortunately I do not see any evidence of active infection locally nor systemically which is great news and I do believe that 10-08-2022 upon evaluation today patient's wounds actually are showing some signs of improvement which is great news. Still this is very slow to heal I really feel like he would do better if we can get him in some stronger compression wrapping he does have the juxta lite compression wraps therefore we can actually see about doing that over top of his Tubigrip to see if that can be beneficial. He does not love the hide he had as they are not the most comfortable thing for him but nonetheless I think it would be beneficial he is in agreement with going forward with this. 10-15-2022 upon evaluation today patient appears to be doing well  currently in regard to his wounds that do not appear to be infected which is good news. With that being said unfortunately he still continues to have significant amount of swelling we really need to do something to try to get the swelling under control. I do not see any signs of infection locally nor systemically but at the same time the amount of edema is tremendous. The juxta lite helps but  again he is only taking the fluid pills once or twice a week due to the amount that makes him go to the restroom. Therefore he does not take this daily which is really what he needs on a more regular basis. We need to try to get some compression on and is a little bit stronger. 10-22-2022 upon evaluation today patient actually appears to be making some really good progress in regard to his wound. The compression wrap was put on last time actually doing a great job he looks much improved and in general I feel like that we are making great progress. I do not see any evidence of active infection locally nor systemically which is great news. 10-29-2022 upon evaluation today patient appears to be doing decently well in regard to his legs currently. I feel like that his swelling is much better I feel like that he is also doing much better in regard to the wounds in general. I do not see any signs of active infection which is great news and in general I think that he is making excellent progress towards complete resolution. The patient does seem to have 1 issue that is with his wraps actually slipping down working I definitely need to work on that aspect. 11-05-2022 upon evaluation today patient appears to be doing excellent in regard to his wound. He has been tolerating the dressing changes without complication. He actually seems to be making excellent progress I am extremely pleased with where things stand currently. I do believe that we are really making good progress and I think that we are on the right track towards  complete closure I think the compression wrap has been a dramatic shift in a good way for him as far as getting the wounds healed a lot of these areas that we will continue to leave Completely sealed up. 11-12-2022 upon evaluation today patient appears to be doing well currently in regard to his leg ulcer. The compression wrapping seems to be doing an excellent job. Fortunately I do not see any evidence of active infection locally nor systemically which is great news and in general I do believe that we are making good headway here towards complete closure. 11-19-2022 upon evaluation today patient appears to be doing well currently in regard to his wounds. He is actually showing signs of excellent improvement and very pleased with where we stand I think that he is making great progress. I do not see any evidence of infection at this time which is great news. 11-26-2022 upon evaluation today patient appears to be doing well currently in regard to his wound. He is actually been tolerating the dressing changes without complication. Fortunately I do not see any evidence of active infection locally nor systemically which is great news I think he is making excellent progress here towards closure. 12-03-2022 upon evaluation today patient appears to be doing well currently in regard to his wound. He in fact at both locations is doing great and seems to be making excellent progress and very pleased in that regard. I do not see any signs of active infection at this time. 12-10-2022 upon evaluation patient's wound is actually significantly smaller with one of the satellite lesions closed and there is just 1 small area remaining and this is actually doing quite well. I am very pleased with her progress. 12-15-2022 upon evaluation today patient appears to be doing actually pretty well in regard to his wound on the left leg. He does have a little area  on the backside that open that was previously close last week due to the  fact that his wrap got wet over the weekend and he had to make do with stuff they can find at the drugstore. They made this work out however and the good news is he actually seems to be doing significantly better and the wound looks fine except for the fact that he has a couple of areas that just reopen on the backside very tiny. Nonetheless I do believe that we can go ahead and see what we do about trying to get this moving in the right direction yet again. 12-22-2022 upon evaluation today patient appears to be doing poorly in regard to his wrap slid down and his leg is extremely swollen. It is also painful secondary to it being so swollen. Fortunately I do not see any evidence of infection right now but at the same time I do believe that if we do not get the swelling under control is not really good to alleviate his discomfort. We definitely can need to change his wrap out however in short order. 12-31-2022 upon evaluation today patient appears to be doing well currently in regard to his wound. He has been tolerating the dressing changes without complication. This actually looks to be doing much better today compared to where we were. Fortunately I do not see any evidence of active infection locally or systemically which is great news. 01-11-2023 upon evaluation today patient appears to be doing well currently in regard to his wound which is actually showing signs of significant improvement. Fortunately I do not see any signs of infection locally or systemically which is great news and in general I do believe that we are making good headway towards complete closure. 01-19-2023 upon evaluation today patient appears to be doing well currently hemoglobin to his wound. He has been tolerating the dressing changes without complication. Fortunately the wound is not infected unfortunately it is a little bigger due to the fact that the wrap slid down. I do not see any signs of worsening or infection at this  time. 01-28-2023 upon evaluation today patient appears to be doing well currently in regard to his wound. He has been tolerating the dressing changes without complication. Fortunately I do not see any signs of infection the compression wrap is doing a really good job and I think it worked pretty much just about healed. 02-04-2023 upon evaluation today patient appears to be doing excellent in regard to his wounds. In fact he is pretty much completely healed on the left although I think this may have just a pinpoint opening. Nonetheless I think it is 1 more week to toughen up. PHARRELL, NIKAS ([id number]) [id number] Page 8 of 10 02-16-2023 upon evaluation today patient appears to be doing well currently in regard to his wound. He has been tolerating the dressing changes without complication. Fortunately there does not appear to be any evidence of active infection locally or systemically which is good news. In fact the original wounds we been taking care of on the left and right legs are completely healed unfortunately his wrap slipped down the right leg and there are 2 small areas on the anterior portion of his leg which are open at this point. When I wrapped his right leg the left leg is completely closed however he is in agreement to his own compression sock. 02-26-2023 upon evaluation today patient appears to be doing well currently in regard to his left leg which is  still close in the right leg is doing significantly better. Fortunately there does not appear to be any signs of active infection locally or systemically which is great news and in general I do believe that we will making headway towards complete closure. 03-05-2023 upon evaluation today patient actually appears to be doing excellent in regard to his wounds one of the areas healed the other is significantly improved. I am actually very pleased with where we stand today and I think that he is doing well with his  compression stockings which is excellent as well. 03-19-2023 upon evaluation today patient appears to be doing poorly currently in regard to his wounds. He seems to be doing a bit worse as far as both legs are concerned as far as open wounds and I think that we are going need to address this rapidly to keep it from backtracking and getting significantly worse. He voiced understanding he kind of somewhat expected I believe. 03-29-2023 upon evaluation patient actually showing signs of definite improvement with regard to his wounds currently with some already drying up and on the right lateral leg this is much smaller. Will continue to wrap him this seems to be doing quite well. 04-06-2023 upon evaluation today patient's wounds on the legs actually appear to be doing better which is great news. Fortunately I do not see any evidence of worsening overall and I do believe that the patient is making good headway towards closure which is good news. I do not see any signs of infection which is also excellent. He actually only had the wraps on for a couple of days before they started slipping down he had cut them off and therefore spent the remaining 5 days pretty much just in his compression socks he seems of done very well though. 04-13-2023 upon evaluation patient is down to just 1 wound on the right leg everything appears to be doing excellent I think he is making excellent progress towards complete closure. Fortunately I do not see any signs of active infection locally or systemically which is great news. 04-20-2023 upon evaluation today patient appears to be doing well currently in regard to his wound. He has been tolerating the dressing changes without complication. Fortunately there does not appear to be any signs of active infection at this time. No fevers, chills, nausea, vomiting, or diarrhea. 04-27-2023 upon evaluation today patient appears to be doing a little worse with some irritation around the  sides of his wounds. I am not sure if he has been wearing his compression all the time but he does tell me which I Cellerate well that his dressings were not quite in place and therefore there is more drainage and irritation I think due to the fact that he had accidentally put it in the wrong location on his right leg. Nonetheless the wound does not Bigger but this just some irritation of the skin around. 05-04-2023 upon evaluation today patient's wounds are actually showing some signs of improvement here which is great news. Fortunately I do not see any signs of worsening overall and I believe that he is making good headway here towards closure which is great news. 05-27-2023 upon evaluation today patient appears to be doing little better in regard to his wounds. Fortunately I do not see any signs of active infection at this time. He is using his own compression stockings and he has no new skin tear/injury from tape he is doing much better in my opinion. Objective Constitutional Obese and well-hydrated  in no acute distress. Vitals Time Taken: 10:55 AM, Height: 73 in, Weight: 358 lbs, BMI: 47.2, Temperature: 97.7 F, Pulse: 74 bpm, Respiratory Rate: 18 breaths/min, Blood Pressure: 123/77 mmHg. Respiratory normal breathing without difficulty. Psychiatric this patient is able to make decisions and demonstrates good insight into disease process. Alert and Oriented x 3. pleasant and cooperative. General Notes: Upon inspection patient's wounds again are showing signs of still being present but at the same time I feel like he is controlling his swelling decently well with his compression stockings. I discussed with him that I think he still can need to continue with that currently the problem with the compression wraps as we cannot keep them up they constantly slip down and then he ends up with additional injury. Again I am trying to avoid having to go back to compression wraps for that  reason. Integumentary (Hair, Skin) Wound #3 status is Open. Original cause of wound was Gradually Appeared. The date acquired was: 02/04/2023. The wound has been in treatment 16 weeks. The wound is located on the Right Lower Leg. The wound measures 1cm length x 1.5cm width x 0.1cm depth; 1.178cm^2 area and 0.118cm^3 volume. There is Fat Layer (Subcutaneous Tissue) exposed. There is a medium amount of serosanguineous drainage noted. There is large (67-100%) red granulation within the wound bed. There is a small (1-33%) amount of necrotic tissue within the wound bed including Adherent Slough. Wound #6 status is Open. Original cause of wound was Blister. The date acquired was: 05/27/2023. The wound is located on the Left,Posterior Lower Leg. The wound measures 2cm length x 2.4cm width x 0.1cm depth; 3.77cm^2 area and 0.377cm^3 volume. There is a medium amount of serosanguineous drainage noted. Wound #7 status is Open. Original cause of wound was Blister. The date acquired was: 05/27/2023. The wound is located on the Right,Distal,Lateral Lower Leg. The wound measures 0.5cm length x 1cm width x 0.1cm depth; 0.393cm^2 area and 0.039cm^3 volume. There is Fat Layer (Subcutaneous Tissue) exposed. There is a medium amount of serosanguineous drainage noted. Assessment MOATH, LUM ([id number]) [id number] Page 9 of 10 Active Problems ICD-10 Type 2 diabetes mellitus with other skin ulcer Lymphedema, not elsewhere classified Non-pressure chronic ulcer of other part of left lower leg with fat layer exposed Non-pressure chronic ulcer of other part of right lower leg with fat layer exposed Essential (primary) hypertension Chronic combined systolic (congestive) and diastolic (congestive) heart failure Cardiac arrhythmia, unspecified Plan Follow-up Appointments: Return Appointment in 2 weeks. Nurse Visit as needed - PRN Bathing/ Shower/ Hygiene: May shower with wound dressing  protected with water repellent cover or cast protector. Anesthetic (Use 'Patient Medications' Section for Anesthetic Order Entry): Lidocaine applied to wound bed Edema Control - Orders / Instructions: Patient to wear own compression stockings. Remove compression stockings every night before going to bed and put on every morning when getting up. Elevate, Exercise Daily and Avoid Standing for Long Periods of Time. Elevate leg(s) parallel to the floor when sitting. DO YOUR BEST to sleep in the bed at night. DO NOT sleep in your recliner. Long hours of sitting in a recliner leads to swelling of the legs and/or potential wounds on your backside. Non-Wound Condition: Additional non-wound orders/instructions: - A and D and abd to right heel, small piece of silvercell to area on LLL WOUND #3: - Lower Leg Wound Laterality: Right Cleanser: Soap and Water 1 x Per Day/30 Days Discharge Instructions: Gently cleanse wound with antibacterial soap, rinse and pat dry prior to  dressing wounds Prim Dressing: Silvercel Small 2x2 (in/in) 1 x Per Day/30 Days ary Discharge Instructions: Apply Silvercel Small 2x2 (in/in) as instructed Secondary Dressing: ABD Pad 5x9 (in/in) 1 x Per Day/30 Days Discharge Instructions: Cover with ABD pad Secured With: Medipore T - 79M Medipore H Soft Cloth Surgical T ape ape, 2x2 (in/yd) 1 x Per Day/30 Days WOUND #6: - Lower Leg Wound Laterality: Left, Posterior Cleanser: Soap and Water 1 x Per Day/30 Days Discharge Instructions: Gently cleanse wound with antibacterial soap, rinse and pat dry prior to dressing wounds Prim Dressing: Silvercel Small 2x2 (in/in) 1 x Per Day/30 Days ary Discharge Instructions: Apply Silvercel Small 2x2 (in/in) as instructed Secondary Dressing: ABD Pad 5x9 (in/in) 1 x Per Day/30 Days Discharge Instructions: Cover with ABD pad Secured With: Medipore T - 79M Medipore H Soft Cloth Surgical T ape ape, 2x2 (in/yd) 1 x Per Day/30 Days WOUND #7: - Lower Leg  Wound Laterality: Right, Lateral, Distal Cleanser: Soap and Water 1 x Per Day/30 Days Discharge Instructions: Gently cleanse wound with antibacterial soap, rinse and pat dry prior to dressing wounds Prim Dressing: Silvercel Small 2x2 (in/in) 1 x Per Day/30 Days ary Discharge Instructions: Apply Silvercel Small 2x2 (in/in) as instructed Secondary Dressing: ABD Pad 5x9 (in/in) 1 x Per Day/30 Days Discharge Instructions: Cover with ABD pad Secured With: Medipore T - 79M Medipore H Soft Cloth Surgical T ape ape, 2x2 (in/yd) 1 x Per Day/30 Days 1. I would recommend that we have the patient going to continue to monitor for any signs of infection or worsening. Based on what I am seeing I do believe that he is making good headway here towards wounds closed. 2. I am going to recommend continue with silver cell. 3. Going to recommend he continue to elevate his leg is much as possible to help with edema control. We will see patient back for reevaluation in 1 week here in the clinic. If anything worsens or changes patient will contact our office for additional recommendations. Electronic Signature(s) Signed: 05/27/2023 12:51:24 PM By: Allen Derry PA-C Entered By: Allen Derry on 05/27/2023 12:51:24 [name] ([id number]) [id number] Page 10 of 10 -------------------------------------------------------------------------------- SuperBill Details Patient Name: Date of Service: [name] 05/27/2023 Medical Record Number: [id number] Patient Account Number: [id number] Date of Birth/Sex: Treating RN: Aug 04, 1955 (67 y.o. [name] Primary Care Provider: [name] Other Clinician: [name] Referring Provider: Treating Provider/Extender: Matthew Folks in Treatment: 45 Diagnosis Coding ICD-10 Codes Code Description E11.622 Type 2 diabetes mellitus with other skin ulcer I89.0 Lymphedema, not elsewhere classified L97.822  Non-pressure chronic ulcer of other part of left lower leg with fat layer exposed L97.812 Non-pressure chronic ulcer of other part of right lower leg with fat layer exposed I10 Essential (primary) hypertension I50.42 Chronic combined systolic (congestive) and diastolic (congestive) heart failure I49.9 Cardiac arrhythmia, unspecified Facility Procedures : CPT4 Code: [id number] Description: 99214 - WOUND CARE VISIT-LEV 4 EST PT Modifier: Quantity: 1 Physician Procedures : CPT4 Code Description Modifier [id number] - WC PHYS LEVEL 3 - EST PT ICD-10 Diagnosis Description E11.622 Type 2 diabetes mellitus with other skin ulcer I89.0 Lymphedema, not elsewhere classified L97.822 Non-pressure chronic ulcer of other part of  left lower leg with fat layer exposed L97.812 Non-pressure chronic ulcer of other part of right lower leg with fat layer exposed Quantity: 1 Electronic Signature(s) Signed: 05/27/2023 12:51:39 PM By: Allen Derry PA-C Entered By: Allen Derry on 05/27/2023 12:51:38

## 2023-05-27 NOTE — Progress Notes (Addendum)
[name] ([id number]) [id number] Page 1 of 11 Visit Report for 05/27/2023 Arrival Information Details Patient Name: Date of Service: [name] 05/27/2023 10:45 A M Medical Record Number: [id number] Patient Account Number: [id number] Date of Birth/Sex: Treating RN: 1956-04-28 (67 y.o. [name] Primary Care [name]: [name] Other Clinician: Betha [name] Referring [name]: Treating [name]/Extender: [name] in Treatment: 45 Visit Information History Since Last Visit All ordered tests and consults were completed: No Patient Arrived: [name] Added or deleted any medications: No Arrival Time: 10:49 Any new allergies or adverse reactions: No Transfer Assistance: None Had a fall or experienced change in No Patient Identification Verified: Yes activities of daily living that may affect Secondary Verification Process Completed: Yes risk of falls: Patient Requires Transmission-Based Precautions: No Signs or symptoms of abuse/neglect since last visito No Patient Has Alerts: No Hospitalized since last visit: No Implantable device outside of the clinic excluding No cellular tissue based products placed in the center since last visit: Has Dressing in Place as Prescribed: Yes Pain Present Now: No Electronic Signature(s) Signed: 05/31/2023 2:04:51 PM By: [name] Entered By: [name] on 05/27/2023 10:55:10 -------------------------------------------------------------------------------- Clinic Level of Care Assessment Details Patient Name: Date of Service: [name] 05/27/2023 10:45 A M Medical Record Number: [id number] Patient Account Number: [id number] Date of Birth/Sex: Treating RN: 1955-10-09 (67 y.o. [name] Primary Care Makyla Bye: [name] Other Clinician: Betha [name] Referring [name]: Treating [name]/Extender: [name] in  Treatment: 45 Clinic Level of Care Assessment Items TOOL 4 Quantity Score []  - 0 Use when only an EandM is performed on FOLLOW-UP visit ASSESSMENTS - Nursing Assessment / Reassessment X- 1 10 Reassessment of Co-morbidities (includes updates in patient status) X- 1 5 Reassessment of Adherence to Treatment Plan [name] ([id number]) [phone].pdf Page 2 of 11 ASSESSMENTS - Wound and Skin A ssessment / Reassessment []  - 0 Simple Wound Assessment / Reassessment - one wound X- 3 5 Complex Wound Assessment / Reassessment - multiple wounds []  - 0 Dermatologic / Skin Assessment (not related to wound area) ASSESSMENTS - Focused Assessment X- 1 5 Circumferential Edema Measurements - multi extremities []  - 0 Nutritional Assessment / Counseling / Intervention []  - 0 Lower Extremity Assessment (monofilament, tuning fork, pulses) []  - 0 Peripheral Arterial Disease Assessment (using hand held doppler) ASSESSMENTS - Ostomy and/or Continence Assessment and Care []  - 0 Incontinence Assessment and Management []  - 0 Ostomy Care Assessment and Management (repouching, etc.) PROCESS - Coordination of Care X - Simple Patient / Family Education for ongoing care 1 15 []  - 0 Complex (extensive) Patient / Family Education for ongoing care []  - 0 Staff obtains Chiropractor, Records, T Results / Process Orders est []  - 0 Staff telephones HHA, Nursing Homes / Clarify orders / etc []  - 0 Routine Transfer to another Facility (non-emergent condition) []  - 0 Routine Hospital Admission (non-emergent condition) []  - 0 New Admissions / Manufacturing engineer / Ordering NPWT Apligraf, etc. , []  - 0 Emergency Hospital Admission (emergent condition) X- 1 10 Simple Discharge Coordination []  - 0 Complex (extensive) Discharge Coordination PROCESS - Special Needs []  - 0 Pediatric / Minor Patient Management []  - 0 Isolation Patient Management []  - 0 Hearing / Language /  Visual special needs []  - 0 Assessment of Community assistance (transportation, D/C planning, etc.) []  - 0 Additional assistance / Altered mentation []  - 0 Support Surface(s) Assessment (bed, cushion, seat, etc.) INTERVENTIONS -  Wound Cleansing / Measurement []  - 0 Simple Wound Cleansing - one wound X- 3 5 Complex Wound Cleansing - multiple wounds X- 1 5 Wound Imaging (photographs - any number of wounds) []  - 0 Wound Tracing (instead of photographs) []  - 0 Simple Wound Measurement - one wound X- 3 5 Complex Wound Measurement - multiple wounds INTERVENTIONS - Wound Dressings []  - 0 Small Wound Dressing one or multiple wounds X- 3 15 Medium Wound Dressing one or multiple wounds []  - 0 Large Wound Dressing one or multiple wounds []  - 0 Application of Medications - topical []  - 0 Application of Medications - injection INTERVENTIONS - Miscellaneous []  - 0 External ear exam [name] ([id number]) [id number] Page 3 of 11 []  - 0 Specimen Collection (cultures, biopsies, blood, body fluids, etc.) []  - 0 Specimen(s) / Culture(s) sent or taken to Lab for analysis []  - 0 Patient Transfer (multiple staff / Michiel Sites Lift / Similar devices) []  - 0 Simple Staple / Suture removal (25 or less) []  - 0 Complex Staple / Suture removal (26 or more) []  - 0 Hypo / Hyperglycemic Management (close monitor of Blood Glucose) []  - 0 Ankle / Brachial Index (ABI) - do not check if billed separately X- 1 5 Vital Signs Has the patient been seen at the hospital within the last three years: Yes Total Score: 145 Level Of Care: New/Established - Level 4 Electronic Signature(s) Signed: 05/31/2023 2:04:51 PM By: [name] Entered By: [name] on 05/27/2023 11:38:28 -------------------------------------------------------------------------------- Encounter Discharge Information Details Patient Name: Date of Service: [name], Matthew [name] 05/27/2023 10:45 A  M Medical Record Number: [id number] Patient Account Number: [id number] Date of Birth/Sex: Treating RN: September 06, 1955 (67 y.o. [name] Primary Care [name]: [name] Other Clinician: Betha [name] Referring [name]: Treating [name]/Extender: [name] in Treatment: 45 Encounter Discharge Information Items Discharge Condition: Stable Ambulatory Status: Cane Discharge Destination: Home Transportation: Private Auto Accompanied By: self Schedule Follow-up Appointment: Yes Clinical Summary of Care: Electronic Signature(s) Signed: 05/31/2023 2:04:51 PM By: [name] Entered By: [name] on 05/27/2023 11:55:24 Lower Extremity Assessment Details -------------------------------------------------------------------------------- [name] ([id number]) [id number] Page 4 of 11 Patient Name: Date of Service: [name] 05/27/2023 10:45 A M Medical Record Number: [id number] Patient Account Number: [id number] Date of Birth/Sex: Treating RN: 1955/09/24 (67 y.o. [name] Primary Care Yobana Culliton: [name] Other Clinician: Betha [name] Referring Blessin Kanno: Treating Joson Sapp/Extender: [name] in Treatment: 45 Edema Assessment Left: Right: Assessed: Yes Yes Edema: Yes Yes Calf Left: Right: Point of Measurement: 36 cm From Medial Instep 56.7 cm 62.4 cm Ankle Left: Right: Point of Measurement: 12 cm From Medial Instep 31 cm 31.4 cm Vascular Assessment Left: Right: Pulses: Dorsalis Pedis Palpable: Yes Yes Electronic Signature(s) Signed: 05/27/2023 4:31:35 PM By: Angelina Pih Signed: 05/31/2023 2:04:51 PM By: [name] Entered By: [name] on 05/27/2023 11:16:37 -------------------------------------------------------------------------------- Multi Wound Chart Details Patient Name: Date of Service: Matthew Burly [name] 05/27/2023 10:45 A  M Medical Record Number: [id number] Patient Account Number: [id number] Date of Birth/Sex: Treating RN: May 14, 1956 (67 y.o. [name] Primary Care Juanmanuel Marohl: [name] Other Clinician: Betha [name] Referring Karyme [name]: Treating Vearl Aitken/Extender: [name] in Treatment: 45 Vital Signs Height(in): 73 Pulse(bpm): 74 Weight(lbs): 358 Blood Pressure(mmHg): 123/77 Body Mass Index(BMI): 47.2 Temperature(F): 97.7 Respiratory Rate(breaths/min): 18 [3:Photos:] Right Lower Leg Left, Posterior Lower Leg Right, Distal, Lateral Lower Leg Wound Location: Haws, Bronsen J (  [id number]) [id number] Page 5 of 11 Gradually Appeared Blister Blister Wounding Event: Lymphedema Lymphedema Venous Leg Ulcer Primary Etiology: Arrhythmia, Congestive Heart Failure,Arrhythmia, Congestive Heart Failure, Arrhythmia, Congestive Heart Failure, Comorbid History: Hypertension, Type II Diabetes Hypertension, Type II Diabetes Hypertension, Type II Diabetes 02/04/2023 05/27/2023 05/27/2023 Date Acquired: 16 0 0 Weeks of Treatment: Open Open Open Wound Status: No No No Wound Recurrence: 1x1.5x0.1 2x2.4x0.1 0.5x1x0.1 Measurements L x W x D (cm) 1.178 3.77 0.393 A (cm) : rea 0.118 0.377 0.039 Volume (cm) : 3.80% N/A N/A % Reduction in Area: 51.80% N/A N/A % Reduction in Volume: Partial Thickness Full Thickness Without Exposed Full Thickness Without Exposed Classification: Support Structures Support Structures Medium Medium Medium Exudate Amount: Serosanguineous Serosanguineous Serosanguineous Exudate Type: red, brown red, brown red, brown Exudate Color: Large (67-100%) N/A N/A Granulation Amount: Red N/A N/A Granulation Quality: Small (1-33%) N/A N/A Necrotic Amount: Fat Layer (Subcutaneous Tissue): Yes N/A Fat Layer (Subcutaneous Tissue): Yes Exposed Structures: Fascia: No Fascia: No Tendon: No Tendon: No Muscle: No Muscle:  No Joint: No Joint: No Bone: No Bone: No Small (1-33%) None None Epithelialization: Treatment Notes Electronic Signature(s) Signed: 05/31/2023 2:04:51 PM By: [name] Entered By: [name] on 05/27/2023 11:16:42 -------------------------------------------------------------------------------- Multi-Disciplinary Care Plan Details Patient Name: Date of Service: Matthew Burly [name] 05/27/2023 10:45 A M Medical Record Number: [id number] Patient Account Number: [id number] Date of Birth/Sex: Treating RN: 1956-02-12 (67 y.o. [name] Primary Care Samier Jaco: [name] Other Clinician: Betha [name] Referring Derionna Salvador: Treating Arrin Ishler/Extender: [name] in Treatment: 45 Active Inactive Venous Leg Ulcer Nursing Diagnoses: Actual venous Insuffiency (use after diagnosis is confirmed) Goals: Patient will maintain optimal edema control Date Initiated: 10/15/2022 Target Resolution Date: 06/12/2023 Goal Status: Active Patient/caregiver will verbalize understanding of disease process and disease management Date Initiated: 10/15/2022 Date Inactivated: 11/12/2022 Target Resolution Date: 10/15/2022 Goal Status: Met Verify adequate tissue perfusion prior to therapeutic compression application Date Initiated: 10/15/2022 Date Inactivated: 11/12/2022 Target Resolution Date: 10/15/2022 Goal Status: Met JAKING, BRASINGTON ([id number]) [phone].pdf Page 6 of 11 Interventions: Assess peripheral edema status every visit. Compression as ordered Provide education on venous insufficiency Treatment Activities: Therapeutic compression applied : 10/15/2022 Notes: Electronic Signature(s) Signed: 05/27/2023 4:31:35 PM By: Angelina Pih Signed: 05/31/2023 2:04:51 PM By: [name] Entered By: [name] on 05/27/2023 11:38:40 -------------------------------------------------------------------------------- Pain Assessment  Details Patient Name: Date of Service: Matthew Burly [name] 05/27/2023 10:45 A M Medical Record Number: [id number] Patient Account Number: [id number] Date of Birth/Sex: Treating RN: [birth year] (67 y.o. [name] Primary Care Jamira Barfuss: [name] Other Clinician: Betha [name] Referring Edison Nicholson: Treating Dawson Hollman/Extender: [name] in Treatment: 45 Active Problems Location of Pain Severity and Description of Pain Patient Has Paino No Site Locations Pain Management and Medication Current Pain Management: Electronic Signature(s) Signed: 05/27/2023 4:31:35 PM By: Angelina Pih Signed: 05/31/2023 2:04:51 PM By: [name] Entered By: [name] on 05/27/2023 10:58:54 [name] ([id number]) [id number] Page 7 of 11 -------------------------------------------------------------------------------- Patient/Caregiver Education Details Patient Name: Date of Service: [name] 12/12/2024andnbsp10:45 A M Medical Record Number: [id number] Patient Account Number: [id number] Date of Birth/Gender: Treating RN: 19-Apr-1956 (67 y.o. [name] Primary Care Physician: [name] Other Clinician: Betha [name] Referring Physician: Treating Physician/Extender: [name] in Treatment: 45 Education Assessment Education Provided To: Patient Education Topics Provided Wound/Skin Impairment: Handouts: Other: continue wound care as directed Methods: Explain/Verbal Responses: State content correctly Electronic Signature(s) Signed: 05/31/2023 2:04:51  PM By: [name] Entered By: [name] on 05/27/2023 11:54:47 -------------------------------------------------------------------------------- Wound Assessment Details Patient Name: Date of Service: [name] 05/27/2023 10:45 A M Medical Record Number: [id number] Patient Account Number: [id number] Date of  Birth/Sex: Treating RN: 03-06-1956 (67 y.o. [name] Primary Care Ann Bohne: [name] Other Clinician: Betha [name] Referring Somnang Mahan: Treating Elpidia Karn/Extender: [name] in Treatment: 45 Wound Status Wound Number: 3 Primary Lymphedema Etiology: Wound Location: Right Lower Leg Wound Status: Open Wounding Event: Gradually Appeared Comorbid Arrhythmia, Congestive Heart Failure, Hypertension, Type II Date Acquired: 02/04/2023 History: Diabetes Weeks Of Treatment: 16 Clustered Wound: No Photos [name], [name] ([id number]) [phone].pdf Page 8 of 11 Wound Measurements Length: (cm) 1 Width: (cm) 1.5 Depth: (cm) 0.1 Area: (cm) 1.178 Volume: (cm) 0.118 % Reduction in Area: 3.8% % Reduction in Volume: 51.8% Epithelialization: Small (1-33%) Wound Description Classification: Partial Thickness Exudate Amount: Medium Exudate Type: Serosanguineous Exudate Color: red, brown Foul Odor After Cleansing: No Slough/Fibrino Yes Wound Bed Granulation Amount: Large (67-100%) Exposed Structure Granulation Quality: Red Fascia Exposed: No Necrotic Amount: Small (1-33%) Fat Layer (Subcutaneous Tissue) Exposed: Yes Necrotic Quality: Adherent Slough Tendon Exposed: No Muscle Exposed: No Joint Exposed: No Bone Exposed: No Treatment Notes Wound #3 (Lower Leg) Wound Laterality: Right Cleanser Soap and Water Discharge Instruction: Gently cleanse wound with antibacterial soap, rinse and pat dry prior to dressing wounds Peri-Wound Care Topical Primary Dressing Silvercel Small 2x2 (in/in) Discharge Instruction: Apply Silvercel Small 2x2 (in/in) as instructed Secondary Dressing ABD Pad 5x9 (in/in) Discharge Instruction: Cover with ABD pad Secured With Medipore T - 69M Medipore H Soft Cloth Surgical T ape ape, 2x2 (in/yd) Compression Wrap Compression Stockings Add-Ons Electronic Signature(s) Signed: 05/27/2023 4:31:35 PM  By: Angelina Pih Signed: 05/31/2023 2:04:51 PM By: [name] Entered By: [name] on 05/27/2023 11:13:01 [name] ([id number]) [id number] Page 9 of 11 -------------------------------------------------------------------------------- Wound Assessment Details Patient Name: Date of Service: [name] 05/27/2023 10:45 A M Medical Record Number: [id number] Patient Account Number: [id number] Date of Birth/Sex: Treating RN: 1956/03/06 (67 y.o. [name] Primary Care Jourdin Gens: [name] Other Clinician: Betha [name] Referring Madsen Riddle: Treating Emil Weigold/Extender: [name] in Treatment: 45 Wound Status Wound Number: 6 Primary Lymphedema Etiology: Wound Location: Left, Posterior Lower Leg Wound Status: Open Wounding Event: Blister Comorbid Arrhythmia, Congestive Heart Failure, Hypertension, Type II Date Acquired: 05/27/2023 History: Diabetes Weeks Of Treatment: 0 Clustered Wound: No Photos Wound Measurements Length: (cm) 2 Width: (cm) 2.4 Depth: (cm) 0.1 Area: (cm) 3.77 Volume: (cm) 0.377 % Reduction in Area: % Reduction in Volume: Epithelialization: None Wound Description Classification: Full Thickness Without Exposed Support Structures Exudate Amount: Medium Exudate Type: Serosanguineous Exudate Color: red, brown Foul Odor After Cleansing: No Slough/Fibrino Yes Treatment Notes Wound #6 (Lower Leg) Wound Laterality: Left, Posterior Cleanser Soap and Water Discharge Instruction: Gently cleanse wound with antibacterial soap, rinse and pat dry prior to dressing wounds Peri-Wound Care Topical Primary Dressing Silvercel Small 2x2 (in/in) Discharge Instruction: Apply Silvercel Small 2x2 (in/in) as instructed Secondary Dressing ABD Pad 5x9 (in/in) Discharge Instruction: Cover with ABD pad [name], [name] ([id number]) [id number] Page 10 of 11 Secured  With Medipore T - 69M Medipore H Soft Cloth Surgical T ape ape, 2x2 (in/yd) Compression Wrap Compression Stockings Add-Ons Electronic Signature(s) Signed: 05/27/2023 4:31:35 PM By: Angelina Pih Signed: 05/31/2023 2:04:51 PM By: [name] Entered By: [name] on 05/27/2023 11:13:52 -------------------------------------------------------------------------------- Wound Assessment Details Patient Name: Date of Service:  [name], Matthew [name] 05/27/2023 10:45 A M Medical Record Number: [id number] Patient Account Number: [id number] Date of Birth/Sex: Treating RN: Oct 19, 1955 (67 y.o. [name] Primary Care Shemia Bevel: [name] Other Clinician: Betha [name] Referring Etha Stambaugh: Treating Kaysi Ourada/Extender: [name] in Treatment: 45 Wound Status Wound Number: 7 Primary Venous Leg Ulcer Etiology: Wound Location: Right, Distal, Lateral Lower Leg Wound Status: Open Wounding Event: Blister Comorbid Arrhythmia, Congestive Heart Failure, Hypertension, Type II Date Acquired: 05/27/2023 History: Diabetes Weeks Of Treatment: 0 Clustered Wound: No Photos Wound Measurements Length: (cm) 0.5 Width: (cm) 1 Depth: (cm) 0.1 Area: (cm) 0.393 Volume: (cm) 0.039 % Reduction in Area: % Reduction in Volume: Epithelialization: None Wound Description Classification: Full Thickness Without Exposed Suppor Exudate Amount: Medium Exudate Type: Serosanguineous Exudate Color: red, brown t Structures Foul Odor After Cleansing: No Slough/Fibrino Yes Wound Bed Exposed [name], [name] ([id number]) [id number] Page 11 of 11 Fascia Exposed: No Fat Layer (Subcutaneous Tissue) Exposed: Yes Tendon Exposed: No Muscle Exposed: No Joint Exposed: No Bone Exposed: No Treatment Notes Wound #7 (Lower Leg) Wound Laterality: Right, Lateral, Distal Cleanser Soap and Water Discharge Instruction: Gently cleanse wound with  antibacterial soap, rinse and pat dry prior to dressing wounds Peri-Wound Care Topical Primary Dressing Silvercel Small 2x2 (in/in) Discharge Instruction: Apply Silvercel Small 2x2 (in/in) as instructed Secondary Dressing ABD Pad 5x9 (in/in) Discharge Instruction: Cover with ABD pad Secured With Medipore T - 73M Medipore H Soft Cloth Surgical T ape ape, 2x2 (in/yd) Compression Wrap Compression Stockings Add-Ons Electronic Signature(s) Signed: 05/27/2023 4:31:35 PM By: Angelina Pih Signed: 05/31/2023 2:04:51 PM By: [name] Entered By: [name] on 05/27/2023 11:14:34 -------------------------------------------------------------------------------- Vitals Details Patient Name: Date of Service: [name], Matthew [name] 05/27/2023 10:45 A M Medical Record Number: [id number] Patient Account Number: [id number] Date of Birth/Sex: Treating RN: 1955/11/09 (67 y.o. [name] Primary Care Damarion Mendizabal: [name] Other Clinician: Betha [name] Referring Alysabeth Scalia: Treating Dennison Mcdaid/Extender: [name] in Treatment: 45 Vital Signs Time Taken: 10:55 Temperature (F): 97.7 Height (in): 73 Pulse (bpm): 74 Weight (lbs): 358 Respiratory Rate (breaths/min): 18 Body Mass Index (BMI): 47.2 Blood Pressure (mmHg): 123/77 Reference Range: 80 - 120 mg / dl Electronic Signature(s) Signed: 05/31/2023 2:04:51 PM By: [name] Entered By: [name] on 05/27/2023 10:58:42

## 2023-06-03 ENCOUNTER — Encounter: Payer: PPO | Admitting: Physician Assistant

## 2023-06-03 DIAGNOSIS — I872 Venous insufficiency (chronic) (peripheral): Secondary | ICD-10-CM | POA: Diagnosis not present

## 2023-06-03 DIAGNOSIS — L97812 Non-pressure chronic ulcer of other part of right lower leg with fat layer exposed: Secondary | ICD-10-CM | POA: Diagnosis not present

## 2023-06-03 DIAGNOSIS — I890 Lymphedema, not elsewhere classified: Secondary | ICD-10-CM | POA: Diagnosis not present

## 2023-06-03 DIAGNOSIS — E11622 Type 2 diabetes mellitus with other skin ulcer: Secondary | ICD-10-CM | POA: Diagnosis not present

## 2023-06-04 NOTE — Progress Notes (Signed)
DANILO, MCGREGOR ([id number]) [id number] Page 1 of 11 Visit Report for 06/03/2023 Arrival Information Details Patient Name: Date of Service: [name] 06/03/2023 12:00 PM Medical Record Number: [id number] Patient Account Number: [id number] Date of Birth/Sex: Treating RN: 09-07-1955 (67 y.o. [name] Primary Care Dilana Mcphie: [name] Other Clinician: Referring Briante Loveall: Treating Fidela Cieslak/Extender: Matthew Folks in Treatment: 86 Visit Information History Since Last Visit All ordered tests and consults were completed: No Patient Arrived: Ambulatory Added or deleted any medications: No Arrival Time: 12:12 Any new allergies or adverse reactions: No Accompanied By: self Had a fall or experienced change in No Transfer Assistance: None activities of daily living that may affect Patient Identification Verified: Yes risk of falls: Secondary Verification Process Completed: Yes Signs or symptoms of abuse/neglect since last visito No Patient Requires Transmission-Based Precautions: No Hospitalized since last visit: No Patient Has Alerts: No Implantable device outside of the clinic excluding No cellular tissue based products placed in the center since last visit: Pain Present Now: No Electronic Signature(s) Signed: 06/03/2023 4:27:20 PM By: Angelina Pih Entered By: Angelina Pih on 06/03/2023 12:13:01 -------------------------------------------------------------------------------- Clinic Level of Care Assessment Details Patient Name: Date of Service: [name] 06/03/2023 12:00 PM Medical Record Number: [id number] Patient Account Number: [id number] Date of Birth/Sex: Treating RN: 18-Apr-1956 (67 y.o. [name] Primary Care Averee Harb: [name] Other Clinician: Referring Roshana Shuffield: Treating Mabeline Varas/Extender: Matthew Folks in Treatment: 46 Clinic Level of Care Assessment  Items TOOL 1 Quantity Score []  - 0 Use when EandM and Procedure is performed on INITIAL visit ASSESSMENTS - Nursing Assessment / Reassessment []  - 0 General Physical Exam (combine w/ comprehensive assessment (listed just below) when performed on new pt. evals) []  - 0 Comprehensive Assessment (HX, ROS, Risk Assessments, Wounds Hx, etc.) [name], [name] ([id number]) [id number] Page 2 of 11 ASSESSMENTS - Wound and Skin Assessment / Reassessment []  - 0 Dermatologic / Skin Assessment (not related to wound area) ASSESSMENTS - Ostomy and/or Continence Assessment and Care []  - 0 Incontinence Assessment and Management []  - 0 Ostomy Care Assessment and Management (repouching, etc.) PROCESS - Coordination of Care []  - 0 Simple Patient / Family Education for ongoing care []  - 0 Complex (extensive) Patient / Family Education for ongoing care []  - 0 Staff obtains Chiropractor, Records, T Results / Process Orders est []  - 0 Staff telephones HHA, Nursing Homes / Clarify orders / etc []  - 0 Routine Transfer to another Facility (non-emergent condition) []  - 0 Routine Hospital Admission (non-emergent condition) []  - 0 New Admissions / Manufacturing engineer / Ordering NPWT Apligraf, etc. , []  - 0 Emergency Hospital Admission (emergent condition) PROCESS - Special Needs []  - 0 Pediatric / Minor Patient Management []  - 0 Isolation Patient Management []  - 0 Hearing / Language / Visual special needs []  - 0 Assessment of Community assistance (transportation, D/C planning, etc.) []  - 0 Additional assistance / Altered mentation []  - 0 Support Surface(s) Assessment (bed, cushion, seat, etc.) INTERVENTIONS - Miscellaneous []  - 0 External ear exam []  - 0 Patient Transfer (multiple staff / Nurse, adult / Similar devices) []  - 0 Simple Staple / Suture removal (25 or less) []  - 0 Complex Staple / Suture removal (26 or more) []  - 0 Hypo/Hyperglycemic Management (do  not check if billed separately) []  - 0 Ankle / Brachial Index (ABI) - do not check if billed separately Has the patient been seen at the hospital within the  last three years: Yes Total Score: 0 Level Of Care: ____ Electronic Signature(s) Signed: 06/03/2023 4:27:20 PM By: Angelina Pih Entered By: Angelina Pih on 06/03/2023 13:36:35 -------------------------------------------------------------------------------- Encounter Discharge Information Details Patient Name: Date of Service: [name], Matthew [name] 06/03/2023 12:00 PM Medical Record Number: [id number] Patient Account Number: [id number] Date of Birth/Sex: Treating RN: 12/26/1955 (67 y.o. [name] Primary Care Geraldy Akridge: [name] Other Clinician: Referring Charlane Westry: Treating Jheri Mitter/Extender: Matthew Folks in Treatment: [address] [place] ([id number]) [id number] Page 3 of 11 Encounter Discharge Information Items Post Procedure Vitals Discharge Condition: Stable Temperature (F): 98.2 Ambulatory Status: Cane Pulse (bpm): 76 Discharge Destination: Home Respiratory Rate (breaths/min): 18 Transportation: Private Auto Blood Pressure (mmHg): 118/73 Accompanied By: self Schedule Follow-up Appointment: Yes Clinical Summary of Care: Electronic Signature(s) Signed: 06/03/2023 4:27:20 PM By: Angelina Pih Entered By: Angelina Pih on 06/03/2023 13:44:50 -------------------------------------------------------------------------------- Lower Extremity Assessment Details Patient Name: Date of Service: [name] 06/03/2023 12:00 PM Medical Record Number: [id number] Patient Account Number: [id number] Date of Birth/Sex: Treating RN: [birth year] (67 y.o. [name] Primary Care Jorryn Casagrande: [name] Other Clinician: Referring Daryl Quiros: Treating Vyncent Overby/Extender: Matthew Folks in Treatment: 46 Edema Assessment Assessed: [Left: No]  [Right: No] Edema: [Left: Yes] [Right: Yes] Calf Left: Right: Point of Measurement: 13 cm From Medial Instep 56.5 cm 66.8 cm Ankle Left: Right: Point of Measurement: 36 cm From Medial Instep 30 cm 30.4 cm Vascular Assessment Pulses: Dorsalis Pedis Palpable: [Left:Yes] [Right:Yes] Extremity colors, hair growth, and conditions: Hair Growth on Extremity: [Left:No] [Right:No] Temperature of Extremity: [Left:Warm < 3 seconds] [Right:Warm < 3 seconds] Toe Nail Assessment Left: Right: Thick: Yes Yes Discolored: Yes Yes Deformed: Yes Yes Improper Length and Hygiene: Yes Yes Electronic Signature(s) Signed: 06/03/2023 4:27:20 PM By: Angelina Pih Entered By: Angelina Pih on 06/03/2023 12:24:55 [name] ([id number]) [id number] Page 4 of 11 -------------------------------------------------------------------------------- Multi-Disciplinary Care Plan Details Patient Name: Date of Service: [name] 06/03/2023 12:00 PM Medical Record Number: [id number] Patient Account Number: [id number] Date of Birth/Sex: Treating RN: 01/13/1956 (67 y.o. [name] Primary Care Michol Emory: [name] Other Clinician: Referring Wilmore Holsomback: Treating Deondrick Searls/Extender: Matthew Folks in Treatment: 46 Active Inactive Venous Leg Ulcer Nursing Diagnoses: Actual venous Insuffiency (use after diagnosis is confirmed) Goals: Patient will maintain optimal edema control Date Initiated: 10/15/2022 Target Resolution Date: 06/12/2023 Goal Status: Active Patient/caregiver will verbalize understanding of disease process and disease management Date Initiated: 10/15/2022 Date Inactivated: 11/12/2022 Target Resolution Date: 10/15/2022 Goal Status: Met Verify adequate tissue perfusion prior to therapeutic compression application Date Initiated: 10/15/2022 Date Inactivated: 11/12/2022 Target Resolution Date: 10/15/2022 Goal Status:  Met Interventions: Assess peripheral edema status every visit. Compression as ordered Provide education on venous insufficiency Treatment Activities: Therapeutic compression applied : 10/15/2022 Notes: Electronic Signature(s) Signed: 06/03/2023 4:27:20 PM By: Angelina Pih Entered By: Angelina Pih on 06/03/2023 13:37:11 -------------------------------------------------------------------------------- Pain Assessment Details Patient Name: Date of Service: Matthew Burly [name] 06/03/2023 12:00 PM Medical Record Number: [id number] Patient Account Number: [id number] Date of Birth/Sex: Treating RN: 09-Apr-1956 (67 y.o. [name] Primary Care Courtney Bellizzi: [name] Other Clinician: Referring Ceylon Arenson: Treating Shamya Macfadden/Extender: Avedis, Callo ([id number]) [id number] Page 5 of 11 Weeks in Treatment: 46 Active Problems Location of Pain Severity and Description of Pain Patient Has Paino No Site Locations With Dressing Change: No Pain Management and Medication Current Pain Management: Electronic Signature(s) Signed: 06/03/2023 4:27:20 PM By: Angelina Pih  Entered By: Angelina Pih on 06/03/2023 12:13:45 -------------------------------------------------------------------------------- Patient/Caregiver Education Details Patient Name: Date of Service: [name] 12/19/2024andnbsp12:00 PM Medical Record Number: [id number] Patient Account Number: [id number] Date of Birth/Gender: Treating RN: Feb 25, 1956 (67 y.o. [name] Primary Care Physician: [name] Other Clinician: Referring Physician: Treating Physician/Extender: Matthew Folks in Treatment: 37 Education Assessment Education Provided To: Patient Education Topics Provided Wound/Skin Impairment: Handouts: Caring for Your Ulcer Methods: Explain/Verbal Responses: State content correctly Electronic Signature(s) Signed:  06/03/2023 4:27:20 PM By: Angelina Pih Entered By: Angelina Pih on 06/03/2023 13:37:34 [name] ([id number]) [id number] Page 6 of 11 -------------------------------------------------------------------------------- Wound Assessment Details Patient Name: Date of Service: [name] 06/03/2023 12:00 PM Medical Record Number: [id number] Patient Account Number: [id number] Date of Birth/Sex: Treating RN: [birth year] (67 y.o. [name] Primary Care Rani Idler: [name] Other Clinician: Referring Tarini Carrier: Treating Nou Chard/Extender: Matthew Folks in Treatment: 46 Wound Status Wound Number: 3 Primary Lymphedema Etiology: Wound Location: Right Lower Leg Wound Status: Open Wounding Event: Gradually Appeared Comorbid Arrhythmia, Congestive Heart Failure, Hypertension, Type II Date Acquired: 02/04/2023 History: Diabetes Weeks Of Treatment: 17 Clustered Wound: No Photos Wound Measurements Length: (cm) 1.1 Width: (cm) 1.2 Depth: (cm) 0.1 Area: (cm) 1.037 Volume: (cm) 0.104 % Reduction in Area: 15.3% % Reduction in Volume: 57.6% Epithelialization: Small (1-33%) Wound Description Classification: Partial Thickness Exudate Amount: Medium Exudate Type: Serosanguineous Exudate Color: red, brown Foul Odor After Cleansing: No Slough/Fibrino Yes Wound Bed Granulation Amount: Large (67-100%) Exposed Structure Granulation Quality: Red Fascia Exposed: No Necrotic Amount: Small (1-33%) Fat Layer (Subcutaneous Tissue) Exposed: Yes Necrotic Quality: Adherent Slough Tendon Exposed: No Muscle Exposed: No Joint Exposed: No Bone Exposed: No Treatment Notes Wound #3 (Lower Leg) Wound Laterality: Right Cleanser Soap and Water Discharge Instruction: Gently cleanse wound with antibacterial soap, rinse and pat dry prior to dressing wounds [name], [name] ([id number]) [id number] Page 7 of  11 Peri-Wound Care Topical Primary Dressing Silvercel Small 2x2 (in/in) Discharge Instruction: Apply Silvercel Small 2x2 (in/in) as instructed Secondary Dressing ABD Pad 5x9 (in/in) Discharge Instruction: Cover with ABD pad Secured With Compression Wrap Urgo K2, two layer compression system, large Compression Stockings Add-Ons Electronic Signature(s) Signed: 06/03/2023 4:27:20 PM By: Angelina Pih Entered By: Angelina Pih on 06/03/2023 12:20:51 -------------------------------------------------------------------------------- Wound Assessment Details Patient Name: Date of Service: Matthew Burly [name] 06/03/2023 12:00 PM Medical Record Number: [id number] Patient Account Number: [id number] Date of Birth/Sex: Treating RN: Oct 29, 1955 (67 y.o. [name] Primary Care Gianno Volner: [name] Other Clinician: Referring Alvia Tory: Treating Nakeyia Menden/Extender: Matthew Folks in Treatment: 46 Wound Status Wound Number: 6 Primary Lymphedema Etiology: Wound Location: Left, Posterior Lower Leg Wound Status: Open Wounding Event: Blister Comorbid Arrhythmia, Congestive Heart Failure, Hypertension, Type II Date Acquired: 05/27/2023 History: Diabetes Weeks Of Treatment: 1 Clustered Wound: No Photos Wound Measurements Length: (cm) 0.8 Width: (cm) 1 Depth: (cm) 0.1 Area: (cm) 0.628 Volume: (cm) 0.063 [place], Fishel J ([id number]) % Reduction in Area: 83.3% % Reduction in Volume: 83.3% Epithelialization: None [id number] Page 8 of 11 Wound Description Classification: Full Thickness Without Exposed Support Structures Exudate Amount: Medium Exudate Type: Serosanguineous Exudate Color: red, brown Foul Odor After Cleansing: No Slough/Fibrino Yes Treatment Notes Wound #6 (Lower Leg) Wound Laterality: Left, Posterior Cleanser Soap and Water Discharge Instruction: Gently cleanse wound with antibacterial soap, rinse and pat dry  prior to dressing wounds Peri-Wound Care Topical Primary Dressing Silvercel Small 2x2 (in/in) Discharge Instruction: Apply Silvercel  Small 2x2 (in/in) as instructed Secondary Dressing ABD Pad 5x9 (in/in) Discharge Instruction: Cover with ABD pad Secured With Compression Wrap Urgo K2, two layer compression system, large Compression Stockings Add-Ons Electronic Signature(s) Signed: 06/03/2023 4:27:20 PM By: Angelina Pih Entered By: Angelina Pih on 06/03/2023 12:21:28 -------------------------------------------------------------------------------- Wound Assessment Details Patient Name: Date of Service: Matthew Burly [name] 06/03/2023 12:00 PM Medical Record Number: [id number] Patient Account Number: [id number] Date of Birth/Sex: Treating RN: 12/04/1955 (66 y.o. [name] Primary Care Winston Sobczyk: [name] Other Clinician: Referring Paisley Grajeda: Treating Armas Mcbee/Extender: Matthew Folks in Treatment: 46 Wound Status Wound Number: 7 Primary Venous Leg Ulcer Etiology: Wound Location: Right, Distal, Lateral Lower Leg Wound Status: Open Wounding Event: Blister Comorbid Arrhythmia, Congestive Heart Failure, Hypertension, Type II Date Acquired: 05/27/2023 History: Diabetes Weeks Of Treatment: 1 Clustered Wound: No Photos [name], [name] ([id number]) [phone].pdf Page 9 of 11 Wound Measurements Length: (cm) 1 Width: (cm) 1 Depth: (cm) 0.1 Area: (cm) 0.785 Volume: (cm) 0.079 % Reduction in Area: -99.7% % Reduction in Volume: -102.6% Epithelialization: None Wound Description Classification: Full Thickness Without Exposed Suppor Exudate Amount: Medium Exudate Type: Serosanguineous Exudate Color: red, brown t Structures Foul Odor After Cleansing: No Slough/Fibrino Yes Wound Bed Exposed Structure Fascia Exposed: No Fat Layer (Subcutaneous Tissue) Exposed: Yes Tendon Exposed: No Muscle Exposed: No Joint Exposed:  No Bone Exposed: No Treatment Notes Wound #7 (Lower Leg) Wound Laterality: Right, Lateral, Distal Cleanser Soap and Water Discharge Instruction: Gently cleanse wound with antibacterial soap, rinse and pat dry prior to dressing wounds Peri-Wound Care Topical Primary Dressing Silvercel Small 2x2 (in/in) Discharge Instruction: Apply Silvercel Small 2x2 (in/in) as instructed Secondary Dressing ABD Pad 5x9 (in/in) Discharge Instruction: Cover with ABD pad Secured With Compression Wrap Urgo K2, two layer compression system, large Compression Stockings Add-Ons Electronic Signature(s) Signed: 06/03/2023 4:27:20 PM By: Angelina Pih Entered By: Angelina Pih on 06/03/2023 12:22:14 [name] ([id number]) [id number] Page 10 of 11 -------------------------------------------------------------------------------- Wound Assessment Details Patient Name: Date of Service: [name] 06/03/2023 12:00 PM Medical Record Number: [id number] Patient Account Number: [id number] Date of Birth/Sex: Treating RN: 03-28-1956 (67 y.o. [name] Primary Care Iyona Pehrson: [name] Other Clinician: Referring Sueo Cullen: Treating Celenia Hruska/Extender: Matthew Folks in Treatment: 46 Wound Status Wound Number: 8 Primary Skin Tear Etiology: Wound Location: Plantar Foot Wound Status: Open Wounding Event: Skin Tear/Laceration Comorbid Arrhythmia, Congestive Heart Failure, Hypertension, Type Date Acquired: 06/01/2023 History: II Diabetes Weeks Of Treatment: 0 Clustered Wound: No Photos Wound Measurements Length: (cm) Width: (cm) Depth: (cm) Area: (cm) Volume: (cm) 2.5 % Reduction in Area: 1.5 % Reduction in Volume: 0.1 2.945 0.295 Wound Description Classification: Partial Thickness Exudate Amount: Small Exudate Type: Serosanguineous Exudate Color: red, brown Foul Odor After Cleansing: No Slough/Fibrino No Wound  Bed Granulation Amount: Large (67-100%) Granulation Quality: Red, Pink Necrotic Amount: Small (1-33%) Treatment Notes Wound #8 (Foot) Wound Laterality: Plantar Cleanser Soap and Water Discharge Instruction: Gently cleanse wound with antibacterial soap, rinse and pat dry prior to dressing wounds Peri-Wound Care Topical Primary Dressing [name], [name] ([id number]) [id number] Page 11 of 11 Silvercel Small 2x2 (in/in) Discharge Instruction: Apply Silvercel Small 2x2 (in/in) as instructed Secondary Dressing ABD Pad 5x9 (in/in) Discharge Instruction: Cover with ABD pad Secured With Compression Wrap Urgo K2, two layer compression system, large Compression Stockings Add-Ons Electronic Signature(s) Signed: 06/03/2023 4:27:20 PM By: Angelina Pih Entered By: Angelina Pih on 06/03/2023 12:22:48 -------------------------------------------------------------------------------- Vitals Details Patient Name: Date of Service:  [name], Matthew [name] 06/03/2023 12:00 PM Medical Record Number: [id number] Patient Account Number: [id number] Date of Birth/Sex: Treating RN: 11/23/1955 (67 y.o. [name] Primary Care Theodis Kinsel: [name] Other Clinician: Referring Star Resler: Treating Preslea Rhodus/Extender: Matthew Folks in Treatment: 46 Vital Signs Time Taken: 12:10 Temperature (F): 98.2 Height (in): 73 Pulse (bpm): 76 Weight (lbs): 358 Respiratory Rate (breaths/min): 18 Body Mass Index (BMI): 47.2 Blood Pressure (mmHg): 118/73 Reference Range: 80 - 120 mg / dl Electronic Signature(s) Signed: 06/03/2023 4:27:20 PM By: Angelina Pih Entered By: Angelina Pih on 06/03/2023 12:13:29

## 2023-06-04 NOTE — Progress Notes (Signed)
[name] ([id number]) [id number] Page 1 of 12 Visit Report for 06/03/2023 Chief Complaint Document Details Patient Name: Date of Service: [name] 06/03/2023 12:00 PM Medical Record Number: [id number] Patient Account Number: [id number] Date of Birth/Sex: Treating RN: November 21, 1955 (67 y.o. [name] Primary Care Provider: [name] Other Clinician: Referring Provider: Treating Provider/Extender: [name] in Treatment: 46 Information Obtained from: Patient Chief Complaint Bilateral LE ulcers and lymphedema Electronic Signature(s) Signed: 06/03/2023 12:26:50 PM By: Allen Derry PA-C Entered By: Allen Derry on 06/03/2023 12:26:50 -------------------------------------------------------------------------------- Debridement Details Patient Name: Date of Service: Matthew Burly [name] 06/03/2023 12:00 PM Medical Record Number: [id number] Patient Account Number: [id number] Date of Birth/Sex: Treating RN: [birth year] (67 y.o. [name] Primary Care Provider: [name] Other Clinician: Referring Provider: Treating Provider/Extender: [name] in Treatment: 46 Debridement Performed for Assessment: Wound #3 Right Lower Leg Performed By: Physician Allen Derry, PA-C The following information was scribed by: Angelina Pih The information was scribed for: Allen Derry Debridement Type: Debridement Level of Consciousness (Pre-procedure): Awake and Alert Pre-procedure Verification/Time Out Yes - 12:30 Taken: Start Time: 12:30 Percent of Wound Bed Debrided: 100% [name] Area Debrided (cm): otal 1.04 Tissue and other material debrided: Viable, Non-Viable, Slough, Subcutaneous, Slough Level: Skin/Subcutaneous Tissue Debridement Description: Excisional Instrument: Curette Bleeding: Minimum Hemostasis Achieved: Pressure Response to Treatment: Procedure was tolerated well Level of  Consciousness [name], [name] ([id number]) [id number] Page 2 of 12 Level of Consciousness (Post- Awake and Alert procedure): Post Debridement Measurements of Total Wound Length: (cm) 1.1 Width: (cm) 1.2 Depth: (cm) 0.3 Volume: (cm) 0.311 Character of Wound/Ulcer Post Debridement: Stable Post Procedure Diagnosis Same as Pre-procedure Electronic Signature(s) Signed: 06/03/2023 4:27:20 PM By: Angelina Pih Entered By: Angelina Pih on 06/03/2023 12:33:57 -------------------------------------------------------------------------------- Debridement Details Patient Name: Date of Service: Matthew Burly [name] 06/03/2023 12:00 PM Medical Record Number: [id number] Patient Account Number: [id number] Date of Birth/Sex: Treating RN: Mar 19, 1956 (67 y.o. [name] Primary Care Provider: [name] Other Clinician: Referring Provider: Treating Provider/Extender: [name] in Treatment: 46 Debridement Performed for Assessment: Wound #7 Right,Distal,Lateral Lower Leg Performed By: Physician Allen Derry, PA-C Debridement Type: Debridement Severity of Tissue Pre Debridement: Fat layer exposed Level of Consciousness (Pre-procedure): Awake and Alert Pre-procedure Verification/Time Out Yes - 12:30 Taken: Start Time: 12:30 Percent of Wound Bed Debrided: 100% [name] Area Debrided (cm): otal 0.78 Tissue and other material debrided: Viable, Non-Viable, Slough, Subcutaneous, Slough Level: Skin/Subcutaneous Tissue Debridement Description: Excisional Instrument: Curette Bleeding: Minimum Hemostasis Achieved: Pressure Response to Treatment: Procedure was tolerated well Level of Consciousness (Post- Awake and Alert procedure): Post Debridement Measurements of Total Wound Length: (cm) 1 Width: (cm) 1 Depth: (cm) 0.1 Volume: (cm) 0.079 Character of Wound/Ulcer Post Debridement: Stable Severity of Tissue Post Debridement: Fat layer  exposed Post Procedure Diagnosis Same as Pre-procedure Electronic Signature(s) Signed: 06/03/2023 4:27:20 PM By: Angelina Pih Entered By: Angelina Pih on 06/03/2023 12:35:06 [name] ([id number]) [id number] Page 3 of 12 -------------------------------------------------------------------------------- HPI Details Patient Name: Date of Service: [name] 06/03/2023 12:00 PM Medical Record Number: [id number] Patient Account Number: [id number] Date of Birth/Sex: Treating RN: 24-Dec-1955 (67 y.o. [name] Primary Care Provider: [name] Other Clinician: Referring Provider: Treating Provider/Extender: [name] in Treatment: 46 History of Present Illness HPI Description: 07-16-2022 upon evaluation today patient appears to be doing poorly currently in regard to his his legs. The left  leg is actually worse than the right. This is a patient that is previously been seen in the [place] office and at that point was doing really quite well with compression wrapping and often alginate are either Hydrofera Blue dressings. Fortunately he has gone since November 2020 until just current with out having any issue or need for wound care services which is great news. Unfortunately he is here today because he is having some issues at this point. Patient does have a history of several medical conditions which include diabetes mellitus type 2, lymphedema, hypertension, congestive heart failure, and cardiac arrhythmia though is not sure that this is actually atrial fibrillation based on what he has been told. He does have lymphedema pumps but tells me he has not used them since the summer when he had to move to a remodel of his building and subsequently never unpacked them. 07-23-2022 upon evaluation today patient appears to be doing well with regard to his legs. He is going require some sharp debridement today but overall seems  to be making some progress. I am pleased in that regard he has much less drainage that he had did last week I think the compression wraps are definitely helping. 07-30-2022 upon evaluation today patient appears to be doing poorly in regard to his legs he actually has blue-green drainage which I think is consistent with Pseudomonas. I believe that we need to address this as soon as possible and I discussed that with the patient today as well. Fortunately I do not see any signs of active infection locally nor systemically at this time which is great news. No fevers, chills, nausea, vomiting, or diarrhea. 08-13-2022 upon evaluation today patient appears to be doing well currently in regard to his leg ulcers which are actually looking much better. Fortunately there does not appear to be any signs of active infection at this time which is great news. No fevers, chills, nausea, vomiting, or diarrhea. 3/7; patient with predominantly a left medial lower extremity wound as well as several smaller areas and a small area on the right lateral. His Jodie Echevaria came in today. We are using silver alginate as the primary dressing 09-03-2022 upon evaluation today patient appears to be doing well currently in regard to his wounds. He has been tolerating the dressing changes without complication. Fortunately there does not appear to be any signs of infection there is needed however for sharp debridement. 09-10-2022 upon evaluation today patient appears to be doing well currently in regard to his wound. Has been tolerating the dressing changes without complication. Fortunately there does not appear to be any signs of infection looking systemically which is great news and overall I am extremely pleased with where we stand today. I do not see any signs of infection. 09-17-2022 upon evaluation today patient actually is making signs of improvement the good news is he is making good improvement. With that being said he still is  having quite a time keeping the swelling down. We have been using Tubigrip I think that still probably our best option but nonetheless I think that we are making progress with regard to his wounds. 09-24-2022 upon evaluation today patient appears to be doing well currently in regard to his wounds is continuing to make improvements at this point which is great news and overall I am extremely pleased with where we stand today. Fortunately I do not see any evidence of active infection at this time which is great news and in general I think he is  doing quite well with the [facility] and silver alginate. 10-01-2022 upon evaluation today patient appears to be doing pretty well currently in regard to his wounds. He has been tolerating the dressing changes without complication. Fortunately I do not see any evidence of active infection locally nor systemically which is great news and I do believe that 10-08-2022 upon evaluation today patient's wounds actually are showing some signs of improvement which is great news. Still this is very slow to heal I really feel like he would do better if we can get him in some stronger compression wrapping he does have the juxta lite compression wraps therefore we can actually see about doing that over top of his Tubigrip to see if that can be beneficial. He does not love the hide he had as they are not the most comfortable thing for him but nonetheless I think it would be beneficial he is in agreement with going forward with this. 10-15-2022 upon evaluation today patient appears to be doing well currently in regard to his wounds that do not appear to be infected which is good news. With that being said unfortunately he still continues to have significant amount of swelling we really need to do something to try to get the swelling under control. I do not see any signs of infection locally nor systemically but at the same time the amount of edema is tremendous. The juxta lite helps but  again he is only taking the fluid pills once or twice a week due to the amount that makes him go to the restroom. Therefore he does not take this daily which is really what he needs on a more regular basis. We need to try to get some compression on and is a little bit stronger. 10-22-2022 upon evaluation today patient actually appears to be making some really good progress in regard to his wound. The compression wrap was put on last time actually doing a great job he looks much improved and in general I feel like that we are making great progress. I do not see any evidence of active infection locally nor systemically which is great news. 10-29-2022 upon evaluation today patient appears to be doing decently well in regard to his legs currently. I feel like that his swelling is much better I feel like that he is also doing much better in regard to the wounds in general. I do not see any signs of active infection which is great news and in general I think that he is making excellent progress towards complete resolution. The patient does seem to have 1 issue that is with his wraps actually slipping down working I definitely need to work on that aspect. ZAIYAN, [name] ([id number]) [id number] Page 4 of 12 11-05-2022 upon evaluation today patient appears to be doing excellent in regard to his wound. He has been tolerating the dressing changes without complication. He actually seems to be making excellent progress I am extremely pleased with where things stand currently. I do believe that we are really making good progress and I think that we are on the right track towards complete closure I think the compression wrap has been a dramatic shift in a good way for him as far as getting the wounds healed a lot of these areas that we will continue to leave Completely sealed up. 11-12-2022 upon evaluation today patient appears to be doing well currently in regard to his leg ulcer. The  compression wrapping seems to be doing an excellent job. Fortunately  I do not see any evidence of active infection locally nor systemically which is great news and in general I do believe that we are making good headway here towards complete closure. 11-19-2022 upon evaluation today patient appears to be doing well currently in regard to his wounds. He is actually showing signs of excellent improvement and very pleased with where we stand I think that he is making great progress. I do not see any evidence of infection at this time which is great news. 11-26-2022 upon evaluation today patient appears to be doing well currently in regard to his wound. He is actually been tolerating the dressing changes without complication. Fortunately I do not see any evidence of active infection locally nor systemically which is great news I think he is making excellent progress here towards closure. 12-03-2022 upon evaluation today patient appears to be doing well currently in regard to his wound. He in fact at both locations is doing great and seems to be making excellent progress and very pleased in that regard. I do not see any signs of active infection at this time. 12-10-2022 upon evaluation patient's wound is actually significantly smaller with one of the satellite lesions closed and there is just 1 small area remaining and this is actually doing quite well. I am very pleased with her progress. 12-15-2022 upon evaluation today patient appears to be doing actually pretty well in regard to his wound on the left leg. He does have a little area on the backside that open that was previously close last week due to the fact that his wrap got wet over the weekend and he had to make do with stuff they can find at the drugstore. They made this work out however and the good news is he actually seems to be doing significantly better and the wound looks fine except for the fact that he has a couple of areas that just reopen on the  backside very tiny. Nonetheless I do believe that we can go ahead and see what we do about trying to get this moving in the right direction yet again. 12-22-2022 upon evaluation today patient appears to be doing poorly in regard to his wrap slid down and his leg is extremely swollen. It is also painful secondary to it being so swollen. Fortunately I do not see any evidence of infection right now but at the same time I do believe that if we do not get the swelling under control is not really good to alleviate his discomfort. We definitely can need to change his wrap out however in short order. 12-31-2022 upon evaluation today patient appears to be doing well currently in regard to his wound. He has been tolerating the dressing changes without complication. This actually looks to be doing much better today compared to where we were. Fortunately I do not see any evidence of active infection locally or systemically which is great news. 01-11-2023 upon evaluation today patient appears to be doing well currently in regard to his wound which is actually showing signs of significant improvement. Fortunately I do not see any signs of infection locally or systemically which is great news and in general I do believe that we are making good headway towards complete closure. 01-19-2023 upon evaluation today patient appears to be doing well currently hemoglobin to his wound. He has been tolerating the dressing changes without complication. Fortunately the wound is not infected unfortunately it is a little bigger due to the fact that the wrap slid down.  I do not see any signs of worsening or infection at this time. 01-28-2023 upon evaluation today patient appears to be doing well currently in regard to his wound. He has been tolerating the dressing changes without complication. Fortunately I do not see any signs of infection the compression wrap is doing a really good job and I think it worked pretty much just  about healed. 02-04-2023 upon evaluation today patient appears to be doing excellent in regard to his wounds. In fact he is pretty much completely healed on the left although I think this may have just a pinpoint opening. Nonetheless I think it is 1 more week to toughen up. 02-16-2023 upon evaluation today patient appears to be doing well currently in regard to his wound. He has been tolerating the dressing changes without complication. Fortunately there does not appear to be any evidence of active infection locally or systemically which is good news. In fact the original wounds we been taking care of on the left and right legs are completely healed unfortunately his wrap slipped down the right leg and there are 2 small areas on the anterior portion of his leg which are open at this point. When I wrapped his right leg the left leg is completely closed however he is in agreement to his own compression sock. 02-26-2023 upon evaluation today patient appears to be doing well currently in regard to his left leg which is still close in the right leg is doing significantly better. Fortunately there does not appear to be any signs of active infection locally or systemically which is great news and in general I do believe that we will making headway towards complete closure. 03-05-2023 upon evaluation today patient actually appears to be doing excellent in regard to his wounds one of the areas healed the other is significantly improved. I am actually very pleased with where we stand today and I think that he is doing well with his compression stockings which is excellent as well. 03-19-2023 upon evaluation today patient appears to be doing poorly currently in regard to his wounds. He seems to be doing a bit worse as far as both legs are concerned as far as open wounds and I think that we are going need to address this rapidly to keep it from backtracking and getting significantly worse. He voiced understanding he  kind of somewhat expected I believe. 03-29-2023 upon evaluation patient actually showing signs of definite improvement with regard to his wounds currently with some already drying up and on the right lateral leg this is much smaller. Will continue to wrap him this seems to be doing quite well. 04-06-2023 upon evaluation today patient's wounds on the legs actually appear to be doing better which is great news. Fortunately I do not see any evidence of worsening overall and I do believe that the patient is making good headway towards closure which is good news. I do not see any signs of infection which is also excellent. He actually only had the wraps on for a couple of days before they started slipping down he had cut them off and therefore spent the remaining 5 days pretty much just in his compression socks he seems of done very well though. 04-13-2023 upon evaluation patient is down to just 1 wound on the right leg everything appears to be doing excellent I think he is making excellent progress towards complete closure. Fortunately I do not see any signs of active infection locally or systemically which is great news.  04-20-2023 upon evaluation today patient appears to be doing well currently in regard to his wound. He has been tolerating the dressing changes without complication. Fortunately there does not appear to be any signs of active infection at this time. No fevers, chills, nausea, vomiting, or diarrhea. 04-27-2023 upon evaluation today patient appears to be doing a little worse with some irritation around the sides of his wounds. I am not sure if he has been wearing his compression all the time but he does tell me which I Cellerate well that his dressings were not quite in place and therefore there is more drainage and irritation I think due to the fact that he had accidentally put it in the wrong location on his right leg. Nonetheless the wound does not Bigger but this just some irritation of  the skin around. 05-04-2023 upon evaluation today patient's wounds are actually showing some signs of improvement here which is great news. Fortunately I do not see any signs of worsening overall and I believe that he is making good headway here towards closure which is great news. 05-27-2023 upon evaluation today patient appears to be doing little better in regard to his wounds. Fortunately I do not see any signs of active infection at this time. He is using his own compression stockings and he has no new skin tear/injury from tape he is doing much better in my opinion. [name], [name] ([id number]) [id number] Page 5 of 12 06-03-2023 upon evaluation today patient appears to be doing poorly currently in regard to his legs. He has been tolerating the dressing changes without complication. Fortunately I do not see any signs of active infection at this time although he unfortunately is also not healing very well he still has a lot of swelling. We have been trying to have him use his own compression socks and avoid putting back in the compression wrapping but I think at this point with her need to go back to the compression wrapping. Electronic Signature(s) Signed: 06/03/2023 1:43:48 PM By: Allen Derry PA-C Entered By: Allen Derry on 06/03/2023 13:43:48 -------------------------------------------------------------------------------- Physical Exam Details Patient Name: Date of Service: [name], Matthew [name] 06/03/2023 12:00 PM Medical Record Number: [id number] Patient Account Number: [id number] Date of Birth/Sex: Treating RN: Aug 08, 1955 (66 y.o. [name] Primary Care Provider: [name] Other Clinician: Referring Provider: Treating Provider/Extender: [name] in Treatment: 66 Constitutional Well-nourished and well-hydrated in no acute distress. Respiratory normal breathing without difficulty. Psychiatric this patient is able to  make decisions and demonstrates good insight into disease process. Alert and Oriented x 3. pleasant and cooperative. Notes Upon inspection patient's wound bed actually did require sharp debridement on the 2 wounds on the right leg the left leg did not require debridement today. Fortunately I do not see any signs of active infection at this time which is great news and in general I believe that we can headway here towards closure which is excellent as well. Electronic Signature(s) Signed: 06/03/2023 1:44:09 PM By: Allen Derry PA-C Entered By: Allen Derry on 06/03/2023 13:44:09 -------------------------------------------------------------------------------- Physician Orders Details Patient Name: Date of Service: [name], Matthew [name] 06/03/2023 12:00 PM Medical Record Number: [id number] Patient Account Number: [id number] Date of Birth/Sex: Treating RN: [birth year] (67 y.o. [name] Primary Care Provider: [name] Other Clinician: Referring Provider: Treating Provider/Extender: [name] in Treatment: 46 The following information was scribed by: Angelina Pih The information was scribed for: [name], [name] ([id number])  [id number] Page 6 of 12 Verbal / Phone Orders: No Diagnosis Coding ICD-10 Coding Code Description E11.622 Type 2 diabetes mellitus with other skin ulcer I89.0 Lymphedema, not elsewhere classified L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 L97.812 Non-pressure chronic ulcer of other part of right lower leg with fat layer exposed I10 Essential (primary) hypertension I50.42 Chronic combined systolic (congestive) and diastolic (congestive) heart failure I49.9 Cardiac arrhythmia, unspecified Follow-up Appointments Return Appointment in 1 week. Nurse Visit as needed - Nurse visit Monday (12/23) Friday (12/27) Monday (12/30) Thursday (1/2) Bathing/  Shower/ Hygiene May shower with wound dressing protected with water repellent cover or cast protector. Anesthetic (Use 'Patient Medications' Section for Anesthetic Order Entry) Lidocaine applied to wound bed Edema Control - Orders / Instructions Elevate, Exercise Daily and A void Standing for Long Periods of Time. Elevate legs to the level of the heart and pump ankles as often as possible Elevate leg(s) parallel to the floor when sitting. DO YOUR BEST to sleep in the bed at night. DO NOT sleep in your recliner. Long hours of sitting in a recliner leads to swelling of the legs and/or potential wounds on your backside. Wound Treatment Wound #3 - Lower Leg Wound Laterality: Right Cleanser: Soap and Water 2 x Per Week/30 Days Discharge Instructions: Gently cleanse wound with antibacterial soap, rinse and pat dry prior to dressing wounds Prim Dressing: Silvercel Small 2x2 (in/in) 2 x Per Week/30 Days ary Discharge Instructions: Apply Silvercel Small 2x2 (in/in) as instructed Secondary Dressing: ABD Pad 5x9 (in/in) 2 x Per Week/30 Days Discharge Instructions: Cover with ABD pad Compression Wrap: Urgo K2, two layer compression system, large 2 x Per Week/30 Days Wound #6 - Lower Leg Wound Laterality: Left, Posterior Cleanser: Soap and Water 2 x Per Week/30 Days Discharge Instructions: Gently cleanse wound with antibacterial soap, rinse and pat dry prior to dressing wounds Prim Dressing: Silvercel Small 2x2 (in/in) 2 x Per Week/30 Days ary Discharge Instructions: Apply Silvercel Small 2x2 (in/in) as instructed Secondary Dressing: ABD Pad 5x9 (in/in) 2 x Per Week/30 Days Discharge Instructions: Cover with ABD pad Compression Wrap: Urgo K2, two layer compression system, large 2 x Per Week/30 Days Wound #7 - Lower Leg Wound Laterality: Right, Lateral, Distal Cleanser: Soap and Water 2 x Per Week/30 Days Discharge Instructions: Gently cleanse wound with antibacterial soap, rinse and pat dry prior  to dressing wounds Prim Dressing: Silvercel Small 2x2 (in/in) 2 x Per Week/30 Days ary Discharge Instructions: Apply Silvercel Small 2x2 (in/in) as instructed Secondary Dressing: ABD Pad 5x9 (in/in) 2 x Per Week/30 Days Discharge Instructions: Cover with ABD pad Compression Wrap: Urgo K2, two layer compression system, large 2 x Per Week/30 Days Wound #8 - Foot Wound Laterality: Plantar Cleanser: Soap and Water 2 x Per Week/30 Days Discharge Instructions: Gently cleanse wound with antibacterial soap, rinse and pat dry prior to dressing wounds Prim Dressing: Silvercel Small 2x2 (in/in) 2 x Per Week/30 Days ary Discharge Instructions: Apply Silvercel Small 2x2 (in/in) as instructed [name], [name] ([id number]) [id number] Page 7 of 12 Secondary Dressing: ABD Pad 5x9 (in/in) 2 x Per Week/30 Days Discharge Instructions: Cover with ABD pad Compression Wrap: Urgo K2, two layer compression system, large 2 x Per Week/30 Days Electronic Signature(s) Signed: 06/03/2023 4:27:20 PM By: Angelina Pih Entered By: Angelina Pih on 06/03/2023 12:54:41 -------------------------------------------------------------------------------- Problem List Details Patient Name: Date of Service: Matthew Burly [name] 06/03/2023 12:00 PM Medical Record Number: [id number] Patient Account  Number: [id number] Date of Birth/Sex: Treating RN: 1955/09/07 (67 y.o. [name] Primary Care Provider: [name] Other Clinician: Referring Provider: Treating Provider/Extender: [name] in Treatment: 46 Active Problems ICD-10 Encounter Code Description Active Date MDM Diagnosis E11.622 Type 2 diabetes mellitus with other skin ulcer 07/16/2022 No Yes I89.0 Lymphedema, not elsewhere classified 07/16/2022 No Yes L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2/06/2022 No Yes L97.812 Non-pressure chronic ulcer of other part of  right lower leg with fat layer 07/16/2022 No Yes exposed I10 Essential (primary) hypertension 07/16/2022 No Yes I50.42 Chronic combined systolic (congestive) and diastolic (congestive) heart failure 07/16/2022 No Yes I49.9 Cardiac arrhythmia, unspecified 07/16/2022 No Yes Inactive Problems Resolved Problems AERYK, MCIRVIN ([id number]) [phone].pdf Page 8 of 12 Electronic Signature(s) Signed: 06/03/2023 12:26:47 PM By: Allen Derry PA-C Entered By: Allen Derry on 06/03/2023 12:26:47 -------------------------------------------------------------------------------- Progress Note Details Patient Name: Date of Service: [name], Matthew [name] 06/03/2023 12:00 PM Medical Record Number: [id number] Patient Account Number: [id number] Date of Birth/Sex: Treating RN: December 06, 1955 (67 y.o. [name] Primary Care Provider: [name] Other Clinician: Referring Provider: Treating Provider/Extender: [name] in Treatment: 46 Subjective Chief Complaint Information obtained from Patient Bilateral LE ulcers and lymphedema History of Present Illness (HPI) 07-16-2022 upon evaluation today patient appears to be doing poorly currently in regard to his his legs. The left leg is actually worse than the right. This is a patient that is previously been seen in the [facility] office and at that point was doing really quite well with compression wrapping and often alginate are either Hydrofera Blue dressings. Fortunately he has gone since November 2020 until just current with out having any issue or need for wound care services which is great news. Unfortunately he is here today because he is having some issues at this point. Patient does have a history of several medical conditions which include diabetes mellitus type 2, lymphedema, hypertension, congestive heart failure, and cardiac arrhythmia though is not sure that this is actually atrial fibrillation based on  what he has been told. He does have lymphedema pumps but tells me he has not used them since the summer when he had to move to a remodel of his building and subsequently never unpacked them. 07-23-2022 upon evaluation today patient appears to be doing well with regard to his legs. He is going require some sharp debridement today but overall seems to be making some progress. I am pleased in that regard he has much less drainage that he had did last week I think the compression wraps are definitely helping. 07-30-2022 upon evaluation today patient appears to be doing poorly in regard to his legs he actually has blue-green drainage which I think is consistent with Pseudomonas. I believe that we need to address this as soon as possible and I discussed that with the patient today as well. Fortunately I do not see any signs of active infection locally nor systemically at this time which is great news. No fevers, chills, nausea, vomiting, or diarrhea. 08-13-2022 upon evaluation today patient appears to be doing well currently in regard to his leg ulcers which are actually looking much better. Fortunately there does not appear to be any signs of active infection at this time which is great news. No fevers, chills, nausea, vomiting, or diarrhea. 3/7; patient with predominantly a left medial lower extremity wound as well as several smaller areas and a small area on  the right lateral. His Jodie Echevaria came in today. We are using silver alginate as the primary dressing 09-03-2022 upon evaluation today patient appears to be doing well currently in regard to his wounds. He has been tolerating the dressing changes without complication. Fortunately there does not appear to be any signs of infection there is needed however for sharp debridement. 09-10-2022 upon evaluation today patient appears to be doing well currently in regard to his wound. Has been tolerating the dressing changes without complication. Fortunately there does  not appear to be any signs of infection looking systemically which is great news and overall I am extremely pleased with where we stand today. I do not see any signs of infection. 09-17-2022 upon evaluation today patient actually is making signs of improvement the good news is he is making good improvement. With that being said he still is having quite a time keeping the swelling down. We have been using Tubigrip I think that still probably our best option but nonetheless I think that we are making progress with regard to his wounds. 09-24-2022 upon evaluation today patient appears to be doing well currently in regard to his wounds is continuing to make improvements at this point which is great news and overall I am extremely pleased with where we stand today. Fortunately I do not see any evidence of active infection at this time which is great news and in general I think he is doing quite well with the [facility] and silver alginate. 10-01-2022 upon evaluation today patient appears to be doing pretty well currently in regard to his wounds. He has been tolerating the dressing changes without complication. Fortunately I do not see any evidence of active infection locally nor systemically which is great news and I do believe that 10-08-2022 upon evaluation today patient's wounds actually are showing some signs of improvement which is great news. Still this is very slow to heal I really feel like he would do better if we can get him in some stronger compression wrapping he does have the juxta lite compression wraps therefore we can actually see about doing that over top of his Tubigrip to see if that can be beneficial. He does not love the hide he had as they are not the most comfortable thing for him but nonetheless I think it would be beneficial he is in agreement with going forward with this. 10-15-2022 upon evaluation today patient appears to be doing well currently in regard to his wounds that do not appear  to be infected which is good news. With that being said unfortunately he still continues to have significant amount of swelling we really need to do something to try to get the swelling under control. I do not see any signs of infection locally nor systemically but at the same time the amount of edema is tremendous. The juxta lite helps but again he is only taking the fluid pills once or twice a week due to the amount that makes him go to the restroom. Therefore he does not take this daily which is really what he BRACE, LETOURNEAU ([id number]) [id number] Page 9 of 12 needs on a more regular basis. We need to try to get some compression on and is a little bit stronger. 10-22-2022 upon evaluation today patient actually appears to be making some really good progress in regard to his wound. The compression wrap was put on last time actually doing a great job he looks much improved and in general I feel like that  we are making great progress. I do not see any evidence of active infection locally nor systemically which is great news. 10-29-2022 upon evaluation today patient appears to be doing decently well in regard to his legs currently. I feel like that his swelling is much better I feel like that he is also doing much better in regard to the wounds in general. I do not see any signs of active infection which is great news and in general I think that he is making excellent progress towards complete resolution. The patient does seem to have 1 issue that is with his wraps actually slipping down working I definitely need to work on that aspect. 11-05-2022 upon evaluation today patient appears to be doing excellent in regard to his wound. He has been tolerating the dressing changes without complication. He actually seems to be making excellent progress I am extremely pleased with where things stand currently. I do believe that we are really making good progress and I think that we are  on the right track towards complete closure I think the compression wrap has been a dramatic shift in a good way for him as far as getting the wounds healed a lot of these areas that we will continue to leave Completely sealed up. 11-12-2022 upon evaluation today patient appears to be doing well currently in regard to his leg ulcer. The compression wrapping seems to be doing an excellent job. Fortunately I do not see any evidence of active infection locally nor systemically which is great news and in general I do believe that we are making good headway here towards complete closure. 11-19-2022 upon evaluation today patient appears to be doing well currently in regard to his wounds. He is actually showing signs of excellent improvement and very pleased with where we stand I think that he is making great progress. I do not see any evidence of infection at this time which is great news. 11-26-2022 upon evaluation today patient appears to be doing well currently in regard to his wound. He is actually been tolerating the dressing changes without complication. Fortunately I do not see any evidence of active infection locally nor systemically which is great news I think he is making excellent progress here towards closure. 12-03-2022 upon evaluation today patient appears to be doing well currently in regard to his wound. He in fact at both locations is doing great and seems to be making excellent progress and very pleased in that regard. I do not see any signs of active infection at this time. 12-10-2022 upon evaluation patient's wound is actually significantly smaller with one of the satellite lesions closed and there is just 1 small area remaining and this is actually doing quite well. I am very pleased with her progress. 12-15-2022 upon evaluation today patient appears to be doing actually pretty well in regard to his wound on the left leg. He does have a little area on the backside that open that was previously  close last week due to the fact that his wrap got wet over the weekend and he had to make do with stuff they can find at the drugstore. They made this work out however and the good news is he actually seems to be doing significantly better and the wound looks fine except for the fact that he has a couple of areas that just reopen on the backside very tiny. Nonetheless I do believe that we can go ahead and see what we do about trying to get this  moving in the right direction yet again. 12-22-2022 upon evaluation today patient appears to be doing poorly in regard to his wrap slid down and his leg is extremely swollen. It is also painful secondary to it being so swollen. Fortunately I do not see any evidence of infection right now but at the same time I do believe that if we do not get the swelling under control is not really good to alleviate his discomfort. We definitely can need to change his wrap out however in short order. 12-31-2022 upon evaluation today patient appears to be doing well currently in regard to his wound. He has been tolerating the dressing changes without complication. This actually looks to be doing much better today compared to where we were. Fortunately I do not see any evidence of active infection locally or systemically which is great news. 01-11-2023 upon evaluation today patient appears to be doing well currently in regard to his wound which is actually showing signs of significant improvement. Fortunately I do not see any signs of infection locally or systemically which is great news and in general I do believe that we are making good headway towards complete closure. 01-19-2023 upon evaluation today patient appears to be doing well currently hemoglobin to his wound. He has been tolerating the dressing changes without complication. Fortunately the wound is not infected unfortunately it is a little bigger due to the fact that the wrap slid down. I do not see any signs of  worsening or infection at this time. 01-28-2023 upon evaluation today patient appears to be doing well currently in regard to his wound. He has been tolerating the dressing changes without complication. Fortunately I do not see any signs of infection the compression wrap is doing a really good job and I think it worked pretty much just about healed. 02-04-2023 upon evaluation today patient appears to be doing excellent in regard to his wounds. In fact he is pretty much completely healed on the left although I think this may have just a pinpoint opening. Nonetheless I think it is 1 more week to toughen up. 02-16-2023 upon evaluation today patient appears to be doing well currently in regard to his wound. He has been tolerating the dressing changes without complication. Fortunately there does not appear to be any evidence of active infection locally or systemically which is good news. In fact the original wounds we been taking care of on the left and right legs are completely healed unfortunately his wrap slipped down the right leg and there are 2 small areas on the anterior portion of his leg which are open at this point. When I wrapped his right leg the left leg is completely closed however he is in agreement to his own compression sock. 02-26-2023 upon evaluation today patient appears to be doing well currently in regard to his left leg which is still close in the right leg is doing significantly better. Fortunately there does not appear to be any signs of active infection locally or systemically which is great news and in general I do believe that we will making headway towards complete closure. 03-05-2023 upon evaluation today patient actually appears to be doing excellent in regard to his wounds one of the areas healed the other is significantly improved. I am actually very pleased with where we stand today and I think that he is doing well with his compression stockings which is excellent as  well. 03-19-2023 upon evaluation today patient appears to be doing poorly currently in  regard to his wounds. He seems to be doing a bit worse as far as both legs are concerned as far as open wounds and I think that we are going need to address this rapidly to keep it from backtracking and getting significantly worse. He voiced understanding he kind of somewhat expected I believe. 03-29-2023 upon evaluation patient actually showing signs of definite improvement with regard to his wounds currently with some already drying up and on the right lateral leg this is much smaller. Will continue to wrap him this seems to be doing quite well. 04-06-2023 upon evaluation today patient's wounds on the legs actually appear to be doing better which is great news. Fortunately I do not see any evidence of worsening overall and I do believe that the patient is making good headway towards closure which is good news. I do not see any signs of infection which is also excellent. He actually only had the wraps on for a couple of days before they started slipping down he had cut them off and therefore spent the remaining 5 days pretty much just in his compression socks he seems of done very well though. 04-13-2023 upon evaluation patient is down to just 1 wound on the right leg everything appears to be doing excellent I think he is making excellent progress towards complete closure. Fortunately I do not see any signs of active infection locally or systemically which is great news. 04-20-2023 upon evaluation today patient appears to be doing well currently in regard to his wound. He has been tolerating the dressing changes without complication. Fortunately there does not appear to be any signs of active infection at this time. No fevers, chills, nausea, vomiting, or diarrhea. ALGIRDAS, SOINE ([id number]) [id number] Page 10 of 12 04-27-2023 upon evaluation today patient appears to be doing a little  worse with some irritation around the sides of his wounds. I am not sure if he has been wearing his compression all the time but he does tell me which I Cellerate well that his dressings were not quite in place and therefore there is more drainage and irritation I think due to the fact that he had accidentally put it in the wrong location on his right leg. Nonetheless the wound does not Bigger but this just some irritation of the skin around. 05-04-2023 upon evaluation today patient's wounds are actually showing some signs of improvement here which is great news. Fortunately I do not see any signs of worsening overall and I believe that he is making good headway here towards closure which is great news. 05-27-2023 upon evaluation today patient appears to be doing little better in regard to his wounds. Fortunately I do not see any signs of active infection at this time. He is using his own compression stockings and he has no new skin tear/injury from tape he is doing much better in my opinion. 06-03-2023 upon evaluation today patient appears to be doing poorly currently in regard to his legs. He has been tolerating the dressing changes without complication. Fortunately I do not see any signs of active infection at this time although he unfortunately is also not healing very well he still has a lot of swelling. We have been trying to have him use his own compression socks and avoid putting back in the compression wrapping but I think at this point with her need to go back to the compression wrapping. Objective Constitutional Well-nourished and well-hydrated in no acute distress. Vitals Time Taken: 12:10 PM, Height:  73 in, Weight: 358 lbs, BMI: 47.2, Temperature: 98.2 F, Pulse: 76 bpm, Respiratory Rate: 18 breaths/min, Blood Pressure: 118/73 mmHg. Respiratory normal breathing without difficulty. Psychiatric this patient is able to make decisions and demonstrates good insight into disease process.  Alert and Oriented x 3. pleasant and cooperative. General Notes: Upon inspection patient's wound bed actually did require sharp debridement on the 2 wounds on the right leg the left leg did not require debridement today. Fortunately I do not see any signs of active infection at this time which is great news and in general I believe that we can headway here towards closure which is excellent as well. Integumentary (Hair, Skin) Wound #3 status is Open. Original cause of wound was Gradually Appeared. The date acquired was: 02/04/2023. The wound has been in treatment 17 weeks. The wound is located on the Right Lower Leg. The wound measures 1.1cm length x 1.2cm width x 0.1cm depth; 1.037cm^2 area and 0.104cm^3 volume. There is Fat Layer (Subcutaneous Tissue) exposed. There is a medium amount of serosanguineous drainage noted. There is large (67-100%) red granulation within the wound bed. There is a small (1-33%) amount of necrotic tissue within the wound bed including Adherent Slough. Wound #6 status is Open. Original cause of wound was Blister. The date acquired was: 05/27/2023. The wound has been in treatment 1 weeks. The wound is located on the Left,Posterior Lower Leg. The wound measures 0.8cm length x 1cm width x 0.1cm depth; 0.628cm^2 area and 0.063cm^3 volume. There is a medium amount of serosanguineous drainage noted. Wound #7 status is Open. Original cause of wound was Blister. The date acquired was: 05/27/2023. The wound has been in treatment 1 weeks. The wound is located on the Right,Distal,Lateral Lower Leg. The wound measures 1cm length x 1cm width x 0.1cm depth; 0.785cm^2 area and 0.079cm^3 volume. There is Fat Layer (Subcutaneous Tissue) exposed. There is a medium amount of serosanguineous drainage noted. Wound #8 status is Open. Original cause of wound was Skin [name] ear/Laceration. The date acquired was: 06/01/2023. The wound is located on the Plantar Foot. The wound measures 2.5cm length x  1.5cm width x 0.1cm depth; 2.945cm^2 area and 0.295cm^3 volume. There is a small amount of serosanguineous drainage noted. There is large (67-100%) red, pink granulation within the wound bed. There is a small (1-33%) amount of necrotic tissue within the wound bed. Assessment Active Problems ICD-10 Type 2 diabetes mellitus with other skin ulcer Lymphedema, not elsewhere classified Non-pressure chronic ulcer of other part of left lower leg with fat layer exposed Non-pressure chronic ulcer of other part of right lower leg with fat layer exposed Essential (primary) hypertension Chronic combined systolic (congestive) and diastolic (congestive) heart failure Cardiac arrhythmia, unspecified Procedures Wound #3 Pre-procedure diagnosis of Wound #3 is a Lymphedema located on the Right Lower Leg . There was a Excisional Skin/Subcutaneous Tissue Debridement with a KELLIE, ATHAN ([id number]) [id number].pdf Page 11 of 12 total area of 1.04 sq cm performed by Allen Derry, PA-C. With the following instrument(s): Curette to remove Viable and Non-Viable tissue/material. Material removed includes Subcutaneous Tissue and Slough and. No specimens were taken. A time out was conducted at 12:30, prior to the start of the procedure. A Minimum amount of bleeding was controlled with Pressure. The procedure was tolerated well. Post Debridement Measurements: 1.1cm length x 1.2cm width x 0.3cm depth; 0.311cm^3 volume. Character of Wound/Ulcer Post Debridement is stable. Post procedure Diagnosis Wound #3: Same as Pre-Procedure Wound #7 Pre-procedure diagnosis of Wound #7 is a  Venous Leg Ulcer located on the Right,Distal,Lateral Lower Leg .Severity of Tissue Pre Debridement is: Fat layer exposed. There was a Excisional Skin/Subcutaneous Tissue Debridement with a total area of 0.78 sq cm performed by Allen Derry, PA-C. With the following instrument(s): Curette to remove Viable and Non-Viable  tissue/material. Material removed includes Subcutaneous Tissue and Slough and. No specimens were taken. A time out was conducted at 12:30, prior to the start of the procedure. A Minimum amount of bleeding was controlled with Pressure. The procedure was tolerated well. Post Debridement Measurements: 1cm length x 1cm width x 0.1cm depth; 0.079cm^3 volume. Character of Wound/Ulcer Post Debridement is stable. Severity of Tissue Post Debridement is: Fat layer exposed. Post procedure Diagnosis Wound #7: Same as Pre-Procedure Plan Follow-up Appointments: Return Appointment in 1 week. Nurse Visit as needed - Nurse visit Monday (12/23) Friday (12/27) Monday (12/30) Thursday (1/2) Bathing/ Shower/ Hygiene: May shower with wound dressing protected with water repellent cover or cast protector. Anesthetic (Use 'Patient Medications' Section for Anesthetic Order Entry): Lidocaine applied to wound bed Edema Control - Orders / Instructions: Elevate, Exercise Daily and Avoid Standing for Long Periods of Time. Elevate legs to the level of the heart and pump ankles as often as possible Elevate leg(s) parallel to the floor when sitting. DO YOUR BEST to sleep in the bed at night. DO NOT sleep in your recliner. Long hours of sitting in a recliner leads to swelling of the legs and/or potential wounds on your backside. WOUND #3: - Lower Leg Wound Laterality: Right Cleanser: Soap and Water 2 x Per Week/30 Days Discharge Instructions: Gently cleanse wound with antibacterial soap, rinse and pat dry prior to dressing wounds Prim Dressing: Silvercel Small 2x2 (in/in) 2 x Per Week/30 Days ary Discharge Instructions: Apply Silvercel Small 2x2 (in/in) as instructed Secondary Dressing: ABD Pad 5x9 (in/in) 2 x Per Week/30 Days Discharge Instructions: Cover with ABD pad Com pression Wrap: Urgo K2, two layer compression system, large 2 x Per Week/30 Days WOUND #6: - Lower Leg Wound Laterality: Left, Posterior Cleanser: Soap  and Water 2 x Per Week/30 Days Discharge Instructions: Gently cleanse wound with antibacterial soap, rinse and pat dry prior to dressing wounds Prim Dressing: Silvercel Small 2x2 (in/in) 2 x Per Week/30 Days ary Discharge Instructions: Apply Silvercel Small 2x2 (in/in) as instructed Secondary Dressing: ABD Pad 5x9 (in/in) 2 x Per Week/30 Days Discharge Instructions: Cover with ABD pad Com pression Wrap: Urgo K2, two layer compression system, large 2 x Per Week/30 Days WOUND #7: - Lower Leg Wound Laterality: Right, Lateral, Distal Cleanser: Soap and Water 2 x Per Week/30 Days Discharge Instructions: Gently cleanse wound with antibacterial soap, rinse and pat dry prior to dressing wounds Prim Dressing: Silvercel Small 2x2 (in/in) 2 x Per Week/30 Days ary Discharge Instructions: Apply Silvercel Small 2x2 (in/in) as instructed Secondary Dressing: ABD Pad 5x9 (in/in) 2 x Per Week/30 Days Discharge Instructions: Cover with ABD pad Com pression Wrap: Urgo K2, two layer compression system, large 2 x Per Week/30 Days WOUND #8: - Foot Wound Laterality: Plantar Cleanser: Soap and Water 2 x Per Week/30 Days Discharge Instructions: Gently cleanse wound with antibacterial soap, rinse and pat dry prior to dressing wounds Prim Dressing: Silvercel Small 2x2 (in/in) 2 x Per Week/30 Days ary Discharge Instructions: Apply Silvercel Small 2x2 (in/in) as instructed Secondary Dressing: ABD Pad 5x9 (in/in) 2 x Per Week/30 Days Discharge Instructions: Cover with ABD pad Com pression Wrap: Urgo K2, two layer compression system, large 2 x Per  Week/30 Days 1. I would recommend based on what we are seeing that we have the patient going to continue to monitor for any signs of infection or worsening. I do believe based on what I see there were making really good headway here towards getting this healed although I think that compression wrapping is probably in the interval with any to get that done at this point. 2. I  am going to recommend that we should continue with the silver alginate dressing as well as the ABD pad to cover. 3. We will use the Urgo K2 compression wraps. We will see patient back for reevaluation in 1 week here in the clinic. If anything worsens or changes patient will contact our office for additional recommendations. Electronic Signature(s) Signed: 06/03/2023 1:44:53 PM By: Allen Derry PA-C Entered By: Allen Derry on 06/03/2023 13:44:53 Trautman, Beatrix Shipper ([id number]) [id number] Page 12 of 12 -------------------------------------------------------------------------------- SuperBill Details Patient Name: Date of Service: [name] 06/03/2023 Medical Record Number: [id number] Patient Account Number: [id number] Date of Birth/Sex: Treating RN: 09-28-1955 (67 y.o. [name] Primary Care Provider: [name] Other Clinician: Referring Provider: Treating Provider/Extender: [name] in Treatment: 46 Diagnosis Coding ICD-10 Codes Code Description E11.622 Type 2 diabetes mellitus with other skin ulcer I89.0 Lymphedema, not elsewhere classified L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 L97.812 Non-pressure chronic ulcer of other part of right lower leg with fat layer exposed I10 Essential (primary) hypertension I50.42 Chronic combined systolic (congestive) and diastolic (congestive) heart failure I49.9 Cardiac arrhythmia, unspecified Facility Procedures : CPT4 Code: [id number] Description: 11042 - DEB SUBQ TISSUE 20 SQ CM/< ICD-10 Diagnosis Description L97.812 Non-pressure chronic ulcer of other part of right lower leg with fat layer exp Modifier: osed Quantity: 1 Physician Procedures : CPT4 Code Description Modifier 11042 11042 - WC PHYS SUBQ TISS 20 SQ CM ICD-10 Diagnosis Description L97.812 Non-pressure chronic ulcer of other part of right lower leg with fat layer  exposed Quantity: 1 Electronic Signature(s) Signed: 06/03/2023 1:45:07 PM By: Allen Derry PA-C Entered By: Allen Derry on 06/03/2023 13:45:07

## 2023-06-07 ENCOUNTER — Ambulatory Visit: Payer: PPO

## 2023-06-10 ENCOUNTER — Ambulatory Visit: Payer: PPO

## 2023-06-14 ENCOUNTER — Ambulatory Visit: Payer: PPO | Admitting: Physician Assistant

## 2023-06-14 DIAGNOSIS — E11622 Type 2 diabetes mellitus with other skin ulcer: Secondary | ICD-10-CM | POA: Diagnosis not present

## 2023-06-14 NOTE — Progress Notes (Signed)
EVAN, OSEN ([id number]) [id number] Page 1 of 3 Visit Report for 06/14/2023 Physician Orders Details Patient Name: Date of Service: [name] 06/14/2023 11:00 A M Medical Record Number: [id number] Patient Account Number: [id number] Date of Birth/Sex: Treating RN: 1956/03/17 (67 y.o. [name] Primary Care Provider: [name] Other Clinician: Referring Provider: Treating Provider/Extender: RO BSO Dorris Carnes, MICHA EL Geanie Logan in Treatment: 29 The following information was scribed by: Midge Aver The information was scribed for: Maxwell Caul Verbal / Phone Orders: No Diagnosis Coding Follow-up Appointments Return Appointment in 1 week. Nurse Visit as needed - Nurse visit Monday (12/23) Friday (12/27) Monday (12/30) Thursday (1/2) Bathing/ Shower/ Hygiene May shower with wound dressing protected with water repellent cover or cast protector. Anesthetic (Use 'Patient Medications' Section for Anesthetic Order Entry) Lidocaine applied to wound bed Edema Control - Orders / Instructions Elevate, Exercise Daily and A void Standing for Long Periods of Time. Elevate legs to the level of the heart and pump ankles as often as possible Elevate leg(s) parallel to the floor when sitting. DO YOUR BEST to sleep in the bed at night. DO NOT sleep in your recliner. Long hours of sitting in a recliner leads to swelling of the legs and/or potential wounds on your backside. Wound Treatment Wound #3 - Lower Leg Wound Laterality: Right Cleanser: Soap and Water 2 x Per Week/30 Days Discharge Instructions: Gently cleanse wound with antibacterial soap, rinse and pat dry prior to dressing wounds Prim Dressing: Silvercel Small 2x2 (in/in) 2 x Per Week/30 Days ary Discharge Instructions: Apply Silvercel Small 2x2 (in/in) as instructed Secondary Dressing: ABD Pad 5x9 (in/in) 2 x Per Week/30 Days Discharge Instructions: Cover with ABD pad Compression  Wrap: Urgo K2, two layer compression system, large 2 x Per Week/30 Days Wound #6 - Lower Leg Wound Laterality: Left, Posterior Cleanser: Soap and Water 2 x Per Week/30 Days Discharge Instructions: Gently cleanse wound with antibacterial soap, rinse and pat dry prior to dressing wounds Prim Dressing: Silvercel Small 2x2 (in/in) 2 x Per Week/30 Days ary Discharge Instructions: Apply Silvercel Small 2x2 (in/in) as instructed Secondary Dressing: ABD Pad 5x9 (in/in) 2 x Per Week/30 Days Discharge Instructions: Cover with ABD pad Compression Wrap: Urgo K2, two layer compression system, large 2 x Per Week/30 Days Wound #7 - Lower Leg Wound Laterality: Right, Lateral, Distal Cleanser: Soap and Water 2 x Per Week/30 Days Discharge Instructions: Gently cleanse wound with antibacterial soap, rinse and pat dry prior to dressing wounds HAMAD, VANDUYNE ([id number]) [id number] Page 2 of 3 Prim Dressing: Silvercel Small 2x2 (in/in) 2 x Per Week/30 Days ary Discharge Instructions: Apply Silvercel Small 2x2 (in/in) as instructed Secondary Dressing: ABD Pad 5x9 (in/in) 2 x Per Week/30 Days Discharge Instructions: Cover with ABD pad Compression Wrap: Urgo K2, two layer compression system, large 2 x Per Week/30 Days Wound #8 - Foot Wound Laterality: Plantar Cleanser: Soap and Water 2 x Per Week/30 Days Discharge Instructions: Gently cleanse wound with antibacterial soap, rinse and pat dry prior to dressing wounds Prim Dressing: Silvercel Small 2x2 (in/in) 2 x Per Week/30 Days ary Discharge Instructions: Apply Silvercel Small 2x2 (in/in) as instructed Secondary Dressing: ABD Pad 5x9 (in/in) 2 x Per Week/30 Days Discharge Instructions: Cover with ABD pad Compression Wrap: Urgo K2, two layer compression system, large 2 x Per Week/30 Days Notes Patient came in without wraps, stated they started falling the next day, so he cut them off. He had compression stockings from  the knee to  the ankle. Today I cleaned the wounds, redressed them and placed the compression stockings back on with Tubi D from the ankle to just below the toes. Patient refused the wraps. Electronic Signature(s) Signed: 06/14/2023 1:58:59 PM By: Midge Aver MSN RN CNS WTA Signed: 06/14/2023 4:31:08 PM By: Baltazar Najjar MD Entered By: Midge Aver on 06/14/2023 10:58:59 -------------------------------------------------------------------------------- SuperBill Details Patient Name: Date of Service: [name] 06/14/2023 Medical Record Number: [id number] Patient Account Number: [id number] Date of Birth/Sex: Treating RN: 09-13-1955 (67 y.o. 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

## 2023-06-14 NOTE — Progress Notes (Signed)
[name], [name] ([id number]) [id number] Page 1 of 8 Visit Report for 06/14/2023 Arrival Information Details Patient Name: Date of Service: [name] 06/14/2023 11:00 A M Medical Record Number: [id number] Patient Account Number: [id number] Date of Birth/Sex: Treating RN: 12-13-1955 (67 y.o. [name] Primary Care Maitland Lesiak: [name] Other Clinician: Referring [name]: Treating Kaydra Borgen/Extender: RO BSO N, [name] in Treatment: 59 Visit Information History Since Last Visit Added or deleted any medications: No Patient Arrived: Ambulatory Any new allergies or adverse reactions: No Arrival Time: 11:45 Had a fall or experienced change in No Accompanied By: self activities of daily living that may affect Transfer Assistance: None risk of falls: Patient Identification Verified: Yes Hospitalized since last visit: No Secondary Verification Process Completed: Yes Has Dressing in Place as Prescribed: Yes Patient Requires Transmission-Based Precautions: No Has Compression in Place as Prescribed: No Patient Has Alerts: No Pain Present Now: No Electronic Signature(s) Signed: 06/14/2023 1:55:35 PM By: Midge Aver MSN RN CNS WTA Entered By: Midge Aver on 06/14/2023 13:55:34 -------------------------------------------------------------------------------- Clinic Level of Care Assessment Details Patient Name: Date of Service: [name] 06/14/2023 11:00 A M Medical Record Number: [id number] Patient Account Number: [id number] Date of Birth/Sex: Treating RN: March 20, 1956 (67 y.o. [name] Primary Care Nastasia Kage: [name] Other Clinician: Referring Daquon Greenleaf: Treating Emonee Winkowski/Extender: RO BSO N, [name] in Treatment: 47 Clinic Level of Care Assessment Items TOOL 4 Quantity Score X- 1 0 Use when only an EandM is performed on FOLLOW-UP visit ASSESSMENTS - Nursing Assessment /  Reassessment X- 1 10 Reassessment of Co-morbidities (includes updates in patient status) X- 1 5 Reassessment of Adherence to Treatment Plan ASSESSMENTS - Wound and Skin A ssessment / Reassessment []  - 0 Simple Wound Assessment / Reassessment - one wound [name], [name] ([id number]) [phone].pdf Page 2 of 8 X- 4 5 Complex Wound Assessment / Reassessment - multiple wounds []  - 0 Dermatologic / Skin Assessment (not related to wound area) ASSESSMENTS - Focused Assessment []  - 0 Circumferential Edema Measurements - multi extremities []  - 0 Nutritional Assessment / Counseling / Intervention []  - 0 Lower Extremity Assessment (monofilament, tuning fork, pulses) []  - 0 Peripheral Arterial Disease Assessment (using hand held doppler) ASSESSMENTS - Ostomy and/or Continence Assessment and Care []  - 0 Incontinence Assessment and Management []  - 0 Ostomy Care Assessment and Management (repouching, etc.) PROCESS - Coordination of Care []  - 0 Simple Patient / Family Education for ongoing care X- 1 20 Complex (extensive) Patient / Family Education for ongoing care X- 1 10 Staff obtains Chiropractor, Records, T Results / Process Orders est []  - 0 Staff telephones HHA, Nursing Homes / Clarify orders / etc []  - 0 Routine Transfer to another Facility (non-emergent condition) []  - 0 Routine Hospital Admission (non-emergent condition) []  - 0 New Admissions / Manufacturing engineer / Ordering NPWT Apligraf, etc. , []  - 0 Emergency Hospital Admission (emergent condition) X- 1 10 Simple Discharge Coordination []  - 0 Complex (extensive) Discharge Coordination PROCESS - Special Needs []  - 0 Pediatric / Minor Patient Management []  - 0 Isolation Patient Management []  - 0 Hearing / Language / Visual special needs []  - 0 Assessment of Community assistance (transportation, D/C planning, etc.) []  - 0 Additional assistance / Altered mentation []  - 0 Support  Surface(s) Assessment (bed, cushion, seat, etc.) INTERVENTIONS - Wound Cleansing / Measurement []  - 0 Simple Wound Cleansing - one wound X- 4 5 Complex Wound  Cleansing - multiple wounds []  - 0 Wound Imaging (photographs - any number of wounds) []  - 0 Wound Tracing (instead of photographs) []  - 0 Simple Wound Measurement - one wound []  - 0 Complex Wound Measurement - multiple wounds INTERVENTIONS - Wound Dressings X - Small Wound Dressing one or multiple wounds 4 10 []  - 0 Medium Wound Dressing one or multiple wounds []  - 0 Large Wound Dressing one or multiple wounds []  - 0 Application of Medications - topical []  - 0 Application of Medications - injection INTERVENTIONS - Miscellaneous []  - 0 External ear exam []  - 0 Specimen Collection (cultures, biopsies, blood, body fluids, etc.) []  - 0 Specimen(s) / Culture(s) sent or taken to Lab for analysis [name], [name] ([id number]) [phone].pdf Page 3 of 8 []  - 0 Patient Transfer (multiple staff / Nurse, adult / Similar devices) []  - 0 Simple Staple / Suture removal (25 or less) []  - 0 Complex Staple / Suture removal (26 or more) []  - 0 Hypo / Hyperglycemic Management (close monitor of Blood Glucose) []  - 0 Ankle / Brachial Index (ABI) - do not check if billed separately []  - 0 Vital Signs Has the patient been seen at the hospital within the last three years: Yes Total Score: 135 Level Of Care: New/Established - Level 4 Electronic Signature(s) Unsigned Entered By: Midge Aver on 06/14/2023 14:00:57 -------------------------------------------------------------------------------- Encounter Discharge Information Details Patient Name: Date of Service: [name] 06/14/2023 11:00 A M Medical Record Number: [id number] Patient Account Number: [id number] Date of Birth/Sex: Treating RN: Apr 05, 1956 (67 y.o. [name] Primary Care Teona Vargus: [name] Other Clinician: Referring  [name]: Treating Aeron Lheureux/Extender: RO BSO Dorris Carnes, [name] in Treatment: 73 Encounter Discharge Information Items Discharge Condition: Stable Ambulatory Status: Ambulatory Discharge Destination: Home Transportation: Private Auto Accompanied By: self Schedule Follow-up Appointment: Yes Clinical Summary of Care: Electronic Signature(s) Signed: 06/14/2023 1:59:24 PM By: Midge Aver MSN RN CNS WTA Entered By: Midge Aver on 06/14/2023 13:59:24 -------------------------------------------------------------------------------- Wound Assessment Details Patient Name: Date of Service: Matthew Burly [name] 06/14/2023 11:00 A M Medical Record Number: [id number] Patient Account Number: [id number] [name], [name] ([id number]) [phone].pdf Page 4 of 8 Date of Birth/Sex: Treating RN: [birth year] (67 y.o. [name] Primary Care Linzey Ramser: Other Clinician: [name] Referring Zandyr Barnhill: Treating Kaiah Hosea/Extender: [name] Mann, [name] in Treatment: 47 Wound Status Wound Number: 3 Primary Etiology: Lymphedema Wound Location: Right Lower Leg Wound Status: Open Wounding Event: Gradually Appeared Date Acquired: 02/04/2023 Weeks Of Treatment: 18 Clustered Wound: No Wound Measurements Length: (cm) 1.1 Width: (cm) 1.2 Depth: (cm) 0.1 Area: (cm) 1.037 Volume: (cm) 0.104 % Reduction in Area: 15.3% % Reduction in Volume: 57.6% Wound Description Classification: Exudate Amount: Exudate Type: Exudate Color: Partial Thickness Medium Serosanguineous red, brown Treatment Notes Wound #3 (Lower Leg) Wound Laterality: Right Cleanser Soap and Water Discharge Instruction: Gently cleanse wound with antibacterial soap, rinse and pat dry prior to dressing wounds Peri-Wound Care Topical Primary Dressing Silvercel Small 2x2 (in/in) Discharge Instruction: Apply Silvercel Small 2x2 (in/in) as instructed Secondary Dressing ABD  Pad 5x9 (in/in) Discharge Instruction: Cover with ABD pad Secured With Compression Wrap Urgo K2, two layer compression system, large Compression Stockings Add-Ons Electronic Signature(s) Unsigned Entered By: Midge Aver on 06/14/2023 13:56:17 Signature(s): [name] ([id number]) [id number] Date(s): [id number] Page 5 of 8 -------------------------------------------------------------------------------- Wound Assessment Details Patient Name: Date of Service: Matthew Burly [name] 06/14/2023 11:00 A M Medical  Record Number: [id number] Patient Account Number: [id number] Date of Birth/Sex: Treating RN: 1955-10-21 (67 y.o. [name] Primary Care Daphine Loch: [name] Other Clinician: Referring Letita Prentiss: Treating Daila Elbert/Extender: RO BSO N, [name] in Treatment: 47 Wound Status Wound Number: 6 Primary Etiology: Lymphedema Wound Location: Left, Posterior Lower Leg Wound Status: Open Wounding Event: Blister Date Acquired: 05/27/2023 Weeks Of Treatment: 2 Clustered Wound: No Wound Measurements Length: (cm) 0.8 Width: (cm) 1 Depth: (cm) 0.1 Area: (cm) 0.628 Volume: (cm) 0.063 % Reduction in Area: 83.3% % Reduction in Volume: 83.3% Wound Description Classification: Full Thickness Without Exposed Supp Exudate Amount: Medium Exudate Type: Serosanguineous Exudate Color: red, brown ort Structures Treatment Notes Wound #6 (Lower Leg) Wound Laterality: Left, Posterior Cleanser Soap and Water Discharge Instruction: Gently cleanse wound with antibacterial soap, rinse and pat dry prior to dressing wounds Peri-Wound Care Topical Primary Dressing Silvercel Small 2x2 (in/in) Discharge Instruction: Apply Silvercel Small 2x2 (in/in) as instructed Secondary Dressing ABD Pad 5x9 (in/in) Discharge Instruction: Cover with ABD pad Secured With Compression Wrap Urgo K2, two layer compression system, large Compression  Stockings Add-Ons Electronic Signature(s) Unsigned Entered By: Midge Aver on 06/14/2023 13:56:18 Signature(s): [name] ([id number]) [id number] Date(s): [id number] Page 6 of 8 -------------------------------------------------------------------------------- Wound Assessment Details Patient Name: Date of Service: [name] 06/14/2023 11:00 A M Medical Record Number: [id number] Patient Account Number: [id number] Date of Birth/Sex: Treating RN: 03-May-1956 (67 y.o. [name] Primary Care Doreen Garretson: [name] Other Clinician: Referring Pranit Owensby: Treating [name]/Extender: RO BSO N, [name] in Treatment: 47 Wound Status Wound Number: 7 Primary Etiology: Venous Leg Ulcer Wound Location: Right, Distal, Lateral Lower Leg Wound Status: Open Wounding Event: Blister Date Acquired: 05/27/2023 Weeks Of Treatment: 2 Clustered Wound: No Wound Measurements Length: (cm) 1 Width: (cm) 1 Depth: (cm) 0.1 Area: (cm) 0.785 Volume: (cm) 0.079 % Reduction in Area: -99.7% % Reduction in Volume: -102.6% Wound Description Classification: Full Thickness Without Exposed Supp Exudate Amount: Medium Exudate Type: Serosanguineous Exudate Color: red, brown ort Structures Treatment Notes Wound #7 (Lower Leg) Wound Laterality: Right, Lateral, Distal Cleanser Soap and Water Discharge Instruction: Gently cleanse wound with antibacterial soap, rinse and pat dry prior to dressing wounds Peri-Wound Care Topical Primary Dressing Silvercel Small 2x2 (in/in) Discharge Instruction: Apply Silvercel Small 2x2 (in/in) as instructed Secondary Dressing ABD Pad 5x9 (in/in) Discharge Instruction: Cover with ABD pad Secured With Compression Wrap Urgo K2, two layer compression system, large Compression Stockings Add-Ons Electronic Signature(s) Unsigned Entered By: Midge Aver on 06/14/2023 13:56:18 Signature(s): [name]  ([id number]) [id number] Date(s): [id number].pdf Page 7 of 8 -------------------------------------------------------------------------------- Wound Assessment Details Patient Name: Date of Service: [name] 06/14/2023 11:00 A M Medical Record Number: [id number] Patient Account Number: [id number] Date of Birth/Sex: Treating RN: Nov 21, 1955 (67 y.o. [name] Primary Care Kaj Vasil: [name] Other Clinician: Referring Qadir Folks: Treating [name]/Extender: RO BSO N, [name] in Treatment: 47 Wound Status Wound Number: 8 Primary Etiology: Skin Tear Wound Location: Plantar Foot Wound Status: Open Wounding Event: Skin Tear/Laceration Date Acquired: 06/01/2023 Weeks Of Treatment: 1 Clustered Wound: No Wound Measurements Length: (cm) 2.5 Width: (cm) 1.5 Depth: (cm) 0.1 Area: (cm) 2.945 Volume: (cm) 0.295 % Reduction in Area: 0% % Reduction in Volume: 0% Wound Description Classification: Partial Thickness Exudate Amount: Small Exudate Type: Serosanguineous Exudate Color: red, brown Treatment Notes Wound #8 (Foot) Wound Laterality: Plantar Cleanser Soap and Water Discharge Instruction: Gently  cleanse wound with antibacterial soap, rinse and pat dry prior to dressing wounds Peri-Wound Care Topical Primary Dressing Silvercel Small 2x2 (in/in) Discharge Instruction: Apply Silvercel Small 2x2 (in/in) as instructed Secondary Dressing ABD Pad 5x9 (in/in) Discharge Instruction: Cover with ABD pad Secured With Compression Wrap Urgo K2, two layer compression system, large Compression Stockings Add-Ons Electronic Signature(s) CALVION, DELEE ([id number]) [id number] Page 8 of 8 Entered By: Midge Aver on 06/14/2023 13:56:18 Signature(s): Date(s):

## 2023-06-17 ENCOUNTER — Ambulatory Visit: Payer: PPO | Admitting: Physician Assistant

## 2023-06-18 ENCOUNTER — Encounter: Payer: PPO | Attending: Physician Assistant | Admitting: Physician Assistant

## 2023-06-18 DIAGNOSIS — I890 Lymphedema, not elsewhere classified: Secondary | ICD-10-CM | POA: Insufficient documentation

## 2023-06-18 DIAGNOSIS — I5042 Chronic combined systolic (congestive) and diastolic (congestive) heart failure: Secondary | ICD-10-CM | POA: Insufficient documentation

## 2023-06-18 DIAGNOSIS — L97812 Non-pressure chronic ulcer of other part of right lower leg with fat layer exposed: Secondary | ICD-10-CM | POA: Diagnosis not present

## 2023-06-18 DIAGNOSIS — L97822 Non-pressure chronic ulcer of other part of left lower leg with fat layer exposed: Secondary | ICD-10-CM | POA: Insufficient documentation

## 2023-06-18 DIAGNOSIS — I4891 Unspecified atrial fibrillation: Secondary | ICD-10-CM | POA: Diagnosis not present

## 2023-06-18 DIAGNOSIS — E11622 Type 2 diabetes mellitus with other skin ulcer: Secondary | ICD-10-CM | POA: Insufficient documentation

## 2023-06-18 DIAGNOSIS — I110 Hypertensive heart disease with heart failure: Secondary | ICD-10-CM | POA: Insufficient documentation

## 2023-06-18 NOTE — Progress Notes (Addendum)
 CRESTON, KLAS ([id number]) [id number] Page 1 of 13 Visit Report for 06/18/2023 Chief Complaint Document Details Patient Name: Date of Service: [name] [name] 06/18/2023 11:30 A M Medical Record Number: [id number] Patient Account Number: [id number] Date of Birth/Sex: Treating RN: 06-14-1956 (68 y.o. [name] [name] Primary Care Provider: Rexanne [name] Other Clinician: Referring Provider: Treating Provider/Extender: Matthew Andre [name] [name] in Treatment: 48 Information Obtained from: Patient Chief Complaint Bilateral LE ulcers and lymphedema Electronic Signature(s) Signed: 06/18/2023 12:30:45 PM By: Matthew Andre PA-C Entered By: Matthew Andre on 06/18/2023 09:30:45 -------------------------------------------------------------------------------- Debridement Details Patient Name: Date of Service: [name] Matthew J. 06/18/2023 11:30 A M Medical Record Number: [id number] Patient Account Number: [id number] Date of Birth/Sex: Treating RN: [birth year] (68 y.o. [name] [name] Primary Care Provider: Rexanne [name] Other Clinician: Referring Provider: Treating Provider/Extender: Matthew Andre [name] [name] in Treatment: 48 Debridement Performed for Assessment: Wound #3 Right Lower Leg Performed By: Physician Matthew Andre, PA-C Debridement Type: Debridement Level of Consciousness (Pre-procedure): Awake and Alert Pre-procedure Verification/Time Out Yes - 12:35 Taken: Start Time: 12:35 Percent of Wound Bed Debrided: 100% T Area Debrided (cm): otal 0.92 Tissue and other material debrided: Viable, Non-Viable, Slough, Subcutaneous, Slough Level: Skin/Subcutaneous Tissue Debridement Description: Excisional Instrument: Curette Bleeding: Minimum Hemostasis Achieved: Pressure Response to Treatment: Procedure was tolerated well Level of Consciousness (Post- Awake and Alert procedure): [name], [name] ([id number]) [id number]  Page 2 of 13 Post Debridement Measurements of Total Wound Length: (cm) 0.9 Width: (cm) 1.3 Depth: (cm) 0.2 Volume: (cm) 0.184 Character of Wound/Ulcer Post Debridement: Stable Post Procedure Diagnosis Same as Pre-procedure Electronic Signature(s) Signed: 06/18/2023 12:49:39 PM By: Matthew Andre PA-C Signed: 06/18/2023 12:54:59 PM By: Matthew Blossom MSN RN CNS WTA Signed: 06/23/2023 5:49:46 PM By: [name] Entered By: [name] on 06/18/2023 09:36:18 -------------------------------------------------------------------------------- Debridement Details Patient Name: Date of Service: [name] Matthew J. 06/18/2023 11:30 A M Medical Record Number: [id number] Patient Account Number: [id number] Date of Birth/Sex: Treating RN: [birth year] (68 y.o. [name] [name] Primary Care Provider: Rexanne [name] Other Clinician: Referring Provider: Treating Provider/Extender: Matthew Andre [name] [name] in Treatment: 48 Debridement Performed for Assessment: Wound #7 Right,Distal,Lateral Lower Leg Performed By: Physician Matthew Andre, PA-C Debridement Type: Debridement Severity of Tissue Pre Debridement: Fat layer exposed Level of Consciousness (Pre-procedure): Awake and Alert Pre-procedure Verification/Time Out Yes - 12:36 Taken: Start Time: 12:36 Percent of Wound Bed Debrided: 100% T Area Debrided (cm): otal 0.94 Tissue and other material debrided: Viable, Non-Viable, Slough, Subcutaneous, Slough Level: Skin/Subcutaneous Tissue Debridement Description: Excisional Instrument: Curette Bleeding: Minimum Hemostasis Achieved: Pressure Response to Treatment: Procedure was tolerated well Level of Consciousness (Post- Awake and Alert procedure): Post Debridement Measurements of Total Wound Length: (cm) 0.8 Width: (cm) 1.5 Depth: (cm) 0.1 Volume: (cm) 0.094 Character of Wound/Ulcer Post Debridement: Stable Severity of Tissue Post Debridement: Fat layer exposed Post Procedure Diagnosis Same  as Pre-procedure Electronic Signature(s) Signed: 06/18/2023 12:49:39 PM By: Matthew Andre PA-C Signed: 06/18/2023 12:54:59 PM By: Matthew Blossom MSN RN CNS WTA Signed: 06/23/2023 5:49:46 PM By: [name] [name] ([id number]) [id number] Page 3 of 13 Entered By: [name] on 06/18/2023 09:37:27 -------------------------------------------------------------------------------- Debridement Details Patient Name: Date of Service: [name] [name] 06/18/2023 11:30 A M Medical Record Number: [id number] Patient Account Number: [id number] Date of Birth/Sex: Treating RN: 1956/06/13 (68 y.o. [name] [name] Primary Care Provider: Rexanne [name] Other Clinician: Referring Provider: Treating Provider/Extender: Matthew Andre [name] [name] in Treatment: 48 Debridement Performed  for Assessment: Wound #6 Left,Posterior Lower Leg Performed By: Physician Matthew Ferraris, PA-C Debridement Type: Debridement Level of Consciousness (Pre-procedure): Awake and Alert Pre-procedure Verification/Time Out Yes - 12:38 Taken: Start Time: 12:38 Percent of Wound Bed Debrided: 100% T Area Debrided (cm): otal 0.27 Tissue and other material debrided: Viable, Non-Viable, Slough, Subcutaneous, Slough Level: Skin/Subcutaneous Tissue Debridement Description: Excisional Instrument: Curette Bleeding: Minimum Hemostasis Achieved: Pressure Response to Treatment: Procedure was tolerated well Level of Consciousness (Post- Awake and Alert procedure): Post Debridement Measurements of Total Wound Length: (cm) 0.7 Width: (cm) 0.5 Depth: (cm) 0.1 Volume: (cm) 0.027 Character of Wound/Ulcer Post Debridement: Stable Post Procedure Diagnosis Same as Pre-procedure Electronic Signature(s) Signed: 06/18/2023 12:49:39 PM By: Matthew Ferraris PA-C Signed: 06/18/2023 12:54:59 PM By: Matthew Blossom MSN RN CNS WTA Signed: 06/23/2023 5:49:46 PM By: [name] Entered By: [name] on 06/18/2023  09:40:53 -------------------------------------------------------------------------------- HPI Details Patient Name: Date of Service: [name], Matthew Matthew J. 06/18/2023 11:30 A [name] ([id number]) [id number] Page 4 of 13 Medical Record Number: [id number] Patient Account Number: [id number] Date of Birth/Sex: Treating RN: 09/08/1955 (68 y.o. [name] [name] Primary Care Provider: Rexanne [name] Other Clinician: Referring Provider: Treating Provider/Extender: [name] [name] [name] in Treatment: 48 History of Present Illness HPI Description: 07-16-2022 upon evaluation today patient appears to be doing poorly currently in regard to his his legs. The left leg is actually worse than the right. This is a patient that is previously been seen in the [place] office and at that point was doing really quite well with compression wrapping and often alginate are either Hydrofera Blue dressings. Fortunately he has gone since November 2020 until just current with out having any issue or need for wound care services which is great news. Unfortunately he is here today because he is having some issues at this point. Patient does have a history of several medical conditions which include diabetes mellitus type 2, lymphedema, hypertension, congestive heart failure, and cardiac arrhythmia though is not sure that this is actually atrial fibrillation based on what he has been told. He does have lymphedema pumps but tells me he has not used them since the summer when he had to move to a remodel of his building and subsequently never unpacked them. 07-23-2022 upon evaluation today patient appears to be doing well with regard to his legs. He is going require some sharp debridement today but overall seems to be making some progress. I am pleased in that regard he has much less drainage that he had did last week I think the compression wraps are definitely helping. 07-30-2022 upon  evaluation today patient appears to be doing poorly in regard to his legs he actually has blue-green drainage which I think is consistent with Pseudomonas. I believe that we need to address this as soon as possible and I discussed that with the patient today as well. Fortunately I do not see any signs of active infection locally nor systemically at this time which is great news. No fevers, chills, nausea, vomiting, or diarrhea. 08-13-2022 upon evaluation today patient appears to be doing well currently in regard to his leg ulcers which are actually looking much better. Fortunately there does not appear to be any signs of active infection at this time which is great news. No fevers, chills, nausea, vomiting, or diarrhea. 3/7; patient with predominantly a left medial lower extremity wound as well as several smaller areas and a small area on the right lateral. His Patsy came in today.  We are using silver alginate as the primary dressing 09-03-2022 upon evaluation today patient appears to be doing well currently in regard to his wounds. He has been tolerating the dressing changes without complication. Fortunately there does not appear to be any signs of infection there is needed however for sharp debridement. 09-10-2022 upon evaluation today patient appears to be doing well currently in regard to his wound. Has been tolerating the dressing changes without complication. Fortunately there does not appear to be any signs of infection looking systemically which is great news and overall I am extremely pleased with where we stand today. I do not see any signs of infection. 09-17-2022 upon evaluation today patient actually is making signs of improvement the good news is he is making good improvement. With that being said he still is having quite a time keeping the swelling down. We have been using Tubigrip I think that still probably our best option but nonetheless I think that we are making progress with regard  to his wounds. 09-24-2022 upon evaluation today patient appears to be doing well currently in regard to his wounds is continuing to make improvements at this point which is great news and overall I am extremely pleased with where we stand today. Fortunately I do not see any evidence of active infection at this time which is great news and in general I think he is doing quite well with the [facility] and silver alginate. 10-01-2022 upon evaluation today patient appears to be doing pretty well currently in regard to his wounds. He has been tolerating the dressing changes without complication. Fortunately I do not see any evidence of active infection locally nor systemically which is great news and I do believe that 10-08-2022 upon evaluation today patient's wounds actually are showing some signs of improvement which is great news. Still this is very slow to heal I really feel like he would do better if we can get him in some stronger compression wrapping he does have the juxta lite compression wraps therefore we can actually see about doing that over top of his Tubigrip to see if that can be beneficial. He does not love the hide he had as they are not the most comfortable thing for him but nonetheless I think it would be beneficial he is in agreement with going forward with this. 10-15-2022 upon evaluation today patient appears to be doing well currently in regard to his wounds that do not appear to be infected which is good news. With that being said unfortunately he still continues to have significant amount of swelling we really need to do something to try to get the swelling under control. I do not see any signs of infection locally nor systemically but at the same time the amount of edema is tremendous. The juxta lite helps but again he is only taking the fluid pills once or twice a week due to the amount that makes him go to the restroom. Therefore he does not take this daily which is really what he needs  on a more regular basis. We need to try to get some compression on and is a little bit stronger. 10-22-2022 upon evaluation today patient actually appears to be making some really good progress in regard to his wound. The compression wrap was put on last time actually doing a great job he looks much improved and in general I feel like that we are making great progress. I do not see any evidence of active infection locally nor systemically  which is great news. 10-29-2022 upon evaluation today patient appears to be doing decently well in regard to his legs currently. I feel like that his swelling is much better I feel like that he is also doing much better in regard to the wounds in general. I do not see any signs of active infection which is great news and in general I think that he is making excellent progress towards complete resolution. The patient does seem to have 1 issue that is with his wraps actually slipping down working I definitely need to work on that aspect. 11-05-2022 upon evaluation today patient appears to be doing excellent in regard to his wound. He has been tolerating the dressing changes without complication. He actually seems to be making excellent progress I am extremely pleased with where things stand currently. I do believe that we are really making good progress and I think that we are on the right track towards complete closure I think the compression wrap has been a dramatic shift in a good way for him as far as getting the wounds healed a lot of these areas that we will continue to leave Completely sealed up. 11-12-2022 upon evaluation today patient appears to be doing well currently in regard to his leg ulcer. The compression wrapping seems to be doing an excellent job. Fortunately I do not see any evidence of active infection locally nor systemically which is great news and in general I do believe that we are making good headway here towards complete closure. 11-19-2022 upon  evaluation today patient appears to be doing well currently in regard to his wounds. He is actually showing signs of excellent improvement and very pleased with where we stand I think that he is making great progress. I do not see any evidence of infection at this time which is great news. 11-26-2022 upon evaluation today patient appears to be doing well currently in regard to his wound. He is actually been tolerating the dressing changes without complication. Fortunately I do not see any evidence of active infection locally nor systemically which is great news I think he is making excellent progress here towards closure. 12-03-2022 upon evaluation today patient appears to be doing well currently in regard to his wound. He in fact at both locations is doing great and seems to be making excellent progress and very pleased in that regard. I do not see any signs of active infection at this time. 12-10-2022 upon evaluation patient's wound is actually significantly smaller with one of the satellite lesions closed and there is just 1 small area remaining and ISRRAEL, FLUCKIGER ([id number]) [id number] Page 5 of 13 this is actually doing quite well. I am very pleased with her progress. 12-15-2022 upon evaluation today patient appears to be doing actually pretty well in regard to his wound on the left leg. He does have a little area on the backside that open that was previously close last week due to the fact that his wrap got wet over the weekend and he had to make do with stuff they can find at the drugstore. They made this work out however and the good news is he actually seems to be doing significantly better and the wound looks fine except for the fact that he has a couple of areas that just reopen on the backside very tiny. Nonetheless I do believe that we can go ahead and see what we do about trying to get this moving in the right direction yet again. 12-22-2022 upon  evaluation today  patient appears to be doing poorly in regard to his wrap slid down and his leg is extremely swollen. It is also painful secondary to it being so swollen. Fortunately I do not see any evidence of infection right now but at the same time I do believe that if we do not get the swelling under control is not really good to alleviate his discomfort. We definitely can need to change his wrap out however in short order. 12-31-2022 upon evaluation today patient appears to be doing well currently in regard to his wound. He has been tolerating the dressing changes without complication. This actually looks to be doing much better today compared to where we were. Fortunately I do not see any evidence of active infection locally or systemically which is great news. 01-11-2023 upon evaluation today patient appears to be doing well currently in regard to his wound which is actually showing signs of significant improvement. Fortunately I do not see any signs of infection locally or systemically which is great news and in general I do believe that we are making good headway towards complete closure. 01-19-2023 upon evaluation today patient appears to be doing well currently hemoglobin to his wound. He has been tolerating the dressing changes without complication. Fortunately the wound is not infected unfortunately it is a little bigger due to the fact that the wrap slid down. I do not see any signs of worsening or infection at this time. 01-28-2023 upon evaluation today patient appears to be doing well currently in regard to his wound. He has been tolerating the dressing changes without complication. Fortunately I do not see any signs of infection the compression wrap is doing a really good job and I think it worked pretty much just about healed. 02-04-2023 upon evaluation today patient appears to be doing excellent in regard to his wounds. In fact he is pretty much completely healed on the left although I think this may  have just a pinpoint opening. Nonetheless I think it is 1 more week to toughen up. 02-16-2023 upon evaluation today patient appears to be doing well currently in regard to his wound. He has been tolerating the dressing changes without complication. Fortunately there does not appear to be any evidence of active infection locally or systemically which is good news. In fact the original wounds we been taking care of on the left and right legs are completely healed unfortunately his wrap slipped down the right leg and there are 2 small areas on the anterior portion of his leg which are open at this point. When I wrapped his right leg the left leg is completely closed however he is in agreement to his own compression sock. 02-26-2023 upon evaluation today patient appears to be doing well currently in regard to his left leg which is still close in the right leg is doing significantly better. Fortunately there does not appear to be any signs of active infection locally or systemically which is great news and in general I do believe that we will making headway towards complete closure. 03-05-2023 upon evaluation today patient actually appears to be doing excellent in regard to his wounds one of the areas healed the other is significantly improved. I am actually very pleased with where we stand today and I think that he is doing well with his compression stockings which is excellent as well. 03-19-2023 upon evaluation today patient appears to be doing poorly currently in regard to his wounds. He seems to be doing  a bit worse as far as both legs are concerned as far as open wounds and I think that we are going need to address this rapidly to keep it from backtracking and getting significantly worse. He voiced understanding he kind of somewhat expected I believe. 03-29-2023 upon evaluation patient actually showing signs of definite improvement with regard to his wounds currently with some already drying up and on  the right lateral leg this is much smaller. Will continue to wrap him this seems to be doing quite well. 04-06-2023 upon evaluation today patient's wounds on the legs actually appear to be doing better which is great news. Fortunately I do not see any evidence of worsening overall and I do believe that the patient is making good headway towards closure which is good news. I do not see any signs of infection which is also excellent. He actually only had the wraps on for a couple of days before they started slipping down he had cut them off and therefore spent the remaining 5 days pretty much just in his compression socks he seems of done very well though. 04-13-2023 upon evaluation patient is down to just 1 wound on the right leg everything appears to be doing excellent I think he is making excellent progress towards complete closure. Fortunately I do not see any signs of active infection locally or systemically which is great news. 04-20-2023 upon evaluation today patient appears to be doing well currently in regard to his wound. He has been tolerating the dressing changes without complication. Fortunately there does not appear to be any signs of active infection at this time. No fevers, chills, nausea, vomiting, or diarrhea. 04-27-2023 upon evaluation today patient appears to be doing a little worse with some irritation around the sides of his wounds. I am not sure if he has been wearing his compression all the time but he does tell me which I Cellerate well that his dressings were not quite in place and therefore there is more drainage and irritation I think due to the fact that he had accidentally put it in the wrong location on his right leg. Nonetheless the wound does not Bigger but this just some irritation of the skin around. 05-04-2023 upon evaluation today patient's wounds are actually showing some signs of improvement here which is great news. Fortunately I do not see any signs of worsening  overall and I believe that he is making good headway here towards closure which is great news. 05-27-2023 upon evaluation today patient appears to be doing little better in regard to his wounds. Fortunately I do not see any signs of active infection at this time. He is using his own compression stockings and he has no new skin tear/injury from tape he is doing much better in my opinion. 06-03-2023 upon evaluation today patient appears to be doing poorly currently in regard to his legs. He has been tolerating the dressing changes without complication. Fortunately I do not see any signs of active infection at this time although he unfortunately is also not healing very well he still has a lot of swelling. We have been trying to have him use his own compression socks and avoid putting back in the compression wrapping but I think at this point with her need to go back to the compression wrapping. 06-18-23 upon evaluation today patient appears to be doing well currently in regard to his wound. He has been tolerating the dressing changes without complication. Fortunately there does not appear to be  any signs of infection which is good news. He has been using his juxta lites with a smaller size Tubigrip which I think has done much better for him. Electronic Signature(s) Signed: 06/18/2023 12:41:29 PM By: Matthew Ferraris PA-C Entered By: [name] on 06/18/2023 09:41:29 [name] ([id number]) [id number] Page 6 of 13 -------------------------------------------------------------------------------- Physical Exam Details Patient Name: Date of Service: [name] [name] 06/18/2023 11:30 A M Medical Record Number: [id number] Patient Account Number: [id number] Date of Birth/Sex: Treating RN: Dec 21, 1955 (68 y.o. [name] [name] Primary Care Provider: Rexanne [name] Other Clinician: Referring Provider: Treating Provider/Extender: [name] [name] [name] in Treatment:  40 Constitutional Well-nourished and well-hydrated in no acute distress. Respiratory normal breathing without difficulty. Psychiatric this patient is able to make decisions and demonstrates good insight into disease process. Alert and Oriented x 3. pleasant and cooperative. Notes Upon inspection patient's wound bed actually showed signs of good granulation epithelization at this point. Fortunately I do not see any signs of infection which is good news and in general I think that we are making headway here towards closure. Electronic Signature(s) Signed: 06/18/2023 12:41:44 PM By: Matthew Ferraris PA-C Entered By: [name] on 06/18/2023 09:41:44 -------------------------------------------------------------------------------- Physician Orders Details Patient Name: Date of Service: [name] Matthew J. 06/18/2023 11:30 A M Medical Record Number: [id number] Patient Account Number: [id number] Date of Birth/Sex: Treating RN: Jan 24, 1956 (68 y.o. [name] [name] Primary Care Provider: Rexanne [name] Other Clinician: Referring Provider: Treating Provider/Extender: [name] [name] [name] in Treatment: 48 The following information was scribed by: [name] The information was scribed for: [name] Verbal / Phone Orders: No Diagnosis Coding ICD-10 Coding Code Description E11.622 Type 2 diabetes mellitus with other skin ulcer I89.0 Lymphedema, not elsewhere classified L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 L97.812 Non-pressure chronic ulcer of other part of right lower leg with fat layer exposed I10 Essential (primary) hypertension [name], SARA ([id number]) [phone].pdf Page 7 of 13 I50.42 Chronic combined systolic (congestive) and diastolic (congestive) heart failure I49.9 Cardiac arrhythmia, unspecified Follow-up Appointments Return Appointment in 1 week. Nurse Visit as needed - Nurse visit Monday  (12/23) Friday (12/27) Monday (12/30) Thursday (1/2) Bathing/ Shower/ Hygiene May shower with wound dressing protected with water repellent cover or cast protector. Anesthetic (Use 'Patient Medications' Section for Anesthetic Order Entry) Lidocaine  applied to wound bed Edema Control - Orders / Instructions Elevate, Exercise Daily and A void Standing for Long Periods of Time. Patient to wear own Velcro compression garment. Remove compression stockings every night before going to bed and put on every morning when getting up. - juxtalite bilat lower legs Elevate legs to the level of the heart and pump ankles as often as possible Elevate leg(s) parallel to the floor when sitting. DO YOUR BEST to sleep in the bed at night. DO NOT sleep in your recliner. Long hours of sitting in a recliner leads to swelling of the legs and/or potential wounds on your backside. Wound Treatment Wound #3 - Lower Leg Wound Laterality: Right Cleanser: Soap and Water 2 x Per Week/30 Days Discharge Instructions: Gently cleanse wound with antibacterial soap, rinse and pat dry prior to dressing wounds Prim Dressing: Silvercel Small 2x2 (in/in) 2 x Per Week/30 Days ary Discharge Instructions: Apply Silvercel Small 2x2 (in/in) as instructed Secondary Dressing: ABD Pad 5x9 (in/in) 2 x Per Week/30 Days Discharge Instructions: Cover with ABD pad Wound #6 - Lower Leg Wound Laterality: Left, Posterior  Cleanser: Soap and Water 2 x Per Week/30 Days Discharge Instructions: Gently cleanse wound with antibacterial soap, rinse and pat dry prior to dressing wounds Prim Dressing: Silvercel Small 2x2 (in/in) 2 x Per Week/30 Days ary Discharge Instructions: Apply Silvercel Small 2x2 (in/in) as instructed Secondary Dressing: ABD Pad 5x9 (in/in) 2 x Per Week/30 Days Discharge Instructions: Cover with ABD pad Wound #7 - Lower Leg Wound Laterality: Right, Lateral, Distal Cleanser: Soap and Water 2 x Per Week/30 Days Discharge  Instructions: Gently cleanse wound with antibacterial soap, rinse and pat dry prior to dressing wounds Prim Dressing: Silvercel Small 2x2 (in/in) 2 x Per Week/30 Days ary Discharge Instructions: Apply Silvercel Small 2x2 (in/in) as instructed Secondary Dressing: ABD Pad 5x9 (in/in) 2 x Per Week/30 Days Discharge Instructions: Cover with ABD pad Electronic Signature(s) Signed: 06/18/2023 12:49:39 PM By: Matthew Ferraris PA-C Signed: 06/23/2023 5:49:46 PM By: [name] Entered By: [name] on 06/18/2023 09:42:04 Problem List Details -------------------------------------------------------------------------------- [name] ([id number]) [id number] Page 8 of 13 Patient Name: Date of Service: [name] [name] 06/18/2023 11:30 A M Medical Record Number: [id number] Patient Account Number: [id number] Date of Birth/Sex: Treating RN: 01/30/1956 (68 y.o. [name] [name] Primary Care Provider: Rexanne [name] Other Clinician: Referring Provider: Treating Provider/Extender: [name] [name] [name] in Treatment: 48 Active Problems ICD-10 Encounter Code Description Active Date MDM Diagnosis E11.622 Type 2 diabetes mellitus with other skin ulcer 07/16/2022 No Yes I89.0 Lymphedema, not elsewhere classified 07/16/2022 No Yes L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2/06/2022 No Yes L97.812 Non-pressure chronic ulcer of other part of right lower leg with fat layer 07/16/2022 No Yes exposed I10 Essential (primary) hypertension 07/16/2022 No Yes I50.42 Chronic combined systolic (congestive) and diastolic (congestive) heart failure 07/16/2022 No Yes I49.9 Cardiac arrhythmia, unspecified 07/16/2022 No Yes Inactive Problems Resolved Problems Electronic Signature(s) Signed: 06/18/2023 12:30:42 PM By: Matthew Ferraris PA-C Entered By: [name] on 06/18/2023  09:30:41 -------------------------------------------------------------------------------- Progress Note Details Patient Name: Date of Service: [name] Matthew J. 06/18/2023 11:30 A M Medical Record Number: [id number] Patient Account Number: [id number] Date of Birth/Sex: Treating RN: 09-02-1955 (68 y.o. [name] [name] Primary Care Provider: Rexanne [name] Other Clinician: Referring Provider: Treating Provider/Extender: [name] [name] [name] in Treatment: [address], [place] ([id number]) [id number] Page 9 of 13 Chief Complaint Information obtained from Patient Bilateral LE ulcers and lymphedema History of Present Illness (HPI) 07-16-2022 upon evaluation today patient appears to be doing poorly currently in regard to his his legs. The left leg is actually worse than the right. This is a patient that is previously been seen in the [place] office and at that point was doing really quite well with compression wrapping and often alginate are either Hydrofera Blue dressings. Fortunately he has gone since November 2020 until just current with out having any issue or need for wound care services which is great news. Unfortunately he is here today because he is having some issues at this point. Patient does have a history of several medical conditions which include diabetes mellitus type 2, lymphedema, hypertension, congestive heart failure, and cardiac arrhythmia though is not sure that this is actually atrial fibrillation based on what he has been told. He does have lymphedema pumps but tells me he has not used them since the summer when he had to move to a remodel of his building and subsequently never unpacked them. 07-23-2022 upon evaluation today patient appears to be  doing well with regard to his legs. He is going require some sharp debridement today but overall seems to be making some progress. I am pleased in that regard he has much less  drainage that he had did last week I think the compression wraps are definitely helping. 07-30-2022 upon evaluation today patient appears to be doing poorly in regard to his legs he actually has blue-green drainage which I think is consistent with Pseudomonas. I believe that we need to address this as soon as possible and I discussed that with the patient today as well. Fortunately I do not see any signs of active infection locally nor systemically at this time which is great news. No fevers, chills, nausea, vomiting, or diarrhea. 08-13-2022 upon evaluation today patient appears to be doing well currently in regard to his leg ulcers which are actually looking much better. Fortunately there does not appear to be any signs of active infection at this time which is great news. No fevers, chills, nausea, vomiting, or diarrhea. 3/7; patient with predominantly a left medial lower extremity wound as well as several smaller areas and a small area on the right lateral. His Patsy came in today. We are using silver alginate as the primary dressing 09-03-2022 upon evaluation today patient appears to be doing well currently in regard to his wounds. He has been tolerating the dressing changes without complication. Fortunately there does not appear to be any signs of infection there is needed however for sharp debridement. 09-10-2022 upon evaluation today patient appears to be doing well currently in regard to his wound. Has been tolerating the dressing changes without complication. Fortunately there does not appear to be any signs of infection looking systemically which is great news and overall I am extremely pleased with where we stand today. I do not see any signs of infection. 09-17-2022 upon evaluation today patient actually is making signs of improvement the good news is he is making good improvement. With that being said he still is having quite a time keeping the swelling down. We have been using Tubigrip I  think that still probably our best option but nonetheless I think that we are making progress with regard to his wounds. 09-24-2022 upon evaluation today patient appears to be doing well currently in regard to his wounds is continuing to make improvements at this point which is great news and overall I am extremely pleased with where we stand today. Fortunately I do not see any evidence of active infection at this time which is great news and in general I think he is doing quite well with the [facility] and silver alginate. 10-01-2022 upon evaluation today patient appears to be doing pretty well currently in regard to his wounds. He has been tolerating the dressing changes without complication. Fortunately I do not see any evidence of active infection locally nor systemically which is great news and I do believe that 10-08-2022 upon evaluation today patient's wounds actually are showing some signs of improvement which is great news. Still this is very slow to heal I really feel like he would do better if we can get him in some stronger compression wrapping he does have the juxta lite compression wraps therefore we can actually see about doing that over top of his Tubigrip to see if that can be beneficial. He does not love the hide he had as they are not the most comfortable thing for him but nonetheless I think it would be beneficial he is in agreement with going  forward with this. 10-15-2022 upon evaluation today patient appears to be doing well currently in regard to his wounds that do not appear to be infected which is good news. With that being said unfortunately he still continues to have significant amount of swelling we really need to do something to try to get the swelling under control. I do not see any signs of infection locally nor systemically but at the same time the amount of edema is tremendous. The juxta lite helps but again he is only taking the fluid pills once or twice a week due to the  amount that makes him go to the restroom. Therefore he does not take this daily which is really what he needs on a more regular basis. We need to try to get some compression on and is a little bit stronger. 10-22-2022 upon evaluation today patient actually appears to be making some really good progress in regard to his wound. The compression wrap was put on last time actually doing a great job he looks much improved and in general I feel like that we are making great progress. I do not see any evidence of active infection locally nor systemically which is great news. 10-29-2022 upon evaluation today patient appears to be doing decently well in regard to his legs currently. I feel like that his swelling is much better I feel like that he is also doing much better in regard to the wounds in general. I do not see any signs of active infection which is great news and in general I think that he is making excellent progress towards complete resolution. The patient does seem to have 1 issue that is with his wraps actually slipping down working I definitely need to work on that aspect. 11-05-2022 upon evaluation today patient appears to be doing excellent in regard to his wound. He has been tolerating the dressing changes without complication. He actually seems to be making excellent progress I am extremely pleased with where things stand currently. I do believe that we are really making good progress and I think that we are on the right track towards complete closure I think the compression wrap has been a dramatic shift in a good way for him as far as getting the wounds healed a lot of these areas that we will continue to leave Completely sealed up. 11-12-2022 upon evaluation today patient appears to be doing well currently in regard to his leg ulcer. The compression wrapping seems to be doing an excellent job. Fortunately I do not see any evidence of active infection locally nor systemically which is great news  and in general I do believe that we are making good headway here towards complete closure. 11-19-2022 upon evaluation today patient appears to be doing well currently in regard to his wounds. He is actually showing signs of excellent improvement and very pleased with where we stand I think that he is making great progress. I do not see any evidence of infection at this time which is great news. 11-26-2022 upon evaluation today patient appears to be doing well currently in regard to his wound. He is actually been tolerating the dressing changes without complication. Fortunately I do not see any evidence of active infection locally nor systemically which is great news I think he is making excellent progress here towards closure. 12-03-2022 upon evaluation today patient appears to be doing well currently in regard to his wound. He in fact at both locations is doing great and seems to be  making excellent progress and very pleased in that regard. I do not see any signs of active infection at this time. 12-10-2022 upon evaluation patient's wound is actually significantly smaller with one of the satellite lesions closed and there is just 1 small area remaining and this is actually doing quite well. I am very pleased with her progress. 12-15-2022 upon evaluation today patient appears to be doing actually pretty well in regard to his wound on the left leg. He does have a little area on the backside that open that was previously close last week due to the fact that his wrap got wet over the weekend and he had to make do with stuff they can find at the [facility], [facility] ([id number]) [id number] Page 10 of 13 drugstore. They made this work out however and the good news is he actually seems to be doing significantly better and the wound looks fine except for the fact that he has a couple of areas that just reopen on the backside very tiny. Nonetheless I do believe that we can go ahead and see  what we do about trying to get this moving in the right direction yet again. 12-22-2022 upon evaluation today patient appears to be doing poorly in regard to his wrap slid down and his leg is extremely swollen. It is also painful secondary to it being so swollen. Fortunately I do not see any evidence of infection right now but at the same time I do believe that if we do not get the swelling under control is not really good to alleviate his discomfort. We definitely can need to change his wrap out however in short order. 12-31-2022 upon evaluation today patient appears to be doing well currently in regard to his wound. He has been tolerating the dressing changes without complication. This actually looks to be doing much better today compared to where we were. Fortunately I do not see any evidence of active infection locally or systemically which is great news. 01-11-2023 upon evaluation today patient appears to be doing well currently in regard to his wound which is actually showing signs of significant improvement. Fortunately I do not see any signs of infection locally or systemically which is great news and in general I do believe that we are making good headway towards complete closure. 01-19-2023 upon evaluation today patient appears to be doing well currently hemoglobin to his wound. He has been tolerating the dressing changes without complication. Fortunately the wound is not infected unfortunately it is a little bigger due to the fact that the wrap slid down. I do not see any signs of worsening or infection at this time. 01-28-2023 upon evaluation today patient appears to be doing well currently in regard to his wound. He has been tolerating the dressing changes without complication. Fortunately I do not see any signs of infection the compression wrap is doing a really good job and I think it worked pretty much just about healed. 02-04-2023 upon evaluation today patient appears to be doing excellent  in regard to his wounds. In fact he is pretty much completely healed on the left although I think this may have just a pinpoint opening. Nonetheless I think it is 1 more week to toughen up. 02-16-2023 upon evaluation today patient appears to be doing well currently in regard to his wound. He has been tolerating the dressing changes without complication. Fortunately there does not appear to be any evidence of active infection locally or systemically which is good news. In  fact the original wounds we been taking care of on the left and right legs are completely healed unfortunately his wrap slipped down the right leg and there are 2 small areas on the anterior portion of his leg which are open at this point. When I wrapped his right leg the left leg is completely closed however he is in agreement to his own compression sock. 02-26-2023 upon evaluation today patient appears to be doing well currently in regard to his left leg which is still close in the right leg is doing significantly better. Fortunately there does not appear to be any signs of active infection locally or systemically which is great news and in general I do believe that we will making headway towards complete closure. 03-05-2023 upon evaluation today patient actually appears to be doing excellent in regard to his wounds one of the areas healed the other is significantly improved. I am actually very pleased with where we stand today and I think that he is doing well with his compression stockings which is excellent as well. 03-19-2023 upon evaluation today patient appears to be doing poorly currently in regard to his wounds. He seems to be doing a bit worse as far as both legs are concerned as far as open wounds and I think that we are going need to address this rapidly to keep it from backtracking and getting significantly worse. He voiced understanding he kind of somewhat expected I believe. 03-29-2023 upon evaluation patient actually  showing signs of definite improvement with regard to his wounds currently with some already drying up and on the right lateral leg this is much smaller. Will continue to wrap him this seems to be doing quite well. 04-06-2023 upon evaluation today patient's wounds on the legs actually appear to be doing better which is great news. Fortunately I do not see any evidence of worsening overall and I do believe that the patient is making good headway towards closure which is good news. I do not see any signs of infection which is also excellent. He actually only had the wraps on for a couple of days before they started slipping down he had cut them off and therefore spent the remaining 5 days pretty much just in his compression socks he seems of done very well though. 04-13-2023 upon evaluation patient is down to just 1 wound on the right leg everything appears to be doing excellent I think he is making excellent progress towards complete closure. Fortunately I do not see any signs of active infection locally or systemically which is great news. 04-20-2023 upon evaluation today patient appears to be doing well currently in regard to his wound. He has been tolerating the dressing changes without complication. Fortunately there does not appear to be any signs of active infection at this time. No fevers, chills, nausea, vomiting, or diarrhea. 04-27-2023 upon evaluation today patient appears to be doing a little worse with some irritation around the sides of his wounds. I am not sure if he has been wearing his compression all the time but he does tell me which I Cellerate well that his dressings were not quite in place and therefore there is more drainage and irritation I think due to the fact that he had accidentally put it in the wrong location on his right leg. Nonetheless the wound does not Bigger but this just some irritation of the skin around. 05-04-2023 upon evaluation today patient's wounds are actually  showing some signs of improvement here which is  great news. Fortunately I do not see any signs of worsening overall and I believe that he is making good headway here towards closure which is great news. 05-27-2023 upon evaluation today patient appears to be doing little better in regard to his wounds. Fortunately I do not see any signs of active infection at this time. He is using his own compression stockings and he has no new skin tear/injury from tape he is doing much better in my opinion. 06-03-2023 upon evaluation today patient appears to be doing poorly currently in regard to his legs. He has been tolerating the dressing changes without complication. Fortunately I do not see any signs of active infection at this time although he unfortunately is also not healing very well he still has a lot of swelling. We have been trying to have him use his own compression socks and avoid putting back in the compression wrapping but I think at this point with her need to go back to the compression wrapping. 06-18-23 upon evaluation today patient appears to be doing well currently in regard to his wound. He has been tolerating the dressing changes without complication. Fortunately there does not appear to be any signs of infection which is good news. He has been using his juxta lites with a smaller size Tubigrip which I think has done much better for him. Objective Constitutional Well-nourished and well-hydrated in no acute distress. ZACHERIAH, STUMPE ([id number]) [id number] Page 11 of 13 Vitals Time Taken: 12:11 PM, Height: 73 in, Weight: 358 lbs, BMI: 47.2, Temperature: 97.9 F, Pulse: 65 bpm, Respiratory Rate: 18 breaths/min, Blood Pressure: 125/79 mmHg. Respiratory normal breathing without difficulty. Psychiatric this patient is able to make decisions and demonstrates good insight into disease process. Alert and Oriented x 3. pleasant and cooperative. General Notes: Upon  inspection patient's wound bed actually showed signs of good granulation epithelization at this point. Fortunately I do not see any signs of infection which is good news and in general I think that we are making headway here towards closure. Integumentary (Hair, Skin) Wound #3 status is Open. Original cause of wound was Gradually Appeared. The date acquired was: 02/04/2023. The wound has been in treatment 19 weeks. The wound is located on the Right Lower Leg. The wound measures 0.9cm length x 1.3cm width x 0.2cm depth; 0.919cm^2 area and 0.184cm^3 volume. There is a medium amount of serosanguineous drainage noted. Wound #6 status is Open. Original cause of wound was Blister. The date acquired was: 05/27/2023. The wound has been in treatment 3 weeks. The wound is located on the Left,Posterior Lower Leg. The wound measures 0.7cm length x 0.5cm width x 0.1cm depth; 0.275cm^2 area and 0.027cm^3 volume. There is a medium amount of serosanguineous drainage noted. Wound #7 status is Open. Original cause of wound was Blister. The date acquired was: 05/27/2023. The wound has been in treatment 3 weeks. The wound is located on the Right,Distal,Lateral Lower Leg. The wound measures 0.8cm length x 1.5cm width x 0.1cm depth; 0.942cm^2 area and 0.094cm^3 volume. There is a medium amount of serosanguineous drainage noted. Wound #8 status is Healed - Epithelialized. Original cause of wound was Skin Tear/Laceration. The date acquired was: 06/01/2023. The wound has been in treatment 2 weeks. The wound is located on the Right,Plantar Foot. The wound measures 0cm length x 0cm width x 0cm depth; 0cm^2 area and 0cm^3 volume. There is a small amount of serosanguineous drainage noted. Assessment Active Problems ICD-10 Type 2 diabetes mellitus with other skin ulcer Lymphedema,  not elsewhere classified Non-pressure chronic ulcer of other part of left lower leg with fat layer exposed Non-pressure chronic ulcer of other part  of right lower leg with fat layer exposed Essential (primary) hypertension Chronic combined systolic (congestive) and diastolic (congestive) heart failure Cardiac arrhythmia, unspecified Procedures Wound #3 Pre-procedure diagnosis of Wound #3 is a Lymphedema located on the Right Lower Leg . There was a Excisional Skin/Subcutaneous Tissue Debridement with a total area of 0.92 sq cm performed by Matthew Ferraris, PA-C. With the following instrument(s): Curette to remove Viable and Non-Viable tissue/material. Material removed includes Subcutaneous Tissue and Slough and. A time out was conducted at 12:35, prior to the start of the procedure. A Minimum amount of bleeding was controlled with Pressure. The procedure was tolerated well. Post Debridement Measurements: 0.9cm length x 1.3cm width x 0.2cm depth; 0.184cm^3 volume. Character of Wound/Ulcer Post Debridement is stable. Post procedure Diagnosis Wound #3: Same as Pre-Procedure Wound #6 Pre-procedure diagnosis of Wound #6 is a Lymphedema located on the Left,Posterior Lower Leg . There was a Excisional Skin/Subcutaneous Tissue Debridement with a total area of 0.27 sq cm performed by Matthew Ferraris, PA-C. With the following instrument(s): Curette to remove Viable and Non-Viable tissue/material. Material removed includes Subcutaneous Tissue and Slough and. A time out was conducted at 12:38, prior to the start of the procedure. A Minimum amount of bleeding was controlled with Pressure. The procedure was tolerated well. Post Debridement Measurements: 0.7cm length x 0.5cm width x 0.1cm depth; 0.027cm^3 volume. Character of Wound/Ulcer Post Debridement is stable. 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 There was a Excisional Skin/Subcutaneous Tissue Debridement with a total  area of 0.94 sq cm performed by Matthew Ferraris, PA-C. With the following instrument(s): Curette to remove Viable and Non-Viable tissue/material. Material removed includes Subcutaneous Tissue and Slough and. A time out was conducted at 12:36, prior to the start of the procedure. A Minimum amount of bleeding was controlled with Pressure. The procedure was tolerated well. Post Debridement Measurements: 0.8cm length x 1.5cm width x 0.1cm depth; 0.094cm^3 volume. Character of Wound/Ulcer Post Debridement is stable. Severity of Tissue Post Debridement is: Fat layer exposed. Post procedure Diagnosis Wound #7: Same as Pre-Procedure Plan KALLUM, JORGENSEN ([id number]) [phone].pdf Page 12 of 13 Follow-up Appointments: Return Appointment in 1 week. Nurse Visit as needed - Nurse visit Monday (12/23) Friday (12/27) Monday (12/30) Thursday (1/2) Bathing/ Shower/ Hygiene: May shower with wound dressing protected with water repellent cover or cast protector. Anesthetic (Use 'Patient Medications' Section for Anesthetic Order Entry): Lidocaine  applied to wound bed Edema Control - Orders / Instructions: Elevate, Exercise Daily and Avoid Standing for Long Periods of Time. Elevate legs to the level of the heart and pump ankles as often as possible Elevate leg(s) parallel to the floor when sitting. DO YOUR BEST to sleep in the bed at night. DO NOT sleep in your recliner. Long hours of sitting in a recliner leads to swelling of the legs and/or potential wounds on your backside. WOUND #3: - Lower Leg Wound Laterality: Right Cleanser: Soap and Water 2 x Per Week/30 Days Discharge Instructions: Gently cleanse wound with antibacterial soap, rinse and pat dry prior to dressing wounds Prim Dressing: Silvercel Small 2x2 (in/in) 2 x Per Week/30 Days ary Discharge Instructions: Apply Silvercel Small 2x2 (in/in) as instructed Secondary Dressing: ABD Pad 5x9 (  in/in) 2 x Per Week/30 Days Discharge  Instructions: Cover with ABD pad WOUND #6: - Lower Leg Wound Laterality: Left, Posterior Cleanser: Soap and Water 2 x Per Week/30 Days Discharge Instructions: Gently cleanse wound with antibacterial soap, rinse and pat dry prior to dressing wounds Prim Dressing: Silvercel Small 2x2 (in/in) 2 x Per Week/30 Days ary Discharge Instructions: Apply Silvercel Small 2x2 (in/in) as instructed Secondary Dressing: ABD Pad 5x9 (in/in) 2 x Per Week/30 Days Discharge Instructions: Cover with ABD pad WOUND #7: - Lower Leg Wound Laterality: Right, Lateral, Distal Cleanser: Soap and Water 2 x Per Week/30 Days Discharge Instructions: Gently cleanse wound with antibacterial soap, rinse and pat dry prior to dressing wounds Prim Dressing: Silvercel Small 2x2 (in/in) 2 x Per Week/30 Days ary Discharge Instructions: Apply Silvercel Small 2x2 (in/in) as instructed Secondary Dressing: ABD Pad 5x9 (in/in) 2 x Per Week/30 Days Discharge Instructions: Cover with ABD pad 1. I would recommend that we have the patient go ahead and continue with the silver alginate dressing which I think is doing quite well. 2. Also can recommend that he continue with the ABD pads with tape to secure. 3. I am also can recommend that he continue with the Tubigrip which I think has been beneficial. Along with the juxta fit so I think this is doing a really good job. We will see patient back for reevaluation in 1 week here in the clinic. If anything worsens or changes patient will contact our office for additional recommendations. Electronic Signature(s) Signed: 06/18/2023 12:42:11 PM By: Matthew Ferraris PA-C Entered By: [name] on 06/18/2023 09:42:11 -------------------------------------------------------------------------------- SuperBill Details Patient Name: Date of Service: [name], Matthew Matthew J. 06/18/2023 Medical Record Number: [id number] Patient Account Number: [id number] Date of Birth/Sex: Treating RN: 1955-11-28 (67 y.o. [name] [name] Primary Care Provider: Rexanne [name] Other Clinician: Referring Provider: Treating Provider/Extender: [name] [name] [name] in Treatment: 48 Diagnosis Coding ICD-10 Codes Code Description E11.622 Type 2 diabetes mellitus with other skin ulcer I89.0 Lymphedema, not elsewhere classified L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 L97.812 Non-pressure chronic ulcer of other part of right lower leg with fat layer exposed TWAN, HARKIN ([id number]) [phone].pdf Page 13 of 13 I10 Essential (primary) hypertension I50.42 Chronic combined systolic (congestive) and diastolic (congestive) heart failure I49.9 Cardiac arrhythmia, unspecified Facility Procedures : CPT4 Code: [id number] Description: 11042 - DEB SUBQ TISSUE 20 SQ CM/< ICD-10 Diagnosis Description L97.822 Non-pressure chronic ulcer of other part of left lower leg with fat layer expo L97.812 Non-pressure chronic ulcer of other part of right lower leg with fat layer exp Modifier: sed osed Quantity: 1 Physician Procedures : CPT4 Code Description Modifier 11042 11042 - WC PHYS SUBQ TISS 20 SQ CM ICD-10 Diagnosis Description L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 L97.812 Non-pressure chronic ulcer of other part of right  lower leg with fat layer exposed Quantity: 1 Electronic Signature(s) Signed: 06/18/2023 12:42:21 PM By: Matthew Ferraris PA-C Entered By: [name] on 06/18/2023 09:42:21

## 2023-06-21 ENCOUNTER — Ambulatory Visit: Payer: PPO | Admitting: Podiatry

## 2023-06-24 NOTE — Progress Notes (Signed)
 EZEKIEL, MENZER ([id number]) [id number] Page 1 of 12 Visit Report for 06/18/2023 Arrival Information Details Patient Name: Date of Service: [name] [name] 06/18/2023 11:30 A M Medical Record Number: [id number] Patient Account Number: [id number] Date of Birth/Sex: Treating RN: [name] (68 y.o. NETTY [name] Primary Care [name]: [name] Other Clinician: Referring [name]: Treating [name]/Extender: Bethena Andre [name] [name]: 49 Visit Information History Since Last Visit All ordered tests and consults were completed: No Patient Arrived: [name] Added or deleted any medications: No Arrival Time: 12:04 Any new allergies or adverse reactions: No Transfer Assistance: None Had a fall or experienced change in No Patient Identification Verified: Yes activities of daily living that Matthew affect Secondary Verification Process Completed: Yes risk of falls: Patient Requires Transmission-Based Precautions: No Signs or symptoms of abuse/neglect since last visito No Patient Has Alerts: No Hospitalized since last visit: No Implantable device outside of the clinic excluding No cellular tissue based products placed in the center since last visit: Has Dressing in Place as Prescribed: Yes Has Compression in Place as Prescribed: Yes Pain Present Now: Yes Electronic Signature(s) Signed: 06/23/2023 5:49:46 PM By: Purcell Sniff Entered By: Purcell Sniff on 06/18/2023 12:10:51 -------------------------------------------------------------------------------- Clinic Level of Care Assessment Details Patient Name: Date of Service: [name] [name] 06/18/2023 11:30 A M Medical Record Number: [id number] Patient Account Number: [id number] Date of Birth/Sex: Treating RN: [birth year][name] (68 y.o. NETTY [name] Primary Care Ayinde Swim: [name] Other Clinician: Referring Yanitza Shvartsman: Treating Carlin Mamone/Extender: Bethena Andre [name] Matthew in  [name]: 49 Clinic Level of Care Assessment Items TOOL 1 Quantity Score []  - 0 Use when EandM and Procedure is performed on INITIAL visit ASSESSMENTS - Nursing Assessment / Reassessment []  - 0 General Physical Exam (combine w/ comprehensive assessment (listed just below) when performed on new pt. [address] KALE, RONDEAU ([id number]) [id number] Page 2 of 12 []  - 0 Comprehensive Assessment (HX, ROS, Risk Assessments, Wounds Hx, etc.) ASSESSMENTS - Wound and Skin Assessment / Reassessment []  - 0 Dermatologic / Skin Assessment (not related to wound area) ASSESSMENTS - Ostomy and/or Continence Assessment and Care []  - 0 Incontinence Assessment and Management []  - 0 Ostomy Care Assessment and Management (repouching, etc.) PROCESS - Coordination of Care []  - 0 Simple Patient / Family Education for ongoing care []  - 0 Complex (extensive) Patient / Family Education for ongoing care []  - 0 Staff obtains Chiropractor, Records, T Results / Process Orders est []  - 0 Staff telephones HHA, Nursing Homes / Clarify orders / etc []  - 0 Routine Transfer to another Facility (non-emergent condition) []  - 0 Routine Hospital Admission (non-emergent condition) []  - 0 New Admissions / Manufacturing Engineer / Ordering NPWT Apligraf, etc. , []  - 0 Emergency Hospital Admission (emergent condition) PROCESS - Special Needs []  - 0 Pediatric / Minor Patient Management []  - 0 Isolation Patient Management []  - 0 Hearing / Language / Visual special needs []  - 0 Assessment of Community assistance (transportation, D/C planning, etc.) []  - 0 Additional assistance / Altered mentation []  - 0 Support Surface(s) Assessment (bed, cushion, seat, etc.) INTERVENTIONS - Miscellaneous []  - 0 External ear exam []  - 0 Patient Transfer (multiple staff / Nurse, Adult / Similar devices) []  - 0 Simple Staple / Suture removal (25 or less) []  - 0 Complex Staple / Suture removal (26 or  more) []  - 0 Hypo/Hyperglycemic Management (do not check if billed separately) []  - 0 Ankle / Brachial Index (ABI) - do not check  if billed separately Has the patient been seen at the hospital within the last three years: Yes Total Score: 0 Level Of Care: ____ Electronic Signature(s) Signed: 06/23/2023 5:49:46 PM By: Purcell Sniff Entered By: Purcell Sniff on 06/18/2023 12:42:24 -------------------------------------------------------------------------------- Encounter Discharge Information Details Patient Name: Date of Service: [name] Matthew [name] 06/18/2023 11:30 A M Medical Record Number: [id number] Patient Account Number: [id number] Date of Birth/Sex: Treating RN: [name][birth year] (68 y.o. NETTY [name] Primary Care Severin Bou: [name] Other Clinician: DENORRIS, REUST ([id number]) [id number] Page 3 of 12 Referring Sydny Schnitzler: Treating Blakeleigh Domek/Extender: Bethena Andre [name] [name]: 49 Encounter Discharge Information Items Post Procedure Vitals Discharge Condition: Stable Temperature (F): 97.9 Ambulatory Status: Cane Pulse (bpm): 65 Discharge Destination: Home Respiratory Rate (breaths/min): 18 Transportation: Private Auto Blood Pressure (mmHg): 125/79 Accompanied By: self Schedule Follow-up Appointment: Yes Clinical Summary of Care: Electronic Signature(s) Signed: 06/23/2023 5:49:46 PM By: Purcell Sniff Entered By: Purcell Sniff on 06/18/2023 13:04:21 -------------------------------------------------------------------------------- Lower Extremity Assessment Details Patient Name: Date of Service: [name] [name] 06/18/2023 11:30 A M Medical Record Number: [id number] Patient Account Number: [id number] Date of Birth/Sex: Treating RN: [birth year][name] (68 y.o. NETTY [name] Primary Care Alezandra Egli: [name] Other Clinician: Referring Zayanna Pundt: Treating Shakisha Abend/Extender: Bethena Andre [name] [name]: 49 Edema  Assessment Assessed: Colletta: Yes] Glenis: Yes] Edema: [Left: Yes] [Right: Yes] Calf Left: Right: Point of Measurement: 36 cm From Medial Instep 58.5 cm 64.5 cm Ankle Left: Right: Point of Measurement: 13 cm From Medial Instep 30.5 cm 32 cm Vascular Assessment Pulses: Dorsalis Pedis Palpable: [Left:Yes] [Right:Yes] Electronic Signature(s) Signed: 06/18/2023 12:54:59 PM By: Matthew Blossom MSN RN CNS WTA Signed: 06/23/2023 5:49:46 PM By: Purcell Sniff Entered By: Purcell Sniff on 06/18/2023 12:29:12 CYRENA CARLIN PARAS ([id number]) [id number] Page 4 of 12 -------------------------------------------------------------------------------- Multi Wound Chart Details Patient Name: Date of Service: [name] [name] 06/18/2023 11:30 A M Medical Record Number: [id number] Patient Account Number: [id number] Date of Birth/Sex: Treating RN: [birth year][name] (68 y.o. NETTY [name] Primary Care Payton Moder: [name] Other Clinician: Referring Jamy Cleckler: Treating Strummer Canipe/Extender: Bethena Andre [name] [name]: 48 Vital Signs Height(in): 73 Pulse(bpm): 65 Weight(lbs): 358 Blood Pressure(mmHg): 125/79 Body Mass Index(BMI): 47.2 Temperature(F): 97.9 Respiratory Rate(breaths/min): 18 [3:Photos:] Right Lower Leg Left, Posterior Lower Leg Right, Distal, Lateral Lower Leg Wound Location: Gradually Appeared Blister Blister Wounding Event: Lymphedema Lymphedema Venous Leg Ulcer Primary Etiology: Arrhythmia, Congestive Heart Failure, Arrhythmia, Congestive Heart Failure, Arrhythmia, Congestive Heart Failure, Comorbid History: Hypertension, Type II Diabetes Hypertension, Type II Diabetes Hypertension, Type II Diabetes 02/04/2023 05/27/2023 05/27/2023 Date Acquired: 19 3 3  Weeks of [name]: Open Open Open Wound Status: No No No Wound Recurrence: 0.9x1.3x0.2 4.5x9x0.1 0.8x1.5x0.1 Measurements L x W x D (cm) 0.919 31.809 0.942 A (cm) : rea 0.184 3.181  0.094 Volume (cm) : 25.00% -743.70% -139.70% % Reduction in Area: 24.90% -743.80% -141.00% % Reduction in Volume: Partial Thickness Full Thickness Without Exposed Full Thickness Without Exposed Classification: Support Structures Support Structures Medium Medium Medium Exudate Amount: Serosanguineous Serosanguineous Serosanguineous Exudate Type: red, brown red, brown red, brown Exudate Color: Wound Number: 8 N/A N/A Photos: N/A N/A Right, Plantar Foot N/A N/A Wound Location: Skin Tear/Laceration N/A N/A Wounding Event: Skin Tear N/A N/A Primary Etiology: Arrhythmia, Congestive Heart Failure, N/A N/A Comorbid History: Hypertension, Type II Diabetes 06/01/2023 N/A N/A Date Acquired: 2 N/A N/A Weeks of [name]: Open N/A N/A Wound Status: No N/A N/A Wound Recurrence: GALILEO, [name] ([id number]) [id number].pdf Page 5  of 12 0.1x0.1x0.1 N/A N/A Measurements L x W x D (cm) 0.008 N/A N/A A (cm) : rea 0.001 N/A N/A Volume (cm) : 99.70% N/A N/A % Reduction in A rea: 99.70% N/A N/A % Reduction in Volume: Partial Thickness N/A N/A Classification: Small N/A N/A Exudate A mount: Serosanguineous N/A N/A Exudate Type: red, brown N/A N/A Exudate Color: [name] Notes Electronic Signature(s) Signed: 06/23/2023 5:49:46 PM By: Purcell Sniff Entered By: Purcell Sniff on 06/18/2023 12:29:18 -------------------------------------------------------------------------------- Multi-Disciplinary Care Plan Details Patient Name: Date of Service: [name] Matthew [name] 06/18/2023 11:30 A M Medical Record Number: [id number] Patient Account Number: [id number] Date of Birth/Sex: Treating RN: Jul [birth year], [name] (68 y.o. NETTY [name] Primary Care Dalaya Suppa: Rexanne [name] Other Clinician: Referring Britton Perkinson: Treating Lochlyn Zullo/Extender: Bethena Andre [name] [name]: 49 Active Inactive Venous Leg Ulcer Nursing Diagnoses: Actual venous Insuffiency (use  after diagnosis is confirmed) Goals: Patient will maintain optimal edema control Date Initiated: 10/15/2022 Target Resolution Date: 06/12/2023 Goal Status: Active Patient/caregiver will verbalize understanding of disease process and disease management Date Initiated: 10/15/2022 Date Inactivated: 11/12/2022 Target Resolution Date: 10/15/2022 Goal Status: Met Verify adequate tissue perfusion prior to therapeutic compression application Date Initiated: 10/15/2022 Date Inactivated: 11/12/2022 Target Resolution Date: 10/15/2022 Goal Status: Met Interventions: Assess peripheral edema status every visit. Compression as ordered Provide education on venous insufficiency [name] Activities: Therapeutic compression applied : 10/15/2022 Notes: Electronic Signature(s) Signed: 06/18/2023 12:54:59 PM By: Matthew Blossom MSN RN CNS WTA Signed: 06/23/2023 5:49:46 PM By: Purcell Sniff Entered By: Purcell Sniff on 06/18/2023 12:43:07 CYRENA CARLIN PARAS ([id number]) [id number] Page 6 of 12 -------------------------------------------------------------------------------- Pain Assessment Details Patient Name: Date of Service: [name] [name] 06/18/2023 11:30 A M Medical Record Number: [id number] Patient Account Number: [id number] Date of Birth/Sex: Treating RN: [birth year] (68 y.o. NETTY [name] Primary Care Yuto Cajuste: [name] Other Clinician: Referring Lucky Trotta: Treating Jullien Granquist/Extender: Bethena Andre [name] [name]: 48 Active Problems Location of Pain Severity and Description of Pain Patient Has Paino Yes Site Locations Pain Location: Pain in Ulcers Duration of the Pain. Constant / Intermittento Constant Rate the pain. Current Pain Level: 7 Character of Pain Describe the Pain: Burning, Other: stinging Pain Management and Medication Current Pain Management: Medication: Yes Cold Application: No Rest: No Massage: No Activity: No T.E.N.S.: No Heat  Application: No Leg drop or elevation: No Is the Current Pain Management Adequate: Inadequate How does your wound impact your activities of daily livingo Sleep: No Bathing: No Appetite: No Relationship With Others: No Bladder Continence: No Emotions: No Bowel Continence: No Work: No Toileting: No Drive: No Dressing: No Hobbies: No Electronic Signature(s) Signed: 06/18/2023 12:54:59 PM By: Matthew Blossom MSN RN CNS WTA Signed: 06/23/2023 5:49:46 PM By: Purcell Sniff Entered By: Purcell Sniff on 06/18/2023 12:16:21 CYRENA CARLIN PARAS ([id number]) [id number] Page 7 of 12 -------------------------------------------------------------------------------- Patient/Caregiver Education Details Patient Name: Date of Service: [name] [name] 1/3/2025andnbsp11:30 A M Medical Record Number: [id number] Patient Account Number: [id number] Date of Birth/Gender: Treating RN: [name][birth year] (68 y.o. NETTY [name] Primary Care Physician: [name] Other Clinician: Referring Physician: Treating Physician/Extender: Bethena Andre [name] [name]: 37 Education Assessment Education Provided To: Patient Education Topics Provided Venous: Handouts: Controlling Swelling with Compression Stockings Methods: Explain/Verbal Responses: State content correctly Electronic Signature(s) Signed: 06/23/2023 5:49:46 PM By: Purcell Sniff Entered By: Purcell Sniff on 06/18/2023 13:01:21 -------------------------------------------------------------------------------- Wound Assessment Details Patient Name: Date of Service: [name] Matthew [name] 06/18/2023 11:30 A M Medical Record Number: [id number]  Patient Account Number: [id number] Date of Birth/Sex: Treating RN: [name][birth year] (68 y.o. NETTY [name] Primary Care Raelin Pixler: [name] Other Clinician: Referring Kaiyla Stahly: Treating Armonie Mettler/Extender: Bethena Andre [name] [name]: 48 Wound Status Wound  Number: 3 Primary Lymphedema Etiology: Wound Location: Right Lower Leg Wound Status: Open Wounding Event: Gradually Appeared Comorbid Arrhythmia, Congestive Heart Failure, Hypertension, Type II Date Acquired: 02/04/2023 History: Diabetes Weeks Of [name]: 19 Clustered Wound: No Photos ABDIAZIZ, [name] ([id number]) [phone].pdf Page 8 of 12 Wound Measurements Length: (cm) 0.9 Width: (cm) 1.3 Depth: (cm) 0.2 Area: (cm) 0.919 Volume: (cm) 0.184 % Reduction in Area: 25% % Reduction in Volume: 24.9% Wound Description Classification: Partial Thickness Exudate Amount: Medium Exudate Type: Serosanguineous Exudate Color: red, brown [name] Notes Wound #3 (Lower Leg) Wound Laterality: Right Cleanser Soap and Water Discharge Instruction: Gently cleanse wound with antibacterial soap, rinse and pat dry prior to dressing wounds Peri-Wound Care Topical Primary Dressing Silvercel Small 2x2 (in/in) Discharge Instruction: Apply Silvercel Small 2x2 (in/in) as instructed Secondary Dressing ABD Pad 5x9 (in/in) Discharge Instruction: Cover with ABD pad Secured With Compression Wrap Compression Stockings Add-Ons Electronic Signature(s) Signed: 06/18/2023 12:54:59 PM By: Matthew Blossom MSN RN CNS WTA Signed: 06/23/2023 5:49:46 PM By: Purcell Sniff Entered By: Purcell Sniff on 06/18/2023 12:26:32 -------------------------------------------------------------------------------- Wound Assessment Details Patient Name: Date of Service: [name], [name] 06/18/2023 11:30 A CHRISTELLA CYRENA CARLIN PARAS ([id number]) [id number] Page 9 of 12 Medical Record Number: [id number] Patient Account Number: [id number] Date of Birth/Sex: Treating RN: [birth year][name] (68 y.o. NETTY [name] Primary Care Bobette Leyh: [name] Other Clinician: Referring Manuella Blackson: Treating Breianna Delfino/Extender: Bethena Andre [name] [name]: 48 Wound Status Wound Number: 6  Primary Lymphedema Etiology: Wound Location: Left, Posterior Lower Leg Wound Status: Open Wounding Event: Blister Comorbid Arrhythmia, Congestive Heart Failure, Hypertension, Type II Date Acquired: 05/27/2023 History: Diabetes Weeks Of [name]: 3 Clustered Wound: No Photos Wound Measurements Length: (cm) 0.7 Width: (cm) 0.5 Depth: (cm) 0.1 Area: (cm) 0.275 Volume: (cm) 0.027 % Reduction in Area: 92.7% % Reduction in Volume: 92.8% Wound Description Classification: Full Thickness Without Exposed Support Exudate Amount: Medium Exudate Type: Serosanguineous Exudate Color: red, brown Structures [name] Notes Wound #6 (Lower Leg) Wound Laterality: Left, Posterior Cleanser Soap and Water Discharge Instruction: Gently cleanse wound with antibacterial soap, rinse and pat dry prior to dressing wounds Peri-Wound Care Topical Primary Dressing Silvercel Small 2x2 (in/in) Discharge Instruction: Apply Silvercel Small 2x2 (in/in) as instructed Secondary Dressing ABD Pad 5x9 (in/in) Discharge Instruction: Cover with ABD pad Secured With Compression Wrap Compression Stockings Add-Ons Electronic Signature(s) Signed: 06/18/2023 12:54:59 PM By: Matthew Blossom MSN RN CNS WTA Signed: 06/23/2023 5:49:46 PM By: Purcell Sniff Entered By: Purcell Sniff on 06/18/2023 12:40:09 CYRENA CARLIN PARAS ([id number]) [id number] Page 10 of 12 -------------------------------------------------------------------------------- Wound Assessment Details Patient Name: Date of Service: [name] [name] 06/18/2023 11:30 A M Medical Record Number: [id number] Patient Account Number: [id number] Date of Birth/Sex: Treating RN: [name][birth year] (68 y.o. NETTY [name] Primary Care Aalaya Yadao: [name] Other Clinician: Referring Amneet Cendejas: Treating Dewaun Kinzler/Extender: Bethena Andre [name] [name]: 48 Wound Status Wound Number: 7 Primary Venous Leg Ulcer Etiology: Wound  Location: Right, Distal, Lateral Lower Leg Wound Status: Open Wounding Event: Blister Comorbid Arrhythmia, Congestive Heart Failure, Hypertension, Type II Date Acquired: 05/27/2023 History: Diabetes Weeks Of [name]: 3 Clustered Wound: No Photos Wound Measurements Length: (cm) 0.8 Width: (cm) 1.5 Depth: (cm) 0.1 Area: (cm) 0.942 Volume: (cm) 0.094 % Reduction in Area: -139.7% %  Reduction in Volume: -141% Wound Description Classification: Full Thickness Without Exposed Support Exudate Amount: Medium Exudate Type: Serosanguineous Exudate Color: red, brown Structures [name] Notes Wound #7 (Lower Leg) Wound Laterality: Right, Lateral, Distal Cleanser Soap and Water Discharge Instruction: Gently cleanse wound with antibacterial soap, rinse and pat dry prior to dressing wounds Peri-Wound Care Topical Primary Dressing Silvercel Small 2x2 (in/in) Discharge Instruction: Apply Silvercel Small 2x2 (in/in) as instructed Secondary Dressing ABD Pad 5x9 (in/in) ALFRED, [name] ([id number]) [phone].pdf Page 11 of 12 Discharge Instruction: Cover with ABD pad Secured With Compression Wrap Compression Stockings Add-Ons Electronic Signature(s) Signed: 06/18/2023 12:54:59 PM By: Matthew Blossom MSN RN CNS WTA Signed: 06/23/2023 5:49:46 PM By: Purcell Sniff Entered By: Purcell Sniff on 06/18/2023 12:27:35 -------------------------------------------------------------------------------- Wound Assessment Details Patient Name: Date of Service: [name] Matthew [name] 06/18/2023 11:30 A M Medical Record Number: [id number] Patient Account Number: [id number] Date of Birth/Sex: Treating RN: 31-Matthew-[name] (68 y.o. NETTY [name] Primary Care Garold Sheeler: [name] Other Clinician: Referring Sandia Pfund: Treating Noah Pelaez/Extender: Bethena Andre [name] [name]: 48 Wound Status Wound Number: 8 Primary Skin Tear Etiology: Wound Location: Right, Plantar  Foot Wound Status: Healed - Epithelialized Wounding Event: Skin Tear/Laceration Comorbid Arrhythmia, Congestive Heart Failure, Hypertension, Type II Date Acquired: 06/01/2023 History: Diabetes Weeks Of [name]: 2 Clustered Wound: No Photos Wound Measurements Length: (cm) Width: (cm) Depth: (cm) Area: (cm) Volume: (cm) 0 % Reduction in Area: 100% 0 % Reduction in Volume: 100% 0 0 0 Wound Description Classification: Partial Thickness Exudate Amount: Small Exudate Type: Serosanguineous Exudate Color: red, brown [name] Notes JEFFORY, [name] ([id number]) [phone].pdf Page 12 of 12 Wound #8 (Foot) Wound Laterality: Plantar, Right Cleanser Peri-Wound Care Topical Primary Dressing Secondary Dressing Secured With Compression Wrap Compression Stockings Add-Ons Electronic Signature(s) Signed: 06/18/2023 12:54:59 PM By: Matthew Blossom MSN RN CNS WTA Signed: 06/23/2023 5:49:46 PM By: Purcell Sniff Entered By: Purcell Sniff on 06/18/2023 12:38:44 -------------------------------------------------------------------------------- Vitals Details Patient Name: Date of Service: [name], [name] 06/18/2023 11:30 A M Medical Record Number: [id number] Patient Account Number: [id number] Date of Birth/Sex: Treating RN: [name][birth year] (68 y.o. NETTY [name] Primary Care Dietrick Barris: [name] Other Clinician: Referring Keiri Solano: Treating Viki Carrera/Extender: Bethena Andre [name] [name]: 48 Vital Signs Time Taken: 12:11 Temperature (F): 97.9 Height (in): 73 Pulse (bpm): 65 Weight (lbs): 358 Respiratory Rate (breaths/min): 18 Body Mass Index (BMI): 47.2 Blood Pressure (mmHg): 125/79 Reference Range: 80 - 120 mg / dl Electronic Signature(s) Signed: 06/23/2023 5:49:46 PM By: Purcell Sniff Entered By: Purcell Sniff on 06/18/2023 12:16:16

## 2023-06-28 ENCOUNTER — Ambulatory Visit: Payer: PPO | Admitting: Physician Assistant

## 2023-07-02 ENCOUNTER — Encounter: Payer: PPO | Admitting: Physician Assistant

## 2023-07-02 DIAGNOSIS — E11622 Type 2 diabetes mellitus with other skin ulcer: Secondary | ICD-10-CM | POA: Diagnosis not present

## 2023-07-02 NOTE — Progress Notes (Addendum)
[name] ([id number]) [id number] Page 1 of 10 Visit Report for 07/02/2023 Chief Complaint Document Details Patient Name: Date of Service: [name] 07/02/2023 10:45 A M Medical Record Number: [id number] Patient Account Number: [id number] Date of Birth/Sex: Treating RN: 1956/05/28 (67 y.o. [name]) Yevonne Pax Primary Care Provider: [name] Other Clinician: Referring Provider: Treating Provider/Extender: Matthew Folks in Treatment: 50 Information Obtained from: Patient Chief Complaint Bilateral LE ulcers and lymphedema Electronic Signature(s) Signed: 07/02/2023 10:40:53 AM By: Allen Derry PA-C Entered By: Allen Derry on 07/02/2023 10:40:53 -------------------------------------------------------------------------------- Debridement Details Patient Name: Date of Service: Matthew Burly [name] 07/02/2023 10:45 A M Medical Record Number: [id number] Patient Account Number: [id number] Date of Birth/Sex: Treating RN: [birth year] (68 y.o. [name] Primary Care Provider: [name] Other Clinician: Referring Provider: Treating Provider/Extender: Matthew Folks in Treatment: 50 Debridement Performed for Assessment: Wound #3 Right Lower Leg Performed By: Physician Allen Derry, PA-C The following information was scribed by: Angelina Pih The information was scribed for: Allen Derry Debridement Type: Debridement Level of Consciousness (Pre-procedure): Awake and Alert Pre-procedure Verification/Time Out Yes - 11:29 Taken: Pain Control: Lidocaine 4% T opical Solution Percent of Wound Bed Debrided: 100% T Area Debrided (cm): otal 1.22 Tissue and other material debrided: Viable, Non-Viable, Slough, Subcutaneous, Slough Level: Skin/Subcutaneous Tissue Debridement Description: Excisional Instrument: Curette Bleeding: Minimum Hemostasis Achieved: Pressure Response to Treatment: Procedure was  tolerated well Level of Consciousness Arlie SolomonsJARMEL, KMETZ ([id number]) [id number] Page 2 of 10 Level of Consciousness (Post- Awake and Alert procedure): Post Debridement Measurements of Total Wound Length: (cm) 1.3 Width: (cm) 1.2 Depth: (cm) 0.1 Volume: (cm) 0.123 Character of Wound/Ulcer Post Debridement: Stable Post Procedure Diagnosis Same as Pre-procedure Electronic Signature(s) Signed: 07/02/2023 11:59:08 AM By: Angelina Pih Signed: 07/02/2023 12:54:04 PM By: Allen Derry PA-C Entered By: Angelina Pih on 07/02/2023 11:30:50 -------------------------------------------------------------------------------- Debridement Details Patient Name: Date of Service: Matthew Burly [name] 07/02/2023 10:45 A M Medical Record Number: [id number] Patient Account Number: [id number] Date of Birth/Sex: Treating RN: May 05, 1956 (68 y.o. [name] Primary Care Provider: [name] Other Clinician: Referring Provider: Treating Provider/Extender: Matthew Folks in Treatment: 50 Debridement Performed for Assessment: Wound #7 Right,Distal,Lateral Lower Leg Performed By: Physician Allen Derry, PA-C The following information was scribed by: Angelina Pih The information was scribed for: Allen Derry Debridement Type: Debridement Severity of Tissue Pre Debridement: Fat layer exposed Level of Consciousness (Pre-procedure): Awake and Alert Pre-procedure Verification/Time Out Yes - 11:31 Taken: Pain Control: Lidocaine 4% T opical Solution Percent of Wound Bed Debrided: 100% T Area Debrided (cm): otal 0.85 Tissue and other material debrided: Viable, Non-Viable, Slough, Subcutaneous, Slough Level: Skin/Subcutaneous Tissue Debridement Description: Excisional Instrument: Curette Bleeding: Minimum Hemostasis Achieved: Pressure Response to Treatment: Procedure was tolerated well Level of Consciousness (Post- Awake and  Alert procedure): Post Debridement Measurements of Total Wound Length: (cm) 1.2 Width: (cm) 0.9 Depth: (cm) 0.1 Volume: (cm) 0.085 Character of Wound/Ulcer Post Debridement: Stable Severity of Tissue Post Debridement: Fat layer exposed Post Procedure Diagnosis Same as Pre-procedure Electronic Signature(s) KIING, GROTTE ([id number]) [id number] Page 3 of 10 Signed: 07/02/2023 11:59:08 AM By: Angelina Pih Signed: 07/02/2023 12:54:04 PM By: Allen Derry PA-C Entered By: Angelina Pih on 07/02/2023 11:31:51 -------------------------------------------------------------------------------- Debridement Details Patient Name: Date of Service: Matthew Holm, [name] 07/02/2023 10:45 A M Medical Record Number: [id number] Patient Account Number: [id number] Date of Birth/Sex: Treating RN: 07-18-1955 (68 y.o. [name] Primary  Care Provider: [name] Other Clinician: Referring Provider: Treating Provider/Extender: Matthew Folks in Treatment: 50 Debridement Performed for Assessment: Wound #6 Left,Posterior Lower Leg Performed By: Physician Allen Derry, PA-C The following information was scribed by: Angelina Pih The information was scribed for: Allen Derry Debridement Type: Debridement Level of Consciousness (Pre-procedure): Awake and Alert Pre-procedure Verification/Time Out Yes - 11:29 Taken: Pain Control: Lidocaine 4% T opical Solution Percent of Wound Bed Debrided: 100% T Area Debrided (cm): otal 0.55 Tissue and other material debrided: Viable, Non-Viable, Slough, Subcutaneous, Slough Level: Skin/Subcutaneous Tissue Debridement Description: Excisional Instrument: Curette Bleeding: Minimum Hemostasis Achieved: Pressure Response to Treatment: Procedure was tolerated well Level of Consciousness (Post- Awake and Alert procedure): Post Debridement Measurements of Total Wound Length: (cm) 1 Width: (cm) 0.7 Depth: (cm)  0.1 Volume: (cm) 0.055 Character of Wound/Ulcer Post Debridement: Stable Post Procedure Diagnosis Same as Pre-procedure Electronic Signature(s) Signed: 07/02/2023 11:59:08 AM By: Angelina Pih Signed: 07/02/2023 12:54:04 PM By: Allen Derry PA-C Entered By: Angelina Pih on 07/02/2023 11:33:15 [name] ([id number]) [id number] Page 4 of 10 -------------------------------------------------------------------------------- HPI Details Patient Name: Date of Service: [name] 07/02/2023 10:45 A M Medical Record Number: [id number] Patient Account Number: [id number] Date of Birth/Sex: Treating RN: 1955/12/09 (67 y.o. [name]) Yevonne Pax Primary Care Provider: [name] Other Clinician: Referring Provider: Treating Provider/Extender: Matthew Folks in Treatment: 50 History of Present Illness HPI Description: 07-16-2022 upon evaluation today patient appears to be doing poorly currently in regard to his his legs. The left leg is actually worse than the right. This is a patient that is previously been seen in the [facility] office and at that point was doing really quite well with compression wrapping and often alginate are either Hydrofera Blue dressings. Fortunately he has gone since November 2020 until just current with out having any issue or need for wound care services which is great news. Unfortunately he is here today because he is having some issues at this point. Patient does have a history of several medical conditions which include diabetes mellitus type 2, lymphedema, hypertension, congestive heart failure, and cardiac arrhythmia though is not sure that this is actually atrial fibrillation based on what he has been told. He does have lymphedema pumps but tells me he has not used them since the summer when he had to move to a remodel of his building and subsequently never unpacked them. 07-02-2023 upon evaluation today patient  appears to be doing decently well in regard to his wounds. He has been taught rating the dressing changes without complication and in general I do believe that were making good headway here towards closure. I do not see any signs of infection he seems to be making pretty good progress here. Electronic Signature(s) Signed: 07/02/2023 12:34:53 PM By: Allen Derry PA-C Entered By: Allen Derry on 07/02/2023 12:34:53 -------------------------------------------------------------------------------- Physical Exam Details Patient Name: Date of Service: [name] 07/02/2023 10:45 A M Medical Record Number: [id number] Patient Account Number: [id number] Date of Birth/Sex: Treating RN: [birth year] (68 y.o. [name] Primary Care Provider: [name] Other Clinician: Referring Provider: Treating Provider/Extender: Matthew Folks in Treatment: 50 Constitutional Well-nourished and well-hydrated in no acute distress. Respiratory normal breathing without difficulty. Psychiatric this patient is able to make decisions and demonstrates good insight into disease process. Alert and Oriented x 3. pleasant and cooperative. Notes Upon inspection patient's wound bed actually showed signs of good granulation and epithelization at this  point. Fortunately I do not see any signs of worsening overall and I feel the patient is making good headway here towards closure which is great news he seems to be doing excellent at this point. Electronic Signature(s) Signed: 07/02/2023 12:35:12 PM By: Allen Derry PA-C Entered By: Allen Derry on 07/02/2023 12:35:12 [name] ([id number]) [id number] Page 5 of 10 -------------------------------------------------------------------------------- Physician Orders Details Patient Name: Date of Service: [name] 07/02/2023 10:45 A M Medical Record Number: [id number] Patient Account Number: [id number] Date  of Birth/Sex: Treating RN: 09/24/1955 (67 y.o. [name] Primary Care Provider: [name] Other Clinician: Referring Provider: Treating Provider/Extender: Matthew Folks in Treatment: 69 The following information was scribed by: Angelina Pih The information was scribed for: Allen Derry Verbal / Phone Orders: No Diagnosis Coding ICD-10 Coding Code Description E11.622 Type 2 diabetes mellitus with other skin ulcer I89.0 Lymphedema, not elsewhere classified L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 L97.812 Non-pressure chronic ulcer of other part of right lower leg with fat layer exposed I10 Essential (primary) hypertension I50.42 Chronic combined systolic (congestive) and diastolic (congestive) heart failure I49.9 Cardiac arrhythmia, unspecified Follow-up Appointments Return Appointment in 1 week. Nurse Visit as needed - Nurse visit Monday (12/23) Friday (12/27) Monday (12/30) Thursday (1/2) Bathing/ Shower/ Hygiene May shower with wound dressing protected with water repellent cover or cast protector. Anesthetic (Use 'Patient Medications' Section for Anesthetic Order Entry) Lidocaine applied to wound bed Edema Control - Orders / Instructions Elevate, Exercise Daily and A void Standing for Long Periods of Time. Patient to wear own Velcro compression garment. Remove compression stockings every night before going to bed and put on every morning when getting up. - juxtalite bilat lower legs Elevate legs to the level of the heart and pump ankles as often as possible Elevate leg(s) parallel to the floor when sitting. DO YOUR BEST to sleep in the bed at night. DO NOT sleep in your recliner. Long hours of sitting in a recliner leads to swelling of the legs and/or potential wounds on your backside. Wound Treatment Wound #3 - Lower Leg Wound Laterality: Right Cleanser: Soap and Water 2 x Per Week/30  Days Discharge Instructions: Gently cleanse wound with antibacterial soap, rinse and pat dry prior to dressing wounds Prim Dressing: Silvercel Small 2x2 (in/in) 2 x Per Week/30 Days ary Discharge Instructions: Apply Silvercel Small 2x2 (in/in) as instructed Secondary Dressing: ABD Pad 5x9 (in/in) 2 x Per Week/30 Days Discharge Instructions: Cover with ABD pad Wound #6 - Lower Leg Wound Laterality: Left, Posterior Cleanser: Soap and Water 2 x Per Week/30 Days Discharge Instructions: Gently cleanse wound with antibacterial soap, rinse and pat dry prior to dressing wounds Prim Dressing: Silvercel Small 2x2 (in/in) 2 x Per Week/30 Days ary Discharge Instructions: Apply Silvercel Small 2x2 (in/in) as instructed Secondary Dressing: ABD Pad 5x9 (in/in) 2 x Per Week/30 Days Discharge Instructions: Cover with ABD pad ERIKSON, WRAGG ([id number]) [id number] Page 6 of 10 Wound #7 - Lower Leg Wound Laterality: Right, Lateral, Distal Cleanser: Soap and Water 2 x Per Week/30 Days Discharge Instructions: Gently cleanse wound with antibacterial soap, rinse and pat dry prior to dressing wounds Prim Dressing: Silvercel Small 2x2 (in/in) 2 x Per Week/30 Days ary Discharge Instructions: Apply Silvercel Small 2x2 (in/in) as instructed Secondary Dressing: ABD Pad 5x9 (in/in) 2 x Per Week/30 Days Discharge Instructions: Cover with ABD pad Electronic Signature(s) Signed: 07/02/2023 11:59:08 AM By: Roger Shelter,  Caitlin Signed: 07/02/2023 12:54:04 PM By: Allen Derry PA-C Entered By: Angelina Pih on 07/02/2023 11:49:22 -------------------------------------------------------------------------------- Problem List Details Patient Name: Date of Service: Matthew Burly [name] 07/02/2023 10:45 A M Medical Record Number: [id number] Patient Account Number: [id number] Date of Birth/Sex: Treating RN: 1956/04/02 (67 y.o. [name]) Yevonne Pax Primary Care Provider: [name] Other  Clinician: Referring Provider: Treating Provider/Extender: Matthew Folks in Treatment: 41 Active Problems ICD-10 Encounter Code Description Active Date MDM Diagnosis E11.622 Type 2 diabetes mellitus with other skin ulcer 07/16/2022 No Yes I89.0 Lymphedema, not elsewhere classified 07/16/2022 No Yes L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2/06/2022 No Yes L97.812 Non-pressure chronic ulcer of other part of right lower leg with fat layer 07/16/2022 No Yes exposed I10 Essential (primary) hypertension 07/16/2022 No Yes I50.42 Chronic combined systolic (congestive) and diastolic (congestive) heart failure 07/16/2022 No Yes I49.9 Cardiac arrhythmia, unspecified 07/16/2022 No Yes BRAXEN, BOATNER ([id number]) [id number].pdf Page 7 of 10 Inactive Problems Resolved Problems Electronic Signature(s) Signed: 07/02/2023 10:40:50 AM By: Allen Derry PA-C Entered By: Allen Derry on 07/02/2023 10:40:50 -------------------------------------------------------------------------------- Progress Note Details Patient Name: Date of Service: [name], [name] 07/02/2023 10:45 A M Medical Record Number: [id number] Patient Account Number: [id number] Date of Birth/Sex: Treating RN: [birth year] (68 y.o. [name] Primary Care Provider: [name] Other Clinician: Referring Provider: Treating Provider/Extender: Matthew Folks in Treatment: 50 Subjective Chief Complaint Information obtained from Patient Bilateral LE ulcers and lymphedema History of Present Illness (HPI) 07-16-2022 upon evaluation today patient appears to be doing poorly currently in regard to his his legs. The left leg is actually worse than the right. This is a patient that is previously been seen in the [facility] office and at that point was doing really quite well with compression wrapping and often alginate are either Hydrofera  Blue dressings. Fortunately he has gone since November 2020 until just current with out having any issue or need for wound care services which is great news. Unfortunately he is here today because he is having some issues at this point. Patient does have a history of several medical conditions which include diabetes mellitus type 2, lymphedema, hypertension, congestive heart failure, and cardiac arrhythmia though is not sure that this is actually atrial fibrillation based on what he has been told. He does have lymphedema pumps but tells me he has not used them since the summer when he had to move to a remodel of his building and subsequently never unpacked them. 07-02-2023 upon evaluation today patient appears to be doing decently well in regard to his wounds. He has been taught rating the dressing changes without complication and in general I do believe that were making good headway here towards closure. I do not see any signs of infection he seems to be making pretty good progress here. Objective Constitutional Well-nourished and well-hydrated in no acute distress. Vitals Time Taken: 10:42 AM, Height: 73 in, Weight: 358 lbs, BMI: 47.2, Temperature: 97.9 F, Pulse: 68 bpm, Respiratory Rate: 20 breaths/min, Blood Pressure: 125/67 mmHg. Respiratory normal breathing without difficulty. Psychiatric this patient is able to make decisions and demonstrates good insight into disease process. Alert and Oriented x 3. pleasant and cooperative. General Notes: Upon inspection patient's wound bed actually showed signs of good granulation and epithelization at this point. Fortunately I do not see any signs of worsening overall and I feel the patient is making good headway here towards closure  which is great news he seems to be doing excellent at this point. Integumentary (Hair, Skin) Wound #3 status is Open. Original cause of wound was Gradually Appeared. The date acquired was: 02/04/2023. The wound has been in  treatment 21 weeks. BLANCHARD, ALESI ([id number]) [id number] Page 8 of 10 The wound is located on the Right Lower Leg. The wound measures 1.3cm length x 1.2cm width x 0.1cm depth; 1.225cm^2 area and 0.123cm^3 volume. There is Fat Layer (Subcutaneous Tissue) exposed. There is no tunneling or undermining noted. There is a medium amount of serosanguineous drainage noted. There is medium (34-66%) red, pink granulation within the wound bed. There is a medium (34-66%) amount of necrotic tissue within the wound bed including Adherent Slough. Wound #6 status is Open. Original cause of wound was Blister. The date acquired was: 05/27/2023. The wound has been in treatment 5 weeks. The wound is located on the Left,Posterior Lower Leg. The wound measures 1cm length x 0.7cm width x 0.1cm depth; 0.55cm^2 area and 0.055cm^3 volume. There is Fat Layer (Subcutaneous Tissue) exposed. There is no tunneling or undermining noted. There is a medium amount of serosanguineous drainage noted. There is medium (34-66%) red, pink granulation within the wound bed. There is a medium (34-66%) amount of necrotic tissue within the wound bed including Adherent Slough. Wound #7 status is Open. Original cause of wound was Blister. The date acquired was: 05/27/2023. The wound has been in treatment 5 weeks. The wound is located on the Right,Distal,Lateral Lower Leg. The wound measures 1.2cm length x 0.9cm width x 0.1cm depth; 0.848cm^2 area and 0.085cm^3 volume. There is Fat Layer (Subcutaneous Tissue) exposed. There is no tunneling or undermining noted. There is a medium amount of serosanguineous drainage noted. There is medium (34-66%) red, pink granulation within the wound bed. There is a medium (34-66%) amount of necrotic tissue within the wound bed including Adherent Slough. Assessment Active Problems ICD-10 Type 2 diabetes mellitus with other skin ulcer Lymphedema, not elsewhere  classified Non-pressure chronic ulcer of other part of left lower leg with fat layer exposed Non-pressure chronic ulcer of other part of right lower leg with fat layer exposed Essential (primary) hypertension Chronic combined systolic (congestive) and diastolic (congestive) heart failure Cardiac arrhythmia, unspecified Procedures Wound #3 Pre-procedure diagnosis of Wound #3 is a Lymphedema located on the Right Lower Leg . There was a Excisional Skin/Subcutaneous Tissue Debridement with a total area of 1.22 sq cm performed by Allen Derry, PA-C. With the following instrument(s): Curette to remove Viable and Non-Viable tissue/material. Material removed includes Subcutaneous Tissue and Slough and after achieving pain control using Lidocaine 4% T opical Solution. No specimens were taken. A time out was conducted at 11:29, prior to the start of the procedure. A Minimum amount of bleeding was controlled with Pressure. The procedure was tolerated well. Post Debridement Measurements: 1.3cm length x 1.2cm width x 0.1cm depth; 0.123cm^3 volume. Character of Wound/Ulcer Post Debridement is stable. Post procedure Diagnosis Wound #3: Same as Pre-Procedure Wound #6 Pre-procedure diagnosis of Wound #6 is a Lymphedema located on the Left,Posterior Lower Leg . There was a Excisional Skin/Subcutaneous Tissue Debridement with a total area of 0.55 sq cm performed by Allen Derry, PA-C. With the following instrument(s): Curette to remove Viable and Non-Viable tissue/material. Material removed includes Subcutaneous Tissue and Slough and after achieving pain control using Lidocaine 4% T opical Solution. No specimens were taken. A time out was conducted at 11:29, prior to the start of the procedure. A Minimum amount of bleeding  was controlled with Pressure. The procedure was tolerated well. Post Debridement Measurements: 1cm length x 0.7cm width x 0.1cm depth; 0.055cm^3 volume. Character of Wound/Ulcer Post Debridement  is stable. 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 There was a Excisional Skin/Subcutaneous Tissue Debridement with a total area of 0.85 sq cm performed by Allen Derry, PA-C. With the following instrument(s): Curette to remove Viable and Non-Viable tissue/material. Material removed includes Subcutaneous Tissue and Slough and after achieving pain control using Lidocaine 4% T opical Solution. No specimens were taken. A time out was conducted at 11:31, prior to the start of the procedure. A Minimum amount of bleeding was controlled with Pressure. The procedure was tolerated well. Post Debridement Measurements: 1.2cm length x 0.9cm width x 0.1cm depth; 0.085cm^3 volume. Character of Wound/Ulcer Post Debridement is stable. Severity of Tissue Post Debridement is: Fat layer exposed. Post procedure Diagnosis Wound #7: Same as Pre-Procedure Plan Follow-up Appointments: Return Appointment in 1 week. Nurse Visit as needed - Nurse visit Monday (12/23) Friday (12/27) Monday (12/30) Thursday (1/2) Bathing/ Shower/ Hygiene: May shower with wound dressing protected with water repellent cover or cast protector. Anesthetic (Use 'Patient Medications' Section for Anesthetic Order Entry): Lidocaine applied to wound bed Edema Control - Orders / Instructions: Elevate, Exercise Daily and Avoid Standing for Long Periods of Time. Patient to wear own Velcro compression garment. Remove compression stockings every night before going to bed and put on every morning when getting up. - juxtalite bilat lower legs KAMI, ROTAR ([id number]) [id number] Page 9 of 10 Elevate legs to the level of the heart and pump ankles as often as possible Elevate leg(s) parallel to the floor when sitting. DO YOUR BEST to  sleep in the bed at night. DO NOT sleep in your recliner. Long hours of sitting in a recliner leads to swelling of the legs and/or potential wounds on your backside. WOUND #3: - Lower Leg Wound Laterality: Right Cleanser: Soap and Water 2 x Per Week/30 Days Discharge Instructions: Gently cleanse wound with antibacterial soap, rinse and pat dry prior to dressing wounds Prim Dressing: Silvercel Small 2x2 (in/in) 2 x Per Week/30 Days ary Discharge Instructions: Apply Silvercel Small 2x2 (in/in) as instructed Secondary Dressing: ABD Pad 5x9 (in/in) 2 x Per Week/30 Days Discharge Instructions: Cover with ABD pad WOUND #6: - Lower Leg Wound Laterality: Left, Posterior Cleanser: Soap and Water 2 x Per Week/30 Days Discharge Instructions: Gently cleanse wound with antibacterial soap, rinse and pat dry prior to dressing wounds Prim Dressing: Silvercel Small 2x2 (in/in) 2 x Per Week/30 Days ary Discharge Instructions: Apply Silvercel Small 2x2 (in/in) as instructed Secondary Dressing: ABD Pad 5x9 (in/in) 2 x Per Week/30 Days Discharge Instructions: Cover with ABD pad WOUND #7: - Lower Leg Wound Laterality: Right, Lateral, Distal Cleanser: Soap and Water 2 x Per Week/30 Days Discharge Instructions: Gently cleanse wound with antibacterial soap, rinse and pat dry prior to dressing wounds Prim Dressing: Silvercel Small 2x2 (in/in) 2 x Per Week/30 Days ary Discharge Instructions: Apply Silvercel Small 2x2 (in/in) as instructed Secondary Dressing: ABD Pad 5x9 (in/in) 2 x Per Week/30 Days Discharge Instructions: Cover with ABD pad 1. I would recommend based on what we are seeing that we have him continue to monitor for any signs of infection or worsening. Postdebridement the wound bed actually appear to be significantly better  today which was great news. I do not see any signs of worsening overall and I think that all wounds are really moving in the right direction. 2. I am also going to recommend that  the patient should continue to utilize the silver alginate dressing which I think is doing a good job here. I also recommend a ABD pad on the dressing around the edges of the wounds. He is then using Tubigrip and then his Velcro compression wraps over top of this. 3. And also can recommend the patient should continue to elevate his legs much as possible to help with edema control the more this he can do the better. We will see patient back for reevaluation in 1 week here in the clinic. If anything worsens or changes patient will contact our office for additional recommendations. Electronic Signature(s) Signed: 07/02/2023 12:37:40 PM By: Allen Derry PA-C Entered By: Allen Derry on 07/02/2023 12:37:40 -------------------------------------------------------------------------------- SuperBill Details Patient Name: Date of Service: [name], [name] 07/02/2023 Medical Record Number: [id number] Patient Account Number: [id number] Date of Birth/Sex: Treating RN: 09-04-1955 (67 y.o. [name] Primary Care Provider: [name] Other Clinician: Referring Provider: Treating Provider/Extender: Matthew Folks in Treatment: 50 Diagnosis Coding ICD-10 Codes Code Description E11.622 Type 2 diabetes mellitus with other skin ulcer I89.0 Lymphedema, not elsewhere classified L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 L97.812 Non-pressure chronic ulcer of other part of right lower leg with fat layer exposed I10 Essential (primary) hypertension I50.42 Chronic combined systolic (congestive) and diastolic (congestive) heart failure I49.9 Cardiac arrhythmia, unspecified VICTORINO, LENNON ([id number]) [id number] Page 10 of 10 Facility Procedures : CPT4 Code: 35573220 Description: 11042 - DEB SUBQ TISSUE 20 SQ CM/< ICD-10 Diagnosis Description L97.822 Non-pressure chronic ulcer of other part of left lower leg  with fat layer expo L97.812 Non-pressure chronic ulcer of other part of right lower leg with fat layer exp Modifier: sed osed Quantity: 1 Physician Procedures : CPT4 Code Description Modifier 11042 11042 - WC PHYS SUBQ TISS 20 SQ CM ICD-10 Diagnosis Description L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 L97.812 Non-pressure chronic ulcer of other part of right  lower leg with fat layer exposed Quantity: 1 Electronic Signature(s) Signed: 07/02/2023 12:37:51 PM By: Allen Derry PA-C Entered By: Allen Derry on 07/02/2023 12:37:51

## 2023-07-02 NOTE — Progress Notes (Addendum)
MARINO, HARGREAVES ([id number]) [id number] Page 1 of 11 Visit Report for 07/02/2023 Arrival Information Details Patient Name: Date of Service: [name] 07/02/2023 10:45 A M Medical Record Number: [id number] Patient Account Number: [id number] Date of Birth/Sex: Treating RN: [birth year] (68 y.o. [name] Primary Care Saher Davee: [name] Other Clinician: Referring Toccara Alford: Treating Naveah Brave/Extender: Matthew Folks in Treatment: 50 Visit Information History Since Last Visit Added or deleted any medications: No Patient Arrived: [name] Any new allergies or adverse reactions: No Arrival Time: 10:41 Had a fall or experienced change in No Accompanied By: self activities of daily living that may affect Transfer Assistance: None risk of falls: Patient Identification Verified: Yes Hospitalized since last visit: No Secondary Verification Process Completed: Yes Has Dressing in Place as Prescribed: Yes Patient Requires Transmission-Based Precautions: No Has Compression in Place as Prescribed: Yes Patient Has Alerts: No Pain Present Now: Yes Electronic Signature(s) Signed: 07/02/2023 11:59:08 AM By: Angelina Pih Entered By: Angelina Pih on 07/02/2023 10:42:07 -------------------------------------------------------------------------------- Clinic Level of Care Assessment Details Patient Name: Date of Service: [name] 07/02/2023 10:45 A M Medical Record Number: [id number] Patient Account Number: [id number] Date of Birth/Sex: Treating RN: 09-20-1955 (68 y.o. [name] Primary Care Balin Vandegrift: [name] Other Clinician: Referring Greidys Deland: Treating Keerthana Vanrossum/Extender: Matthew Folks in Treatment: 50 Clinic Level of Care Assessment Items TOOL 1 Quantity Score []  - 0 Use when EandM and Procedure is performed on INITIAL visit ASSESSMENTS - Nursing Assessment / Reassessment []  -  0 General Physical Exam (combine w/ comprehensive assessment (listed just below) when performed on new pt. evals) []  - 0 Comprehensive Assessment (HX, ROS, Risk Assessments, Wounds Hx, etc.) ASSESSMENTS - Wound and Skin Assessment / Reassessment []  - 0 Dermatologic / Skin Assessment (not related to wound area) [name], [name] ([id number]) [id number] Page 2 of 11 ASSESSMENTS - Ostomy and/or Continence Assessment and Care []  - 0 Incontinence Assessment and Management []  - 0 Ostomy Care Assessment and Management (repouching, etc.) PROCESS - Coordination of Care []  - 0 Simple Patient / Family Education for ongoing care []  - 0 Complex (extensive) Patient / Family Education for ongoing care []  - 0 Staff obtains Chiropractor, Records, T Results / Process Orders est []  - 0 Staff telephones HHA, Nursing Homes / Clarify orders / etc []  - 0 Routine Transfer to another Facility (non-emergent condition) []  - 0 Routine Hospital Admission (non-emergent condition) []  - 0 New Admissions / Manufacturing engineer / Ordering NPWT Apligraf, etc. , []  - 0 Emergency Hospital Admission (emergent condition) PROCESS - Special Needs []  - 0 Pediatric / Minor Patient Management []  - 0 Isolation Patient Management []  - 0 Hearing / Language / Visual special needs []  - 0 Assessment of Community assistance (transportation, D/C planning, etc.) []  - 0 Additional assistance / Altered mentation []  - 0 Support Surface(s) Assessment (bed, cushion, seat, etc.) INTERVENTIONS - Miscellaneous []  - 0 External ear exam []  - 0 Patient Transfer (multiple staff / Nurse, adult / Similar devices) []  - 0 Simple Staple / Suture removal (25 or less) []  - 0 Complex Staple / Suture removal (26 or more) []  - 0 Hypo/Hyperglycemic Management (do not check if billed separately) []  - 0 Ankle / Brachial Index (ABI) - do not check if billed separately Has the patient been seen at the hospital  within the last three years: Yes Total Score: 0 Level Of Care: ____ Electronic Signature(s) Signed: 07/02/2023 11:59:08 AM By: Angelina Pih  Entered By: Angelina Pih on 07/02/2023 11:49:27 -------------------------------------------------------------------------------- Encounter Discharge Information Details Patient Name: Date of Service: [name] 07/02/2023 10:45 A M Medical Record Number: [id number] Patient Account Number: [id number] Date of Birth/Sex: Treating RN: 1955/09/02 (68 y.o. [name] Primary Care Coleson Kant: [name] Other Clinician: Referring Mikayla Chiusano: Treating Quintasha Gren/Extender: Matthew Folks in Treatment: 106 Encounter Discharge Information Items Post Procedure [name], [name] ([id number]) [id number] Page 3 of 11 Discharge Condition: Stable Temperature (F): 97.9 Ambulatory Status: Cane Pulse (bpm): 68 Discharge Destination: Home Respiratory Rate (breaths/min): 20 Transportation: Private Auto Blood Pressure (mmHg): 125/67 Accompanied By: self Schedule Follow-up Appointment: Yes Clinical Summary of Care: Electronic Signature(s) Signed: 07/02/2023 11:50:57 AM By: Angelina Pih Entered By: Angelina Pih on 07/02/2023 11:50:57 -------------------------------------------------------------------------------- Lower Extremity Assessment Details Patient Name: Date of Service: [name] 07/02/2023 10:45 A M Medical Record Number: [id number] Patient Account Number: [id number] Date of Birth/Sex: Treating RN: 1956-02-22 (68 y.o. [name] Primary Care Janoah Menna: [name] Other Clinician: Referring Anapaola Kinsel: Treating Athan Casalino/Extender: Matthew Folks in Treatment: 50 Edema Assessment Assessed: [Left: No] [Right: No] Edema: [Left: Yes] [Right: Yes] Calf Left: Right: Point of Measurement: 36 cm From Medial Instep 54.5 cm 57 cm Ankle Left:  Right: Point of Measurement: 13 cm From Medial Instep 30 cm 30.5 cm Vascular Assessment Pulses: Dorsalis Pedis Palpable: [Left:Yes] [Right:Yes] Extremity colors, hair growth, and conditions: Extremity Color: [Left:Hyperpigmented] [Right:Hyperpigmented] Hair Growth on Extremity: [Left:No] [Right:No] Temperature of Extremity: [Left:Warm < 3 seconds] [Right:Warm < 3 seconds] Toe Nail Assessment Left: Right: Thick: Yes Yes Discolored: Yes Yes Deformed: Yes Yes Improper Length and Hygiene: Yes Yes Electronic Signature(s) Signed: 07/02/2023 11:59:08 AM By: Angelina Pih Entered By: Angelina Pih on 07/02/2023 11:00:09 [name] ([id number]) [id number] Page 4 of 11 -------------------------------------------------------------------------------- Multi Wound Chart Details Patient Name: Date of Service: [name] 07/02/2023 10:45 A M Medical Record Number: [id number] Patient Account Number: [id number] Date of Birth/Sex: Treating RN: 1955-07-17 (68 y.o. [name] Primary Care Yanelly Cantrelle: [name] Other Clinician: Referring Lovelle Lema: Treating Humphrey Guerreiro/Extender: Matthew Folks in Treatment: 50 Vital Signs Height(in): 73 Pulse(bpm): 68 Weight(lbs): 358 Blood Pressure(mmHg): 125/67 Body Mass Index(BMI): 47.2 Temperature(F): 97.9 Respiratory Rate(breaths/min): 20 [3:Photos:] Right Lower Leg Left, Posterior Lower Leg Right, Distal, Lateral Lower Leg Wound Location: Gradually Appeared Blister Blister Wounding Event: Lymphedema Lymphedema Venous Leg Ulcer Primary Etiology: Arrhythmia, Congestive Heart Failure, Arrhythmia, Congestive Heart Failure, Arrhythmia, Congestive Heart Failure, Comorbid History: Hypertension, Type II Diabetes Hypertension, Type II Diabetes Hypertension, Type II Diabetes 02/04/2023 05/27/2023 05/27/2023 Date Acquired: 21 5 5  Weeks of Treatment: Open Open Open Wound Status: No No  No Wound Recurrence: 1.3x1.2x0.1 1x0.7x0.1 1.2x0.9x0.1 Measurements L x W x D (cm) 1.225 0.55 0.848 A (cm) : rea 0.123 0.055 0.085 Volume (cm) : 0.00% 85.40% -115.80% % Reduction in A rea: 49.80% 85.40% -117.90% % Reduction in Volume: Partial Thickness Full Thickness Without Exposed Full Thickness Without Exposed Classification: Support Structures Support Structures Medium Medium Medium Exudate A mount: Serosanguineous Serosanguineous Serosanguineous Exudate Type: red, brown red, brown red, brown Exudate Color: Medium (34-66%) Medium (34-66%) Medium (34-66%) Granulation A mount: Red, Pink Red, Pink Red, Pink Granulation Quality: Medium (34-66%) Medium (34-66%) Medium (34-66%) Necrotic A mount: Fat Layer (Subcutaneous Tissue): Yes Fat Layer (Subcutaneous Tissue): Yes Fat Layer (Subcutaneous Tissue): Yes Exposed Structures: None None None Epithelialization: Debridement - Excisional Debridement - Excisional Debridement - Excisional Debridement: Pre-procedure Verification/Time Out 11:29 11:29  11:31 Taken: Lidocaine 4% Topical Solution Lidocaine 4% Topical Solution Lidocaine 4% Topical Solution Pain Control: Subcutaneous, Slough Subcutaneous, Slough Subcutaneous, Slough Tissue Debrided: Skin/Subcutaneous Tissue Skin/Subcutaneous Tissue Skin/Subcutaneous Tissue Level: 1.22 0.55 0.85 Debridement A (sq cm): rea Curette Curette Curette Instrument: Minimum Minimum Minimum Bleeding: Pressure Pressure Pressure Hemostasis A chieved: Procedure was tolerated well Procedure was tolerated well Procedure was tolerated well Debridement Treatment Response: 1.3x1.2x0.1 1x0.7x0.1 1.2x0.9x0.1 Post Debridement Measurements L x W x D (cm) 0.123 0.055 0.085 Post Debridement Volume: (cm) Debridement Debridement Debridement Procedures Performed: [name], [name] ([id number]) [id number].pdf Page 5 of 11 Treatment Notes Electronic Signature(s) Signed: 07/02/2023  11:48:47 AM By: Angelina Pih Entered By: Angelina Pih on 07/02/2023 11:48:47 -------------------------------------------------------------------------------- Multi-Disciplinary Care Plan Details Patient Name: Date of Service: Matthew Burly [name] 07/02/2023 10:45 A M Medical Record Number: [id number] Patient Account Number: [id number] Date of Birth/Sex: Treating RN: 1955/11/02 (68 y.o. [name] Primary Care Reeve Mallo: [name] Other Clinician: Referring Sritha Chauncey: Treating Tamiki Kuba/Extender: Matthew Folks in Treatment: 50 Active Inactive Venous Leg Ulcer Nursing Diagnoses: Actual venous Insuffiency (use after diagnosis is confirmed) Goals: Patient will maintain optimal edema control Date Initiated: 10/15/2022 Target Resolution Date: 08/13/2023 Goal Status: Active Patient/caregiver will verbalize understanding of disease process and disease management Date Initiated: 10/15/2022 Date Inactivated: 11/12/2022 Target Resolution Date: 10/15/2022 Goal Status: Met Verify adequate tissue perfusion prior to therapeutic compression application Date Initiated: 10/15/2022 Date Inactivated: 11/12/2022 Target Resolution Date: 10/15/2022 Goal Status: Met Interventions: Assess peripheral edema status every visit. Compression as ordered Provide education on venous insufficiency Treatment Activities: Therapeutic compression applied : 10/15/2022 Notes: Electronic Signature(s) Signed: 07/02/2023 11:49:45 AM By: Angelina Pih Entered By: Angelina Pih on 07/02/2023 11:49:45 [name] ([id number]) [id number] Page 6 of 11 -------------------------------------------------------------------------------- Pain Assessment Details Patient Name: Date of Service: [name] 07/02/2023 10:45 A M Medical Record Number: [id number] Patient Account Number: [id number] Date of Birth/Sex: Treating RN: [birth year] (68 y.o. [name] Primary Care Monae Topping: [name] Other Clinician: Referring Miyeko Mahlum: Treating Kerra Guilfoil/Extender: Matthew Folks in Treatment: 50 Active Problems Location of Pain Severity and Description of Pain Patient Has Paino Yes Site Locations Rate the pain. Current Pain Level: 4 Pain Management and Medication Current Pain Management: Electronic Signature(s) Signed: 07/02/2023 11:59:08 AM By: Angelina Pih Entered By: Angelina Pih on 07/02/2023 10:44:41 -------------------------------------------------------------------------------- Patient/Caregiver Education Details Patient Name: Date of Service: [name] 1/17/2025andnbsp10:45 A M Medical Record Number: [id number] Patient Account Number: [id number] Date of Birth/Gender: Treating RN: [birth year] (68 y.o. [name] Primary Care Physician: [name] Other Clinician: Referring Physician: Treating Physician/Extender: Matthew Folks in Treatment: [address], [place] ([id number]) [id number] Page 7 of 11 Education Assessment Education Provided To: Patient Education Topics Provided Venous: Handouts: Controlling Swelling with Compression Stockings Methods: Explain/Verbal Responses: State content correctly Wound Debridement: Handouts: Wound Debridement Methods: Explain/Verbal Responses: State content correctly Wound/Skin Impairment: Handouts: Caring for Your Ulcer Methods: Explain/Verbal Responses: State content correctly Electronic Signature(s) Signed: 07/02/2023 11:59:08 AM By: Angelina Pih Entered By: Angelina Pih on 07/02/2023 11:50:04 -------------------------------------------------------------------------------- Wound Assessment Details Patient Name: Date of Service: Matthew Burly [name] 07/02/2023 10:45 A M Medical Record Number: [id number] Patient Account Number: [id number] Date of Birth/Sex: Treating RN: December 27, 1955 (68  y.o. [name] Primary Care Odel Schmid: [name] Other Clinician: Referring Aren Pryde: Treating Rebekah Zackery/Extender: Matthew Folks in Treatment: 50 Wound Status Wound Number: 3 Primary Lymphedema Etiology: Wound Location: Right Lower  Leg Wound Status: Open Wounding Event: Gradually Appeared Comorbid Arrhythmia, Congestive Heart Failure, Hypertension, Type II Date Acquired: 02/04/2023 History: Diabetes Weeks Of Treatment: 21 Clustered Wound: No Photos Wound Measurements [name], [name] ([id number]) Length: (cm) 1.3 Width: (cm) 1.2 Depth: (cm) 0.1 Area: (cm) 1.225 Volume: (cm) 0.123 [id number] Page 8 of 11 % Reduction in Area: 0% % Reduction in Volume: 49.8% Epithelialization: None Tunneling: No Undermining: No Wound Description Classification: Partial Thickness Exudate Amount: Medium Exudate Type: Serosanguineous Exudate Color: red, brown Foul Odor After Cleansing: No Slough/Fibrino Yes Wound Bed Granulation Amount: Medium (34-66%) Exposed Structure Granulation Quality: Red, Pink Fat Layer (Subcutaneous Tissue) Exposed: Yes Necrotic Amount: Medium (34-66%) Necrotic Quality: Adherent Slough Treatment Notes Wound #3 (Lower Leg) Wound Laterality: Right Cleanser Soap and Water Discharge Instruction: Gently cleanse wound with antibacterial soap, rinse and pat dry prior to dressing wounds Peri-Wound Care Topical Primary Dressing Silvercel Small 2x2 (in/in) Discharge Instruction: Apply Silvercel Small 2x2 (in/in) as instructed Secondary Dressing ABD Pad 5x9 (in/in) Discharge Instruction: Cover with ABD pad Secured With Compression Wrap Compression Stockings Add-Ons Electronic Signature(s) Signed: 07/02/2023 11:59:08 AM By: Angelina Pih Entered By: Angelina Pih on 07/02/2023 10:56:24 -------------------------------------------------------------------------------- Wound Assessment Details Patient Name:  Date of Service: Matthew Burly [name] 07/02/2023 10:45 A M Medical Record Number: [id number] Patient Account Number: [id number] Date of Birth/Sex: Treating RN: 1955-11-12 (68 y.o. [name] Primary Care Zafira Munos: [name] Other Clinician: Referring Cornelius Marullo: Treating [name]/Extender: Matthew Folks in Treatment: 50 Wound Status Wound Number: 6 Primary Lymphedema Etiology: Wound Location: Left, Posterior Lower Leg [name], [name] ([id number]) [phone].pdf Page 9 of 11 Wound Status: Open Wounding Event: Blister Comorbid Arrhythmia, Congestive Heart Failure, Hypertension, Type II Date Acquired: 05/27/2023 History: Diabetes Weeks Of Treatment: 5 Clustered Wound: No Photos Wound Measurements Length: (cm) 1 Width: (cm) 0.7 Depth: (cm) 0.1 Area: (cm) 0.55 Volume: (cm) 0.055 % Reduction in Area: 85.4% % Reduction in Volume: 85.4% Epithelialization: None Tunneling: No Undermining: No Wound Description Classification: Full Thickness Without Exposed Support Structures Exudate Amount: Medium Exudate Type: Serosanguineous Exudate Color: red, brown Foul Odor After Cleansing: No Slough/Fibrino Yes Wound Bed Granulation Amount: Medium (34-66%) Exposed Structure Granulation Quality: Red, Pink Fat Layer (Subcutaneous Tissue) Exposed: Yes Necrotic Amount: Medium (34-66%) Necrotic Quality: Adherent Slough Treatment Notes Wound #6 (Lower Leg) Wound Laterality: Left, Posterior Cleanser Soap and Water Discharge Instruction: Gently cleanse wound with antibacterial soap, rinse and pat dry prior to dressing wounds Peri-Wound Care Topical Primary Dressing Silvercel Small 2x2 (in/in) Discharge Instruction: Apply Silvercel Small 2x2 (in/in) as instructed Secondary Dressing ABD Pad 5x9 (in/in) Discharge Instruction: Cover with ABD pad Secured With Compression Wrap Compression Stockings Add-Ons Electronic Signature(s) Signed:  07/02/2023 11:59:08 AM By: Angelina Pih Entered By: Angelina Pih on 07/02/2023 10:58:52 [name] ([id number]) [id number] Page 10 of 11 -------------------------------------------------------------------------------- Wound Assessment Details Patient Name: Date of Service: [name] 07/02/2023 10:45 A M Medical Record Number: [id number] Patient Account Number: [id number] Date of Birth/Sex: Treating RN: [birth year] (68 y.o. [name] Primary Care Reina Wilton: [name] Other Clinician: Referring Tyshon Fanning: Treating Lucillia Corson/Extender: Matthew Folks in Treatment: 50 Wound Status Wound Number: 7 Primary Venous Leg Ulcer Etiology: Wound Location: Right, Distal, Lateral Lower Leg Wound Status: Open Wounding Event: Blister Comorbid Arrhythmia, Congestive Heart Failure, Hypertension, Type II Date Acquired: 05/27/2023 History: Diabetes Weeks Of Treatment: 5 Clustered Wound: No Photos Wound Measurements Length: (cm) 1.2 Width: (cm) 0.9 Depth: (cm) 0.1 Area: (cm) 0.848  Volume: (cm) 0.085 % Reduction in Area: -115.8% % Reduction in Volume: -117.9% Epithelialization: None Tunneling: No Undermining: No Wound Description Classification: Full Thickness Without Exposed Support Structures Exudate Amount: Medium Exudate Type: Serosanguineous Exudate Color: red, brown Foul Odor After Cleansing: No Slough/Fibrino Yes Wound Bed Granulation Amount: Medium (34-66%) Exposed Structure Granulation Quality: Red, Pink Fat Layer (Subcutaneous Tissue) Exposed: Yes Necrotic Amount: Medium (34-66%) Necrotic Quality: Adherent Slough Treatment Notes Wound #7 (Lower Leg) Wound Laterality: Right, Lateral, Distal Cleanser Soap and Water Discharge Instruction: Gently cleanse wound with antibacterial soap, rinse and pat dry prior to dressing wounds Peri-Wound Care Topical CYSON, [name] ([id number])  [id number] Page 11 of 11 Primary Dressing Silvercel Small 2x2 (in/in) Discharge Instruction: Apply Silvercel Small 2x2 (in/in) as instructed Secondary Dressing ABD Pad 5x9 (in/in) Discharge Instruction: Cover with ABD pad Secured With Compression Wrap Compression Stockings Add-Ons Electronic Signature(s) Signed: 07/02/2023 11:59:08 AM By: Angelina Pih Entered By: Angelina Pih on 07/02/2023 10:59:30 -------------------------------------------------------------------------------- Vitals Details Patient Name: Date of Service: [name], [name] 07/02/2023 10:45 A M Medical Record Number: [id number] Patient Account Number: [id number] Date of Birth/Sex: Treating RN: 1955-09-01 (68 y.o. [name] Primary Care Saifullah Jolley: [name] Other Clinician: Referring Mitsugi Schrader: Treating Aleria Maheu/Extender: Matthew Folks in Treatment: 50 Vital Signs Time Taken: 10:42 Temperature (F): 97.9 Height (in): 73 Pulse (bpm): 68 Weight (lbs): 358 Respiratory Rate (breaths/min): 20 Body Mass Index (BMI): 47.2 Blood Pressure (mmHg): 125/67 Reference Range: 80 - 120 mg / dl Electronic Signature(s) Signed: 07/02/2023 11:59:08 AM By: Angelina Pih Entered By: Angelina Pih on 07/02/2023 10:44:27

## 2023-07-12 ENCOUNTER — Encounter: Payer: PPO | Admitting: Physician Assistant

## 2023-07-12 DIAGNOSIS — E11622 Type 2 diabetes mellitus with other skin ulcer: Secondary | ICD-10-CM | POA: Diagnosis not present

## 2023-07-13 ENCOUNTER — Ambulatory Visit: Payer: PPO | Admitting: Podiatry

## 2023-07-19 ENCOUNTER — Encounter: Payer: PPO | Attending: Physician Assistant | Admitting: Physician Assistant

## 2023-07-19 DIAGNOSIS — L97812 Non-pressure chronic ulcer of other part of right lower leg with fat layer exposed: Secondary | ICD-10-CM | POA: Diagnosis not present

## 2023-07-19 DIAGNOSIS — I890 Lymphedema, not elsewhere classified: Secondary | ICD-10-CM | POA: Insufficient documentation

## 2023-07-19 DIAGNOSIS — I110 Hypertensive heart disease with heart failure: Secondary | ICD-10-CM | POA: Insufficient documentation

## 2023-07-19 DIAGNOSIS — I5042 Chronic combined systolic (congestive) and diastolic (congestive) heart failure: Secondary | ICD-10-CM | POA: Diagnosis not present

## 2023-07-19 DIAGNOSIS — L97822 Non-pressure chronic ulcer of other part of left lower leg with fat layer exposed: Secondary | ICD-10-CM | POA: Insufficient documentation

## 2023-07-19 DIAGNOSIS — E11622 Type 2 diabetes mellitus with other skin ulcer: Secondary | ICD-10-CM | POA: Insufficient documentation

## 2023-07-26 ENCOUNTER — Ambulatory Visit: Payer: PPO | Admitting: Podiatry

## 2023-07-26 ENCOUNTER — Encounter: Payer: Self-pay | Admitting: Podiatry

## 2023-07-26 VITALS — Ht 73.0 in | Wt >= 6400 oz

## 2023-07-26 DIAGNOSIS — M79675 Pain in left toe(s): Secondary | ICD-10-CM

## 2023-07-26 DIAGNOSIS — B351 Tinea unguium: Secondary | ICD-10-CM

## 2023-07-26 DIAGNOSIS — M79674 Pain in right toe(s): Secondary | ICD-10-CM | POA: Diagnosis not present

## 2023-07-26 NOTE — Progress Notes (Signed)
       Subjective:  Patient ID: [name], male    DOB: 11/17/1955,  MRN: [id number]   [name] presents to clinic today for:  Chief Complaint  Patient presents with   DFc    He is here for a nail trim.    Patient notes nails are thick, discolored, elongated and painful in shoegear when trying to ambulate.    PCP is Merl Star, MD.  Past Medical History:  Diagnosis Date   Arthritis    Diabetes mellitus without complication (HCC)    Hypertension    Lymphedema 09/09/2020   Wound drainage    right leg 3/4 inch area open wound with clear drainage last 2 days, covering with gauze prn    Past Surgical History:  Procedure Laterality Date   COLONOSCOPY  2014 ?   FLEXIBLE SIGMOIDOSCOPY N/A 08/27/2014   Procedure: FLEXIBLE SIGMOIDOSCOPY;  Surgeon: Garrett Kallman, MD;  [facility];  Service: Endoscopy;  Laterality: N/A;   NO PAST SURGERIES     RIGHT/LEFT HEART CATH AND CORONARY ANGIOGRAPHY N/A 09/10/2020   Procedure: RIGHT/LEFT HEART CATH AND CORONARY ANGIOGRAPHY;  Surgeon: Knox Perl, MD;  [facility];  Service: Cardiovascular;  Laterality: N/A;    No Known Allergies  Review of Systems: Negative except as noted in the HPI.  Objective:  [name] is a pleasant 68 y.o. male in NAD. AAO x 3.  Vascular Examination: Capillary refill time is 3-5 seconds to toes bilateral. Trace palpable pedal pulses b/l LE. Digital hair sparse b/l.  Skin temperature gradient WNL b/l.  +1 pitting edema in forefoot.    Dermatological Examination: Pedal skin with normal turgor, texture and tone b/l. No open wounds. No interdigital macerations b/l. Toenails x10 are 3mm thick, discolored, dystrophic with subungual debris. There is pain with compression of the nail plates.  They are elongated x10  Assessment/Plan: 1. Pain due to onychomycosis of toenails of both feet     The mycotic toenails were sharply debrided x10 with sterile nail nippers and a power  debriding burr to decrease bulk/thickness and length.    Return in about 3 months (around 10/23/2023) for [facility]   Joe Murders, DPM, FACFAS [facility]     [address] [place], [place] [address]                Office [phone]  Fax [phone]

## 2023-07-27 ENCOUNTER — Ambulatory Visit: Payer: PPO | Admitting: Physician Assistant

## 2023-08-05 ENCOUNTER — Encounter: Payer: PPO | Admitting: Physician Assistant

## 2023-08-05 DIAGNOSIS — E11622 Type 2 diabetes mellitus with other skin ulcer: Secondary | ICD-10-CM | POA: Diagnosis not present

## 2023-08-05 DIAGNOSIS — I87311 Chronic venous hypertension (idiopathic) with ulcer of right lower extremity: Secondary | ICD-10-CM | POA: Diagnosis not present

## 2023-08-19 ENCOUNTER — Encounter: Payer: PPO | Attending: Physician Assistant | Admitting: Physician Assistant

## 2023-08-19 DIAGNOSIS — L97812 Non-pressure chronic ulcer of other part of right lower leg with fat layer exposed: Secondary | ICD-10-CM | POA: Insufficient documentation

## 2023-08-19 DIAGNOSIS — I499 Cardiac arrhythmia, unspecified: Secondary | ICD-10-CM | POA: Insufficient documentation

## 2023-08-19 DIAGNOSIS — L97822 Non-pressure chronic ulcer of other part of left lower leg with fat layer exposed: Secondary | ICD-10-CM | POA: Diagnosis not present

## 2023-08-19 DIAGNOSIS — I110 Hypertensive heart disease with heart failure: Secondary | ICD-10-CM | POA: Diagnosis not present

## 2023-08-19 DIAGNOSIS — I890 Lymphedema, not elsewhere classified: Secondary | ICD-10-CM | POA: Insufficient documentation

## 2023-08-19 DIAGNOSIS — E11622 Type 2 diabetes mellitus with other skin ulcer: Secondary | ICD-10-CM | POA: Diagnosis not present

## 2023-08-19 DIAGNOSIS — I5042 Chronic combined systolic (congestive) and diastolic (congestive) heart failure: Secondary | ICD-10-CM | POA: Diagnosis not present

## 2023-08-19 DIAGNOSIS — I872 Venous insufficiency (chronic) (peripheral): Secondary | ICD-10-CM | POA: Diagnosis not present

## 2023-08-24 ENCOUNTER — Ambulatory Visit: Admitting: Physician Assistant

## 2023-08-25 ENCOUNTER — Encounter: Admitting: Physician Assistant

## 2023-08-25 DIAGNOSIS — L97812 Non-pressure chronic ulcer of other part of right lower leg with fat layer exposed: Secondary | ICD-10-CM | POA: Diagnosis not present

## 2023-08-25 DIAGNOSIS — I890 Lymphedema, not elsewhere classified: Secondary | ICD-10-CM | POA: Diagnosis not present

## 2023-08-25 DIAGNOSIS — I872 Venous insufficiency (chronic) (peripheral): Secondary | ICD-10-CM | POA: Diagnosis not present

## 2023-08-25 DIAGNOSIS — E11622 Type 2 diabetes mellitus with other skin ulcer: Secondary | ICD-10-CM | POA: Diagnosis not present

## 2023-09-02 ENCOUNTER — Encounter: Admitting: Physician Assistant

## 2023-09-02 DIAGNOSIS — L97812 Non-pressure chronic ulcer of other part of right lower leg with fat layer exposed: Secondary | ICD-10-CM | POA: Diagnosis not present

## 2023-09-02 DIAGNOSIS — E11622 Type 2 diabetes mellitus with other skin ulcer: Secondary | ICD-10-CM | POA: Diagnosis not present

## 2023-09-02 DIAGNOSIS — I872 Venous insufficiency (chronic) (peripheral): Secondary | ICD-10-CM | POA: Diagnosis not present

## 2023-09-02 DIAGNOSIS — I890 Lymphedema, not elsewhere classified: Secondary | ICD-10-CM | POA: Diagnosis not present

## 2023-09-07 NOTE — Progress Notes (Deleted)
 Cardiology Office Note    Patient Name: [name] Date of Encounter: 09/07/2023  Primary Care Provider:  [name], MD Primary Cardiologist:  None Primary Electrophysiologist: None   Past Medical History    Past Medical History:  Diagnosis Date   Arthritis    Diabetes mellitus without complication (HCC)    Hypertension    Lymphedema 09/09/2020   Wound drainage    right leg 3/4 inch area open wound with clear drainage last 2 days, covering with gauze prn    History of Present Illness  AADAN CHENIER is a 68 y.o. male with a PMH of HFrEF, NICM, HTN, HLD, DM type II, morbid obesity, PVCs, history of tobacco abuse who presents today for 1 year follow-up.  Mr. Longsworth was seen initially in 08/2020 when he was in with complaint of weakness.  He was also found to have presyncope with negative orthostatic vitals.  Completed a 2D echo that showed moderately to severely decreased LV function with EF of 30% and global hypokinesis with grade 1 DD and mild dilation of the aortic root.  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  He was discharged on GDMT and seen in follow-up denied any shortness of breath.  He was continued on ramipril, carvedilol and Lasix.  He was seen by Dr. Tenny Craw on 01/28/2021 to establish care.  During visit he reported rare dizziness with no palpitations.  He was started on Entresto and had complaint of palpitations and wore event monitor that showed 10.7% PVCs.  He was placed on mexiletine 150 mg twice daily.  2D echo was repeated on 02/06/2021 showing EF of 45-50% with mildly decreased LV function mildly dilated RA with no significant valve abnormality.  He wore an event monitor that shows sinus rhythm with occasional PVCs and PACs but no atrial fibrillation or significant arrhythmias.  He was advised to continue current  medications.  He had Entresto titrated up and was last seen by Dr. Tenny Craw on 02/23/2023.  He was being seen at wound care for lymphedema and had no medication adjustments made at that time.   Patient denies chest pain, palpitations, dyspnea, PND, orthopnea, nausea, vomiting, dizziness, syncope, edema, weight gain, or early satiety.   Discussed the use of AI scribe software for clinical note transcription with the patient, who gave verbal consent to proceed.  History of Present Illness    ***Notes: -Last ischemic evaluation:  Review of Systems  Please see the history of present illness.    All other systems reviewed and are otherwise negative except as noted above.  Physical Exam    Wt Readings from Last 3 Encounters:  07/26/23 (!) 420 lb (190.5 kg)  02/23/23 (!) 420 lb 6.4 oz (190.7 kg)  07/13/22 (!) 358 lb (162.4 kg)   ZO:XWRUE were no vitals filed for this visit.,There is no height or weight on file to calculate BMI. GEN: Well nourished, well developed in no acute distress Neck: No JVD; No carotid bruits Pulmonary: Clear to auscultation without rales, wheezing or rhonchi  Cardiovascular: Normal rate. Regular rhythm. Normal S1. Normal S2.   Murmurs: There is no murmur.  ABDOMEN: Soft, non-tender, non-distended EXTREMITIES:  No edema; No deformity   EKG/LABS/ Recent Cardiac Studies   ECG personally reviewed by me today - ***  Risk Assessment/Calculations:   {Does this patient have ATRIAL FIBRILLATION?:[id number]}  Lab Results  Component Value Date   WBC 6.2 01/28/2021   HGB 15.8 01/28/2021   HCT 46.9 01/28/2021   MCV 84 01/28/2021   PLT 182 01/28/2021   Lab Results  Component Value Date   CREATININE 1.00 01/28/2021   BUN 19 01/28/2021   NA 137 01/28/2021   K 4.9 01/28/2021   CL 101 01/28/2021   CO2 20 01/28/2021   Lab Results  Component Value Date   CHOL 158 04/15/2021   HDL 53 04/15/2021   LDLCALC 88 04/15/2021   TRIG 89 04/15/2021   CHOLHDL 3.0  04/15/2021    Lab Results  Component Value Date   HGBA1C 6.6 (H) 09/09/2020   Assessment & Plan    1.  HFrEF/ICM:  2.  Essential hypertension:  3.  History of PVCs:  4.  DM type II:  5.  Hyperlipidemia:      Disposition: Follow-up with None or APP in *** months {Are you ordering a CV Procedure (e.g. stress test, cath, DCCV, TEE, etc)?   Press F2        :[id number]}   Signed, Napoleon Form, Leodis Rains, NP 09/07/2023, 6:30 PM [facility]

## 2023-09-08 ENCOUNTER — Ambulatory Visit: Payer: PPO | Admitting: Nurse Practitioner

## 2023-09-08 DIAGNOSIS — I5020 Unspecified systolic (congestive) heart failure: Secondary | ICD-10-CM

## 2023-09-08 DIAGNOSIS — E785 Hyperlipidemia, unspecified: Secondary | ICD-10-CM

## 2023-09-08 DIAGNOSIS — I493 Ventricular premature depolarization: Secondary | ICD-10-CM

## 2023-09-08 DIAGNOSIS — I10 Essential (primary) hypertension: Secondary | ICD-10-CM

## 2023-09-08 DIAGNOSIS — E1122 Type 2 diabetes mellitus with diabetic chronic kidney disease: Secondary | ICD-10-CM

## 2023-09-12 NOTE — Progress Notes (Unsigned)
 Cardiology Office Note   Date:  09/13/2023   ID:  Matthew, [name] Jan 12, 1956, MRN [id number]  PCP:  [name], MD  Cardiologist:   Dietrich Pates, MD   Patient presents for f/u of CHF      History of Present Illness: [name] is a 68 y.o. male with a history of HTN, HL, HFrEF   DM, lymphedema, morbid obesity Followed by Dr [name]  March 2022  Pt admitted  for dizziness, weakness  Echo showed LVEF was severely reduced.at 30%  He went on to have a L and R heart cath   T which showed no significant CAD and mild elevated LVEDP.  He was placed on Entresto.   In addtion a monitor showed 10.7% PVCs   Beause of palpitations he was placed on mexiletine 150 bid      The pt was initially seen at [facility]    Sept 2022, echo showed LVEF 45 to 50%   RVEF moderately reduced    Monitor showed 4% PVCs    I saw the pt in Sep 2024.  Since seen he said breathing can be a little bit challenging it is overall stable low.  He has lymphedema, some good and bad days.  He still is followed in the wound care clinic for his right leg going once a week.  He denies chest pain, no palpitations.   Current Meds  Medication Sig   aspirin EC 81 MG tablet Take 81 mg by mouth daily.   atorvastatin (LIPITOR) 40 MG tablet Take 1 tablet (40 mg total) by mouth daily. Take at bedtime by mouth once daily.   carvedilol (COREG) 12.5 MG tablet Take 1 tablet (12.5 mg total) by mouth 2 (two) times daily.   furosemide (LASIX) 40 MG tablet Take 40 mg by mouth 2 (two) times a week.   insulin glargine (LANTUS) 100 UNIT/ML injection Inject 30 Units into the skin daily.   Insulin Pen Needle (PEN NEEDLES) 31G X 8 MM MISC as directed once a day for insulin injection for 90 days   metFORMIN (GLUCOPHAGE) 500 MG tablet Take 1,000 mg by mouth 2 (two) times daily with a meal.   Multiple Vitamin (MULTIVITAMIN WITH MINERALS) TABS tablet Take 1 tablet by mouth every evening.    sacubitril-valsartan (ENTRESTO) 49-51 MG Take 1  tablet by mouth 2 (two) times daily.   spironolactone (ALDACTONE) 25 MG tablet Take 0.5 tablets (12.5 mg total) by mouth daily.     Allergies:   Patient has no known allergies.   Past Medical History:  Diagnosis Date   Arthritis    Diabetes mellitus without complication (HCC)    Hypertension    Lymphedema 09/09/2020   Wound drainage    right leg 3/4 inch area open wound with clear drainage last 2 days, covering with gauze prn    Past Surgical History:  Procedure Laterality Date   COLONOSCOPY  2014 ?   FLEXIBLE SIGMOIDOSCOPY N/A 08/27/2014   Procedure: FLEXIBLE SIGMOIDOSCOPY;  Surgeon: [name], MD;  [facility];  Service: Endoscopy;  Laterality: N/A;   NO PAST SURGERIES     RIGHT/LEFT HEART CATH AND CORONARY ANGIOGRAPHY N/A 09/10/2020   Procedure: RIGHT/LEFT HEART CATH AND CORONARY ANGIOGRAPHY;  Surgeon: [name], MD;  [facility];  Service: Cardiovascular;  Laterality: N/A;     Social History:  The patient  reports that he quit smoking about 3 years ago. His smoking use included cigarettes.  He started smoking about 45 years ago. He has a 63 pack-year smoking history. He has never used smokeless tobacco. He reports that he does not drink alcohol and does not use drugs.   Family History:  The patient's family history includes Heart attack in his father; Heart disease in his mother.    ROS:  Please see the history of present illness. All other systems are reviewed and  Negative to the above problem except as noted.    PHYSICAL EXAM: VS:  BP 108/70   Pulse 80   Ht 6\' 1"  (1.854 m)   Wt (!) 191.1 kg   SpO2 93%   BMI 55.60 kg/m   GEN: Morbidly obese 68 yo in no acute distress   Examined in chair HEENT: normal  Neck: JVP is difficult to assess Cardiac: RRR; no murmurs,    LE  Marked lymphedema, right greater than left Respiratory:  clear to auscultation GI: soft, nontender. MS: no deformity Moving all extremities     EKG:  EKG shows  sinus rhythm.  Occasional PACs.  Right bundle branch block.  Left anterior fascicular block.   Monitor  Aug 2024  Predominant rhythm is sinus rhythm   Rates 58 to 113 bpm   Average HR 79 bpm Occasional PVC (3.3% total), occasional PAC (2.7% total) 26 bursts of SVT, fastest for 5 beats (max rate 190 bpm), longest for 53 seconds at average rate 136 bpm   No triggered events or diary entries   ________________________________________________________________________________________________________   Patient had a min HR of 58 bpm, max HR of 190 bpm, and avg HR of 79 bpm. Predominant underlying rhythm was Sinus Rhythm. Bundle Branch Block/IVCD was present. 26 Supraventricular Tachycardia runs occurred, the run with the fastest interval lasting 5  beats with a max rate of 190 bpm, the longest lasting 52.8 secs with an avg rate of 136 bpm. Isolated SVEs were occasional (2.7%, 9156), SVE Couplets were rare (<1.0%, 181), and SVE Triplets were rare (<1.0%, 89). Isolated VEs were occasional (3.3%,  11122), VE Couplets were rare (<1.0%, 244), and no VE Triplets were present. Ventricular Bigeminy and Trigeminy were present.   Monitor  02/14/21 SR  Average HR 78 bpm   Occsaional PVCs  4% total    Echo   02/06/21  Left ventricular ejection fraction, by estimation, is 45 to 50%. The left ventricle has mildly decreased function. The left ventricle demonstrates global hypokinesis. The left ventricular internal cavity size was mildly dilated. Left ventricular diastolic parameters were normal. 1. Right ventricular systolic function is moderately reduced. The right ventricular size is normal. 2. 3. Right atrial size was mildly dilated. The mitral valve is normal in structure. No evidence of mitral valve regurgitation. No evidence of mitral stenosis. 4. 5. The aortic valve is normal in structure. Aortic valve regurgitation is not visualized.  R and L heart cath   09/10/20  RA 12/11, mean 8 mmHg.  RA  saturation 77%.  RV 37/5, EDP 10 mmHg.  PA 35/22, mean 28 mmHg.  PA saturation 81%.  PW 18/18, mean 16 mmHg.  Aortic saturation 98%. QT/QS 1.24 suggest insignificant left-to-right shunting.  May be at the right atrial level. Cardiac output 8.69, cardiac index 2.93 by Fick. LV 145/13, EDP 21 mmHg.  Aorta 112/25 with a mean of 91 mmHg. No pressure gradient across the aortic valve.   Angiographic data: LV: Dilated.  Global hypokinesis.  EF 30 to 35%.  EDP mildly elevated. Right dominant circulation.  No significant  coronary artery disease with D1 being equivalent to ramus intermediate, large vessel.  All vessels are mild luminal irregularity.   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  He also has left to right shunt however may be clinically insignificant.  Shunt potentially could be at the level of right atrium.   Lipid Panel    Component Value Date/Time   CHOL 158 04/15/2021 0917   TRIG 89 04/15/2021 0917   HDL 53 04/15/2021 0917   CHOLHDL 3.0 04/15/2021 0917   LDLCALC 88 04/15/2021 0917      Wt Readings from Last 3 Encounters:  09/13/23 (!) 191.1 kg  07/26/23 (!) 190.5 kg  02/23/23 (!) 190.7 kg      ASSESSMENT AND PLAN:  1  HFmrEF   LVEF 45 to 50%   volume status overall appears stable, difficult to tell given body habitus.  2  HTN  BP remains very well-controlled.  He denies dizziness.  3  Lipds    LDL 76, HDL 49, triglycerides 97.  4  PVCs.  Low burden on last monitor      5  Lymphedema.  Continues with wound care  6 DM   Follows with Dr [name]  Last A1C 6.9  Plan for follow-up next fall.  Current medicines are reviewed at length with the patient today.  The patient does not have concerns regarding medicines.  Signed, Dietrich Pates, MD  09/13/2023 9:59 PM    [facility] [address] [place], [place], [place]  [address] Phone: [phone]; Fax: [phone]

## 2023-09-13 ENCOUNTER — Encounter: Payer: Self-pay | Admitting: Internal Medicine

## 2023-09-13 ENCOUNTER — Ambulatory Visit: Attending: Internal Medicine | Admitting: Internal Medicine

## 2023-09-13 VITALS — BP 108/70 | HR 80 | Ht 73.0 in | Wt >= 6400 oz

## 2023-09-13 DIAGNOSIS — I10 Essential (primary) hypertension: Secondary | ICD-10-CM

## 2023-09-13 DIAGNOSIS — I493 Ventricular premature depolarization: Secondary | ICD-10-CM

## 2023-09-13 NOTE — Patient Instructions (Signed)
 Medication Instructions:   Your physician recommends that you continue on your current medications as directed. Please refer to the Current Medication list given to you today.  *If you need a refill on your cardiac medications before your next appointment, please call your pharmacy*    Follow-Up:  WITH DR. ROSS IN OCTOBER/NOVEMBER 2025       1st Floor: - Lobby - Registration  - Pharmacy  - Lab - Cafe  2nd Floor: - PV Lab - Diagnostic Testing (echo, CT, nuclear med)  3rd Floor: - Vacant  4th Floor: - TCTS (cardiothoracic surgery) - AFib Clinic - Structural Heart Clinic - Vascular Surgery  - Vascular Ultrasound  5th Floor: - HeartCare Cardiology (general and EP) - Clinical Pharmacy for coumadin, hypertension, lipid, weight-loss medications, and med management appointments    Valet parking services will be available as well.

## 2023-09-14 ENCOUNTER — Other Ambulatory Visit: Payer: Self-pay

## 2023-09-14 MED ORDER — CARVEDILOL 12.5 MG PO TABS
12.5000 mg | ORAL_TABLET | Freq: Two times a day (BID) | ORAL | 3 refills | Status: AC
  Filled 2023-11-24 – 2024-02-22 (×2): qty 180, 90d supply, fill #0
  Filled 2024-05-19: qty 180, 90d supply, fill #1

## 2023-09-16 ENCOUNTER — Encounter: Attending: Physician Assistant | Admitting: Physician Assistant

## 2023-09-16 DIAGNOSIS — I5042 Chronic combined systolic (congestive) and diastolic (congestive) heart failure: Secondary | ICD-10-CM | POA: Diagnosis not present

## 2023-09-16 DIAGNOSIS — I890 Lymphedema, not elsewhere classified: Secondary | ICD-10-CM | POA: Diagnosis not present

## 2023-09-16 DIAGNOSIS — E11622 Type 2 diabetes mellitus with other skin ulcer: Secondary | ICD-10-CM | POA: Insufficient documentation

## 2023-09-16 DIAGNOSIS — L97822 Non-pressure chronic ulcer of other part of left lower leg with fat layer exposed: Secondary | ICD-10-CM | POA: Diagnosis not present

## 2023-09-16 DIAGNOSIS — I499 Cardiac arrhythmia, unspecified: Secondary | ICD-10-CM | POA: Insufficient documentation

## 2023-09-16 DIAGNOSIS — I872 Venous insufficiency (chronic) (peripheral): Secondary | ICD-10-CM | POA: Diagnosis not present

## 2023-09-16 DIAGNOSIS — L97812 Non-pressure chronic ulcer of other part of right lower leg with fat layer exposed: Secondary | ICD-10-CM | POA: Diagnosis not present

## 2023-09-16 DIAGNOSIS — E11621 Type 2 diabetes mellitus with foot ulcer: Secondary | ICD-10-CM | POA: Diagnosis present

## 2023-09-16 DIAGNOSIS — I110 Hypertensive heart disease with heart failure: Secondary | ICD-10-CM | POA: Diagnosis not present

## 2023-09-24 ENCOUNTER — Encounter: Admitting: Physician Assistant

## 2023-09-24 DIAGNOSIS — E11622 Type 2 diabetes mellitus with other skin ulcer: Secondary | ICD-10-CM | POA: Diagnosis not present

## 2023-09-24 DIAGNOSIS — L97822 Non-pressure chronic ulcer of other part of left lower leg with fat layer exposed: Secondary | ICD-10-CM | POA: Diagnosis not present

## 2023-09-24 DIAGNOSIS — I890 Lymphedema, not elsewhere classified: Secondary | ICD-10-CM | POA: Diagnosis not present

## 2023-09-24 DIAGNOSIS — L97812 Non-pressure chronic ulcer of other part of right lower leg with fat layer exposed: Secondary | ICD-10-CM | POA: Diagnosis not present

## 2023-10-07 ENCOUNTER — Encounter: Admitting: Physician Assistant

## 2023-10-07 DIAGNOSIS — L97812 Non-pressure chronic ulcer of other part of right lower leg with fat layer exposed: Secondary | ICD-10-CM | POA: Diagnosis not present

## 2023-10-07 DIAGNOSIS — I890 Lymphedema, not elsewhere classified: Secondary | ICD-10-CM | POA: Diagnosis not present

## 2023-10-07 DIAGNOSIS — E11622 Type 2 diabetes mellitus with other skin ulcer: Secondary | ICD-10-CM | POA: Diagnosis not present

## 2023-10-07 DIAGNOSIS — I872 Venous insufficiency (chronic) (peripheral): Secondary | ICD-10-CM | POA: Diagnosis not present

## 2023-10-14 ENCOUNTER — Encounter: Attending: Physician Assistant | Admitting: Physician Assistant

## 2023-10-14 DIAGNOSIS — I890 Lymphedema, not elsewhere classified: Secondary | ICD-10-CM | POA: Diagnosis not present

## 2023-10-14 DIAGNOSIS — L97822 Non-pressure chronic ulcer of other part of left lower leg with fat layer exposed: Secondary | ICD-10-CM | POA: Insufficient documentation

## 2023-10-14 DIAGNOSIS — I872 Venous insufficiency (chronic) (peripheral): Secondary | ICD-10-CM | POA: Diagnosis not present

## 2023-10-14 DIAGNOSIS — I110 Hypertensive heart disease with heart failure: Secondary | ICD-10-CM | POA: Diagnosis not present

## 2023-10-14 DIAGNOSIS — L97812 Non-pressure chronic ulcer of other part of right lower leg with fat layer exposed: Secondary | ICD-10-CM | POA: Insufficient documentation

## 2023-10-14 DIAGNOSIS — I499 Cardiac arrhythmia, unspecified: Secondary | ICD-10-CM | POA: Insufficient documentation

## 2023-10-14 DIAGNOSIS — I5032 Chronic diastolic (congestive) heart failure: Secondary | ICD-10-CM | POA: Diagnosis not present

## 2023-10-14 DIAGNOSIS — E11622 Type 2 diabetes mellitus with other skin ulcer: Secondary | ICD-10-CM | POA: Diagnosis not present

## 2023-10-22 ENCOUNTER — Encounter: Admitting: Physician Assistant

## 2023-10-22 DIAGNOSIS — E11622 Type 2 diabetes mellitus with other skin ulcer: Secondary | ICD-10-CM | POA: Diagnosis not present

## 2023-10-22 DIAGNOSIS — I872 Venous insufficiency (chronic) (peripheral): Secondary | ICD-10-CM | POA: Diagnosis not present

## 2023-10-22 DIAGNOSIS — L97812 Non-pressure chronic ulcer of other part of right lower leg with fat layer exposed: Secondary | ICD-10-CM | POA: Diagnosis not present

## 2023-10-22 DIAGNOSIS — I890 Lymphedema, not elsewhere classified: Secondary | ICD-10-CM | POA: Diagnosis not present

## 2023-10-25 DIAGNOSIS — I872 Venous insufficiency (chronic) (peripheral): Secondary | ICD-10-CM | POA: Diagnosis not present

## 2023-10-25 DIAGNOSIS — Z125 Encounter for screening for malignant neoplasm of prostate: Secondary | ICD-10-CM | POA: Diagnosis not present

## 2023-10-25 DIAGNOSIS — I10 Essential (primary) hypertension: Secondary | ICD-10-CM | POA: Diagnosis not present

## 2023-10-25 DIAGNOSIS — Z1211 Encounter for screening for malignant neoplasm of colon: Secondary | ICD-10-CM | POA: Diagnosis not present

## 2023-10-25 DIAGNOSIS — Z794 Long term (current) use of insulin: Secondary | ICD-10-CM | POA: Diagnosis not present

## 2023-10-25 DIAGNOSIS — L97929 Non-pressure chronic ulcer of unspecified part of left lower leg with unspecified severity: Secondary | ICD-10-CM | POA: Diagnosis not present

## 2023-10-25 DIAGNOSIS — E7800 Pure hypercholesterolemia, unspecified: Secondary | ICD-10-CM | POA: Diagnosis not present

## 2023-10-25 DIAGNOSIS — Z23 Encounter for immunization: Secondary | ICD-10-CM | POA: Diagnosis not present

## 2023-10-25 DIAGNOSIS — E11622 Type 2 diabetes mellitus with other skin ulcer: Secondary | ICD-10-CM | POA: Diagnosis not present

## 2023-10-25 DIAGNOSIS — I509 Heart failure, unspecified: Secondary | ICD-10-CM | POA: Diagnosis not present

## 2023-10-25 DIAGNOSIS — E1169 Type 2 diabetes mellitus with other specified complication: Secondary | ICD-10-CM | POA: Diagnosis not present

## 2023-10-25 DIAGNOSIS — Z0000 Encounter for general adult medical examination without abnormal findings: Secondary | ICD-10-CM | POA: Diagnosis not present

## 2023-10-25 DIAGNOSIS — Z1331 Encounter for screening for depression: Secondary | ICD-10-CM | POA: Diagnosis not present

## 2023-10-25 DIAGNOSIS — I428 Other cardiomyopathies: Secondary | ICD-10-CM | POA: Diagnosis not present

## 2023-10-26 ENCOUNTER — Ambulatory Visit: Payer: PPO | Admitting: Podiatry

## 2023-10-26 DIAGNOSIS — M79674 Pain in right toe(s): Secondary | ICD-10-CM

## 2023-10-26 DIAGNOSIS — B351 Tinea unguium: Secondary | ICD-10-CM | POA: Diagnosis not present

## 2023-10-26 DIAGNOSIS — M79675 Pain in left toe(s): Secondary | ICD-10-CM | POA: Diagnosis not present

## 2023-10-26 NOTE — Progress Notes (Unsigned)
    Subjective:  Patient ID: [name], male    DOB: [birth year],  MRN: [id number]  [name] presents to clinic today for: No chief complaint on file.  Patient notes nails are thick, discolored, elongated and painful in shoegear when trying to ambulate.  Patient is still going to the wound care center at [facility] for his bilateral leg wounds and lymphedema.  Notes that the fissure on the right heel has been improving since going there.  Compressive wraps on today.  PCP is Merl Star, MD.  Past Medical History:  Diagnosis Date   Arthritis    Diabetes mellitus without complication (HCC)    Hypertension    Lymphedema 09/09/2020   Wound drainage    right leg 3/4 inch area open wound with clear drainage last 2 days, covering with gauze prn   Past Surgical History:  Procedure Laterality Date   COLONOSCOPY  2014 ?   FLEXIBLE SIGMOIDOSCOPY N/A 08/27/2014   Procedure: FLEXIBLE SIGMOIDOSCOPY;  Surgeon: Garrett Kallman, MD;  [facility];  Service: Endoscopy;  Laterality: N/A;   NO PAST SURGERIES     RIGHT/LEFT HEART CATH AND CORONARY ANGIOGRAPHY N/A 09/10/2020   Procedure: RIGHT/LEFT HEART CATH AND CORONARY ANGIOGRAPHY;  Surgeon: Knox Perl, MD;  [facility];  Service: Cardiovascular;  Laterality: N/A;   No Known Allergies  Review of Systems: Negative except as noted in the HPI.  Objective:  [name] is a pleasant 68 y.o. male in NAD. AAO x 3.  Vascular Examination: Capillary refill time is 3-5 seconds to toes bilateral. Palpable pedal pulses b/l LE. Digital hair sparse b/l.   Dermatological Examination: Pedal skin with normal turgor, texture and tone b/l. No open wounds. No interdigital macerations b/l. Toenails x10 are 3mm thick, discolored, dystrophic with subungual debris. There is pain with compression of the nail plates.  They are elongated x10   Assessment/Plan: 1. Pain due to onychomycosis of toenails of both feet     The  mycotic toenails were sharply debrided x10 with sterile nail nippers and a power debriding burr to decrease bulk/thickness and length.    Return in 3 months (on 01/26/2024) for [facility].   Joe Murders, DPM, FACFAS [facility]     [address] [place], [place] [address]                Office [phone]  Fax [phone]

## 2023-11-04 ENCOUNTER — Encounter: Admitting: Physician Assistant

## 2023-11-04 DIAGNOSIS — E11622 Type 2 diabetes mellitus with other skin ulcer: Secondary | ICD-10-CM | POA: Diagnosis not present

## 2023-11-04 DIAGNOSIS — I872 Venous insufficiency (chronic) (peripheral): Secondary | ICD-10-CM | POA: Diagnosis not present

## 2023-11-04 DIAGNOSIS — I890 Lymphedema, not elsewhere classified: Secondary | ICD-10-CM | POA: Diagnosis not present

## 2023-11-04 DIAGNOSIS — L97812 Non-pressure chronic ulcer of other part of right lower leg with fat layer exposed: Secondary | ICD-10-CM | POA: Diagnosis not present

## 2023-11-18 ENCOUNTER — Encounter: Attending: Physician Assistant | Admitting: Physician Assistant

## 2023-11-18 DIAGNOSIS — L97812 Non-pressure chronic ulcer of other part of right lower leg with fat layer exposed: Secondary | ICD-10-CM | POA: Diagnosis not present

## 2023-11-18 DIAGNOSIS — L97822 Non-pressure chronic ulcer of other part of left lower leg with fat layer exposed: Secondary | ICD-10-CM | POA: Insufficient documentation

## 2023-11-18 DIAGNOSIS — I890 Lymphedema, not elsewhere classified: Secondary | ICD-10-CM | POA: Insufficient documentation

## 2023-11-18 DIAGNOSIS — E11622 Type 2 diabetes mellitus with other skin ulcer: Secondary | ICD-10-CM | POA: Insufficient documentation

## 2023-11-23 ENCOUNTER — Other Ambulatory Visit (HOSPITAL_COMMUNITY): Payer: Self-pay

## 2023-11-24 ENCOUNTER — Other Ambulatory Visit (HOSPITAL_COMMUNITY): Payer: Self-pay

## 2023-11-24 MED ORDER — INSULIN PEN NEEDLE 31G X 8 MM MISC
3 refills | Status: AC
  Filled 2023-11-24: qty 100, 100d supply, fill #0

## 2023-11-24 MED ORDER — INSULIN GLARGINE 100 UNIT/ML SOLOSTAR PEN
25.0000 [IU] | PEN_INJECTOR | Freq: Every day | SUBCUTANEOUS | 3 refills | Status: AC
  Filled 2023-11-24: qty 6, 24d supply, fill #0

## 2023-11-24 MED ORDER — EASY TOUCH ALCOHOL PREP MEDIUM 70 % PADS
MEDICATED_PAD | 2 refills | Status: AC
  Filled 2023-11-24: qty 300, 100d supply, fill #0

## 2023-11-25 ENCOUNTER — Encounter: Payer: Self-pay | Admitting: Pharmacist

## 2023-11-25 ENCOUNTER — Other Ambulatory Visit (HOSPITAL_COMMUNITY): Payer: Self-pay

## 2023-11-25 ENCOUNTER — Other Ambulatory Visit: Payer: Self-pay

## 2023-11-25 MED ORDER — CARVEDILOL 12.5 MG PO TABS
12.5000 mg | ORAL_TABLET | Freq: Two times a day (BID) | ORAL | 3 refills | Status: AC
  Filled 2023-11-25: qty 180, 90d supply, fill #0
  Filled 2024-05-19 (×2): qty 180, 90d supply, fill #1

## 2023-11-25 MED ORDER — SPIRONOLACTONE 25 MG PO TABS
12.5000 mg | ORAL_TABLET | Freq: Every day | ORAL | 3 refills | Status: AC
  Filled 2023-11-25 – 2023-11-26 (×2): qty 45, 90d supply, fill #0
  Filled 2024-02-22: qty 45, 90d supply, fill #1
  Filled 2024-05-19 (×2): qty 45, 90d supply, fill #2

## 2023-11-25 MED ORDER — FUROSEMIDE 40 MG PO TABS
40.0000 mg | ORAL_TABLET | ORAL | 3 refills | Status: AC
  Filled 2023-11-25: qty 40, 94d supply, fill #0

## 2023-11-25 MED ORDER — METFORMIN HCL 1000 MG PO TABS
1000.0000 mg | ORAL_TABLET | Freq: Two times a day (BID) | ORAL | 3 refills | Status: AC
  Filled 2023-11-25: qty 180, 90d supply, fill #0
  Filled 2024-02-22: qty 180, 90d supply, fill #1
  Filled 2024-05-19 (×2): qty 180, 90d supply, fill #2

## 2023-11-25 MED ORDER — ATORVASTATIN CALCIUM 40 MG PO TABS
40.0000 mg | ORAL_TABLET | Freq: Every day | ORAL | 3 refills | Status: AC
  Filled 2023-11-25 – 2023-11-26 (×2): qty 90, 90d supply, fill #0
  Filled 2024-02-22: qty 90, 90d supply, fill #1
  Filled 2024-05-19 (×3): qty 90, 90d supply, fill #2

## 2023-11-26 ENCOUNTER — Other Ambulatory Visit (HOSPITAL_COMMUNITY): Payer: Self-pay

## 2023-11-26 ENCOUNTER — Other Ambulatory Visit: Payer: Self-pay

## 2023-11-26 MED ORDER — SACUBITRIL-VALSARTAN 49-51 MG PO TABS
1.0000 | ORAL_TABLET | Freq: Two times a day (BID) | ORAL | 3 refills | Status: AC
  Filled 2023-11-26 – 2024-02-22 (×2): qty 180, 90d supply, fill #0
  Filled 2024-05-19 (×2): qty 180, 90d supply, fill #1
  Filled 2024-07-13: qty 180, 90d supply, fill #2

## 2023-11-29 ENCOUNTER — Other Ambulatory Visit (HOSPITAL_COMMUNITY): Payer: Self-pay

## 2023-11-29 ENCOUNTER — Other Ambulatory Visit: Payer: Self-pay

## 2023-12-09 ENCOUNTER — Encounter: Admitting: Physician Assistant

## 2023-12-09 DIAGNOSIS — L97812 Non-pressure chronic ulcer of other part of right lower leg with fat layer exposed: Secondary | ICD-10-CM | POA: Diagnosis not present

## 2023-12-09 DIAGNOSIS — E11622 Type 2 diabetes mellitus with other skin ulcer: Secondary | ICD-10-CM | POA: Diagnosis not present

## 2023-12-09 DIAGNOSIS — I890 Lymphedema, not elsewhere classified: Secondary | ICD-10-CM | POA: Diagnosis not present

## 2023-12-30 ENCOUNTER — Encounter: Attending: Physician Assistant | Admitting: Physician Assistant

## 2023-12-30 DIAGNOSIS — I890 Lymphedema, not elsewhere classified: Secondary | ICD-10-CM | POA: Diagnosis not present

## 2023-12-30 DIAGNOSIS — L97812 Non-pressure chronic ulcer of other part of right lower leg with fat layer exposed: Secondary | ICD-10-CM | POA: Diagnosis not present

## 2023-12-30 DIAGNOSIS — I110 Hypertensive heart disease with heart failure: Secondary | ICD-10-CM | POA: Insufficient documentation

## 2023-12-30 DIAGNOSIS — I5042 Chronic combined systolic (congestive) and diastolic (congestive) heart failure: Secondary | ICD-10-CM | POA: Diagnosis not present

## 2023-12-30 DIAGNOSIS — I499 Cardiac arrhythmia, unspecified: Secondary | ICD-10-CM | POA: Diagnosis not present

## 2023-12-30 DIAGNOSIS — L97822 Non-pressure chronic ulcer of other part of left lower leg with fat layer exposed: Secondary | ICD-10-CM | POA: Diagnosis not present

## 2023-12-30 DIAGNOSIS — E11622 Type 2 diabetes mellitus with other skin ulcer: Secondary | ICD-10-CM | POA: Diagnosis not present

## 2024-01-13 ENCOUNTER — Encounter: Admitting: Physician Assistant

## 2024-01-13 DIAGNOSIS — I890 Lymphedema, not elsewhere classified: Secondary | ICD-10-CM | POA: Diagnosis not present

## 2024-01-13 DIAGNOSIS — L97822 Non-pressure chronic ulcer of other part of left lower leg with fat layer exposed: Secondary | ICD-10-CM | POA: Diagnosis not present

## 2024-01-13 DIAGNOSIS — E11622 Type 2 diabetes mellitus with other skin ulcer: Secondary | ICD-10-CM | POA: Diagnosis not present

## 2024-01-13 DIAGNOSIS — L97812 Non-pressure chronic ulcer of other part of right lower leg with fat layer exposed: Secondary | ICD-10-CM | POA: Diagnosis not present

## 2024-02-02 DIAGNOSIS — I890 Lymphedema, not elsewhere classified: Secondary | ICD-10-CM | POA: Diagnosis not present

## 2024-02-08 ENCOUNTER — Ambulatory Visit: Admitting: Podiatry

## 2024-02-22 ENCOUNTER — Other Ambulatory Visit: Payer: Self-pay

## 2024-02-22 ENCOUNTER — Other Ambulatory Visit (HOSPITAL_COMMUNITY): Payer: Self-pay

## 2024-02-23 ENCOUNTER — Other Ambulatory Visit (HOSPITAL_COMMUNITY): Payer: Self-pay

## 2024-02-28 ENCOUNTER — Ambulatory Visit (INDEPENDENT_AMBULATORY_CARE_PROVIDER_SITE_OTHER): Admitting: Podiatry

## 2024-02-28 DIAGNOSIS — M79674 Pain in right toe(s): Secondary | ICD-10-CM | POA: Diagnosis not present

## 2024-02-28 DIAGNOSIS — M79675 Pain in left toe(s): Secondary | ICD-10-CM

## 2024-02-28 DIAGNOSIS — B351 Tinea unguium: Secondary | ICD-10-CM

## 2024-02-28 NOTE — Progress Notes (Signed)
    Subjective:  Patient ID: [name], male    DOB: 1956-01-30,  MRN: [id number]  [name] presents to clinic today for:  Chief Complaint  Patient presents with   Diabetes    [facility] IDDM A1C 6.6 Toenail trim.   Patient notes nails are thick, discolored, elongated and painful in shoegear when trying to ambulate.  Patient was discharged from the wound care center in [place] approximately 2 months ago.  He continues with daily compressive wraps to his lower legs.  He does use his compression pumps at home.  PCP is Matthew Ingle, MD.  Past Medical History:  Diagnosis Date   Arthritis    Diabetes mellitus without complication (HCC)    Hypertension    Lymphedema 09/09/2020   Wound drainage    right leg 3/4 inch area open wound with clear drainage last 2 days, covering with gauze prn   Past Surgical History:  Procedure Laterality Date   COLONOSCOPY  2014 ?   FLEXIBLE SIGMOIDOSCOPY N/A 08/27/2014   Procedure: FLEXIBLE SIGMOIDOSCOPY;  Surgeon: Gladis MARLA Louder, MD;  [facility];  Service: Endoscopy;  Laterality: N/A;   NO PAST SURGERIES     RIGHT/LEFT HEART CATH AND CORONARY ANGIOGRAPHY N/A 09/10/2020   Procedure: RIGHT/LEFT HEART CATH AND CORONARY ANGIOGRAPHY;  Surgeon: Ladona Heinz, MD;  [facility];  Service: Cardiovascular;  Laterality: N/A;   No Known Allergies  Review of Systems: Negative except as noted in the HPI.  Objective:  [name] is a pleasant 68 y.o. male in NAD. AAO x 3.  Vascular Examination: Capillary refill time is 3-5 seconds to toes bilateral. Palpable pedal pulses b/l LE. Digital hair sparse b/l.  +1 pitting, brawny edema bilateral.  Dermatological Examination: Pedal skin with decreased turgor, texture and tone b/l. No open wounds. No interdigital macerations b/l. Toenails x10 are 3mm thick, discolored, dystrophic with subungual debris. There is pain with compression of the nail plates.  They are elongated  x10  Assessment/Plan: 1. Pain due to onychomycosis of toenails of both feet    The mycotic toenails were sharply debrided x10 with sterile nail nippers and a power debriding burr to decrease bulk/thickness and length.    Return in about 3 months (around 05/29/2024) for [facility].   Awanda CHARM Imperial, DPM, FACFAS [facility]     [address] [place], [place] [address]                Office [phone]  Fax [phone]

## 2024-05-04 DIAGNOSIS — I10 Essential (primary) hypertension: Secondary | ICD-10-CM | POA: Diagnosis not present

## 2024-05-04 DIAGNOSIS — I872 Venous insufficiency (chronic) (peripheral): Secondary | ICD-10-CM | POA: Diagnosis not present

## 2024-05-04 DIAGNOSIS — E11622 Type 2 diabetes mellitus with other skin ulcer: Secondary | ICD-10-CM | POA: Diagnosis not present

## 2024-05-04 DIAGNOSIS — Z1211 Encounter for screening for malignant neoplasm of colon: Secondary | ICD-10-CM | POA: Diagnosis not present

## 2024-05-04 DIAGNOSIS — E1169 Type 2 diabetes mellitus with other specified complication: Secondary | ICD-10-CM | POA: Diagnosis not present

## 2024-05-04 DIAGNOSIS — E7800 Pure hypercholesterolemia, unspecified: Secondary | ICD-10-CM | POA: Diagnosis not present

## 2024-05-04 DIAGNOSIS — L97929 Non-pressure chronic ulcer of unspecified part of left lower leg with unspecified severity: Secondary | ICD-10-CM | POA: Diagnosis not present

## 2024-05-04 DIAGNOSIS — I428 Other cardiomyopathies: Secondary | ICD-10-CM | POA: Diagnosis not present

## 2024-05-04 DIAGNOSIS — Z23 Encounter for immunization: Secondary | ICD-10-CM | POA: Diagnosis not present

## 2024-05-04 DIAGNOSIS — Z794 Long term (current) use of insulin: Secondary | ICD-10-CM | POA: Diagnosis not present

## 2024-05-04 DIAGNOSIS — I509 Heart failure, unspecified: Secondary | ICD-10-CM | POA: Diagnosis not present

## 2024-05-19 ENCOUNTER — Other Ambulatory Visit (HOSPITAL_COMMUNITY): Payer: Self-pay

## 2024-05-29 ENCOUNTER — Ambulatory Visit: Admitting: Podiatry

## 2024-06-20 ENCOUNTER — Ambulatory Visit

## 2024-06-20 DIAGNOSIS — E119 Type 2 diabetes mellitus without complications: Secondary | ICD-10-CM

## 2024-06-20 DIAGNOSIS — M79674 Pain in right toe(s): Secondary | ICD-10-CM

## 2024-06-20 DIAGNOSIS — M79675 Pain in left toe(s): Secondary | ICD-10-CM | POA: Diagnosis not present

## 2024-06-20 DIAGNOSIS — B351 Tinea unguium: Secondary | ICD-10-CM | POA: Diagnosis not present

## 2024-06-20 NOTE — Progress Notes (Signed)
"  °  °  Subjective:  Patient ID: [name], male    DOB: August 09, 1955,  MRN: [id number]  [name] presents to clinic today for:  Chief Complaint  Patient presents with   [facility]    Rm11 Diabetic foot care/ last A1C 5.7   Patient notes nails are thick, discolored, elongated and painful in shoegear when trying to ambulate.  Patient does not have any pedal wounds. He has compression wraps on his lower legs.  He continues with daily compressive wraps to his lower legs.  He does use his compression pumps at home.  PCP is Rexanne Ingle, MD.  Past Medical History:  Diagnosis Date   Arthritis    Diabetes mellitus without complication (HCC)    Hypertension    Lymphedema 09/09/2020   Wound drainage    right leg 3/4 inch area open wound with clear drainage last 2 days, covering with gauze prn   Past Surgical History:  Procedure Laterality Date   COLONOSCOPY  2014 ?   FLEXIBLE SIGMOIDOSCOPY N/A 08/27/2014   Procedure: FLEXIBLE SIGMOIDOSCOPY;  Surgeon: Gladis MARLA Louder, MD;  [facility];  Service: Endoscopy;  Laterality: N/A;   NO PAST SURGERIES     RIGHT/LEFT HEART CATH AND CORONARY ANGIOGRAPHY N/A 09/10/2020   Procedure: RIGHT/LEFT HEART CATH AND CORONARY ANGIOGRAPHY;  Surgeon: Ladona Heinz, MD;  [facility];  Service: Cardiovascular;  Laterality: N/A;   No Known Allergies  Review of Systems: Negative except as noted in the HPI.  Objective:  [name] is a pleasant 69 y.o. male in NAD. AAO x 3.  Vascular Examination: Capillary refill time is 3-5 seconds to toes bilateral. Palpable pedal pulses b/l LE. Digital hair sparse b/l.  +1 pitting, brawny edema bilateral.  Dermatological Examination: Pedal skin with decreased turgor, texture and tone b/l. No open wounds. No interdigital macerations b/l. Toenails x10 are 3mm thick, discolored, dystrophic with subungual debris. There is pain with compression of the nail plates.  They are elongated  x10  Assessment/Plan: 1. Pain due to onychomycosis of toenails of both feet   2. Diabetes mellitus without complication (HCC)     The mycotic toenails were sharply debrided x10 with sterile nail nippers and a power debriding burr to decrease bulk/thickness and length.    No follow-ups on file.   Prentice Ovens, DPM [facility]     [address] [place], [place] [address]                Office [phone]  Fax [phone] "

## 2024-07-12 NOTE — Progress Notes (Signed)
 " Cardiology Office Note:    Date:  07/15/2024   ID:  [name], [name] 1955/10/03, MRN [id number]  PCP:  Rexanne Ingle, MD   [facility] Providers Cardiologist:  Vina Gull, MD     Referring MD: Rexanne Ingle, MD   Chief complaint: Follow-up     History of Present Illness:   [name] is a 69 y.o. male with a hx of HFrEF, HTN, HLD, T2DM, chronic lymphedema of bilateral lower extremities, morbid obesity, history of tobacco abuse presents today for follow-up of chronic cardiovascular conditions.  Hospitalization for dizziness and weakness in 08/2020, found to have LVEF of 30% on echo, severely decreased LV function, global hypokinesis, moderately dilated LV, G1 DD, no significant valvular disease, mild dilatation of aortic root.  R/LHC at that time showing no significant coronary artery disease, NICM, LVEDP 21 mmHg, left-to-right shunt, likely hemodynamically insignificant at the level of the right atrium, mild pulmonary hypertension.  Cardiac event monitor 10/2020 with underlying sinus rhythm, 2 episodes of NSVT (4 beats/121 bpm).  59 SVT runs (longest: 17.5 seconds /fastest: 187 bpm), frequent PVCs (10.7%).  Mexiletine 150 mg BID started initially for high PVC burden.  Spironolactone , Entresto , carvedilol , Lasix  given for HFrEF GDMT, SGLT2i avoided due to history of PVD, scrotal cellulitis.  Repeat monitor on mexiletine showed occasional PVCs (4% burden) and 62 SVT runs.  Repeat echo in 01/2021 showing LVEF 45-50%, mildly decreased LV function, global hypokinesis, mild dilatation of the LV cavity, normal diastolic parameters, RV SF moderately reduced, RA mildly dilated, no valvular disease.  Mexiletine stopped due to cost in 11/2022, carvedilol  increased.  Repeat cardiac event monitor in 01/2023 showed occasional PVCs (3.3%) 26 runs of SVT (longest 53 seconds/fastest 190 bpm).  Most recent follow-up in the cardiology clinic with Dr. Gull in 08/2023, patient complained of  occasional difficulties with breathing, Lasix  changed to 40 mg 2 times weekly.  He presents to clinic with his wife, appears stable from a cardiovascular standpoint.  He reports continued episodic tachypalpitations lasting 3-5 minutes, occasionally causing chest pain that resolves with return to normal rhythm.  He does report chronic two-pillow orthopnea.  His wife admits to observing the patient frequently snoring with apneic episodes.  He reports rare, mild orthostatic dizziness 2/2 positional change that resolves in seconds, he does not believe this is clinically significant.  Denies dark/tarry/bloody stools, SOB, hematuria, weight changes.  He has been working with the wound clinic over the past 1.5 years to improve his symptoms of bilateral lower extremity lymphedema that he manages with daily leg wraps, lymphedema pumps, and twice weekly Lasix .  The most physical activity he gets in his day-to-day life is walking through his apartment versus running errands with his wife.  ROS:   Please see the history of present illness.    All other systems reviewed and are negative.     Past Medical History:  Diagnosis Date   Arthritis    Diabetes mellitus without complication (HCC)    Hypertension    Lymphedema 09/09/2020   Wound drainage    right leg 3/4 inch area open wound with clear drainage last 2 days, covering with gauze prn    Past Surgical History:  Procedure Laterality Date   COLONOSCOPY  2014 ?   FLEXIBLE SIGMOIDOSCOPY N/A 08/27/2014   Procedure: FLEXIBLE SIGMOIDOSCOPY;  Surgeon: Gladis MARLA Louder, MD;  Location: [facility];  Service: Endoscopy;  Laterality: N/A;   NO PAST SURGERIES     RIGHT/LEFT HEART CATH  AND CORONARY ANGIOGRAPHY N/A 09/10/2020   Procedure: RIGHT/LEFT HEART CATH AND CORONARY ANGIOGRAPHY;  Surgeon: Ladona Heinz, MD;  Location: [facility];  Service: Cardiovascular;  Laterality: N/A;    Current Medications: Active Medications[1]   Allergies:   Patient has no  known allergies.   Social History   Socioeconomic History   Marital status: Divorced    Spouse name: Not on file   Number of children: 2   Years of education: Not on file   Highest education level: Not on file  Occupational History   Occupation: disability  Tobacco Use   Smoking status: Former    Current packs/day: 0.00    Average packs/day: 1.5 packs/day for 42.0 years (63.0 ttl pk-yrs)    Types: Cigarettes    Start date: 05/03/1978    Quit date: 05/03/2020    Years since quitting: 4.2   Smokeless tobacco: Never   Tobacco comments:    congratulated and encouraged continued cessation.  Vaping Use   Vaping status: Never Used  Substance and Sexual Activity   Alcohol  use: No   Drug use: No   Sexual activity: Yes  Other Topics Concern   Not on file  Social History Narrative   Not on file   Social Drivers of Health   Tobacco Use: Medium Risk (07/13/2024)   Patient History    Smoking Tobacco Use: Former    Smokeless Tobacco Use: Never    Passive Exposure: Not on Actuary Strain: Not on file  Food Insecurity: Not on file  Transportation Needs: Not on file  Physical Activity: Not on file  Stress: Not on file  Social Connections: Not on file  Depression (EYV7-0): Not on file  Alcohol  Screen: Not on file  Housing: Not on file  Utilities: Not on file  Health Literacy: Not on file     Family History: The patient's family history includes Heart attack in his father; Heart disease in his mother.  EKGs/Labs/Other Studies Reviewed:    The following studies were reviewed today:  EKG Interpretation Date/Time:  Thursday July 13 2024 11:03:42 EST Ventricular Rate:  74 PR Interval:  208 QRS Duration:  166 QT Interval:  438 QTC Calculation: 486 R Axis:   -74  Text Interpretation: Sinus rhythm with sinus arrhythmia with occasional Premature ventricular complexes Left axis deviation Right bundle branch block Possible Lateral infarct , age undetermined  When compared with ECG of 13-Sep-2023 09:47, Borderline criteria for Lateral infarct are now Present Confirmed by Anye Brose [id number]) on 07/13/2024 11:17:54 AM    Recent Labs: 07/13/2024: BUN 24; Creatinine, Ser 0.78; Potassium 4.9; Sodium 140  Recent Lipid Panel    Component Value Date/Time   CHOL 158 04/15/2021 0917   TRIG 89 04/15/2021 0917   HDL 53 04/15/2021 0917   CHOLHDL 3.0 04/15/2021 0917   LDLCALC 88 04/15/2021 0917     Risk Assessment/Calculations:       STOP-Bang Score:  8       Physical Exam:    VS:  BP 130/80   Pulse 79   Ht 6' 1 (1.854 m)   Wt (!) 426 lb 9.6 oz (193.5 kg)   SpO2 95%   BMI 56.28 kg/m        Wt Readings from Last 3 Encounters:  07/13/24 (!) 426 lb 9.6 oz (193.5 kg)  09/13/23 (!) 421 lb 6.4 oz (191.1 kg)  07/26/23 (!) 420 lb (190.5 kg)     GEN: Morbidly obese, well developed  in no acute distress HEENT: Normal NECK:  No carotid bruits CARDIAC:  S1-S2 normal, RRR, no murmurs, rubs, gallops RESPIRATORY:  Clear to auscultation without rales, wheezing or rhonchi  MUSCULOSKELETAL: Bilateral nonpitting lower extremity edema; No deformity  SKIN: Warm and dry NEUROLOGIC:  Alert and oriented x 3 PSYCHIATRIC:  Normal affect       Assessment & Plan PVC's (premature ventricular contractions) SVT (supraventricular tachycardia) History of PVCs and SVT demonstrated on cardiac event monitors in the past He reports rare/occasional episodes of tachypalpitations lasting 3-5 minutes in duration Can lead to chest pain if they do not resolve after a few minutes, with chest pain resolution once palpitation resolved No chest pain outside of palpitations Reports these only occur around 1-2 times a month, feels as though they are fairly well-controlled Will perform 2-week cardiac event monitor to assess ectopy burden and adjust meds accordingly ED precautions given Chest pain of uncertain etiology LHC 2022: No significant coronary artery disease,  mild luminal irregularity in all vessels Rare chest pain only occurring in setting of prolonged ectopy, stops with tachypalpitation resolution, does not occur with activity, does not happen often Suspect this is due to demand ischemia with prolonged episodes of ectopy in the setting of morbid obesity (426 lb) Will start workup with ZIO monitor and echo, could consider stress test or coronary CTA at follow-up visit ED precautions given Continue aspirin  EC 81 mg daily, atorvastatin  40 mg daily HFrEF (heart failure with reduced ejection fraction) (HCC) Echo 02/06/2021: LVEF 45-50%, mildly decreased LV function, global hypokinesis, moderately reduced RV function Mild DOE on exertion, not at rest, occasional orthostatic dizziness reported Chronic two-pillow orthopnea, likely in the setting of OHS Does not check weight regularly at home, appears stable from our records Will update echo today Continue carvedilol  12.5 mg twice daily, Lasix  40 mg twice per week, Entresto  49-51 mg twice daily, spironolactone  12.5 mg daily Will update BMP for medication monitoring Sleep-disordered breathing STOP-BANG score of 8, likely complicated by OHS Wife reports observing snoring and occasional apneic periods No prior workup for OSA, will refer to pulmonology for further sleep apnea evaluation Morbid obesity (HCC) 426 lbs today Advised to follow-up with PCP for further weight management options Type 2 diabetes mellitus with other specified complication, unspecified whether long term insulin  use (HCC) Managed by PCP Continue insulin , metformin , atorvastatin   Hyperlipidemia, unspecified hyperlipidemia type LDL goal less than 70  lipid 05/04/2024: LDL 76 Managed by PCP Continue atorvastatin  40 mg daily Primary hypertension BPs reported well-controlled at home Averaging from 110s-130s systolic Continue carvedilol , Entresto , spironolactone  as above  Disposition: Follow-up in 6 weeks after results of ordered  testing.  Proceed to the ED with any new or worsening symptoms            Medication Adjustments/Labs and Tests Ordered: Current medicines are reviewed at length with the patient today.  Concerns regarding medicines are outlined above.  Orders Placed This Encounter  Procedures   Basic metabolic panel with GFR   Ambulatory referral to Pulmonology   LONG TERM MONITOR (3-14 DAYS)   EKG 12-Lead   ECHOCARDIOGRAM COMPLETE   No orders of the defined types were placed in this encounter.   Patient Instructions  Medication Instructions:  A referral for Pulmonology has been placed for you today.  *If you need a refill on your cardiac medications before your next appointment, please call your pharmacy*  Lab Work: BMET If you have labs (blood work) drawn today and your tests are completely  normal, you will receive your results only by: MyChart Message (if you have MyChart) OR A paper copy in the mail If you have any lab test that is abnormal or we need to change your treatment, we will call you to review the results.  Testing/Procedures: Your physician has requested that you have an echocardiogram. Echocardiography is a painless test that uses sound waves to create images of your heart. It provides your doctor with information about the size and shape of your heart and how well your hearts chambers and valves are working. This procedure takes approximately one hour. There are no restrictions for this procedure.   Please do NOT wear cologne, perfume, aftershave, or lotions (deodorant is allowed).   Please arrive 15 minutes prior to your appointment time.    ZIO XT- Long Term Monitor Instructions   Your physician has requested you wear your ZIO patch monitor 14 days.   This is a single patch monitor.  [employer] supplies one patch monitor per enrollment.  Additional stickers are not available.   Please do not apply patch if you will be having a Nuclear Stress Test, Echocardiogram,  Cardiac CT, MRI, or Chest Xray during the time frame you would be wearing the monitor. The patch cannot be worn during these tests.  You cannot remove and re-apply the ZIO XT patch monitor.   Your ZIO patch monitor will be sent USPS Priority mail from [facility] directly to your home address. The monitor may also be mailed to a PO BOX if home delivery is not available.   It may take 3-5 days to receive your monitor after you have been enrolled.   Once you have received you monitor, please review enclosed instructions.  Your monitor has already been registered assigning a specific monitor serial # to you.   Applying the monitor   Shave hair from upper left chest.   Hold abrader disc by orange tab.  Rub abrader in 40 strokes over left upper chest as indicated in your monitor instructions.   Clean area with 4 enclosed alcohol  pads .  Use all pads to assure are is cleaned thoroughly.  Let dry.   Apply patch as indicated in monitor instructions.  Patch will be place under collarbone on left side of chest with arrow pointing upward.   Rub patch adhesive wings for 2 minutes.Remove white label marked 1.  Remove white label marked 2.  Rub patch adhesive wings for 2 additional minutes.   While looking in a mirror, press and release button in center of patch.  A small green light will flash 3-4 times .  This will be your only indicator the monitor has been turned on.     Do not shower for the first 24 hours.  You may shower after the first 24 hours.   Press button if you feel a symptom. You will hear a small click.  Record Date, Time and Symptom in the Patient Log Book.   When you are ready to remove patch, follow instructions on last 2 pages of Patient Log Book.  Stick patch monitor onto last page of Patient Log Book.   Place Patient Log Book in [place] box.  Use locking tab on box and tape box closed securely.  The [employer] has [employer] on it.  Please place in mailbox  as soon as possible.  Your physician should have your test results approximately 7 days after the monitor has been mailed back to [facility].  Call [facility] Customer Care at [phone] if you have questions regarding your ZIO XT patch monitor.  Call them immediately if you see an orange light blinking on your monitor.   If your monitor falls off in less than 4 days contact our Monitor department at [phone].  If your monitor becomes loose or falls off after 4 days call [employer] at [phone] for suggestions on securing your monitor.    Follow-Up: At [facility], you and your health needs are our priority.  As part of our continuing mission to provide you with exceptional heart care, our providers are all part of one team.  This team includes your primary Cardiologist (physician) and Advanced Practice Providers or APPs (Physician Assistants and Nurse Practitioners) who all work together to provide you with the care you need, when you need it.  Your next appointment:   6 week(s)  Provider:   Miriam Shams, NP          We recommend signing up for the patient portal called MyChart.  Sign up information is provided on this After Visit Summary.  MyChart is used to connect with patients for Virtual Visits (Telemedicine).  Patients are able to view lab/test results, encounter notes, upcoming appointments, etc.  Non-urgent messages can be sent to your provider as well.   To learn more about what you can do with MyChart, go to [email or web address].   Other Instructions            Signed, Miriam FORBES Shams, NP  07/15/2024 11:00 AM    [facility]     [1]  Current Meds  Medication Sig   Alcohol  Swabs  (EASY TOUCH ALCOHOL  PREP MEDIUM) 70 % PADS Use 1 pad to cleanse finger 3 times daily   aspirin  EC 81 MG tablet Take 81 mg by mouth daily.   atorvastatin  (LIPITOR) 40 MG tablet Take 1 tablet (40 mg total) by mouth daily.   carvedilol  (COREG ) 12.5 MG  tablet Take 1 tablet (12.5 mg total) by mouth 2 (two) times daily.   furosemide  (LASIX ) 40 MG tablet Take 1 tablet (40 mg total) by mouth 3 (three) times a week.   insulin  glargine (LANTUS ) 100 UNIT/ML Solostar Pen Inject 25 Units into the skin daily.   Insulin  Pen Needle (PEN NEEDLES) 31G X 8 MM MISC as directed once a day for insulin  injection for 90 days   Insulin  Pen Needle 31G X 8 MM MISC Use as directed once a day for insulin  injection   metFORMIN  (GLUCOPHAGE ) 1000 MG tablet Take 1 tablet (1,000 mg total) by mouth 2 (two) times daily with a meal.   Multiple Vitamin (MULTI VITAMIN) TABS 1 tablet Orally Once a day; Duration: 30 day(s)   Multiple Vitamin (MULTIVITAMIN WITH MINERALS) TABS tablet Take 1 tablet by mouth every evening.    sacubitril -valsartan  (ENTRESTO ) 49-51 MG Take 1 tablet by mouth 2 (two) times daily.   "

## 2024-07-13 ENCOUNTER — Ambulatory Visit

## 2024-07-13 ENCOUNTER — Encounter: Payer: Self-pay | Admitting: Emergency Medicine

## 2024-07-13 ENCOUNTER — Ambulatory Visit: Attending: Emergency Medicine | Admitting: Emergency Medicine

## 2024-07-13 ENCOUNTER — Other Ambulatory Visit (HOSPITAL_COMMUNITY): Payer: Self-pay

## 2024-07-13 ENCOUNTER — Telehealth: Payer: Self-pay

## 2024-07-13 VITALS — BP 130/80 | HR 79 | Ht 73.0 in | Wt >= 6400 oz

## 2024-07-13 DIAGNOSIS — I471 Supraventricular tachycardia, unspecified: Secondary | ICD-10-CM

## 2024-07-13 DIAGNOSIS — I493 Ventricular premature depolarization: Secondary | ICD-10-CM

## 2024-07-13 DIAGNOSIS — E1169 Type 2 diabetes mellitus with other specified complication: Secondary | ICD-10-CM

## 2024-07-13 DIAGNOSIS — R079 Chest pain, unspecified: Secondary | ICD-10-CM

## 2024-07-13 DIAGNOSIS — I5020 Unspecified systolic (congestive) heart failure: Secondary | ICD-10-CM | POA: Diagnosis not present

## 2024-07-13 DIAGNOSIS — E785 Hyperlipidemia, unspecified: Secondary | ICD-10-CM

## 2024-07-13 DIAGNOSIS — I10 Essential (primary) hypertension: Secondary | ICD-10-CM

## 2024-07-13 DIAGNOSIS — G4730 Sleep apnea, unspecified: Secondary | ICD-10-CM

## 2024-07-13 NOTE — Progress Notes (Unsigned)
Enrolled for [employer] to mail a ZIO XT long term holter monitor to the patients address on file.   Dr. Ross to read. 

## 2024-07-13 NOTE — Patient Instructions (Addendum)
 Medication Instructions:  A referral for Pulmonology has been placed for you today.  *If you need a refill on your cardiac medications before your next appointment, please call your pharmacy*  Lab Work: BMET If you have labs (blood work) drawn today and your tests are completely normal, you will receive your results only by: MyChart Message (if you have MyChart) OR A paper copy in the mail If you have any lab test that is abnormal or we need to change your treatment, we will call you to review the results.  Testing/Procedures: Your physician has requested that you have an echocardiogram. Echocardiography is a painless test that uses sound waves to create images of your heart. It provides your doctor with information about the size and shape of your heart and how well your hearts chambers and valves are working. This procedure takes approximately one hour. There are no restrictions for this procedure.   Please do NOT wear cologne, perfume, aftershave, or lotions (deodorant is allowed).   Please arrive 15 minutes prior to your appointment time.    ZIO XT- Long Term Monitor Instructions   Your physician has requested you wear your ZIO patch monitor 14 days.   This is a single patch monitor.  [employer] supplies one patch monitor per enrollment.  Additional stickers are not available.   Please do not apply patch if you will be having a Nuclear Stress Test, Echocardiogram, Cardiac CT, MRI, or Chest Xray during the time frame you would be wearing the monitor. The patch cannot be worn during these tests.  You cannot remove and re-apply the ZIO XT patch monitor.   Your ZIO patch monitor will be sent USPS Priority mail from [facility] directly to your home address. The monitor may also be mailed to a PO BOX if home delivery is not available.   It may take 3-5 days to receive your monitor after you have been enrolled.   Once you have received you monitor, please review enclosed  instructions.  Your monitor has already been registered assigning a specific monitor serial # to you.   Applying the monitor   Shave hair from upper left chest.   Hold abrader disc by orange tab.  Rub abrader in 40 strokes over left upper chest as indicated in your monitor instructions.   Clean area with 4 enclosed alcohol  pads .  Use all pads to assure are is cleaned thoroughly.  Let dry.   Apply patch as indicated in monitor instructions.  Patch will be place under collarbone on left side of chest with arrow pointing upward.   Rub patch adhesive wings for 2 minutes.Remove white label marked 1.  Remove white label marked 2.  Rub patch adhesive wings for 2 additional minutes.   While looking in a mirror, press and release button in center of patch.  A small green light will flash 3-4 times .  This will be your only indicator the monitor has been turned on.     Do not shower for the first 24 hours.  You may shower after the first 24 hours.   Press button if you feel a symptom. You will hear a small click.  Record Date, Time and Symptom in the Patient Log Book.   When you are ready to remove patch, follow instructions on last 2 pages of Patient Log Book.  Stick patch monitor onto last page of Patient Log Book.   Place Patient Log Book in [place] box.  Use locking tab on  box and tape box closed securely.  The [employer] has [employer] on it.  Please place in mailbox as soon as possible.  Your physician should have your test results approximately 7 days after the monitor has been mailed back to [facility].   Call [facility] Customer Care at [phone] if you have questions regarding your ZIO XT patch monitor.  Call them immediately if you see an orange light blinking on your monitor.   If your monitor falls off in less than 4 days contact our Monitor department at [phone].  If your monitor becomes loose or falls off after 4 days call [employer] at [phone] for  suggestions on securing your monitor.    Follow-Up: At [facility], you and your health needs are our priority.  As part of our continuing mission to provide you with exceptional heart care, our providers are all part of one team.  This team includes your primary Cardiologist (physician) and Advanced Practice Providers or APPs (Physician Assistants and Nurse Practitioners) who all work together to provide you with the care you need, when you need it.  Your next appointment:   6 week(s)  Provider:   Miriam Shams, NP          We recommend signing up for the patient portal called MyChart.  Sign up information is provided on this After Visit Summary.  MyChart is used to connect with patients for Virtual Visits (Telemedicine).  Patients are able to view lab/test results, encounter notes, upcoming appointments, etc.  Non-urgent messages can be sent to your provider as well.   To learn more about what you can do with MyChart, go to [email or web address].   Other Instructions

## 2024-07-14 ENCOUNTER — Ambulatory Visit: Payer: Self-pay | Admitting: Emergency Medicine

## 2024-07-14 LAB — BASIC METABOLIC PANEL WITH GFR
BUN/Creatinine Ratio: 31 — ABNORMAL HIGH (ref 10–24)
BUN: 24 mg/dL (ref 8–27)
CO2: 20 mmol/L (ref 20–29)
Calcium: 9.1 mg/dL (ref 8.6–10.2)
Chloride: 106 mmol/L (ref 96–106)
Creatinine, Ser: 0.78 mg/dL (ref 0.76–1.27)
Glucose: 134 mg/dL — ABNORMAL HIGH (ref 70–99)
Potassium: 4.9 mmol/L (ref 3.5–5.2)
Sodium: 140 mmol/L (ref 134–144)
eGFR: 98 mL/min/{1.73_m2}

## 2024-07-14 NOTE — Progress Notes (Signed)
 Seen via my chart.

## 2024-07-15 NOTE — Assessment & Plan Note (Signed)
 BPs reported well-controlled at home Averaging from 110s-130s systolic Continue carvedilol , Entresto , spironolactone  as above

## 2024-07-15 NOTE — Assessment & Plan Note (Signed)
 426 lbs today Advised to follow-up with PCP for further weight management options

## 2024-07-19 NOTE — Telephone Encounter (Signed)
 PA needed for entresto  49-51

## 2024-08-24 ENCOUNTER — Ambulatory Visit (HOSPITAL_COMMUNITY)

## 2024-08-29 ENCOUNTER — Ambulatory Visit: Admitting: Emergency Medicine

## 2024-09-19 ENCOUNTER — Ambulatory Visit
